# Patient Record
Sex: Female | Born: 1960 | Race: Black or African American | Hispanic: No | Marital: Single | Smoking: Former smoker
Health system: Southern US, Community
[De-identification: ages and names within clinical notes are randomized; demographics above are authoritative.]

## PROBLEM LIST (undated history)

## (undated) DIAGNOSIS — R531 Weakness: Secondary | ICD-10-CM

## (undated) DIAGNOSIS — I639 Cerebral infarction, unspecified: Secondary | ICD-10-CM

## (undated) DIAGNOSIS — R7303 Prediabetes: Secondary | ICD-10-CM

## (undated) DIAGNOSIS — E876 Hypokalemia: Secondary | ICD-10-CM

## (undated) DIAGNOSIS — I219 Acute myocardial infarction, unspecified: Secondary | ICD-10-CM

## (undated) DIAGNOSIS — N390 Urinary tract infection, site not specified: Secondary | ICD-10-CM

## (undated) DIAGNOSIS — I5042 Chronic combined systolic (congestive) and diastolic (congestive) heart failure: Secondary | ICD-10-CM

## (undated) DIAGNOSIS — E785 Hyperlipidemia, unspecified: Secondary | ICD-10-CM

## (undated) DIAGNOSIS — C801 Malignant (primary) neoplasm, unspecified: Secondary | ICD-10-CM

## (undated) DIAGNOSIS — J9601 Acute respiratory failure with hypoxia: Secondary | ICD-10-CM

## (undated) DIAGNOSIS — I10 Essential (primary) hypertension: Secondary | ICD-10-CM

## (undated) DIAGNOSIS — N179 Acute kidney failure, unspecified: Secondary | ICD-10-CM

## (undated) HISTORY — DX: Acute myocardial infarction, unspecified: I21.9

## (undated) HISTORY — PX: OTHER SURGICAL HISTORY: SHX169

## (undated) HISTORY — DX: Weakness: R53.1

## (undated) HISTORY — DX: Hypokalemia: E87.6

## (undated) HISTORY — DX: Acute kidney failure, unspecified: N17.9

## (undated) HISTORY — DX: Acute respiratory failure with hypoxia: J96.01

## (undated) HISTORY — DX: Malignant (primary) neoplasm, unspecified: C80.1

## (undated) HISTORY — DX: Urinary tract infection, site not specified: N39.0

## (undated) HISTORY — DX: Chronic combined systolic (congestive) and diastolic (congestive) heart failure: I50.42

## (undated) HISTORY — DX: Cerebral infarction, unspecified: I63.9

---

## 2015-07-31 DIAGNOSIS — H25011 Cortical age-related cataract, right eye: Secondary | ICD-10-CM | POA: Insufficient documentation

## 2015-07-31 DIAGNOSIS — H43811 Vitreous degeneration, right eye: Secondary | ICD-10-CM

## 2015-07-31 DIAGNOSIS — H2513 Age-related nuclear cataract, bilateral: Secondary | ICD-10-CM | POA: Insufficient documentation

## 2015-07-31 DIAGNOSIS — H40053 Ocular hypertension, bilateral: Secondary | ICD-10-CM

## 2015-07-31 DIAGNOSIS — H4031X4 Glaucoma secondary to eye trauma, right eye, indeterminate stage: Secondary | ICD-10-CM

## 2015-07-31 HISTORY — DX: Cortical age-related cataract, right eye: H25.011

## 2015-07-31 HISTORY — DX: Vitreous degeneration, right eye: H43.811

## 2015-07-31 HISTORY — DX: Age-related nuclear cataract, bilateral: H25.13

## 2015-07-31 HISTORY — DX: Ocular hypertension, bilateral: H40.053

## 2015-07-31 HISTORY — DX: Glaucoma secondary to eye trauma, right eye, indeterminate stage: H40.31X4

## 2020-03-24 DIAGNOSIS — I6529 Occlusion and stenosis of unspecified carotid artery: Secondary | ICD-10-CM

## 2020-03-24 HISTORY — DX: Occlusion and stenosis of unspecified carotid artery: I65.29

## 2020-09-22 ENCOUNTER — Emergency Department (HOSPITAL_COMMUNITY): Admit: 2020-09-22 | Payer: Self-pay | Admitting: Cardiology

## 2020-09-22 ENCOUNTER — Encounter (HOSPITAL_COMMUNITY): Payer: Self-pay | Admitting: Cardiology

## 2020-09-22 ENCOUNTER — Inpatient Hospital Stay (HOSPITAL_COMMUNITY): Payer: Medicaid Other

## 2020-09-22 ENCOUNTER — Inpatient Hospital Stay (HOSPITAL_COMMUNITY)
Admission: EM | Admit: 2020-09-22 | Discharge: 2020-09-24 | DRG: 246 | Disposition: A | Discharge status: Home / Self Care | Payer: Medicaid Other | Source: Other Acute Inpatient Hospital | Attending: Cardiology | Admitting: Cardiology

## 2020-09-22 ENCOUNTER — Encounter (HOSPITAL_COMMUNITY)
Admission: EM | Disposition: A | Discharge status: Home / Self Care | Payer: Self-pay | Source: Other Acute Inpatient Hospital | Attending: Cardiology

## 2020-09-22 DIAGNOSIS — E871 Hypo-osmolality and hyponatremia: Secondary | ICD-10-CM | POA: Diagnosis present

## 2020-09-22 DIAGNOSIS — E119 Type 2 diabetes mellitus without complications: Secondary | ICD-10-CM

## 2020-09-22 DIAGNOSIS — Z95820 Peripheral vascular angioplasty status with implants and grafts: Secondary | ICD-10-CM

## 2020-09-22 DIAGNOSIS — Z955 Presence of coronary angioplasty implant and graft: Secondary | ICD-10-CM

## 2020-09-22 DIAGNOSIS — I5041 Acute combined systolic (congestive) and diastolic (congestive) heart failure: Secondary | ICD-10-CM | POA: Diagnosis present

## 2020-09-22 DIAGNOSIS — Z8249 Family history of ischemic heart disease and other diseases of the circulatory system: Secondary | ICD-10-CM

## 2020-09-22 DIAGNOSIS — I255 Ischemic cardiomyopathy: Secondary | ICD-10-CM | POA: Diagnosis present

## 2020-09-22 DIAGNOSIS — E785 Hyperlipidemia, unspecified: Secondary | ICD-10-CM

## 2020-09-22 DIAGNOSIS — F17210 Nicotine dependence, cigarettes, uncomplicated: Secondary | ICD-10-CM | POA: Diagnosis present

## 2020-09-22 DIAGNOSIS — Z720 Tobacco use: Secondary | ICD-10-CM | POA: Diagnosis present

## 2020-09-22 DIAGNOSIS — I110 Hypertensive heart disease with heart failure: Secondary | ICD-10-CM | POA: Diagnosis present

## 2020-09-22 DIAGNOSIS — I2102 ST elevation (STEMI) myocardial infarction involving left anterior descending coronary artery: Principal | ICD-10-CM

## 2020-09-22 DIAGNOSIS — E1165 Type 2 diabetes mellitus with hyperglycemia: Secondary | ICD-10-CM | POA: Diagnosis present

## 2020-09-22 DIAGNOSIS — Z20822 Contact with and (suspected) exposure to covid-19: Secondary | ICD-10-CM | POA: Diagnosis present

## 2020-09-22 DIAGNOSIS — R9389 Abnormal findings on diagnostic imaging of other specified body structures: Secondary | ICD-10-CM

## 2020-09-22 DIAGNOSIS — I2510 Atherosclerotic heart disease of native coronary artery without angina pectoris: Secondary | ICD-10-CM

## 2020-09-22 DIAGNOSIS — R7401 Elevation of levels of liver transaminase levels: Secondary | ICD-10-CM | POA: Diagnosis present

## 2020-09-22 DIAGNOSIS — I10 Essential (primary) hypertension: Secondary | ICD-10-CM

## 2020-09-22 HISTORY — DX: Hyperlipidemia, unspecified: E78.5

## 2020-09-22 HISTORY — DX: Essential (primary) hypertension: I10

## 2020-09-22 HISTORY — PX: CORONARY/GRAFT ACUTE MI REVASCULARIZATION: CATH118305

## 2020-09-22 HISTORY — DX: Prediabetes: R73.03

## 2020-09-22 HISTORY — DX: ST elevation (STEMI) myocardial infarction involving left anterior descending coronary artery: I21.02

## 2020-09-22 HISTORY — PX: LEFT HEART CATH AND CORONARY ANGIOGRAPHY: CATH118249

## 2020-09-22 LAB — RESP PANEL BY RT-PCR (FLU A&B, COVID) ARPGX2
Influenza A by PCR: NEGATIVE
Influenza B by PCR: NEGATIVE
SARS Coronavirus 2 by RT PCR: NEGATIVE

## 2020-09-22 LAB — POCT I-STAT, CHEM 8
BUN: 18 mg/dL (ref 6–20)
Calcium, Ion: 1.19 mmol/L (ref 1.15–1.40)
Chloride: 103 mmol/L (ref 98–111)
Creatinine, Ser: 0.8 mg/dL (ref 0.44–1.00)
Glucose, Bld: 264 mg/dL — ABNORMAL HIGH (ref 70–99)
HCT: 41 % (ref 36.0–46.0)
Hemoglobin: 13.9 g/dL (ref 12.0–15.0)
Potassium: 3.8 mmol/L (ref 3.5–5.1)
Sodium: 137 mmol/L (ref 135–145)
TCO2: 22 mmol/L (ref 22–32)

## 2020-09-22 LAB — HEMOGLOBIN A1C
Hgb A1c MFr Bld: 6.9 % — ABNORMAL HIGH (ref 4.8–5.6)
Mean Plasma Glucose: 151.33 mg/dL

## 2020-09-22 LAB — POCT ACTIVATED CLOTTING TIME
Activated Clotting Time: 561 seconds
Activated Clotting Time: 822 seconds

## 2020-09-22 LAB — GLUCOSE, CAPILLARY
Glucose-Capillary: 184 mg/dL — ABNORMAL HIGH (ref 70–99)
Glucose-Capillary: 218 mg/dL — ABNORMAL HIGH (ref 70–99)
Glucose-Capillary: 197 mg/dL — ABNORMAL HIGH (ref 70–99)

## 2020-09-22 LAB — CBC
HCT: 41 % (ref 36.0–46.0)
Hemoglobin: 13.9 g/dL (ref 12.0–15.0)
MCH: 31.6 pg (ref 26.0–34.0)
MCHC: 33.9 g/dL (ref 30.0–36.0)
MCV: 93.2 fL (ref 80.0–100.0)
Platelets: 337 10*3/uL (ref 150–400)
RBC: 4.4 MIL/uL (ref 3.87–5.11)
RDW: 13.8 % (ref 11.5–15.5)
WBC: 11.7 10*3/uL — ABNORMAL HIGH (ref 4.0–10.5)
nRBC: 0 % (ref 0.0–0.2)

## 2020-09-22 LAB — ECHOCARDIOGRAM COMPLETE
Area-P 1/2: 6.65 cm2
S' Lateral: 4.4 cm
Single Plane A4C EF: 42.4 %

## 2020-09-22 LAB — CREATININE, SERUM
Creatinine, Ser: 1.1 mg/dL — ABNORMAL HIGH (ref 0.44–1.00)
GFR, Estimated: 58 mL/min — ABNORMAL LOW (ref >60)

## 2020-09-22 LAB — TROPONIN I (HIGH SENSITIVITY): Troponin I (High Sensitivity): >24000 ng/L (ref <18)

## 2020-09-22 LAB — HIV ANTIBODY (ROUTINE TESTING W REFLEX): HIV Screen 4th Generation wRfx: NONREACTIVE

## 2020-09-22 LAB — MRSA NEXT GEN BY PCR, NASAL: MRSA by PCR Next Gen: NOT DETECTED

## 2020-09-22 SURGERY — CORONARY/GRAFT ACUTE MI REVASCULARIZATION
Anesthesia: LOCAL

## 2020-09-22 MED ORDER — HEPARIN (PORCINE) IN NACL 1000-0.9 UT/500ML-% IV SOLN
INTRAVENOUS | Status: DC | PRN
  Administered 2020-09-22 (×2): 500 mL

## 2020-09-22 MED ORDER — LIDOCAINE HCL (PF) 1 % IJ SOLN
INTRAMUSCULAR | Status: AC
  Filled 2020-09-22: qty 30

## 2020-09-22 MED ORDER — ACETAMINOPHEN 325 MG PO TABS
650.0000 mg | ORAL_TABLET | ORAL | Status: DC | PRN
  Administered 2020-09-22 – 2020-09-23 (×3): 650 mg via ORAL
  Filled 2020-09-22 (×3): qty 2

## 2020-09-22 MED ORDER — HEPARIN (PORCINE) IN NACL 1000-0.9 UT/500ML-% IV SOLN
INTRAVENOUS | Status: AC
  Filled 2020-09-22: qty 1000

## 2020-09-22 MED ORDER — NITROGLYCERIN IN D5W 200-5 MCG/ML-% IV SOLN: 0.0000 ug/min | INTRAVENOUS | Status: DC

## 2020-09-22 MED ORDER — ONDANSETRON HCL 4 MG/2ML IJ SOLN: 4.0000 mg | Freq: Four times a day (QID) | INTRAMUSCULAR | Status: DC | PRN

## 2020-09-22 MED ORDER — HYDRALAZINE HCL 20 MG/ML IJ SOLN
10.0000 mg | INTRAMUSCULAR | Status: AC | PRN
  Administered 2020-09-22 (×2): 10 mg via INTRAVENOUS
  Filled 2020-09-22: qty 1

## 2020-09-22 MED ORDER — LIDOCAINE HCL (PF) 1 % IJ SOLN
INTRAMUSCULAR | Status: DC | PRN
  Administered 2020-09-22: 2 mL

## 2020-09-22 MED ORDER — VERAPAMIL HCL 2.5 MG/ML IV SOLN
INTRAVENOUS | Status: AC
  Filled 2020-09-22: qty 2

## 2020-09-22 MED ORDER — ASPIRIN EC 81 MG PO TBEC
81.0000 mg | DELAYED_RELEASE_TABLET | Freq: Every day | ORAL | Status: DC
  Administered 2020-09-23 – 2020-09-24 (×2): 81 mg via ORAL
  Filled 2020-09-22 (×2): qty 1

## 2020-09-22 MED ORDER — ALUM & MAG HYDROXIDE-SIMETH 200-200-20 MG/5ML PO SUSP
30.0000 mL | ORAL | Status: DC | PRN
  Administered 2020-09-22 – 2020-09-24 (×4): 30 mL via ORAL
  Filled 2020-09-22 (×4): qty 30

## 2020-09-22 MED ORDER — NITROGLYCERIN 1 MG/10 ML FOR IR/CATH LAB
INTRA_ARTERIAL | Status: DC | PRN
Start: 2020-09-22 — End: 2020-09-22
  Administered 2020-09-22 (×3): 200 ug via INTRACORONARY

## 2020-09-22 MED ORDER — HEPARIN SODIUM (PORCINE) 1000 UNIT/ML IJ SOLN
INTRAMUSCULAR | Status: AC
  Filled 2020-09-22: qty 1

## 2020-09-22 MED ORDER — IOHEXOL 350 MG/ML SOLN
INTRAVENOUS | Status: DC | PRN
  Administered 2020-09-22: 100 mL

## 2020-09-22 MED ORDER — NITROGLYCERIN IN D5W 200-5 MCG/ML-% IV SOLN
INTRAVENOUS | Status: AC
  Filled 2020-09-22: qty 250

## 2020-09-22 MED ORDER — CHLORHEXIDINE GLUCONATE CLOTH 2 % EX PADS
6.0000 | MEDICATED_PAD | Freq: Every day | CUTANEOUS | Status: DC
  Administered 2020-09-23: 6 via TOPICAL

## 2020-09-22 MED ORDER — HEPARIN SODIUM (PORCINE) 5000 UNIT/ML IJ SOLN: 5000.0000 [IU] | Freq: Three times a day (TID) | INTRAMUSCULAR | Status: DC

## 2020-09-22 MED ORDER — SODIUM CHLORIDE 0.9% FLUSH
3.0000 mL | Freq: Two times a day (BID) | INTRAVENOUS | Status: DC
  Administered 2020-09-22 – 2020-09-24 (×5): 3 mL via INTRAVENOUS

## 2020-09-22 MED ORDER — VERAPAMIL HCL 2.5 MG/ML IV SOLN
INTRAVENOUS | Status: DC | PRN
  Administered 2020-09-22: 10 mL via INTRA_ARTERIAL

## 2020-09-22 MED ORDER — INSULIN ASPART 100 UNIT/ML IJ SOLN
0.0000 [IU] | Freq: Three times a day (TID) | INTRAMUSCULAR | Status: DC
  Administered 2020-09-22: 3 [IU] via SUBCUTANEOUS
  Administered 2020-09-22 – 2020-09-23 (×4): 2 [IU] via SUBCUTANEOUS
  Administered 2020-09-24: 1 [IU] via SUBCUTANEOUS

## 2020-09-22 MED ORDER — CARVEDILOL 12.5 MG PO TABS
12.5000 mg | ORAL_TABLET | Freq: Two times a day (BID) | ORAL | Status: DC
  Administered 2020-09-22 – 2020-09-24 (×4): 12.5 mg via ORAL
  Filled 2020-09-22 (×4): qty 1

## 2020-09-22 MED ORDER — HEPARIN SODIUM (PORCINE) 1000 UNIT/ML IJ SOLN
INTRAMUSCULAR | Status: DC | PRN
Start: 2020-09-22 — End: 2020-09-22
  Administered 2020-09-22: 8000 [IU] via INTRAVENOUS

## 2020-09-22 MED ORDER — SODIUM CHLORIDE 0.9 % IV SOLN: INTRAVENOUS | Status: AC

## 2020-09-22 MED ORDER — HEPARIN SODIUM (PORCINE) 5000 UNIT/ML IJ SOLN
5000.0000 [IU] | Freq: Three times a day (TID) | INTRAMUSCULAR | Status: DC
  Administered 2020-09-22 – 2020-09-24 (×5): 5000 [IU] via SUBCUTANEOUS
  Filled 2020-09-22 (×5): qty 1

## 2020-09-22 MED ORDER — TICAGRELOR 90 MG PO TABS
ORAL_TABLET | ORAL | Status: DC | PRN
  Administered 2020-09-22: 180 mg via ORAL

## 2020-09-22 MED ORDER — TICAGRELOR 90 MG PO TABS
90.0000 mg | ORAL_TABLET | Freq: Two times a day (BID) | ORAL | Status: DC
  Administered 2020-09-22 – 2020-09-24 (×4): 90 mg via ORAL
  Filled 2020-09-22 (×4): qty 1

## 2020-09-22 MED ORDER — NITROGLYCERIN 0.4 MG SL SUBL: 0.4000 mg | SUBLINGUAL_TABLET | SUBLINGUAL | Status: DC | PRN

## 2020-09-22 MED ORDER — LABETALOL HCL 5 MG/ML IV SOLN
10.0000 mg | INTRAVENOUS | Status: AC | PRN
  Administered 2020-09-22 (×2): 10 mg via INTRAVENOUS
  Filled 2020-09-22 (×2): qty 4

## 2020-09-22 MED ORDER — NITROGLYCERIN IN D5W 200-5 MCG/ML-% IV SOLN
INTRAVENOUS | Status: DC | PRN
  Administered 2020-09-22: 10 ug/min via INTRAVENOUS

## 2020-09-22 MED ORDER — SODIUM CHLORIDE 0.9% FLUSH: 3.0000 mL | INTRAVENOUS | Status: DC | PRN

## 2020-09-22 MED ORDER — NITROGLYCERIN 1 MG/10 ML FOR IR/CATH LAB
INTRA_ARTERIAL | Status: AC
  Filled 2020-09-22: qty 10

## 2020-09-22 MED ORDER — ATORVASTATIN CALCIUM 80 MG PO TABS
80.0000 mg | ORAL_TABLET | Freq: Every day | ORAL | Status: DC
  Administered 2020-09-22 – 2020-09-23 (×2): 80 mg via ORAL
  Filled 2020-09-22 (×2): qty 1

## 2020-09-22 MED ORDER — SODIUM CHLORIDE 0.9 % IV SOLN: 250.0000 mL | INTRAVENOUS | Status: DC | PRN

## 2020-09-22 SURGICAL SUPPLY — 15 items
BALLN SAPPHIRE 2.0X12 (BALLOONS) ×2
BALLN SAPPHIRE ~~LOC~~ 3.0X15 (BALLOONS) ×2 IMPLANT
BALLOON SAPPHIRE 2.0X12 (BALLOONS) ×1 IMPLANT
CATH INFINITI JR4 5F (CATHETERS) ×2 IMPLANT
CATH VISTA GUIDE 6FR XBLAD3.5 (CATHETERS) ×2 IMPLANT
DEVICE RAD TR BAND REGULAR (VASCULAR PRODUCTS) ×2 IMPLANT
GLIDESHEATH SLEND SS 6F .021 (SHEATH) ×2 IMPLANT
KIT HEART LEFT (KITS) ×2 IMPLANT
PACK CARDIAC CATHETERIZATION (CUSTOM PROCEDURE TRAY) ×2 IMPLANT
STENT ONYX FRONTIER 2.5X38 (Permanent Stent) ×2 IMPLANT
TRANSDUCER W/STOPCOCK (MISCELLANEOUS) ×2 IMPLANT
TUBING CIL FLEX 10 FLL-RA (TUBING) ×2 IMPLANT
WIRE ASAHI PROWATER 180CM (WIRE) ×2 IMPLANT
WIRE HI TORQ VERSACORE J 260CM (WIRE) ×2 IMPLANT
WIRE STARTER ROSEN .035X260 (WIRE) ×2 IMPLANT

## 2020-09-22 NOTE — Progress Notes (Signed)
1100-1130 Reviewed pt's chart. Put in CRP 2 order for [place]. Left MI book, heart healthy diet, tobacco cessation handout at bedside. Pt is fast asleep and just had PCI.  Will continue to follow. Graylon Good RN BSN 09/22/2020 11:32 AM

## 2020-09-22 NOTE — H&P (Signed)
Cardiology Admission History and Physical:   Patient ID: Tracey Meyer MRN: [id number]; DOB: [birth year]   Admission date: 09/22/2020  PCP:  Pcp, No   [facility] Providers Cardiologist:  None   New     Chief Complaint:  upper abdominal pain  Patient Profile:   Tracey Meyer is a 60 y.o. female with history of HTN, HLD untreated  who is transferred from [facility] ED 09/22/2020 for the evaluation of anterior STEMI.  History of Present Illness:   Tracey Meyer has a history of HTN, HLD, and "borderline DM". Hasn't seen a doctor in years and has not been on any medication. Yesterday 7 pm she developed severe upper abdominal pain associated with nausea, vomiting, diarrhea and diaphoresis. Pain was constant and unremitting. Presented to [facility] hospital ED this am and Ecg was consistent with anterior STEMI. She was severely hypertensive with BP 215/110. Given ASA, IV heparin, IV lopressor and transferred here for emergent cardiac cath. Denies any prior history of heart disease. She does smoke 1/2 pk/day. States her brother has some type of heart disease.   Past Medical History:  Diagnosis Date   HTN (hypertension)    Hyperlipidemia    Prediabetes     Past Surgical History:  Procedure Laterality Date   BREAST SURGERY Left    age 61     Medications Prior to Admission: Prior to Admission medications   Not on File     Allergies:   Not on File  Social History:   Social History   Socioeconomic History   Marital status: Single    Spouse name: Not on file   Number of children: Not on file   Years of education: Not on file   Highest education level: Not on file  Occupational History   Not on file  Tobacco Use   Smoking status: Every Day    Packs/day: 0.50    Pack years: 0.00    Types: Cigarettes   Smokeless tobacco: Never  Substance and Sexual Activity   Alcohol use: Not on file   Drug use: Never   Sexual activity: Not on file  Other Topics Concern    Not on file  Social History Narrative   Not on file   Social Determinants of Health   Financial Resource Strain: Not on file  Food Insecurity: Not on file  Transportation Needs: Not on file  Physical Activity: Not on file  Stress: Not on file  Social Connections: Not on file  Intimate Partner Violence: Not on file    Family History:   The patient's family history includes Cancer in her mother; Heart disease in her brother.    ROS:  Please see the history of present illness.  All other ROS reviewed and negative.     Physical Exam/Data:   Vitals:   09/22/20 1016 09/22/20 1021 09/22/20 1025 09/22/20 1030  BP: (!) 180/99     Pulse: 85 (!) 0 (!) 224 (!) 115  Resp: (!) 26 (!) 0 (!) 0 (!) 0  SpO2: 93% (!) 0% (!) 0% (!) 0%   No intake or output data in the 24 hours ending 09/22/20 1036 No flowsheet data found.   There is no height or weight on file to calculate BMI.  General:  Well nourished, well developed, in no acute distress HEENT: normal Lymph: no adenopathy Neck: no JVD Endocrine:  No thryomegaly Vascular: No carotid bruits; FA pulses 2+ bilaterally without bruits  Cardiac:  normal S1, S2; RRR;  no murmur  Lungs:  clear to auscultation bilaterally, no wheezing, rhonchi or rales  Abd: soft, nontender, no hepatomegaly  Ext: no edema Musculoskeletal:  No deformities, BUE and BLE strength normal and equal Skin: warm and dry  Neuro:  CNs 2-12 intact, no focal abnormalities noted Psych:  Normal affect    EKG:  The ECG that was done today was personally reviewed and demonstrates NSR with LVH, ST elevation 2 mm in leads 1 and 2, 4 mm ST elevation V 2-5 c/w anterior STEMI  Relevant CV Studies: none  Laboratory Data:  High Sensitivity Troponin:  No results for input(s): TROPONINIHS in the last 720 hours.    Chemistry Recent Labs  Lab 09/22/20 0940  NA 137  K 3.8  CL 103  GLUCOSE 264*  BUN 18  CREATININE 0.80    No results for input(s): PROT, ALBUMIN, AST, ALT,  ALKPHOS, BILITOT in the last 168 hours. Hematology Recent Labs  Lab 09/22/20 0940  HGB 13.9  HCT 41.0   BNPNo results for input(s): BNP, PROBNP in the last 168 hours.  DDimer No results for input(s): DDIMER in the last 168 hours.   Radiology/Studies:  CARDIAC CATHETERIZATION  Result Date: 09/22/2020  RPDA lesion is 50% stenosed.  Dist Cx lesion is 30% stenosed with 30% stenosed side branch in LPAV.  1st Diag lesion is 60% stenosed.  Mid LAD lesion is 100% stenosed.  Post intervention, there is a 0% residual stenosis.  A drug-eluting stent was successfully placed using a STENT ONYX FRONTIER 2.5X38.  LV end diastolic pressure is mildly elevated.  1. 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 Mildly elevated LVEDP 21 mmHg 3. Successful PCI of the mid LAD with DES x 1 Plan: DAPT for one year. I suspect she will have significant myocardial injury given late presentation. Will check Echo. Aggressive risk factor modification.     Assessment and Plan:   Acute anterior STEMI. Unfortunately she is presenting late- 14 hours after onset of pain. She has marked ST elevation in the precordial leads with Q waves. She is having continued pain. Will proceed directly to emergent cardiac cath with likely PCI. Will need DAPT, beta blocker, and statin.  Severe uncontrolled HTN. Will initiate beta blocker and nitrate therapy. Consider adding ARB if renal function is Ok. May need to consider Entresto if EF is severely reduced. Hyperglycemia with history of "borderline DM". Will check A1c. SSI Hyperlipidemia. Check lipid panel. Start high dose lipitor Tobacco abuse. Counsel on smoking cessation.    Risk Assessment/Risk Scores:    TIMI Risk Score for ST  Elevation MI:   The patient's TIMI risk score is 3, which indicates a 4.4% risk of all cause mortality at 30 days.        Severity of Illness: The appropriate patient status for this patient is INPATIENT. Inpatient status is judged  to be reasonable and necessary in order to provide the required intensity of service to ensure the patient's safety. The patient's presenting symptoms, physical exam findings, and initial radiographic and laboratory data in the context of their chronic comorbidities is felt to place them at high risk for further clinical deterioration. Furthermore, it is not anticipated that the patient will be medically stable for discharge from the hospital within 2 midnights of admission. The following factors support the patient status of inpatient.   " The patient's presenting symptoms include upper abdominal pain. " The worrisome physical exam findings include severe HTN. " The initial  radiographic and laboratory data are worrisome because of ST elevation on Ecg. " The chronic co-morbidities include HTN, hyperglycemia, HLD, tobacco abuse.   * I certify that at the point of admission it is my clinical judgment that the patient will require inpatient hospital care spanning beyond 2 midnights from the point of admission due to high intensity of service, high risk for further deterioration and high frequency of surveillance required.*   For questions or updates, please contact [place] Please consult [email or web address] for contact info under     Signed, [place] Grayer Martinique, MD  09/22/2020 10:36 AM

## 2020-09-22 NOTE — Plan of Care (Signed)
Post cath lab education ongoing for patient

## 2020-09-22 NOTE — Progress Notes (Signed)
  Echocardiogram 2D Echocardiogram has been performed.  Tracey Meyer 09/22/2020, 12:16 PM

## 2020-09-22 NOTE — Progress Notes (Signed)
   09/22/20 0834  Clinical Encounter Type  Visited With Patient not available  Visit Type Other (Comment) (Code Stemi)  Referral From Nurse  Consult/Referral To Chaplain    Chaplain responded to code stemi. Patient is not available and Chaplain remains available if needed. This note was prepared by Jeanine Luz, M.Div..  For questions please contact by phone [phone].

## 2020-09-23 ENCOUNTER — Inpatient Hospital Stay (HOSPITAL_COMMUNITY): Payer: Medicaid Other

## 2020-09-23 LAB — CBC
HCT: 38.3 % (ref 36.0–46.0)
Hemoglobin: 12.7 g/dL (ref 12.0–15.0)
MCH: 30.9 pg (ref 26.0–34.0)
MCHC: 33.2 g/dL (ref 30.0–36.0)
MCV: 93.2 fL (ref 80.0–100.0)
Platelets: 249 10*3/uL (ref 150–400)
RBC: 4.11 MIL/uL (ref 3.87–5.11)
RDW: 13.8 % (ref 11.5–15.5)
WBC: 12.9 10*3/uL — ABNORMAL HIGH (ref 4.0–10.5)
nRBC: 0 % (ref 0.0–0.2)

## 2020-09-23 LAB — BASIC METABOLIC PANEL
Anion gap: 10 (ref 5–15)
BUN: 28 mg/dL — ABNORMAL HIGH (ref 6–20)
CO2: 22 mmol/L (ref 22–32)
Calcium: 9.7 mg/dL (ref 8.9–10.3)
Chloride: 100 mmol/L (ref 98–111)
Creatinine, Ser: 1.14 mg/dL — ABNORMAL HIGH (ref 0.44–1.00)
GFR, Estimated: 55 mL/min — ABNORMAL LOW (ref >60)
Glucose, Bld: 194 mg/dL — ABNORMAL HIGH (ref 70–99)
Potassium: 3.9 mmol/L (ref 3.5–5.1)
Sodium: 132 mmol/L — ABNORMAL LOW (ref 135–145)

## 2020-09-23 LAB — LIPID PANEL
Cholesterol: 234 mg/dL — ABNORMAL HIGH (ref 0–200)
HDL: 79 mg/dL (ref >40)
LDL Cholesterol: 138 mg/dL — ABNORMAL HIGH (ref 0–99)
Total CHOL/HDL Ratio: 3 RATIO
Triglycerides: 87 mg/dL (ref <150)
VLDL: 17 mg/dL (ref 0–40)

## 2020-09-23 LAB — GLUCOSE, CAPILLARY
Glucose-Capillary: 176 mg/dL — ABNORMAL HIGH (ref 70–99)
Glucose-Capillary: 170 mg/dL — ABNORMAL HIGH (ref 70–99)

## 2020-09-23 IMAGING — CR DG CHEST 2V
2 series · 2 of 2 positions shown · non-contrast
Comparison: Radiograph yesterday at [facility].

CLINICAL DATA: Question right lung pneumonia on outside radiograph.

EXAM:
CHEST - 2 VIEW

[chest lat]
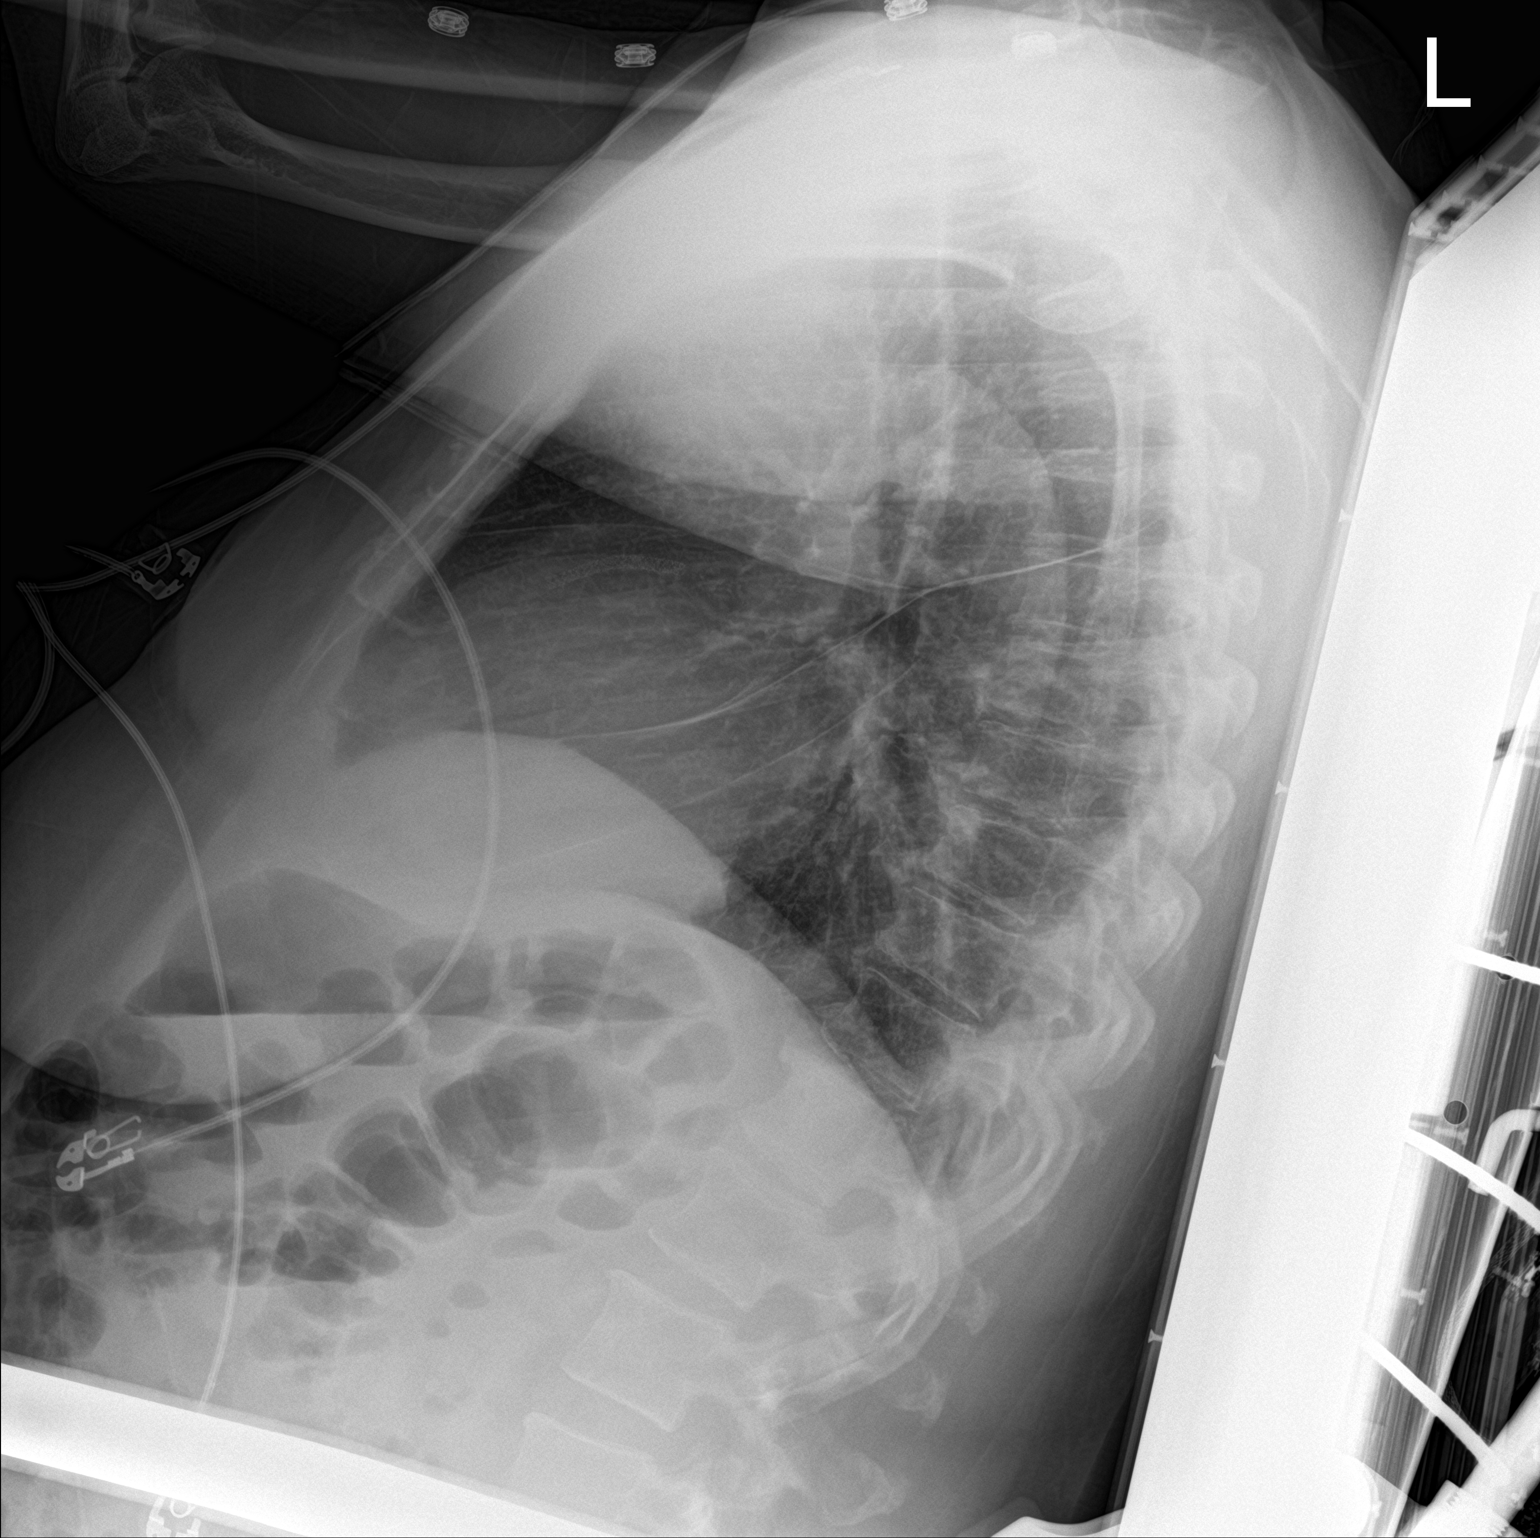

[chest ap]
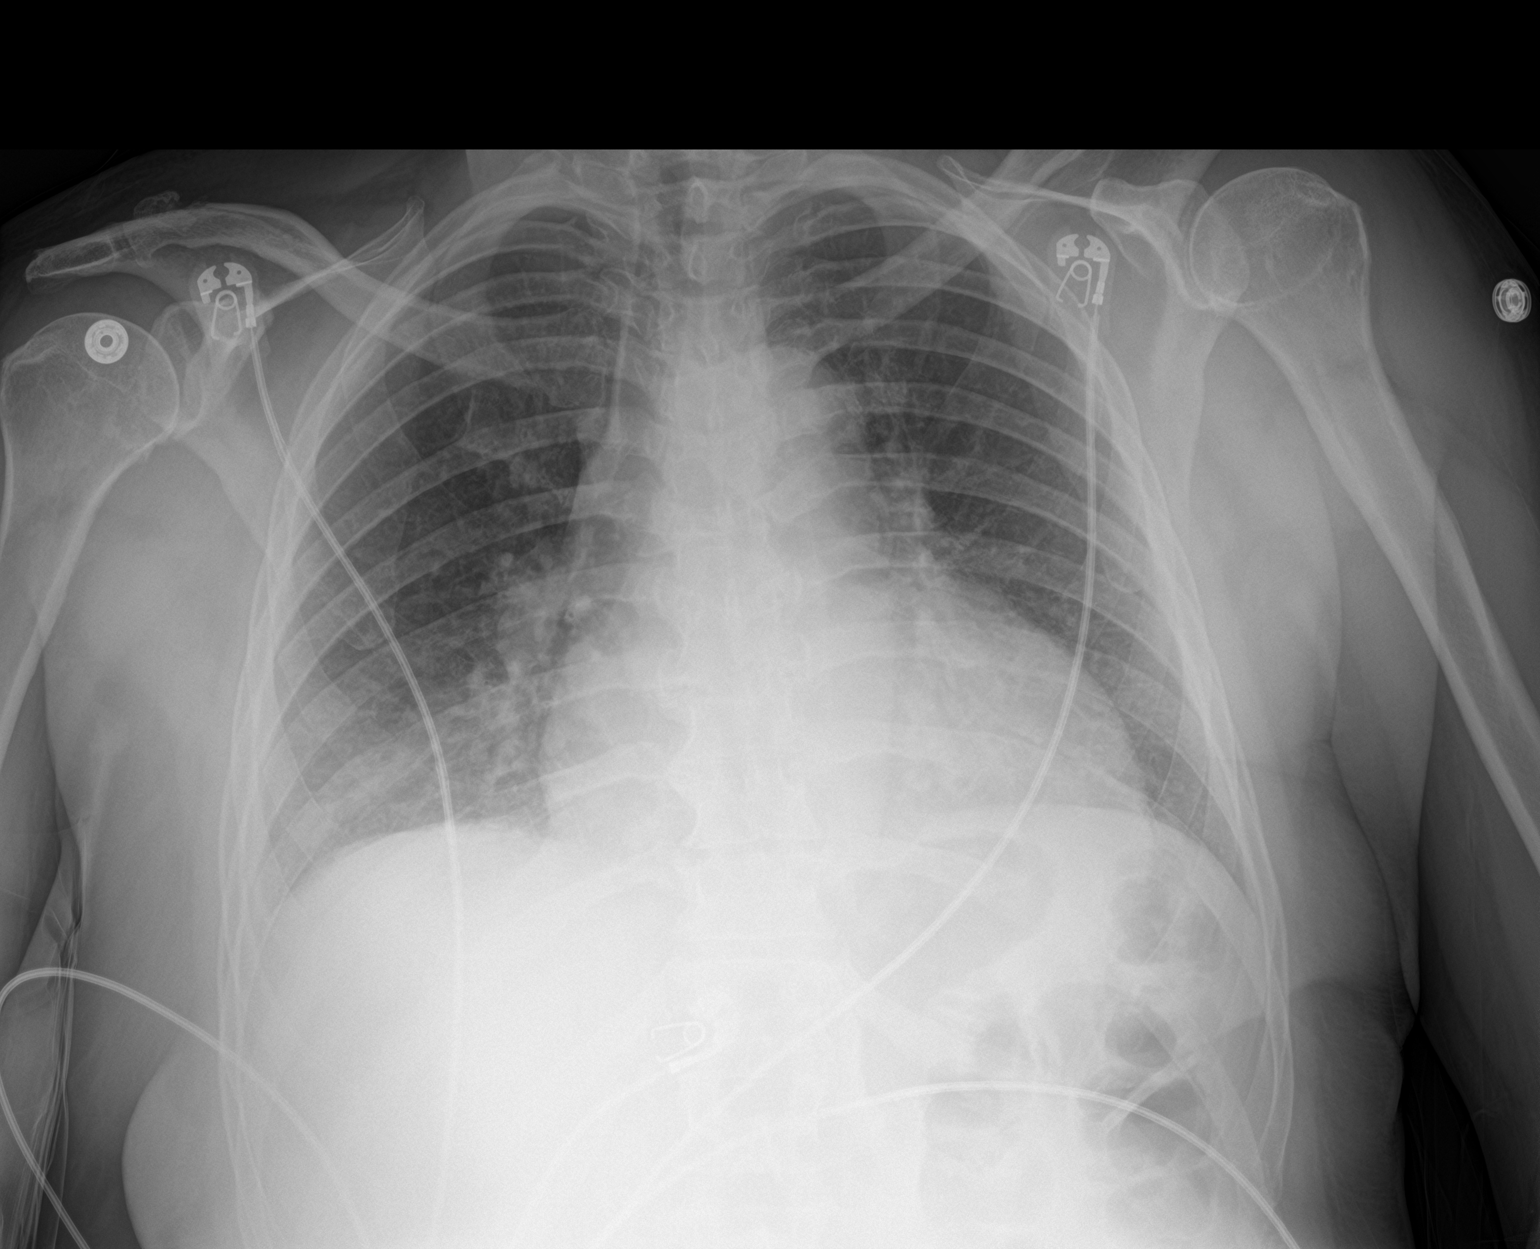

[2 of 2 positions shown; findings below may reference images not displayed]

FINDINGS: Lung volumes are low. Borderline cardiomegaly is similar. There is
improved right basilar aeration with mild residual atelectasis. No
new airspace disease. There is minimal fluid in the fissures without
large subpulmonic effusion. No pneumothorax. Stable osseous
structures.
IMPRESSION: 1. Improved aeration at the right lung base with mild residual
atelectasis. No new airspace disease.
2. Trace fluid in the fissures.  Borderline cardiomegaly.

## 2020-09-23 MED ORDER — SPIRONOLACTONE 25 MG PO TABS
25.0000 mg | ORAL_TABLET | Freq: Every day | ORAL | Status: DC
  Administered 2020-09-23 – 2020-09-24 (×2): 25 mg via ORAL
  Filled 2020-09-23 (×2): qty 1

## 2020-09-23 MED ORDER — SACUBITRIL-VALSARTAN 24-26 MG PO TABS
1.0000 | ORAL_TABLET | Freq: Two times a day (BID) | ORAL | Status: DC
  Administered 2020-09-23 – 2020-09-24 (×3): 1 via ORAL
  Filled 2020-09-23 (×3): qty 1

## 2020-09-23 NOTE — Progress Notes (Addendum)
Dr. Martinique had asked me to f/u on the labs sent over by [facility] ER yesterday. Records reviewed. CBC and coags are available, no acute abnormalities. WBC 9.4, Hgb 15.1, Plt 348, PT/PTT/INR WNL. CMET/lipase were pending at the time of her transfer here per the report available and CXR result was not sent. She had a CT of the abdomen listed as "ordered" at their facility since presenting complaint was abdominal pain, but upon recognition of STEMI, this was deferred (confirmed with [facility] ER this AM it was not performed, so review of this result is not needed). [facility] team will fax to cath lab.  Addendum: Additional results  reviewed. CMET 09/22/20 showed Na 135, K 4.1, Cr 0.80, BUN 18, glucose 253, calcium 10.2, albumin 4.9, TProtein 9.2, Troponin I - 114, NT-PBNP 6810, TBili 0.7, AST 628, ALT 84, Alk phos 101, Lipase 52. Portable CXR 09/22/20: low lung volumes, patchy airspace disease at the right base compatible with PNA. Cardiopericardial silhouette is at the upper limits of normal for size. The visualized bony structures of the thorax show no acute abnormality.  Per d/w Dr. Percival Spanish- - hold statin further with repeat LFTs ordered in AM. Abnormal AST may be 2/2 to acute MI but await improvement - repeat CXR as 2V to clarify finding, but clinically he doubts PNA   Addendum: 2V CXR still pending, will s/o to fellow to review. Nurse was planning on accompanying patient when she was transferred out to floor. Dr. Percival Spanish was OK with transfer if her BP was improved this afternoon, nurse reports order already in.  Minola Guin PA-C

## 2020-09-23 NOTE — Progress Notes (Signed)
Noted pt New to Entresto/Brilinta- pt can use drug manufacture 30 day free card to fill on discharge for these drugs. TOC will follow for any further medication assistance and need for MATCH.

## 2020-09-23 NOTE — Progress Notes (Addendum)
Progress Note  Patient Name: Tracey Meyer Date of Encounter: 09/23/2020  Primary Cardiologist:   None   Subjective   No chest pain, no SOB.  She has some pain she thinks where the Lovenox was injected  Inpatient Medications    Scheduled Meds:  aspirin EC  81 mg Oral Daily   atorvastatin  80 mg Oral Daily   carvedilol  12.5 mg Oral BID WC   Chlorhexidine Gluconate Cloth  6 each Topical Q0600   heparin  5,000 Units Subcutaneous Q8H   insulin aspart  0-9 Units Subcutaneous TID WC   sodium chloride flush  3 mL Intravenous Q12H   ticagrelor  90 mg Oral BID   Continuous Infusions:  sodium chloride     nitroGLYCERIN 30 mcg/min (09/23/20 0600)   PRN Meds: sodium chloride, acetaminophen, alum & mag hydroxide-simeth, nitroGLYCERIN, ondansetron (ZOFRAN) IV, sodium chloride flush   Vital Signs    Vitals:   09/23/20 0530 09/23/20 0600 09/23/20 0630 09/23/20 0700  BP: (!) 181/90 (!) 163/97 (!) 156/75   Pulse: 88 87 89   Resp: (!) 25 11 15    Temp:    98.2 F (36.8 C)  TempSrc:    Oral  SpO2: 96% 97% 97%     Intake/Output Summary (Last 24 hours) at 09/23/2020 0735 Last data filed at 09/23/2020 0600 Gross per 24 hour  Intake 494.58 ml  Output --  Net 494.58 ml   There were no vitals filed for this visit.  Telemetry    NSR - Personally Reviewed  ECG    NSR, anterior infarct acute with resolving ST elevation and progressive T wave inversion with prolonged QT, rate 85 - Personally Reviewed  Physical Exam   GEN: No acute distress.   Neck: No  JVD Cardiac: RRR, no murmurs, rubs, or gallops.  Respiratory: Clear  to auscultation bilaterally. GI: Soft, nontender, non-distended  MS: No edema; No deformity. Neuro:  Nonfocal  Psych: Normal affect   Labs    Chemistry Recent Labs  Lab 09/22/20 0940 09/22/20 1233 09/23/20 0134  NA 137  --  132*  K 3.8  --  3.9  CL 103  --  100  CO2  --   --  22  GLUCOSE 264*  --  194*  BUN 18  --  28*  CREATININE 0.80 1.10*  1.14*  CALCIUM  --   --  9.7  GFRNONAA  --  58* 55*  ANIONGAP  --   --  10     Hematology Recent Labs  Lab 09/22/20 0940 09/22/20 1233 09/23/20 0134  WBC  --  11.7* 12.9*  RBC  --  4.40 4.11  HGB 13.9 13.9 12.7  HCT 41.0 41.0 38.3  MCV  --  93.2 93.2  MCH  --  31.6 30.9  MCHC  --  33.9 33.2  RDW  --  13.8 13.8  PLT  --  337 249    Cardiac EnzymesNo results for input(s): TROPONINI in the last 168 hours. No results for input(s): TROPIPOC in the last 168 hours.   BNPNo results for input(s): BNP, PROBNP in the last 168 hours.   DDimer No results for input(s): DDIMER in the last 168 hours.   Radiology    CARDIAC CATHETERIZATION  Result Date: 09/22/2020  RPDA lesion is 50% stenosed.  Dist Cx lesion is 30% stenosed with 30% stenosed side branch in LPAV.  1st Diag lesion is 60% stenosed.  Mid LAD lesion is 100% stenosed.  Post intervention, there is a 0% residual stenosis.  A drug-eluting stent was successfully placed using a STENT ONYX FRONTIER 2.5X38.  LV end diastolic pressure is mildly elevated.  1. 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 Mildly elevated LVEDP 21 mmHg 3. Successful PCI of the mid LAD with DES x 1 Plan: DAPT for one year. I suspect she will have significant myocardial injury given late presentation. Will check Echo. Aggressive risk factor modification.   ECHOCARDIOGRAM COMPLETE  Result Date: 09/22/2020    ECHOCARDIOGRAM REPORT   Patient Name:   Tracey Meyer Date of Exam: 09/22/2020 Medical Rec #:  [id number]          Height: Accession #:    [id number]         Weight: Date of Birth:  [birth year]          BSA: Patient Age:    60 years           BP:           180/99 mmHg Patient Gender: F                  HR:           83 bpm. Exam Location:  Inpatient Procedure: 2D Echo, Cardiac Doppler and Color Doppler Indications:    acute MI  History:        Patient has no prior history of Echocardiogram examinations.                 Acute MI,  Signs/Symptoms:Chest Pain; Risk Factors:Hypertension,                 Dyslipidemia, Diabetes and Current Smoker.  Sonographer:    Dustin Flock Referring Phys: [id number] PETER M Martinique IMPRESSIONS  1. Apex well-visualized without contrast - false tendon (normal variant) - no thrombus. Left ventricular ejection fraction, by estimation, is 35 to 40%. The left ventricle has moderately decreased function. The left ventricle demonstrates regional wall motion abnormalities (see scoring diagram/findings for description). There is mild left ventricular hypertrophy. Left ventricular diastolic parameters are consistent with Grade I diastolic dysfunction (impaired relaxation). Elevated left ventricular end-diastolic pressure. There is severe hypokinesis of the left ventricular, entire anterior wall, anteroseptal wall, apical segment and inferoapical segment. Findings suggest LAD territory ischemia/infarct.  2. Right ventricular systolic function is normal. The right ventricular size is normal. There is normal pulmonary artery systolic pressure.  3. The mitral valve is abnormal. Mild mitral valve regurgitation.  4. The aortic valve is tricuspid. Aortic valve regurgitation is not visualized.  5. The inferior vena cava is normal in size with <50% respiratory variability, suggesting right atrial pressure of 8 mmHg. Comparison(s): No prior Echocardiogram. FINDINGS  Left Ventricle: Apex well-visualized without contrast - false tendon (normal variant) - no thrombus. Left ventricular ejection fraction, by estimation, is 35 to 40%. The left ventricle has moderately decreased function. The left ventricle demonstrates regional wall motion abnormalities. Severe hypokinesis of the left ventricular, entire anterior wall, anteroseptal wall, apical segment and inferoapical segment. The left ventricular internal cavity size was normal in size. There is mild left ventricular  hypertrophy. Left ventricular diastolic parameters are consistent with  Grade I diastolic dysfunction (impaired relaxation). Elevated left ventricular end-diastolic pressure.  LV Wall Scoring: The mid and distal anterior wall, mid and distal anterior septum, and entire apex are hypokinetic. The antero-lateral wall, inferior wall, posterior wall, basal anteroseptal segment, mid inferoseptal segment, basal anterior segment, and basal inferoseptal  segment are normal. Right Ventricle: The right ventricular size is normal. No increase in right ventricular wall thickness. Right ventricular systolic function is normal. There is normal pulmonary artery systolic pressure. The tricuspid regurgitant velocity is 2.58 m/s, and  with an assumed right atrial pressure of 8 mmHg, the estimated right ventricular systolic pressure is 57.8 mmHg. Left Atrium: Left atrial size was normal in size. Right Atrium: Right atrial size was normal in size. Pericardium: There is no evidence of pericardial effusion. Mitral Valve: The mitral valve is abnormal. There is mild thickening of the mitral valve leaflet(s). Mild mitral valve regurgitation. Tricuspid Valve: The tricuspid valve is grossly normal. Tricuspid valve regurgitation is trivial. Aortic Valve: The aortic valve is tricuspid. Aortic valve regurgitation is not visualized. Pulmonic Valve: The pulmonic valve was grossly normal. Pulmonic valve regurgitation is not visualized. Aorta: The aortic root and ascending aorta are structurally normal, with no evidence of dilitation. Venous: The inferior vena cava is normal in size with less than 50% respiratory variability, suggesting right atrial pressure of 8 mmHg. IAS/Shunts: No atrial level shunt detected by color flow Doppler.  LEFT VENTRICLE PLAX 2D LVIDd:         4.90 cm      Diastology LVIDs:         4.40 cm      LV e' medial:    4.13 cm/s LV PW:         1.20 cm      LV E/e' medial:  16.2 LV IVS:        1.00 cm      LV e' lateral:   3.48 cm/s LVOT diam:     2.10 cm      LV E/e' lateral: 19.3 LV SV:         49  LVOT Area:     3.46 cm  LV Volumes (MOD) LV vol d, MOD A4C: 131.0 ml LV vol s, MOD A4C: 75.5 ml LV SV MOD A4C:     131.0 ml RIGHT VENTRICLE RV Basal diam:  2.20 cm RV S prime:     9.57 cm/s TAPSE (M-mode): 1.8 cm LEFT ATRIUM             RIGHT ATRIUM LA diam:        3.80 cm RA Area:     10.10 cm LA Vol (A2C):   28.4 ml RA Volume:   19.60 ml LA Vol (A4C):   34.6 ml LA Biplane Vol: 31.7 ml  AORTIC VALVE LVOT Vmax:   83.50 cm/s LVOT Vmean:  54.400 cm/s LVOT VTI:    0.141 m  AORTA Ao Root diam: 2.70 cm MITRAL VALVE                TRICUSPID VALVE MV Area (PHT): 6.65 cm     TR Peak grad:   26.6 mmHg MV Decel Time: 114 msec     TR Vmax:        258.00 cm/s MV E velocity: 67.00 cm/s MV A velocity: 100.00 cm/s  SHUNTS MV E/A ratio:  0.67         Systemic VTI:  0.14 m                             Systemic Diam: 2.10 cm Lyman Bishop MD Electronically signed by Lyman Bishop MD Signature Date/Time: 09/22/2020/1:04:05 PM    Final     Cardiac Studies   ECHO:  1. Apex well-visualized without contrast - false tendon (normal variant)  - no thrombus. Left ventricular ejection fraction, by estimation, is 35 to  40%. The left ventricle has moderately decreased function. The left  ventricle demonstrates regional wall  motion abnormalities (see scoring diagram/findings for description). There  is mild left ventricular hypertrophy. Left ventricular diastolic  parameters are consistent with Grade I diastolic dysfunction (impaired  relaxation). Elevated left ventricular  end-diastolic pressure. There is severe hypokinesis of the left  ventricular, entire anterior wall, anteroseptal wall, apical segment and  inferoapical segment. Findings suggest LAD territory ischemia/infarct.   2. Right ventricular systolic function is normal. The right ventricular  size is normal. There is normal pulmonary artery systolic pressure.   3. The mitral valve is abnormal. Mild mitral valve regurgitation.   4. The aortic valve is tricuspid.  Aortic valve regurgitation is not  visualized.   5. The inferior vena cava is normal in size with <50% respiratory  variability, suggesting right atrial pressure of 8 mmHg.   Diagnostic Dominance: Co-dominant    Intervention       Patient Profile     60 y.o. female with history of HTN, HLD untreated  who is transferred from [facility] ED 09/22/2020 for the evaluation of anterior STEMI.  Assessment & Plan    ACUTE ANTERIOR MI:       Status post intervention above.  Continue DAPT. Other meds as below.   ISCHEMIC CARDIOMYOPATHY:  BP will allow initiation of Entresto.  Mild RI.  Follow creat closely.   Start spironolactone.  Will stop IV NTG once her pressure comes down.  She is self pay on meds and will need help  HYPERGLYCEMIA:     A1C 6.9.  Begin metformin before discharge.   She was not on any meds at admission.  Hold off on [place] for now.    HTN:  This is being managed in the context of treating his CHF  DYSLIPIDEMIA: Now on high dose statin.    HYPONATREMIA:  Follow.    For questions or updates, please contact [place] Please consult [email or web address] for contact info under Cardiology/STEMI.   Signed, Minus Breeding, MD  09/23/2020, 7:35 AM

## 2020-09-24 ENCOUNTER — Encounter (HOSPITAL_COMMUNITY): Payer: Self-pay | Admitting: Cardiology

## 2020-09-24 DIAGNOSIS — I255 Ischemic cardiomyopathy: Secondary | ICD-10-CM

## 2020-09-24 DIAGNOSIS — E7800 Pure hypercholesterolemia, unspecified: Secondary | ICD-10-CM

## 2020-09-24 DIAGNOSIS — R7401 Elevation of levels of liver transaminase levels: Secondary | ICD-10-CM

## 2020-09-24 DIAGNOSIS — Z95820 Peripheral vascular angioplasty status with implants and grafts: Secondary | ICD-10-CM

## 2020-09-24 DIAGNOSIS — I5041 Acute combined systolic (congestive) and diastolic (congestive) heart failure: Secondary | ICD-10-CM

## 2020-09-24 DIAGNOSIS — E119 Type 2 diabetes mellitus without complications: Secondary | ICD-10-CM

## 2020-09-24 DIAGNOSIS — Z720 Tobacco use: Secondary | ICD-10-CM | POA: Diagnosis present

## 2020-09-24 HISTORY — DX: Elevation of levels of liver transaminase levels: R74.01

## 2020-09-24 HISTORY — DX: Type 2 diabetes mellitus without complications: E11.9

## 2020-09-24 HISTORY — DX: Ischemic cardiomyopathy: I25.5

## 2020-09-24 HISTORY — DX: Tobacco use: Z72.0

## 2020-09-24 HISTORY — DX: Peripheral vascular angioplasty status with implants and grafts: Z95.820

## 2020-09-24 LAB — COMPREHENSIVE METABOLIC PANEL
ALT: 55 U/L — ABNORMAL HIGH (ref 0–44)
AST: 163 U/L — ABNORMAL HIGH (ref 15–41)
Albumin: 2.9 g/dL — ABNORMAL LOW (ref 3.5–5.0)
Alkaline Phosphatase: 48 U/L (ref 38–126)
Anion gap: 8 (ref 5–15)
BUN: 27 mg/dL — ABNORMAL HIGH (ref 6–20)
CO2: 21 mmol/L — ABNORMAL LOW (ref 22–32)
Calcium: 9.3 mg/dL (ref 8.9–10.3)
Chloride: 104 mmol/L (ref 98–111)
Creatinine, Ser: 1.09 mg/dL — ABNORMAL HIGH (ref 0.44–1.00)
GFR, Estimated: 59 mL/min — ABNORMAL LOW (ref >60)
Glucose, Bld: 142 mg/dL — ABNORMAL HIGH (ref 70–99)
Potassium: 3.4 mmol/L — ABNORMAL LOW (ref 3.5–5.1)
Sodium: 133 mmol/L — ABNORMAL LOW (ref 135–145)
Total Bilirubin: 1.1 mg/dL (ref 0.3–1.2)
Total Protein: 6.8 g/dL (ref 6.5–8.1)

## 2020-09-24 LAB — CBC
HCT: 38.5 % (ref 36.0–46.0)
Hemoglobin: 13.1 g/dL (ref 12.0–15.0)
MCH: 31.2 pg (ref 26.0–34.0)
MCHC: 34 g/dL (ref 30.0–36.0)
MCV: 91.7 fL (ref 80.0–100.0)
Platelets: 287 10*3/uL (ref 150–400)
RBC: 4.2 MIL/uL (ref 3.87–5.11)
RDW: 13.3 % (ref 11.5–15.5)
WBC: 10.3 10*3/uL (ref 4.0–10.5)
nRBC: 0 % (ref 0.0–0.2)

## 2020-09-24 MED ORDER — SACUBITRIL-VALSARTAN 24-26 MG PO TABS: 1.0000 | ORAL_TABLET | Freq: Two times a day (BID) | ORAL | 6 refills | Status: DC

## 2020-09-24 MED ORDER — NITROGLYCERIN 0.4 MG SL SUBL: 0.4000 mg | SUBLINGUAL_TABLET | SUBLINGUAL | 4 refills | Status: DC | PRN

## 2020-09-24 MED ORDER — CARVEDILOL 12.5 MG PO TABS: 12.5000 mg | ORAL_TABLET | Freq: Two times a day (BID) | ORAL | 6 refills | Status: DC

## 2020-09-24 MED ORDER — METFORMIN HCL 500 MG PO TABS: 500.0000 mg | ORAL_TABLET | Freq: Two times a day (BID) | ORAL | Status: DC

## 2020-09-24 MED ORDER — SPIRONOLACTONE 25 MG PO TABS: 25.0000 mg | ORAL_TABLET | Freq: Every day | ORAL | 6 refills | Status: DC

## 2020-09-24 MED ORDER — ROSUVASTATIN CALCIUM 5 MG PO TABS
10.0000 mg | ORAL_TABLET | Freq: Every day | ORAL | Status: DC
  Administered 2020-09-24: 10 mg via ORAL
  Filled 2020-09-24: qty 2

## 2020-09-24 MED ORDER — METFORMIN HCL 500 MG PO TABS: 500.0000 mg | ORAL_TABLET | Freq: Two times a day (BID) | ORAL | 6 refills | Status: DC

## 2020-09-24 MED ORDER — TICAGRELOR 90 MG PO TABS: 90.0000 mg | ORAL_TABLET | Freq: Two times a day (BID) | ORAL | 11 refills | Status: DC

## 2020-09-24 MED ORDER — ACETAMINOPHEN 325 MG PO TABS: 650.0000 mg | ORAL_TABLET | ORAL | Status: AC | PRN

## 2020-09-24 MED ORDER — ROSUVASTATIN CALCIUM 10 MG PO TABS: 10.0000 mg | ORAL_TABLET | Freq: Every day | ORAL | 6 refills | Status: DC

## 2020-09-24 MED ORDER — ASPIRIN 81 MG PO TBEC: 81.0000 mg | DELAYED_RELEASE_TABLET | Freq: Every day | ORAL | 11 refills | Status: DC

## 2020-09-24 NOTE — Discharge Summary (Addendum)
Discharge Summary    Patient ID: Tracey Meyer MRN: [id number]; DOB: [birth year]  Admit date: 09/22/2020 Discharge date: 09/24/2020  PCP:  Pcp, No   [facility] Providers Cardiologist:  None        Discharge Diagnoses    Principal Problem:   Acute ST elevation myocardial infarction (STEMI) due to occlusion of left anterior descending (LAD) coronary artery (HCC) Active Problems:   ST elevation myocardial infarction involving left anterior descending (LAD) coronary artery (HCC)   HTN (hypertension)   Hyperlipidemia   STEMI involving left anterior descending coronary artery (HCC)   Tobacco abuse   Cardiomyopathy, ischemic   S/P angioplasty with stent to mLAD 09/22/20    Transaminitis   DM (diabetes mellitus), type 2 ([place])    Diagnostic Studies/Procedures    Echo 09/22/20:  1. Apex well-visualized without contrast - false tendon (normal variant)  - no thrombus. Left ventricular ejection fraction, by estimation, is 35 to  40%. The left ventricle has moderately decreased function. The left  ventricle demonstrates regional wall  motion abnormalities (see scoring diagram/findings for description). There  is mild left ventricular hypertrophy. Left ventricular diastolic  parameters are consistent with Grade I diastolic dysfunction (impaired  relaxation). Elevated left ventricular  end-diastolic pressure. There is severe hypokinesis of the left  ventricular, entire anterior wall, anteroseptal wall, apical segment and  inferoapical segment. Findings suggest LAD territory ischemia/infarct.   2. Right ventricular systolic function is normal. The right ventricular  size is normal. There is normal pulmonary artery systolic pressure.   3. The mitral valve is abnormal. Mild mitral valve regurgitation.   4. The aortic valve is tricuspid. Aortic valve regurgitation is not  visualized.   5. The inferior vena cava is normal in size with <50% respiratory  variability, suggesting right  atrial pressure of 8 mmHg.   LHC 09/22/20: RPDA lesion is 50% stenosed. Dist Cx lesion is 30% stenosed with 30% stenosed side branch in LPAV. 1st Diag lesion is 60% stenosed. Mid LAD lesion is 100% stenosed. Post intervention, there is a 0% residual stenosis. A drug-eluting stent was successfully placed using a STENT ONYX FRONTIER 2.5X38. LV end diastolic pressure is mildly elevated.   1. 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 Mildly elevated LVEDP 21 mmHg 3. Successful PCI of the mid LAD with DES x 1   Diagnostic Dominance: Co-dominant      Intervention        _____________   History of Present Illness     Tracey Meyer is a 60 y.o. female with hx of HTN, HLD not treated was transferred to [facility] 09/22/20 with STEMI of ant wall.   She also reported borderline DM-2.  She did admit to not having seen MD in years.  Has not been on any medication.  She developed upper abd pain 09/21/20 around 7 pm associated with nausea, vomiting and diarrhea and diaphoresis.  She went to ER in [place] and EKG with ant MI ST elevation ant. Wall.  BP was 215/110 and she was given ASA IV heparin and IV lopressor she was transferred to [facility] for emergent cardiac cath.  She had marked ST elevation in the precordial leads with Q waves.  She was having continued chest pain.   BB was started on NTG.  She does use tobacco.      Hospital Course     Consultants: none   Pt went emergently to cath lab though she was 14 hours from onset  of pain.   She had 100% mLAD stenosis and underwent DES Onyx Frontier stent.  She had mild non obstructive disease in other vessels, see above.  Hs troponin pk >24,000.  She is on ASA and Brilinta , coreg along with entresto.    Pt did well post procedure.  She was given information about phase 2 cardiac rehab from rehab.      EF is low at 35-40%  mild LVH.  Has ben started on entresto and aldactone.   She is instructed to weigh daily and call if wt up.  Also  fluid restriction and salt restriction.  She was euvolemic on discahrge.   HTN is improved.    Her A1C is elevated at 6.9% she was placed on Metformin at discharge consider SGLT2 inhibitor as outpt though pt is self pay.  Her LFTs were elevated and statin was held but improving at discharge and could be related to MI, so low dose statin added.    She plans to stop tobacco on her own, she declined Nicoderm patches.    She was seen and evaluated by Dr. Oval Linsey and found stable for discharge.  Pt is self pay so medication may be an issue.  She was discharged with 30 day fee cards for entresto and Brilinta.  She may need to change to plavix if she cannot afford.    Other issue is transportation, [employer], [employer] is providing ride home today but no stops and she will need local appts due to transportation issues.    2V CXR  IMPRESSION: 1. Improved aeration at the right lung base with mild residual atelectasis. No new airspace disease. 2. Trace fluid in the fissures.  Borderline cardiomegaly.   Did the patient have an acute coronary syndrome (MI, NSTEMI, STEMI, etc) this admission?:  Yes                               AHA/ACC Clinical Performance & Quality Measures: Aspirin prescribed? - Yes ADP Receptor Inhibitor (Plavix/Clopidogrel, Brilinta/Ticagrelor or Effient/Prasugrel) prescribed (includes medically managed patients)? - Yes Beta Blocker prescribed? - YES High Intensity Statin (Lipitor 40-80mg  or Crestor 20-40mg ) prescribed? - No - elevated LFTs EF assessed during THIS hospitalization? - Yes For EF <40%, was ACEI/ARB prescribed? - Yes For EF <40%, Aldosterone Antagonist (Spironolactone or Eplerenone) prescribed? - Yes Cardiac Rehab Phase II ordered (including medically managed patients)? - Yes      _____________  Discharge Vitals Blood pressure 125/80, pulse 73, temperature 98.2 F (36.8 C), temperature source Oral, resp. rate (!) 25, SpO2 96 %.  There were no vitals filed for  this visit.  Labs & Radiologic Studies    CBC Recent Labs    09/23/20 0134 09/24/20 0317  WBC 12.9* 10.3  HGB 12.7 13.1  HCT 38.3 38.5  MCV 93.2 91.7  PLT 249 810   Basic Metabolic Panel Recent Labs    09/23/20 0134 09/24/20 0317  NA 132* 133*  K 3.9 3.4*  CL 100 104  CO2 22 21*  GLUCOSE 194* 142*  BUN 28* 27*  CREATININE 1.14* 1.09*  CALCIUM 9.7 9.3   Liver Function Tests Recent Labs    09/24/20 0317  AST 163*  ALT 55*  ALKPHOS 48  BILITOT 1.1  PROT 6.8  ALBUMIN 2.9*   No results for input(s): LIPASE, AMYLASE in the last 72 hours. High Sensitivity Troponin:   Recent Labs  Lab 09/22/20 1233  TROPONINIHS >  24,000*    BNP Invalid input(s): POCBNP D-Dimer No results for input(s): DDIMER in the last 72 hours. Hemoglobin A1C Recent Labs    09/22/20 1233  HGBA1C 6.9*   Fasting Lipid Panel Recent Labs    09/23/20 0134  CHOL 234*  HDL 79  LDLCALC 138*  TRIG 87  CHOLHDL 3.0   Thyroid Function Tests No results for input(s): TSH, T4TOTAL, T3FREE, THYROIDAB in the last 72 hours.  Invalid input(s): FREET3 _____________  DG Chest 2 View  Result Date: 09/23/2020 CLINICAL DATA:  Question right lung pneumonia on outside radiograph. EXAM: CHEST - 2 VIEW COMPARISON:  Radiograph yesterday at [facility]. FINDINGS: Lung volumes are low. Borderline cardiomegaly is similar. There is improved right basilar aeration with mild residual atelectasis. No new airspace disease. There is minimal fluid in the fissures without large subpulmonic effusion. No pneumothorax. Stable osseous structures. IMPRESSION: 1. Improved aeration at the right lung base with mild residual atelectasis. No new airspace disease. 2. Trace fluid in the fissures.  Borderline cardiomegaly. Electronically Signed   By: Keith Rake M.D.   On: 09/23/2020 17:51   CARDIAC CATHETERIZATION  Result Date: 09/22/2020  RPDA lesion is 50% stenosed.  Dist Cx lesion is 30% stenosed with 30% stenosed  side branch in LPAV.  1st Diag lesion is 60% stenosed.  Mid LAD lesion is 100% stenosed.  Post intervention, there is a 0% residual stenosis.  A drug-eluting stent was successfully placed using a STENT ONYX FRONTIER 2.5X38.  LV end diastolic pressure is mildly elevated.  1. 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 Mildly elevated LVEDP 21 mmHg 3. Successful PCI of the mid LAD with DES x 1 Plan: DAPT for one year. I suspect she will have significant myocardial injury given late presentation. Will check Echo. Aggressive risk factor modification.   ECHOCARDIOGRAM COMPLETE  Result Date: 09/22/2020    ECHOCARDIOGRAM REPORT   Patient Name:   Tracey Meyer Date of Exam: 09/22/2020 Medical Rec #:  [id number]          Height: Accession #:    [id number]         Weight: Date of Birth:  May 16, 1960          BSA: Patient Age:    22 years           BP:           180/99 mmHg Patient Gender: F                  HR:           83 bpm. Exam Location:  Inpatient Procedure: 2D Echo, Cardiac Doppler and Color Doppler Indications:    acute MI  History:        Patient has no prior history of Echocardiogram examinations.                 Acute MI, Signs/Symptoms:Chest Pain; Risk Factors:Hypertension,                 Dyslipidemia, Diabetes and Current Smoker.  Sonographer:    Dustin Flock Referring Phys: [id number] PETER M [place] IMPRESSIONS  1. Apex well-visualized without contrast - false tendon (normal variant) - no thrombus. Left ventricular ejection fraction, by estimation, is 35 to 40%. The left ventricle has moderately decreased function. The left ventricle demonstrates regional wall motion abnormalities (see scoring diagram/findings for description). There is mild left ventricular hypertrophy. Left ventricular diastolic parameters are consistent with Grade I  diastolic dysfunction (impaired relaxation). Elevated left ventricular end-diastolic pressure. There is severe hypokinesis of the left ventricular, entire  anterior wall, anteroseptal wall, apical segment and inferoapical segment. Findings suggest LAD territory ischemia/infarct.  2. Right ventricular systolic function is normal. The right ventricular size is normal. There is normal pulmonary artery systolic pressure.  3. The mitral valve is abnormal. Mild mitral valve regurgitation.  4. The aortic valve is tricuspid. Aortic valve regurgitation is not visualized.  5. The inferior vena cava is normal in size with <50% respiratory variability, suggesting right atrial pressure of 8 mmHg. Comparison(s): No prior Echocardiogram. FINDINGS  Left Ventricle: Apex well-visualized without contrast - false tendon (normal variant) - no thrombus. Left ventricular ejection fraction, by estimation, is 35 to 40%. The left ventricle has moderately decreased function. The left ventricle demonstrates regional wall motion abnormalities. Severe hypokinesis of the left ventricular, entire anterior wall, anteroseptal wall, apical segment and inferoapical segment. The left ventricular internal cavity size was normal in size. There is mild left ventricular  hypertrophy. Left ventricular diastolic parameters are consistent with Grade I diastolic dysfunction (impaired relaxation). Elevated left ventricular end-diastolic pressure.  LV Wall Scoring: The mid and distal anterior wall, mid and distal anterior septum, and entire apex are hypokinetic. The antero-lateral wall, inferior wall, posterior wall, basal anteroseptal segment, mid inferoseptal segment, basal anterior segment, and basal inferoseptal segment are normal. Right Ventricle: The right ventricular size is normal. No increase in right ventricular wall thickness. Right ventricular systolic function is normal. There is normal pulmonary artery systolic pressure. The tricuspid regurgitant velocity is 2.58 m/s, and  with an assumed right atrial pressure of 8 mmHg, the estimated right ventricular systolic pressure is 78.2 mmHg. Left Atrium: Left  atrial size was normal in size. Right Atrium: Right atrial size was normal in size. Pericardium: There is no evidence of pericardial effusion. Mitral Valve: The mitral valve is abnormal. There is mild thickening of the mitral valve leaflet(s). Mild mitral valve regurgitation. Tricuspid Valve: The tricuspid valve is grossly normal. Tricuspid valve regurgitation is trivial. Aortic Valve: The aortic valve is tricuspid. Aortic valve regurgitation is not visualized. Pulmonic Valve: The pulmonic valve was grossly normal. Pulmonic valve regurgitation is not visualized. Aorta: The aortic root and ascending aorta are structurally normal, with no evidence of dilitation. Venous: The inferior vena cava is normal in size with less than 50% respiratory variability, suggesting right atrial pressure of 8 mmHg. IAS/Shunts: No atrial level shunt detected by color flow Doppler.  LEFT VENTRICLE PLAX 2D LVIDd:         4.90 cm      Diastology LVIDs:         4.40 cm      LV e' medial:    4.13 cm/s LV PW:         1.20 cm      LV E/e' medial:  16.2 LV IVS:        1.00 cm      LV e' lateral:   3.48 cm/s LVOT diam:     2.10 cm      LV E/e' lateral: 19.3 LV SV:         49 LVOT Area:     3.46 cm  LV Volumes (MOD) LV vol d, MOD A4C: 131.0 ml LV vol s, MOD A4C: 75.5 ml LV SV MOD A4C:     131.0 ml RIGHT VENTRICLE RV Basal diam:  2.20 cm RV S prime:     9.57 cm/s TAPSE (M-mode):  1.8 cm LEFT ATRIUM             RIGHT ATRIUM LA diam:        3.80 cm RA Area:     10.10 cm LA Vol (A2C):   28.4 ml RA Volume:   19.60 ml LA Vol (A4C):   34.6 ml LA Biplane Vol: 31.7 ml  AORTIC VALVE LVOT Vmax:   83.50 cm/s LVOT Vmean:  54.400 cm/s LVOT VTI:    0.141 m  AORTA Ao Root diam: 2.70 cm MITRAL VALVE                TRICUSPID VALVE MV Area (PHT): 6.65 cm     TR Peak grad:   26.6 mmHg MV Decel Time: 114 msec     TR Vmax:        258.00 cm/s MV E velocity: 67.00 cm/s MV A velocity: 100.00 cm/s  SHUNTS MV E/A ratio:  0.67         Systemic VTI:  0.14 m                              Systemic Diam: 2.10 cm Lyman Bishop MD Electronically signed by Lyman Bishop MD Signature Date/Time: 09/22/2020/1:04:05 PM    Final    Disposition   Pt is being discharged home today in good condition.  Follow-up Plans & Appointments   Call [facility] at [phone] if any bleeding, swelling or drainage at cath site.  May shower, no tub baths for 48 hours for groin sticks. No lifting over 5 pounds for 5 days.  No Driving for 5 days no work if you work until seen in the office  Take 1 NTG, under your tongue, while sitting.  If no relief of pain may repeat NTG, one tab every 5 minutes up to 3 tablets total over 15 minutes.  If no relief CALL 911.  If you have dizziness/lightheadness  while taking NTG, stop taking and call 911.        Low fat low salt diabetic diet.  No more than 2000 mg of salt or sodium in a day and no more than 1500 cc or ml of liquid per day about 2 quarts.    Weigh daily if you have scales  Call the office if weight goes up 3 pounds in a day or 5 pounds in a week for further instructions.   Call cardiology if you have trouble obtaining medications.   Do not stop asprin or brilinta they keep the stent open and prevent heart attack.  Stop smoking.  Follow-up Information     Revankar, Reita Cliche, MD. Call.   Specialty: Cardiology Why: call tomorrow to make appt.  I will send message to them as well.  you should be seen in 7-14 days. Contact information: [place] [address] [phone]                Discharge Instructions     Amb Referral to Cardiac Rehabilitation   Complete by: As directed    Referring to [place] CRP 2   Diagnosis:  STEMI Coronary Stents     After initial evaluation and assessments completed: Virtual Based Care may be provided alone or in conjunction with Phase 2 Cardiac Rehab based on patient barriers.: Yes       Discharge Medications   Allergies as of 09/24/2020   Not on File  Medication List     STOP taking these medications    OVER THE COUNTER MEDICATION       TAKE these medications    acetaminophen 325 MG tablet Commonly known as: TYLENOL Take 2 tablets (650 mg total) by mouth every 4 (four) hours as needed for headache or mild pain.   aspirin 81 MG EC tablet Take 1 tablet (81 mg total) by mouth daily. Swallow whole. Start taking on: September 25, 2020   carvedilol 12.5 MG tablet Commonly known as: COREG Take 1 tablet (12.5 mg total) by mouth 2 (two) times daily with a meal.   metFORMIN 500 MG tablet Commonly known as: GLUCOPHAGE Take 1 tablet (500 mg total) by mouth 2 (two) times daily with a meal.   nitroGLYCERIN 0.4 MG SL tablet Commonly known as: NITROSTAT Place 1 tablet (0.4 mg total) under the tongue every 5 (five) minutes x 3 doses as needed for chest pain.   rosuvastatin 10 MG tablet Commonly known as: CRESTOR Take 1 tablet (10 mg total) by mouth daily. Start taking on: September 25, 2020   sacubitril-valsartan 24-26 MG Commonly known as: ENTRESTO Take 1 tablet by mouth 2 (two) times daily.   spironolactone 25 MG tablet Commonly known as: ALDACTONE Take 1 tablet (25 mg total) by mouth daily. Start taking on: September 25, 2020   ticagrelor 90 MG Tabs tablet Commonly known as: BRILINTA Take 1 tablet (90 mg total) by mouth 2 (two) times daily.           Outstanding Labs/Studies   BMP on visit  Hepatic and lipids in 6-8 weeks.   Duration of Discharge Encounter   Greater than 30 minutes including physician time.  Signed, Cecilie Kicks, NP 09/24/2020, 10:35 AM

## 2020-09-24 NOTE — Progress Notes (Signed)
Progress Note  Patient Name: Tracey Meyer Date of Encounter: 09/24/2020  [facility] Cardiologist: None   Subjective   Feeling well.  Eager to go home.  Inpatient Medications    Scheduled Meds:  aspirin EC  81 mg Oral Daily   carvedilol  12.5 mg Oral BID WC   Chlorhexidine Gluconate Cloth  6 each Topical Q0600   heparin  5,000 Units Subcutaneous Q8H   insulin aspart  0-9 Units Subcutaneous TID WC   sacubitril-valsartan  1 tablet Oral BID   sodium chloride flush  3 mL Intravenous Q12H   spironolactone  25 mg Oral Daily   ticagrelor  90 mg Oral BID   Continuous Infusions:  sodium chloride     nitroGLYCERIN Stopped (09/23/20 0800)   PRN Meds: sodium chloride, acetaminophen, alum & mag hydroxide-simeth, nitroGLYCERIN, ondansetron (ZOFRAN) IV, sodium chloride flush   Vital Signs    Vitals:   09/24/20 0650 09/24/20 0700 09/24/20 0736 09/24/20 0800  BP:  116/86  127/88  Pulse:  73  79  Resp:  (!) 22  (!) 24  Temp: 98.1 F (36.7 C)  98.2 F (36.8 C)   TempSrc: Oral  Oral   SpO2:  100%  99%    Intake/Output Summary (Last 24 hours) at 09/24/2020 0841 Last data filed at 09/24/2020 0800 Gross per 24 hour  Intake 1185.07 ml  Output 150 ml  Net 1035.07 ml   No flowsheet data found.    Telemetry    Sinus rhythm.  No events - Personally Reviewed  ECG    N/a - Personally Reviewed  Physical Exam   VS:  BP 127/88 (BP Location: Right Arm)   Pulse 79   Temp 98.2 F (36.8 C) (Oral)   Resp (!) 24   SpO2 99%  , BMI There is no height or weight on file to calculate BMI. GENERAL:  Well appearing HEENT: Pupils equal round and reactive, fundi not visualized, oral mucosa unremarkable NECK:  No jugular venous distention, waveform within normal limits, carotid upstroke brisk and symmetric, no bruits LUNGS:  Clear to auscultation bilaterally HEART:  RRR.  PMI not displaced or sustained,S1 and S2 within normal limits, no S3, no S4, no clicks, no rubs, no murmurs ABD:   Flat, positive bowel sounds normal in frequency in pitch, no bruits, no rebound, no guarding, no midline pulsatile mass, no hepatomegaly, no splenomegaly EXT:  2 plus pulses throughout, no edema, no cyanosis no clubbing SKIN:  No rashes no nodules NEURO:  Cranial nerves II through XII grossly intact, motor grossly intact throughout [facility]:  Cognitively intact, oriented to person place and time   Labs    High Sensitivity Troponin:   Recent Labs  Lab 09/22/20 1233  TROPONINIHS >24,000*      Chemistry Recent Labs  Lab 09/22/20 0940 09/22/20 1233 09/23/20 0134 09/24/20 0317  NA 137  --  132* 133*  K 3.8  --  3.9 3.4*  CL 103  --  100 104  CO2  --   --  22 21*  GLUCOSE 264*  --  194* 142*  BUN 18  --  28* 27*  CREATININE 0.80 1.10* 1.14* 1.09*  CALCIUM  --   --  9.7 9.3  PROT  --   --   --  6.8  ALBUMIN  --   --   --  2.9*  AST  --   --   --  163*  ALT  --   --   --  63*  ALKPHOS  --   --   --  48  BILITOT  --   --   --  1.1  GFRNONAA  --  58* 55* 59*  ANIONGAP  --   --  10 8     Hematology Recent Labs  Lab 09/22/20 1233 09/23/20 0134 09/24/20 0317  WBC 11.7* 12.9* 10.3  RBC 4.40 4.11 4.20  HGB 13.9 12.7 13.1  HCT 41.0 38.3 38.5  MCV 93.2 93.2 91.7  MCH 31.6 30.9 31.2  MCHC 33.9 33.2 34.0  RDW 13.8 13.8 13.3  PLT 337 249 287    BNPNo results for input(s): BNP, PROBNP in the last 168 hours.   DDimer No results for input(s): DDIMER in the last 168 hours.   Radiology    DG Chest 2 View  Result Date: 09/23/2020 CLINICAL DATA:  Question right lung pneumonia on outside radiograph. EXAM: CHEST - 2 VIEW COMPARISON:  Radiograph yesterday at [facility]. FINDINGS: Lung volumes are low. Borderline cardiomegaly is similar. There is improved right basilar aeration with mild residual atelectasis. No new airspace disease. There is minimal fluid in the fissures without large subpulmonic effusion. No pneumothorax. Stable osseous structures. IMPRESSION: 1. Improved  aeration at the right lung base with mild residual atelectasis. No new airspace disease. 2. Trace fluid in the fissures.  Borderline cardiomegaly. Electronically Signed   By: Keith Rake M.D.   On: 09/23/2020 17:51   CARDIAC CATHETERIZATION  Result Date: 09/22/2020  RPDA lesion is 50% stenosed.  Dist Cx lesion is 30% stenosed with 30% stenosed side branch in LPAV.  1st Diag lesion is 60% stenosed.  Mid LAD lesion is 100% stenosed.  Post intervention, there is a 0% residual stenosis.  A drug-eluting stent was successfully placed using a STENT ONYX FRONTIER 2.5X38.  LV end diastolic pressure is mildly elevated.  1. 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 Mildly elevated LVEDP 21 mmHg 3. Successful PCI of the mid LAD with DES x 1 Plan: DAPT for one year. I suspect she will have significant myocardial injury given late presentation. Will check Echo. Aggressive risk factor modification.   ECHOCARDIOGRAM COMPLETE  Result Date: 09/22/2020    ECHOCARDIOGRAM REPORT   Patient Name:   Tracey Meyer Date of Exam: 09/22/2020 Medical Rec #:  [id number]          Height: Accession #:    [id number]         Weight: Date of Birth:  1961/01/14          BSA: Patient Age:    60 years           BP:           180/99 mmHg Patient Gender: F                  HR:           83 bpm. Exam Location:  Inpatient Procedure: 2D Echo, Cardiac Doppler and Color Doppler Indications:    acute MI  History:        Patient has no prior history of Echocardiogram examinations.                 Acute MI, Signs/Symptoms:Chest Pain; Risk Factors:Hypertension,                 Dyslipidemia, Diabetes and Current Smoker.  Sonographer:    Dustin Flock Referring Phys: [id number] PETER M [place] IMPRESSIONS  1. Apex well-visualized without  contrast - false tendon (normal variant) - no thrombus. Left ventricular ejection fraction, by estimation, is 35 to 40%. The left ventricle has moderately decreased function. The left ventricle  demonstrates regional wall motion abnormalities (see scoring diagram/findings for description). There is mild left ventricular hypertrophy. Left ventricular diastolic parameters are consistent with Grade I diastolic dysfunction (impaired relaxation). Elevated left ventricular end-diastolic pressure. There is severe hypokinesis of the left ventricular, entire anterior wall, anteroseptal wall, apical segment and inferoapical segment. Findings suggest LAD territory ischemia/infarct.  2. Right ventricular systolic function is normal. The right ventricular size is normal. There is normal pulmonary artery systolic pressure.  3. The mitral valve is abnormal. Mild mitral valve regurgitation.  4. The aortic valve is tricuspid. Aortic valve regurgitation is not visualized.  5. The inferior vena cava is normal in size with <50% respiratory variability, suggesting right atrial pressure of 8 mmHg. Comparison(s): No prior Echocardiogram. FINDINGS  Left Ventricle: Apex well-visualized without contrast - false tendon (normal variant) - no thrombus. Left ventricular ejection fraction, by estimation, is 35 to 40%. The left ventricle has moderately decreased function. The left ventricle demonstrates regional wall motion abnormalities. Severe hypokinesis of the left ventricular, entire anterior wall, anteroseptal wall, apical segment and inferoapical segment. The left ventricular internal cavity size was normal in size. There is mild left ventricular  hypertrophy. Left ventricular diastolic parameters are consistent with Grade I diastolic dysfunction (impaired relaxation). Elevated left ventricular end-diastolic pressure.  LV Wall Scoring: The mid and distal anterior wall, mid and distal anterior septum, and entire apex are hypokinetic. The antero-lateral wall, inferior wall, posterior wall, basal anteroseptal segment, mid inferoseptal segment, basal anterior segment, and basal inferoseptal segment are normal. Right Ventricle: The  right ventricular size is normal. No increase in right ventricular wall thickness. Right ventricular systolic function is normal. There is normal pulmonary artery systolic pressure. The tricuspid regurgitant velocity is 2.58 m/s, and  with an assumed right atrial pressure of 8 mmHg, the estimated right ventricular systolic pressure is 13.0 mmHg. Left Atrium: Left atrial size was normal in size. Right Atrium: Right atrial size was normal in size. Pericardium: There is no evidence of pericardial effusion. Mitral Valve: The mitral valve is abnormal. There is mild thickening of the mitral valve leaflet(s). Mild mitral valve regurgitation. Tricuspid Valve: The tricuspid valve is grossly normal. Tricuspid valve regurgitation is trivial. Aortic Valve: The aortic valve is tricuspid. Aortic valve regurgitation is not visualized. Pulmonic Valve: The pulmonic valve was grossly normal. Pulmonic valve regurgitation is not visualized. Aorta: The aortic root and ascending aorta are structurally normal, with no evidence of dilitation. Venous: The inferior vena cava is normal in size with less than 50% respiratory variability, suggesting right atrial pressure of 8 mmHg. IAS/Shunts: No atrial level shunt detected by color flow Doppler.  LEFT VENTRICLE PLAX 2D LVIDd:         4.90 cm      Diastology LVIDs:         4.40 cm      LV e' medial:    4.13 cm/s LV PW:         1.20 cm      LV E/e' medial:  16.2 LV IVS:        1.00 cm      LV e' lateral:   3.48 cm/s LVOT diam:     2.10 cm      LV E/e' lateral: 19.3 LV SV:         49 LVOT  Area:     3.46 cm  LV Volumes (MOD) LV vol d, MOD A4C: 131.0 ml LV vol s, MOD A4C: 75.5 ml LV SV MOD A4C:     131.0 ml RIGHT VENTRICLE RV Basal diam:  2.20 cm RV S prime:     9.57 cm/s TAPSE (M-mode): 1.8 cm LEFT ATRIUM             RIGHT ATRIUM LA diam:        3.80 cm RA Area:     10.10 cm LA Vol (A2C):   28.4 ml RA Volume:   19.60 ml LA Vol (A4C):   34.6 ml LA Biplane Vol: 31.7 ml  AORTIC VALVE LVOT Vmax:    83.50 cm/s LVOT Vmean:  54.400 cm/s LVOT VTI:    0.141 m  AORTA Ao Root diam: 2.70 cm MITRAL VALVE                TRICUSPID VALVE MV Area (PHT): 6.65 cm     TR Peak grad:   26.6 mmHg MV Decel Time: 114 msec     TR Vmax:        258.00 cm/s MV E velocity: 67.00 cm/s MV A velocity: 100.00 cm/s  SHUNTS MV E/A ratio:  0.67         Systemic VTI:  0.14 m                             Systemic Diam: 2.10 cm Lyman Bishop MD Electronically signed by Lyman Bishop MD Signature Date/Time: 09/22/2020/1:04:05 PM    Final     Cardiac Studies   Echo 09/22/20:  1. Apex well-visualized without contrast - false tendon (normal variant)  - no thrombus. Left ventricular ejection fraction, by estimation, is 35 to  40%. The left ventricle has moderately decreased function. The left  ventricle demonstrates regional wall  motion abnormalities (see scoring diagram/findings for description). There  is mild left ventricular hypertrophy. Left ventricular diastolic  parameters are consistent with Grade I diastolic dysfunction (impaired  relaxation). Elevated left ventricular  end-diastolic pressure. There is severe hypokinesis of the left  ventricular, entire anterior wall, anteroseptal wall, apical segment and  inferoapical segment. Findings suggest LAD territory ischemia/infarct.   2. Right ventricular systolic function is normal. The right ventricular  size is normal. There is normal pulmonary artery systolic pressure.   3. The mitral valve is abnormal. Mild mitral valve regurgitation.   4. The aortic valve is tricuspid. Aortic valve regurgitation is not  visualized.   5. The inferior vena cava is normal in size with <50% respiratory  variability, suggesting right atrial pressure of 8 mmHg.   LHC 09/22/20: RPDA lesion is 50% stenosed. Dist Cx lesion is 30% stenosed with 30% stenosed side branch in LPAV. 1st Diag lesion is 60% stenosed. Mid LAD lesion is 100% stenosed. Post intervention, there is a 0% residual  stenosis. A drug-eluting stent was successfully placed using a STENT ONYX FRONTIER 2.5X38. LV end diastolic pressure is mildly elevated.   1. 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 Mildly elevated LVEDP 21 mmHg 3. Successful PCI of the mid LAD with DES x 1  Diagnostic Dominance: Co-dominant      Intervention           Patient Profile     60 y.o. female with hypertension, hyperlipidemia, tobacco abuse, and borderline DM admitted with STEMI.  Assessment & Plan    #  STEMI: # Hyperlipidemia:  Late presentation STEMI  with hs-troponin >24,000.  100% mid LAD was successfully stented with ONYX Frontier 2.5 x38 stent.  She has residual 60% disease in D1, 50% RPDA, and 30% in LCX and LPAV.      LDL 138.  Continue aspirin and carvedilol.  Continue ticagrelor for now.  Patient is self-pay and will need to switch to clopidogrel in the future if she doesn't get financial assistance.  She was given a 30 day free trial.  Add rosuvastatin 10mg  2/2 elevated LFTs.  # Acute systolic and diastolic heart failure:  # Hypertension:  Echo showed LVEF 35-40%.  She is euvolemic on exam.  BP well-controlled on carvedilol, Entresto, and spironolactone.  Consider SGLT2 inhibitor as an outpatient.  Patient is self-pay.  She will need 30 day free card for [facility] and ticagrelor.  Will need f/u within 30 days.  We discussed 1.5L fluid and 1500 mg sodium restrictions.  Daily weight.  Call office if increase of 2lb in one day or 5 lb in a week.   # Pre-diabetes:  A1c 6.9%.  Start Metformin 500mg  bid.  Consider SGLT2 inhibitor as an outpatient.  Patient is self-pay.  # Transaminitis:  Statin was initially held.  AST 163, ALT 55 today down from 638/84.  Will start low intensity statin at discharge and repeat LFTs at follow up.  Repeat lipids/CMP in 2-3 months.   # Tobacco abuse:  She declines patches.  Ready to quit on her own.    For questions or updates, please contact [place] Please consult [email or web address] for contact info under        Signed, Skeet Latch, MD  09/24/2020, 8:41 AM

## 2020-09-24 NOTE — Discharge Instructions (Addendum)
Call [facility] at [phone] if any bleeding, swelling or drainage at cath site.  May shower, no tub baths for 48 hours for groin sticks. No lifting over 5 pounds for 5 days.  No Driving for 5 days no work if you work until seen in the office  Take 1 NTG, under your tongue, while sitting.  If no relief of pain may repeat NTG, one tab every 5 minutes up to 3 tablets total over 15 minutes.  If no relief CALL 911.  If you have dizziness/lightheadness  while taking NTG, stop taking and call 911.        Low fat low salt diabetic diet.  No more than 2000 mg of salt or sodium in a day and no more than 1500 cc or ml of liquid per day about 2 quarts.    Weigh daily if you have scales  Call the office if weight goes up 3 pounds in a day or 5 pounds in a week for further instructions.   Call cardiology if you have trouble obtaining medications.   Do not stop asprin or brilinta they keep the stent open and prevent heart attack.  Stop smoking.

## 2020-09-24 NOTE — Progress Notes (Signed)
CSW received request with assistance for transportation for patient. CSW spoke with patient at bedside. Patient confirmed that her sister-n-law will be pick her up at dc. Patient requested resources for Medicaid and Billing. CSW provided patient with resources for Medicaid and Billing. Patient accepted.

## 2020-09-25 ENCOUNTER — Encounter (HOSPITAL_COMMUNITY): Payer: Self-pay | Admitting: Cardiology

## 2020-09-25 ENCOUNTER — Telehealth (HOSPITAL_COMMUNITY): Payer: Self-pay | Admitting: *Deleted

## 2020-09-26 LAB — GLUCOSE, CAPILLARY
Glucose-Capillary: 177 mg/dL — ABNORMAL HIGH (ref 70–99)
Glucose-Capillary: 138 mg/dL — ABNORMAL HIGH (ref 70–99)

## 2020-09-27 ENCOUNTER — Emergency Department (HOSPITAL_COMMUNITY): Admit: 2020-09-27 | Payer: MEDICAID | Admitting: Cardiology

## 2020-09-27 ENCOUNTER — Telehealth: Payer: Self-pay | Admitting: Cardiology

## 2020-09-27 ENCOUNTER — Emergency Department (HOSPITAL_COMMUNITY): Payer: Medicaid Other

## 2020-09-27 ENCOUNTER — Inpatient Hospital Stay (HOSPITAL_COMMUNITY)
Admission: EM | Admit: 2020-09-27 | Discharge: 2020-10-09 | DRG: 064 | Disposition: A | Discharge status: Rehabilitation Facility | Payer: Medicaid Other | Attending: Family Medicine | Admitting: Family Medicine

## 2020-09-27 ENCOUNTER — Encounter (HOSPITAL_COMMUNITY)
Admission: EM | Disposition: A | Discharge status: Rehabilitation Facility | Payer: Self-pay | Source: Home / Self Care | Attending: Family Medicine

## 2020-09-27 ENCOUNTER — Other Ambulatory Visit: Payer: Self-pay

## 2020-09-27 DIAGNOSIS — I2510 Atherosclerotic heart disease of native coronary artery without angina pectoris: Secondary | ICD-10-CM | POA: Diagnosis present

## 2020-09-27 DIAGNOSIS — T45525A Adverse effect of antithrombotic drugs, initial encounter: Secondary | ICD-10-CM | POA: Diagnosis not present

## 2020-09-27 DIAGNOSIS — N183 Chronic kidney disease, stage 3 unspecified: Secondary | ICD-10-CM

## 2020-09-27 DIAGNOSIS — Z79899 Other long term (current) drug therapy: Secondary | ICD-10-CM

## 2020-09-27 DIAGNOSIS — D6489 Other specified anemias: Secondary | ICD-10-CM | POA: Diagnosis not present

## 2020-09-27 DIAGNOSIS — I130 Hypertensive heart and chronic kidney disease with heart failure and stage 1 through stage 4 chronic kidney disease, or unspecified chronic kidney disease: Secondary | ICD-10-CM | POA: Diagnosis present

## 2020-09-27 DIAGNOSIS — K5900 Constipation, unspecified: Secondary | ICD-10-CM | POA: Diagnosis not present

## 2020-09-27 DIAGNOSIS — I2102 ST elevation (STEMI) myocardial infarction involving left anterior descending coronary artery: Secondary | ICD-10-CM | POA: Diagnosis present

## 2020-09-27 DIAGNOSIS — I5041 Acute combined systolic (congestive) and diastolic (congestive) heart failure: Secondary | ICD-10-CM

## 2020-09-27 DIAGNOSIS — I6522 Occlusion and stenosis of left carotid artery: Secondary | ICD-10-CM | POA: Diagnosis present

## 2020-09-27 DIAGNOSIS — Z95820 Peripheral vascular angioplasty status with implants and grafts: Secondary | ICD-10-CM

## 2020-09-27 DIAGNOSIS — Z7902 Long term (current) use of antithrombotics/antiplatelets: Secondary | ICD-10-CM | POA: Diagnosis not present

## 2020-09-27 DIAGNOSIS — I5020 Unspecified systolic (congestive) heart failure: Secondary | ICD-10-CM | POA: Diagnosis present

## 2020-09-27 DIAGNOSIS — I25118 Atherosclerotic heart disease of native coronary artery with other forms of angina pectoris: Secondary | ICD-10-CM

## 2020-09-27 DIAGNOSIS — R0602 Shortness of breath: Secondary | ICD-10-CM

## 2020-09-27 DIAGNOSIS — I10 Essential (primary) hypertension: Secondary | ICD-10-CM | POA: Diagnosis present

## 2020-09-27 DIAGNOSIS — Z20822 Contact with and (suspected) exposure to covid-19: Secondary | ICD-10-CM | POA: Diagnosis present

## 2020-09-27 DIAGNOSIS — Z7982 Long term (current) use of aspirin: Secondary | ICD-10-CM

## 2020-09-27 DIAGNOSIS — N1832 Chronic kidney disease, stage 3b: Secondary | ICD-10-CM | POA: Diagnosis present

## 2020-09-27 DIAGNOSIS — R531 Weakness: Secondary | ICD-10-CM

## 2020-09-27 DIAGNOSIS — R270 Ataxia, unspecified: Secondary | ICD-10-CM | POA: Diagnosis present

## 2020-09-27 DIAGNOSIS — E785 Hyperlipidemia, unspecified: Secondary | ICD-10-CM | POA: Diagnosis present

## 2020-09-27 DIAGNOSIS — E119 Type 2 diabetes mellitus without complications: Secondary | ICD-10-CM

## 2020-09-27 DIAGNOSIS — I5042 Chronic combined systolic (congestive) and diastolic (congestive) heart failure: Secondary | ICD-10-CM | POA: Diagnosis present

## 2020-09-27 DIAGNOSIS — Z7984 Long term (current) use of oral hypoglycemic drugs: Secondary | ICD-10-CM

## 2020-09-27 DIAGNOSIS — R339 Retention of urine, unspecified: Secondary | ICD-10-CM | POA: Diagnosis not present

## 2020-09-27 DIAGNOSIS — Z955 Presence of coronary angioplasty implant and graft: Secondary | ICD-10-CM

## 2020-09-27 DIAGNOSIS — I6340 Cerebral infarction due to embolism of unspecified cerebral artery: Principal | ICD-10-CM | POA: Diagnosis present

## 2020-09-27 DIAGNOSIS — I2109 ST elevation (STEMI) myocardial infarction involving other coronary artery of anterior wall: Secondary | ICD-10-CM

## 2020-09-27 DIAGNOSIS — Z853 Personal history of malignant neoplasm of breast: Secondary | ICD-10-CM

## 2020-09-27 DIAGNOSIS — I255 Ischemic cardiomyopathy: Secondary | ICD-10-CM | POA: Diagnosis present

## 2020-09-27 DIAGNOSIS — G8194 Hemiplegia, unspecified affecting left nondominant side: Secondary | ICD-10-CM | POA: Diagnosis present

## 2020-09-27 DIAGNOSIS — F17210 Nicotine dependence, cigarettes, uncomplicated: Secondary | ICD-10-CM | POA: Diagnosis present

## 2020-09-27 DIAGNOSIS — R12 Heartburn: Secondary | ICD-10-CM | POA: Diagnosis not present

## 2020-09-27 DIAGNOSIS — I639 Cerebral infarction, unspecified: Secondary | ICD-10-CM | POA: Diagnosis present

## 2020-09-27 DIAGNOSIS — R471 Dysarthria and anarthria: Secondary | ICD-10-CM | POA: Diagnosis present

## 2020-09-27 DIAGNOSIS — E7800 Pure hypercholesterolemia, unspecified: Secondary | ICD-10-CM

## 2020-09-27 DIAGNOSIS — Z720 Tobacco use: Secondary | ICD-10-CM | POA: Diagnosis present

## 2020-09-27 DIAGNOSIS — E1159 Type 2 diabetes mellitus with other circulatory complications: Secondary | ICD-10-CM

## 2020-09-27 DIAGNOSIS — E1122 Type 2 diabetes mellitus with diabetic chronic kidney disease: Secondary | ICD-10-CM | POA: Diagnosis present

## 2020-09-27 DIAGNOSIS — R29706 NIHSS score 6: Secondary | ICD-10-CM | POA: Diagnosis present

## 2020-09-27 DIAGNOSIS — Z8249 Family history of ischemic heart disease and other diseases of the circulatory system: Secondary | ICD-10-CM

## 2020-09-27 DIAGNOSIS — I69354 Hemiplegia and hemiparesis following cerebral infarction affecting left non-dominant side: Secondary | ICD-10-CM | POA: Diagnosis not present

## 2020-09-27 DIAGNOSIS — Z832 Family history of diseases of the blood and blood-forming organs and certain disorders involving the immune mechanism: Secondary | ICD-10-CM

## 2020-09-27 HISTORY — DX: Unspecified systolic (congestive) heart failure: I50.20

## 2020-09-27 HISTORY — DX: Cerebral infarction, unspecified: I63.9

## 2020-09-27 LAB — COMPREHENSIVE METABOLIC PANEL
ALT: 26 U/L (ref 0–44)
AST: 28 U/L (ref 15–41)
Albumin: 3 g/dL — ABNORMAL LOW (ref 3.5–5.0)
Alkaline Phosphatase: 47 U/L (ref 38–126)
Anion gap: 12 (ref 5–15)
BUN: 26 mg/dL — ABNORMAL HIGH (ref 6–20)
CO2: 18 mmol/L — ABNORMAL LOW (ref 22–32)
Calcium: 9.7 mg/dL (ref 8.9–10.3)
Chloride: 104 mmol/L (ref 98–111)
Creatinine, Ser: 1.3 mg/dL — ABNORMAL HIGH (ref 0.44–1.00)
GFR, Estimated: 47 mL/min — ABNORMAL LOW (ref >60)
Glucose, Bld: 205 mg/dL — ABNORMAL HIGH (ref 70–99)
Potassium: 3.8 mmol/L (ref 3.5–5.1)
Sodium: 134 mmol/L — ABNORMAL LOW (ref 135–145)
Total Bilirubin: 0.5 mg/dL (ref 0.3–1.2)
Total Protein: 6.8 g/dL (ref 6.5–8.1)

## 2020-09-27 LAB — DIFFERENTIAL
Abs Immature Granulocytes: 0.03 10*3/uL (ref 0.00–0.07)
Basophils Absolute: 0 10*3/uL (ref 0.0–0.1)
Basophils Relative: 0 %
Eosinophils Absolute: 0.1 10*3/uL (ref 0.0–0.5)
Eosinophils Relative: 1 %
Immature Granulocytes: 0 %
Lymphocytes Relative: 17 %
Lymphs Abs: 1.1 10*3/uL (ref 0.7–4.0)
Monocytes Absolute: 0.7 10*3/uL (ref 0.1–1.0)
Monocytes Relative: 10 %
Neutro Abs: 4.9 10*3/uL (ref 1.7–7.7)
Neutrophils Relative %: 72 %

## 2020-09-27 LAB — CBC
HCT: 36.4 % (ref 36.0–46.0)
Hemoglobin: 12.1 g/dL (ref 12.0–15.0)
MCH: 31.3 pg (ref 26.0–34.0)
MCHC: 33.2 g/dL (ref 30.0–36.0)
MCV: 94.3 fL (ref 80.0–100.0)
Platelets: 352 10*3/uL (ref 150–400)
RBC: 3.86 MIL/uL — ABNORMAL LOW (ref 3.87–5.11)
RDW: 13.1 % (ref 11.5–15.5)
WBC: 6.9 10*3/uL (ref 4.0–10.5)
nRBC: 0 % (ref 0.0–0.2)

## 2020-09-27 LAB — I-STAT CHEM 8, ED
BUN: 26 mg/dL — ABNORMAL HIGH (ref 6–20)
Calcium, Ion: 1.26 mmol/L (ref 1.15–1.40)
Chloride: 108 mmol/L (ref 98–111)
Creatinine, Ser: 1.2 mg/dL — ABNORMAL HIGH (ref 0.44–1.00)
Glucose, Bld: 201 mg/dL — ABNORMAL HIGH (ref 70–99)
HCT: 36 % (ref 36.0–46.0)
Hemoglobin: 12.2 g/dL (ref 12.0–15.0)
Potassium: 3.8 mmol/L (ref 3.5–5.1)
Sodium: 138 mmol/L (ref 135–145)
TCO2: 21 mmol/L — ABNORMAL LOW (ref 22–32)

## 2020-09-27 LAB — TROPONIN I (HIGH SENSITIVITY)
Troponin I (High Sensitivity): 15211 ng/L (ref <18)
Troponin I (High Sensitivity): 13694 ng/L (ref <18)

## 2020-09-27 LAB — CBG MONITORING, ED: Glucose-Capillary: 150 mg/dL — ABNORMAL HIGH (ref 70–99)

## 2020-09-27 LAB — RESP PANEL BY RT-PCR (FLU A&B, COVID) ARPGX2
Influenza A by PCR: NEGATIVE
Influenza B by PCR: NEGATIVE
SARS Coronavirus 2 by RT PCR: NEGATIVE

## 2020-09-27 LAB — I-STAT BETA HCG BLOOD, ED (MC, WL, AP ONLY): I-stat hCG, quantitative: <5 m[IU]/mL (ref <5)

## 2020-09-27 LAB — APTT: aPTT: 26 seconds (ref 24–36)

## 2020-09-27 LAB — BRAIN NATRIURETIC PEPTIDE: B Natriuretic Peptide: 724 pg/mL — ABNORMAL HIGH (ref 0.0–100.0)

## 2020-09-27 LAB — ETHANOL: Alcohol, Ethyl (B): <10 mg/dL (ref <10)

## 2020-09-27 LAB — PROTIME-INR
INR: 1 (ref 0.8–1.2)
Prothrombin Time: 13.1 seconds (ref 11.4–15.2)

## 2020-09-27 IMAGING — CT CT HEAD W/O CM
3 series · 15 of 47 positions shown, 18 images · non-contrast
Comparison: None.

CLINICAL DATA: Left-sided weakness.

EXAM:
CT HEAD WITHOUT CONTRAST
TECHNIQUE: Contiguous axial images were obtained from the base of the skull
through the vertex without intravenous contrast.

[Series 2: head 5.0 h30s · axial · 0.42mm/px · z∈[-172,-27]mm · 9 of 35 slices shown, 12 images]
[im 3/35  brain]
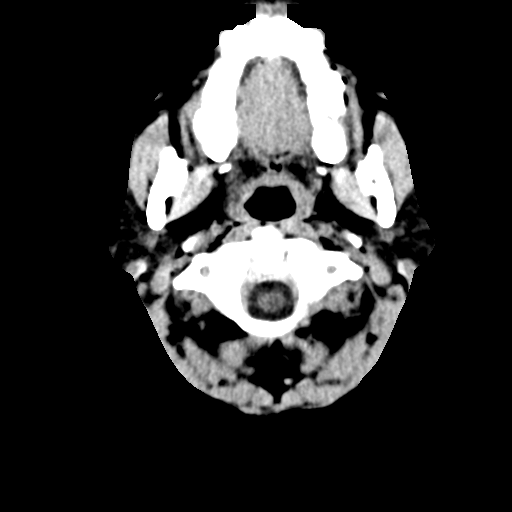
[im 3/35  bone]
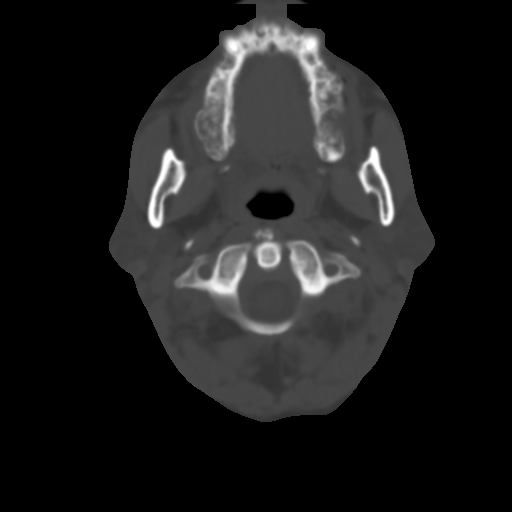
[im 6/35  brain]
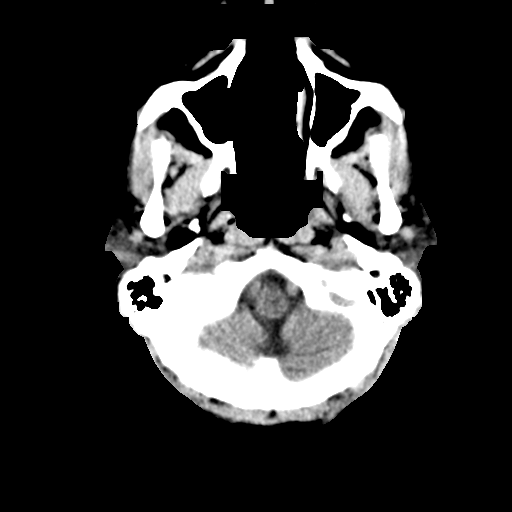
[im 10/35  brain]
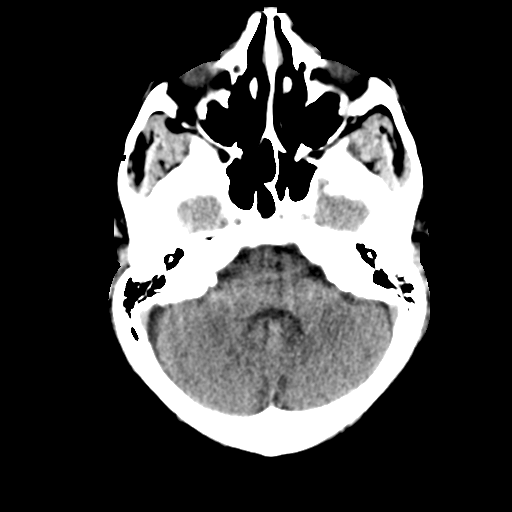
[im 13/35  brain]
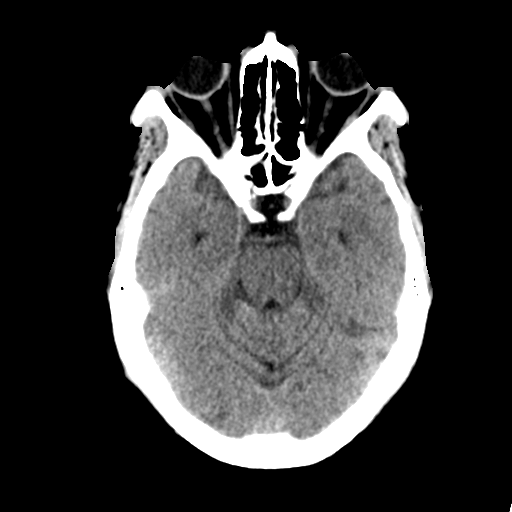
[im 18/35  brain]
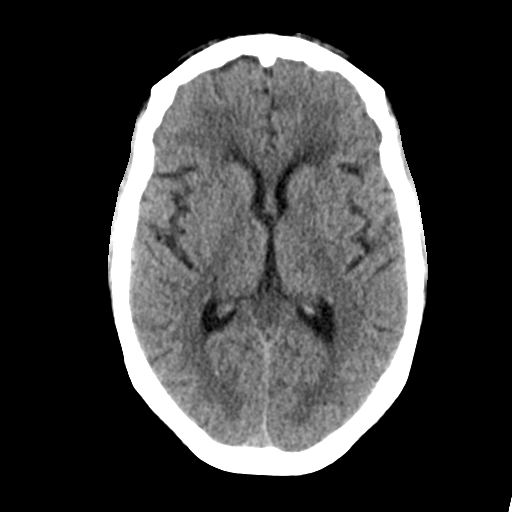
[im 18/35  bone]
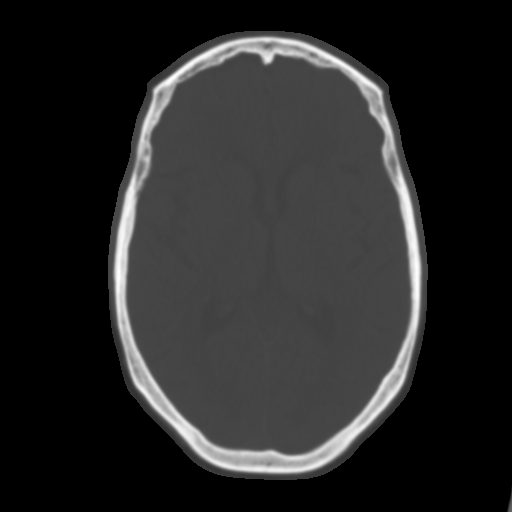
[im 22/35  brain]
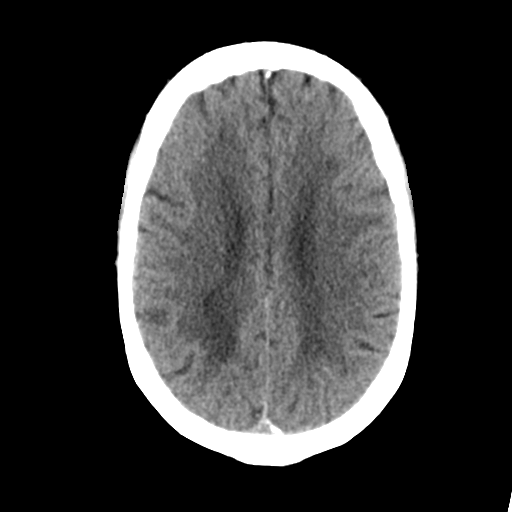
[im 25/35  brain]
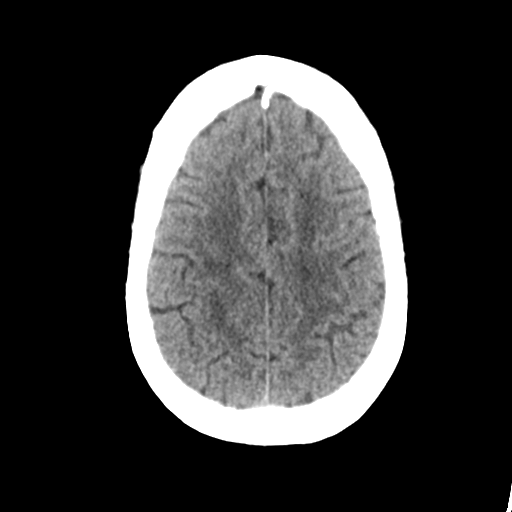
[im 29/35  brain]
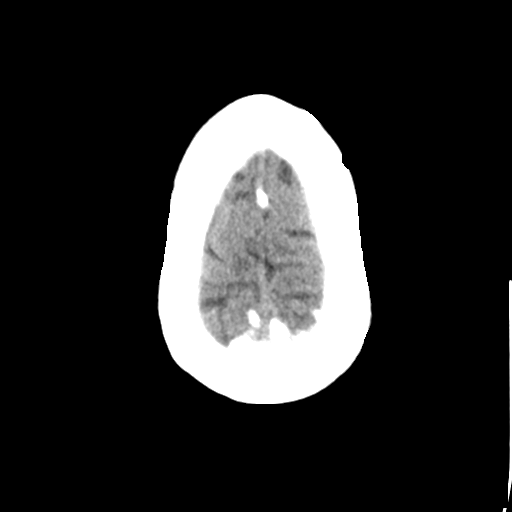
[im 32/35  brain]
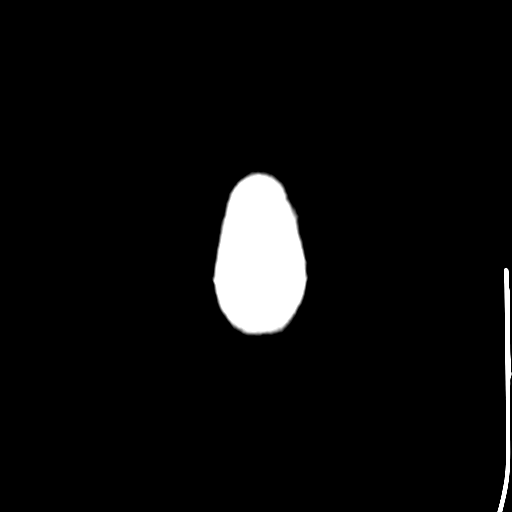
[im 32/35  bone]
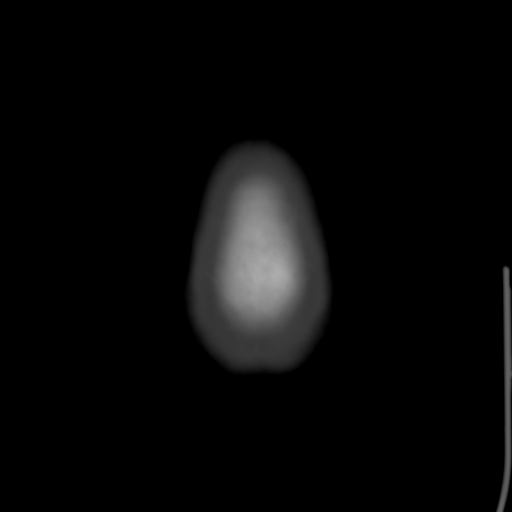

[Series 4: head 3.0 mpr cor · coronal · 0.28mm/px · 3 of 67 slices shown]
[im 23/67  brain]
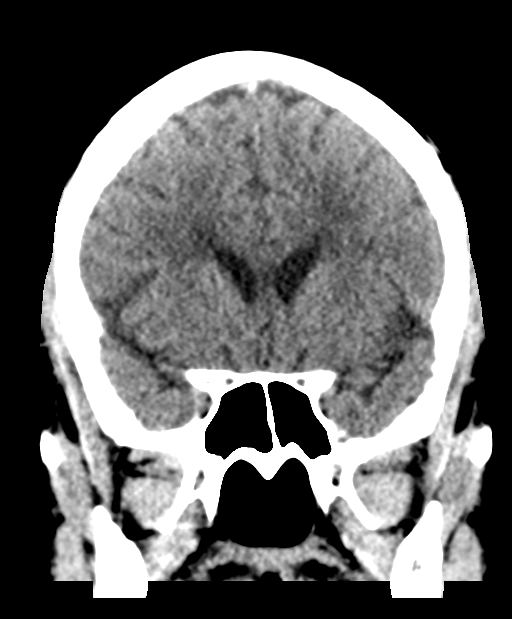
[im 30/67  brain]
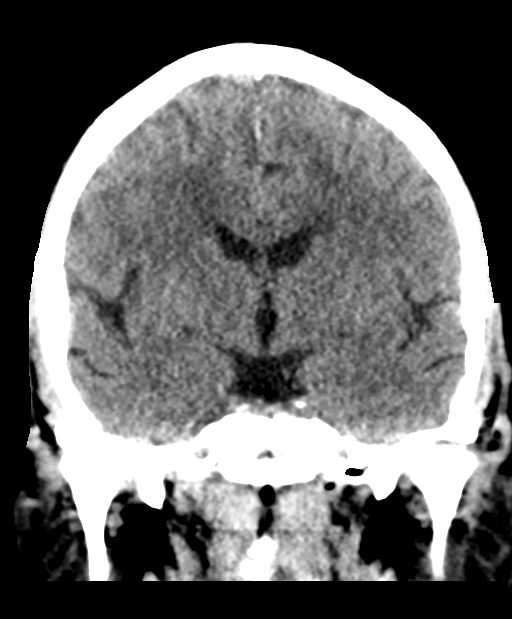
[im 37/67  brain]
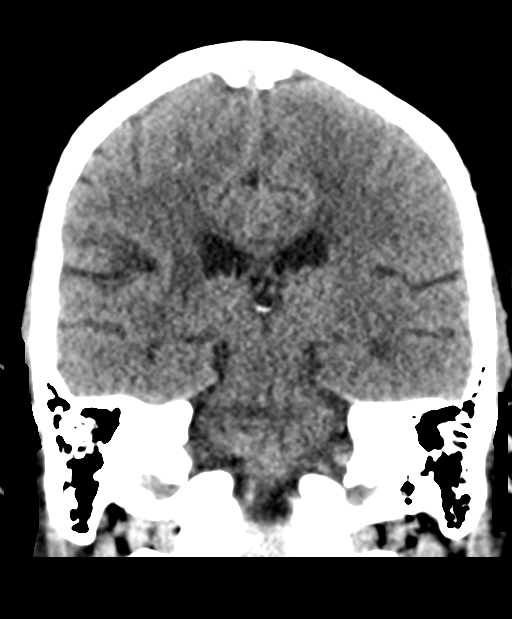

[Series 5: head 3.0 mpr sag · sagittal · 0.34mm/px · 3 of 67 slices shown]
[im 23/67  brain]
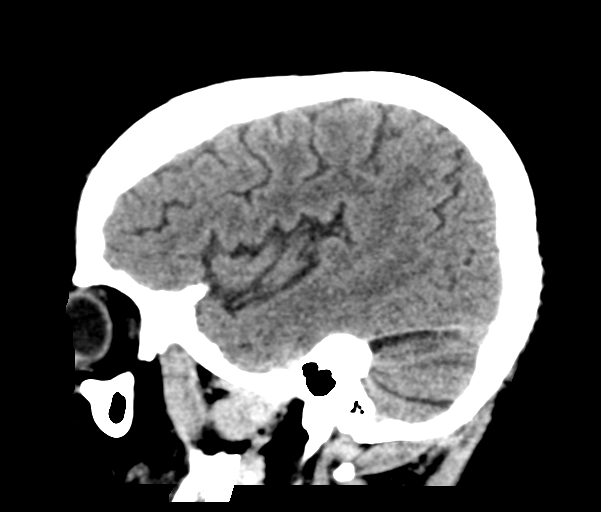
[im 34/67  brain]
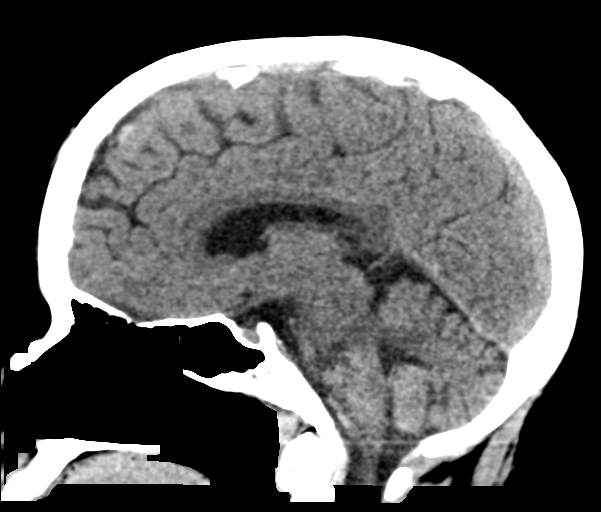
[im 45/67  brain]
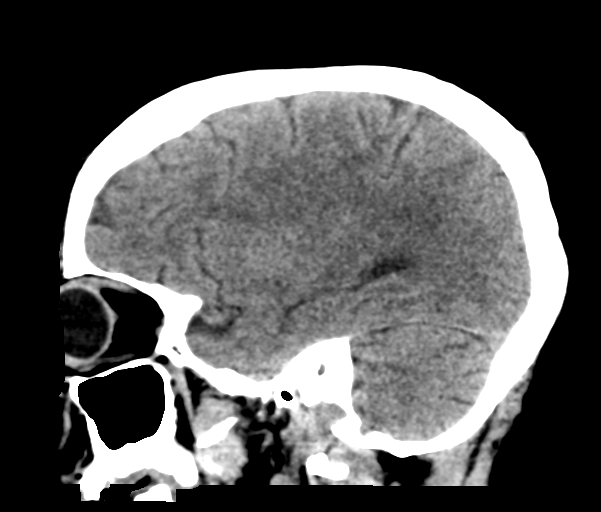

[15 of 47 positions shown; findings below may reference images not displayed]

FINDINGS: Brain: 18 x 16 x 17 mm focus of peripheral hypo attenuation is
identified in posterior right parietooccipital region (image
15/series 2). No evidence for acute hemorrhage, hydrocephalus, or
abnormal extra-axial fluid collection. Patchy low attenuation in the
deep hemispheric and periventricular white matter is nonspecific,
but likely reflects chronic microvascular ischemic demyelination.

Vascular: No hyperdense vessel or unexpected calcification.

Skull: No evidence for fracture. No worrisome lytic or sclerotic
lesion.

Sinuses/Orbits: The visualized paranasal sinuses and mastoid air
cells are clear. Visualized portions of the globes and intraorbital
fat are unremarkable.

Other: None.
IMPRESSION: 1. 18 x 16 x 17 mm focus of peripheral hypo attenuation in the
posterior right parietooccipital region. This is nonspecific and
could be related to subacute infarct, but given the somewhat rounded
configuration, MRI of the brain without and with contrast
recommended to further evaluate.
2. Chronic small vessel white matter ischemic disease.

## 2020-09-27 SURGERY — INVASIVE LAB ABORTED CASE

## 2020-09-27 MED ORDER — SPIRONOLACTONE 25 MG PO TABS
25.0000 mg | ORAL_TABLET | Freq: Every day | ORAL | Status: DC
  Filled 2020-09-27 (×2): qty 1

## 2020-09-27 MED ORDER — TICAGRELOR 90 MG PO TABS
90.0000 mg | ORAL_TABLET | Freq: Two times a day (BID) | ORAL | Status: DC
  Administered 2020-09-28 – 2020-10-01 (×9): 90 mg via ORAL
  Filled 2020-09-27 (×9): qty 1

## 2020-09-27 MED ORDER — STROKE: EARLY STAGES OF RECOVERY BOOK: Freq: Once | Status: DC

## 2020-09-27 MED ORDER — LORAZEPAM 2 MG/ML IJ SOLN
1.0000 mg | Freq: Once | INTRAMUSCULAR | Status: AC
  Administered 2020-09-28: 1 mg via INTRAVENOUS
  Filled 2020-09-27: qty 1

## 2020-09-27 MED ORDER — ACETAMINOPHEN 650 MG RE SUPP: 650.0000 mg | RECTAL | Status: DC | PRN

## 2020-09-27 MED ORDER — SACUBITRIL-VALSARTAN 24-26 MG PO TABS
1.0000 | ORAL_TABLET | Freq: Two times a day (BID) | ORAL | Status: DC
  Administered 2020-09-28: 1 via ORAL
  Filled 2020-09-27 (×3): qty 1

## 2020-09-27 MED ORDER — INSULIN ASPART 100 UNIT/ML IJ SOLN: 0.0000 [IU] | Freq: Every day | INTRAMUSCULAR | Status: DC

## 2020-09-27 MED ORDER — ASPIRIN EC 81 MG PO TBEC
81.0000 mg | DELAYED_RELEASE_TABLET | Freq: Every day | ORAL | Status: DC
  Administered 2020-09-28 – 2020-10-01 (×4): 81 mg via ORAL
  Filled 2020-09-27 (×4): qty 1

## 2020-09-27 MED ORDER — ROSUVASTATIN CALCIUM 20 MG PO TABS
40.0000 mg | ORAL_TABLET | Freq: Every day | ORAL | Status: DC
  Administered 2020-09-28 – 2020-10-01 (×4): 40 mg via ORAL
  Filled 2020-09-27 (×4): qty 2

## 2020-09-27 MED ORDER — FUROSEMIDE 10 MG/ML IJ SOLN
20.0000 mg | INTRAMUSCULAR | Status: AC
  Administered 2020-09-27: 20 mg via INTRAVENOUS
  Filled 2020-09-27: qty 2

## 2020-09-27 MED ORDER — ENOXAPARIN SODIUM 40 MG/0.4ML IJ SOSY
40.0000 mg | PREFILLED_SYRINGE | INTRAMUSCULAR | Status: DC
  Administered 2020-09-28 – 2020-10-01 (×4): 40 mg via SUBCUTANEOUS
  Filled 2020-09-27 (×4): qty 0.4

## 2020-09-27 MED ORDER — INSULIN ASPART 100 UNIT/ML IJ SOLN
0.0000 [IU] | Freq: Three times a day (TID) | INTRAMUSCULAR | Status: DC
  Administered 2020-09-28 – 2020-10-01 (×7): 1 [IU] via SUBCUTANEOUS
  Administered 2020-10-01: 2 [IU] via SUBCUTANEOUS

## 2020-09-27 MED ORDER — CALCIUM CARBONATE ANTACID 500 MG PO CHEW
2.0000 | CHEWABLE_TABLET | Freq: Once | ORAL | Status: AC
  Administered 2020-09-27: 400 mg via ORAL
  Filled 2020-09-27: qty 2

## 2020-09-27 MED ORDER — ROSUVASTATIN CALCIUM 5 MG PO TABS: 10.0000 mg | ORAL_TABLET | Freq: Every day | ORAL | Status: DC

## 2020-09-27 MED ORDER — ACETAMINOPHEN 160 MG/5ML PO SOLN: 650.0000 mg | ORAL | Status: DC | PRN

## 2020-09-27 MED ORDER — CARVEDILOL 12.5 MG PO TABS
12.5000 mg | ORAL_TABLET | Freq: Two times a day (BID) | ORAL | Status: DC
  Administered 2020-09-28: 12.5 mg via ORAL
  Filled 2020-09-27: qty 1

## 2020-09-27 MED ORDER — ACETAMINOPHEN 325 MG PO TABS
650.0000 mg | ORAL_TABLET | ORAL | Status: DC | PRN
  Administered 2020-10-02: 650 mg via ORAL
  Filled 2020-09-27: qty 2

## 2020-09-27 SURGICAL SUPPLY — 5 items
KIT HEART LEFT (KITS) IMPLANT
PACK CARDIAC CATHETERIZATION (CUSTOM PROCEDURE TRAY) IMPLANT
SYR MEDRAD MARK 7 150ML (SYRINGE) IMPLANT
TRANSDUCER W/STOPCOCK (MISCELLANEOUS) IMPLANT
TUBING CIL FLEX 10 FLL-RA (TUBING) IMPLANT

## 2020-09-27 NOTE — ED Provider Notes (Addendum)
[facility] EMERGENCY DEPARTMENT Provider Note   CSN: [id number] Arrival date & time:        History Chief Complaint  Patient presents with   Code STEMI    Tracey Meyer is a 60 y.o. female.  60 year old female with extensive past medical history below including CAD, ischemic cardiomyopathy, hypertension, hyperlipidemia, type 2 diabetes mellitus who presents as code STEMI.  Patient was discharged from the hospital 3 days ago after admission for STEMI with LAD occlusion; she had LAD stenting.  After she was discharged home, she states that she went into her house and shortly afterwards began having left-sided weakness involving arm and leg.  She has never had this symptom before and symptom has been constant since it began.  She reports normal sensation, no vision changes or speech problems.  She reports that since getting home from the hospital, she has had shortness of breath that is worse with movement and worse laying flat.  She has had to sleep on more pillows last night.  She denies any associated chest pain or lower extremity edema.  She called EMS this afternoon and EKG was abnormal therefore they called code STEMI in route.  She received aspirin and nitroglycerin x2 which relieved her shortness of breath symptoms.  She denies any cough or fever.  She denies history of stroke.  The history is provided by the patient.      Past Medical History:  Diagnosis Date   Cardiomyopathy, ischemic 09/24/2020   DM (diabetes mellitus), type 2 ([place]) 09/24/2020   HTN (hypertension)    Hyperlipidemia    Prediabetes    S/P angioplasty with stent to mLAD 09/22/20  09/24/2020   Tobacco abuse 09/24/2020   Transaminitis 09/24/2020    Patient Active Problem List   Diagnosis Date Noted   Acute ischemic stroke ([place]) 09/27/2020   HFrEF (heart failure with reduced ejection fraction) ([place]) 09/27/2020   Tobacco abuse 09/24/2020   Cardiomyopathy, ischemic 09/24/2020   S/P angioplasty  with stent to mLAD 09/22/20  09/24/2020   Transaminitis 09/24/2020   DM (diabetes mellitus), type 2 ([place]) 09/24/2020   Acute ST elevation myocardial infarction (STEMI) due to occlusion of left anterior descending (LAD) coronary artery ([place]) 09/22/2020   HTN (hypertension) 09/22/2020   Hyperlipidemia 09/22/2020   STEMI involving left anterior descending coronary artery ([place]) 09/22/2020   ST elevation myocardial infarction involving left anterior descending (LAD) coronary artery ([place]) 09/22/2020    Past Surgical History:  Procedure Laterality Date   BREAST SURGERY Left    age 46   CORONARY/GRAFT ACUTE MI REVASCULARIZATION N/A 09/22/2020   Procedure: Coronary/Graft Acute MI Revascularization;  Surgeon: Martinique, Peter M, MD;  Location: [facility];  Service: Cardiovascular;  Laterality: N/A;   LEFT HEART CATH AND CORONARY ANGIOGRAPHY N/A 09/22/2020   Procedure: LEFT HEART CATH AND CORONARY ANGIOGRAPHY;  Surgeon: Martinique, Peter M, MD;  Location: [facility];  Service: Cardiovascular;  Laterality: N/A;     OB History   No obstetric history on file.     Family History  Problem Relation Age of Onset   Cancer Mother    Heart disease Brother     Social History   Tobacco Use   Smoking status: Every Day    Packs/day: 0.50    Pack years: 0.00    Types: Cigarettes   Smokeless tobacco: Never  Substance Use Topics   Drug use: Never    Home Medications Prior to Admission medications  Medication Sig Start Date End Date Taking? Authorizing Provider  acetaminophen (TYLENOL) 325 MG tablet Take 2 tablets (650 mg total) by mouth every 4 (four) hours as needed for headache or mild pain. 09/24/20  Yes Isaiah Serge, NP  aspirin EC 81 MG EC tablet Take 1 tablet (81 mg total) by mouth daily. Swallow whole. 09/25/20  Yes Isaiah Serge, NP  calcium carbonate (TUMS - DOSED IN MG ELEMENTAL CALCIUM) 500 MG chewable tablet Chew 2 tablets by mouth daily as needed for indigestion or heartburn.    Yes [provider]  carvedilol (COREG) 12.5 MG tablet Take 1 tablet (12.5 mg total) by mouth 2 (two) times daily with a meal. 09/24/20  Yes Isaiah Serge, NP  Glycerin-Hypromellose-PEG 400 (VISINE DRY EYE OP) Place 1 drop into both eyes daily as needed (dry eyes).   Yes [provider]  metFORMIN (GLUCOPHAGE) 500 MG tablet Take 1 tablet (500 mg total) by mouth 2 (two) times daily with a meal. 09/24/20  Yes Isaiah Serge, NP  nitroGLYCERIN (NITROSTAT) 0.4 MG SL tablet Place 1 tablet (0.4 mg total) under the tongue every 5 (five) minutes x 3 doses as needed for chest pain. 09/24/20  Yes Isaiah Serge, NP  rosuvastatin (CRESTOR) 10 MG tablet Take 1 tablet (10 mg total) by mouth daily. 09/25/20  Yes Isaiah Serge, NP  sacubitril-valsartan (ENTRESTO) 24-26 MG Take 1 tablet by mouth 2 (two) times daily. 09/24/20  Yes Isaiah Serge, NP  spironolactone (ALDACTONE) 25 MG tablet Take 1 tablet (25 mg total) by mouth daily. 09/25/20  Yes Isaiah Serge, NP  ticagrelor (BRILINTA) 90 MG TABS tablet Take 1 tablet (90 mg total) by mouth 2 (two) times daily. 09/24/20  Yes Isaiah Serge, NP    Allergies    Patient has no known allergies.  Review of Systems   Review of Systems All other systems reviewed and are negative except that which was mentioned in HPI  Physical Exam Updated Vital Signs BP (!) 111/47   Pulse 67   Temp 98.4 F (36.9 C) (Oral)   Resp (!) 24   Ht 5\' 4"  (1.626 m)   Wt 79.4 kg   SpO2 96%   BMI 30.04 kg/m   Physical Exam Constitutional:      General: She is not in acute distress.    Appearance: Normal appearance.  HENT:     Head: Normocephalic and atraumatic.  Eyes:     Conjunctiva/sclera: Conjunctivae normal.  Cardiovascular:     Rate and Rhythm: Normal rate and regular rhythm.     Heart sounds: Normal heart sounds. No murmur heard. Pulmonary:     Effort: Pulmonary effort is normal.     Comments: Faint crackles in bases Abdominal:     General: Abdomen is  flat. Bowel sounds are normal. There is no distension.     Palpations: Abdomen is soft.     Tenderness: There is no abdominal tenderness.  Musculoskeletal:     Right lower leg: No edema.     Left lower leg: No edema.  Skin:    General: Skin is warm and dry.  Neurological:     Mental Status: She is alert and oriented to person, place, and time.     Cranial Nerves: No cranial nerve deficit.     Sensory: No sensory deficit.     Motor: Weakness present.     Comments: Fluent speech, CN II-XII intact, 5/5 strength RUE, 2/5 strength LUE;  5/5 strength RLE, 2/5 strength LLE; sensation intact throughout; 2+ patellar DTR R, 3+ patellar DTR L; no clonus  Psychiatric:        Mood and Affect: Mood normal.        Behavior: Behavior normal.    ED Results / Procedures / Treatments   Labs (all labs ordered are listed, but only abnormal results are displayed) Labs Reviewed  CBC - Abnormal; Notable for the following components:      Result Value   RBC 3.86 (*)    All other components within normal limits  COMPREHENSIVE METABOLIC PANEL - Abnormal; Notable for the following components:   Sodium 134 (*)    CO2 18 (*)    Glucose, Bld 205 (*)    BUN 26 (*)    Creatinine, Ser 1.30 (*)    Albumin 3.0 (*)    GFR, Estimated 47 (*)    All other components within normal limits  BRAIN NATRIURETIC PEPTIDE - Abnormal; Notable for the following components:   B Natriuretic Peptide 724.0 (*)    All other components within normal limits  I-STAT CHEM 8, ED - Abnormal; Notable for the following components:   BUN 26 (*)    Creatinine, Ser 1.20 (*)    Glucose, Bld 201 (*)    TCO2 21 (*)    All other components within normal limits  CBG MONITORING, ED - Abnormal; Notable for the following components:   Glucose-Capillary 150 (*)    All other components within normal limits  TROPONIN I (HIGH SENSITIVITY) - Abnormal; Notable for the following components:   Troponin I (High Sensitivity) 15,211 (*)    All other  components within normal limits  TROPONIN I (HIGH SENSITIVITY) - Abnormal; Notable for the following components:   Troponin I (High Sensitivity) 13,694 (*)    All other components within normal limits  RESP PANEL BY RT-PCR (FLU A&B, COVID) ARPGX2  PROTIME-INR  APTT  DIFFERENTIAL  ETHANOL  RAPID URINE DRUG SCREEN, HOSP PERFORMED  URINALYSIS, ROUTINE W REFLEX MICROSCOPIC  HEMOGLOBIN A1C  LIPID PANEL  I-STAT BETA HCG BLOOD, ED ([facility], [facility], [facility] ONLY)    EKG EKG Interpretation  Date/Time:  Thursday September 27 2020 19:03:25 EDT Ventricular Rate:  70 PR Interval:  152 QRS Duration: 91 QT Interval:  410 QTC Calculation: 443 R Axis:   16 Text Interpretation: Sinus rhythm Probable left atrial enlargement Left ventricular hypertrophy Probable anterolateral infarct, acute Abnormal T, consider ischemia, lateral leads ST elevation, consider inferior injury evolution of previous acute MI, similar to previous Confirmed by Theotis Burrow [phone]) on 09/27/2020 7:25:55 PM  Radiology CT HEAD WO CONTRAST  Result Date: 09/27/2020 CLINICAL DATA:  Left-sided weakness. EXAM: CT HEAD WITHOUT CONTRAST TECHNIQUE: Contiguous axial images were obtained from the base of the skull through the vertex without intravenous contrast. COMPARISON:  None. FINDINGS: Brain: 18 x 16 x 17 mm focus of peripheral hypo attenuation is identified in posterior right parietooccipital region (image 15/series 2). No evidence for acute hemorrhage, hydrocephalus, or abnormal extra-axial fluid collection. Patchy low attenuation in the deep hemispheric and periventricular white matter is nonspecific, but likely reflects chronic microvascular ischemic demyelination. Vascular: No hyperdense vessel or unexpected calcification. Skull: No evidence for fracture. No worrisome lytic or sclerotic lesion. Sinuses/Orbits: The visualized paranasal sinuses and mastoid air cells are clear. Visualized portions of the globes and intraorbital fat are unremarkable.  Other: None. IMPRESSION: 1. 18 x 16 x 17 mm focus of peripheral hypo attenuation in the posterior right  parietooccipital region. This is nonspecific and could be related to subacute infarct, but given the somewhat rounded configuration, MRI of the brain without and with contrast recommended to further evaluate. 2. Chronic small vessel white matter ischemic disease. Electronically Signed   By: Misty Stanley M.D.   On: 09/27/2020 19:42   DG Chest Portable 1 View  Result Date: 09/27/2020 CLINICAL DATA:  Code STEMI.  Shortness of breath and recent MI. EXAM: PORTABLE CHEST 1 VIEW COMPARISON:  09/23/2020 FINDINGS: The heart size and mediastinal contours are within normal limits. Both lungs are clear. The visualized skeletal structures are unremarkable. IMPRESSION: No active disease. Electronically Signed   By: Lucienne Capers M.D.   On: 09/27/2020 20:02    Procedures Procedures  CRITICAL CARE Performed by: Wenda Overland Bulmaro Feagans   Total critical care time: 35 minutes  Critical care time was exclusive of separately billable procedures and treating other patients.  Critical care was necessary to treat or prevent imminent or life-threatening deterioration.  Critical care was time spent personally by me on the following activities: development of treatment plan with patient and/or surrogate as well as nursing, discussions with consultants, evaluation of patient's response to treatment, examination of patient, obtaining history from patient or surrogate, ordering and performing treatments and interventions, ordering and review of laboratory studies, ordering and review of radiographic studies, pulse oximetry and re-evaluation of patient's condition.  Medications Ordered in ED Medications  LORazepam (ATIVAN) injection 1 mg (has no administration in time range)   stroke: mapping our early stages of recovery book (has no administration in time range)  acetaminophen (TYLENOL) tablet 650 mg (has no  administration in time range)    Or  acetaminophen (TYLENOL) 160 MG/5ML solution 650 mg (has no administration in time range)    Or  acetaminophen (TYLENOL) suppository 650 mg (has no administration in time range)  enoxaparin (LOVENOX) injection 40 mg (has no administration in time range)  insulin aspart (novoLOG) injection 0-9 Units (has no administration in time range)  insulin aspart (novoLOG) injection 0-5 Units (0 Units Subcutaneous Not Given 09/27/20 2237)  ticagrelor (BRILINTA) tablet 90 mg (has no administration in time range)  carvedilol (COREG) tablet 12.5 mg (has no administration in time range)  aspirin EC tablet 81 mg (has no administration in time range)  rosuvastatin (CRESTOR) tablet 10 mg (has no administration in time range)  sacubitril-valsartan (ENTRESTO) 24-26 mg per tablet (has no administration in time range)  spironolactone (ALDACTONE) tablet 25 mg (has no administration in time range)  calcium carbonate (TUMS - dosed in mg elemental calcium) chewable tablet 400 mg of elemental calcium (400 mg of elemental calcium Oral Given 09/27/20 1950)  furosemide (LASIX) injection 20 mg (20 mg Intravenous Given 09/27/20 2143)    ED Course  I have reviewed the triage vital signs and the nursing notes.  Pertinent labs & imaging results that were available during my care of the patient were reviewed by me and considered in my medical decision making (see chart for details).    MDM Rules/Calculators/A&P                          Patient initially arrived as code STEMI and EKG shows ST elevation in precordial leads with T wave inversions in 1 and aVL.  Dr. Ellyn Hack at bedside with cardiology service.  When compared with recent EKGs from hospitalization, this pattern is similar therefore we canceled code STEMI.  More concerning is the patient's left-sided  weakness which suggests stroke.  Symptoms started 3 days ago therefore outside tPA window.  Head CT shows subacute infarct R  parietooccipital region. Discussed w/ neurology, Dr. Lorrin Goodell, who reviewed imaging and noted that because area is circular on CT, ddx includes mass. He recommended brain MRI w/ and w/o contrast and will see pt in consultation.   Patient's lab work notable for troponin of 13,000 which is downtrending from her recent hospitalization.  Chest x-ray is clear.  BNP mildly elevated at 724.  Dr. Ellyn Hack had stated that patient will be followed along by general cardiology service.  Discussed admission with [facility] hospitalist, Dr. Alcario Drought. Final Clinical Impression(s) / ED Diagnoses Final diagnoses:  None    Rx / DC Orders ED Discharge Orders     None        Trany Chernick, Wenda Overland, MD 09/27/20 2242    Rex Kras Wenda Overland, MD 09/27/20 2243

## 2020-09-27 NOTE — Consult Note (Signed)
Cardiology Consultation:   Patient ID: TAQUISHA PHUNG MRN: [id number]; DOB: 01/15/1961  Admit date: 09/27/2020 Date of Consult: 09/27/2020  PCP:  Merryl Hacker, No   [facility] Providers Cardiologist:  None        Patient Profile:   Tracey Meyer is a  60 year old female with history of hypertension hyperlipidemia not previously treated also with potentially borderline DM-2 , breast cancer (status post left radical mastectomy) who was recently discharged on 09/24/2020 after presenting with anterior STEMI-delayed s/p LAD stent, LV dysfunction (EF 35-40%) presented with c/o left sided weakness, orthopnea and SOB. Cardiology is consulted at the request of ER physician and Dr Alcario Drought for given HFrEF symptoms and EKG showing persistent ST elevation in the lateral leads.  History of Present Illness:   Tracey Meyer is a  60 year old female with history of hypertension hyperlipidemia not previously treated also with potentially borderline DM-2 , breast cancer (status post left radical mastectomy) who was recently discharged on 09/24/2020 after presenting with anterior STEMI-delayed s/p LAD stent, LV dysfunction (EF 35-40%) presented with c/o left sided weakness, orthopnea and SOB. Cardiology is consulted at the request of ER physician and Dr Alcario Drought.  Code STEMI was called due to ST elevation in the lateral leads. Patient was evaluated by Dr Harding(interventional cards),. Her EKG appears better than recent one a few days ago when she presented with acute STEMI on 7/1. Her STE are persistent which raises suspicion for LV aneurysm.   Per report and patient history: after recent hospitalization 7/1- 7/4 for acute MI s/p DES stent she ws doing ok until later that evening started having left sided weakness and slurred speech. In addition to these symptoms she also has been having  SOB, orthopnea, PND.  Not having peripheral edema.  She has been sleeping with multiple pillows due to SOB.  SOB is also  worse with exertion.  Also decreased UOP.  ER work up: EKG with persistent STE in lateral leads (but better than recent EKGs), Cancelled CODE stemi. Vitals- HTNsive CT head shows possible CVA Trop 15k->13l (Downtrending from recent 24k) BNP 724 CXR: mild congestion   Past Medical History:  Diagnosis Date   Cardiomyopathy, ischemic 09/24/2020   DM (diabetes mellitus), type 2 ([place]) 09/24/2020   HTN (hypertension)    Hyperlipidemia    Prediabetes    S/P angioplasty with stent to mLAD 09/22/20  09/24/2020   Tobacco abuse 09/24/2020   Transaminitis 09/24/2020    Past Surgical History:  Procedure Laterality Date   BREAST SURGERY Left    age 89   CORONARY/GRAFT ACUTE MI REVASCULARIZATION N/A 09/22/2020   Procedure: Coronary/Graft Acute MI Revascularization;  Surgeon: Martinique, Peter M, MD;  Location: [facility];  Service: Cardiovascular;  Laterality: N/A;   LEFT HEART CATH AND CORONARY ANGIOGRAPHY N/A 09/22/2020   Procedure: LEFT HEART CATH AND CORONARY ANGIOGRAPHY;  Surgeon: Martinique, Peter M, MD;  Location: [facility];  Service: Cardiovascular;  Laterality: N/A;     Home Medications:  Prior to Admission medications   Medication Sig Start Date End Date Taking? Authorizing Provider  acetaminophen (TYLENOL) 325 MG tablet Take 2 tablets (650 mg total) by mouth every 4 (four) hours as needed for headache or mild pain. 09/24/20  Yes Isaiah Serge, NP  aspirin EC 81 MG EC tablet Take 1 tablet (81 mg total) by mouth daily. Swallow whole. 09/25/20  Yes Isaiah Serge, NP  calcium carbonate (TUMS - DOSED IN MG ELEMENTAL CALCIUM) 500 MG  chewable tablet Chew 2 tablets by mouth daily as needed for indigestion or heartburn.   Yes [provider]  carvedilol (COREG) 12.5 MG tablet Take 1 tablet (12.5 mg total) by mouth 2 (two) times daily with a meal. 09/24/20  Yes Isaiah Serge, NP  Glycerin-Hypromellose-PEG 400 (VISINE DRY EYE OP) Place 1 drop into both eyes daily as needed (dry eyes).   Yes  [provider]  metFORMIN (GLUCOPHAGE) 500 MG tablet Take 1 tablet (500 mg total) by mouth 2 (two) times daily with a meal. 09/24/20  Yes Isaiah Serge, NP  nitroGLYCERIN (NITROSTAT) 0.4 MG SL tablet Place 1 tablet (0.4 mg total) under the tongue every 5 (five) minutes x 3 doses as needed for chest pain. 09/24/20  Yes Isaiah Serge, NP  rosuvastatin (CRESTOR) 10 MG tablet Take 1 tablet (10 mg total) by mouth daily. 09/25/20  Yes Isaiah Serge, NP  sacubitril-valsartan (ENTRESTO) 24-26 MG Take 1 tablet by mouth 2 (two) times daily. 09/24/20  Yes Isaiah Serge, NP  spironolactone (ALDACTONE) 25 MG tablet Take 1 tablet (25 mg total) by mouth daily. 09/25/20  Yes Isaiah Serge, NP  ticagrelor (BRILINTA) 90 MG TABS tablet Take 1 tablet (90 mg total) by mouth 2 (two) times daily. 09/24/20  Yes Isaiah Serge, NP    Inpatient Medications: Scheduled Meds:   stroke: mapping our early stages of recovery book   Does not apply Once   [START ON 09/28/2020] aspirin EC  81 mg Oral Daily   [START ON 09/28/2020] carvedilol  12.5 mg Oral BID WC   [START ON 09/28/2020] enoxaparin (LOVENOX) injection  40 mg Subcutaneous Q24H   insulin aspart  0-5 Units Subcutaneous QHS   [START ON 09/28/2020] insulin aspart  0-9 Units Subcutaneous TID WC   LORazepam  1 mg Intravenous Once   [START ON 09/28/2020] rosuvastatin  40 mg Oral Daily   sacubitril-valsartan  1 tablet Oral BID   [START ON 09/28/2020] spironolactone  25 mg Oral Daily   ticagrelor  90 mg Oral BID   Continuous Infusions:  PRN Meds: acetaminophen **OR** acetaminophen (TYLENOL) oral liquid 160 mg/5 mL **OR** acetaminophen  Allergies:   No Known Allergies  Social History:   Social History   Socioeconomic History   Marital status: Single    Spouse name: Not on file   Number of children: Not on file   Years of education: Not on file   Highest education level: Not on file  Occupational History   Not on file  Tobacco Use   Smoking status: Every Day     Packs/day: 0.50    Pack years: 0.00    Types: Cigarettes   Smokeless tobacco: Never  Substance and Sexual Activity   Alcohol use: Not on file   Drug use: Never   Sexual activity: Not on file  Other Topics Concern   Not on file  Social History Narrative   Not on file   Social Determinants of Health   Financial Resource Strain: Not on file  Food Insecurity: Not on file  Transportation Needs: Not on file  Physical Activity: Not on file  Stress: Not on file  Social Connections: Not on file  Intimate Partner Violence: Not on file    Family History:   As below Family History  Problem Relation Age of Onset   Cancer Mother    Heart disease Brother      ROS:  Please see the history of present illness.  As noted in HPI All other ROS reviewed and negative.     Physical Exam/Data:   Vitals:   09/27/20 1935 09/27/20 1945 09/27/20 2000 09/27/20 2015  BP:  115/68 117/62 (!) 111/47  Pulse:  61 60 67  Resp:  (!) 21 20 (!) 24  Temp:      TempSrc:      SpO2:  97% 97% 96%  Weight: 79.4 kg     Height: 5\' 4"  (1.626 m)      No intake or output data in the 24 hours ending 09/27/20 2348 Last 3 Weights 09/27/2020  Weight (lbs) 175 lb  Weight (kg) 79.379 kg     Body mass index is 30.04 kg/m.  General:  Well nourished, well developed, in no acute distress HEENT: normal Lymph: no adenopathy Neck: no JVD Endocrine:  No thryomegaly Vascular: No carotid bruits; FA pulses 2+ bilaterally without bruits  Cardiac:  normal S1, S2; RRR; no murmur  Lungs:  clear to auscultation bilaterally, no wheezing, rhonchi or rales  Abd: soft, nontender, no hepatomegaly  Ext: no edema Musculoskeletal:  left sided weakness both in upper and lower extremity Skin: warm and dry  Neuro:  eft sided weakness both in upper and lower extremity, no facial droop. Psych:  Normal affect    Laboratory Data:  High Sensitivity Troponin:   Recent Labs  Lab 09/22/20 1233 09/27/20 1900 09/27/20 2116   TROPONINIHS >24,000* 15,211* 13,694*     Chemistry Recent Labs  Lab 09/23/20 0134 09/24/20 0317 09/27/20 1900 09/27/20 1950  NA 132* 133* 134* 138  K 3.9 3.4* 3.8 3.8  CL 100 104 104 108  CO2 22 21* 18*  --   GLUCOSE 194* 142* 205* 201*  BUN 28* 27* 26* 26*  CREATININE 1.14* 1.09* 1.30* 1.20*  CALCIUM 9.7 9.3 9.7  --   GFRNONAA 55* 59* 47*  --   ANIONGAP 10 8 12   --     Recent Labs  Lab 09/24/20 0317 09/27/20 1900  PROT 6.8 6.8  ALBUMIN 2.9* 3.0*  AST 163* 28  ALT 55* 26  ALKPHOS 48 47  BILITOT 1.1 0.5   Hematology Recent Labs  Lab 09/23/20 0134 09/24/20 0317 09/27/20 1900 09/27/20 1950  WBC 12.9* 10.3 6.9  --   RBC 4.11 4.20 3.86*  --   HGB 12.7 13.1 12.1 12.2  HCT 38.3 38.5 36.4 36.0  MCV 93.2 91.7 94.3  --   MCH 30.9 31.2 31.3  --   MCHC 33.2 34.0 33.2  --   RDW 13.8 13.3 13.1  --   PLT 249 287 352  --    BNP Recent Labs  Lab 09/27/20 1916  BNP 724.0*    DDimer No results for input(s): DDIMER in the last 168 hours.   Radiology/Studies:  CT HEAD WO CONTRAST  Result Date: 09/27/2020 CLINICAL DATA:  Left-sided weakness. EXAM: CT HEAD WITHOUT CONTRAST TECHNIQUE: Contiguous axial images were obtained from the base of the skull through the vertex without intravenous contrast. COMPARISON:  None. FINDINGS: Brain: 18 x 16 x 17 mm focus of peripheral hypo attenuation is identified in posterior right parietooccipital region (image 15/series 2). No evidence for acute hemorrhage, hydrocephalus, or abnormal extra-axial fluid collection. Patchy low attenuation in the deep hemispheric and periventricular white matter is nonspecific, but likely reflects chronic microvascular ischemic demyelination. Vascular: No hyperdense vessel or unexpected calcification. Skull: No evidence for fracture. No worrisome lytic or sclerotic lesion. Sinuses/Orbits: The visualized paranasal sinuses and mastoid air cells  are clear. Visualized portions of the globes and intraorbital fat are  unremarkable. Other: None. IMPRESSION: 1. 18 x 16 x 17 mm focus of peripheral hypo attenuation in the posterior right parietooccipital region. This is nonspecific and could be related to subacute infarct, but given the somewhat rounded configuration, MRI of the brain without and with contrast recommended to further evaluate. 2. Chronic small vessel white matter ischemic disease. Electronically Signed   By: Misty Stanley M.D.   On: 09/27/2020 19:42   DG Chest Portable 1 View  Result Date: 09/27/2020 CLINICAL DATA:  Code STEMI.  Shortness of breath and recent MI. EXAM: PORTABLE CHEST 1 VIEW COMPARISON:  09/23/2020 FINDINGS: The heart size and mediastinal contours are within normal limits. Both lungs are clear. The visualized skeletal structures are unremarkable. IMPRESSION: No active disease. Electronically Signed   By: Lucienne Capers M.D.   On: 09/27/2020 20:02     EKG:  The EKG was personally reviewed and demonstrates:   NSR with persistent ST elevation in V3-V5  Telemetry:  Telemetry was personally reviewed and demonstrates:  NSR   Relevant CV Studies: ECHO: 09/22/20 IMPRESSIONS     1. Apex well-visualized without contrast - false tendon (normal variant)  - no thrombus. Left ventricular ejection fraction, by estimation, is 35 to  40%. The left ventricle has moderately decreased function. The left  ventricle demonstrates regional wall  motion abnormalities (see scoring diagram/findings for description). There  is mild left ventricular hypertrophy. Left ventricular diastolic  parameters are consistent with Grade I diastolic dysfunction (impaired  relaxation). Elevated left ventricular  end-diastolic pressure. There is severe hypokinesis of the left  ventricular, entire anterior wall, anteroseptal wall, apical segment and  inferoapical segment. Findings suggest LAD territory ischemia/infarct.   2. Right ventricular systolic function is normal. The right ventricular  size is normal. There  is normal pulmonary artery systolic pressure.   3. The mitral valve is abnormal. Mild mitral valve regurgitation.   4. The aortic valve is tricuspid. Aortic valve regurgitation is not  visualized.   5. The inferior vena cava is normal in size with <50% respiratory  variability, suggesting right atrial pressure of 8 mmHg.   CATH: 09/22/20 RPDA lesion is 50% stenosed. Dist Cx lesion is 30% stenosed with 30% stenosed side branch in LPAV. 1st Diag lesion is 60% stenosed. Mid LAD lesion is 100% stenosed. Post intervention, there is a 0% residual stenosis. A drug-eluting stent was successfully placed using a STENT ONYX FRONTIER 2.5X38. LV end diastolic pressure is mildly elevated.   1. 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 Mildly elevated LVEDP 21 mmHg 3. Successful PCI of the mid LAD with DES x 1   Plan: DAPT for one year. I suspect she will have significant myocardial injury given late presentation. Will check Echo. Aggressive risk factor modification.     Assessment and Plan:   Acute ischemic stroke (likely cardioembolic) delayed presentation Acute HFrEF, LVEF 35-40% Recent anterior STEMI s/p PCI DES to LAD Persistent STE in lateral leads likely due to possible LV aneurysm (?apical) HTN, HLD, DM-2  Plan: - patient is admitted under hospitalist team and stroke work up is underway On exam she is euvolemic and BP is high (permissive HTN I suppose but she is late presentation of CVA).  EKG reviewed, code STEMI cancelled, persistent STE likely from aneurysm  - ok to hold off Lasix for now as clinically she appears euvolemic GDMT: restart all home meds: DAPT (aspirin plus brilinta),  coreg 12.5mg  BID, entresto 24/26mg  BID, spironolactone 25mg  daily and rosuva 40mg  daily. Could consider SGTL-2i as outpatient basis - repeat a limited ECHO with contrast to evaluate LV apex (suspicious that she may have LV thrombus). Consider anticoagulation and discuss with Neurology. -  aggressive risk factor modification needed, pt/ot/rehab  Risk Assessment/Risk Scores:        New York Heart Association (NYHA) Functional Class NYHA Class II        For questions or updates, please contact [facility] Please consult [email or web address] for contact info under    Signed, Renae Fickle, MD  09/27/2020 11:48 PM

## 2020-09-27 NOTE — ED Notes (Signed)
Little MD made aware of critical troponin level.

## 2020-09-27 NOTE — Telephone Encounter (Signed)
Spoke with pt pharmacist, the patient is only able to afford about $100 per month for medication. The brilinta co-pay card will only pay $50 dollars toward the total cost. Will forward to the care coordinator here in the office.

## 2020-09-27 NOTE — Telephone Encounter (Signed)
Left message for pt to call.

## 2020-09-27 NOTE — Consult Note (Signed)
   BRIEF Interventional Cardiology Note  Code STEMI was called on [name].  She is a 60 year old female with history of hypertension hyperlipidemia not previously treated also with potentially borderline DM-2 (status post left radical mastectomy) who was recently discharged on 09/24/2020 after presenting at 7 PM on 09/21/2020 with anterior STEMI-delayed presentation.  Symptoms were nausea vomiting and diarrhea with left upper quadrant pain.  She was found to have diffusely occluded LAD treated with DES stent.  She did have moderate ischemic cardiomyopathy with EF 35 to 40% with anterior hypokinesis but no obvious evidence of apical thrombus.  She was discharged on 09/24/2020 presumably in stable condition with no comment about any neurologic deficits.  She was given a 30-day free voucher for Brilinta, but she has 1 tablet left for tonight and has not yet taken it.  She is indicated that she cannot afford taking medicine-obviously has not had the 3 months filled.  There are also questions from a financial standpoint about having Entresto and Crestor filled as well.  She presents here this evening via EMS with code STEMI being called.  Her discharge EKG had significant residual ST elevations in the anterior leads with T wave inversions consistent with evolutionary changes of anterior MI.  Today her presenting EKG shows actually improved ST elevations with less prominent T wave versions.  She is not actively having any of the presenting symptoms of abdominal pain or nausea.  Her major complaint has been several days of worsening dyspnea orthopnea and PND, but has not noted any swelling.  She also notes that she has not been urinating much in the last couple days.  On my evaluation on her EKG and clinically she does not appear to be having an anterior STEMI.  I suspect that she is actually having symptoms of heart failure from likely volume overload and probably requires diuresis.  I am a bit concerned that  she has not had a urine output and would like the weight labs return to see if her renal function, prior to diuresis.  Additionally, on exam and her history, she indicates that on Monday (which would be 09/24/2020) she noted that she was not able to move her left arm or left leg.  There has been some report about a prior stroke, and there was also report that EMS was told by the family that she had had a stroke during her last hospitalization which is not reported in discharge summary.  On my exam she clearly has no hand strength or leg strength.    I recommend further evaluation of both neurologic function but also initiate likely heart failure evaluation.  Cardiology will be available for consultation based on initial ER evaluation of both of these conditions.  She is on a relatively stable medicine at home, needs her Brilinta given tonight.  I discussed this with the ER physician.  The plan is for him to contact us once the initial evaluation has been done for formal consultation.   Tracey Hew, MD

## 2020-09-27 NOTE — ED Triage Notes (Signed)
Pt BIB [facility] from home, pt called for SOB since Monday. Pt released from our hospital Sunday after cardiac cath.   12 lead w/elevation V3-5  BP 124/69 HR 78 RR 20 98-100% RA  CBG 190  0.4mg  Nitro SL x2 324mg  ASA 250cc NS Bolus  18G LAC

## 2020-09-27 NOTE — Telephone Encounter (Signed)
Pt c/o medication issue:  1. Name of Medication:  rosuvastatin (CRESTOR) 10 MG tablet sacubitril-valsartan (ENTRESTO) 24-26 MG ticagrelor (BRILINTA) 90 MG TABS tablet  2. How are you currently taking this medication (dosage and times per day)? Patient has not started the medications yet   3. Are you having a reaction (difficulty breathing--STAT)? no  4. What is your medication issue? Cost  Patient does not have insurance and is cash only, and on a limited income. The pharmacy wanted to know if there are any alternatives that could be sent in that the patient could afford

## 2020-09-27 NOTE — H&P (Signed)
History and Physical    Tracey Meyer TGY:[id number] DOB: Aug 22, 1960 DOA: 09/27/2020  PCP: Pcp, No  Patient coming from: Home  I have personally briefly reviewed patient's old medical records in [place]  Chief Complaint: L sided weakness, orthopnea, SOB  HPI: Tracey Meyer is a 60 y.o. female with medical history significant of DM2, HTN, HLD, smoking.  Pt is unfortunately not having a good week: On 7/1 pt presented to ED with LUQ abd pain, nausea, vomiting, diarrhea.  Symptoms determined to be due to anterior wall STEMI with trop > 24,000.  This due to occluded LAD which was treated with PCI DES.  Pt did have ischemic cardiomyopathy with EF 35-40% and anterior hypokinesis.  No obvious evidence of apical thrombus.  Pt discharged on 7/4 in stable condition.  Per pt she was doing okay until later that evening (after discharge) when (at home) patient developed onset of L sided weakness and slurred speech.  Symptoms are moderate, persistent, and unchanged since onset.  She did not seek medical care for the new neurologic symptoms until today.  Since discharge, pt has also had increasing symptoms of SOB, orthopnea, PND.  Not having peripheral edema.  She has been sleeping with multiple pillows due to SOB.  SOB is also worse with exertion.  Also decreased UOP.  No N/V/D nor abd pain today.   ED Course: CT head with hypodense area on R: ? Subacute stroke vs mass.  MRI brain pending.  Trop down to 15k.  Interventional cards eval: not having new STEMI, most likely having new onset CHF secondary to STEMI earlier this week (see his note).   Review of Systems: As per HPI, otherwise all review of systems negative.  Past Medical History:  Diagnosis Date   Cardiomyopathy, ischemic 09/24/2020   DM (diabetes mellitus), type 2 ([place]) 09/24/2020   HTN (hypertension)    Hyperlipidemia    Prediabetes    S/P angioplasty with stent to mLAD 09/22/20  09/24/2020   Tobacco abuse 09/24/2020    Transaminitis 09/24/2020    Past Surgical History:  Procedure Laterality Date   BREAST SURGERY Left    age 43   CORONARY/GRAFT ACUTE MI REVASCULARIZATION N/A 09/22/2020   Procedure: Coronary/Graft Acute MI Revascularization;  Surgeon: Martinique, Peter M, MD;  Location: [facility];  Service: Cardiovascular;  Laterality: N/A;   LEFT HEART CATH AND CORONARY ANGIOGRAPHY N/A 09/22/2020   Procedure: LEFT HEART CATH AND CORONARY ANGIOGRAPHY;  Surgeon: Martinique, Peter M, MD;  Location: [facility];  Service: Cardiovascular;  Laterality: N/A;     reports that she has been smoking cigarettes. She has been smoking an average of 0.50 packs per day. She has never used smokeless tobacco. She reports that she does not use drugs. No history on file for alcohol use.  No Known Allergies  Family History  Problem Relation Age of Onset   Cancer Mother    Heart disease Brother      Prior to Admission medications   Medication Sig Start Date End Date Taking? Authorizing Provider  acetaminophen (TYLENOL) 325 MG tablet Take 2 tablets (650 mg total) by mouth every 4 (four) hours as needed for headache or mild pain. 09/24/20   Isaiah Serge, NP  aspirin EC 81 MG EC tablet Take 1 tablet (81 mg total) by mouth daily. Swallow whole. 09/25/20   Isaiah Serge, NP  carvedilol (COREG) 12.5 MG tablet Take 1 tablet (12.5 mg total) by mouth 2 (  two) times daily with a meal. 09/24/20   Isaiah Serge, NP  metFORMIN (GLUCOPHAGE) 500 MG tablet Take 1 tablet (500 mg total) by mouth 2 (two) times daily with a meal. 09/24/20   Isaiah Serge, NP  nitroGLYCERIN (NITROSTAT) 0.4 MG SL tablet Place 1 tablet (0.4 mg total) under the tongue every 5 (five) minutes x 3 doses as needed for chest pain. 09/24/20   Isaiah Serge, NP  rosuvastatin (CRESTOR) 10 MG tablet Take 1 tablet (10 mg total) by mouth daily. 09/25/20   Isaiah Serge, NP  sacubitril-valsartan (ENTRESTO) 24-26 MG Take 1 tablet by mouth 2 (two) times daily. 09/24/20    Isaiah Serge, NP  spironolactone (ALDACTONE) 25 MG tablet Take 1 tablet (25 mg total) by mouth daily. 09/25/20   Isaiah Serge, NP  ticagrelor (BRILINTA) 90 MG TABS tablet Take 1 tablet (90 mg total) by mouth 2 (two) times daily. 09/24/20   Isaiah Serge, NP    Physical Exam: Vitals:   09/27/20 1935 09/27/20 1945 09/27/20 2000 09/27/20 2015  BP:  115/68 117/62 (!) 111/47  Pulse:  61 60 67  Resp:  (!) 21 20 (!) 24  Temp:      TempSrc:      SpO2:  97% 97% 96%  Weight: 79.4 kg     Height: 5\' 4"  (1.626 m)       Constitutional: NAD, calm, comfortable Eyes: PERRL, lids and conjunctivae normal ENMT: Mucous membranes are moist. Posterior pharynx clear of any exudate or lesions.Normal dentition.  Neck: normal, supple, no masses, no thyromegaly Respiratory: Crackles B lower lungs Cardiovascular: Regular rate and rhythm, no murmurs / rubs / gallops. No extremity edema. 2+ pedal pulses. No carotid bruits.  Abdomen: no tenderness, no masses palpated. No hepatosplenomegaly. Bowel sounds positive.  Musculoskeletal: no clubbing / cyanosis. No joint deformity upper and lower extremities. Good ROM, no contractures. Normal muscle tone.  Skin: no rashes, lesions, ulcers. No induration Neurologic: Pt with L sided weakness and slurred speech Psychiatric: Normal judgment and insight. Alert and oriented x 3. Normal mood.    Labs on Admission: I have personally reviewed following labs and imaging studies  CBC: Recent Labs  Lab 09/22/20 1233 09/23/20 0134 09/24/20 0317 09/27/20 1900 09/27/20 1950  WBC 11.7* 12.9* 10.3 6.9  --   NEUTROABS  --   --   --  4.9  --   HGB 13.9 12.7 13.1 12.1 12.2  HCT 41.0 38.3 38.5 36.4 36.0  MCV 93.2 93.2 91.7 94.3  --   PLT 337 249 287 352  --    Basic Metabolic Panel: Recent Labs  Lab 09/22/20 0940 09/22/20 1233 09/23/20 0134 09/24/20 0317 09/27/20 1900 09/27/20 1950  NA 137  --  132* 133* 134* 138  K 3.8  --  3.9 3.4* 3.8 3.8  CL 103  --  100 104  104 108  CO2  --   --  22 21* 18*  --   GLUCOSE 264*  --  194* 142* 205* 201*  BUN 18  --  28* 27* 26* 26*  CREATININE 0.80 1.10* 1.14* 1.09* 1.30* 1.20*  CALCIUM  --   --  9.7 9.3 9.7  --    GFR: Estimated Creatinine Clearance: 51.5 mL/min (A) (by C-G formula based on SCr of 1.2 mg/dL (H)). Liver Function Tests: Recent Labs  Lab 09/24/20 0317 09/27/20 1900  AST 163* 28  ALT 55* 26  ALKPHOS 48 47  BILITOT 1.1  0.5  PROT 6.8 6.8  ALBUMIN 2.9* 3.0*   No results for input(s): LIPASE, AMYLASE in the last 168 hours. No results for input(s): AMMONIA in the last 168 hours. Coagulation Profile: Recent Labs  Lab 09/27/20 1900  INR 1.0   Cardiac Enzymes: No results for input(s): CKTOTAL, CKMB, CKMBINDEX, TROPONINI in the last 168 hours. BNP (last 3 results) No results for input(s): PROBNP in the last 8760 hours. HbA1C: No results for input(s): HGBA1C in the last 72 hours. CBG: Recent Labs  Lab 09/22/20 2234 09/23/20 0703 09/23/20 1128 09/23/20 1555 09/24/20 0628  GLUCAP 197* 177* 176* 170* 138*   Lipid Profile: No results for input(s): CHOL, HDL, LDLCALC, TRIG, CHOLHDL, LDLDIRECT in the last 72 hours. Thyroid Function Tests: No results for input(s): TSH, T4TOTAL, FREET4, T3FREE, THYROIDAB in the last 72 hours. Anemia Panel: No results for input(s): VITAMINB12, FOLATE, FERRITIN, TIBC, IRON, RETICCTPCT in the last 72 hours. Urine analysis: No results found for: COLORURINE, APPEARANCEUR, LABSPEC, [place], GLUCOSEU, [place], BILIRUBINUR, KETONESUR, PROTEINUR, UROBILINOGEN, NITRITE, LEUKOCYTESUR  Radiological Exams on Admission: CT HEAD WO CONTRAST  Result Date: 09/27/2020 CLINICAL DATA:  Left-sided weakness. EXAM: CT HEAD WITHOUT CONTRAST TECHNIQUE: Contiguous axial images were obtained from the base of the skull through the vertex without intravenous contrast. COMPARISON:  None. FINDINGS: Brain: 18 x 16 x 17 mm focus of peripheral hypo attenuation is identified in posterior  right parietooccipital region (image 15/series 2). No evidence for acute hemorrhage, hydrocephalus, or abnormal extra-axial fluid collection. Patchy low attenuation in the deep hemispheric and periventricular white matter is nonspecific, but likely reflects chronic microvascular ischemic demyelination. Vascular: No hyperdense vessel or unexpected calcification. Skull: No evidence for fracture. No worrisome lytic or sclerotic lesion. Sinuses/Orbits: The visualized paranasal sinuses and mastoid air cells are clear. Visualized portions of the globes and intraorbital fat are unremarkable. Other: None. IMPRESSION: 1. 18 x 16 x 17 mm focus of peripheral hypo attenuation in the posterior right parietooccipital region. This is nonspecific and could be related to subacute infarct, but given the somewhat rounded configuration, MRI of the brain without and with contrast recommended to further evaluate. 2. Chronic small vessel white matter ischemic disease. Electronically Signed   By: Misty Stanley M.D.   On: 09/27/2020 19:42   DG Chest Portable 1 View  Result Date: 09/27/2020 CLINICAL DATA:  Code STEMI.  Shortness of breath and recent MI. EXAM: PORTABLE CHEST 1 VIEW COMPARISON:  09/23/2020 FINDINGS: The heart size and mediastinal contours are within normal limits. Both lungs are clear. The visualized skeletal structures are unremarkable. IMPRESSION: No active disease. Electronically Signed   By: Lucienne Capers M.D.   On: 09/27/2020 20:02    EKG: Independently reviewed.  Assessment/Plan Principal Problem:   Acute ischemic stroke [facility]) Active Problems:   HTN (hypertension)   Hyperlipidemia   STEMI involving left anterior descending coronary artery (HCC)   Cardiomyopathy, ischemic   S/P angioplasty with stent to mLAD 09/22/20    DM (diabetes mellitus), type 2 (HCC)   HFrEF (heart failure with reduced ejection fraction) ([place])    Acute neurologic deficits - Hypodensity on CT scan Suspect subacute ischemic  stroke DDx includes tumor based on CT appearance Stroke pathway MRI/MRA MRI with and without contrast should differentiate stroke from tumor Tele monitor 2d echo to r/o LV thrombus (also to update EF given increasing CHF symptoms over past week following STEMI). Already on ASA + Brilinta Neuro consult PT/OT/SLP ICM / HFrEF - Suspect pt developing new-onset CHF symptoms secondary  to STEMI this past week with known anterior wall hypokenisis and EF 35-40% just a couple of days ago (See Dr. Ellyn Hack note). Continue current cardiac meds for now Dr. Ellyn Hack plans on having general cardiology come consult. Holding off on starting lasix for the moment given no pulm edema on CXR HTN - 72h out from onset of stroke symptoms now, I think risks of permissive HTN in pt who is less than 1 week out from STEMI and showing signs of new onset CHF already outweigh benefits of permissive HTN. HLD - Cont statin FLP repeat May need to increase dosing DM2 - Hold metformin Sensitive SSI AC/HS  DVT prophylaxis: Lovenox Code Status: Full Family Communication: No family in room Disposition Plan: Home after neuro deficit work up and CHF work up Consults called: Dr. Lorrin Goodell and Dr. Ellyn Hack Admission status: Admit to inpatient  Severity of Illness: The appropriate patient status for this patient is INPATIENT. Inpatient status is judged to be reasonable and necessary in order to provide the required intensity of service to ensure the patient's safety. The patient's presenting symptoms, physical exam findings, and initial radiographic and laboratory data in the context of their chronic comorbidities is felt to place them at high risk for further clinical deterioration. Furthermore, it is not anticipated that the patient will be medically stable for discharge from the hospital within 2 midnights of admission. The following factors support the patient status of inpatient.   IP status due to new onset of neurologic  deficits, hypodensity on CT scan suspicious for ischemic stroke vs other.   * I certify that at the point of admission it is my clinical judgment that the patient will require inpatient hospital care spanning beyond 2 midnights from the point of admission due to high intensity of service, high risk for further deterioration and high frequency of surveillance required.*   Alynn Ellithorpe M. DO [facility]  How to contact the [facility] Attending or Consulting provider [place] or covering provider during after hours [place], for this patient?  Check the care team in [facility] and look for a) attending/consulting [facility] provider listed and b) the [facility] team listed Log into [email or web address]  Amion Physician Scheduling and messaging for groups and whole hospitals  On call and physician scheduling software for group practices, residents, hospitalists and other medical providers for call, clinic, rotation and shift schedules. On[employer] is a hospital-wide system for scheduling doctors and paging doctors on call. EasyPlot is for scientific plotting and data analysis.  [email or web address]  and use 's universal password to access. If you do not have the password, please contact the hospital operator.  Locate the [facility] provider you are looking for under [facility] and page to a number that you can be directly reached. If you still have difficulty reaching the provider, please page the [facility] (Director on Call) for the Hospitalists listed on amion for assistance.  09/27/2020, 9:31 PM

## 2020-09-28 ENCOUNTER — Encounter (HOSPITAL_COMMUNITY): Payer: Self-pay | Admitting: Internal Medicine

## 2020-09-28 ENCOUNTER — Inpatient Hospital Stay (HOSPITAL_COMMUNITY): Payer: Medicaid Other

## 2020-09-28 ENCOUNTER — Telehealth (HOSPITAL_COMMUNITY): Payer: Self-pay

## 2020-09-28 DIAGNOSIS — R531 Weakness: Secondary | ICD-10-CM | POA: Insufficient documentation

## 2020-09-28 DIAGNOSIS — I255 Ischemic cardiomyopathy: Secondary | ICD-10-CM

## 2020-09-28 DIAGNOSIS — E782 Mixed hyperlipidemia: Secondary | ICD-10-CM

## 2020-09-28 DIAGNOSIS — I5020 Unspecified systolic (congestive) heart failure: Secondary | ICD-10-CM

## 2020-09-28 DIAGNOSIS — Z95820 Peripheral vascular angioplasty status with implants and grafts: Secondary | ICD-10-CM

## 2020-09-28 DIAGNOSIS — I2102 ST elevation (STEMI) myocardial infarction involving left anterior descending coronary artery: Secondary | ICD-10-CM

## 2020-09-28 DIAGNOSIS — I10 Essential (primary) hypertension: Secondary | ICD-10-CM

## 2020-09-28 LAB — ECHOCARDIOGRAM COMPLETE
AR max vel: 1.65 cm2
AV Area VTI: 1.73 cm2
AV Area mean vel: 1.59 cm2
AV Mean grad: 3 mmHg
AV Peak grad: 5.7 mmHg
Ao pk vel: 1.19 m/s
Area-P 1/2: 4.06 cm2
Height: 64 in
S' Lateral: 3.8 cm
Weight: 2800 oz

## 2020-09-28 LAB — LIPID PANEL
Cholesterol: 186 mg/dL (ref 0–200)
HDL: 36 mg/dL — ABNORMAL LOW (ref >40)
LDL Cholesterol: 130 mg/dL — ABNORMAL HIGH (ref 0–99)
Total CHOL/HDL Ratio: 5.2 RATIO
Triglycerides: 100 mg/dL (ref <150)
VLDL: 20 mg/dL (ref 0–40)

## 2020-09-28 LAB — URINALYSIS, ROUTINE W REFLEX MICROSCOPIC
Bilirubin Urine: NEGATIVE
Glucose, UA: NEGATIVE mg/dL
Hgb urine dipstick: NEGATIVE
Ketones, ur: NEGATIVE mg/dL
Leukocytes,Ua: NEGATIVE
Nitrite: POSITIVE — AB
Protein, ur: NEGATIVE mg/dL
Specific Gravity, Urine: 1.02 (ref 1.005–1.030)
pH: 5.5 (ref 5.0–8.0)

## 2020-09-28 LAB — RAPID URINE DRUG SCREEN, HOSP PERFORMED
Amphetamines: NOT DETECTED
Barbiturates: NOT DETECTED
Benzodiazepines: NOT DETECTED
Cocaine: NOT DETECTED
Opiates: NOT DETECTED
Tetrahydrocannabinol: POSITIVE — AB

## 2020-09-28 LAB — GLUCOSE, CAPILLARY
Glucose-Capillary: 123 mg/dL — ABNORMAL HIGH (ref 70–99)
Glucose-Capillary: 126 mg/dL — ABNORMAL HIGH (ref 70–99)

## 2020-09-28 LAB — CBG MONITORING, ED
Glucose-Capillary: 131 mg/dL — ABNORMAL HIGH (ref 70–99)
Glucose-Capillary: 136 mg/dL — ABNORMAL HIGH (ref 70–99)
Glucose-Capillary: 126 mg/dL — ABNORMAL HIGH (ref 70–99)

## 2020-09-28 LAB — URINALYSIS, MICROSCOPIC (REFLEX): RBC / HPF: NONE SEEN RBC/hpf (ref 0–5)

## 2020-09-28 LAB — HEMOGLOBIN A1C
Hgb A1c MFr Bld: 6.8 % — ABNORMAL HIGH (ref 4.8–5.6)
Mean Plasma Glucose: 148.46 mg/dL

## 2020-09-28 IMAGING — MR MR MRA NECK WO/W CM
6 of 7 series · 35 of 48 positions shown · IV contrast (Gadavist)
Comparison: Prior CT from [DATE].

CLINICAL DATA: Initial evaluation for neuro deficit, stroke
suspected, left-sided weakness.

EXAM:
MRI HEAD WITHOUT AND WITH CONTRAST
MRA HEAD WITHOUT CONTRAST
MRA NECK WITHOUT AND WITH CONTRAST
TECHNIQUE: Multiplanar, multi-echo pulse sequences of the brain and surrounding
structures were acquired without intravenous contrast. Angiographic
images of the Circle of Willis were acquired using MRA technique
without intravenous contrast. Angiographic images of the neck were
acquired using MRA technique without and with intravenous contrast.
Carotid stenosis measurements (when applicable) are obtained
utilizing NASCET criteria, using the distal internal carotid
diameter as the denominator.
CONTRAST:  8mL GADAVIST GADOBUTROL 1 MMOL/ML IV SOLN

[Series 1: angio_fl3d_cor_pre_ttc=3.0s · coronal · 0.9mm · 0.85mm/px · 6 of 104 slices shown]
[im 1/104]
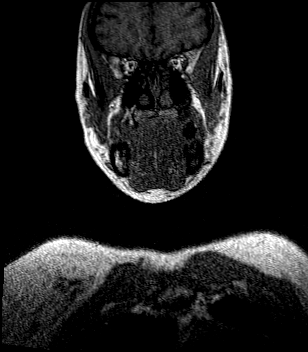
[im 21/104]
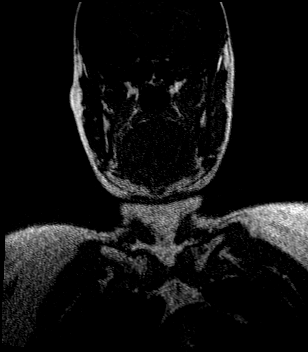
[im 42/104]
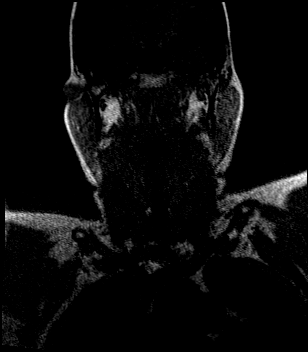
[im 62/104]
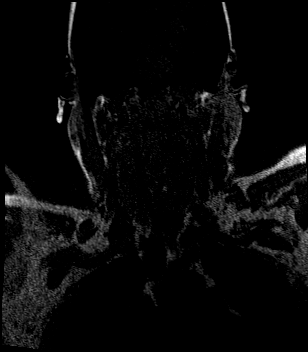
[im 83/104]
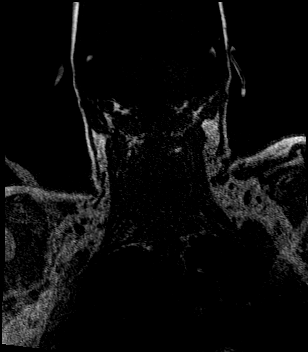
[im 104/104]
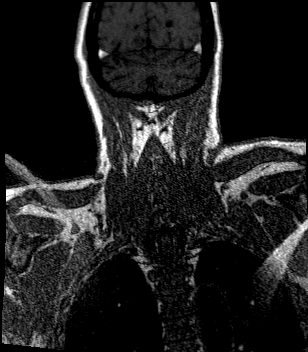

[Series 2: angio_fl3d_cor_post_ttc=3.0s · coronal · 0.9mm · 0.85mm/px · 7 of 104 slices shown (1 of 2)]
[im 1/104]
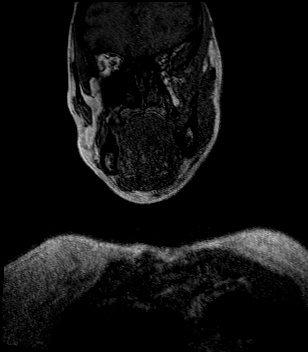
[im 18/104]
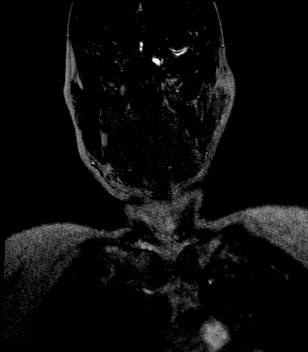
[im 35/104]
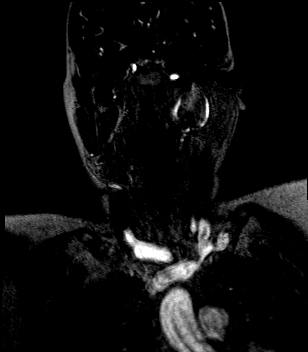
[im 52/104]
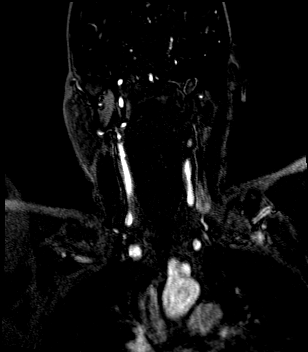
[im 69/104]
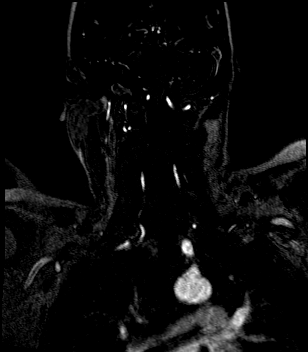
[im 86/104]
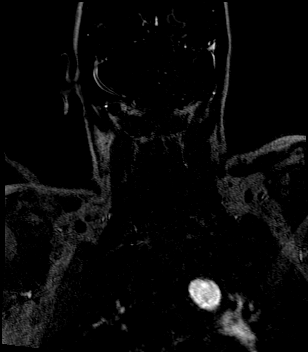
[im 104/104]
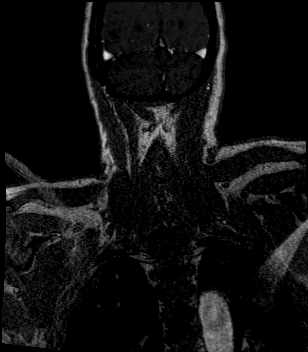

[Series 3: angio_fl3d_cor_post_ttc=3.0s_moco-adv · coronal · 0.9mm · 0.85mm/px · 7 of 104 slices shown (1 of 2)]
[im 1/104]
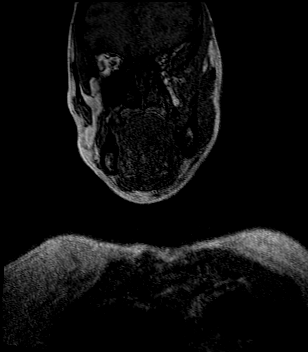
[im 18/104]
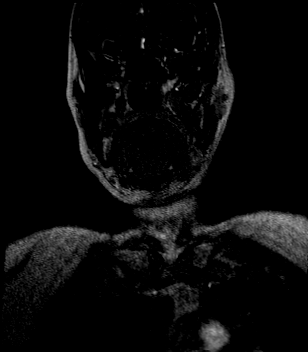
[im 35/104]
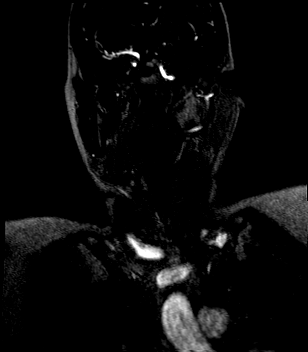
[im 52/104]
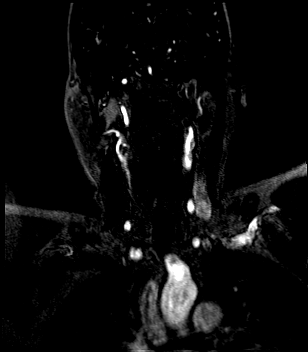
[im 69/104]
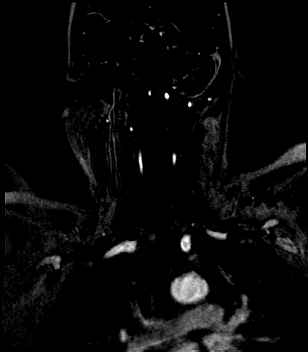
[im 86/104]
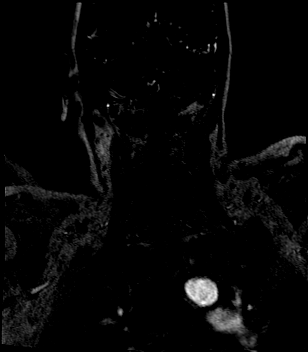
[im 104/104]
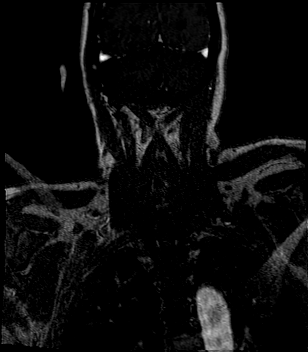

[Series 4: angio_fl3d_cor_post_ttc=3.0s_moco-adv_sub · coronal · 0.9mm · 0.85mm/px · 7 of 104 slices shown]
[im 1/104]
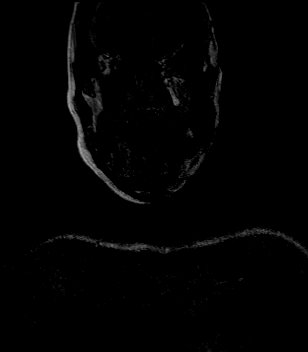
[im 18/104]
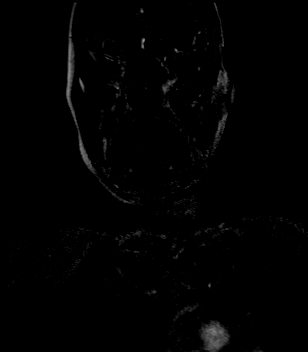
[im 35/104]
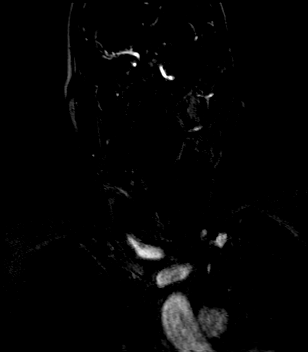
[im 52/104]
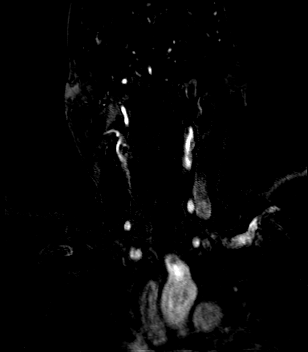
[im 69/104]
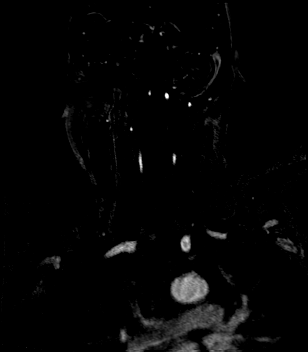
[im 86/104]
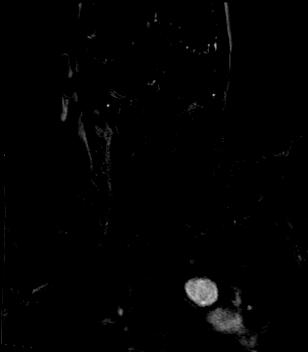
[im 104/104]
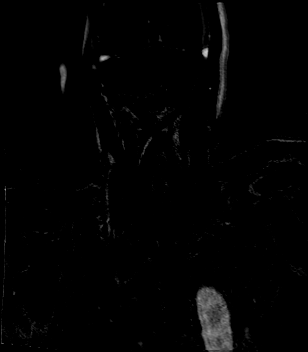

[Series 6: angio_fl3d_cor_post_ttc=3.0s · coronal · 0.9mm · 0.85mm/px · 7 of 104 slices shown (2 of 2)]
[im 1/104]
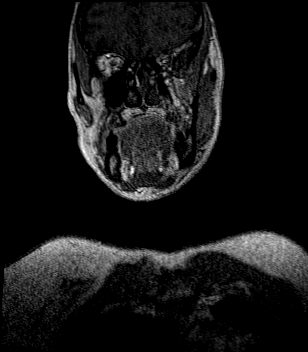
[im 18/104]
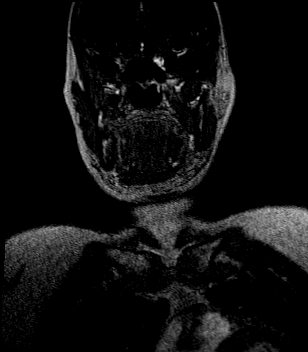
[im 35/104]
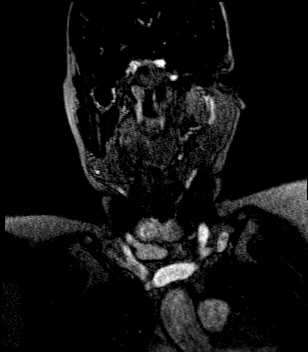
[im 52/104]
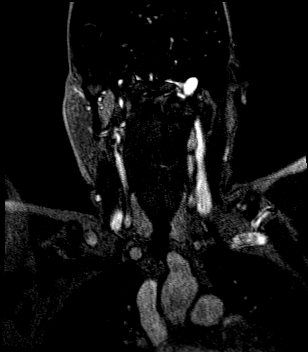
[im 69/104]
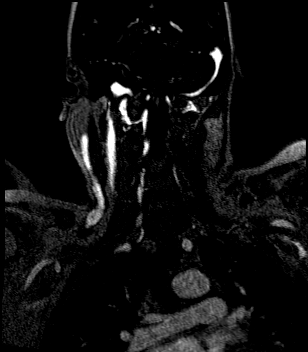
[im 86/104]
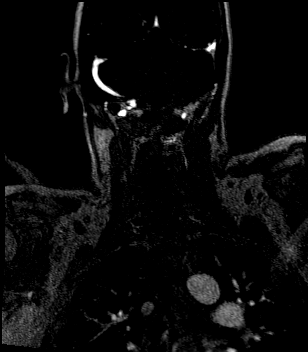
[im 104/104]
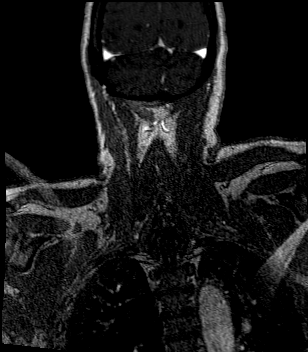

[Series 7: angio_fl3d_cor_post_ttc=3.0s_moco-adv · coronal · 0.9mm · 0.85mm/px · 1 of 104 slices shown (2 of 2)]
[im 1/104]
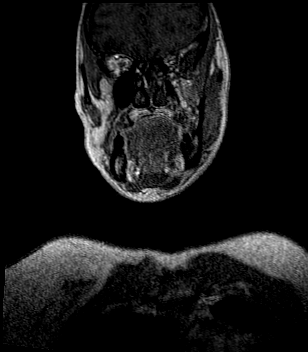

[35 of 48 positions shown; findings below may reference images not displayed]

FINDINGS: MRI HEAD FINDINGS

Brain: Examination moderately degraded by motion artifact.

Cerebral volume within normal limits. Patchy T2/FLAIR hyperintensity
seen involving the periventricular deep white matter of both
cerebral hemispheres, with patchy involvement of the thalami and
pons, most consistent with chronic microvascular ischemic disease,
moderate in nature. Few scatter remote lacunar infarcts present at
the left thalamus and pons.

Multiple scattered foci of restricted diffusion seen involving the
bilateral cerebral and cerebellar hemispheres, consistent with acute
to early subacute ischemic infarcts. The largest infarct involving
the left cerebral hemisphere seen involving the cortical and
subcortical posterior left frontal lobe in measures 1.4 cm (series
7, image 51). Largest infarct involving the right cerebral
hemisphere involves the right occipital cortex and measures 2 cm
(series 5, image 74). This accounts for the hypodensity seen on
prior CT. Few small cerebellar infarcts measure up to 9 mm on the
right. Patchy infarct measuring 1.2 cm seen involving the
central/right paracentral medulla (series 5, image 62). No
associated hemorrhage or mass effect. Minimal associated enhancement
seen about the dominant right occipital infarct.

No mass lesion, midline shift, or significant mass effect. No
hydrocephalus or extra-axial fluid collection. Pituitary gland
suprasellar region within normal limits. Midline structures intact.
No other abnormal enhancement.

Vascular: Major intracranial vascular flow voids are maintained.

Skull and upper cervical spine: Craniocervical junction within
normal limits. Bone marrow signal intensity normal. No scalp soft
tissue abnormality.

Sinuses/Orbits: Globes and orbital soft tissues demonstrate no acute
finding. Mild scattered mucosal thickening noted within the
ethmoidal air cells. Paranasal sinuses are otherwise clear. Trace
bilateral mastoid effusions noted, of doubtful significance. Inner
ear structures grossly normal.

Other: None.

MRA HEAD FINDINGS

Anterior circulation: Examination degraded by motion artifact.

Visualized distal cervical segments of the internal carotid arteries
are patent with antegrade flow. Petrous segments patent bilaterally.
Atheromatous irregularity throughout the carotid siphons
bilaterally. No significant stenosis on the left. On the right,
there is a probable moderate stenosis involving the proximal
cavernous right ICA, better seen on corresponding MRA neck portion
of this study (series 4, image 30).

A1 segments patent bilaterally. Normal anterior communicating artery
complex. Anterior cerebral arteries grossly patent to their distal
aspects. No visible M1 stenosis or occlusion. Normal MCA
bifurcations. Distal MCA branches well perfused and fairly
symmetric.

Posterior circulation: Left vertebral artery dominant. Irregular
moderate stenosis seen involving the distal left V4 segment just
prior to the vertebrobasilar junction, better seen on MRA neck
portion of this exam (series 2, image 54). Left PICA not visualized.
Right vertebral artery diminutive occludes beyond the takeoff of the
right PICA. Partially visualized proximal right PICA patent. Basilar
patent proximally. Apparent short-segment severe stenosis involving
the mid basilar artery on MIP reconstructions is not seen on
corresponding time-of-flight sequence, favored to be artifactual.
Mild to moderate stenosis noted at the basilar tip (series 2, image
44). Superior cerebellar arteries patent bilaterally. Right PCA
primarily supplied via the basilar. Left PCA supplied via the
basilar as well as a robust left posterior communicating artery.
PCAs perfused to their distal aspects without definite high-grade
stenosis.

No visible aneurysm on this motion degraded exam.

Anatomic variants: Diminutive right vertebral artery largely
terminates in PICA. Prominent left posterior communicating artery.

MRA NECK FINDINGS

Aortic arch: Examination moderately degraded by motion.

Visualized aortic arch normal caliber with normal branch pattern. No
hemodynamically significant stenosis seen about the origin of the
great vessels.

Right carotid system: Right CCA patent from its origin to the
bifurcation without stenosis. Atheromatous irregularity seen about
the right carotid bulb/proximal right ICA with no more than mild 20%
stenosis by NASCET criteria. Right ICA patent distally without
stenosis, evidence for dissection, or occlusion.

Left carotid system: Left CCA patent from its origin to the
bifurcation without stenosis. Atheromatous irregularity with up to
70% short-segment stenosis at the origin of the left ICA (series
[T7], image 3). Left ICA patent distally without additional
stenosis, evidence for dissection or occlusion.

Vertebral arteries: Both vertebral arteries arise from subclavian
arteries. Vertebral arteries are patent without hemodynamically
significant stenosis. No evidence for dissection or occlusion.

Other: None
IMPRESSION: MRI HEAD:

1. Multiple scattered acute to early subacute ischemic
nonhemorrhagic infarcts involving the bilateral cerebral and
cerebellar hemispheres as above, with largest area of infarction
measuring up to 2 cm at the right occipital cortex. Given the
various vascular distributions involved, a central thromboembolic
etiology is suspected.
2. Underlying moderate chronic microvascular ischemic disease with a
few scattered remote lacunar infarcts involving the left thalamus
and pons.

MRA HEAD:

1. Technically limited exam due to motion artifact.
2. Negative intracranial MRA for large vessel occlusion.
Intracranial atherosclerotic disease with associated moderate
stenoses involving the distal left V4 segment, distal basilar
artery, and cavernous right ICA as above.
3. Occlusion of the right V4 segment beyond the takeoff of the right
PICA.

MRA NECK:

1. Technically limited exam due to motion artifact.
2. Approximate 70% atheromatous stenosis at the origin of the left
ICA.
3. Otherwise negative MRA of the neck. No other hemodynamically
significant or correctable stenosis identified.

## 2020-09-28 IMAGING — MR MR MRA HEAD W/O CM
1 series · 15 of 48 positions shown · IV contrast (gadavist)
Comparison: Prior CT from [DATE].

CLINICAL DATA: Initial evaluation for neuro deficit, stroke
suspected, left-sided weakness.

EXAM:
MRI HEAD WITHOUT AND WITH CONTRAST
MRA HEAD WITHOUT CONTRAST
MRA NECK WITHOUT AND WITH CONTRAST
TECHNIQUE: Multiplanar, multi-echo pulse sequences of the brain and surrounding
structures were acquired without intravenous contrast. Angiographic
images of the Circle of Willis were acquired using MRA technique
without intravenous contrast. Angiographic images of the neck were
acquired using MRA technique without and with intravenous contrast.
Carotid stenosis measurements (when applicable) are obtained
utilizing NASCET criteria, using the distal internal carotid
diameter as the denominator.
CONTRAST:  8mL GADAVIST GADOBUTROL 1 MMOL/ML IV SOLN

[Series 1: 3d cow · axial · 0.5mm · 0.41mm/px · z∈[-103,-25]mm · 15 of 172 slices shown]
[im 1/172]
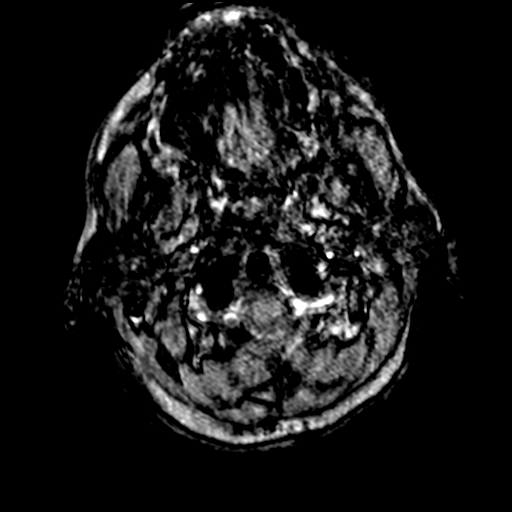
[im 4/172]
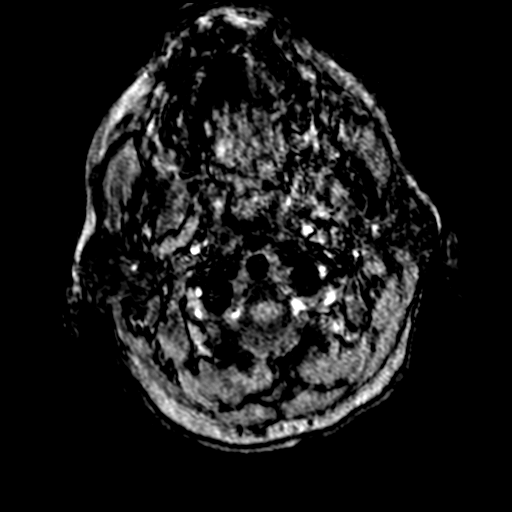
[im 8/172]
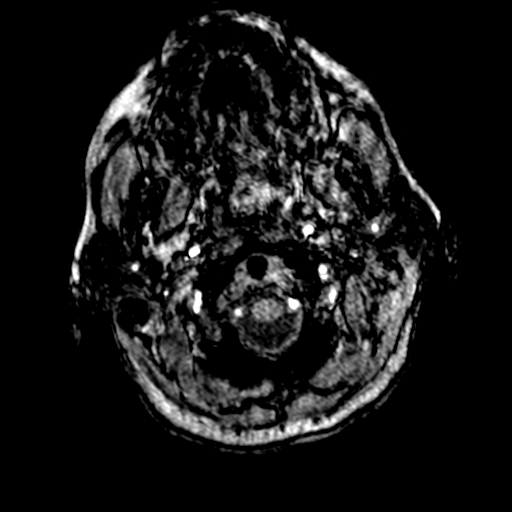
[im 11/172]
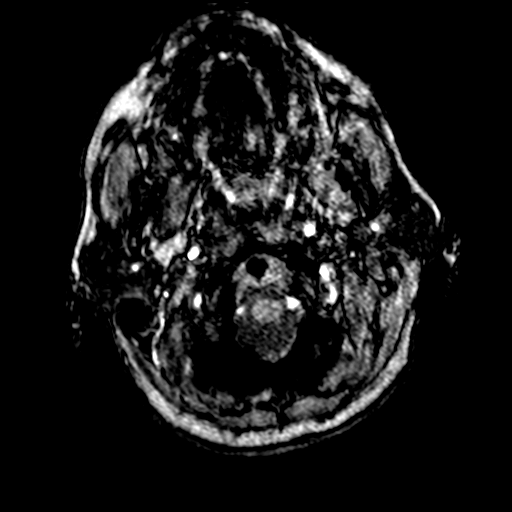
[im 15/172]
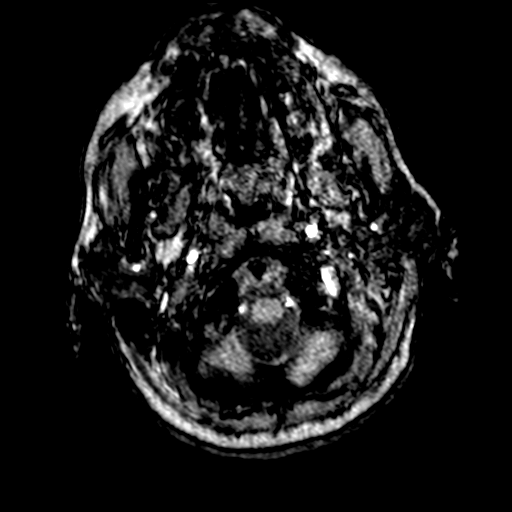
[im 30/172]
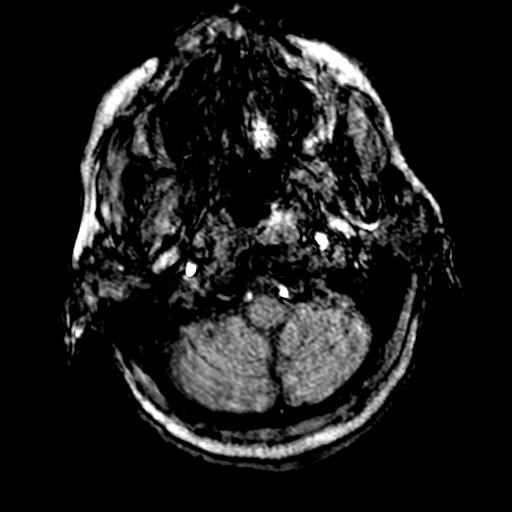
[im 33/172]
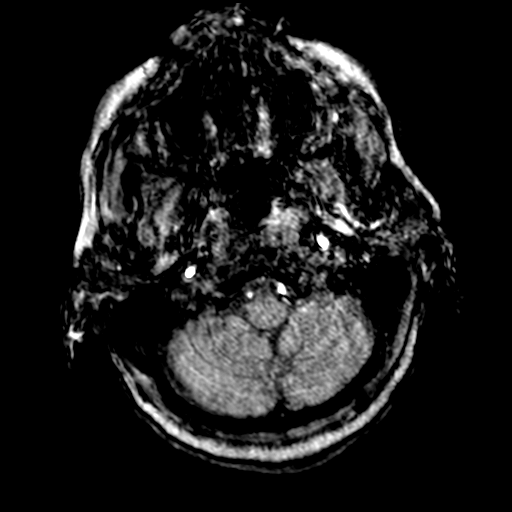
[im 55/172]
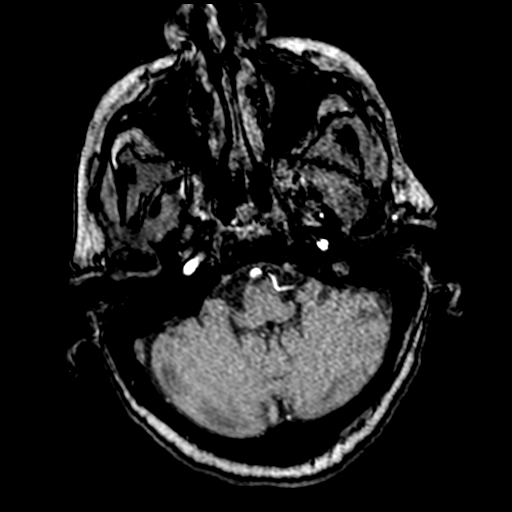
[im 77/172]
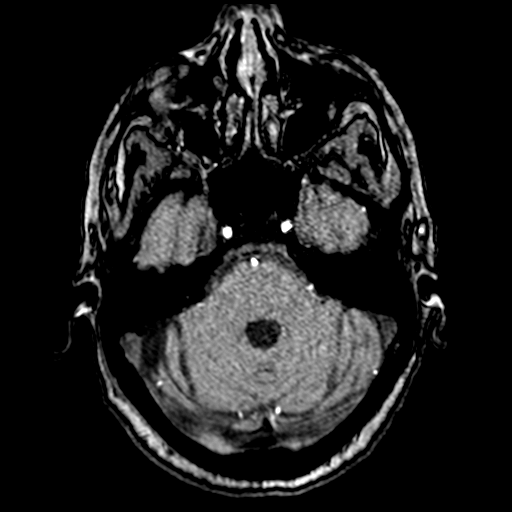
[im 88/172]
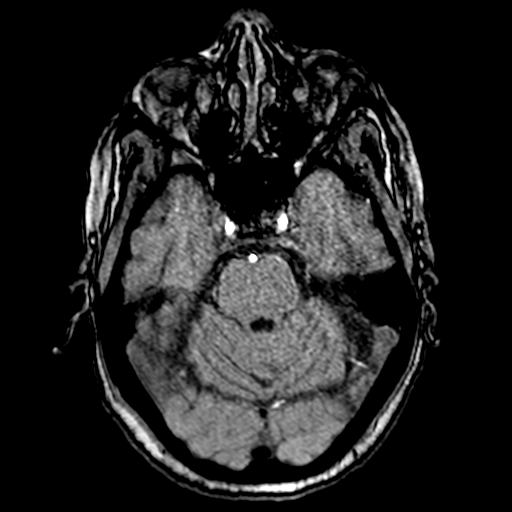
[im 99/172]
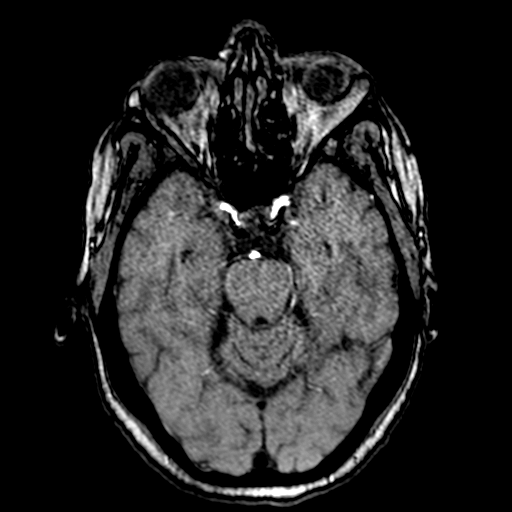
[im 121/172]
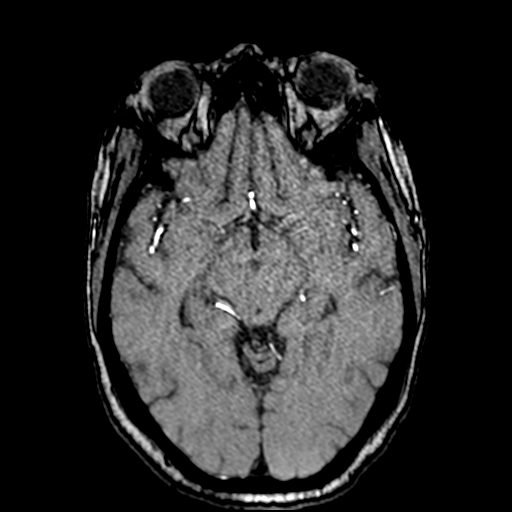
[im 142/172]
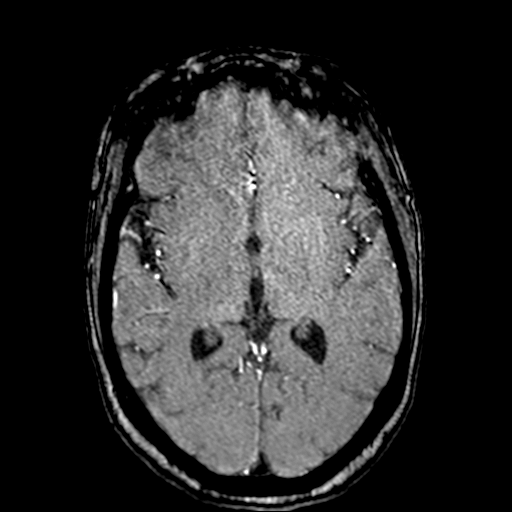
[im 146/172]
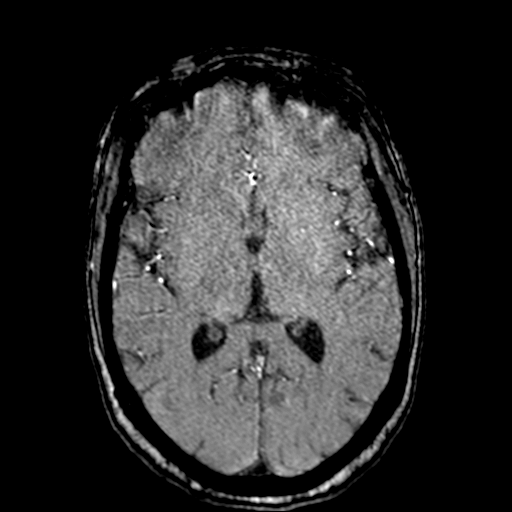
[im 164/172]
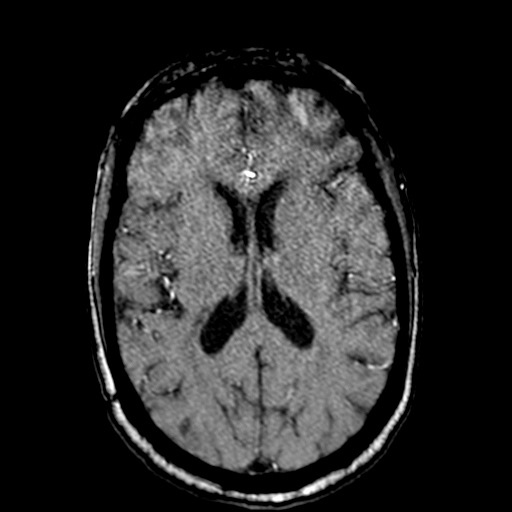

[15 of 48 positions shown; findings below may reference images not displayed]

FINDINGS: MRI HEAD FINDINGS

Brain: Examination moderately degraded by motion artifact.

Cerebral volume within normal limits. Patchy T2/FLAIR hyperintensity
seen involving the periventricular deep white matter of both
cerebral hemispheres, with patchy involvement of the thalami and
pons, most consistent with chronic microvascular ischemic disease,
moderate in nature. Few scatter remote lacunar infarcts present at
the left thalamus and pons.

Multiple scattered foci of restricted diffusion seen involving the
bilateral cerebral and cerebellar hemispheres, consistent with acute
to early subacute ischemic infarcts. The largest infarct involving
the left cerebral hemisphere seen involving the cortical and
subcortical posterior left frontal lobe in measures 1.4 cm (series
7, image 51). Largest infarct involving the right cerebral
hemisphere involves the right occipital cortex and measures 2 cm
(series 5, image 74). This accounts for the hypodensity seen on
prior CT. Few small cerebellar infarcts measure up to 9 mm on the
right. Patchy infarct measuring 1.2 cm seen involving the
central/right paracentral medulla (series 5, image 62). No
associated hemorrhage or mass effect. Minimal associated enhancement
seen about the dominant right occipital infarct.

No mass lesion, midline shift, or significant mass effect. No
hydrocephalus or extra-axial fluid collection. Pituitary gland
suprasellar region within normal limits. Midline structures intact.
No other abnormal enhancement.

Vascular: Major intracranial vascular flow voids are maintained.

Skull and upper cervical spine: Craniocervical junction within
normal limits. Bone marrow signal intensity normal. No scalp soft
tissue abnormality.

Sinuses/Orbits: Globes and orbital soft tissues demonstrate no acute
finding. Mild scattered mucosal thickening noted within the
ethmoidal air cells. Paranasal sinuses are otherwise clear. Trace
bilateral mastoid effusions noted, of doubtful significance. Inner
ear structures grossly normal.

Other: None.

MRA HEAD FINDINGS

Anterior circulation: Examination degraded by motion artifact.

Visualized distal cervical segments of the internal carotid arteries
are patent with antegrade flow. Petrous segments patent bilaterally.
Atheromatous irregularity throughout the carotid siphons
bilaterally. No significant stenosis on the left. On the right,
there is a probable moderate stenosis involving the proximal
cavernous right ICA, better seen on corresponding MRA neck portion
of this study (series 4, image 30).

A1 segments patent bilaterally. Normal anterior communicating artery
complex. Anterior cerebral arteries grossly patent to their distal
aspects. No visible M1 stenosis or occlusion. Normal MCA
bifurcations. Distal MCA branches well perfused and fairly
symmetric.

Posterior circulation: Left vertebral artery dominant. Irregular
moderate stenosis seen involving the distal left V4 segment just
prior to the vertebrobasilar junction, better seen on MRA neck
portion of this exam (series 2, image 54). Left PICA not visualized.
Right vertebral artery diminutive occludes beyond the takeoff of the
right PICA. Partially visualized proximal right PICA patent. Basilar
patent proximally. Apparent short-segment severe stenosis involving
the mid basilar artery on MIP reconstructions is not seen on
corresponding time-of-flight sequence, favored to be artifactual.
Mild to moderate stenosis noted at the basilar tip (series 2, image
44). Superior cerebellar arteries patent bilaterally. Right PCA
primarily supplied via the basilar. Left PCA supplied via the
basilar as well as a robust left posterior communicating artery.
PCAs perfused to their distal aspects without definite high-grade
stenosis.

No visible aneurysm on this motion degraded exam.

Anatomic variants: Diminutive right vertebral artery largely
terminates in PICA. Prominent left posterior communicating artery.

MRA NECK FINDINGS

Aortic arch: Examination moderately degraded by motion.

Visualized aortic arch normal caliber with normal branch pattern. No
hemodynamically significant stenosis seen about the origin of the
great vessels.

Right carotid system: Right CCA patent from its origin to the
bifurcation without stenosis. Atheromatous irregularity seen about
the right carotid bulb/proximal right ICA with no more than mild 20%
stenosis by NASCET criteria. Right ICA patent distally without
stenosis, evidence for dissection, or occlusion.

Left carotid system: Left CCA patent from its origin to the
bifurcation without stenosis. Atheromatous irregularity with up to
70% short-segment stenosis at the origin of the left ICA (series
[T7], image 3). Left ICA patent distally without additional
stenosis, evidence for dissection or occlusion.

Vertebral arteries: Both vertebral arteries arise from subclavian
arteries. Vertebral arteries are patent without hemodynamically
significant stenosis. No evidence for dissection or occlusion.

Other: None
IMPRESSION: MRI HEAD:

1. Multiple scattered acute to early subacute ischemic
nonhemorrhagic infarcts involving the bilateral cerebral and
cerebellar hemispheres as above, with largest area of infarction
measuring up to 2 cm at the right occipital cortex. Given the
various vascular distributions involved, a central thromboembolic
etiology is suspected.
2. Underlying moderate chronic microvascular ischemic disease with a
few scattered remote lacunar infarcts involving the left thalamus
and pons.

MRA HEAD:

1. Technically limited exam due to motion artifact.
2. Negative intracranial MRA for large vessel occlusion.
Intracranial atherosclerotic disease with associated moderate
stenoses involving the distal left V4 segment, distal basilar
artery, and cavernous right ICA as above.
3. Occlusion of the right V4 segment beyond the takeoff of the right
PICA.

MRA NECK:

1. Technically limited exam due to motion artifact.
2. Approximate 70% atheromatous stenosis at the origin of the left
ICA.
3. Otherwise negative MRA of the neck. No other hemodynamically
significant or correctable stenosis identified.

## 2020-09-28 IMAGING — MR MR HEAD WO/W CM
13 of 17 series · 34 of 48 positions shown · IV contrast (gadavist)
Comparison: Prior CT from [DATE].

CLINICAL DATA: Initial evaluation for neuro deficit, stroke
suspected, left-sided weakness.

EXAM:
MRI HEAD WITHOUT AND WITH CONTRAST
MRA HEAD WITHOUT CONTRAST
MRA NECK WITHOUT AND WITH CONTRAST
TECHNIQUE: Multiplanar, multi-echo pulse sequences of the brain and surrounding
structures were acquired without intravenous contrast. Angiographic
images of the Circle of Willis were acquired using MRA technique
without intravenous contrast. Angiographic images of the neck were
acquired using MRA technique without and with intravenous contrast.
Carotid stenosis measurements (when applicable) are obtained
utilizing NASCET criteria, using the distal internal carotid
diameter as the denominator.
CONTRAST:  8mL GADAVIST GADOBUTROL 1 MMOL/ML IV SOLN

[Series 5: DWI · axial · 3.0mm · 0.88mm/px · z∈[-103,+43]mm · 8 of 102 slices shown (1 of 4)]
[im 1/102]
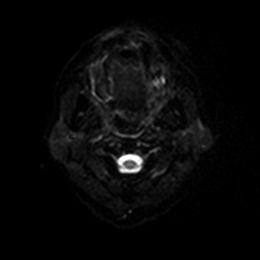
[im 15/102]
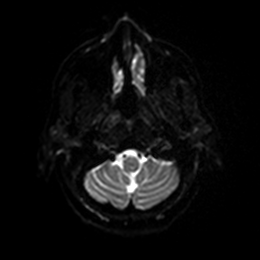
[im 29/102]
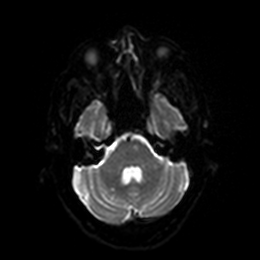
[im 44/102]
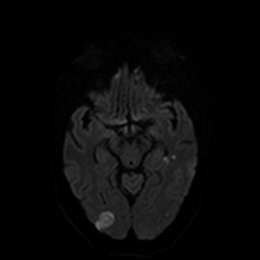
[im 58/102]
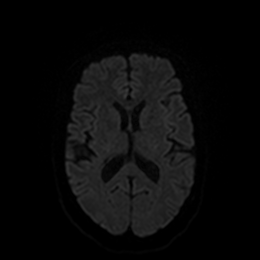
[im 73/102]
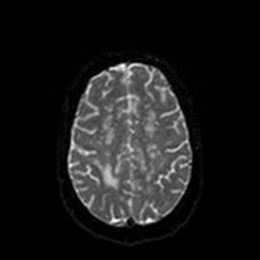
[im 87/102]
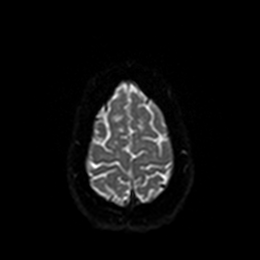
[im 102/102]
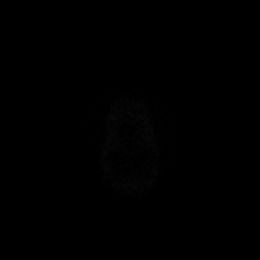

[Series 6: DWI · axial · 3.0mm · 0.88mm/px · z∈[-103,+43]mm · 3 of 51 slices shown (2 of 4)]
[im 1/51]
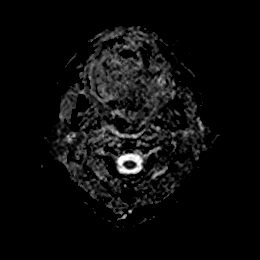
[im 26/51]
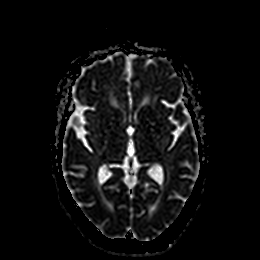
[im 51/51]
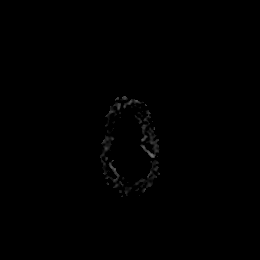

[Series 7: DWI · coronal · 4.0mm · 0.88mm/px · 5 of 72 slices shown (3 of 4)]
[im 1/72]
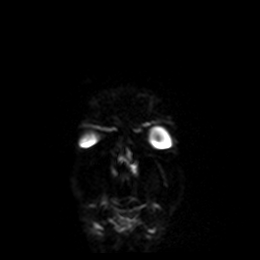
[im 18/72]
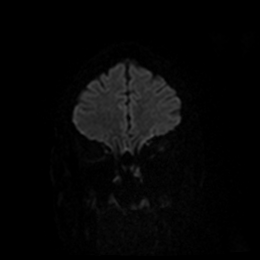
[im 36/72]
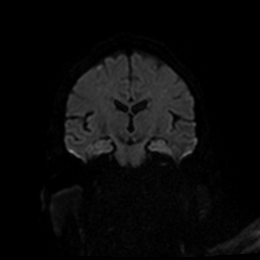
[im 54/72]
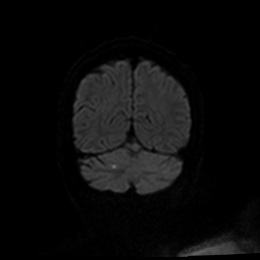
[im 72/72]
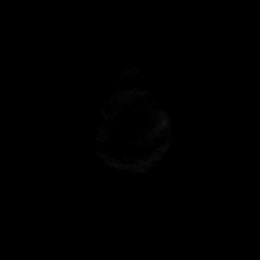

[Series 8: DWI · coronal · 4.0mm · 0.88mm/px · 2 of 36 slices shown (4 of 4)]
[im 1/36]
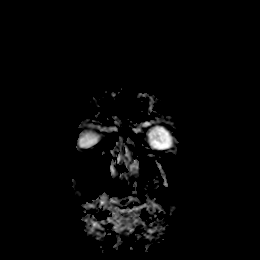
[im 36/36]
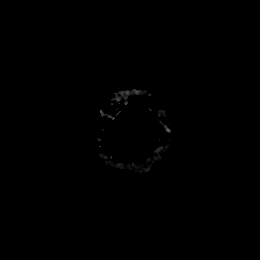

[Series 9: T1 · sagittal · 5.0mm · 0.75mm/px · 1 of 23 slices shown]
[im 1/23]
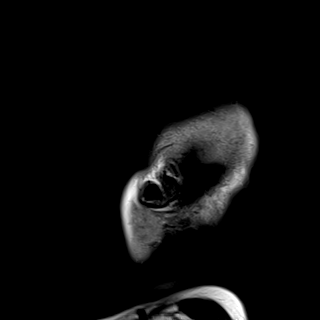

[Series 10: T2 · axial · 5.0mm · 0.72mm/px · z∈[-105,+45]mm · 2 of 27 slices shown (1 of 2)]
[im 1/27]
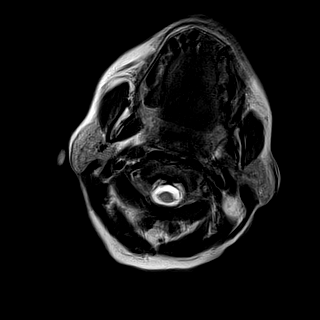
[im 27/27]
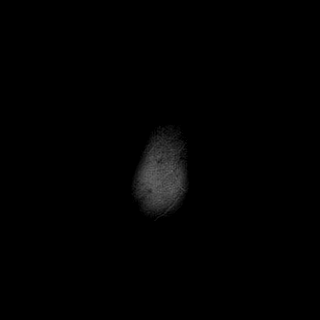

[Series 11: FLAIR · axial · 5.0mm · 0.45mm/px · z∈[-110,+41]mm · 2 of 27 slices shown (1 of 2)]
[im 1/27]
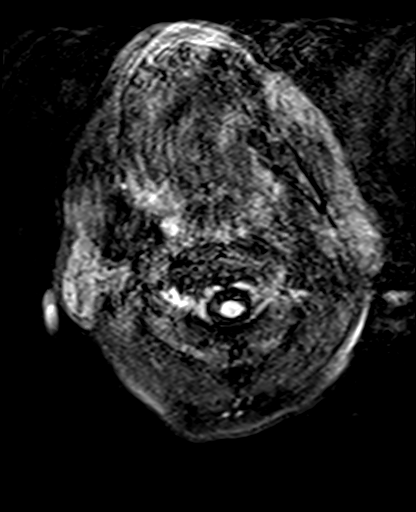
[im 27/27]
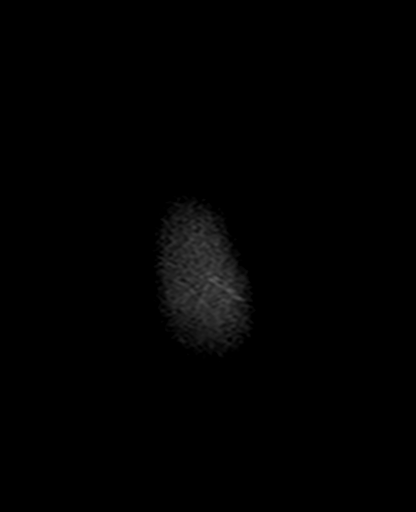

[Series 12: FLAIR · axial · 5.0mm · 0.45mm/px · z∈[-110,+41]mm · 2 of 27 slices shown (2 of 2)]
[im 1/27]
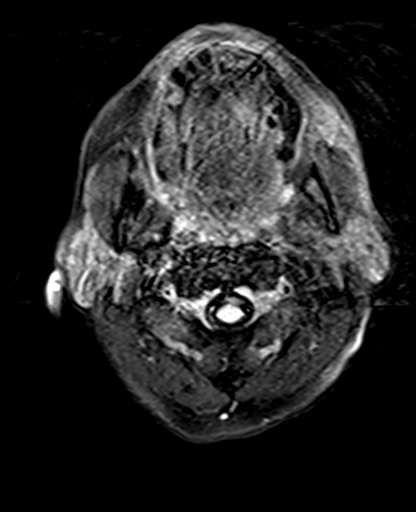
[im 27/27]
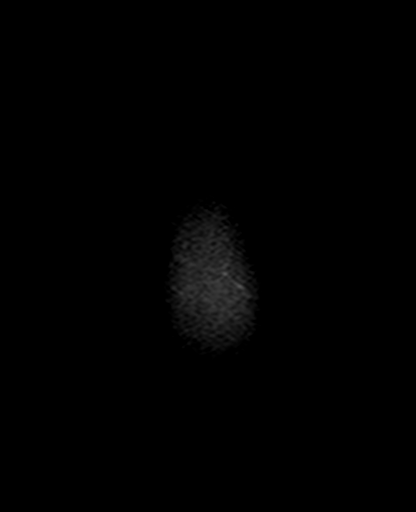

[Series 13: mag_images · axial · 3.0mm · 0.90mm/px · z∈[-108,+40]mm · 3 of 52 slices shown]
[im 1/52]
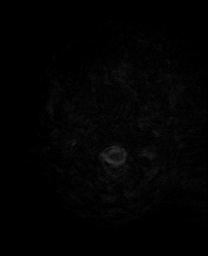
[im 26/52]
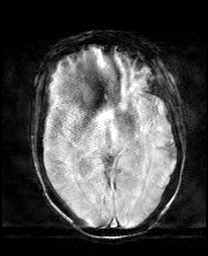
[im 52/52]
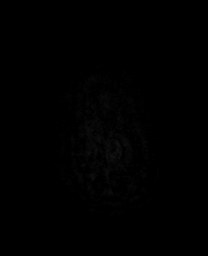

[Series 14: pha_images · axial · 3.0mm · 0.90mm/px · 1 of 52 slices shown]
[im 1/52]
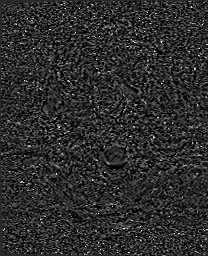

[Series 17: T2 · axial · 5.0mm · 0.72mm/px · z∈[-105,+45]mm · 2 of 27 slices shown (2 of 2)]
[im 1/27]
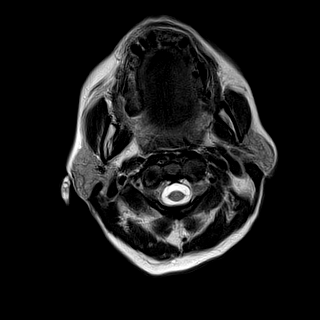
[im 27/27]
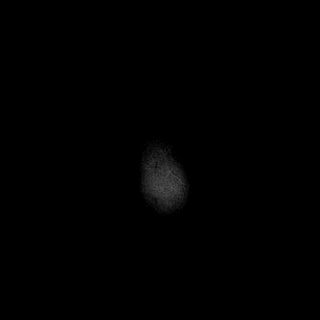

[Series 31: T1 post-contrast · coronal · 5.0mm · 0.34mm/px · 2 of 30 slices shown (1 of 2)]
[im 1/30]
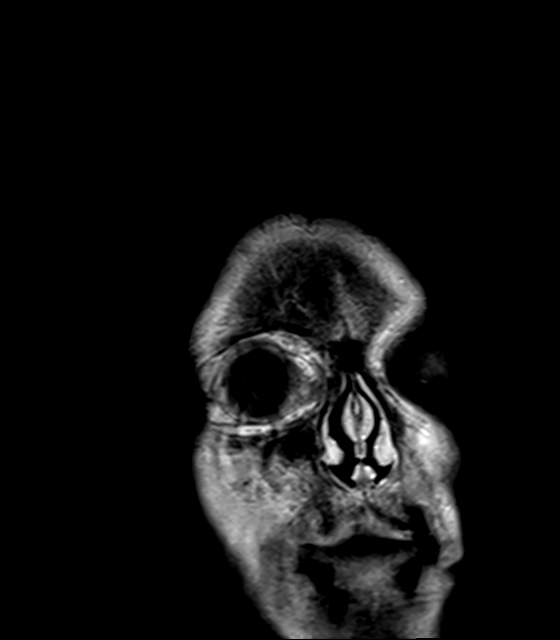
[im 30/30]
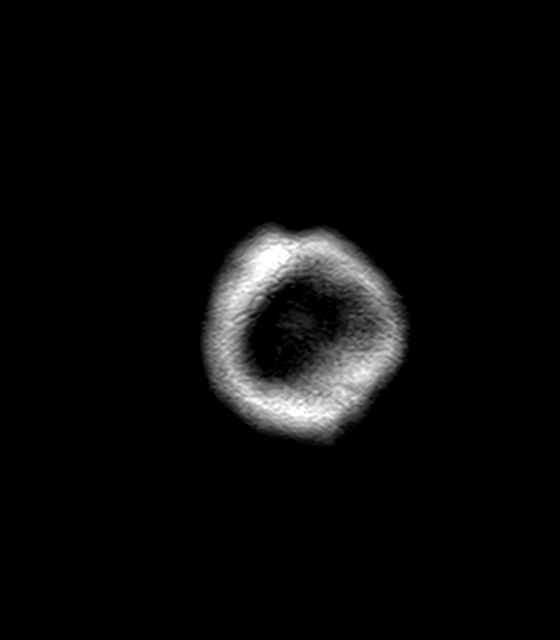

[Series 32: T1 post-contrast · sagittal · 5.0mm · 0.72mm/px · 1 of 23 slices shown (2 of 2)]
[im 1/23]
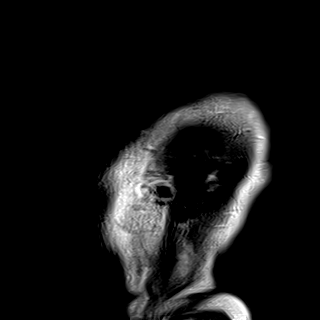

[34 of 48 positions shown; findings below may reference images not displayed]

FINDINGS: MRI HEAD FINDINGS

Brain: Examination moderately degraded by motion artifact.

Cerebral volume within normal limits. Patchy T2/FLAIR hyperintensity
seen involving the periventricular deep white matter of both
cerebral hemispheres, with patchy involvement of the thalami and
pons, most consistent with chronic microvascular ischemic disease,
moderate in nature. Few scatter remote lacunar infarcts present at
the left thalamus and pons.

Multiple scattered foci of restricted diffusion seen involving the
bilateral cerebral and cerebellar hemispheres, consistent with acute
to early subacute ischemic infarcts. The largest infarct involving
the left cerebral hemisphere seen involving the cortical and
subcortical posterior left frontal lobe in measures 1.4 cm (series
7, image 51). Largest infarct involving the right cerebral
hemisphere involves the right occipital cortex and measures 2 cm
(series 5, image 74). This accounts for the hypodensity seen on
prior CT. Few small cerebellar infarcts measure up to 9 mm on the
right. Patchy infarct measuring 1.2 cm seen involving the
central/right paracentral medulla (series 5, image 62). No
associated hemorrhage or mass effect. Minimal associated enhancement
seen about the dominant right occipital infarct.

No mass lesion, midline shift, or significant mass effect. No
hydrocephalus or extra-axial fluid collection. Pituitary gland
suprasellar region within normal limits. Midline structures intact.
No other abnormal enhancement.

Vascular: Major intracranial vascular flow voids are maintained.

Skull and upper cervical spine: Craniocervical junction within
normal limits. Bone marrow signal intensity normal. No scalp soft
tissue abnormality.

Sinuses/Orbits: Globes and orbital soft tissues demonstrate no acute
finding. Mild scattered mucosal thickening noted within the
ethmoidal air cells. Paranasal sinuses are otherwise clear. Trace
bilateral mastoid effusions noted, of doubtful significance. Inner
ear structures grossly normal.

Other: None.

MRA HEAD FINDINGS

Anterior circulation: Examination degraded by motion artifact.

Visualized distal cervical segments of the internal carotid arteries
are patent with antegrade flow. Petrous segments patent bilaterally.
Atheromatous irregularity throughout the carotid siphons
bilaterally. No significant stenosis on the left. On the right,
there is a probable moderate stenosis involving the proximal
cavernous right ICA, better seen on corresponding MRA neck portion
of this study (series 4, image 30).

A1 segments patent bilaterally. Normal anterior communicating artery
complex. Anterior cerebral arteries grossly patent to their distal
aspects. No visible M1 stenosis or occlusion. Normal MCA
bifurcations. Distal MCA branches well perfused and fairly
symmetric.

Posterior circulation: Left vertebral artery dominant. Irregular
moderate stenosis seen involving the distal left V4 segment just
prior to the vertebrobasilar junction, better seen on MRA neck
portion of this exam (series 2, image 54). Left PICA not visualized.
Right vertebral artery diminutive occludes beyond the takeoff of the
right PICA. Partially visualized proximal right PICA patent. Basilar
patent proximally. Apparent short-segment severe stenosis involving
the mid basilar artery on MIP reconstructions is not seen on
corresponding time-of-flight sequence, favored to be artifactual.
Mild to moderate stenosis noted at the basilar tip (series 2, image
44). Superior cerebellar arteries patent bilaterally. Right PCA
primarily supplied via the basilar. Left PCA supplied via the
basilar as well as a robust left posterior communicating artery.
PCAs perfused to their distal aspects without definite high-grade
stenosis.

No visible aneurysm on this motion degraded exam.

Anatomic variants: Diminutive right vertebral artery largely
terminates in PICA. Prominent left posterior communicating artery.

MRA NECK FINDINGS

Aortic arch: Examination moderately degraded by motion.

Visualized aortic arch normal caliber with normal branch pattern. No
hemodynamically significant stenosis seen about the origin of the
great vessels.

Right carotid system: Right CCA patent from its origin to the
bifurcation without stenosis. Atheromatous irregularity seen about
the right carotid bulb/proximal right ICA with no more than mild 20%
stenosis by NASCET criteria. Right ICA patent distally without
stenosis, evidence for dissection, or occlusion.

Left carotid system: Left CCA patent from its origin to the
bifurcation without stenosis. Atheromatous irregularity with up to
70% short-segment stenosis at the origin of the left ICA (series
[T7], image 3). Left ICA patent distally without additional
stenosis, evidence for dissection or occlusion.

Vertebral arteries: Both vertebral arteries arise from subclavian
arteries. Vertebral arteries are patent without hemodynamically
significant stenosis. No evidence for dissection or occlusion.

Other: None
IMPRESSION: MRI HEAD:

1. Multiple scattered acute to early subacute ischemic
nonhemorrhagic infarcts involving the bilateral cerebral and
cerebellar hemispheres as above, with largest area of infarction
measuring up to 2 cm at the right occipital cortex. Given the
various vascular distributions involved, a central thromboembolic
etiology is suspected.
2. Underlying moderate chronic microvascular ischemic disease with a
few scattered remote lacunar infarcts involving the left thalamus
and pons.

MRA HEAD:

1. Technically limited exam due to motion artifact.
2. Negative intracranial MRA for large vessel occlusion.
Intracranial atherosclerotic disease with associated moderate
stenoses involving the distal left V4 segment, distal basilar
artery, and cavernous right ICA as above.
3. Occlusion of the right V4 segment beyond the takeoff of the right
PICA.

MRA NECK:

1. Technically limited exam due to motion artifact.
2. Approximate 70% atheromatous stenosis at the origin of the left
ICA.
3. Otherwise negative MRA of the neck. No other hemodynamically
significant or correctable stenosis identified.

## 2020-09-28 IMAGING — MR MR MRA NECK WO/W CM
1 series · 31 of 48 positions shown · IV contrast (gadavist)
Comparison: Prior CT from [DATE].

CLINICAL DATA: Initial evaluation for neuro deficit, stroke
suspected, left-sided weakness.

EXAM:
MRI HEAD WITHOUT AND WITH CONTRAST
MRA HEAD WITHOUT CONTRAST
MRA NECK WITHOUT AND WITH CONTRAST
TECHNIQUE: Multiplanar, multi-echo pulse sequences of the brain and surrounding
structures were acquired without intravenous contrast. Angiographic
images of the Circle of Willis were acquired using MRA technique
without intravenous contrast. Angiographic images of the neck were
acquired using MRA technique without and with intravenous contrast.
Carotid stenosis measurements (when applicable) are obtained
utilizing NASCET criteria, using the distal internal carotid
diameter as the denominator.
CONTRAST:  8mL GADAVIST GADOBUTROL 1 MMOL/ML IV SOLN

[Series 25: [id number]_iso · axial · 0.6mm · 0.52mm/px · z∈[-195,-124]mm · 31 of 133 slices shown]
[im 1/133]
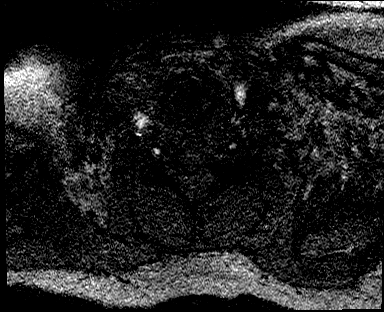
[im 3/133]
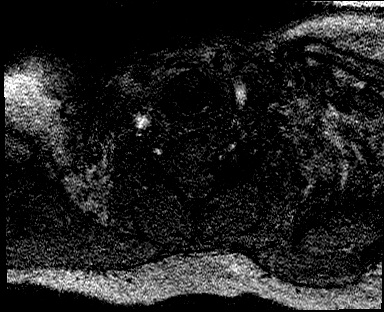
[im 6/133]
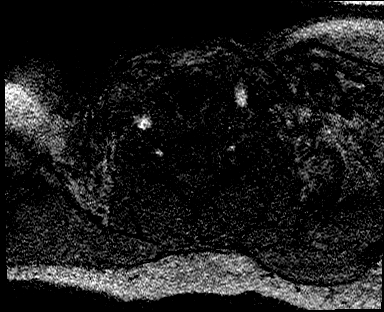
[im 9/133]
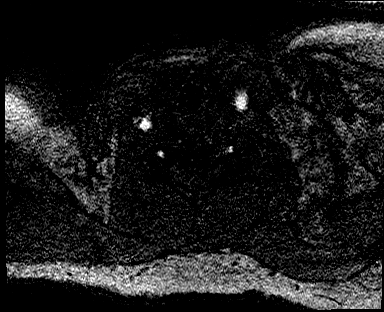
[im 12/133]
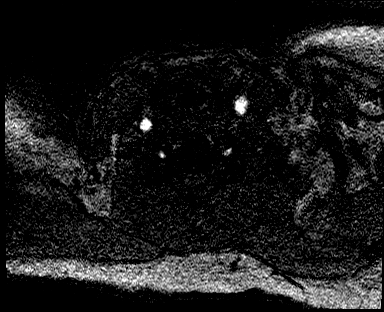
[im 15/133]
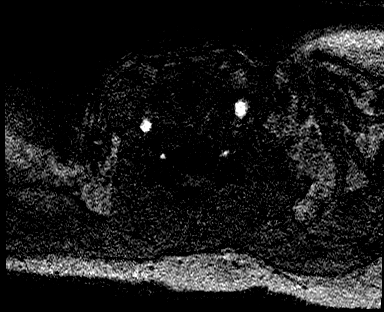
[im 17/133]
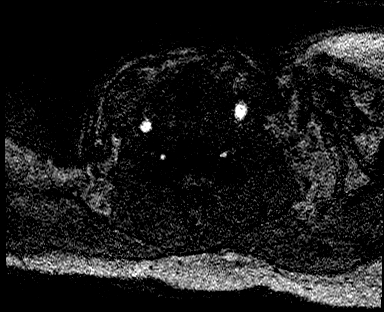
[im 20/133]
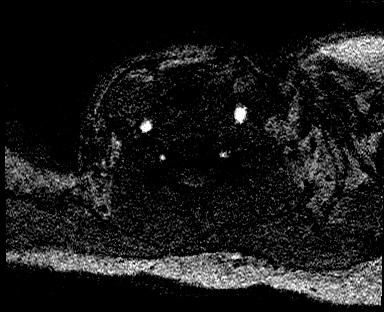
[im 23/133]
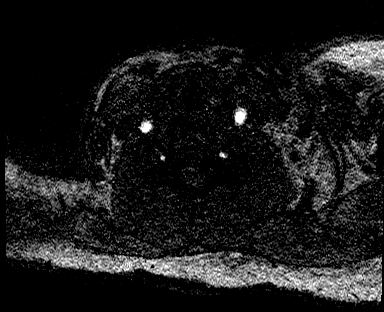
[im 26/133]
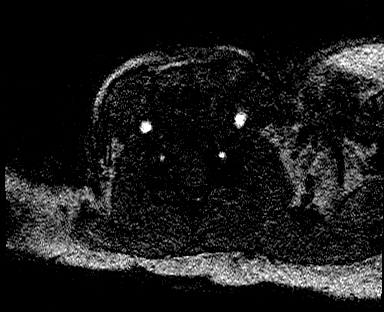
[im 29/133]
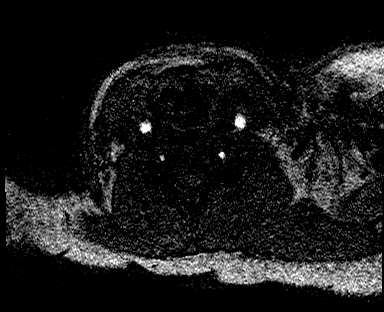
[im 31/133]
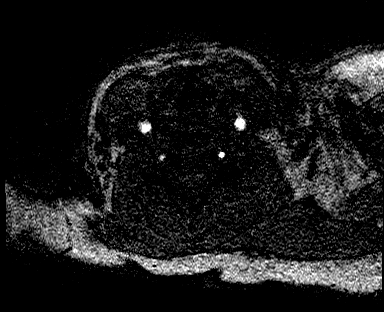
[im 34/133]
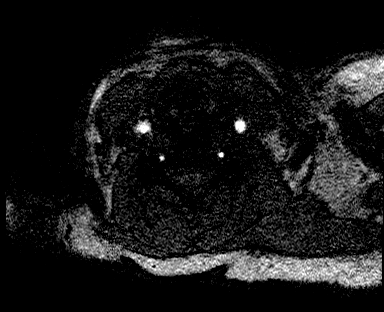
[im 37/133]
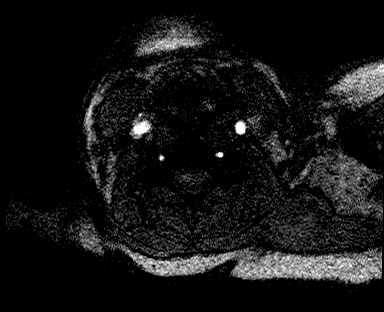
[im 40/133]
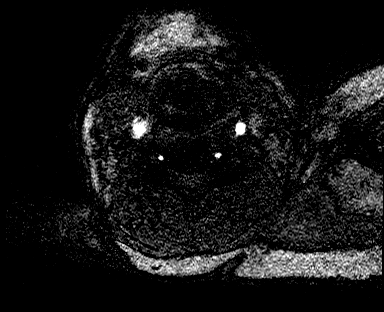
[im 43/133]
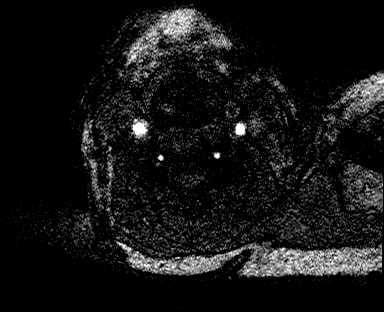
[im 45/133]
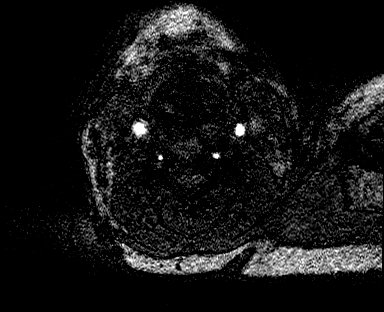
[im 48/133]
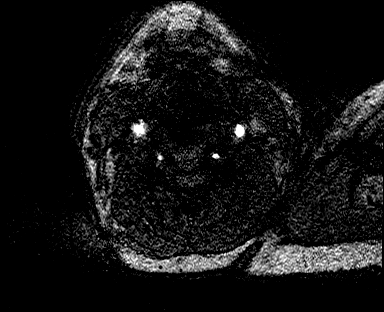
[im 51/133]
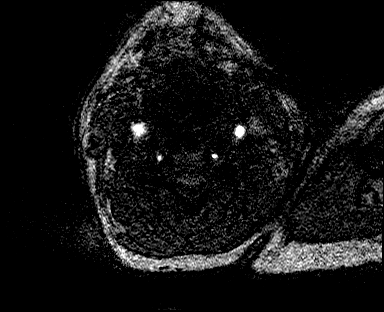
[im 54/133]
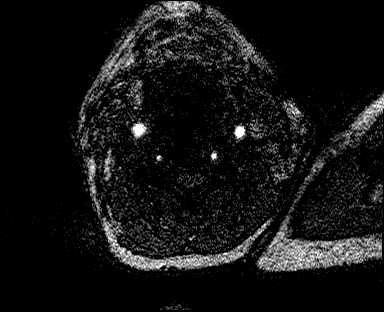
[im 57/133]
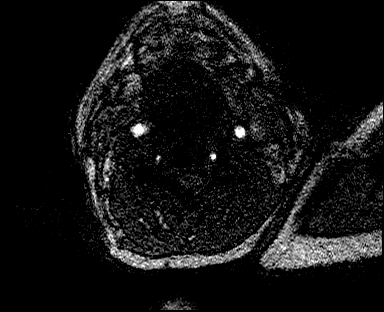
[im 59/133]
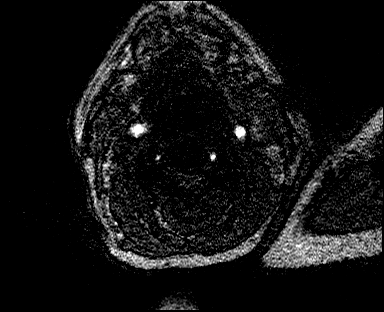
[im 62/133]
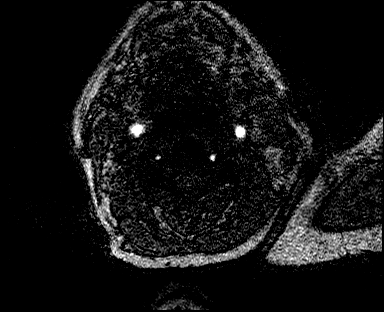
[im 65/133]
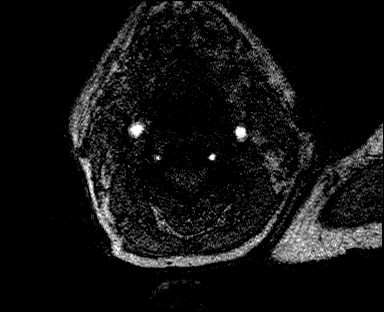
[im 68/133]
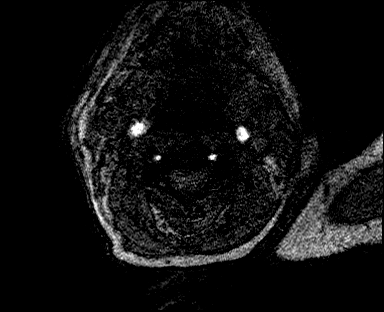
[im 71/133]
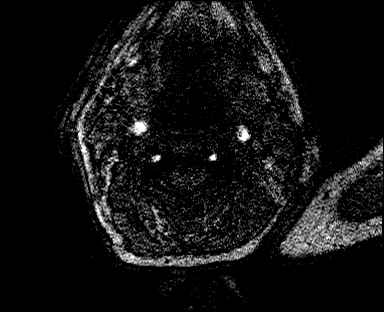
[im 76/133]
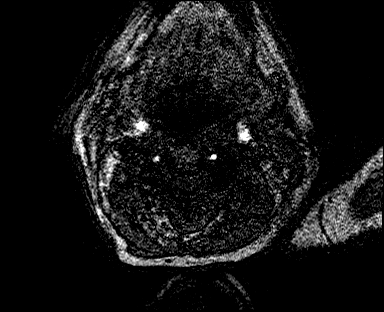
[im 93/133]
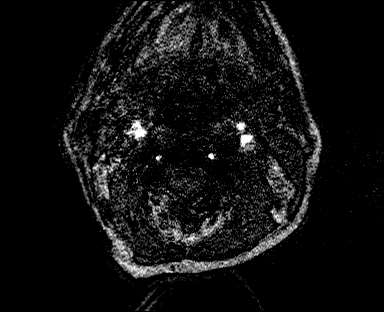
[im 110/133]
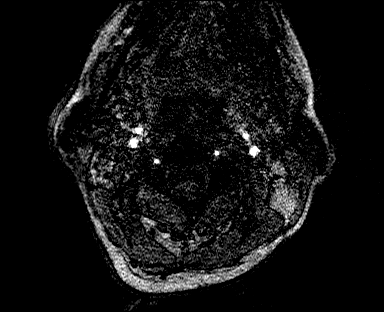
[im 113/133]
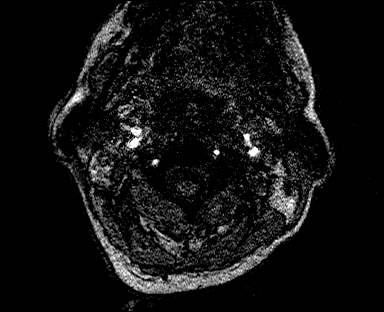
[im 127/133]
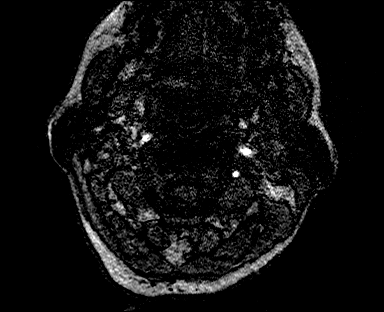

[31 of 48 positions shown; findings below may reference images not displayed]

FINDINGS: MRI HEAD FINDINGS

Brain: Examination moderately degraded by motion artifact.

Cerebral volume within normal limits. Patchy T2/FLAIR hyperintensity
seen involving the periventricular deep white matter of both
cerebral hemispheres, with patchy involvement of the thalami and
pons, most consistent with chronic microvascular ischemic disease,
moderate in nature. Few scatter remote lacunar infarcts present at
the left thalamus and pons.

Multiple scattered foci of restricted diffusion seen involving the
bilateral cerebral and cerebellar hemispheres, consistent with acute
to early subacute ischemic infarcts. The largest infarct involving
the left cerebral hemisphere seen involving the cortical and
subcortical posterior left frontal lobe in measures 1.4 cm (series
7, image 51). Largest infarct involving the right cerebral
hemisphere involves the right occipital cortex and measures 2 cm
(series 5, image 74). This accounts for the hypodensity seen on
prior CT. Few small cerebellar infarcts measure up to 9 mm on the
right. Patchy infarct measuring 1.2 cm seen involving the
central/right paracentral medulla (series 5, image 62). No
associated hemorrhage or mass effect. Minimal associated enhancement
seen about the dominant right occipital infarct.

No mass lesion, midline shift, or significant mass effect. No
hydrocephalus or extra-axial fluid collection. Pituitary gland
suprasellar region within normal limits. Midline structures intact.
No other abnormal enhancement.

Vascular: Major intracranial vascular flow voids are maintained.

Skull and upper cervical spine: Craniocervical junction within
normal limits. Bone marrow signal intensity normal. No scalp soft
tissue abnormality.

Sinuses/Orbits: Globes and orbital soft tissues demonstrate no acute
finding. Mild scattered mucosal thickening noted within the
ethmoidal air cells. Paranasal sinuses are otherwise clear. Trace
bilateral mastoid effusions noted, of doubtful significance. Inner
ear structures grossly normal.

Other: None.

MRA HEAD FINDINGS

Anterior circulation: Examination degraded by motion artifact.

Visualized distal cervical segments of the internal carotid arteries
are patent with antegrade flow. Petrous segments patent bilaterally.
Atheromatous irregularity throughout the carotid siphons
bilaterally. No significant stenosis on the left. On the right,
there is a probable moderate stenosis involving the proximal
cavernous right ICA, better seen on corresponding MRA neck portion
of this study (series 4, image 30).

A1 segments patent bilaterally. Normal anterior communicating artery
complex. Anterior cerebral arteries grossly patent to their distal
aspects. No visible M1 stenosis or occlusion. Normal MCA
bifurcations. Distal MCA branches well perfused and fairly
symmetric.

Posterior circulation: Left vertebral artery dominant. Irregular
moderate stenosis seen involving the distal left V4 segment just
prior to the vertebrobasilar junction, better seen on MRA neck
portion of this exam (series 2, image 54). Left PICA not visualized.
Right vertebral artery diminutive occludes beyond the takeoff of the
right PICA. Partially visualized proximal right PICA patent. Basilar
patent proximally. Apparent short-segment severe stenosis involving
the mid basilar artery on MIP reconstructions is not seen on
corresponding time-of-flight sequence, favored to be artifactual.
Mild to moderate stenosis noted at the basilar tip (series 2, image
44). Superior cerebellar arteries patent bilaterally. Right PCA
primarily supplied via the basilar. Left PCA supplied via the
basilar as well as a robust left posterior communicating artery.
PCAs perfused to their distal aspects without definite high-grade
stenosis.

No visible aneurysm on this motion degraded exam.

Anatomic variants: Diminutive right vertebral artery largely
terminates in PICA. Prominent left posterior communicating artery.

MRA NECK FINDINGS

Aortic arch: Examination moderately degraded by motion.

Visualized aortic arch normal caliber with normal branch pattern. No
hemodynamically significant stenosis seen about the origin of the
great vessels.

Right carotid system: Right CCA patent from its origin to the
bifurcation without stenosis. Atheromatous irregularity seen about
the right carotid bulb/proximal right ICA with no more than mild 20%
stenosis by NASCET criteria. Right ICA patent distally without
stenosis, evidence for dissection, or occlusion.

Left carotid system: Left CCA patent from its origin to the
bifurcation without stenosis. Atheromatous irregularity with up to
70% short-segment stenosis at the origin of the left ICA (series
[T7], image 3). Left ICA patent distally without additional
stenosis, evidence for dissection or occlusion.

Vertebral arteries: Both vertebral arteries arise from subclavian
arteries. Vertebral arteries are patent without hemodynamically
significant stenosis. No evidence for dissection or occlusion.

Other: None
IMPRESSION: MRI HEAD:

1. Multiple scattered acute to early subacute ischemic
nonhemorrhagic infarcts involving the bilateral cerebral and
cerebellar hemispheres as above, with largest area of infarction
measuring up to 2 cm at the right occipital cortex. Given the
various vascular distributions involved, a central thromboembolic
etiology is suspected.
2. Underlying moderate chronic microvascular ischemic disease with a
few scattered remote lacunar infarcts involving the left thalamus
and pons.

MRA HEAD:

1. Technically limited exam due to motion artifact.
2. Negative intracranial MRA for large vessel occlusion.
Intracranial atherosclerotic disease with associated moderate
stenoses involving the distal left V4 segment, distal basilar
artery, and cavernous right ICA as above.
3. Occlusion of the right V4 segment beyond the takeoff of the right
PICA.

MRA NECK:

1. Technically limited exam due to motion artifact.
2. Approximate 70% atheromatous stenosis at the origin of the left
ICA.
3. Otherwise negative MRA of the neck. No other hemodynamically
significant or correctable stenosis identified.

## 2020-09-28 MED ORDER — PERFLUTREN LIPID MICROSPHERE
1.0000 mL | INTRAVENOUS | Status: AC | PRN
  Administered 2020-09-28: 5 mL via INTRAVENOUS
  Filled 2020-09-28: qty 10

## 2020-09-28 MED ORDER — GADOBUTROL 1 MMOL/ML IV SOLN
8.0000 mL | Freq: Once | INTRAVENOUS | Status: AC | PRN
  Administered 2020-09-28: 8 mL via INTRAVENOUS

## 2020-09-28 NOTE — Progress Notes (Signed)
Patient stated that she felt like her bladder was full but that she was unable to void. Bladder scan showed 700 mL. In and out cath performed; drained 600 mL clear yellow urine.

## 2020-09-28 NOTE — Progress Notes (Addendum)
Progress Note  Patient Name: Tracey Meyer Date of Encounter: 09/28/2020  [facility] Cardiologist: None   Subjective   Having trouble getting comfortable in bed but otherwise no complaints. She has ongoing left sided weakness. RN assisted with repositioning patient in the bed. No complaints of chest pain. Breathing is stable. She denies palpitations.   Inpatient Medications    Scheduled Meds:   stroke: mapping our early stages of recovery book   Does not apply Once   aspirin EC  81 mg Oral Daily   enoxaparin (LOVENOX) injection  40 mg Subcutaneous Q24H   insulin aspart  0-5 Units Subcutaneous QHS   insulin aspart  0-9 Units Subcutaneous TID WC   rosuvastatin  40 mg Oral Daily   ticagrelor  90 mg Oral BID   Continuous Infusions:  PRN Meds: acetaminophen **OR** acetaminophen (TYLENOL) oral liquid 160 mg/5 mL **OR** acetaminophen   Vital Signs    Vitals:   09/28/20 0415 09/28/20 0600 09/28/20 0730 09/28/20 0830  BP: 111/63 139/78 (!) 144/82 132/75  Pulse: 69 62 70 68  Resp: 20  18 15   Temp:   98.2 F (36.8 C)   TempSrc:   Oral   SpO2: 99% 96% 93% 93%  Weight:      Height:       No intake or output data in the 24 hours ending 09/28/20 0931 Last 3 Weights 09/27/2020  Weight (lbs) 175 lb  Weight (kg) 79.379 kg      Telemetry    Sinus rhythm/sinus bradycardia - Personally Reviewed  ECG    No new tracings - Personally Reviewed  Physical Exam   GEN: Sitting upright in bed in no acute distress.   Neck: No JVD Cardiac: RRR, no murmurs, rubs, or gallops.  Respiratory: Clear to auscultation bilaterally. GI: Soft, nontender, non-distended  MS: No edema; No deformity. Neuro:  Left sided weakness in arm/leg Psych: Normal affect   Labs    High Sensitivity Troponin:   Recent Labs  Lab 09/22/20 1233 09/27/20 1900 09/27/20 2116  TROPONINIHS >24,000* 15,211* 13,694*      Chemistry Recent Labs  Lab 09/23/20 0134 09/24/20 0317 09/27/20 1900  09/27/20 1950  NA 132* 133* 134* 138  K 3.9 3.4* 3.8 3.8  CL 100 104 104 108  CO2 22 21* 18*  --   GLUCOSE 194* 142* 205* 201*  BUN 28* 27* 26* 26*  CREATININE 1.14* 1.09* 1.30* 1.20*  CALCIUM 9.7 9.3 9.7  --   PROT  --  6.8 6.8  --   ALBUMIN  --  2.9* 3.0*  --   AST  --  163* 28  --   ALT  --  55* 26  --   ALKPHOS  --  48 47  --   BILITOT  --  1.1 0.5  --   GFRNONAA 55* 59* 47*  --   ANIONGAP 10 8 12   --      Hematology Recent Labs  Lab 09/23/20 0134 09/24/20 0317 09/27/20 1900 09/27/20 1950  WBC 12.9* 10.3 6.9  --   RBC 4.11 4.20 3.86*  --   HGB 12.7 13.1 12.1 12.2  HCT 38.3 38.5 36.4 36.0  MCV 93.2 91.7 94.3  --   MCH 30.9 31.2 31.3  --   MCHC 33.2 34.0 33.2  --   RDW 13.8 13.3 13.1  --   PLT 249 287 352  --     BNP Recent Labs  Lab 09/27/20 1916  BNP 724.0*     DDimer No results for input(s): DDIMER in the last 168 hours.   Radiology    CT HEAD WO CONTRAST  Result Date: 09/27/2020 CLINICAL DATA:  Left-sided weakness. EXAM: CT HEAD WITHOUT CONTRAST TECHNIQUE: Contiguous axial images were obtained from the base of the skull through the vertex without intravenous contrast. COMPARISON:  None. FINDINGS: Brain: 18 x 16 x 17 mm focus of peripheral hypo attenuation is identified in posterior right parietooccipital region (image 15/series 2). No evidence for acute hemorrhage, hydrocephalus, or abnormal extra-axial fluid collection. Patchy low attenuation in the deep hemispheric and periventricular white matter is nonspecific, but likely reflects chronic microvascular ischemic demyelination. Vascular: No hyperdense vessel or unexpected calcification. Skull: No evidence for fracture. No worrisome lytic or sclerotic lesion. Sinuses/Orbits: The visualized paranasal sinuses and mastoid air cells are clear. Visualized portions of the globes and intraorbital fat are unremarkable. Other: None. IMPRESSION: 1. 18 x 16 x 17 mm focus of peripheral hypo attenuation in the posterior  right parietooccipital region. This is nonspecific and could be related to subacute infarct, but given the somewhat rounded configuration, MRI of the brain without and with contrast recommended to further evaluate. 2. Chronic small vessel white matter ischemic disease. Electronically Signed   By: Misty Stanley M.D.   On: 09/27/2020 19:42   MR ANGIO HEAD WO CONTRAST  Result Date: 09/28/2020 CLINICAL DATA:  Initial evaluation for neuro deficit, stroke suspected, left-sided weakness. EXAM: MRI HEAD WITHOUT AND WITH CONTRAST MRA HEAD WITHOUT CONTRAST MRA NECK WITHOUT AND WITH CONTRAST TECHNIQUE: Multiplanar, multi-echo pulse sequences of the brain and surrounding structures were acquired without intravenous contrast. Angiographic images of the Circle of Willis were acquired using MRA technique without intravenous contrast. Angiographic images of the neck were acquired using MRA technique without and with intravenous contrast. Carotid stenosis measurements (when applicable) are obtained utilizing NASCET criteria, using the distal internal carotid diameter as the denominator. CONTRAST:  37mL GADAVIST GADOBUTROL 1 MMOL/ML IV SOLN COMPARISON:  Prior CT from 09/27/2020. FINDINGS: MRI HEAD FINDINGS Brain: Examination moderately degraded by motion artifact. Cerebral volume within normal limits. Patchy T2/FLAIR hyperintensity seen involving the periventricular deep white matter of both cerebral hemispheres, with patchy involvement of the thalami and pons, most consistent with chronic microvascular ischemic disease, moderate in nature. Few scatter remote lacunar infarcts present at the left thalamus and pons. Multiple scattered foci of restricted diffusion seen involving the bilateral cerebral and cerebellar hemispheres, consistent with acute to early subacute ischemic infarcts. The largest infarct involving the left cerebral hemisphere seen involving the cortical and subcortical posterior left frontal lobe in measures 1.4 cm  (series 7, image 51). Largest infarct involving the right cerebral hemisphere involves the right occipital cortex and measures 2 cm (series 5, image 74). This accounts for the hypodensity seen on prior CT. Few small cerebellar infarcts measure up to 9 mm on the right. Patchy infarct measuring 1.2 cm seen involving the central/right paracentral medulla (series 5, image 62). No associated hemorrhage or mass effect. Minimal associated enhancement seen about the dominant right occipital infarct. No mass lesion, midline shift, or significant mass effect. No hydrocephalus or extra-axial fluid collection. Pituitary gland suprasellar region within normal limits. Midline structures intact. No other abnormal enhancement. Vascular: Major intracranial vascular flow voids are maintained. Skull and upper cervical spine: Craniocervical junction within normal limits. Bone marrow signal intensity normal. No scalp soft tissue abnormality. Sinuses/Orbits: Globes and orbital soft tissues demonstrate no acute finding. Mild scattered mucosal thickening noted  within the ethmoidal air cells. Paranasal sinuses are otherwise clear. Trace bilateral mastoid effusions noted, of doubtful significance. Inner ear structures grossly normal. Other: None. MRA HEAD FINDINGS Anterior circulation: Examination degraded by motion artifact. Visualized distal cervical segments of the internal carotid arteries are patent with antegrade flow. Petrous segments patent bilaterally. Atheromatous irregularity throughout the carotid siphons bilaterally. No significant stenosis on the left. On the right, there is a probable moderate stenosis involving the proximal cavernous right ICA, better seen on corresponding MRA neck portion of this study (series 4, image 30). A1 segments patent bilaterally. Normal anterior communicating artery complex. Anterior cerebral arteries grossly patent to their distal aspects. No visible M1 stenosis or occlusion. Normal MCA  bifurcations. Distal MCA branches well perfused and fairly symmetric. Posterior circulation: Left vertebral artery dominant. Irregular moderate stenosis seen involving the distal left V4 segment just prior to the vertebrobasilar junction, better seen on MRA neck portion of this exam (series 2, image 54). Left PICA not visualized. Right vertebral artery diminutive occludes beyond the takeoff of the right PICA. Partially visualized proximal right PICA patent. Basilar patent proximally. Apparent short-segment severe stenosis involving the mid basilar artery on MIP reconstructions is not seen on corresponding time-of-flight sequence, favored to be artifactual. Mild to moderate stenosis noted at the basilar tip (series 2, image 44). Superior cerebellar arteries patent bilaterally. Right PCA primarily supplied via the basilar. Left PCA supplied via the basilar as well as a robust left posterior communicating artery. PCAs perfused to their distal aspects without definite high-grade stenosis. No visible aneurysm on this motion degraded exam. Anatomic variants: Diminutive right vertebral artery largely terminates in PICA. Prominent left posterior communicating artery. MRA NECK FINDINGS Aortic arch: Examination moderately degraded by motion. Visualized aortic arch normal caliber with normal branch pattern. No hemodynamically significant stenosis seen about the origin of the great vessels. Right carotid system: Right CCA patent from its origin to the bifurcation without stenosis. Atheromatous irregularity seen about the right carotid bulb/proximal right ICA with no more than mild 20% stenosis by NASCET criteria. Right ICA patent distally without stenosis, evidence for dissection, or occlusion. Left carotid system: Left CCA patent from its origin to the bifurcation without stenosis. Atheromatous irregularity with up to 70% short-segment stenosis at the origin of the left ICA (series 1060, image 3). Left ICA patent distally  without additional stenosis, evidence for dissection or occlusion. Vertebral arteries: Both vertebral arteries arise from subclavian arteries. Vertebral arteries are patent without hemodynamically significant stenosis. No evidence for dissection or occlusion. Other: None IMPRESSION: MRI HEAD: 1. 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 Given the various vascular distributions involved, a central thromboembolic etiology is suspected. 2. Underlying moderate chronic microvascular ischemic disease with a few scattered remote lacunar infarcts involving the left thalamus and pons. MRA HEAD: 1. Technically limited exam due to motion artifact. 2. Negative intracranial MRA for large vessel occlusion. Intracranial atherosclerotic disease with associated moderate stenoses involving the distal left V4 segment, distal basilar artery, and cavernous right ICA as above. 3. 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 MRA NECK: 1. Technically limited exam due to motion artifact. 2. Approximate 70% atheromatous stenosis at the origin of the left ICA. 3. Otherwise negative MRA of the neck. No other hemodynamically significant or correctable stenosis identified. Electronically Signed   By: Marland Kitchen  Jeannine Boga M.D.   On: 09/28/2020 03:18   MR ANGIO NECK W WO CONTRAST  Result Date: 09/28/2020 CLINICAL DATA:  Initial evaluation for neuro deficit, stroke suspected, left-sided weakness. EXAM: MRI HEAD WITHOUT AND WITH CONTRAST MRA HEAD WITHOUT CONTRAST MRA NECK WITHOUT AND WITH CONTRAST TECHNIQUE: Multiplanar, multi-echo pulse sequences of the brain and surrounding structures were acquired without intravenous contrast. Angiographic images of the Circle of Willis were acquired using MRA technique without intravenous contrast.  Angiographic images of the neck were acquired using MRA technique without and with intravenous contrast. Carotid stenosis measurements (when applicable) are obtained utilizing NASCET criteria, using the distal internal carotid diameter as the denominator. CONTRAST:  41mL GADAVIST GADOBUTROL 1 MMOL/ML IV SOLN COMPARISON:  Prior CT from 09/27/2020. FINDINGS: MRI HEAD FINDINGS Brain: Examination moderately degraded by motion artifact. Cerebral volume within normal limits. Patchy T2/FLAIR hyperintensity seen involving the periventricular deep white matter of both cerebral hemispheres, with patchy involvement of the thalami and pons, most consistent with chronic microvascular ischemic disease, moderate in nature. Few scatter remote lacunar infarcts present at the left thalamus and pons. Multiple scattered foci of restricted diffusion seen involving the bilateral cerebral and cerebellar hemispheres, consistent with acute to early subacute ischemic infarcts. The largest infarct involving the left cerebral hemisphere seen involving the cortical and subcortical posterior left frontal lobe in measures 1.4 cm (series 7, image 51). Largest infarct involving the right cerebral hemisphere involves the right occipital cortex and measures 2 cm (series 5, image 74). This accounts for the hypodensity seen on prior CT. Few small cerebellar infarcts measure up to 9 mm on the right. Patchy infarct measuring 1.2 cm seen involving the central/right paracentral medulla (series 5, image 62). No associated hemorrhage or mass effect. Minimal associated enhancement seen about the dominant right occipital infarct. No mass lesion, midline shift, or significant mass effect. No hydrocephalus or extra-axial fluid collection. Pituitary gland suprasellar region within normal limits. Midline structures intact. No other abnormal enhancement. Vascular: Major intracranial vascular flow voids are maintained. Skull and upper cervical spine: Craniocervical  junction within normal limits. Bone marrow signal intensity normal. No scalp soft tissue abnormality. Sinuses/Orbits: Globes and orbital soft tissues demonstrate no acute finding. Mild scattered mucosal thickening noted within the ethmoidal air cells. Paranasal sinuses are otherwise clear. Trace bilateral mastoid effusions noted, of doubtful significance. Inner ear structures grossly normal. Other: None. MRA HEAD FINDINGS Anterior circulation: Examination degraded by motion artifact. Visualized distal cervical segments of the internal carotid arteries are patent with antegrade flow. Petrous segments patent bilaterally. Atheromatous irregularity throughout the carotid siphons bilaterally. No significant stenosis on the left. On the right, there is a probable moderate stenosis involving the proximal cavernous right ICA, better seen on corresponding MRA neck portion of this study (series 4, image 30). A1 segments patent bilaterally. Normal anterior communicating artery complex. Anterior cerebral arteries grossly patent to their distal aspects. No visible M1 stenosis or occlusion. Normal MCA bifurcations. Distal MCA branches well perfused and fairly symmetric. Posterior circulation: Left vertebral artery dominant. Irregular moderate stenosis seen involving the distal left V4 segment just prior to the vertebrobasilar junction, better seen on MRA neck portion of this exam (series 2, image 54). Left PICA not visualized. Right vertebral artery diminutive occludes beyond the takeoff of the right PICA. Partially visualized proximal right PICA patent. Basilar patent proximally. Apparent short-segment severe stenosis involving the mid basilar artery on MIP reconstructions is not seen on corresponding time-of-flight sequence, favored to be artifactual. Mild to moderate stenosis noted  at the basilar tip (series 2, image 44). Superior cerebellar arteries patent bilaterally. Right PCA primarily supplied via the basilar. Left PCA  supplied via the basilar as well as a robust left posterior communicating artery. PCAs perfused to their distal aspects without definite high-grade stenosis. No visible aneurysm on this motion degraded exam. Anatomic variants: Diminutive right vertebral artery largely terminates in PICA. Prominent left posterior communicating artery. MRA NECK FINDINGS Aortic arch: Examination moderately degraded by motion. Visualized aortic arch normal caliber with normal branch pattern. No hemodynamically significant stenosis seen about the origin of the great vessels. Right carotid system: Right CCA patent from its origin to the bifurcation without stenosis. Atheromatous irregularity seen about the right carotid bulb/proximal right ICA with no more than mild 20% stenosis by NASCET criteria. Right ICA patent distally without stenosis, evidence for dissection, or occlusion. Left carotid system: Left CCA patent from its origin to the bifurcation without stenosis. Atheromatous irregularity with up to 70% short-segment stenosis at the origin of the left ICA (series 1060, image 3). Left ICA patent distally without additional stenosis, evidence for dissection or occlusion. Vertebral arteries: Both vertebral arteries arise from subclavian arteries. Vertebral arteries are patent without hemodynamically significant stenosis. No evidence for dissection or occlusion. Other: None IMPRESSION: MRI HEAD: 1. 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 Given the various vascular distributions involved, a central thromboembolic etiology is suspected. 2. Underlying moderate chronic microvascular ischemic disease with a few scattered remote lacunar infarcts involving the left thalamus and pons. MRA HEAD: 1. Technically limited exam due to motion artifact. 2. Negative  intracranial MRA for large vessel occlusion. Intracranial atherosclerotic disease with associated moderate stenoses involving the distal left V4 segment, distal basilar artery, and cavernous right ICA as above. 3. 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 MRA NECK: 1. Technically limited exam due to motion artifact. 2. Approximate 70% atheromatous stenosis at the origin of the left ICA. 3. Otherwise negative MRA of the neck. No other hemodynamically significant or correctable stenosis identified. Electronically Signed   By: Jeannine Boga M.D.   On: 09/28/2020 03:18   MR Brain W and Wo Contrast  Result Date: 09/28/2020 CLINICAL DATA:  Initial evaluation for neuro deficit, stroke suspected, left-sided weakness. EXAM: MRI HEAD WITHOUT AND WITH CONTRAST MRA HEAD WITHOUT CONTRAST MRA NECK WITHOUT AND WITH CONTRAST TECHNIQUE: Multiplanar, multi-echo pulse sequences of the brain and surrounding structures were acquired without intravenous contrast. Angiographic images of the Circle of Willis were acquired using MRA technique without intravenous contrast. Angiographic images of the neck were acquired using MRA technique without and with intravenous contrast. Carotid stenosis measurements (when applicable) are obtained utilizing NASCET criteria, using the distal internal carotid diameter as the denominator. CONTRAST:  53mL GADAVIST GADOBUTROL 1 MMOL/ML IV SOLN COMPARISON:  Prior CT from 09/27/2020. FINDINGS: MRI HEAD FINDINGS Brain: Examination moderately degraded by motion artifact. Cerebral volume within normal limits. Patchy T2/FLAIR hyperintensity seen involving the periventricular deep white matter of both cerebral hemispheres, with patchy involvement of the thalami and pons, most consistent with chronic microvascular ischemic disease, moderate in nature. Few scatter remote lacunar infarcts present at the left thalamus and pons. Multiple scattered foci of restricted diffusion seen  involving the bilateral cerebral and cerebellar hemispheres, consistent with acute to early subacute ischemic infarcts. The largest infarct involving the left  cerebral hemisphere seen involving the cortical and subcortical posterior left frontal lobe in measures 1.4 cm (series 7, image 51). Largest infarct involving the right cerebral hemisphere involves the right occipital cortex and measures 2 cm (series 5, image 74). This accounts for the hypodensity seen on prior CT. Few small cerebellar infarcts measure up to 9 mm on the right. Patchy infarct measuring 1.2 cm seen involving the central/right paracentral medulla (series 5, image 62). No associated hemorrhage or mass effect. Minimal associated enhancement seen about the dominant right occipital infarct. No mass lesion, midline shift, or significant mass effect. No hydrocephalus or extra-axial fluid collection. Pituitary gland suprasellar region within normal limits. Midline structures intact. No other abnormal enhancement. Vascular: Major intracranial vascular flow voids are maintained. Skull and upper cervical spine: Craniocervical junction within normal limits. Bone marrow signal intensity normal. No scalp soft tissue abnormality. Sinuses/Orbits: Globes and orbital soft tissues demonstrate no acute finding. Mild scattered mucosal thickening noted within the ethmoidal air cells. Paranasal sinuses are otherwise clear. Trace bilateral mastoid effusions noted, of doubtful significance. Inner ear structures grossly normal. Other: None. MRA HEAD FINDINGS Anterior circulation: Examination degraded by motion artifact. Visualized distal cervical segments of the internal carotid arteries are patent with antegrade flow. Petrous segments patent bilaterally. Atheromatous irregularity throughout the carotid siphons bilaterally. No significant stenosis on the left. On the right, there is a probable moderate stenosis involving the proximal cavernous right ICA, better seen on  corresponding MRA neck portion of this study (series 4, image 30). A1 segments patent bilaterally. Normal anterior communicating artery complex. Anterior cerebral arteries grossly patent to their distal aspects. No visible M1 stenosis or occlusion. Normal MCA bifurcations. Distal MCA branches well perfused and fairly symmetric. Posterior circulation: Left vertebral artery dominant. Irregular moderate stenosis seen involving the distal left V4 segment just prior to the vertebrobasilar junction, better seen on MRA neck portion of this exam (series 2, image 54). Left PICA not visualized. Right vertebral artery diminutive occludes beyond the takeoff of the right PICA. Partially visualized proximal right PICA patent. Basilar patent proximally. Apparent short-segment severe stenosis involving the mid basilar artery on MIP reconstructions is not seen on corresponding time-of-flight sequence, favored to be artifactual. Mild to moderate stenosis noted at the basilar tip (series 2, image 44). Superior cerebellar arteries patent bilaterally. Right PCA primarily supplied via the basilar. Left PCA supplied via the basilar as well as a robust left posterior communicating artery. PCAs perfused to their distal aspects without definite high-grade stenosis. No visible aneurysm on this motion degraded exam. Anatomic variants: Diminutive right vertebral artery largely terminates in PICA. Prominent left posterior communicating artery. MRA NECK FINDINGS Aortic arch: Examination moderately degraded by motion. Visualized aortic arch normal caliber with normal branch pattern. No hemodynamically significant stenosis seen about the origin of the great vessels. Right carotid system: Right CCA patent from its origin to the bifurcation without stenosis. Atheromatous irregularity seen about the right carotid bulb/proximal right ICA with no more than mild 20% stenosis by NASCET criteria. Right ICA patent distally without stenosis, evidence for  dissection, or occlusion. Left carotid system: Left CCA patent from its origin to the bifurcation without stenosis. Atheromatous irregularity with up to 70% short-segment stenosis at the origin of the left ICA (series 1060, image 3). Left ICA patent distally without additional stenosis, evidence for dissection or occlusion. Vertebral arteries: Both vertebral arteries arise from subclavian arteries. Vertebral arteries are patent without hemodynamically significant stenosis. No evidence for dissection or occlusion. Other: None IMPRESSION: MRI  HEAD: 1. 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 Given the various vascular distributions involved, a central thromboembolic etiology is suspected. 2. Underlying moderate chronic microvascular ischemic disease with a few scattered remote lacunar infarcts involving the left thalamus and pons. MRA HEAD: 1. Technically limited exam due to motion artifact. 2. Negative intracranial MRA for large vessel occlusion. Intracranial atherosclerotic disease with associated moderate stenoses involving the distal left V4 segment, distal basilar artery, and cavernous right ICA as above. 3. 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 MRA NECK: 1. Technically limited exam due to motion artifact. 2. Approximate 70% atheromatous stenosis at the origin of the left ICA. 3. Otherwise negative MRA of the neck. No other hemodynamically significant or correctable stenosis identified. Electronically Signed   By: Jeannine Boga M.D.   On: 09/28/2020 03:18   DG Chest Portable 1 View  Result Date: 09/27/2020 CLINICAL DATA:  Code STEMI.  Shortness of breath and recent MI. EXAM: PORTABLE CHEST 1 VIEW COMPARISON:  09/23/2020 FINDINGS: The heart size and mediastinal contours are within  normal limits. Both lungs are clear. The visualized skeletal structures are unremarkable. IMPRESSION: No active disease. Electronically Signed   By: Lucienne Capers M.D.   On: 09/27/2020 20:02   ABORTED INVASIVE LAB PROCEDURE  Result Date: 09/28/2020 This case was aborted.   Cardiac Studies   Echo 09/22/20:  1. Apex well-visualized without contrast - false tendon (normal variant)  - no thrombus. Left ventricular ejection fraction, by estimation, is 35 to  40%. The left ventricle has moderately decreased function. The left  ventricle demonstrates regional wall  motion abnormalities (see scoring diagram/findings for description). There  is mild left ventricular hypertrophy. Left ventricular diastolic  parameters are consistent with Grade I diastolic dysfunction (impaired  relaxation). Elevated left ventricular  end-diastolic pressure. There is severe hypokinesis of the left  ventricular, entire anterior wall, anteroseptal wall, apical segment and  inferoapical segment. Findings suggest LAD territory ischemia/infarct.   2. Right ventricular systolic function is normal. The right ventricular  size is normal. There is normal pulmonary artery systolic pressure.   3. The mitral valve is abnormal. Mild mitral valve regurgitation.   4. The aortic valve is tricuspid. Aortic valve regurgitation is not  visualized.   5. The inferior vena cava is normal in size with <50% respiratory  variability, suggesting right atrial pressure of 8 mmHg.  LHC 09/22/20: RPDA lesion is 50% stenosed. Dist Cx lesion is 30% stenosed with 30% stenosed side branch in LPAV. 1st Diag lesion is 60% stenosed. Mid LAD lesion is 100% stenosed. Post intervention, there is a 0% residual stenosis. A drug-eluting stent was successfully placed using a STENT ONYX FRONTIER [id number]. LV end diastolic pressure is mildly elevated.   1. 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 Mildly elevated LVEDP 21 mmHg 3.  Successful PCI of the mid LAD with DES x 1   Diagnostic Dominance: Co-dominant      Intervention        _____________  Patient Profile     60 y.o. female with a PMH of recent STEMI s/p PCI/DES to LAD 09/21/20, ICM/Chronic combined CHF, HTN, HLD, DM type 2, who presented with left sided weakness, found to have an acute CVA.   Assessment & Plan    1. Acute CVA:  patient presented with left sided weakness which she reports starting 09/24/20 after returning home from a hospital stay for STEMI earlier that day. Found to have acute/subacute non-hemorrhagic strokes on MRI. Neurology following and recommending permissive HTN. Suspicions high for cardioembolic source. Echo pending to evaluate for LV thrombus - Will hold antihypertensives at this time to allow for permissive HTN - plan to restart when cleared by neurology - Continue aspirin and brilinta - Continue statin - May benefit from TEE if echo unrevealing  2. Recent STEMI: patient presented with anterior STEMI-delayed presentation 09/21/20 and underwent LHC with PCI/DES to LAD. She had an ICM with EF 35-40% with anterior hypokinesis but no apical thrombus at that time.  - Continue aspirin and brilinta - Continue statin  3. ICM/Chronic combined CHF: Echo 09/22/20 showed EF 35-40% with severe hypokinesis of the anterior wall, G1DD, mild LVH, and mild MR. She was recently started on carvedilol, entresto, and spironolactone. Patient reported orthopnea and SOB on this admission. BNP 724, up from 600s 1 week ago. CXR clear lungs with no acute findings. Home BP meds on hold for permissive hypertension in the setting of #1. She appears euvolemic on exam today.  - Anticipate restarting carvedilol, entresto, and spironolactone when cleared by neurology - Monitor volume status closely with daily weights and strict I&Os   4. HLD: LDL 130 this admission; recently started on low dose crestor 1 week ago. LFTs normal this admission - Agree with increasing  crestor to 40mg  daily - Will need repeat FLP/LFTs in 6-8 weeks     Telephone note from 09/27/20 indicates issues with affording medications, including crestor, entresto, and brilinta with limited prescription fund of $100/mo. Patient is uninsured. Will place order for case management to assist     For questions or updates, please contact [place] Please consult [email or web address] for contact info under        Signed, Abigail Butts, PA-C  09/28/2020, 9:31 AM    Patient seen and examined and agree with Roby Lofts, PA-C as detailed above.  In brief, the patient is a 60 y.o. female with a PMH of recent STEMI s/p PCI/DES to LAD 09/21/20, ICM/Chronic combined CHF, HTN, HLD, DM type 2, who presented with left sided weakness and shortness of breath found to have an acute CVA and acute on chronic combined HF exacerbation. Cardiology is consulted for HFrEF management and for persistent STE on ECG.  Patient initially presented with left sided weakness and SOB. ECG showed persistent STE in the lateral leads and a CODE STEMI was called. Patient was evaluated by Dr. Ellyn Hack and ECG elevations actually improved from prior during initial hospitalization for STEMI where she underwent DES to mid-LAD which was 100% occluded. Code STEMI cancelled and patient was recommended for continued management of acute CVA as well as TTE to assess for LV thrombus/aneurysm.  Work-up here notable for MRI head demonstrating multiple acute/subacute ischemic strokes. TTE 09/22/20 with no evidence of LV thrombus, but notable for EF 35-40% with LAD territory WMA. Repeat TTE pending. Currently, antihypertensives/GDMT being held for permissive hypertension. DAPT continued.   GEN: No acute distress.   Neck: No JVD Cardiac: RRR, no murmurs, rubs, or gallops.  Respiratory: Clear to auscultation bilaterally. GI: Soft, nontender, non-distended  MS: No edema; No deformity. Neuro:  Nonfocal  Psych: Normal affect     Plan: -Follow-up repeat TTE with definity to assess for LV thrombus/cardiac source of embolus -If TTE negative, agree that patient will need TEE +/- loop for  further evaluation -GDMT held due to need for permissive hypertension in setting of acute stroke; will add as able -Clinically euvolemic, not on diuretics currently -Continue ASA 81mg  daily and ticagrelor 90mg  BID -Continue crestor 40mg  daily  Gwyndolyn Kaufman, MD

## 2020-09-28 NOTE — Evaluation (Signed)
Physical Therapy Evaluation Patient Details Name: Tracey Meyer MRN: [id number] DOB: April 11, 1960 Today's Date: 09/28/2020   History of Present Illness  59 yo female presents to [facility] on 7/7 with L weakness, shortness of breath that started on 7/4. 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 CTH specifically shows focus of peripheral hypoattenuation in posterior R parietooccipital lobe. PMH significant for DM2, HTN, ischemic cardiomyopathy EF 35-40%, tobacco use and trying to quit, HLD, breast cancer with L radical mastectomy, CAD s/p angio and stenting of mLAD d/c 7/3.  Clinical Impression   Pt presents with L hemiplegia, impaired balance, difficulty performing mobility tasks, and decreased activity tolerance vs baseline. Pt to benefit from acute PT to address deficits. Pt requiring mod assist to move to/from EOB, stand not attempted given PT being alone (pt will need +2 for standing trials) and pt fatigued from poor sleep overnight. At baseline, pt is independent and would like to maximize functional recovery. PT recommending CIR consult. PT to progress mobility as tolerated, and will continue to follow acutely.      Follow Up Recommendations CIR    Equipment Recommendations  Other (comment) (tbd pending progress)    Recommendations for Other Services Rehab consult     Precautions / Restrictions Precautions Precautions: Fall Precaution Comments: L hemiplegia Restrictions Weight Bearing Restrictions: No      Mobility  Bed Mobility Overal bed mobility: Needs Assistance Bed Mobility: Supine to Sit;Sit to Supine     Supine to sit: Mod assist Sit to supine: Mod assist   General bed mobility comments: mod assist for trunk and LLE lifting/translation into and out of bed. Increased time, sequencing cues. Boost assist upon return to supine.    Transfers Overall transfer level: Needs assistance Equipment  used: None Transfers: Lateral/Scoot Transfers          Lateral/Scoot Transfers: Mod assist General transfer comment: mod assist for x3 scoots towards L for trunk lift, pivoting hips.  Ambulation/Gait             General Gait Details: NT on eval  Stairs            Wheelchair Mobility    Modified Rankin (Stroke Patients Only) Modified Rankin (Stroke Patients Only) Pre-Morbid Rankin Score: No symptoms Modified Rankin: Severe disability     Balance Overall balance assessment: Needs assistance Sitting-balance support: No upper extremity supported;Feet supported Sitting balance-Leahy Scale: Poor Sitting balance - Comments: fair to poor; initially requiring min assist to maintain sitting balance but progressed to supervision                                     Pertinent Vitals/Pain Pain Assessment: Faces Faces Pain Scale: Hurts a little bit Pain Location: L hand (IV site) Pain Descriptors / Indicators: Discomfort;Grimacing Pain Intervention(s): Limited activity within patient's tolerance;Repositioned    Home Living Family/patient expects to be discharged to:: Private residence Living Arrangements: Spouse/significant other Available Help at Discharge: Family;Available 24 hours/day (boyfriend is on disability but can assist, sister lives next door but works M-F) Type of Home: Mobile home Home Access: Stairs to enter   Technical brewer of Steps: [place]: One level [place]: [place] - single point      Prior Function Level of Independence: Independent         Comments: Pt reports "I am a tough cookie, I  like doing things for myself"     Hand Dominance   Dominant Hand: Right    Extremity/Trunk Assessment   Upper Extremity Assessment Upper Extremity Assessment: Defer to OT evaluation (No active movement appreciated in elbow flexors/extensors, shoulder abd/add ER/IR flex/ext, wrist or finger flex/ext)    Lower Extremity  Assessment Lower Extremity Assessment: LLE deficits/detail LLE Deficits / Details: 1/5 knee flexors; all other motions 0/5 LLE Sensation: WNL    Cervical / Trunk Assessment Cervical / Trunk Assessment: Normal  Communication   Communication: No difficulties  Cognition Arousal/Alertness: Awake/alert Behavior During Therapy: WFL for tasks assessed/performed Overall Cognitive Status: Within Functional Limits for tasks assessed                                        General Comments General comments (skin integrity, edema, etc.): vss; alarm for ST elevation, documented history of recent STEMI with cardiology following and code STEMI cancelled    Exercises     Assessment/Plan    PT Assessment Patient needs continued PT services  PT Problem List Decreased strength;Decreased mobility;Decreased safety awareness;Decreased activity tolerance;Decreased balance;Decreased knowledge of use of DME;Decreased coordination;Decreased knowledge of precautions;Impaired tone       PT Treatment Interventions DME instruction;Therapeutic activities;Gait training;Therapeutic exercise;Patient/family education;Balance training;Functional mobility training;Neuromuscular re-education    PT Goals (Current goals can be found in the Care Plan section)  Acute Rehab PT Goals Patient Stated Goal: be more independent PT Goal Formulation: With patient Time For Goal Achievement: 10/12/20 Potential to Achieve Goals: Good    Frequency Min 4X/week   Barriers to discharge        Co-evaluation               AM-PAC PT "6 Clicks" Mobility  Outcome Measure Help needed turning from your back to your side while in a flat bed without using bedrails?: A Lot Help needed moving from lying on your back to sitting on the side of a flat bed without using bedrails?: A Lot Help needed moving to and from a bed to a chair (including a wheelchair)?: A Lot Help needed standing up from a chair using your  arms (e.g., wheelchair or bedside chair)?: Total Help needed to walk in hospital room?: Total Help needed climbing 3-5 steps with a railing? : Total 6 Click Score: 9    End of Session   Activity Tolerance: Patient tolerated treatment well;Patient limited by fatigue Patient left: in bed;with call bell/phone within reach;with bed alarm set;Other (comment) (LUE propped on pillows for edema management, proper positioning of L shoulder) Nurse Communication: Mobility status PT Visit Diagnosis: Other abnormalities of gait and mobility (R26.89);Hemiplegia and hemiparesis Hemiplegia - Right/Left: Left Hemiplegia - dominant/non-dominant: Non-dominant Hemiplegia - caused by: Cerebral infarction    Time: 1443-1540 PT Time Calculation (min) (ACUTE ONLY): 22 min   Charges:   PT Evaluation $PT Eval Low Complexity: 1 Low        Charlynn Salih S, PT DPT Acute Rehabilitation Services Pager [phone]  Office [phone]   Fate Galanti E Ruffin Pyo 09/28/2020, 5:06 PM

## 2020-09-28 NOTE — Consult Note (Signed)
NEUROLOGY CONSULTATION NOTE   Date of service: September 28, 2020 Patient Name: Tracey Meyer MRN:  [id number] DOB:  23-Oct-1960 Reason for consult: "L sided weakness" Requesting Provider: Etta Quill, DO _ _ _   _ __   _ __ _ _  __ __   _ __   __ _  History of Present Illness  Tracey Meyer is a 60 y.o. female with PMH significant for DM2, tobacco use and trying to quit, HLD, CAD s/p angio and stenting of mLAD a few days ago who presents with L sided weakness that started on 09/24/20.  She just had stenting of mLAD and left the hospital around noon on 09/24/20. In the evening, she had sudden onset left sided weakness. Could not move her L arm or left leg. Significant other has been helping out with ADLs. She eventually came to the hospital when she did not improve.  CTH with a 18 x 16 x 17 mm focus of peripheral hypo attenuation in the posterior right parietooccipital region. This is nonspecific and could be related to subacute infarct, but given the somewhat rounded configuration, MRI of the brain without and with contrast recommended to further evaluate.   She has been trying to quit smoking and last cigarrette use was Friday and has not smoked in about a week which she is really proud of.  She has also been having shortness of breath and it gets worse with lying flat.  mRS: 1 tPA/Thrombectyom: outside the window NIHSS components Score: Comment  1a Level of Conscious 0[x]  1[]  2[]  3[]      1b LOC Questions 0[x]  1[]  2[]       1c LOC Commands 0[x]  1[]  2[]       2 Best Gaze 0[x]  1[]  2[]       3 Visual 0[x]  1[]  2[]  3[]      4 Facial Palsy 0[x]  1[]  2[]  3[]      5a Motor Arm - left 0[]  1[]  2[]  3[x]  4[]  UN[]    5b Motor Arm - Right 0[x]  1[]  2[]  3[]  4[]  UN[]    6a Motor Leg - Left 0[]  1[]  2[]  3[x]  4[]  UN[]    6b Motor Leg - Right 0[x]  1[]  2[]  3[]  4[]  UN[]    7 Limb Ataxia 0[x]  1[]  2[]  3[]  UN[]     8 Sensory 0[x]  1[]  2[]  UN[]      9 Best Language 0[x]  1[]  2[]  3[]      10 Dysarthria 0[x]  1[]  2[]   UN[]      11 Extinct. and Inattention 0[x]  1[]  2[]       TOTAL: 6       ROS   Constitutional Denies weight loss, fever and chills.   HEENT Denies changes in vision and hearing.   Respiratory Endorses mild SOB but no cough. Sob is worse with lying flat.  CV Denies palpitations and CP   GI Denies abdominal pain, nausea, vomiting and diarrhea.   GU Denies dysuria and urinary frequency.   MSK Denies myalgia and joint pain.   Skin Denies rash and pruritus.   Neurological Denies headache and syncope.   Psychiatric Denies recent changes in mood. Denies anxiety and depression.    Past History   Past Medical History:  Diagnosis Date  . Cardiomyopathy, ischemic 09/24/2020  . DM (diabetes mellitus), type 2 ([place]) 09/24/2020  . HTN (hypertension)   . Hyperlipidemia   . Prediabetes   . S/P angioplasty with stent to mLAD 09/22/20  09/24/2020  . Tobacco abuse 09/24/2020  . Transaminitis 09/24/2020   Past  Surgical History:  Procedure Laterality Date  . BREAST SURGERY Left    age 19  . CORONARY/GRAFT ACUTE MI REVASCULARIZATION N/A 09/22/2020   Procedure: Coronary/Graft Acute MI Revascularization;  Surgeon: Martinique, Peter M, MD;  Location: [place] [facility];  Service: Cardiovascular;  Laterality: N/A;  . LEFT HEART CATH AND CORONARY ANGIOGRAPHY N/A 09/22/2020   Procedure: LEFT HEART CATH AND CORONARY ANGIOGRAPHY;  Surgeon: Martinique, Peter M, MD;  Location: [place] [facility];  Service: Cardiovascular;  Laterality: N/A;   Family History  Problem Relation Age of Onset  . Cancer Mother   . Heart disease Brother    Social History   Socioeconomic History  . Marital status: Single    Spouse name: Not on file  . Number of children: Not on file  . Years of education: Not on file  . Highest education level: Not on file  Occupational History  . Not on file  Tobacco Use  . Smoking status: Every Day    Packs/day: 0.50    Pack years: 0.00    Types: Cigarettes  . Smokeless tobacco: Never  Substance and  Sexual Activity  . Alcohol use: Not on file  . Drug use: Never  . Sexual activity: Not on file  Other Topics Concern  . Not on file  Social History Narrative  . Not on file   Social Determinants of Health   Financial Resource Strain: Not on file  Food Insecurity: Not on file  Transportation Needs: Not on file  Physical Activity: Not on file  Stress: Not on file  Social Connections: Not on file   No Known Allergies  Medications  (Not in a hospital admission)    Vitals   Vitals:   09/27/20 1935 09/27/20 1945 09/27/20 2000 09/27/20 2015  BP:  115/68 117/62 (!) 111/47  Pulse:  61 60 67  Resp:  (!) 21 20 (!) 24  Temp:      TempSrc:      SpO2:  97% 97% 96%  Weight: 79.4 kg     Height: 5\' 4"  (1.626 m)        Body mass index is 30.04 kg/m.  Physical Exam   General: Laying comfortably in bed; in no acute distress.  HENT: Normal oropharynx and mucosa. Normal external appearance of ears and nose.  Neck: Supple, no pain or tenderness  CV: No JVD. No peripheral edema.  Pulmonary: Symmetric Chest rise. Normal respiratory effort.  Abdomen: Soft to touch, non-tender.  Ext: No cyanosis, edema, or deformity  Skin: No rash. Normal palpation of skin.   Musculoskeletal: Normal digits and nails by inspection. No clubbing.   Neurologic Examination  Mental status/Cognition: Alert, oriented to self, place, month and year, good attention.  Speech/language: Fluent, comprehension intact, object naming intact, repetition intact.  Cranial nerves:   CN II L pupil 45mm, R pupil 31mm with clouded cornea BL and poorly reactive pupils. no VF deficits. Feels left eye is worse than right.   CN III,IV,VI EOM intact, no gaze preference or deviation, no nystagmus    CN V normal sensation in V1, V2, and V3 segments bilaterally    CN VII no asymmetry, no nasolabial fold flattening    CN VIII normal hearing to speech    CN IX & X normal palatal elevation, no uvular deviation    CN XI 5/5 head turn  and 5/5 shoulder shrug bilaterally   CN XII midline tongue protrusion    Motor:  Muscle bulk: normal, tone  flaccid in LUE and LLE Mvmt Root Nerve  Muscle Right Left Comments  SA C5/6 Ax Deltoid 5 1   EF C5/6 Mc Biceps 5 0   EE C6/7/8 Rad Triceps 5 0   WF C6/7 Med FCR 5 0   WE C7/8 PIN ECU 5 0   F Ab C8/T1 U ADM/FDI 5 0   HF L1/2/3 Fem Illopsoas 5 1   KE L2/3/4 Fem Quad 5 4   DF L4/5 D Peron Tib Ant 5 5   PF S1/2 Tibial Grc/Sol 5 5    Reflexes:  Right Left Comments  Pectoralis      Biceps (C5/6) 2 2   Brachioradialis (C5/6) 2 2    Triceps (C6/7) 2 2    Patellar (L3/4) 3 3    Achilles (S1) 2 2    Hoffman      Plantar     Jaw jerk    Sensation:  Light touch intact   Pin prick    Temperature    Vibration   Proprioception    Coordination/Complex Motor:  - Finger to Nose intact on the right - Heel to shin intact on the right. - Rapid alternating movement are intact on the right. - Gait: unsafe to assess given profound weakness on the left.  Labs   CBC:  Recent Labs  Lab 09/24/20 0317 09/27/20 1900 09/27/20 1950  WBC 10.3 6.9  --   NEUTROABS  --  4.9  --   HGB 13.1 12.1 12.2  HCT 38.5 36.4 36.0  MCV 91.7 94.3  --   PLT 287 352  --     Basic Metabolic Panel:  Lab Results  Component Value Date   NA 138 09/27/2020   K 3.8 09/27/2020   CO2 18 (L) 09/27/2020   GLUCOSE 201 (H) 09/27/2020   BUN 26 (H) 09/27/2020   CREATININE 1.20 (H) 09/27/2020   CALCIUM 9.7 09/27/2020   GFRNONAA 47 (L) 09/27/2020   Lipid Panel:  Lab Results  Component Value Date   LDLCALC 138 (H) 09/23/2020   HgbA1c:  Lab Results  Component Value Date   HGBA1C 6.9 (H) 09/22/2020   Urine Drug Screen: No results found for: LABOPIA, COCAINSCRNUR, LABBENZ, AMPHETMU, THCU, LABBARB  Alcohol Level     Component Value Date/Time   ETH <10 09/27/2020 1938    CT Head without contrast(personally reviewed): 18 x 16 x 17 mm focus of peripheral hypo attenuation in the posterior right  parietooccipital region. This is nonspecific and could be related to subacute infarct, but given the somewhat rounded configuration, MRI of the brain without and with contrast recommended to further evaluate.  MRI Brain: Pending  Impression   Tracey Meyer is a 60 y.o. female with stenting of mid LAD last week, smoker and trying to quit who presents with 4 days of LUE and LLE weakness.  Clinically symptoms do seem to be suggestive of a stroke. CTH does demonstrate a parieto-occipital oblong peripheral lesion but I am not certain if this lesion matches with her symptoms.  I do agree with radiology that she should get MRI Brain w/wo contrast to beter characterize this lesion and to evaluate for a stroke.  She has a hx of remote breast cancer when she was 89 but has been in remission since.  Primary Diagnosis:  Other cerebral infarction due to occlusion of stenosis of small artery. Cortical lesion in R parieto-occipital lobe, ?malignancy Recommendations  Plan:  - Frequent Neuro checks per stroke unit  protocol - Recommend brain imaging with MRI Brain with and without contrast - Recommend Vascular imaging with MRA Angio Head without contrast and US Carotid doppler - Recommend obtaining TTE - Recommend obtaining Lipid panel with LDL - Please start statin if LDL > 70 - Recommend HbA1c - Antithrombotic - aspirin 81mg  daily - Recommend DVT ppx - SBP goal - permissive hypertension first 24 h < 220/110. Held home meds.  - Recommend Telemetry monitoring for arrythmia - Recommend bedside swallow screen prior to PO intake. - Stroke education booklet - Recommend PT/OT/SLP consult - Recommend Urine Tox screen.  ______________________________________________________________________   Thank you for the opportunity to take part in the care of this patient. If you have any further questions, please contact the neurology consultation attending.  Signed,  [place] Pager Number [phone] _ _ _   _ __   _ __ _ _  __ __   _ __   __ _

## 2020-09-28 NOTE — Progress Notes (Addendum)
PROGRESS NOTE    Tracey Meyer   OXB:[id number]  DOB: 1961/03/09  DOA: 09/27/2020 PCP: Pcp, No   Brief Narrative:  Tracey Meyer is a 60 y/o F with CAD (recent stent to the LAD on 7/4), ischemic cardiomyopathy with EF of 35-40%, HTN, DM2, HLD and smoking who presented to the ED with shortness of breath and orthopnea.  She also stated that on the evening of 7/4, she developed left sided weakness and slurred speech which did not improve.   In ED: CT head> 18 x 16 x 17 mm focus of peripheral hypo attenuation in the posterior right parietooccipital region. This is nonspecific and could be related to subacute infarct, but given the somewhat rounded configuration, MRI of the brain without and with contrast recommended to further evaluate.  Cardiology and neurology consulted. IV Lasix started and MRI/ MRA ordered. Admitted to treat acute CHF and work up neurological symptoms.  Subjective: She feels that her breathing is better.  She has persistent inability to move her left arm and left leg.    Assessment & Plan:   Principal Problem:   Acute ischemic strokes- multifocal and bilateral -symptoms occurred on the night of 7/4 after she got home from the hospital - MRI > Multiple scattered acute to early subacute ischemic nonhemorrhagic infarcts involving the bilateral cerebral and cerebellar hemispheres as above, with largest area of infarction measuring up to 2 cm at the right occipital cortex. Given the various vascular distributions involved, a central thromboembolic etiology is suspected. -LDL is 130 -Lobe and A1c is 6.8 -Neurology recommending to continue 81 mg aspirin and Brilinta - Awaiting PT/OT eval's -2D echo does not reveal a thrombus any thrombus   Active Problems: Acute on chronic systolic heart failure CAD with NSTEMI and stent in mid LAD on 09/22/2020 -Echo 09/22/2020> EF 35 to 40%, mild LVH, grade 1 diastolic dysfunction, severe hypokinesis of the LV entire  anterior wall anteroseptal wall apical segment and inferior apical segment. -Echo 09/28/2020> EF 30 to 68%, grade 2 diastolic dysfunction -Given 20 mg of IV Lasix on 7/7 - Cardiology assisting with management  -Antihypertensives being held to allow for permissive hypertension    Hyperlipidemia -Crestor started after MI-being increased this admission to 40 mg daily  Urinary retention - Patient had the urge to urinate but was unable to-bladder scan revealed 700 cc - In-N-Out cath done and 600 cc of urine    DM (diabetes mellitus), type 2 (HCC) -On metformin at home -Hemoglobin A1C    Component Value Date/Time   HGBA1C 6.8 (H) 09/28/2020 0403     Time spent in minutes: 35 DVT prophylaxis: enoxaparin (LOVENOX) injection 40 mg Start: 09/28/20 0800  Code Status: full code Family Communication:  Level of Care: Level of care: Telemetry Cardiac Disposition Plan:  Status is: Inpatient  Remains inpatient appropriate because:Inpatient level of care appropriate due to severity of illness  Dispo: The patient is from: Home              Anticipated d/c is to:  TBD              Patient currently is not medically stable to d/c.   Difficult to place patient Yes      Consultants:  Cardiology Neurology  Procedures:  none Antimicrobials:  Anti-infectives (From admission, onward)    None        Objective: Vitals:   09/28/20 0215 09/28/20 0415 09/28/20 0600 09/28/20 0730  BP: 112/75 111/63 139/78 (!) 144/82  Pulse: 60 69 62 70  Resp: 11 20  18   Temp:    98.2 F (36.8 C)  TempSrc:    Oral  SpO2: 95% 99% 96% 93%  Weight:      Height:       No intake or output data in the 24 hours ending 09/28/20 0801 Filed Weights   09/27/20 1935  Weight: 79.4 kg    Examination: General exam: Appears comfortable  HEENT: PERRLA, oral mucosa moist, no sclera icterus or thrush Respiratory system: Clear to auscultation. Respiratory effort normal. Cardiovascular system: S1 & S2 heard,  RRR.   Gastrointestinal system: Abdomen soft, non-tender, nondistended. Normal bowel sounds. Central nervous system: Alert and oriented.  Right side strength is 0 out of 5 Extremities: No cyanosis, clubbing or edema Skin: No rashes or ulcers Psychiatry: Flat affect    Data Reviewed: I have personally reviewed following labs and imaging studies  CBC: Recent Labs  Lab 09/22/20 1233 09/23/20 0134 09/24/20 0317 09/27/20 1900 09/27/20 1950  WBC 11.7* 12.9* 10.3 6.9  --   NEUTROABS  --   --   --  4.9  --   HGB 13.9 12.7 13.1 12.1 12.2  HCT 41.0 38.3 38.5 36.4 36.0  MCV 93.2 93.2 91.7 94.3  --   PLT 337 249 287 352  --    Basic Metabolic Panel: Recent Labs  Lab 09/22/20 0940 09/22/20 1233 09/23/20 0134 09/24/20 0317 09/27/20 1900 09/27/20 1950  NA 137  --  132* 133* 134* 138  K 3.8  --  3.9 3.4* 3.8 3.8  CL 103  --  100 104 104 108  CO2  --   --  22 21* 18*  --   GLUCOSE 264*  --  194* 142* 205* 201*  BUN 18  --  28* 27* 26* 26*  CREATININE 0.80 1.10* 1.14* 1.09* 1.30* 1.20*  CALCIUM  --   --  9.7 9.3 9.7  --    GFR: Estimated Creatinine Clearance: 51.5 mL/min (A) (by C-G formula based on SCr of 1.2 mg/dL (H)). Liver Function Tests: Recent Labs  Lab 09/24/20 0317 09/27/20 1900  AST 163* 28  ALT 55* 26  ALKPHOS 48 47  BILITOT 1.1 0.5  PROT 6.8 6.8  ALBUMIN 2.9* 3.0*   No results for input(s): LIPASE, AMYLASE in the last 168 hours. No results for input(s): AMMONIA in the last 168 hours. Coagulation Profile: Recent Labs  Lab 09/27/20 1900  INR 1.0   Cardiac Enzymes: No results for input(s): CKTOTAL, CKMB, CKMBINDEX, TROPONINI in the last 168 hours. BNP (last 3 results) No results for input(s): PROBNP in the last 8760 hours. HbA1C: Recent Labs    09/28/20 0403  HGBA1C 6.8*   CBG: Recent Labs  Lab 09/23/20 0703 09/23/20 1128 09/23/20 1555 09/24/20 0628 09/27/20 2142  GLUCAP 177* 176* 170* 138* 150*   Lipid Profile: Recent Labs     09/28/20 0404  CHOL 186  HDL 36*  LDLCALC 130*  TRIG 100  CHOLHDL 5.2   Thyroid Function Tests: No results for input(s): TSH, T4TOTAL, FREET4, T3FREE, THYROIDAB in the last 72 hours. Anemia Panel: No results for input(s): VITAMINB12, FOLATE, FERRITIN, TIBC, IRON, RETICCTPCT in the last 72 hours. Urine analysis: No results found for: COLORURINE, APPEARANCEUR, LABSPEC, PHURINE, GLUCOSEU, HGBUR, BILIRUBINUR, KETONESUR, PROTEINUR, UROBILINOGEN, NITRITE, LEUKOCYTESUR Sepsis Labs: @LABRCNTIP (procalcitonin:4,lacticidven:4) ) Recent Results (from the past 240 hour(s))  Resp Panel by RT-PCR (Flu A&B, Covid) Nasopharyngeal Swab     Status: None  Collection Time: 09/22/20  9:18 AM   Specimen: Nasopharyngeal Swab; Nasopharyngeal(NP) swabs in vial transport medium  Result Value Ref Range Status   SARS Coronavirus 2 by RT PCR NEGATIVE NEGATIVE Final    Comment: (NOTE) SARS-CoV-2 target nucleic acids are NOT DETECTED.  The SARS-CoV-2 RNA is generally detectable in upper respiratory specimens during the acute phase of infection. The lowest concentration of SARS-CoV-2 viral copies this assay can detect is 138 copies/mL. A negative result does not preclude SARS-Cov-2 infection and should not be used as the sole basis for treatment or other patient management decisions. A negative result may occur with  improper specimen collection/handling, submission of specimen other than nasopharyngeal swab, presence of viral mutation(s) within the areas targeted by this assay, and inadequate number of viral copies(<138 copies/mL). A negative result must be combined with clinical observations, patient history, and epidemiological information. The expected result is Negative.  Fact Sheet for Patients:  EntrepreneurPulse.com.au  Fact Sheet for Healthcare Providers:  IncredibleEmployment.be  This test is no t yet approved or cleared by the [place] FDA and  has  been authorized for detection and/or diagnosis of SARS-CoV-2 by FDA under an Emergency Use Authorization (EUA). This EUA will remain  in effect (meaning this test can be used) for the duration of the COVID-19 declaration under Section 564(b)(1) of the Act, 21 U.S.C.section 360bbb-3(b)(1), unless the authorization is terminated  or revoked sooner.       Influenza A by PCR NEGATIVE NEGATIVE Final   Influenza B by PCR NEGATIVE NEGATIVE Final    Comment: (NOTE) The Xpert Xpress SARS-CoV-2/FLU/RSV plus assay is intended as an aid in the diagnosis of influenza from Nasopharyngeal swab specimens and should not be used as a sole basis for treatment. Nasal washings and aspirates are unacceptable for Xpert Xpress SARS-CoV-2/FLU/RSV testing.  Fact Sheet for Patients: EntrepreneurPulse.com.au  Fact Sheet for Healthcare Providers: IncredibleEmployment.be  This test is not yet approved or cleared by the [place] FDA and has been authorized for detection and/or diagnosis of SARS-CoV-2 by FDA under an Emergency Use Authorization (EUA). This EUA will remain in effect (meaning this test can be used) for the duration of the COVID-19 declaration under Section 564(b)(1) of the Act, 21 U.S.C. section 360bbb-3(b)(1), unless the authorization is terminated or revoked.  Performed at [facility], [place] [address] [place], [place] [address]   MRSA Next Gen by PCR, Nasal     Status: None   Collection Time: 09/22/20 10:59 AM   Specimen: Nasal Mucosa; Nasal Swab  Result Value Ref Range Status   MRSA by PCR Next Gen NOT DETECTED NOT DETECTED Final    Comment: (NOTE) The GeneXpert MRSA Assay (FDA approved for NASAL specimens only), is one component of a comprehensive MRSA colonization surveillance program. It is not intended to diagnose MRSA infection nor to guide or monitor treatment for MRSA infections. Test performance is not FDA approved in patients less  than 12 years old. Performed at [facility], [place] [address], [place], [place] [address]   Resp Panel by RT-PCR (Flu A&B, Covid) Nasopharyngeal Swab     Status: None   Collection Time: 09/27/20  6:54 PM   Specimen: Nasopharyngeal Swab; Nasopharyngeal(NP) swabs in vial transport medium  Result Value Ref Range Status   SARS Coronavirus 2 by RT PCR NEGATIVE NEGATIVE Final    Comment: (NOTE) SARS-CoV-2 target nucleic acids are NOT DETECTED.  The SARS-CoV-2 RNA is generally detectable in upper respiratory specimens during the  acute phase of infection. The lowest concentration of SARS-CoV-2 viral copies this assay can detect is 138 copies/mL. A negative result does not preclude SARS-Cov-2 infection and should not be used as the sole basis for treatment or other patient management decisions. A negative result may occur with  improper specimen collection/handling, submission of specimen other than nasopharyngeal swab, presence of viral mutation(s) within the areas targeted by this assay, and inadequate number of viral copies(<138 copies/mL). A negative result must be combined with clinical observations, patient history, and epidemiological information. The expected result is Negative.  Fact Sheet for Patients:  EntrepreneurPulse.com.au  Fact Sheet for Healthcare Providers:  IncredibleEmployment.be  This test is no t yet approved or cleared by the [place] FDA and  has been authorized for detection and/or diagnosis of SARS-CoV-2 by FDA under an Emergency Use Authorization (EUA). This EUA will remain  in effect (meaning this test can be used) for the duration of the COVID-19 declaration under Section 564(b)(1) of the Act, 21 U.S.C.section 360bbb-3(b)(1), unless the authorization is terminated  or revoked sooner.       Influenza A by PCR NEGATIVE NEGATIVE Final   Influenza B by PCR NEGATIVE NEGATIVE Final    Comment: (NOTE) The Xpert  Xpress SARS-CoV-2/FLU/RSV plus assay is intended as an aid in the diagnosis of influenza from Nasopharyngeal swab specimens and should not be used as a sole basis for treatment. Nasal washings and aspirates are unacceptable for Xpert Xpress SARS-CoV-2/FLU/RSV testing.  Fact Sheet for Patients: EntrepreneurPulse.com.au  Fact Sheet for Healthcare Providers: IncredibleEmployment.be  This test is not yet approved or cleared by the [place] FDA and has been authorized for detection and/or diagnosis of SARS-CoV-2 by FDA under an Emergency Use Authorization (EUA). This EUA will remain in effect (meaning this test can be used) for the duration of the COVID-19 declaration under Section 564(b)(1) of the Act, 21 U.S.C. section 360bbb-3(b)(1), unless the authorization is terminated or revoked.  Performed at [facility], [place] [address], [place], [place] [address]          Radiology Studies: CT HEAD WO CONTRAST  Result Date: 09/27/2020 CLINICAL DATA:  Left-sided weakness. EXAM: CT HEAD WITHOUT CONTRAST TECHNIQUE: Contiguous axial images were obtained from the base of the skull through the vertex without intravenous contrast. COMPARISON:  None. FINDINGS: Brain: 18 x 16 x 17 mm focus of peripheral hypo attenuation is identified in posterior right parietooccipital region (image 15/series 2). No evidence for acute hemorrhage, hydrocephalus, or abnormal extra-axial fluid collection. Patchy low attenuation in the deep hemispheric and periventricular white matter is nonspecific, but likely reflects chronic microvascular ischemic demyelination. Vascular: No hyperdense vessel or unexpected calcification. Skull: No evidence for fracture. No worrisome lytic or sclerotic lesion. Sinuses/Orbits: The visualized paranasal sinuses and mastoid air cells are clear. Visualized portions of the globes and intraorbital fat are unremarkable. Other: None. IMPRESSION: 1. 18 x  16 x 17 mm focus of peripheral hypo attenuation in the posterior right parietooccipital region. This is nonspecific and could be related to subacute infarct, but given the somewhat rounded configuration, MRI of the brain without and with contrast recommended to further evaluate. 2. Chronic small vessel white matter ischemic disease. Electronically Signed   By: Misty Stanley M.D.   On: 09/27/2020 19:42   MR ANGIO HEAD WO CONTRAST  Result Date: 09/28/2020 CLINICAL DATA:  Initial evaluation for neuro deficit, stroke suspected, left-sided weakness. EXAM: MRI HEAD WITHOUT AND WITH CONTRAST MRA HEAD WITHOUT CONTRAST MRA NECK WITHOUT  AND WITH CONTRAST TECHNIQUE: Multiplanar, multi-echo pulse sequences of the brain and surrounding structures were acquired without intravenous contrast. Angiographic images of the Circle of Willis were acquired using MRA technique without intravenous contrast. Angiographic images of the neck were acquired using MRA technique without and with intravenous contrast. Carotid stenosis measurements (when applicable) are obtained utilizing NASCET criteria, using the distal internal carotid diameter as the denominator. CONTRAST:  46mL GADAVIST GADOBUTROL 1 MMOL/ML IV SOLN COMPARISON:  Prior CT from 09/27/2020. FINDINGS: MRI HEAD FINDINGS Brain: Examination moderately degraded by motion artifact. Cerebral volume within normal limits. Patchy T2/FLAIR hyperintensity seen involving the periventricular deep white matter of both cerebral hemispheres, with patchy involvement of the thalami and pons, most consistent with chronic microvascular ischemic disease, moderate in nature. Few scatter remote lacunar infarcts present at the left thalamus and pons. Multiple scattered foci of restricted diffusion seen involving the bilateral cerebral and cerebellar hemispheres, consistent with acute to early subacute ischemic infarcts. The largest infarct involving the left cerebral hemisphere seen involving the  cortical and subcortical posterior left frontal lobe in measures 1.4 cm (series 7, image 51). Largest infarct involving the right cerebral hemisphere involves the right occipital cortex and measures 2 cm (series 5, image 74). This accounts for the hypodensity seen on prior CT. Few small cerebellar infarcts measure up to 9 mm on the right. Patchy infarct measuring 1.2 cm seen involving the central/right paracentral medulla (series 5, image 62). No associated hemorrhage or mass effect. Minimal associated enhancement seen about the dominant right occipital infarct. No mass lesion, midline shift, or significant mass effect. No hydrocephalus or extra-axial fluid collection. Pituitary gland suprasellar region within normal limits. Midline structures intact. No other abnormal enhancement. Vascular: Major intracranial vascular flow voids are maintained. Skull and upper cervical spine: Craniocervical junction within normal limits. Bone marrow signal intensity normal. No scalp soft tissue abnormality. Sinuses/Orbits: Globes and orbital soft tissues demonstrate no acute finding. Mild scattered mucosal thickening noted within the ethmoidal air cells. Paranasal sinuses are otherwise clear. Trace bilateral mastoid effusions noted, of doubtful significance. Inner ear structures grossly normal. Other: None. MRA HEAD FINDINGS Anterior circulation: Examination degraded by motion artifact. Visualized distal cervical segments of the internal carotid arteries are patent with antegrade flow. Petrous segments patent bilaterally. Atheromatous irregularity throughout the carotid siphons bilaterally. No significant stenosis on the left. On the right, there is a probable moderate stenosis involving the proximal cavernous right ICA, better seen on corresponding MRA neck portion of this study (series 4, image 30). A1 segments patent bilaterally. Normal anterior communicating artery complex. Anterior cerebral arteries grossly patent to their  distal aspects. No visible M1 stenosis or occlusion. Normal MCA bifurcations. Distal MCA branches well perfused and fairly symmetric. Posterior circulation: Left vertebral artery dominant. Irregular moderate stenosis seen involving the distal left V4 segment just prior to the vertebrobasilar junction, better seen on MRA neck portion of this exam (series 2, image 54). Left PICA not visualized. Right vertebral artery diminutive occludes beyond the takeoff of the right PICA. Partially visualized proximal right PICA patent. Basilar patent proximally. Apparent short-segment severe stenosis involving the mid basilar artery on MIP reconstructions is not seen on corresponding time-of-flight sequence, favored to be artifactual. Mild to moderate stenosis noted at the basilar tip (series 2, image 44). Superior cerebellar arteries patent bilaterally. Right PCA primarily supplied via the basilar. Left PCA supplied via the basilar as well as a robust left posterior communicating artery. PCAs perfused to their distal aspects without definite high-grade stenosis.  No visible aneurysm on this motion degraded exam. Anatomic variants: Diminutive right vertebral artery largely terminates in PICA. Prominent left posterior communicating artery. MRA NECK FINDINGS Aortic arch: Examination moderately degraded by motion. Visualized aortic arch normal caliber with normal branch pattern. No hemodynamically significant stenosis seen about the origin of the great vessels. Right carotid system: Right CCA patent from its origin to the bifurcation without stenosis. Atheromatous irregularity seen about the right carotid bulb/proximal right ICA with no more than mild 20% stenosis by NASCET criteria. Right ICA patent distally without stenosis, evidence for dissection, or occlusion. Left carotid system: Left CCA patent from its origin to the bifurcation without stenosis. Atheromatous irregularity with up to 70% short-segment stenosis at the origin of  the left ICA (series 1060, image 3). Left ICA patent distally without additional stenosis, evidence for dissection or occlusion. Vertebral arteries: Both vertebral arteries arise from subclavian arteries. Vertebral arteries are patent without hemodynamically significant stenosis. No evidence for dissection or occlusion. Other: None IMPRESSION: MRI HEAD: 1. 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 Given the various vascular distributions involved, a central thromboembolic etiology is suspected. 2. Underlying moderate chronic microvascular ischemic disease with a few scattered remote lacunar infarcts involving the left thalamus and pons. MRA HEAD: 1. Technically limited exam due to motion artifact. 2. Negative intracranial MRA for large vessel occlusion. Intracranial atherosclerotic disease with associated moderate stenoses involving the distal left V4 segment, distal basilar artery, and cavernous right ICA as above. 3. 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 MRA NECK: 1. Technically limited exam due to motion artifact. 2. Approximate 70% atheromatous stenosis at the origin of the left ICA. 3. Otherwise negative MRA of the neck. No other hemodynamically significant or correctable stenosis identified. Electronically Signed   By: Jeannine Boga M.D.   On: 09/28/2020 03:18   MR ANGIO NECK W WO CONTRAST  Result Date: 09/28/2020 CLINICAL DATA:  Initial evaluation for neuro deficit, stroke suspected, left-sided weakness. EXAM: MRI HEAD WITHOUT AND WITH CONTRAST MRA HEAD WITHOUT CONTRAST MRA NECK WITHOUT AND WITH CONTRAST TECHNIQUE: Multiplanar, multi-echo pulse sequences of the brain and surrounding structures were acquired without intravenous contrast. Angiographic images of the Circle of Willis  were acquired using MRA technique without intravenous contrast. Angiographic images of the neck were acquired using MRA technique without and with intravenous contrast. Carotid stenosis measurements (when applicable) are obtained utilizing NASCET criteria, using the distal internal carotid diameter as the denominator. CONTRAST:  66mL GADAVIST GADOBUTROL 1 MMOL/ML IV SOLN COMPARISON:  Prior CT from 09/27/2020. FINDINGS: MRI HEAD FINDINGS Brain: Examination moderately degraded by motion artifact. Cerebral volume within normal limits. Patchy T2/FLAIR hyperintensity seen involving the periventricular deep white matter of both cerebral hemispheres, with patchy involvement of the thalami and pons, most consistent with chronic microvascular ischemic disease, moderate in nature. Few scatter remote lacunar infarcts present at the left thalamus and pons. Multiple scattered foci of restricted diffusion seen involving the bilateral cerebral and cerebellar hemispheres, consistent with acute to early subacute ischemic infarcts. The largest infarct involving the left cerebral hemisphere seen involving the cortical and subcortical posterior left frontal lobe in measures 1.4 cm (series 7, image 51). Largest infarct involving the right cerebral hemisphere involves the right occipital cortex and measures 2 cm (series 5, image 74). This accounts for the hypodensity seen  on prior CT. Few small cerebellar infarcts measure up to 9 mm on the right. Patchy infarct measuring 1.2 cm seen involving the central/right paracentral medulla (series 5, image 62). No associated hemorrhage or mass effect. Minimal associated enhancement seen about the dominant right occipital infarct. No mass lesion, midline shift, or significant mass effect. No hydrocephalus or extra-axial fluid collection. Pituitary gland suprasellar region within normal limits. Midline structures intact. No other abnormal enhancement. Vascular: Major intracranial vascular flow voids  are maintained. Skull and upper cervical spine: Craniocervical junction within normal limits. Bone marrow signal intensity normal. No scalp soft tissue abnormality. Sinuses/Orbits: Globes and orbital soft tissues demonstrate no acute finding. Mild scattered mucosal thickening noted within the ethmoidal air cells. Paranasal sinuses are otherwise clear. Trace bilateral mastoid effusions noted, of doubtful significance. Inner ear structures grossly normal. Other: None. MRA HEAD FINDINGS Anterior circulation: Examination degraded by motion artifact. Visualized distal cervical segments of the internal carotid arteries are patent with antegrade flow. Petrous segments patent bilaterally. Atheromatous irregularity throughout the carotid siphons bilaterally. No significant stenosis on the left. On the right, there is a probable moderate stenosis involving the proximal cavernous right ICA, better seen on corresponding MRA neck portion of this study (series 4, image 30). A1 segments patent bilaterally. Normal anterior communicating artery complex. Anterior cerebral arteries grossly patent to their distal aspects. No visible M1 stenosis or occlusion. Normal MCA bifurcations. Distal MCA branches well perfused and fairly symmetric. Posterior circulation: Left vertebral artery dominant. Irregular moderate stenosis seen involving the distal left V4 segment just prior to the vertebrobasilar junction, better seen on MRA neck portion of this exam (series 2, image 54). Left PICA not visualized. Right vertebral artery diminutive occludes beyond the takeoff of the right PICA. Partially visualized proximal right PICA patent. Basilar patent proximally. Apparent short-segment severe stenosis involving the mid basilar artery on MIP reconstructions is not seen on corresponding time-of-flight sequence, favored to be artifactual. Mild to moderate stenosis noted at the basilar tip (series 2, image 44). Superior cerebellar arteries patent  bilaterally. Right PCA primarily supplied via the basilar. Left PCA supplied via the basilar as well as a robust left posterior communicating artery. PCAs perfused to their distal aspects without definite high-grade stenosis. No visible aneurysm on this motion degraded exam. Anatomic variants: Diminutive right vertebral artery largely terminates in PICA. Prominent left posterior communicating artery. MRA NECK FINDINGS Aortic arch: Examination moderately degraded by motion. Visualized aortic arch normal caliber with normal branch pattern. No hemodynamically significant stenosis seen about the origin of the great vessels. Right carotid system: Right CCA patent from its origin to the bifurcation without stenosis. Atheromatous irregularity seen about the right carotid bulb/proximal right ICA with no more than mild 20% stenosis by NASCET criteria. Right ICA patent distally without stenosis, evidence for dissection, or occlusion. Left carotid system: Left CCA patent from its origin to the bifurcation without stenosis. Atheromatous irregularity with up to 70% short-segment stenosis at the origin of the left ICA (series 1060, image 3). Left ICA patent distally without additional stenosis, evidence for dissection or occlusion. Vertebral arteries: Both vertebral arteries arise from subclavian arteries. Vertebral arteries are patent without hemodynamically significant stenosis. No evidence for dissection or occlusion. Other: None IMPRESSION: MRI HEAD: 1. 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 Given the various vascular distributions involved, a central thromboembolic etiology is  suspected. 2. Underlying moderate chronic microvascular ischemic disease with a few scattered remote lacunar infarcts involving the left thalamus and pons. MRA  HEAD: 1. Technically limited exam due to motion artifact. 2. Negative intracranial MRA for large vessel occlusion. Intracranial atherosclerotic disease with associated moderate stenoses involving the distal left V4 segment, distal basilar artery, and cavernous right ICA as above. 3. 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 MRA NECK: 1. Technically limited exam due to motion artifact. 2. Approximate 70% atheromatous stenosis at the origin of the left ICA. 3. Otherwise negative MRA of the neck. No other hemodynamically significant or correctable stenosis identified. Electronically Signed   By: Jeannine Boga M.D.   On: 09/28/2020 03:18   MR Brain W and Wo Contrast  Result Date: 09/28/2020 CLINICAL DATA:  Initial evaluation for neuro deficit, stroke suspected, left-sided weakness. EXAM: MRI HEAD WITHOUT AND WITH CONTRAST MRA HEAD WITHOUT CONTRAST MRA NECK WITHOUT AND WITH CONTRAST TECHNIQUE: Multiplanar, multi-echo pulse sequences of the brain and surrounding structures were acquired without intravenous contrast. Angiographic images of the Circle of Willis were acquired using MRA technique without intravenous contrast. Angiographic images of the neck were acquired using MRA technique without and with intravenous contrast. Carotid stenosis measurements (when applicable) are obtained utilizing NASCET criteria, using the distal internal carotid diameter as the denominator. CONTRAST:  6mL GADAVIST GADOBUTROL 1 MMOL/ML IV SOLN COMPARISON:  Prior CT from 09/27/2020. FINDINGS: MRI HEAD FINDINGS Brain: Examination moderately degraded by motion artifact. Cerebral volume within normal limits. Patchy T2/FLAIR hyperintensity seen involving the periventricular deep white matter of both cerebral hemispheres, with patchy involvement of the thalami and pons, most consistent with chronic microvascular ischemic disease, moderate in nature. Few scatter remote lacunar infarcts present at the left  thalamus and pons. Multiple scattered foci of restricted diffusion seen involving the bilateral cerebral and cerebellar hemispheres, consistent with acute to early subacute ischemic infarcts. The largest infarct involving the left cerebral hemisphere seen involving the cortical and subcortical posterior left frontal lobe in measures 1.4 cm (series 7, image 51). Largest infarct involving the right cerebral hemisphere involves the right occipital cortex and measures 2 cm (series 5, image 74). This accounts for the hypodensity seen on prior CT. Few small cerebellar infarcts measure up to 9 mm on the right. Patchy infarct measuring 1.2 cm seen involving the central/right paracentral medulla (series 5, image 62). No associated hemorrhage or mass effect. Minimal associated enhancement seen about the dominant right occipital infarct. No mass lesion, midline shift, or significant mass effect. No hydrocephalus or extra-axial fluid collection. Pituitary gland suprasellar region within normal limits. Midline structures intact. No other abnormal enhancement. Vascular: Major intracranial vascular flow voids are maintained. Skull and upper cervical spine: Craniocervical junction within normal limits. Bone marrow signal intensity normal. No scalp soft tissue abnormality. Sinuses/Orbits: Globes and orbital soft tissues demonstrate no acute finding. Mild scattered mucosal thickening noted within the ethmoidal air cells. Paranasal sinuses are otherwise clear. Trace bilateral mastoid effusions noted, of doubtful significance. Inner ear structures grossly normal. Other: None. MRA HEAD FINDINGS Anterior circulation: Examination degraded by motion artifact. Visualized distal cervical segments of the internal carotid arteries are patent with antegrade flow. Petrous segments patent bilaterally. Atheromatous irregularity throughout the carotid siphons bilaterally. No significant stenosis on the left. On the right, there is a probable  moderate stenosis involving the proximal cavernous right ICA, better seen on corresponding MRA neck portion of this study (series 4, image 30). A1 segments patent  bilaterally. Normal anterior communicating artery complex. Anterior cerebral arteries grossly patent to their distal aspects. No visible M1 stenosis or occlusion. Normal MCA bifurcations. Distal MCA branches well perfused and fairly symmetric. Posterior circulation: Left vertebral artery dominant. Irregular moderate stenosis seen involving the distal left V4 segment just prior to the vertebrobasilar junction, better seen on MRA neck portion of this exam (series 2, image 54). Left PICA not visualized. Right vertebral artery diminutive occludes beyond the takeoff of the right PICA. Partially visualized proximal right PICA patent. Basilar patent proximally. Apparent short-segment severe stenosis involving the mid basilar artery on MIP reconstructions is not seen on corresponding time-of-flight sequence, favored to be artifactual. Mild to moderate stenosis noted at the basilar tip (series 2, image 44). Superior cerebellar arteries patent bilaterally. Right PCA primarily supplied via the basilar. Left PCA supplied via the basilar as well as a robust left posterior communicating artery. PCAs perfused to their distal aspects without definite high-grade stenosis. No visible aneurysm on this motion degraded exam. Anatomic variants: Diminutive right vertebral artery largely terminates in PICA. Prominent left posterior communicating artery. MRA NECK FINDINGS Aortic arch: Examination moderately degraded by motion. Visualized aortic arch normal caliber with normal branch pattern. No hemodynamically significant stenosis seen about the origin of the great vessels. Right carotid system: Right CCA patent from its origin to the bifurcation without stenosis. Atheromatous irregularity seen about the right carotid bulb/proximal right ICA with no more than mild 20% stenosis by  NASCET criteria. Right ICA patent distally without stenosis, evidence for dissection, or occlusion. Left carotid system: Left CCA patent from its origin to the bifurcation without stenosis. Atheromatous irregularity with up to 70% short-segment stenosis at the origin of the left ICA (series 1060, image 3). Left ICA patent distally without additional stenosis, evidence for dissection or occlusion. Vertebral arteries: Both vertebral arteries arise from subclavian arteries. Vertebral arteries are patent without hemodynamically significant stenosis. No evidence for dissection or occlusion. Other: None IMPRESSION: MRI HEAD: 1. 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 Given the various vascular distributions involved, a central thromboembolic etiology is suspected. 2. Underlying moderate chronic microvascular ischemic disease with a few scattered remote lacunar infarcts involving the left thalamus and pons. MRA HEAD: 1. Technically limited exam due to motion artifact. 2. Negative intracranial MRA for large vessel occlusion. Intracranial atherosclerotic disease with associated moderate stenoses involving the distal left V4 segment, distal basilar artery, and cavernous right ICA as above. 3. 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 MRA NECK: 1. Technically limited exam due to motion artifact. 2. Approximate 70% atheromatous stenosis at the origin of the left ICA. 3. Otherwise negative MRA of the neck. No other hemodynamically significant or correctable stenosis identified. Electronically Signed   By: Jeannine Boga M.D.   On: 09/28/2020 03:18   DG Chest Portable 1 View  Result Date: 09/27/2020 CLINICAL DATA:  Code STEMI.  Shortness of breath and recent MI. EXAM: PORTABLE CHEST 1 VIEW COMPARISON:   09/23/2020 FINDINGS: The heart size and mediastinal contours are within normal limits. Both lungs are clear. The visualized skeletal structures are unremarkable. IMPRESSION: No active disease. Electronically Signed   By: Lucienne Capers M.D.   On: 09/27/2020 20:02      Scheduled Meds:   stroke: mapping our early stages of recovery book   Does  not apply Once   aspirin EC  81 mg Oral Daily   carvedilol  12.5 mg Oral BID WC   enoxaparin (LOVENOX) injection  40 mg Subcutaneous Q24H   insulin aspart  0-5 Units Subcutaneous QHS   insulin aspart  0-9 Units Subcutaneous TID WC   rosuvastatin  40 mg Oral Daily   sacubitril-valsartan  1 tablet Oral BID   spironolactone  25 mg Oral Daily   ticagrelor  90 mg Oral BID   Continuous Infusions:   LOS: 1 day      Debbe Odea, MD [facility] Pager: [email or web address] 09/28/2020, 8:01 AM

## 2020-09-28 NOTE — Telephone Encounter (Signed)
Per phase I cardiac rehab, fax cardiac rehab referral to [facility]. °

## 2020-09-28 NOTE — Progress Notes (Signed)
Rehab Admissions Coordinator Note:  Patient was screened by Cleatrice Burke for appropriateness for an Inpatient Acute Rehab Consult per therapy recs. At this time, we are recommending Inpatient Rehab consult. I will place order per protocol.  Cleatrice Burke RN MSN 09/28/2020, 5:42 PM  I can be reached at [phone].

## 2020-09-28 NOTE — Progress Notes (Addendum)
STROKE TEAM PROGRESS NOTE   INTERVAL HISTORY Patient seen in ED. Patient sitting in dark room, able to follow commands, NAD  Vitals:   09/28/20 0215 09/28/20 0415 09/28/20 0600 09/28/20 0730  BP: 112/75 111/63 139/78 (!) 144/82  Pulse: 60 69 62 70  Resp: 11 20  18   Temp:    98.2 F (36.8 C)  TempSrc:    Oral  SpO2: 95% 99% 96% 93%  Weight:      Height:       CBC:  Recent Labs  Lab 09/24/20 0317 09/27/20 1900 09/27/20 1950  WBC 10.3 6.9  --   NEUTROABS  --  4.9  --   HGB 13.1 12.1 12.2  HCT 38.5 36.4 36.0  MCV 91.7 94.3  --   PLT 287 352  --    Basic Metabolic Panel:  Recent Labs  Lab 09/24/20 0317 09/27/20 1900 09/27/20 1950  NA 133* 134* 138  K 3.4* 3.8 3.8  CL 104 104 108  CO2 21* 18*  --   GLUCOSE 142* 205* 201*  BUN 27* 26* 26*  CREATININE 1.09* 1.30* 1.20*  CALCIUM 9.3 9.7  --    Lipid Panel:  Recent Labs  Lab 09/28/20 0404  CHOL 186  TRIG 100  HDL 36*  CHOLHDL 5.2  VLDL 20  LDLCALC 130*   HgbA1c:  Recent Labs  Lab 09/28/20 0403  HGBA1C 6.8*    Alcohol Level  Recent Labs  Lab 09/27/20 1938  ETH <10    IMAGING past 24 hours CT HEAD WO CONTRAST  Result Date: 09/27/2020 CLINICAL DATA:  Left-sided weakness. EXAM: CT HEAD WITHOUT CONTRAST TECHNIQUE: Contiguous axial images were obtained from the base of the skull through the vertex without intravenous contrast. COMPARISON:  None. FINDINGS: Brain: 18 x 16 x 17 mm focus of peripheral hypo attenuation is identified in posterior right parietooccipital region (image 15/series 2). No evidence for acute hemorrhage, hydrocephalus, or abnormal extra-axial fluid collection. Patchy low attenuation in the deep hemispheric and periventricular white matter is nonspecific, but likely reflects chronic microvascular ischemic demyelination. Vascular: No hyperdense vessel or unexpected calcification. Skull: No evidence for fracture. No worrisome lytic or sclerotic lesion. Sinuses/Orbits: The visualized paranasal  sinuses and mastoid air cells are clear. Visualized portions of the globes and intraorbital fat are unremarkable. Other: None. IMPRESSION: 1. 18 x 16 x 17 mm focus of peripheral hypo attenuation in the posterior right parietooccipital region. This is nonspecific and could be related to subacute infarct, but given the somewhat rounded configuration, MRI of the brain without and with contrast recommended to further evaluate. 2. Chronic small vessel white matter ischemic disease. Electronically Signed   By: Misty Stanley M.D.   On: 09/27/2020 19:42   MR ANGIO HEAD WO CONTRAST  Result Date: 09/28/2020 CLINICAL DATA:  Initial evaluation for neuro deficit, stroke suspected, left-sided weakness. EXAM: MRI HEAD WITHOUT AND WITH CONTRAST MRA HEAD WITHOUT CONTRAST MRA NECK WITHOUT AND WITH CONTRAST TECHNIQUE: Multiplanar, multi-echo pulse sequences of the brain and surrounding structures were acquired without intravenous contrast. Angiographic images of the Circle of Willis were acquired using MRA technique without intravenous contrast. Angiographic images of the neck were acquired using MRA technique without and with intravenous contrast. Carotid stenosis measurements (when applicable) are obtained utilizing NASCET criteria, using the distal internal carotid diameter as the denominator. CONTRAST:  41mL GADAVIST GADOBUTROL 1 MMOL/ML IV SOLN COMPARISON:  Prior CT from 09/27/2020. FINDINGS: MRI HEAD FINDINGS Brain: Examination moderately degraded by motion artifact. Cerebral  volume within normal limits. Patchy T2/FLAIR hyperintensity seen involving the periventricular deep white matter of both cerebral hemispheres, with patchy involvement of the thalami and pons, most consistent with chronic microvascular ischemic disease, moderate in nature. Few scatter remote lacunar infarcts present at the left thalamus and pons. Multiple scattered foci of restricted diffusion seen involving the bilateral cerebral and cerebellar  hemispheres, consistent with acute to early subacute ischemic infarcts. The largest infarct involving the left cerebral hemisphere seen involving the cortical and subcortical posterior left frontal lobe in measures 1.4 cm (series 7, image 51). Largest infarct involving the right cerebral hemisphere involves the right occipital cortex and measures 2 cm (series 5, image 74). This accounts for the hypodensity seen on prior CT. Few small cerebellar infarcts measure up to 9 mm on the right. Patchy infarct measuring 1.2 cm seen involving the central/right paracentral medulla (series 5, image 62). No associated hemorrhage or mass effect. Minimal associated enhancement seen about the dominant right occipital infarct. No mass lesion, midline shift, or significant mass effect. No hydrocephalus or extra-axial fluid collection. Pituitary gland suprasellar region within normal limits. Midline structures intact. No other abnormal enhancement. Vascular: Major intracranial vascular flow voids are maintained. Skull and upper cervical spine: Craniocervical junction within normal limits. Bone marrow signal intensity normal. No scalp soft tissue abnormality. Sinuses/Orbits: Globes and orbital soft tissues demonstrate no acute finding. Mild scattered mucosal thickening noted within the ethmoidal air cells. Paranasal sinuses are otherwise clear. Trace bilateral mastoid effusions noted, of doubtful significance. Inner ear structures grossly normal. Other: None. MRA HEAD FINDINGS Anterior circulation: Examination degraded by motion artifact. Visualized distal cervical segments of the internal carotid arteries are patent with antegrade flow. Petrous segments patent bilaterally. Atheromatous irregularity throughout the carotid siphons bilaterally. No significant stenosis on the left. On the right, there is a probable moderate stenosis involving the proximal cavernous right ICA, better seen on corresponding MRA neck portion of this study  (series 4, image 30). A1 segments patent bilaterally. Normal anterior communicating artery complex. Anterior cerebral arteries grossly patent to their distal aspects. No visible M1 stenosis or occlusion. Normal MCA bifurcations. Distal MCA branches well perfused and fairly symmetric. Posterior circulation: Left vertebral artery dominant. Irregular moderate stenosis seen involving the distal left V4 segment just prior to the vertebrobasilar junction, better seen on MRA neck portion of this exam (series 2, image 54). Left PICA not visualized. Right vertebral artery diminutive occludes beyond the takeoff of the right PICA. Partially visualized proximal right PICA patent. Basilar patent proximally. Apparent short-segment severe stenosis involving the mid basilar artery on MIP reconstructions is not seen on corresponding time-of-flight sequence, favored to be artifactual. Mild to moderate stenosis noted at the basilar tip (series 2, image 44). Superior cerebellar arteries patent bilaterally. Right PCA primarily supplied via the basilar. Left PCA supplied via the basilar as well as a robust left posterior communicating artery. PCAs perfused to their distal aspects without definite high-grade stenosis. No visible aneurysm on this motion degraded exam. Anatomic variants: Diminutive right vertebral artery largely terminates in PICA. Prominent left posterior communicating artery. MRA NECK FINDINGS Aortic arch: Examination moderately degraded by motion. Visualized aortic arch normal caliber with normal branch pattern. No hemodynamically significant stenosis seen about the origin of the great vessels. Right carotid system: Right CCA patent from its origin to the bifurcation without stenosis. Atheromatous irregularity seen about the right carotid bulb/proximal right ICA with no more than mild 20% stenosis by NASCET criteria. Right ICA patent distally without stenosis, evidence  for dissection, or occlusion. Left carotid system:  Left CCA patent from its origin to the bifurcation without stenosis. Atheromatous irregularity with up to 70% short-segment stenosis at the origin of the left ICA (series 1060, image 3). Left ICA patent distally without additional stenosis, evidence for dissection or occlusion. Vertebral arteries: Both vertebral arteries arise from subclavian arteries. Vertebral arteries are patent without hemodynamically significant stenosis. No evidence for dissection or occlusion. Other: None IMPRESSION: MRI HEAD: 1. 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 Given the various vascular distributions involved, a central thromboembolic etiology is suspected. 2. Underlying moderate chronic microvascular ischemic disease with a few scattered remote lacunar infarcts involving the left thalamus and pons. MRA HEAD: 1. Technically limited exam due to motion artifact. 2. Negative intracranial MRA for large vessel occlusion. Intracranial atherosclerotic disease with associated moderate stenoses involving the distal left V4 segment, distal basilar artery, and cavernous right ICA as above. 3. 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 MRA NECK: 1. Technically limited exam due to motion artifact. 2. Approximate 70% atheromatous stenosis at the origin of the left ICA. 3. Otherwise negative MRA of the neck. No other hemodynamically significant or correctable stenosis identified. Electronically Signed   By: Jeannine Boga M.D.   On: 09/28/2020 03:18   MR ANGIO NECK W WO CONTRAST  Result Date: 09/28/2020 CLINICAL DATA:  Initial evaluation for neuro deficit, stroke suspected, left-sided weakness. EXAM: MRI HEAD WITHOUT AND WITH CONTRAST MRA HEAD WITHOUT CONTRAST MRA NECK WITHOUT AND WITH CONTRAST TECHNIQUE: Multiplanar,  multi-echo pulse sequences of the brain and surrounding structures were acquired without intravenous contrast. Angiographic images of the Circle of Willis were acquired using MRA technique without intravenous contrast. Angiographic images of the neck were acquired using MRA technique without and with intravenous contrast. Carotid stenosis measurements (when applicable) are obtained utilizing NASCET criteria, using the distal internal carotid diameter as the denominator. CONTRAST:  103mL GADAVIST GADOBUTROL 1 MMOL/ML IV SOLN COMPARISON:  Prior CT from 09/27/2020. FINDINGS: MRI HEAD FINDINGS Brain: Examination moderately degraded by motion artifact. Cerebral volume within normal limits. Patchy T2/FLAIR hyperintensity seen involving the periventricular deep white matter of both cerebral hemispheres, with patchy involvement of the thalami and pons, most consistent with chronic microvascular ischemic disease, moderate in nature. Few scatter remote lacunar infarcts present at the left thalamus and pons. Multiple scattered foci of restricted diffusion seen involving the bilateral cerebral and cerebellar hemispheres, consistent with acute to early subacute ischemic infarcts. The largest infarct involving the left cerebral hemisphere seen involving the cortical and subcortical posterior left frontal lobe in measures 1.4 cm (series 7, image 51). Largest infarct involving the right cerebral hemisphere involves the right occipital cortex and measures 2 cm (series 5, image 74). This accounts for the hypodensity seen on prior CT. Few small cerebellar infarcts measure up to 9 mm on the right. Patchy infarct measuring 1.2 cm seen involving the central/right paracentral medulla (series 5, image 62). No associated hemorrhage or mass effect. Minimal associated enhancement seen about the dominant right occipital infarct. No mass lesion, midline shift, or significant mass effect. No hydrocephalus or extra-axial fluid collection. Pituitary  gland suprasellar region within normal limits. Midline structures intact. No other abnormal enhancement. Vascular: Major intracranial vascular flow voids are maintained. Skull and upper cervical spine: Craniocervical junction within normal limits. Bone  marrow signal intensity normal. No scalp soft tissue abnormality. Sinuses/Orbits: Globes and orbital soft tissues demonstrate no acute finding. Mild scattered mucosal thickening noted within the ethmoidal air cells. Paranasal sinuses are otherwise clear. Trace bilateral mastoid effusions noted, of doubtful significance. Inner ear structures grossly normal. Other: None. MRA HEAD FINDINGS Anterior circulation: Examination degraded by motion artifact. Visualized distal cervical segments of the internal carotid arteries are patent with antegrade flow. Petrous segments patent bilaterally. Atheromatous irregularity throughout the carotid siphons bilaterally. No significant stenosis on the left. On the right, there is a probable moderate stenosis involving the proximal cavernous right ICA, better seen on corresponding MRA neck portion of this study (series 4, image 30). A1 segments patent bilaterally. Normal anterior communicating artery complex. Anterior cerebral arteries grossly patent to their distal aspects. No visible M1 stenosis or occlusion. Normal MCA bifurcations. Distal MCA branches well perfused and fairly symmetric. Posterior circulation: Left vertebral artery dominant. Irregular moderate stenosis seen involving the distal left V4 segment just prior to the vertebrobasilar junction, better seen on MRA neck portion of this exam (series 2, image 54). Left PICA not visualized. Right vertebral artery diminutive occludes beyond the takeoff of the right PICA. Partially visualized proximal right PICA patent. Basilar patent proximally. Apparent short-segment severe stenosis involving the mid basilar artery on MIP reconstructions is not seen on corresponding time-of-flight  sequence, favored to be artifactual. Mild to moderate stenosis noted at the basilar tip (series 2, image 44). Superior cerebellar arteries patent bilaterally. Right PCA primarily supplied via the basilar. Left PCA supplied via the basilar as well as a robust left posterior communicating artery. PCAs perfused to their distal aspects without definite high-grade stenosis. No visible aneurysm on this motion degraded exam. Anatomic variants: Diminutive right vertebral artery largely terminates in PICA. Prominent left posterior communicating artery. MRA NECK FINDINGS Aortic arch: Examination moderately degraded by motion. Visualized aortic arch normal caliber with normal branch pattern. No hemodynamically significant stenosis seen about the origin of the great vessels. Right carotid system: Right CCA patent from its origin to the bifurcation without stenosis. Atheromatous irregularity seen about the right carotid bulb/proximal right ICA with no more than mild 20% stenosis by NASCET criteria. Right ICA patent distally without stenosis, evidence for dissection, or occlusion. Left carotid system: Left CCA patent from its origin to the bifurcation without stenosis. Atheromatous irregularity with up to 70% short-segment stenosis at the origin of the left ICA (series 1060, image 3). Left ICA patent distally without additional stenosis, evidence for dissection or occlusion. Vertebral arteries: Both vertebral arteries arise from subclavian arteries. Vertebral arteries are patent without hemodynamically significant stenosis. No evidence for dissection or occlusion. Other: None IMPRESSION: MRI HEAD: 1. 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 Given the various vascular distributions involved, a central thromboembolic etiology is suspected. 2.  Underlying moderate chronic microvascular ischemic disease with a few scattered remote lacunar infarcts involving the left thalamus and pons. MRA HEAD: 1. Technically limited exam due to motion artifact. 2. Negative intracranial MRA for large vessel occlusion. Intracranial atherosclerotic disease with associated moderate stenoses involving the distal left V4 segment, distal basilar artery, and cavernous right ICA as above. 3. 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 MRA NECK: 1. Technically limited exam due to motion artifact. 2. Approximate 70% atheromatous stenosis at the origin of  the left ICA. 3. Otherwise negative MRA of the neck. No other hemodynamically significant or correctable stenosis identified. Electronically Signed   By: Jeannine Boga M.D.   On: 09/28/2020 03:18   MR Brain W and Wo Contrast  Result Date: 09/28/2020 CLINICAL DATA:  Initial evaluation for neuro deficit, stroke suspected, left-sided weakness. EXAM: MRI HEAD WITHOUT AND WITH CONTRAST MRA HEAD WITHOUT CONTRAST MRA NECK WITHOUT AND WITH CONTRAST TECHNIQUE: Multiplanar, multi-echo pulse sequences of the brain and surrounding structures were acquired without intravenous contrast. Angiographic images of the Circle of Willis were acquired using MRA technique without intravenous contrast. Angiographic images of the neck were acquired using MRA technique without and with intravenous contrast. Carotid stenosis measurements (when applicable) are obtained utilizing NASCET criteria, using the distal internal carotid diameter as the denominator. CONTRAST:  61mL GADAVIST GADOBUTROL 1 MMOL/ML IV SOLN COMPARISON:  Prior CT from 09/27/2020. FINDINGS: MRI HEAD FINDINGS Brain: Examination moderately degraded by motion artifact. Cerebral volume within normal limits. Patchy T2/FLAIR hyperintensity seen involving the periventricular deep white matter of both cerebral hemispheres, with patchy involvement of the thalami and  pons, most consistent with chronic microvascular ischemic disease, moderate in nature. Few scatter remote lacunar infarcts present at the left thalamus and pons. Multiple scattered foci of restricted diffusion seen involving the bilateral cerebral and cerebellar hemispheres, consistent with acute to early subacute ischemic infarcts. The largest infarct involving the left cerebral hemisphere seen involving the cortical and subcortical posterior left frontal lobe in measures 1.4 cm (series 7, image 51). Largest infarct involving the right cerebral hemisphere involves the right occipital cortex and measures 2 cm (series 5, image 74). This accounts for the hypodensity seen on prior CT. Few small cerebellar infarcts measure up to 9 mm on the right. Patchy infarct measuring 1.2 cm seen involving the central/right paracentral medulla (series 5, image 62). No associated hemorrhage or mass effect. Minimal associated enhancement seen about the dominant right occipital infarct. No mass lesion, midline shift, or significant mass effect. No hydrocephalus or extra-axial fluid collection. Pituitary gland suprasellar region within normal limits. Midline structures intact. No other abnormal enhancement. Vascular: Major intracranial vascular flow voids are maintained. Skull and upper cervical spine: Craniocervical junction within normal limits. Bone marrow signal intensity normal. No scalp soft tissue abnormality. Sinuses/Orbits: Globes and orbital soft tissues demonstrate no acute finding. Mild scattered mucosal thickening noted within the ethmoidal air cells. Paranasal sinuses are otherwise clear. Trace bilateral mastoid effusions noted, of doubtful significance. Inner ear structures grossly normal. Other: None. MRA HEAD FINDINGS Anterior circulation: Examination degraded by motion artifact. Visualized distal cervical segments of the internal carotid arteries are patent with antegrade flow. Petrous segments patent bilaterally.  Atheromatous irregularity throughout the carotid siphons bilaterally. No significant stenosis on the left. On the right, there is a probable moderate stenosis involving the proximal cavernous right ICA, better seen on corresponding MRA neck portion of this study (series 4, image 30). A1 segments patent bilaterally. Normal anterior communicating artery complex. Anterior cerebral arteries grossly patent to their distal aspects. No visible M1 stenosis or occlusion. Normal MCA bifurcations. Distal MCA branches well perfused and fairly symmetric. Posterior circulation: Left vertebral artery dominant. Irregular moderate stenosis seen involving the distal left V4 segment just prior to the vertebrobasilar junction, better seen on MRA neck portion of this exam (series 2, image 54). Left PICA not visualized. Right vertebral artery diminutive occludes beyond the takeoff of the right PICA. Partially visualized proximal right PICA patent. Basilar patent proximally. Apparent short-segment severe  stenosis involving the mid basilar artery on MIP reconstructions is not seen on corresponding time-of-flight sequence, favored to be artifactual. Mild to moderate stenosis noted at the basilar tip (series 2, image 44). Superior cerebellar arteries patent bilaterally. Right PCA primarily supplied via the basilar. Left PCA supplied via the basilar as well as a robust left posterior communicating artery. PCAs perfused to their distal aspects without definite high-grade stenosis. No visible aneurysm on this motion degraded exam. Anatomic variants: Diminutive right vertebral artery largely terminates in PICA. Prominent left posterior communicating artery. MRA NECK FINDINGS Aortic arch: Examination moderately degraded by motion. Visualized aortic arch normal caliber with normal branch pattern. No hemodynamically significant stenosis seen about the origin of the great vessels. Right carotid system: Right CCA patent from its origin to the  bifurcation without stenosis. Atheromatous irregularity seen about the right carotid bulb/proximal right ICA with no more than mild 20% stenosis by NASCET criteria. Right ICA patent distally without stenosis, evidence for dissection, or occlusion. Left carotid system: Left CCA patent from its origin to the bifurcation without stenosis. Atheromatous irregularity with up to 70% short-segment stenosis at the origin of the left ICA (series 1060, image 3). Left ICA patent distally without additional stenosis, evidence for dissection or occlusion. Vertebral arteries: Both vertebral arteries arise from subclavian arteries. Vertebral arteries are patent without hemodynamically significant stenosis. No evidence for dissection or occlusion. Other: None IMPRESSION: MRI HEAD: 1. 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 Given the various vascular distributions involved, a central thromboembolic etiology is suspected. 2. Underlying moderate chronic microvascular ischemic disease with a few scattered remote lacunar infarcts involving the left thalamus and pons. MRA HEAD: 1. Technically limited exam due to motion artifact. 2. Negative intracranial MRA for large vessel occlusion. Intracranial atherosclerotic disease with associated moderate stenoses involving the distal left V4 segment, distal basilar artery, and cavernous right ICA as above. 3. 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 MRA NECK: 1. Technically limited exam due to motion artifact. 2. Approximate 70% atheromatous stenosis at the origin of the left ICA. 3. Otherwise negative MRA of the neck. No other hemodynamically significant or correctable stenosis identified. Electronically Signed   By: Jeannine Boga M.D.   On: 09/28/2020 03:18   DG Chest  Portable 1 View  Result Date: 09/27/2020 CLINICAL DATA:  Code STEMI.  Shortness of breath and recent MI. EXAM: PORTABLE CHEST 1 VIEW COMPARISON:  09/23/2020 FINDINGS: The heart size and mediastinal contours are within normal limits. Both lungs are clear. The visualized skeletal structures are unremarkable. IMPRESSION: No active disease. Electronically Signed   By: Lucienne Capers M.D.   On: 09/27/2020 20:02   ABORTED INVASIVE LAB PROCEDURE  Result Date: 09/28/2020 This case was aborted.   PHYSICAL EXAM Physical Exam  Constitutional: Appears well-developed and well-nourished.  Psych: Affect appropriate to situation Eyes: Normal external eye and conjunctiva. HENT: Normocephalic, no lesions, without obvious abnormality.missing dentition   Musculoskeletal-no joint tenderness, deformity or swelling Cardiovascular: Normal rate and regular rhythm.  Respiratory: Effort normal, non-labored breathing saturations WNL GI: Soft.  No distension. There is no tenderness.  Skin: WDI  Neurological Examination Mental Status: Alert, oriented, thought content appropriate.  Speech fluent without evidence of aphasia.  Able to follow 3 step commands without difficulty. Cranial Nerves: II:  Visual fields grossly normal,  III,IV, VI: ptosis not present, extra-ocular motions intact bilaterally, pupils equal, round, reactive to light and accommodation V,VII: smile symmetric, facial light touch sensation normal bilaterally VIII: hearing normal bilaterally IX,X: uvula rises midline XI: bilateral shoulder shrug XII: midline tongue extension Motor: Right : Upper extremity   5/5  Left:     Upper extremity   1/5  Lower extremity   5/5   Lower extremity   1/5 Tone and bulk:normal tone throughout; no atrophy noted Sensory: light touch intact throughout, bilaterally Deep Tendon Reflexes: 2+ and symmetric biceps and patella Plantars: Right: downgoing   Left: downgoing Cerebellar: normal finger-to-nose, normal rapid  alternating movements and normal heel-to-shin test Gait: deferred  ASSESSMENT/PLAN Tracey Meyer is a 60 y.o. female with PMH significant for DM2, tobacco use and trying to quit, HLD, CAD s/p angio and stenting of mLAD a few days ago who presents with L sided weakness that started on 09/24/20 after discharge. She has also been having shortness of breath and it gets worse with lying flat.  Stroke:  scattered infarct likely cardioembolic secondary to recent stent placement mLAD.  CT head :No acute abnormality. 8 x 16 x 17 mm focus of peripheral hypo attenuation in the posterior right parietooccipital region.  MRI  1. 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 Given the various vascular distributions involved, a central thromboembolic etiology is suspected. 2. Underlying moderate chronic microvascular ischemic disease with a few scattered remote lacunar infarcts involving the left thalamus and pons. MRA Intracranial atherosclerotic disease with associated moderate stenoses involving the distal left V4 segment, distal basilar artery, and cavernous right ICA as above.Occlusion of the right V4 segment beyond the takeoff of the right PICA.  MRA NECK: Approximate 70% atheromatous stenosis at the origin of the left ICA. 2D Echo: pending, 35-40% on 09/22/20 Agree with cardiology for TEE if TTE unrevealing LDL 130 HgbA1c 6.8 VTE prophylaxis - Lovenox aspirin 81 mg daily and Brilinta (ticagrelor) 90 mg bid prior to admission, now on aspirin 81 mg daily and Brilinta (ticagrelor) 90 mg bid.  Continue ASA and Brillinta per cardiology recommendations. Therapy recommendations:  pending Disposition:  pending  Recent STEMI s/p stent anterior STEMI 09/21/20 s/p LHC with PCI/DES to LAD.  TTE EF 35-40% with anterior hypokinesis but  no apical thrombus at that time. On Aspirin and brilinta and statin Cardiology on board  Cardiomyopathy  Cardiology on board EF 35-40% on 09/22/20 Current TTE repeat pending On coreg, entresto, spironolactone at home  Hypertension Home meds:  coreg, entresto, spironolactone Stable Gradually normalize in 3-5 days Long-term BP goal normotensive  Hyperlipidemia Home meds:  crestor 10mg , not resumed in hospital LDL 130, goal < 70 Increase crestor to 40 mg Continue statin at discharge  Diabetes type II Controlled Home meds:  metformin HgbA1c 6.8, goal < 7.0 CBGs SSI Close PCP follow up  Tobacco abuse Current heavy smoker Last cigarette was last Friday.  Smoking cessation counseling provided Pt is willing to quit  Other Stroke Risk Factors ETOH use, alcohol level <10, advised to drink no more than 1 drink a day  Hospital day # 1  Laurey Morale, MSN, NP-C [facility] [phone]  ATTENDING NOTE: I reviewed above note and agree with the assessment and plan. Pt was seen and examined.   60 year old female with history of diabetes, hypertension, hyperlipidemia, heavy  smoker, recent CAD status post stent admitted for left-sided weakness after discharge from recent STEMI status post stenting.  However, she did not have a PT OT at last admission, not sure exactly her muscle strength at the time of discharge.  CT showed right temporal occipital subacute infarct.  MRI showed embolic shower with largest at the right occipital.  Chronic left thalamic and pontine infarct also shown on MRI.  MRA head and neck left V4, distal BA, right siphon stenosis, occlusion of right V4, as well as left ICA 70% stenosis.  LDL 130, A1c 6.8, 2D echo pending at this time, however 35 to 40% EF on 09/22/2020.  Creatinine 1.20.  On exam, patient awake alert, orientated x3, no aphasia, follows simple commands, able to name and repeat.  No gaze palsy, visual field full.  Mild left nasolabial fold  flattening.  Left upper and lower extremity flaccid 0/5.  Right upper and lower extremity 5/5.  Sensation symmetrical bilaterally.  Right finger-to-nose intact.  Etiology for patient stroke not quite clear, given embolic shower, recent STEMI with stenting, still concerning for cardioembolic source.  Repeat 2D echo pending, if negative, agree with cardiology to proceed with TEE.  We also consider 30-day CardioNet monitoring if work-up unrevealing.  Left ICA 70% stenosis likely asymptomatic at this time, will need follow-up with vascular surgery.  Continue aspirin and Brilinta and high-dose statin post cardiac stent.  Permissive hypertension window has passed, recommend gradually target on goal BP.  Given significance in posterior circulation vascular stenosis, recommend long-term BP goal 130-150.  Stroke risk factor modification, quit smoking.  PT/OT.  Will follow.  For detailed assessment and plan, please refer to above as I have made changes wherever appropriate.   Rosalin Hawking, MD PhD Stroke Neurology 09/28/2020 4:03 PM   To contact Stroke Continuity provider, please refer to [email or web address] After hours, contact General Neurology

## 2020-09-28 NOTE — ED Notes (Signed)
Pt returned from MRI °

## 2020-09-28 NOTE — ED Notes (Signed)
Pt transported to MRI 

## 2020-09-29 DIAGNOSIS — I639 Cerebral infarction, unspecified: Secondary | ICD-10-CM

## 2020-09-29 DIAGNOSIS — N183 Chronic kidney disease, stage 3 unspecified: Secondary | ICD-10-CM

## 2020-09-29 HISTORY — DX: Chronic kidney disease, stage 3 unspecified: N18.30

## 2020-09-29 LAB — BASIC METABOLIC PANEL
Anion gap: 9 (ref 5–15)
BUN: 24 mg/dL — ABNORMAL HIGH (ref 6–20)
CO2: 22 mmol/L (ref 22–32)
Calcium: 9.6 mg/dL (ref 8.9–10.3)
Chloride: 104 mmol/L (ref 98–111)
Creatinine, Ser: 1.1 mg/dL — ABNORMAL HIGH (ref 0.44–1.00)
GFR, Estimated: 58 mL/min — ABNORMAL LOW (ref >60)
Glucose, Bld: 145 mg/dL — ABNORMAL HIGH (ref 70–99)
Potassium: 4.1 mmol/L (ref 3.5–5.1)
Sodium: 135 mmol/L (ref 135–145)

## 2020-09-29 LAB — GLUCOSE, CAPILLARY
Glucose-Capillary: 129 mg/dL — ABNORMAL HIGH (ref 70–99)
Glucose-Capillary: 123 mg/dL — ABNORMAL HIGH (ref 70–99)
Glucose-Capillary: 145 mg/dL — ABNORMAL HIGH (ref 70–99)
Glucose-Capillary: 144 mg/dL — ABNORMAL HIGH (ref 70–99)

## 2020-09-29 MED ORDER — ALUM & MAG HYDROXIDE-SIMETH 200-200-20 MG/5ML PO SUSP
30.0000 mL | Freq: Four times a day (QID) | ORAL | Status: DC | PRN
  Administered 2020-09-29: 30 mL via ORAL
  Filled 2020-09-29: qty 30

## 2020-09-29 NOTE — Progress Notes (Signed)
Physical Therapy Treatment Patient Details Name: Tracey Meyer MRN: [id number] DOB: 28-Nov-1960 Today's Date: 09/29/2020    History of Present Illness 60 yo female presents to [facility] on 7/7 with L weakness, shortness of breath that started on 7/4. 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 CTH specifically shows focus of peripheral hypoattenuation in posterior R parietooccipital lobe. PMH significant for DM2, HTN, ischemic cardiomyopathy EF 35-40%, tobacco use and trying to quit, HLD, breast cancer with L radical mastectomy, CAD s/p angio and stenting of mLAD d/c 7/3.    PT Comments    Pt seen in conjunction with OT to progress mobility, maximize pt safety. Pt requiring min-mod +2 for stand pivot to/from [facility] today, pt with poor midline postural awareness with heavy R lateral leaning. Pt also with + LLE buckling in standing, requiring max guarding and facilitation during transfers. CIR remains appropriate dispo.    Follow Up Recommendations  CIR     Equipment Recommendations  Other (comment) (tbd pending progress)    Recommendations for Other Services Rehab consult     Precautions / Restrictions Precautions Precautions: Fall Precaution Comments: L hemiplegia Restrictions Weight Bearing Restrictions: No    Mobility  Bed Mobility Overal bed mobility: Needs Assistance Bed Mobility: Supine to Sit;Sit to Supine     Supine to sit: Min assist;+2 for physical assistance Sit to supine: Min assist;+2 for physical assistance   General bed mobility comments: min +2 for LLE/LUE management, trunk elevation, cues for RUE on L bedrail to aide in roll and trunk elevation.    Transfers Overall transfer level: Needs assistance Equipment used: 2 person hand held assist Transfers: Sit to/from [facility] Sit to Stand: Mod assist;+2 physical assistance Stand pivot transfers: Mod assist;+2 physical  assistance      Lateral/Scoot Transfers: Mod assist;+2 physical assistance General transfer comment: Mod +2 for power up, rise, steady, correct posture to upright as pt with heavy R lateral leaning, pivot towards R to BSC and recliner, respectively. Pt with difficulty progressing LLE and + buckling, requiring max LE assist. STS x2, stand pivot x2.  Ambulation/Gait             General Gait Details: NT on eval   Stairs             Wheelchair Mobility    Modified Rankin (Stroke Patients Only) Modified Rankin (Stroke Patients Only) Pre-Morbid Rankin Score: No symptoms Modified Rankin: Severe disability     Balance Overall balance assessment: Needs assistance Sitting-balance support: No upper extremity supported;Feet supported Sitting balance-Leahy Scale: Fair Sitting balance - Comments: EOB sitting statically at supervision level   Standing balance support: Bilateral upper extremity supported;During functional activity Standing balance-Leahy Scale: Poor Standing balance comment: reliant on PT/OT assist                            Cognition Arousal/Alertness: Awake/alert Behavior During Therapy: WFL for tasks assessed/performed Overall Cognitive Status: Impaired/Different from baseline Area of Impairment: Following commands;Safety/judgement;Problem solving                       Following Commands: Follows one step commands with increased time Safety/Judgement: Decreased awareness of safety;Decreased awareness of deficits   Problem Solving: Decreased initiation;Difficulty sequencing;Requires verbal cues;Requires tactile cues General Comments: Pt with difficulty following commands during visual assessment, requires repeated VC for directions to perform correctly. Step-by-step sequencing cues  for mobility tasks      Exercises General Exercises - Lower Extremity Ankle Circles/Pumps: PROM;Left;10 reps;Seated Heel Slides: PROM;Left;10 reps;Seated  (up in recliner) Other Exercises Other Exercises: neck TTP and tightness localized to L side: gentle STM to L upper trap, 5 reps R lateral flexion with sustained hold at end range for stretching    General Comments General comments (skin integrity, edema, etc.): vss      Pertinent Vitals/Pain Pain Assessment: Faces Faces Pain Scale: Hurts a little bit Pain Location: L hand, L side neck Pain Descriptors / Indicators: Discomfort;Tightness Pain Intervention(s): Limited activity within patient's tolerance;Monitored during session;Repositioned    Home Living                      Prior Function            PT Goals (current goals can now be found in the care plan section) Acute Rehab PT Goals Patient Stated Goal: be more independent PT Goal Formulation: With patient Time For Goal Achievement: 10/12/20 Potential to Achieve Goals: Good Progress towards PT goals: Progressing toward goals    Frequency    Min 4X/week      PT Plan Current plan remains appropriate    Co-evaluation PT/OT/SLP Co-Evaluation/Treatment: Yes Reason for Co-Treatment: For patient/therapist safety;To address functional/ADL transfers PT goals addressed during session: Mobility/safety with mobility;Balance        AM-PAC PT "6 Clicks" Mobility   Outcome Measure  Help needed turning from your back to your side while in a flat bed without using bedrails?: A Little Help needed moving from lying on your back to sitting on the side of a flat bed without using bedrails?: A Little Help needed moving to and from a bed to a chair (including a wheelchair)?: A Lot Help needed standing up from a chair using your arms (e.g., wheelchair or bedside chair)?: A Lot Help needed to walk in hospital room?: Total Help needed climbing 3-5 steps with a railing? : Total 6 Click Score: 12    End of Session Equipment Utilized During Treatment: Gait belt Activity Tolerance: Patient tolerated treatment well Patient  left: in chair;with chair alarm set;with call bell/phone within reach;with nursing/sitter in room Nurse Communication: Mobility status;Need for lift equipment (use of stedy +2 for back to bed) PT Visit Diagnosis: Other abnormalities of gait and mobility (R26.89);Hemiplegia and hemiparesis Hemiplegia - Right/Left: Left Hemiplegia - dominant/non-dominant: Non-dominant Hemiplegia - caused by: Cerebral infarction     Time: 1700-1749 PT Time Calculation (min) (ACUTE ONLY): 23 min  Charges:  $Therapeutic Activity: 8-22 mins                    Stacie Glaze, PT DPT Acute Rehabilitation Services Pager [phone]  Office [phone]    [place] E Ruffin Pyo 09/29/2020, 11:54 AM

## 2020-09-29 NOTE — Evaluation (Signed)
Occupational Therapy Evaluation Patient Details Name: Tracey Meyer MRN: [id number] DOB: 1960/04/29 Today's Date: 09/29/2020    History of Present Illness 60 yo female presents to [facility] on 7/7 with L weakness, shortness of breath that started on 7/4. 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 CTH specifically shows focus of peripheral hypoattenuation in posterior R parietooccipital lobe. PMH significant for DM2, HTN, ischemic cardiomyopathy EF 35-40%, tobacco use and trying to quit, HLD, breast cancer with L radical mastectomy, CAD s/p angio and stenting of mLAD d/c 7/3.   Clinical Impression   Patient admitted for the diagnosis above.  PTA and CVA, she was living with her SO, who can provide 24 hour assist, and was independent with all mobility and self care.  Barriers are listed below.  Currently she is needing up to Mod A of 2 for transfers and up to Max A for ADL completion and toileting.  CIR is recommended given her propr level and capacity to tolerate a higher intensity rehab to maximize her functional status.  OT to follow acutely.      Follow Up Recommendations  CIR    Equipment Recommendations  3 in 1 bedside commode;Tub/shower seat;Wheelchair (measurements OT);Wheelchair cushion (measurements OT)    Recommendations for Other Services Rehab consult     Precautions / Restrictions Precautions Precautions: Fall Precaution Comments: L hemiplegia Restrictions Weight Bearing Restrictions: No      Mobility Bed Mobility Overal bed mobility: Needs Assistance Bed Mobility: Supine to Sit;Sit to Supine     Supine to sit: Min assist;+2 for physical assistance Sit to supine: Min assist;+2 for physical assistance   General bed mobility comments: min +2 for LLE/LUE management, trunk elevation, cues for RUE on L bedrail to aide in roll and trunk elevation. Patient Response:  Restless;Cooperative  Transfers Overall transfer level: Needs assistance Equipment used: 2 person hand held assist Transfers: Sit to/from [facility] Sit to Stand: Mod assist;+2 physical assistance Stand pivot transfers: Mod assist;+2 physical assistance      Lateral/Scoot Transfers: Mod assist;+2 physical assistance General transfer comment: Mod +2 for power up, rise, steady, correct posture to upright as pt with heavy R lateral leaning, pivot towards R to BSC and recliner, respectively. Pt with difficulty progressing LLE and + buckling, requiring max LE assist. STS x2, stand pivot x2.    Balance Overall balance assessment: Needs assistance Sitting-balance support: Feet supported Sitting balance-Leahy Scale: Fair Sitting balance - Comments: EOB sitting statically at supervision level   Standing balance support: Bilateral upper extremity supported;During functional activity Standing balance-Leahy Scale: Poor Standing balance comment: reliant on PT/OT assist                           ADL either performed or assessed with clinical judgement   ADL Overall ADL's : Needs assistance/impaired     Grooming: Wash/dry hands;Moderate assistance;Sitting   Upper Body Bathing: Bed level;Moderate assistance   Lower Body Bathing: Maximal assistance;Bed level   Upper Body Dressing : Moderate assistance;Sitting   Lower Body Dressing: Moderate assistance;Bed level   Toilet Transfer: +2 for physical assistance;Moderate assistance   Toileting- Clothing Manipulation and Hygiene: Sitting/lateral lean;Minimal assistance Toileting - Clothing Manipulation Details (indicate cue type and reason): thoroughness     Functional mobility during ADLs: Moderate assistance;+2 for physical assistance       Vision Patient Visual Report: Blurring of vision Vision Assessment?: Vision impaired- to  be further tested in functional context Ocular Range of Motion: Within Functional  Limits Alignment/Gaze Preference: Head turned Tracking/Visual Pursuits: Impaired - to be further tested in functional context;Unable to hold eye position out of midline Saccades: Within functional limits Visual Fields: No apparent deficits Additional Comments: patient with complaints of vision "fading" and coming back to the L visual field.     Perception     Praxis      Pertinent Vitals/Pain Pain Assessment: Faces Faces Pain Scale: Hurts little more Pain Location: L hand, L side neck Pain Descriptors / Indicators: Discomfort;Tightness Pain Intervention(s): Monitored during session     Hand Dominance Right   Extremity/Trunk Assessment Upper Extremity Assessment Upper Extremity Assessment: LUE deficits/detail LUE Deficits / Details: flaccid LUE Sensation: WNL LUE Coordination: decreased fine motor;decreased gross motor   Lower Extremity Assessment Lower Extremity Assessment: Defer to PT evaluation   Cervical / Trunk Assessment Cervical / Trunk Assessment: Normal   Communication Communication Communication: No difficulties   Cognition Arousal/Alertness: Awake/alert Behavior During Therapy: WFL for tasks assessed/performed Overall Cognitive Status: Impaired/Different from baseline Area of Impairment: Following commands;Safety/judgement;Problem solving                       Following Commands: Follows one step commands with increased time Safety/Judgement: Decreased awareness of safety;Decreased awareness of deficits   Problem Solving: Decreased initiation;Difficulty sequencing;Requires verbal cues;Requires tactile cues General Comments: Pt with difficulty following commands during visual assessment, requires repeated VC for directions to perform correctly. Step-by-step sequencing cues for mobility tasks   General Comments  vss    Exercises General Exercises - Lower Extremity Ankle Circles/Pumps: PROM;Left;10 reps;Seated Heel Slides: PROM;Left;10 reps;Seated  (up in recliner) Other Exercises Other Exercises: neck TTP and tightness localized to L side: gentle STM to L upper trap, 5 reps R lateral flexion with sustained hold at end range for stretching   Shoulder Instructions      Home Living Family/patient expects to be discharged to:: Private residence Living Arrangements: Spouse/significant other Available Help at Discharge: Family;Available 24 hours/day Type of Home: Mobile home Home Access: Stairs to enter Entrance Stairs-Number of Steps: 3   Home Layout: One level     Bathroom Shower/Tub: Teacher, early years/pre: Standard     Home Equipment: Cane - single point          Prior Functioning/Environment Level of Independence: Independent        Comments: Patient had CVA, but remained at home.  SO was assisting with transfers and ADL.        OT Problem List: Decreased strength;Decreased range of motion;Decreased activity tolerance;Impaired balance (sitting and/or standing);Impaired vision/perception;Impaired UE functional use;Pain      OT Treatment/Interventions: Self-care/ADL training;Therapeutic exercise;Neuromuscular education;DME and/or AE instruction;Balance training;Patient/family education;Therapeutic activities    OT Goals(Current goals can be found in the care plan section) Acute Rehab OT Goals Patient Stated Goal: I want my arm to move OT Goal Formulation: With patient Time For Goal Achievement: 10/13/20 Potential to Achieve Goals: Fair ADL Goals Pt Will Perform Grooming: with min assist;sitting Pt Will Perform Upper Body Bathing: sitting;with min assist Pt Will Perform Upper Body Dressing: with min assist;sitting Pt Will Transfer to Toilet: with mod assist;bedside commode;stand pivot transfer Pt Will Perform Toileting - Clothing Manipulation and hygiene: with supervision;sitting/lateral leans  OT Frequency: Min 2X/week   Barriers to D/C:    none noted       Co-evaluation   Reason for  Co-Treatment: For patient/therapist  safety;To address functional/ADL transfers PT goals addressed during session: Mobility/safety with mobility;Balance        AM-PAC OT "6 Clicks" Daily Activity     Outcome Measure Help from another person eating meals?: A Little Help from another person taking care of personal grooming?: A Little Help from another person toileting, which includes using toliet, bedpan, or urinal?: A Lot Help from another person bathing (including washing, rinsing, drying)?: A Lot Help from another person to put on and taking off regular upper body clothing?: A Lot Help from another person to put on and taking off regular lower body clothing?: A Lot 6 Click Score: 14   End of Session Equipment Utilized During Treatment: Gait belt Nurse Communication: Mobility status  Activity Tolerance: Patient tolerated treatment well Patient left: in chair;with call bell/phone within reach;with chair alarm set  OT Visit Diagnosis: Unsteadiness on feet (R26.81);Other abnormalities of gait and mobility (R26.89);Muscle weakness (generalized) (M62.81);Pain;Hemiplegia and hemiparesis Hemiplegia - Right/Left: Left Hemiplegia - dominant/non-dominant: Non-Dominant Hemiplegia - caused by: Cerebral infarction Pain - Right/Left: Left Pain - part of body: Arm                Time: 1110-1138 OT Time Calculation (min): 28 min Charges:  OT General Charges $OT Visit: 1 Visit OT Evaluation $OT Eval Moderate Complexity: 1 Mod  09/29/2020  Rich, OTR/L  Acute Rehabilitation Services  Office:  [phone]   Metta Clines 09/29/2020, 12:15 PM

## 2020-09-29 NOTE — Progress Notes (Signed)
Progress Note  Patient Name: Tracey Meyer Date of Encounter: 09/29/2020  [facility] Cardiologist: None   Patient Profile     60 y.o. 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  Neuro recommends "permissive Hypertension" concerning for cardioembolic  Subjective   Trouble getting comfortable in bed,  no dyspnea no chest pan and devastated and tearful with the inability to move her left side   Inpatient Medications    Scheduled Meds:   stroke: mapping our early stages of recovery book   Does not apply Once   aspirin EC  81 mg Oral Daily   enoxaparin (LOVENOX) injection  40 mg Subcutaneous Q24H   insulin aspart  0-5 Units Subcutaneous QHS   insulin aspart  0-9 Units Subcutaneous TID WC   rosuvastatin  40 mg Oral Daily   ticagrelor  90 mg Oral BID   Continuous Infusions:  PRN Meds: acetaminophen **OR** acetaminophen (TYLENOL) oral liquid 160 mg/5 mL **OR** acetaminophen   Vital Signs    Vitals:   09/28/20 1940 09/28/20 2130 09/28/20 2341 09/29/20 0421  BP: 136/66 (!) 143/66 127/67 (!) 153/94  Pulse: 69 62 66 70  Resp: 19 17 17 12   Temp: 97.8 F (36.6 C)  97.7 F (36.5 C) 98 F (36.7 C)  TempSrc: Oral  Oral Oral  SpO2: 100% 99% 98% 100%  Weight:    70.4 kg  Height:        Intake/Output Summary (Last 24 hours) at 09/29/2020 1035 Last data filed at 09/28/2020 2220 Gross per 24 hour  Intake --  Output 1250 ml  Net -1250 ml   Last 3 Weights 09/29/2020 09/28/2020 09/27/2020  Weight (lbs) 155 lb 3.3 oz 154 lb 1.6 oz 175 lb  Weight (kg) 70.4 kg 69.9 kg 79.379 kg      Telemetry    sinus Personally Reviewed  ECG    No new tracings - Personally Reviewed  Well developed and nourished in no acute distress HENT normal Neck supple with JVP-  flat  Clear Regular rate and rhythm, no murmurs or  gallops Abd-soft with active BS No Clubbing cyanosis edema Skin-warm and dry A & Oriented  Grossly normal sensory and motor function    Labs    High Sensitivity Troponin:   Recent Labs  Lab 09/22/20 1233 09/27/20 1900 09/27/20 2116  TROPONINIHS >24,000* 15,211* 13,694*       Chemistry Recent Labs  Lab 09/24/20 0317 09/27/20 1900 09/27/20 1950 09/29/20 0912  NA 133* 134* 138 135  K 3.4* 3.8 3.8 4.1  CL 104 104 108 104  CO2 21* 18*  --  22  GLUCOSE 142* 205* 201* 145*  BUN 27* 26* 26* 24*  CREATININE 1.09* 1.30* 1.20* 1.10*  CALCIUM 9.3 9.7  --  9.6  PROT 6.8 6.8  --   --   ALBUMIN 2.9* 3.0*  --   --   AST 163* 28  --   --   ALT 55* 26  --   --   ALKPHOS 48 47  --   --   BILITOT 1.1 0.5  --   --   GFRNONAA 59* 47*  --  58*  ANIONGAP 8 12  --  9      Hematology Recent Labs  Lab 09/23/20 0134 09/24/20 0317 09/27/20 1900  09/27/20 1950  WBC 12.9* 10.3 6.9  --   RBC 4.11 4.20 3.86*  --   HGB 12.7 13.1 12.1 12.2  HCT 38.3 38.5 36.4 36.0  MCV 93.2 91.7 94.3  --   MCH 30.9 31.2 31.3  --   MCHC 33.2 34.0 33.2  --   RDW 13.8 13.3 13.1  --   PLT 249 287 352  --      BNP Recent Labs  Lab 09/27/20 1916  BNP 724.0*      DDimer No results for input(s): DDIMER in the last 168 hours.   Radiology      Cardiac Studies   Echo 09/22/20:  1. Apex well-visualized without contrast - false tendon (normal variant)  - no thrombus. Left ventricular ejection fraction, by estimation, is 35 to  40%. The left ventricle has moderately decreased function. The left  ventricle demonstrates regional wall  motion abnormalities (see scoring diagram/findings for description). There  is mild left ventricular hypertrophy. Left ventricular diastolic  parameters are consistent with Grade I diastolic dysfunction (impaired  relaxation). Elevated left ventricular  end-diastolic pressure. There is severe hypokinesis of the left  ventricular, entire anterior wall, anteroseptal  wall, apical segment and  inferoapical segment. Findings suggest LAD territory ischemia/infarct.   2. Right ventricular systolic function is normal. The right ventricular  size is normal. There is normal pulmonary artery systolic pressure.   3. The mitral valve is abnormal. Mild mitral valve regurgitation.   4. The aortic valve is tricuspid. Aortic valve regurgitation is not  visualized.   5. The inferior vena cava is normal in size with <50% respiratory  variability, suggesting right atrial pressure of 8 mmHg.  LHC 09/22/20: RPDA lesion is 50% stenosed. Dist Cx lesion is 30% stenosed with 30% stenosed side branch in LPAV. 1st Diag lesion is 60% stenosed. Mid LAD lesion is 100% stenosed. Post intervention, there is a 0% residual stenosis. A drug-eluting stent was successfully placed using a STENT ONYX FRONTIER [id number]. LV end diastolic pressure is mildly elevated.   1. 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 Mildly elevated LVEDP 21 mmHg 3. Successful PCI of the mid LAD with DES x 1   Diagnostic Dominance: Co-dominant      Intervention        _____________    Assessment & Plan    1. Acute CVA:( post cath)   StEMI s/p PCI   ICM  with EF 35-40%   CHF chronic   Hyperlipidemia   Continue ASA Ticagrelor, rosuvastatin Repeat Echo EF 30-35% w/o evidence of LV clot HP noted may be added utility of TEE >> will schedule  suspect aortic atheroclerosis is the origin, but LV clot would dictate a change in therapy   Virl Axe 09/29/20

## 2020-09-29 NOTE — Progress Notes (Signed)
Inpatient Rehab Admissions Coordinator:    I met with Pt. To discuss potential CIR admit. She states interest. I also spoke with a friend, Mercer Pod, over the phone who states that she will talk to Pt.'s family to see what support they are able to provide.   Clemens Catholic, [place], [place] Admissions Coordinator  [phone] ([place]) [phone] (office)

## 2020-09-29 NOTE — Progress Notes (Signed)
STROKE TEAM PROGRESS NOTE   INTERVAL HISTORY Patient is sitting in bed for dinner. She still has severe left sided weakness. TTE showed EF 30-35%. Plan for TEE next week.   Vitals:   09/28/20 2341 09/29/20 0421 09/29/20 0800 09/29/20 1338  BP: 127/67 (!) 153/94  (!) 147/108  Pulse: 66 70 65 76  Resp: 17 12 17 15   Temp: 97.7 F (36.5 C) 98 F (36.7 C)  97.8 F (36.6 C)  TempSrc: Oral Oral  Oral  SpO2: 98% 100% 94% 98%  Weight:  70.4 kg    Height:       CBC:  Recent Labs  Lab 09/24/20 0317 09/27/20 1900 09/27/20 1950  WBC 10.3 6.9  --   NEUTROABS  --  4.9  --   HGB 13.1 12.1 12.2  HCT 38.5 36.4 36.0  MCV 91.7 94.3  --   PLT 287 352  --    Basic Metabolic Panel:  Recent Labs  Lab 09/27/20 1900 09/27/20 1950 09/29/20 0912  NA 134* 138 135  K 3.8 3.8 4.1  CL 104 108 104  CO2 18*  --  22  GLUCOSE 205* 201* 145*  BUN 26* 26* 24*  CREATININE 1.30* 1.20* 1.10*  CALCIUM 9.7  --  9.6   Lipid Panel:  Recent Labs  Lab 09/28/20 0404  CHOL 186  TRIG 100  HDL 36*  CHOLHDL 5.2  VLDL 20  LDLCALC 130*   HgbA1c:  Recent Labs  Lab 09/28/20 0403  HGBA1C 6.8*    Alcohol Level  Recent Labs  Lab 09/27/20 1938  ETH <10    IMAGING past 24 hours DG Chest 2 View  Result Date: 09/23/2020 CLINICAL DATA:  Question right lung pneumonia on outside radiograph. EXAM: CHEST - 2 VIEW COMPARISON:  Radiograph yesterday at [facility]. FINDINGS: Lung volumes are low. Borderline cardiomegaly is similar. There is improved right basilar aeration with mild residual atelectasis. No new airspace disease. There is minimal fluid in the fissures without large subpulmonic effusion. No pneumothorax. Stable osseous structures. IMPRESSION: 1. Improved aeration at the right lung base with mild residual atelectasis. No new airspace disease. 2. Trace fluid in the fissures.  Borderline cardiomegaly. Electronically Signed   By: Keith Rake M.D.   On: 09/23/2020 17:51   CT HEAD WO  CONTRAST  Result Date: 09/27/2020 CLINICAL DATA:  Left-sided weakness. EXAM: CT HEAD WITHOUT CONTRAST TECHNIQUE: Contiguous axial images were obtained from the base of the skull through the vertex without intravenous contrast. COMPARISON:  None. FINDINGS: Brain: 18 x 16 x 17 mm focus of peripheral hypo attenuation is identified in posterior right parietooccipital region (image 15/series 2). No evidence for acute hemorrhage, hydrocephalus, or abnormal extra-axial fluid collection. Patchy low attenuation in the deep hemispheric and periventricular white matter is nonspecific, but likely reflects chronic microvascular ischemic demyelination. Vascular: No hyperdense vessel or unexpected calcification. Skull: No evidence for fracture. No worrisome lytic or sclerotic lesion. Sinuses/Orbits: The visualized paranasal sinuses and mastoid air cells are clear. Visualized portions of the globes and intraorbital fat are unremarkable. Other: None. IMPRESSION: 1. 18 x 16 x 17 mm focus of peripheral hypo attenuation in the posterior right parietooccipital region. This is nonspecific and could be related to subacute infarct, but given the somewhat rounded configuration, MRI of the brain without and with contrast recommended to further evaluate. 2. Chronic small vessel white matter ischemic disease. Electronically Signed   By: Misty Stanley M.D.   On: 09/27/2020 19:42   MR  ANGIO HEAD WO CONTRAST  Result Date: 09/28/2020 CLINICAL DATA:  Initial evaluation for neuro deficit, stroke suspected, left-sided weakness. EXAM: MRI HEAD WITHOUT AND WITH CONTRAST MRA HEAD WITHOUT CONTRAST MRA NECK WITHOUT AND WITH CONTRAST TECHNIQUE: Multiplanar, multi-echo pulse sequences of the brain and surrounding structures were acquired without intravenous contrast. Angiographic images of the Circle of Willis were acquired using MRA technique without intravenous contrast. Angiographic images of the neck were acquired using MRA technique without and  with intravenous contrast. Carotid stenosis measurements (when applicable) are obtained utilizing NASCET criteria, using the distal internal carotid diameter as the denominator. CONTRAST:  17mL GADAVIST GADOBUTROL 1 MMOL/ML IV SOLN COMPARISON:  Prior CT from 09/27/2020. FINDINGS: MRI HEAD FINDINGS Brain: Examination moderately degraded by motion artifact. Cerebral volume within normal limits. Patchy T2/FLAIR hyperintensity seen involving the periventricular deep white matter of both cerebral hemispheres, with patchy involvement of the thalami and pons, most consistent with chronic microvascular ischemic disease, moderate in nature. Few scatter remote lacunar infarcts present at the left thalamus and pons. Multiple scattered foci of restricted diffusion seen involving the bilateral cerebral and cerebellar hemispheres, consistent with acute to early subacute ischemic infarcts. The largest infarct involving the left cerebral hemisphere seen involving the cortical and subcortical posterior left frontal lobe in measures 1.4 cm (series 7, image 51). Largest infarct involving the right cerebral hemisphere involves the right occipital cortex and measures 2 cm (series 5, image 74). This accounts for the hypodensity seen on prior CT. Few small cerebellar infarcts measure up to 9 mm on the right. Patchy infarct measuring 1.2 cm seen involving the central/right paracentral medulla (series 5, image 62). No associated hemorrhage or mass effect. Minimal associated enhancement seen about the dominant right occipital infarct. No mass lesion, midline shift, or significant mass effect. No hydrocephalus or extra-axial fluid collection. Pituitary gland suprasellar region within normal limits. Midline structures intact. No other abnormal enhancement. Vascular: Major intracranial vascular flow voids are maintained. Skull and upper cervical spine: Craniocervical junction within normal limits. Bone marrow signal intensity normal. No scalp  soft tissue abnormality. Sinuses/Orbits: Globes and orbital soft tissues demonstrate no acute finding. Mild scattered mucosal thickening noted within the ethmoidal air cells. Paranasal sinuses are otherwise clear. Trace bilateral mastoid effusions noted, of doubtful significance. Inner ear structures grossly normal. Other: None. MRA HEAD FINDINGS Anterior circulation: Examination degraded by motion artifact. Visualized distal cervical segments of the internal carotid arteries are patent with antegrade flow. Petrous segments patent bilaterally. Atheromatous irregularity throughout the carotid siphons bilaterally. No significant stenosis on the left. On the right, there is a probable moderate stenosis involving the proximal cavernous right ICA, better seen on corresponding MRA neck portion of this study (series 4, image 30). A1 segments patent bilaterally. Normal anterior communicating artery complex. Anterior cerebral arteries grossly patent to their distal aspects. No visible M1 stenosis or occlusion. Normal MCA bifurcations. Distal MCA branches well perfused and fairly symmetric. Posterior circulation: Left vertebral artery dominant. Irregular moderate stenosis seen involving the distal left V4 segment just prior to the vertebrobasilar junction, better seen on MRA neck portion of this exam (series 2, image 54). Left PICA not visualized. Right vertebral artery diminutive occludes beyond the takeoff of the right PICA. Partially visualized proximal right PICA patent. Basilar patent proximally. Apparent short-segment severe stenosis involving the mid basilar artery on MIP reconstructions is not seen on corresponding time-of-flight sequence, favored to be artifactual. Mild to moderate stenosis noted at the basilar tip (series 2, image 44). Superior cerebellar arteries  patent bilaterally. Right PCA primarily supplied via the basilar. Left PCA supplied via the basilar as well as a robust left posterior communicating  artery. PCAs perfused to their distal aspects without definite high-grade stenosis. No visible aneurysm on this motion degraded exam. Anatomic variants: Diminutive right vertebral artery largely terminates in PICA. Prominent left posterior communicating artery. MRA NECK FINDINGS Aortic arch: Examination moderately degraded by motion. Visualized aortic arch normal caliber with normal branch pattern. No hemodynamically significant stenosis seen about the origin of the great vessels. Right carotid system: Right CCA patent from its origin to the bifurcation without stenosis. Atheromatous irregularity seen about the right carotid bulb/proximal right ICA with no more than mild 20% stenosis by NASCET criteria. Right ICA patent distally without stenosis, evidence for dissection, or occlusion. Left carotid system: Left CCA patent from its origin to the bifurcation without stenosis. Atheromatous irregularity with up to 70% short-segment stenosis at the origin of the left ICA (series 1060, image 3). Left ICA patent distally without additional stenosis, evidence for dissection or occlusion. Vertebral arteries: Both vertebral arteries arise from subclavian arteries. Vertebral arteries are patent without hemodynamically significant stenosis. No evidence for dissection or occlusion. Other: None IMPRESSION: MRI HEAD: 1. 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 Given the various vascular distributions involved, a central thromboembolic etiology is suspected. 2. Underlying moderate chronic microvascular ischemic disease with a few scattered remote lacunar infarcts involving the left thalamus and pons. MRA HEAD: 1. Technically limited exam due to motion artifact. 2. Negative intracranial MRA for large vessel occlusion. Intracranial atherosclerotic  disease with associated moderate stenoses involving the distal left V4 segment, distal basilar artery, and cavernous right ICA as above. 3. 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 MRA NECK: 1. Technically limited exam due to motion artifact. 2. Approximate 70% atheromatous stenosis at the origin of the left ICA. 3. Otherwise negative MRA of the neck. No other hemodynamically significant or correctable stenosis identified. Electronically Signed   By: Jeannine Boga M.D.   On: 09/28/2020 03:18   MR ANGIO NECK W WO CONTRAST  Result Date: 09/28/2020 CLINICAL DATA:  Initial evaluation for neuro deficit, stroke suspected, left-sided weakness. EXAM: MRI HEAD WITHOUT AND WITH CONTRAST MRA HEAD WITHOUT CONTRAST MRA NECK WITHOUT AND WITH CONTRAST TECHNIQUE: Multiplanar, multi-echo pulse sequences of the brain and surrounding structures were acquired without intravenous contrast. Angiographic images of the Circle of Willis were acquired using MRA technique without intravenous contrast. Angiographic images of the neck were acquired using MRA technique without and with intravenous contrast. Carotid stenosis measurements (when applicable) are obtained utilizing NASCET criteria, using the distal internal carotid diameter as the denominator. CONTRAST:  8mL GADAVIST GADOBUTROL 1 MMOL/ML IV SOLN COMPARISON:  Prior CT from 09/27/2020. FINDINGS: MRI HEAD FINDINGS Brain: Examination moderately degraded by motion artifact. Cerebral volume within normal limits. Patchy T2/FLAIR hyperintensity seen involving the periventricular deep white matter of both cerebral hemispheres, with patchy involvement of the thalami and pons, most consistent with chronic microvascular ischemic disease, moderate in nature. Few scatter remote lacunar infarcts present at the left thalamus and pons. Multiple scattered foci of restricted diffusion seen involving the bilateral cerebral and cerebellar hemispheres, consistent  with acute to early subacute ischemic infarcts. The largest infarct involving the left cerebral hemisphere seen involving the cortical and subcortical posterior left frontal  lobe in measures 1.4 cm (series 7, image 51). Largest infarct involving the right cerebral hemisphere involves the right occipital cortex and measures 2 cm (series 5, image 74). This accounts for the hypodensity seen on prior CT. Few small cerebellar infarcts measure up to 9 mm on the right. Patchy infarct measuring 1.2 cm seen involving the central/right paracentral medulla (series 5, image 62). No associated hemorrhage or mass effect. Minimal associated enhancement seen about the dominant right occipital infarct. No mass lesion, midline shift, or significant mass effect. No hydrocephalus or extra-axial fluid collection. Pituitary gland suprasellar region within normal limits. Midline structures intact. No other abnormal enhancement. Vascular: Major intracranial vascular flow voids are maintained. Skull and upper cervical spine: Craniocervical junction within normal limits. Bone marrow signal intensity normal. No scalp soft tissue abnormality. Sinuses/Orbits: Globes and orbital soft tissues demonstrate no acute finding. Mild scattered mucosal thickening noted within the ethmoidal air cells. Paranasal sinuses are otherwise clear. Trace bilateral mastoid effusions noted, of doubtful significance. Inner ear structures grossly normal. Other: None. MRA HEAD FINDINGS Anterior circulation: Examination degraded by motion artifact. Visualized distal cervical segments of the internal carotid arteries are patent with antegrade flow. Petrous segments patent bilaterally. Atheromatous irregularity throughout the carotid siphons bilaterally. No significant stenosis on the left. On the right, there is a probable moderate stenosis involving the proximal cavernous right ICA, better seen on corresponding MRA neck portion of this study (series 4, image 30). A1  segments patent bilaterally. Normal anterior communicating artery complex. Anterior cerebral arteries grossly patent to their distal aspects. No visible M1 stenosis or occlusion. Normal MCA bifurcations. Distal MCA branches well perfused and fairly symmetric. Posterior circulation: Left vertebral artery dominant. Irregular moderate stenosis seen involving the distal left V4 segment just prior to the vertebrobasilar junction, better seen on MRA neck portion of this exam (series 2, image 54). Left PICA not visualized. Right vertebral artery diminutive occludes beyond the takeoff of the right PICA. Partially visualized proximal right PICA patent. Basilar patent proximally. Apparent short-segment severe stenosis involving the mid basilar artery on MIP reconstructions is not seen on corresponding time-of-flight sequence, favored to be artifactual. Mild to moderate stenosis noted at the basilar tip (series 2, image 44). Superior cerebellar arteries patent bilaterally. Right PCA primarily supplied via the basilar. Left PCA supplied via the basilar as well as a robust left posterior communicating artery. PCAs perfused to their distal aspects without definite high-grade stenosis. No visible aneurysm on this motion degraded exam. Anatomic variants: Diminutive right vertebral artery largely terminates in PICA. Prominent left posterior communicating artery. MRA NECK FINDINGS Aortic arch: Examination moderately degraded by motion. Visualized aortic arch normal caliber with normal branch pattern. No hemodynamically significant stenosis seen about the origin of the great vessels. Right carotid system: Right CCA patent from its origin to the bifurcation without stenosis. Atheromatous irregularity seen about the right carotid bulb/proximal right ICA with no more than mild 20% stenosis by NASCET criteria. Right ICA patent distally without stenosis, evidence for dissection, or occlusion. Left carotid system: Left CCA patent from its  origin to the bifurcation without stenosis. Atheromatous irregularity with up to 70% short-segment stenosis at the origin of the left ICA (series 1060, image 3). Left ICA patent distally without additional stenosis, evidence for dissection or occlusion. Vertebral arteries: Both vertebral arteries arise from subclavian arteries. Vertebral arteries are patent without hemodynamically significant stenosis. No evidence for dissection or occlusion. Other: None IMPRESSION: MRI HEAD: 1. Multiple scattered acute to early subacute ischemic nonhemorrhagic infarcts  involving the bilateral cerebral and cerebellar hemispheres as above, with largest area of infarction measuring up to 2 cm at the right occipital cortex. Given the various vascular distributions involved, a central thromboembolic etiology is suspected. 2. Underlying moderate chronic microvascular ischemic disease with a few scattered remote lacunar infarcts involving the left thalamus and pons. MRA HEAD: 1. Technically limited exam due to motion artifact. 2. Negative intracranial MRA for large vessel occlusion. Intracranial atherosclerotic disease with associated moderate stenoses involving the distal left V4 segment, distal basilar artery, and cavernous right ICA as above. 3. 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 MRA NECK: 1. Technically limited exam due to motion artifact. 2. Approximate 70% atheromatous stenosis at the origin of the left ICA. 3. Otherwise negative MRA of the neck. No other hemodynamically significant or correctable stenosis identified. Electronically Signed   By: Jeannine Boga M.D.   On: 09/28/2020 03:18   MR Brain W and Wo Contrast  Result Date: 09/28/2020 CLINICAL DATA:  Initial evaluation for neuro deficit, stroke suspected, left-sided weakness. EXAM: MRI HEAD WITHOUT AND WITH CONTRAST MRA HEAD WITHOUT CONTRAST MRA NECK WITHOUT AND WITH CONTRAST TECHNIQUE: Multiplanar, multi-echo pulse sequences of  the brain and surrounding structures were acquired without intravenous contrast. Angiographic images of the Circle of Willis were acquired using MRA technique without intravenous contrast. Angiographic images of the neck were acquired using MRA technique without and with intravenous contrast. Carotid stenosis measurements (when applicable) are obtained utilizing NASCET criteria, using the distal internal carotid diameter as the denominator. CONTRAST:  5mL GADAVIST GADOBUTROL 1 MMOL/ML IV SOLN COMPARISON:  Prior CT from 09/27/2020. FINDINGS: MRI HEAD FINDINGS Brain: Examination moderately degraded by motion artifact. Cerebral volume within normal limits. Patchy T2/FLAIR hyperintensity seen involving the periventricular deep white matter of both cerebral hemispheres, with patchy involvement of the thalami and pons, most consistent with chronic microvascular ischemic disease, moderate in nature. Few scatter remote lacunar infarcts present at the left thalamus and pons. Multiple scattered foci of restricted diffusion seen involving the bilateral cerebral and cerebellar hemispheres, consistent with acute to early subacute ischemic infarcts. The largest infarct involving the left cerebral hemisphere seen involving the cortical and subcortical posterior left frontal lobe in measures 1.4 cm (series 7, image 51). Largest infarct involving the right cerebral hemisphere involves the right occipital cortex and measures 2 cm (series 5, image 74). This accounts for the hypodensity seen on prior CT. Few small cerebellar infarcts measure up to 9 mm on the right. Patchy infarct measuring 1.2 cm seen involving the central/right paracentral medulla (series 5, image 62). No associated hemorrhage or mass effect. Minimal associated enhancement seen about the dominant right occipital infarct. No mass lesion, midline shift, or significant mass effect. No hydrocephalus or extra-axial fluid collection. Pituitary gland suprasellar region  within normal limits. Midline structures intact. No other abnormal enhancement. Vascular: Major intracranial vascular flow voids are maintained. Skull and upper cervical spine: Craniocervical junction within normal limits. Bone marrow signal intensity normal. No scalp soft tissue abnormality. Sinuses/Orbits: Globes and orbital soft tissues demonstrate no acute finding. Mild scattered mucosal thickening noted within the ethmoidal air cells. Paranasal sinuses are otherwise clear. Trace bilateral mastoid effusions noted, of doubtful significance. Inner ear structures grossly normal. Other: None. MRA HEAD FINDINGS Anterior circulation: Examination degraded by motion artifact. Visualized distal cervical segments of the internal carotid arteries are patent with antegrade flow. Petrous segments patent bilaterally. Atheromatous irregularity throughout the carotid siphons bilaterally. No significant stenosis on  the left. On the right, there is a probable moderate stenosis involving the proximal cavernous right ICA, better seen on corresponding MRA neck portion of this study (series 4, image 30). A1 segments patent bilaterally. Normal anterior communicating artery complex. Anterior cerebral arteries grossly patent to their distal aspects. No visible M1 stenosis or occlusion. Normal MCA bifurcations. Distal MCA branches well perfused and fairly symmetric. Posterior circulation: Left vertebral artery dominant. Irregular moderate stenosis seen involving the distal left V4 segment just prior to the vertebrobasilar junction, better seen on MRA neck portion of this exam (series 2, image 54). Left PICA not visualized. Right vertebral artery diminutive occludes beyond the takeoff of the right PICA. Partially visualized proximal right PICA patent. Basilar patent proximally. Apparent short-segment severe stenosis involving the mid basilar artery on MIP reconstructions is not seen on corresponding time-of-flight sequence, favored to be  artifactual. Mild to moderate stenosis noted at the basilar tip (series 2, image 44). Superior cerebellar arteries patent bilaterally. Right PCA primarily supplied via the basilar. Left PCA supplied via the basilar as well as a robust left posterior communicating artery. PCAs perfused to their distal aspects without definite high-grade stenosis. No visible aneurysm on this motion degraded exam. Anatomic variants: Diminutive right vertebral artery largely terminates in PICA. Prominent left posterior communicating artery. MRA NECK FINDINGS Aortic arch: Examination moderately degraded by motion. Visualized aortic arch normal caliber with normal branch pattern. No hemodynamically significant stenosis seen about the origin of the great vessels. Right carotid system: Right CCA patent from its origin to the bifurcation without stenosis. Atheromatous irregularity seen about the right carotid bulb/proximal right ICA with no more than mild 20% stenosis by NASCET criteria. Right ICA patent distally without stenosis, evidence for dissection, or occlusion. Left carotid system: Left CCA patent from its origin to the bifurcation without stenosis. Atheromatous irregularity with up to 70% short-segment stenosis at the origin of the left ICA (series 1060, image 3). Left ICA patent distally without additional stenosis, evidence for dissection or occlusion. Vertebral arteries: Both vertebral arteries arise from subclavian arteries. Vertebral arteries are patent without hemodynamically significant stenosis. No evidence for dissection or occlusion. Other: None IMPRESSION: MRI HEAD: 1. 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 Given the various vascular distributions involved, a central thromboembolic etiology is suspected. 2. Underlying moderate chronic  microvascular ischemic disease with a few scattered remote lacunar infarcts involving the left thalamus and pons. MRA HEAD: 1. Technically limited exam due to motion artifact. 2. Negative intracranial MRA for large vessel occlusion. Intracranial atherosclerotic disease with associated moderate stenoses involving the distal left V4 segment, distal basilar artery, and cavernous right ICA as above. 3. 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 MRA NECK: 1. Technically limited exam due to motion artifact. 2. Approximate 70% atheromatous stenosis at the origin of the left ICA. 3. Otherwise negative MRA of the neck. No other hemodynamically significant or correctable stenosis identified. Electronically Signed   By: Jeannine Boga M.D.   On: 09/28/2020 03:18   CARDIAC CATHETERIZATION  Result Date: 09/22/2020  RPDA lesion is 50% stenosed.  Dist Cx lesion is 30% stenosed with 30% stenosed side branch in LPAV.  1st Diag lesion is 60% stenosed.  Mid LAD lesion is 100% stenosed.  Post intervention, there is a 0% residual stenosis.  A drug-eluting stent was  successfully placed using a STENT ONYX FRONTIER 2.5X38.  LV end diastolic pressure is mildly elevated.  1. 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 Mildly elevated LVEDP 21 mmHg 3. Successful PCI of the mid LAD with DES x 1 Plan: DAPT for one year. I suspect she will have significant myocardial injury given late presentation. Will check Echo. Aggressive risk factor modification.   DG Chest Portable 1 View  Result Date: 09/27/2020 CLINICAL DATA:  Code STEMI.  Shortness of breath and recent MI. EXAM: PORTABLE CHEST 1 VIEW COMPARISON:  09/23/2020 FINDINGS: The heart size and mediastinal contours are within normal limits. Both lungs are clear. The visualized skeletal structures are unremarkable. IMPRESSION: No active disease. Electronically Signed   By: Lucienne Capers M.D.   On: 09/27/2020 20:02    ECHOCARDIOGRAM COMPLETE  Result Date: 09/28/2020    ECHOCARDIOGRAM REPORT   Patient Name:   THERSIA PETRAGLIA Date of Exam: 09/28/2020 Medical Rec #:  [id number]          Height:       64.0 in Accession #:    [id number]         Weight:       175.0 lb Date of Birth:  08/11/1960          BSA:          1.848 m Patient Age:    42 years           BP:           139/78 mmHg Patient Gender: F                  HR:           62 bpm. Exam Location:  Inpatient Procedure: 2D Echo, Cardiac Doppler and Color Doppler Indications:    CHF  History:        Patient has prior history of Echocardiogram examinations, most                 recent 09/22/2020. Risk Factors:Diabetes, Dyslipidemia and Current                 Smoker.  Sonographer:    Cammy Brochure Referring Phys: [place]  1. Left ventricular ejection fraction, by estimation, is 30 to 35%. The left ventricle has moderately decreased function. The left ventricle demonstrates regional wall motion abnormalities (see scoring diagram/findings for description). There is mild left ventricular hypertrophy. Left ventricular diastolic parameters are consistent with Grade II diastolic dysfunction (pseudonormalization). Elevated left atrial pressure.  2. No LV thrombus seen  3. Right ventricular systolic function is normal. The right ventricular size is normal. Tricuspid regurgitation signal is inadequate for assessing PA pressure.  4. The mitral valve is normal in structure. Trivial mitral valve regurgitation.  5. The aortic valve was not well visualized. Aortic valve regurgitation is not visualized. No aortic stenosis is present.  6. The inferior vena cava is normal in size with greater than 50% respiratory variability, suggesting right atrial pressure of 3 mmHg. FINDINGS  Left Ventricle: Left ventricular ejection fraction, by estimation, is 30 to 35%. The left ventricle has moderately decreased function. The left ventricle demonstrates regional wall motion  abnormalities. The left ventricular internal cavity size was normal in size. There is mild left ventricular hypertrophy. Left ventricular diastolic parameters are consistent with Grade II diastolic dysfunction (pseudonormalization). Elevated left atrial pressure.  LV Wall Scoring: The mid and distal anterior wall, mid  and distal anterior septum, mid inferoseptal segment, and apex are akinetic. The apical inferior segment is hypokinetic. The entire lateral wall, inferior wall, basal anteroseptal segment, basal anterior segment, and basal inferoseptal segment are normal. Right Ventricle: The right ventricular size is normal. No increase in right ventricular wall thickness. Right ventricular systolic function is normal. Tricuspid regurgitation signal is inadequate for assessing PA pressure. Left Atrium: Left atrial size was normal in size. Right Atrium: Right atrial size was normal in size. Pericardium: There is no evidence of pericardial effusion. Mitral Valve: The mitral valve is normal in structure. Trivial mitral valve regurgitation. Tricuspid Valve: The tricuspid valve is normal in structure. Tricuspid valve regurgitation is trivial. Aortic Valve: The aortic valve was not well visualized. Aortic valve regurgitation is not visualized. No aortic stenosis is present. Aortic valve mean gradient measures 3.0 mmHg. Aortic valve peak gradient measures 5.7 mmHg. Aortic valve area, by VTI measures 1.73 cm. Pulmonic Valve: The pulmonic valve was not well visualized. Pulmonic valve regurgitation is not visualized. Aorta: The aortic root and ascending aorta are structurally normal, with no evidence of dilitation. Venous: The inferior vena cava is normal in size with greater than 50% respiratory variability, suggesting right atrial pressure of 3 mmHg. IAS/Shunts: The interatrial septum was not well visualized.  LEFT VENTRICLE PLAX 2D LVIDd:         5.10 cm  Diastology LVIDs:         3.80 cm  LV e' medial:    4.46 cm/s LV PW:          1.20 cm  LV E/e' medial:  19.5 LV IVS:        1.10 cm  LV e' lateral:   3.59 cm/s LVOT diam:     1.80 cm  LV E/e' lateral: 24.2 LV SV:         41 LV SV Index:   22 LVOT Area:     2.54 cm  RIGHT VENTRICLE RV Basal diam:  3.40 cm RV S prime:     7.94 cm/s LEFT ATRIUM             Index       RIGHT ATRIUM           Index LA diam:        3.70 cm 2.00 cm/m  RA Area:     10.50 cm LA Vol (A2C):   37.1 ml 20.07 ml/m RA Volume:   24.00 ml  12.98 ml/m LA Vol (A4C):   48.9 ml 26.45 ml/m LA Biplane Vol: 43.4 ml 23.48 ml/m  AORTIC VALVE AV Area (Vmax):    1.65 cm AV Area (Vmean):   1.59 cm AV Area (VTI):     1.73 cm AV Vmax:           119.00 cm/s AV Vmean:          75.400 cm/s AV VTI:            0.235 m AV Peak Grad:      5.7 mmHg AV Mean Grad:      3.0 mmHg LVOT Vmax:         77.00 cm/s LVOT Vmean:        47.200 cm/s LVOT VTI:          0.160 m LVOT/AV VTI ratio: 0.68  AORTA Ao Root diam: 2.60 cm Ao Asc diam:  3.30 cm MITRAL VALVE  TRICUSPID VALVE MV Area (PHT): 4.06 cm    TR Peak grad:   19.7 mmHg MV Decel Time: 187 msec    TR Vmax:        222.00 cm/s MV E velocity: 87.00 cm/s MV A velocity: 60.80 cm/s  SHUNTS MV E/A ratio:  1.43        Systemic VTI:  0.16 m                            Systemic Diam: 1.80 cm Oswaldo Milian MD Electronically signed by Oswaldo Milian MD Signature Date/Time: 09/28/2020/4:34:28 PM    Final    ECHOCARDIOGRAM COMPLETE  Result Date: 09/22/2020    ECHOCARDIOGRAM REPORT   Patient Name:   Tracey Meyer Date of Exam: 09/22/2020 Medical Rec #:  [id number]          Height: Accession #:    [id number]         Weight: Date of Birth:  May 20, 1960          BSA: Patient Age:    51 years           BP:           180/99 mmHg Patient Gender: F                  HR:           83 bpm. Exam Location:  Inpatient Procedure: 2D Echo, Cardiac Doppler and Color Doppler Indications:    acute MI  History:        Patient has no prior history of Echocardiogram examinations.                  Acute MI, Signs/Symptoms:Chest Pain; Risk Factors:Hypertension,                 Dyslipidemia, Diabetes and Current Smoker.  Sonographer:    Dustin Flock Referring Phys: [id number] PETER M [place] IMPRESSIONS  1. Apex well-visualized without contrast - false tendon (normal variant) - no thrombus. Left ventricular ejection fraction, by estimation, is 35 to 40%. The left ventricle has moderately decreased function. The left ventricle demonstrates regional wall motion abnormalities (see scoring diagram/findings for description). There is mild left ventricular hypertrophy. Left ventricular diastolic parameters are consistent with Grade I diastolic dysfunction (impaired relaxation). Elevated left ventricular end-diastolic pressure. There is severe hypokinesis of the left ventricular, entire anterior wall, anteroseptal wall, apical segment and inferoapical segment. Findings suggest LAD territory ischemia/infarct.  2. Right ventricular systolic function is normal. The right ventricular size is normal. There is normal pulmonary artery systolic pressure.  3. The mitral valve is abnormal. Mild mitral valve regurgitation.  4. The aortic valve is tricuspid. Aortic valve regurgitation is not visualized.  5. The inferior vena cava is normal in size with <50% respiratory variability, suggesting right atrial pressure of 8 mmHg. Comparison(s): No prior Echocardiogram. FINDINGS  Left Ventricle: Apex well-visualized without contrast - false tendon (normal variant) - no thrombus. Left ventricular ejection fraction, by estimation, is 35 to 40%. The left ventricle has moderately decreased function. The left ventricle demonstrates regional wall motion abnormalities. Severe hypokinesis of the left ventricular, entire anterior wall, anteroseptal wall, apical segment and inferoapical segment. The left ventricular internal cavity size was normal in size. There is mild left ventricular  hypertrophy. Left ventricular diastolic parameters are  consistent with Grade I diastolic dysfunction (impaired relaxation). Elevated left ventricular end-diastolic pressure.  LV Wall Scoring: The  mid and distal anterior wall, mid and distal anterior septum, and entire apex are hypokinetic. The antero-lateral wall, inferior wall, posterior wall, basal anteroseptal segment, mid inferoseptal segment, basal anterior segment, and basal inferoseptal segment are normal. Right Ventricle: The right ventricular size is normal. No increase in right ventricular wall thickness. Right ventricular systolic function is normal. There is normal pulmonary artery systolic pressure. The tricuspid regurgitant velocity is 2.58 m/s, and  with an assumed right atrial pressure of 8 mmHg, the estimated right ventricular systolic pressure is 62.2 mmHg. Left Atrium: Left atrial size was normal in size. Right Atrium: Right atrial size was normal in size. Pericardium: There is no evidence of pericardial effusion. Mitral Valve: The mitral valve is abnormal. There is mild thickening of the mitral valve leaflet(s). Mild mitral valve regurgitation. Tricuspid Valve: The tricuspid valve is grossly normal. Tricuspid valve regurgitation is trivial. Aortic Valve: The aortic valve is tricuspid. Aortic valve regurgitation is not visualized. Pulmonic Valve: The pulmonic valve was grossly normal. Pulmonic valve regurgitation is not visualized. Aorta: The aortic root and ascending aorta are structurally normal, with no evidence of dilitation. Venous: The inferior vena cava is normal in size with less than 50% respiratory variability, suggesting right atrial pressure of 8 mmHg. IAS/Shunts: No atrial level shunt detected by color flow Doppler.  LEFT VENTRICLE PLAX 2D LVIDd:         4.90 cm      Diastology LVIDs:         4.40 cm      LV e' medial:    4.13 cm/s LV PW:         1.20 cm      LV E/e' medial:  16.2 LV IVS:        1.00 cm      LV e' lateral:   3.48 cm/s LVOT diam:     2.10 cm      LV E/e' lateral: 19.3 LV  SV:         49 LVOT Area:     3.46 cm  LV Volumes (MOD) LV vol d, MOD A4C: 131.0 ml LV vol s, MOD A4C: 75.5 ml LV SV MOD A4C:     131.0 ml RIGHT VENTRICLE RV Basal diam:  2.20 cm RV S prime:     9.57 cm/s TAPSE (M-mode): 1.8 cm LEFT ATRIUM             RIGHT ATRIUM LA diam:        3.80 cm RA Area:     10.10 cm LA Vol (A2C):   28.4 ml RA Volume:   19.60 ml LA Vol (A4C):   34.6 ml LA Biplane Vol: 31.7 ml  AORTIC VALVE LVOT Vmax:   83.50 cm/s LVOT Vmean:  54.400 cm/s LVOT VTI:    0.141 m  AORTA Ao Root diam: 2.70 cm MITRAL VALVE                TRICUSPID VALVE MV Area (PHT): 6.65 cm     TR Peak grad:   26.6 mmHg MV Decel Time: 114 msec     TR Vmax:        258.00 cm/s MV E velocity: 67.00 cm/s MV A velocity: 100.00 cm/s  SHUNTS MV E/A ratio:  0.67         Systemic VTI:  0.14 m  Systemic Diam: 2.10 cm Lyman Bishop MD Electronically signed by Lyman Bishop MD Signature Date/Time: 09/22/2020/1:04:05 PM    Final    ABORTED INVASIVE LAB PROCEDURE  Result Date: 09/28/2020 This case was aborted.    PHYSICAL EXAM Physical Exam  Constitutional: Appears well-developed and well-nourished.  Psych: Affect appropriate to situation Eyes: Normal external eye and conjunctiva. Cardiovascular: Normal rate and regular rhythm.   Neurological Examination On exam, patient awake alert, orientated x3, no aphasia, follows simple commands, able to name and repeat.  No gaze palsy, visual field full.  Mild left nasolabial fold flattening.  Left upper and lower extremity flaccid 0/5.  Right upper and lower extremity 5/5.  Sensation symmetrical bilaterally.  Right finger-to-nose intact.  ASSESSMENT/PLAN Tracey Meyer is a 60 y.o. female with PMH significant for DM2, tobacco use and trying to quit, HLD, CAD s/p angio and stenting of mLAD a few days ago who presents with L sided weakness that started on 09/24/20 after discharge. She has also been having shortness of breath and it gets worse with lying  flat.  Stroke:  scattered infarct likely cardioembolic secondary to cardiomyopathy with low EF CT head :No acute abnormality. 8 x 16 x 17 mm focus of peripheral hypo attenuation in the posterior right parietooccipital region.  MRI  1. 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 Given the various vascular distributions involved, a central thromboembolic etiology is suspected. 2. Underlying moderate chronic microvascular ischemic disease with a few scattered remote lacunar infarcts involving the left thalamus and pons. MRA Intracranial atherosclerotic disease with associated moderate stenoses involving the distal left V4 segment, distal basilar artery, and cavernous right ICA as above.Occlusion of the right V4 segment beyond the takeoff of the right PICA.  MRA NECK: Approximate 70% atheromatous stenosis at the origin of the left ICA. 2D Echo: 30-35% down from 35-40% on 09/22/20 Agree with cardiology for TEE, if unrevealing recommend 30 day cardiac event monitoring to rule out afib LDL 130 HgbA1c 6.8 VTE prophylaxis - Lovenox aspirin 81 mg daily and Brilinta (ticagrelor) 90 mg bid prior to admission, now on aspirin 81 mg daily and Brilinta (ticagrelor) 90 mg bid.  Continue ASA and Brillinta per cardiology recommendations. Therapy recommendations:  CIR Disposition:  pending  Recent STEMI s/p stent anterior STEMI 09/21/20 s/p LHC with PCI/DES to LAD.  TTE EF 35-40% with anterior hypokinesis but no apical thrombus at that time. On Aspirin and brilinta and statin Cardiology on board  Cardiomyopathy  Cardiology on board EF 35-40% on 09/22/20 Current TTE repeat EF 30-35% Agree with plan for TEE On coreg, entresto, spironolactone at home  Left ICA stenosis CTA neck showed Left ICA 70% stenosis  likely asymptomatic at  this time will need outpt follow-up with vascular surgery.   Hypertension Home meds:  coreg, entresto, spironolactone Stable Gradually towards goal in 3-5 days Given significance in posterior circulation vascular stenosis, recommend long-term BP goal 130-150.    Hyperlipidemia Home meds:  crestor 10mg , not resumed in hospital LDL 130, goal < 70 Increase crestor to 40 mg Continue statin at discharge  Diabetes type II Controlled Home meds:  metformin HgbA1c 6.8, goal < 7.0 CBGs SSI Close PCP follow up  Tobacco abuse Current heavy smoker Last cigarette was last Friday.  Smoking cessation counseling provided Pt is willing to quit  Other Stroke Risk Factors  ETOH use, alcohol level <10, advised to drink no more than 1 drink a day  Hospital day # 2   Rosalin Hawking, MD PhD Stroke Neurology 09/29/2020 6:18 PM   To contact Stroke Continuity provider, please refer to [email or web address] After hours, contact General Neurology

## 2020-09-29 NOTE — Progress Notes (Signed)
PROGRESS NOTE    Tracey Meyer   NLZ:[id number]  DOB: May 13, 1960  DOA: 09/27/2020 PCP: Pcp, No   Brief Narrative:  Tracey Meyer is a 60 y/o F with CAD (recent stent to the LAD on 7/4), ischemic cardiomyopathy with EF of 35-40%, HTN, DM2, HLD and smoking who presented to the ED with shortness of breath and orthopnea.  She also stated that on the evening of 7/4, she developed left sided weakness and slurred speech which did not improve.   In ED: CT head> 18 x 16 x 17 mm focus of peripheral hypo attenuation in the posterior right parietooccipital region. This is nonspecific and could be related to subacute infarct, but given the somewhat rounded configuration, MRI of the brain without and with contrast recommended to further evaluate.  Cardiology and neurology consulted. IV Lasix started and MRI/ MRA ordered. Admitted to treat acute CHF and work up neurological symptoms.  Subjective: No complaints    Assessment & Plan:   Principal Problem:   Acute ischemic strokes- multifocal and bilateral   Hyperlipidemia -symptoms occurred on the night of 7/4 after she got home from the hospital - MRI > Multiple scattered acute to early subacute ischemic nonhemorrhagic infarcts involving the bilateral cerebral and cerebellar hemispheres as above, with largest area of infarction measuring up to 2 cm at the right occipital cortex. Given the various vascular distributions involved, a central thromboembolic etiology is suspected. -LDL is 130-Crestor started after MI-being increased from 10 mg this admission to 40 mg daily -A1c is 6.8 -2D echo does not reveal a thrombus any thrombus -Neurology recommending to continue 81 mg aspirin and Brilinta -TEE recommended by neurology -Therapy recommends [facility]-I have consulted them  Active Problems: Chronic systolic heart failure (mild) CAD with NSTEMI and stent in mid LAD on 09/22/2020 -Echo 09/22/2020> EF 35 to 40%, mild LVH, grade 1 diastolic  dysfunction, severe hypokinesis of the LV entire anterior wall anteroseptal wall apical segment and inferior apical segment. -Echo 09/28/2020> EF 30 to 90%, grade 2 diastolic dysfunction -Given 20 mg of IV Lasix on 7/7 and no diuretics since - Continue aspirin, Brilinta, Crestor -Antihypertensives being held to allow for permissive hypertension -Cardiology is following  Urinary retention - Patient had the urge to urinate but was unable to-bladder scan revealed 700 cc - In-N-Out cath done and 600 cc of urine - has been voiding since  CKD 3B - GFR ranges from 40s to 60s and is at baseline    DM (diabetes mellitus), type 2 (HCC) -On metformin at home -Hemoglobin A1C    Component Value Date/Time   HGBA1C 6.8 (H) 09/28/2020 0403   Tobacco abuse - Smoking cessation counseling given  Time spent in minutes: 35 DVT prophylaxis: enoxaparin (LOVENOX) injection 40 mg Start: 09/28/20 0800  Code Status: full code Family Communication:  Level of Care: Level of care: Telemetry Cardiac Disposition Plan:  Status is: Inpatient  Remains inpatient appropriate because:Inpatient level of care appropriate due to severity of illness  Dispo: The patient is from: Home              Anticipated d/c is to:  [facility]              Patient currently is not medically stable to d/c.   Difficult to place patient Yes      Consultants:  Cardiology Neurology  Procedures:  none Antimicrobials:  Anti-infectives (From admission, onward)    None        Objective: Vitals:  09/28/20 2341 09/29/20 0421 09/29/20 0800 09/29/20 1338  BP: 127/67 (!) 153/94  (!) 147/108  Pulse: 66 70 65 76  Resp: 17 12 17 15   Temp: 97.7 F (36.5 C) 98 F (36.7 C)  97.8 F (36.6 C)  TempSrc: Oral Oral  Oral  SpO2: 98% 100% 94% 98%  Weight:  70.4 kg    Height:        Intake/Output Summary (Last 24 hours) at 09/29/2020 1548 Last data filed at 09/29/2020 1130 Gross per 24 hour  Intake --  Output 600 ml  Net -600 ml    Filed Weights   09/27/20 1935 09/28/20 1415 09/29/20 0421  Weight: 79.4 kg 69.9 kg 70.4 kg    Examination: General exam: Appears comfortable  HEENT: PERRLA, oral mucosa moist, no sclera icterus or thrush Respiratory system: Clear to auscultation. Respiratory effort normal. Cardiovascular system: S1 & S2 heard, regular rate and rhythm Gastrointestinal system: Abdomen soft, non-tender, nondistended. Normal bowel sounds   Central nervous system: Alert and oriented.  Right facial droop which disappears when she smiles, 0 out of 5 strength in left side Extremities: No cyanosis, clubbing or edema Skin: No rashes or ulcers Psychiatry: Flat affect    Data Reviewed: I have personally reviewed following labs and imaging studies  CBC: Recent Labs  Lab 09/23/20 0134 09/24/20 0317 09/27/20 1900 09/27/20 1950  WBC 12.9* 10.3 6.9  --   NEUTROABS  --   --  4.9  --   HGB 12.7 13.1 12.1 12.2  HCT 38.3 38.5 36.4 36.0  MCV 93.2 91.7 94.3  --   PLT 249 287 352  --     Basic Metabolic Panel: Recent Labs  Lab 09/23/20 0134 09/24/20 0317 09/27/20 1900 09/27/20 1950 09/29/20 0912  NA 132* 133* 134* 138 135  K 3.9 3.4* 3.8 3.8 4.1  CL 100 104 104 108 104  CO2 22 21* 18*  --  22  GLUCOSE 194* 142* 205* 201* 145*  BUN 28* 27* 26* 26* 24*  CREATININE 1.14* 1.09* 1.30* 1.20* 1.10*  CALCIUM 9.7 9.3 9.7  --  9.6    GFR: Estimated Creatinine Clearance: 53 mL/min (A) (by C-G formula based on SCr of 1.1 mg/dL (H)). Liver Function Tests: Recent Labs  Lab 09/24/20 0317 09/27/20 1900  AST 163* 28  ALT 55* 26  ALKPHOS 48 47  BILITOT 1.1 0.5  PROT 6.8 6.8  ALBUMIN 2.9* 3.0*    No results for input(s): LIPASE, AMYLASE in the last 168 hours. No results for input(s): AMMONIA in the last 168 hours. Coagulation Profile: Recent Labs  Lab 09/27/20 1900  INR 1.0    Cardiac Enzymes: No results for input(s): CKTOTAL, CKMB, CKMBINDEX, TROPONINI in the last 168 hours. BNP (last 3  results) No results for input(s): PROBNP in the last 8760 hours. HbA1C: Recent Labs    09/28/20 0403  HGBA1C 6.8*    CBG: Recent Labs  Lab 09/28/20 1254 09/28/20 1710 09/28/20 2128 09/29/20 0807 09/29/20 1206  GLUCAP 126* 123* 126* 129* 123*    Lipid Profile: Recent Labs    09/28/20 0404  CHOL 186  HDL 36*  LDLCALC 130*  TRIG 100  CHOLHDL 5.2    Thyroid Function Tests: No results for input(s): TSH, T4TOTAL, FREET4, T3FREE, THYROIDAB in the last 72 hours. Anemia Panel: No results for input(s): VITAMINB12, FOLATE, FERRITIN, TIBC, IRON, RETICCTPCT in the last 72 hours. Urine analysis:    Component Value Date/Time   COLORURINE YELLOW 09/28/2020 1528  APPEARANCEUR CLEAR 09/28/2020 1528   LABSPEC 1.020 09/28/2020 1528   PHURINE 5.5 09/28/2020 1528   GLUCOSEU NEGATIVE 09/28/2020 1528   HGBUR NEGATIVE 09/28/2020 [place] 09/28/2020 1528   KETONESUR NEGATIVE 09/28/2020 1528   PROTEINUR NEGATIVE 09/28/2020 1528   NITRITE POSITIVE (A) 09/28/2020 1528   LEUKOCYTESUR NEGATIVE 09/28/2020 1528   Sepsis Labs: @LABRCNTIP (procalcitonin:4,lacticidven:4) ) Recent Results (from the past 240 hour(s))  Resp Panel by RT-PCR (Flu A&B, Covid) Nasopharyngeal Swab     Status: None   Collection Time: 09/22/20  9:18 AM   Specimen: Nasopharyngeal Swab; Nasopharyngeal(NP) swabs in vial transport medium  Result Value Ref Range Status   SARS Coronavirus 2 by RT PCR NEGATIVE NEGATIVE Final    Comment: (NOTE) SARS-CoV-2 target nucleic acids are NOT DETECTED.  The SARS-CoV-2 RNA is generally detectable in upper respiratory specimens during the acute phase of infection. The lowest concentration of SARS-CoV-2 viral copies this assay can detect is 138 copies/mL. A negative result does not preclude SARS-Cov-2 infection and should not be used as the sole basis for treatment or other patient management decisions. A negative result may occur with  improper specimen  collection/handling, submission of specimen other than nasopharyngeal swab, presence of viral mutation(s) within the areas targeted by this assay, and inadequate number of viral copies(<138 copies/mL). A negative result must be combined with clinical observations, patient history, and epidemiological information. The expected result is Negative.  Fact Sheet for Patients:  EntrepreneurPulse.com.au  Fact Sheet for Healthcare Providers:  IncredibleEmployment.be  This test is no t yet approved or cleared by the [place] FDA and  has been authorized for detection and/or diagnosis of SARS-CoV-2 by FDA under an Emergency Use Authorization (EUA). This EUA will remain  in effect (meaning this test can be used) for the duration of the COVID-19 declaration under Section 564(b)(1) of the Act, 21 U.S.C.section 360bbb-3(b)(1), unless the authorization is terminated  or revoked sooner.       Influenza A by PCR NEGATIVE NEGATIVE Final   Influenza B by PCR NEGATIVE NEGATIVE Final    Comment: (NOTE) The Xpert Xpress SARS-CoV-2/FLU/RSV plus assay is intended as an aid in the diagnosis of influenza from Nasopharyngeal swab specimens and should not be used as a sole basis for treatment. Nasal washings and aspirates are unacceptable for Xpert Xpress SARS-CoV-2/FLU/RSV testing.  Fact Sheet for Patients: EntrepreneurPulse.com.au  Fact Sheet for Healthcare Providers: IncredibleEmployment.be  This test is not yet approved or cleared by the [place] FDA and has been authorized for detection and/or diagnosis of SARS-CoV-2 by FDA under an Emergency Use Authorization (EUA). This EUA will remain in effect (meaning this test can be used) for the duration of the COVID-19 declaration under Section 564(b)(1) of the Act, 21 U.S.C. section 360bbb-3(b)(1), unless the authorization is terminated or revoked.  Performed at [facility], [place] [address], [place], [place] [address]   MRSA Next Gen by PCR, Nasal     Status: None   Collection Time: 09/22/20 10:59 AM   Specimen: Nasal Mucosa; Nasal Swab  Result Value Ref Range Status   MRSA by PCR Next Gen NOT DETECTED NOT DETECTED Final    Comment: (NOTE) The GeneXpert MRSA Assay (FDA approved for NASAL specimens only), is one component of a comprehensive MRSA colonization surveillance program. It is not intended to diagnose MRSA infection nor to guide or monitor treatment for MRSA infections. Test performance is not FDA approved in patients less than 21 years old. Performed  at [facility], [place] [address] [place], [place] [address]   Resp Panel by RT-PCR (Flu A&B, Covid) Nasopharyngeal Swab     Status: None   Collection Time: 09/27/20  6:54 PM   Specimen: Nasopharyngeal Swab; Nasopharyngeal(NP) swabs in vial transport medium  Result Value Ref Range Status   SARS Coronavirus 2 by RT PCR NEGATIVE NEGATIVE Final    Comment: (NOTE) SARS-CoV-2 target nucleic acids are NOT DETECTED.  The SARS-CoV-2 RNA is generally detectable in upper respiratory specimens during the acute phase of infection. The lowest concentration of SARS-CoV-2 viral copies this assay can detect is 138 copies/mL. A negative result does not preclude SARS-Cov-2 infection and should not be used as the sole basis for treatment or other patient management decisions. A negative result may occur with  improper specimen collection/handling, submission of specimen other than nasopharyngeal swab, presence of viral mutation(s) within the areas targeted by this assay, and inadequate number of viral copies(<138 copies/mL). A negative result must be combined with clinical observations, patient history, and epidemiological information. The expected result is Negative.  Fact Sheet for Patients:  EntrepreneurPulse.com.au  Fact Sheet for Healthcare Providers:   IncredibleEmployment.be  This test is no t yet approved or cleared by the [place] FDA and  has been authorized for detection and/or diagnosis of SARS-CoV-2 by FDA under an Emergency Use Authorization (EUA). This EUA will remain  in effect (meaning this test can be used) for the duration of the COVID-19 declaration under Section 564(b)(1) of the Act, 21 U.S.C.section 360bbb-3(b)(1), unless the authorization is terminated  or revoked sooner.       Influenza A by PCR NEGATIVE NEGATIVE Final   Influenza B by PCR NEGATIVE NEGATIVE Final    Comment: (NOTE) The Xpert Xpress SARS-CoV-2/FLU/RSV plus assay is intended as an aid in the diagnosis of influenza from Nasopharyngeal swab specimens and should not be used as a sole basis for treatment. Nasal washings and aspirates are unacceptable for Xpert Xpress SARS-CoV-2/FLU/RSV testing.  Fact Sheet for Patients: EntrepreneurPulse.com.au  Fact Sheet for Healthcare Providers: IncredibleEmployment.be  This test is not yet approved or cleared by the [place] FDA and has been authorized for detection and/or diagnosis of SARS-CoV-2 by FDA under an Emergency Use Authorization (EUA). This EUA will remain in effect (meaning this test can be used) for the duration of the COVID-19 declaration under Section 564(b)(1) of the Act, 21 U.S.C. section 360bbb-3(b)(1), unless the authorization is terminated or revoked.  Performed at [facility], [place] [address] [place], [place] [address]           Radiology Studies: CT HEAD WO CONTRAST  Result Date: 09/27/2020 CLINICAL DATA:  Left-sided weakness. EXAM: CT HEAD WITHOUT CONTRAST TECHNIQUE: Contiguous axial images were obtained from the base of the skull through the vertex without intravenous contrast. COMPARISON:  None. FINDINGS: Brain: 18 x 16 x 17 mm focus of peripheral hypo attenuation is identified in posterior right  parietooccipital region (image 15/series 2). No evidence for acute hemorrhage, hydrocephalus, or abnormal extra-axial fluid collection. Patchy low attenuation in the deep hemispheric and periventricular white matter is nonspecific, but likely reflects chronic microvascular ischemic demyelination. Vascular: No hyperdense vessel or unexpected calcification. Skull: No evidence for fracture. No worrisome lytic or sclerotic lesion. Sinuses/Orbits: The visualized paranasal sinuses and mastoid air cells are clear. Visualized portions of the globes and intraorbital fat are unremarkable. Other: None. IMPRESSION: 1. 18 x 16 x 17 mm focus of peripheral hypo attenuation in the  posterior right parietooccipital region. This is nonspecific and could be related to subacute infarct, but given the somewhat rounded configuration, MRI of the brain without and with contrast recommended to further evaluate. 2. Chronic small vessel white matter ischemic disease. Electronically Signed   By: Misty Stanley M.D.   On: 09/27/2020 19:42   MR ANGIO HEAD WO CONTRAST  Result Date: 09/28/2020 CLINICAL DATA:  Initial evaluation for neuro deficit, stroke suspected, left-sided weakness. EXAM: MRI HEAD WITHOUT AND WITH CONTRAST MRA HEAD WITHOUT CONTRAST MRA NECK WITHOUT AND WITH CONTRAST TECHNIQUE: Multiplanar, multi-echo pulse sequences of the brain and surrounding structures were acquired without intravenous contrast. Angiographic images of the Circle of Willis were acquired using MRA technique without intravenous contrast. Angiographic images of the neck were acquired using MRA technique without and with intravenous contrast. Carotid stenosis measurements (when applicable) are obtained utilizing NASCET criteria, using the distal internal carotid diameter as the denominator. CONTRAST:  77mL GADAVIST GADOBUTROL 1 MMOL/ML IV SOLN COMPARISON:  Prior CT from 09/27/2020. FINDINGS: MRI HEAD FINDINGS Brain: Examination moderately degraded by motion  artifact. Cerebral volume within normal limits. Patchy T2/FLAIR hyperintensity seen involving the periventricular deep white matter of both cerebral hemispheres, with patchy involvement of the thalami and pons, most consistent with chronic microvascular ischemic disease, moderate in nature. Few scatter remote lacunar infarcts present at the left thalamus and pons. Multiple scattered foci of restricted diffusion seen involving the bilateral cerebral and cerebellar hemispheres, consistent with acute to early subacute ischemic infarcts. The largest infarct involving the left cerebral hemisphere seen involving the cortical and subcortical posterior left frontal lobe in measures 1.4 cm (series 7, image 51). Largest infarct involving the right cerebral hemisphere involves the right occipital cortex and measures 2 cm (series 5, image 74). This accounts for the hypodensity seen on prior CT. Few small cerebellar infarcts measure up to 9 mm on the right. Patchy infarct measuring 1.2 cm seen involving the central/right paracentral medulla (series 5, image 62). No associated hemorrhage or mass effect. Minimal associated enhancement seen about the dominant right occipital infarct. No mass lesion, midline shift, or significant mass effect. No hydrocephalus or extra-axial fluid collection. Pituitary gland suprasellar region within normal limits. Midline structures intact. No other abnormal enhancement. Vascular: Major intracranial vascular flow voids are maintained. Skull and upper cervical spine: Craniocervical junction within normal limits. Bone marrow signal intensity normal. No scalp soft tissue abnormality. Sinuses/Orbits: Globes and orbital soft tissues demonstrate no acute finding. Mild scattered mucosal thickening noted within the ethmoidal air cells. Paranasal sinuses are otherwise clear. Trace bilateral mastoid effusions noted, of doubtful significance. Inner ear structures grossly normal. Other: None. MRA HEAD FINDINGS  Anterior circulation: Examination degraded by motion artifact. Visualized distal cervical segments of the internal carotid arteries are patent with antegrade flow. Petrous segments patent bilaterally. Atheromatous irregularity throughout the carotid siphons bilaterally. No significant stenosis on the left. On the right, there is a probable moderate stenosis involving the proximal cavernous right ICA, better seen on corresponding MRA neck portion of this study (series 4, image 30). A1 segments patent bilaterally. Normal anterior communicating artery complex. Anterior cerebral arteries grossly patent to their distal aspects. No visible M1 stenosis or occlusion. Normal MCA bifurcations. Distal MCA branches well perfused and fairly symmetric. Posterior circulation: Left vertebral artery dominant. Irregular moderate stenosis seen involving the distal left V4 segment just prior to the vertebrobasilar junction, better seen on MRA neck portion of this exam (series 2, image 54). Left PICA not visualized. Right vertebral artery  diminutive occludes beyond the takeoff of the right PICA. Partially visualized proximal right PICA patent. Basilar patent proximally. Apparent short-segment severe stenosis involving the mid basilar artery on MIP reconstructions is not seen on corresponding time-of-flight sequence, favored to be artifactual. Mild to moderate stenosis noted at the basilar tip (series 2, image 44). Superior cerebellar arteries patent bilaterally. Right PCA primarily supplied via the basilar. Left PCA supplied via the basilar as well as a robust left posterior communicating artery. PCAs perfused to their distal aspects without definite high-grade stenosis. No visible aneurysm on this motion degraded exam. Anatomic variants: Diminutive right vertebral artery largely terminates in PICA. Prominent left posterior communicating artery. MRA NECK FINDINGS Aortic arch: Examination moderately degraded by motion. Visualized aortic  arch normal caliber with normal branch pattern. No hemodynamically significant stenosis seen about the origin of the great vessels. Right carotid system: Right CCA patent from its origin to the bifurcation without stenosis. Atheromatous irregularity seen about the right carotid bulb/proximal right ICA with no more than mild 20% stenosis by NASCET criteria. Right ICA patent distally without stenosis, evidence for dissection, or occlusion. Left carotid system: Left CCA patent from its origin to the bifurcation without stenosis. Atheromatous irregularity with up to 70% short-segment stenosis at the origin of the left ICA (series 1060, image 3). Left ICA patent distally without additional stenosis, evidence for dissection or occlusion. Vertebral arteries: Both vertebral arteries arise from subclavian arteries. Vertebral arteries are patent without hemodynamically significant stenosis. No evidence for dissection or occlusion. Other: None IMPRESSION: MRI HEAD: 1. 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 Given the various vascular distributions involved, a central thromboembolic etiology is suspected. 2. Underlying moderate chronic microvascular ischemic disease with a few scattered remote lacunar infarcts involving the left thalamus and pons. MRA HEAD: 1. Technically limited exam due to motion artifact. 2. Negative intracranial MRA for large vessel occlusion. Intracranial atherosclerotic disease with associated moderate stenoses involving the distal left V4 segment, distal basilar artery, and cavernous right ICA as above. 3. 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 MRA NECK: 1. Technically limited exam due to motion artifact. 2. Approximate 70% atheromatous stenosis at the origin of the left ICA. 3.  Otherwise negative MRA of the neck. No other hemodynamically significant or correctable stenosis identified. Electronically Signed   By: Jeannine Boga M.D.   On: 09/28/2020 03:18   MR ANGIO NECK W WO CONTRAST  Result Date: 09/28/2020 CLINICAL DATA:  Initial evaluation for neuro deficit, stroke suspected, left-sided weakness. EXAM: MRI HEAD WITHOUT AND WITH CONTRAST MRA HEAD WITHOUT CONTRAST MRA NECK WITHOUT AND WITH CONTRAST TECHNIQUE: Multiplanar, multi-echo pulse sequences of the brain and surrounding structures were acquired without intravenous contrast. Angiographic images of the Circle of Willis were acquired using MRA technique without intravenous contrast. Angiographic images of the neck were acquired using MRA technique without and with intravenous contrast. Carotid stenosis measurements (when applicable) are obtained utilizing NASCET criteria, using the distal internal carotid diameter as the denominator. CONTRAST:  75mL GADAVIST GADOBUTROL 1 MMOL/ML IV SOLN COMPARISON:  Prior CT from 09/27/2020. FINDINGS: MRI HEAD FINDINGS Brain: Examination moderately degraded by motion artifact. Cerebral volume within normal limits. Patchy T2/FLAIR hyperintensity seen involving the periventricular deep white matter of both cerebral hemispheres, with patchy involvement of the thalami and pons, most consistent with chronic microvascular  ischemic disease, moderate in nature. Few scatter remote lacunar infarcts present at the left thalamus and pons. Multiple scattered foci of restricted diffusion seen involving the bilateral cerebral and cerebellar hemispheres, consistent with acute to early subacute ischemic infarcts. The largest infarct involving the left cerebral hemisphere seen involving the cortical and subcortical posterior left frontal lobe in measures 1.4 cm (series 7, image 51). Largest infarct involving the right cerebral hemisphere involves the right occipital cortex and measures 2 cm (series 5, image  74). This accounts for the hypodensity seen on prior CT. Few small cerebellar infarcts measure up to 9 mm on the right. Patchy infarct measuring 1.2 cm seen involving the central/right paracentral medulla (series 5, image 62). No associated hemorrhage or mass effect. Minimal associated enhancement seen about the dominant right occipital infarct. No mass lesion, midline shift, or significant mass effect. No hydrocephalus or extra-axial fluid collection. Pituitary gland suprasellar region within normal limits. Midline structures intact. No other abnormal enhancement. Vascular: Major intracranial vascular flow voids are maintained. Skull and upper cervical spine: Craniocervical junction within normal limits. Bone marrow signal intensity normal. No scalp soft tissue abnormality. Sinuses/Orbits: Globes and orbital soft tissues demonstrate no acute finding. Mild scattered mucosal thickening noted within the ethmoidal air cells. Paranasal sinuses are otherwise clear. Trace bilateral mastoid effusions noted, of doubtful significance. Inner ear structures grossly normal. Other: None. MRA HEAD FINDINGS Anterior circulation: Examination degraded by motion artifact. Visualized distal cervical segments of the internal carotid arteries are patent with antegrade flow. Petrous segments patent bilaterally. Atheromatous irregularity throughout the carotid siphons bilaterally. No significant stenosis on the left. On the right, there is a probable moderate stenosis involving the proximal cavernous right ICA, better seen on corresponding MRA neck portion of this study (series 4, image 30). A1 segments patent bilaterally. Normal anterior communicating artery complex. Anterior cerebral arteries grossly patent to their distal aspects. No visible M1 stenosis or occlusion. Normal MCA bifurcations. Distal MCA branches well perfused and fairly symmetric. Posterior circulation: Left vertebral artery dominant. Irregular moderate stenosis seen  involving the distal left V4 segment just prior to the vertebrobasilar junction, better seen on MRA neck portion of this exam (series 2, image 54). Left PICA not visualized. Right vertebral artery diminutive occludes beyond the takeoff of the right PICA. Partially visualized proximal right PICA patent. Basilar patent proximally. Apparent short-segment severe stenosis involving the mid basilar artery on MIP reconstructions is not seen on corresponding time-of-flight sequence, favored to be artifactual. Mild to moderate stenosis noted at the basilar tip (series 2, image 44). Superior cerebellar arteries patent bilaterally. Right PCA primarily supplied via the basilar. Left PCA supplied via the basilar as well as a robust left posterior communicating artery. PCAs perfused to their distal aspects without definite high-grade stenosis. No visible aneurysm on this motion degraded exam. Anatomic variants: Diminutive right vertebral artery largely terminates in PICA. Prominent left posterior communicating artery. MRA NECK FINDINGS Aortic arch: Examination moderately degraded by motion. Visualized aortic arch normal caliber with normal branch pattern. No hemodynamically significant stenosis seen about the origin of the great vessels. Right carotid system: Right CCA patent from its origin to the bifurcation without stenosis. Atheromatous irregularity seen about the right carotid bulb/proximal right ICA with no more than mild 20% stenosis by NASCET criteria. Right ICA patent distally without stenosis, evidence for dissection, or occlusion. Left carotid system: Left CCA patent from its origin to the bifurcation without stenosis. Atheromatous irregularity with up to 70% short-segment stenosis at the origin of the  left ICA (series 1060, image 3). Left ICA patent distally without additional stenosis, evidence for dissection or occlusion. Vertebral arteries: Both vertebral arteries arise from subclavian arteries. Vertebral arteries  are patent without hemodynamically significant stenosis. No evidence for dissection or occlusion. Other: None IMPRESSION: MRI HEAD: 1. 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 Given the various vascular distributions involved, a central thromboembolic etiology is suspected. 2. Underlying moderate chronic microvascular ischemic disease with a few scattered remote lacunar infarcts involving the left thalamus and pons. MRA HEAD: 1. Technically limited exam due to motion artifact. 2. Negative intracranial MRA for large vessel occlusion. Intracranial atherosclerotic disease with associated moderate stenoses involving the distal left V4 segment, distal basilar artery, and cavernous right ICA as above. 3. 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 MRA NECK: 1. Technically limited exam due to motion artifact. 2. Approximate 70% atheromatous stenosis at the origin of the left ICA. 3. Otherwise negative MRA of the neck. No other hemodynamically significant or correctable stenosis identified. Electronically Signed   By: Jeannine Boga M.D.   On: 09/28/2020 03:18   MR Brain W and Wo Contrast  Result Date: 09/28/2020 CLINICAL DATA:  Initial evaluation for neuro deficit, stroke suspected, left-sided weakness. EXAM: MRI HEAD WITHOUT AND WITH CONTRAST MRA HEAD WITHOUT CONTRAST MRA NECK WITHOUT AND WITH CONTRAST TECHNIQUE: Multiplanar, multi-echo pulse sequences of the brain and surrounding structures were acquired without intravenous contrast. Angiographic images of the Circle of Willis were acquired using MRA technique without intravenous contrast. Angiographic images of the neck were acquired using MRA technique without and with intravenous contrast. Carotid stenosis measurements (when applicable) are  obtained utilizing NASCET criteria, using the distal internal carotid diameter as the denominator. CONTRAST:  61mL GADAVIST GADOBUTROL 1 MMOL/ML IV SOLN COMPARISON:  Prior CT from 09/27/2020. FINDINGS: MRI HEAD FINDINGS Brain: Examination moderately degraded by motion artifact. Cerebral volume within normal limits. Patchy T2/FLAIR hyperintensity seen involving the periventricular deep white matter of both cerebral hemispheres, with patchy involvement of the thalami and pons, most consistent with chronic microvascular ischemic disease, moderate in nature. Few scatter remote lacunar infarcts present at the left thalamus and pons. Multiple scattered foci of restricted diffusion seen involving the bilateral cerebral and cerebellar hemispheres, consistent with acute to early subacute ischemic infarcts. The largest infarct involving the left cerebral hemisphere seen involving the cortical and subcortical posterior left frontal lobe in measures 1.4 cm (series 7, image 51). Largest infarct involving the right cerebral hemisphere involves the right occipital cortex and measures 2 cm (series 5, image 74). This accounts for the hypodensity seen on prior CT. Few small cerebellar infarcts measure up to 9 mm on the right. Patchy infarct measuring 1.2 cm seen involving the central/right paracentral medulla (series 5, image 62). No associated hemorrhage or mass effect. Minimal associated enhancement seen about the dominant right occipital infarct. No mass lesion, midline shift, or significant mass effect. No hydrocephalus or extra-axial fluid collection. Pituitary gland suprasellar region within normal limits. Midline structures intact. No other abnormal enhancement. Vascular: Major intracranial vascular flow voids are maintained. Skull and upper cervical spine: Craniocervical junction within normal limits. Bone marrow signal intensity normal. No scalp soft tissue abnormality. Sinuses/Orbits: Globes and orbital soft tissues  demonstrate no acute finding. Mild scattered mucosal thickening noted within the ethmoidal air cells. Paranasal sinuses  are otherwise clear. Trace bilateral mastoid effusions noted, of doubtful significance. Inner ear structures grossly normal. Other: None. MRA HEAD FINDINGS Anterior circulation: Examination degraded by motion artifact. Visualized distal cervical segments of the internal carotid arteries are patent with antegrade flow. Petrous segments patent bilaterally. Atheromatous irregularity throughout the carotid siphons bilaterally. No significant stenosis on the left. On the right, there is a probable moderate stenosis involving the proximal cavernous right ICA, better seen on corresponding MRA neck portion of this study (series 4, image 30). A1 segments patent bilaterally. Normal anterior communicating artery complex. Anterior cerebral arteries grossly patent to their distal aspects. No visible M1 stenosis or occlusion. Normal MCA bifurcations. Distal MCA branches well perfused and fairly symmetric. Posterior circulation: Left vertebral artery dominant. Irregular moderate stenosis seen involving the distal left V4 segment just prior to the vertebrobasilar junction, better seen on MRA neck portion of this exam (series 2, image 54). Left PICA not visualized. Right vertebral artery diminutive occludes beyond the takeoff of the right PICA. Partially visualized proximal right PICA patent. Basilar patent proximally. Apparent short-segment severe stenosis involving the mid basilar artery on MIP reconstructions is not seen on corresponding time-of-flight sequence, favored to be artifactual. Mild to moderate stenosis noted at the basilar tip (series 2, image 44). Superior cerebellar arteries patent bilaterally. Right PCA primarily supplied via the basilar. Left PCA supplied via the basilar as well as a robust left posterior communicating artery. PCAs perfused to their distal aspects without definite high-grade  stenosis. No visible aneurysm on this motion degraded exam. Anatomic variants: Diminutive right vertebral artery largely terminates in PICA. Prominent left posterior communicating artery. MRA NECK FINDINGS Aortic arch: Examination moderately degraded by motion. Visualized aortic arch normal caliber with normal branch pattern. No hemodynamically significant stenosis seen about the origin of the great vessels. Right carotid system: Right CCA patent from its origin to the bifurcation without stenosis. Atheromatous irregularity seen about the right carotid bulb/proximal right ICA with no more than mild 20% stenosis by NASCET criteria. Right ICA patent distally without stenosis, evidence for dissection, or occlusion. Left carotid system: Left CCA patent from its origin to the bifurcation without stenosis. Atheromatous irregularity with up to 70% short-segment stenosis at the origin of the left ICA (series 1060, image 3). Left ICA patent distally without additional stenosis, evidence for dissection or occlusion. Vertebral arteries: Both vertebral arteries arise from subclavian arteries. Vertebral arteries are patent without hemodynamically significant stenosis. No evidence for dissection or occlusion. Other: None IMPRESSION: MRI HEAD: 1. 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 Given the various vascular distributions involved, a central thromboembolic etiology is suspected. 2. Underlying moderate chronic microvascular ischemic disease with a few scattered remote lacunar infarcts involving the left thalamus and pons. MRA HEAD: 1. Technically limited exam due to motion artifact. 2. Negative intracranial MRA for large vessel occlusion. Intracranial atherosclerotic disease with associated moderate stenoses involving the distal left V4  segment, distal basilar artery, and cavernous right ICA as above. 3. 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 MRA NECK: 1. Technically limited exam due to motion artifact. 2. Approximate 70% atheromatous stenosis at the origin of the left ICA. 3. Otherwise negative MRA of the neck. No other hemodynamically significant or correctable stenosis identified. Electronically Signed   By: Jeannine Boga M.D.   On: 09/28/2020  03:18   DG Chest Portable 1 View  Result Date: 09/27/2020 CLINICAL DATA:  Code STEMI.  Shortness of breath and recent MI. EXAM: PORTABLE CHEST 1 VIEW COMPARISON:  09/23/2020 FINDINGS: The heart size and mediastinal contours are within normal limits. Both lungs are clear. The visualized skeletal structures are unremarkable. IMPRESSION: No active disease. Electronically Signed   By: Lucienne Capers M.D.   On: 09/27/2020 20:02   ECHOCARDIOGRAM COMPLETE  Result Date: 09/28/2020    ECHOCARDIOGRAM REPORT   Patient Name:   Tracey Meyer Date of Exam: 09/28/2020 Medical Rec #:  [id number]          Height:       64.0 in Accession #:    [id number]         Weight:       175.0 lb Date of Birth:  1960-08-13          BSA:          1.848 m Patient Age:    13 years           BP:           139/78 mmHg Patient Gender: F                  HR:           62 bpm. Exam Location:  Inpatient Procedure: 2D Echo, Cardiac Doppler and Color Doppler Indications:    CHF  History:        Patient has prior history of Echocardiogram examinations, most                 recent 09/22/2020. Risk Factors:Diabetes, Dyslipidemia and Current                 Smoker.  Sonographer:    Cammy Brochure Referring Phys: [place]  1. Left ventricular ejection fraction, by estimation, is 30 to 35%. The left ventricle has moderately decreased function. The left ventricle demonstrates regional wall motion abnormalities (see scoring diagram/findings for description). There is mild left  ventricular hypertrophy. Left ventricular diastolic parameters are consistent with Grade II diastolic dysfunction (pseudonormalization). Elevated left atrial pressure.  2. No LV thrombus seen  3. Right ventricular systolic function is normal. The right ventricular size is normal. Tricuspid regurgitation signal is inadequate for assessing PA pressure.  4. The mitral valve is normal in structure. Trivial mitral valve regurgitation.  5. The aortic valve was not well visualized. Aortic valve regurgitation is not visualized. No aortic stenosis is present.  6. The inferior vena cava is normal in size with greater than 50% respiratory variability, suggesting right atrial pressure of 3 mmHg. FINDINGS  Left Ventricle: Left ventricular ejection fraction, by estimation, is 30 to 35%. The left ventricle has moderately decreased function. The left ventricle demonstrates regional wall motion abnormalities. The left ventricular internal cavity size was normal in size. There is mild left ventricular hypertrophy. Left ventricular diastolic parameters are consistent with Grade II diastolic dysfunction (pseudonormalization). Elevated left atrial pressure.  LV Wall Scoring: The mid and distal anterior wall, mid and distal anterior septum, mid inferoseptal segment, and apex are akinetic. The apical inferior segment is hypokinetic. The entire lateral wall, inferior wall, basal anteroseptal segment, basal anterior segment, and basal inferoseptal segment are normal. Right Ventricle: The right ventricular size is normal. No increase in right ventricular wall thickness. Right ventricular systolic function is normal. Tricuspid regurgitation signal is inadequate for assessing PA pressure. Left Atrium:  Left atrial size was normal in size. Right Atrium: Right atrial size was normal in size. Pericardium: There is no evidence of pericardial effusion. Mitral Valve: The mitral valve is normal in structure. Trivial mitral valve regurgitation.  Tricuspid Valve: The tricuspid valve is normal in structure. Tricuspid valve regurgitation is trivial. Aortic Valve: The aortic valve was not well visualized. Aortic valve regurgitation is not visualized. No aortic stenosis is present. Aortic valve mean gradient measures 3.0 mmHg. Aortic valve peak gradient measures 5.7 mmHg. Aortic valve area, by VTI measures 1.73 cm. Pulmonic Valve: The pulmonic valve was not well visualized. Pulmonic valve regurgitation is not visualized. Aorta: The aortic root and ascending aorta are structurally normal, with no evidence of dilitation. Venous: The inferior vena cava is normal in size with greater than 50% respiratory variability, suggesting right atrial pressure of 3 mmHg. IAS/Shunts: The interatrial septum was not well visualized.  LEFT VENTRICLE PLAX 2D LVIDd:         5.10 cm  Diastology LVIDs:         3.80 cm  LV e' medial:    4.46 cm/s LV PW:         1.20 cm  LV E/e' medial:  19.5 LV IVS:        1.10 cm  LV e' lateral:   3.59 cm/s LVOT diam:     1.80 cm  LV E/e' lateral: 24.2 LV SV:         41 LV SV Index:   22 LVOT Area:     2.54 cm  RIGHT VENTRICLE RV Basal diam:  3.40 cm RV S prime:     7.94 cm/s LEFT ATRIUM             Index       RIGHT ATRIUM           Index LA diam:        3.70 cm 2.00 cm/m  RA Area:     10.50 cm LA Vol (A2C):   37.1 ml 20.07 ml/m RA Volume:   24.00 ml  12.98 ml/m LA Vol (A4C):   48.9 ml 26.45 ml/m LA Biplane Vol: 43.4 ml 23.48 ml/m  AORTIC VALVE AV Area (Vmax):    1.65 cm AV Area (Vmean):   1.59 cm AV Area (VTI):     1.73 cm AV Vmax:           119.00 cm/s AV Vmean:          75.400 cm/s AV VTI:            0.235 m AV Peak Grad:      5.7 mmHg AV Mean Grad:      3.0 mmHg LVOT Vmax:         77.00 cm/s LVOT Vmean:        47.200 cm/s LVOT VTI:          0.160 m LVOT/AV VTI ratio: 0.68  AORTA Ao Root diam: 2.60 cm Ao Asc diam:  3.30 cm MITRAL VALVE               TRICUSPID VALVE MV Area (PHT): 4.06 cm    TR Peak grad:   19.7 mmHg MV Decel Time: 187  msec    TR Vmax:        222.00 cm/s MV E velocity: 87.00 cm/s MV A velocity: 60.80 cm/s  SHUNTS MV E/A ratio:  1.43        Systemic VTI:  0.16 m  Systemic Diam: 1.80 cm Oswaldo Milian MD Electronically signed by Oswaldo Milian MD Signature Date/Time: 09/28/2020/4:34:28 PM    Final    ABORTED INVASIVE LAB PROCEDURE  Result Date: 09/28/2020 This case was aborted.     Scheduled Meds:   stroke: mapping our early stages of recovery book   Does not apply Once   aspirin EC  81 mg Oral Daily   enoxaparin (LOVENOX) injection  40 mg Subcutaneous Q24H   insulin aspart  0-5 Units Subcutaneous QHS   insulin aspart  0-9 Units Subcutaneous TID WC   rosuvastatin  40 mg Oral Daily   ticagrelor  90 mg Oral BID   Continuous Infusions:   LOS: 2 days      Debbe Odea, MD [facility] Pager: [email or web address] 09/29/2020, 3:48 PM

## 2020-09-30 DIAGNOSIS — Z720 Tobacco use: Secondary | ICD-10-CM

## 2020-09-30 LAB — GLUCOSE, CAPILLARY
Glucose-Capillary: 124 mg/dL — ABNORMAL HIGH (ref 70–99)
Glucose-Capillary: 134 mg/dL — ABNORMAL HIGH (ref 70–99)
Glucose-Capillary: 129 mg/dL — ABNORMAL HIGH (ref 70–99)
Glucose-Capillary: 146 mg/dL — ABNORMAL HIGH (ref 70–99)

## 2020-09-30 MED ORDER — SODIUM CHLORIDE 0.9 % IV SOLN: INTRAVENOUS | Status: DC

## 2020-09-30 NOTE — PMR Pre-admission (Signed)
PMR Admission Coordinator Pre-Admission Assessment  Patient: Tracey Meyer is an 60 y.o., female MRN: [id number] DOB: 1960-05-06 Height: '5\' 4"'  (162.6 cm) Weight: 69.5 kg  Insurance Information     PRIMARY: Uninsured      SECONDARY:       Policy#:      Phone#:   Development worker, community:       Phone#:   The Engineer, [employer]" for patients in Inpatient Rehabilitation Facilities with attached "Privacy Act [place] Records" was provided and verbally reviewed with: Patient  Emergency Contact Information Contact Information     Name Relation Home Work Mobile   REID,LEONARD Significant other   [phone]   Rosaura Carpenter   [phone]   Radene Journey Sister [phone]       Current Medical History  Patient Admitting Diagnosis CVA History of Present Illness:Tracey Meyer is a 60 y/o F with CAD (recent stent to the LAD on 7/4), ischemic cardiomyopathy with EF of 35-40%, HTN, DM2, HLD and smoking who presented to the ED with shortness of breath and orthopnea. She also stated that on the evening of 7/4, she developed left sided weakness and slurred speech which did not improve. She presented to the ED 7/7 and  CT showed 18 x 16 x 17 mm focus of peripheral hypo attenuation in the posterior right parietooccipital region. MRI showed multiple  scattered acute to early subacute ischemic nonhemorrhagic infarcts involving the bilateral cerebral and cerebellar hemispheres as above, with largest area of infarction measuring up to 2 cm at the right occipital cortex. MRA head technically limited due to motion artifact, negative for LVO.  Intracranial atherosclerotic disease with associated moderate stneoses involving the distal L V4 segment, distal basilar artery, and carvernous R ICA.  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 MRA neck limited due to motion artifact, 70% stenosis at origin of L ICA.  Otherwise  negative MRA of neck.CIR was consulted to assist in return to PLOF.   Complete NIHSS TOTAL: 8  Patient's medical [facility] from [facility] has been reviewed by the rehabilitation admission coordinator and physician.  Past Medical History  Past Medical History:  Diagnosis Date   Cardiomyopathy, ischemic 09/24/2020   DM (diabetes mellitus), type 2 ([place]) 09/24/2020   HTN (hypertension)    Hyperlipidemia    Prediabetes    S/P angioplasty with stent to mLAD 09/22/20  09/24/2020   Tobacco abuse 09/24/2020   Transaminitis 09/24/2020    Family History   family history includes Cancer in her mother; Heart disease in her brother.  Prior Rehab/Hospitalizations Has the patient had prior rehab or hospitalizations prior to admission? Yes  Has the patient had major surgery during 100 days prior to admission? Yes   Current Medications  Current Facility-Administered Medications:     stroke: mapping our early stages of recovery book, , Does not apply, Once, Alcario Drought, Jared M, DO   acetaminophen (TYLENOL) tablet 650 mg, 650 mg, Oral, Q4H PRN **OR** acetaminophen (TYLENOL) 160 MG/5ML solution 650 mg, 650 mg, Per Tube, Q4H PRN **OR** acetaminophen (TYLENOL) suppository 650 mg, 650 mg, Rectal, Q4H PRN, Alcario Drought, Jared M, DO   alum & mag hydroxide-simeth (MAALOX/MYLANTA) 200-200-20 MG/5ML suspension 30 mL, 30 mL, Oral, Q6H PRN, Rizwan, Saima, MD, 30 mL at 09/29/20 1848   aspirin EC tablet 81 mg, 81 mg, Oral, Daily, Alcario Drought, Jared M, DO, 81 mg at 09/29/20 0903   enoxaparin (LOVENOX) injection 40 mg, 40 mg,  Subcutaneous, Q24H, Alcario Drought, Jared M, DO, 40 mg at 09/29/20 0370   insulin aspart (novoLOG) injection 0-5 Units, 0-5 Units, Subcutaneous, QHS, Alcario Drought, Jared M, DO   insulin aspart (novoLOG) injection 0-9 Units, 0-9 Units, Subcutaneous, TID WC, Alcario Drought, Jared M, DO, 1 Units at 09/29/20 1827   rosuvastatin (CRESTOR) tablet 40 mg, 40 mg, Oral, Daily, Renae Fickle, MD, 40 mg at 09/29/20 4888    ticagrelor (BRILINTA) tablet 90 mg, 90 mg, Oral, BID, Etta Quill, DO, 90 mg at 09/29/20 2128  Patients Current Diet:  Diet Order             Diet heart healthy/carb modified Room service appropriate? No; Fluid consistency: Thin  Diet effective now                   Precautions / Restrictions Precautions Precautions: Fall Precaution Comments: L hemiplegia Restrictions Weight Bearing Restrictions: No   Has the patient had 2 or more falls or a fall with injury in the past year? Yes  Prior Activity Level    Prior Functional Level Self Care: Did the patient need help bathing, dressing, using the toilet or eating? Independent  Indoor Mobility: Did the patient need assistance with walking from room to room (with or without device)? Independent  Stairs: Did the patient need assistance with internal or external stairs (with or without device)? Independent  Functional Cognition: Did the patient need help planning regular tasks such as shopping or remembering to take medications? [place] Devices/Equipment: Cane (specify quad or straight) (straight cane) Home Equipment: Cane - single point  Prior Device Use: Indicate devices/aids used by the patient prior to current illness, exacerbation or injury? None of the above  Current Functional Level Cognition  Overall Cognitive Status: Impaired/Different from baseline Orientation Level: Oriented X4 Following Commands: Follows one step commands with increased time Safety/Judgement: Decreased awareness of safety, Decreased awareness of deficits General Comments: Pt with difficulty following commands during visual assessment, requires repeated VC for directions to perform correctly. Step-by-step sequencing cues for mobility tasks    Extremity Assessment (includes Sensation/Coordination)  Upper Extremity Assessment: LUE deficits/detail LUE Deficits / Details: flaccid LUE  Sensation: WNL LUE Coordination: decreased fine motor, decreased gross motor  Lower Extremity Assessment: Defer to PT evaluation LLE Deficits / Details: 1/5 knee flexors; all other motions 0/5 LLE Sensation: WNL    ADLs  Overall ADL's : Needs assistance/impaired Grooming: Wash/dry hands, Moderate assistance, Sitting Upper Body Bathing: Bed level, Moderate assistance Lower Body Bathing: Maximal assistance, Bed level Upper Body Dressing : Moderate assistance, Sitting Lower Body Dressing: Moderate assistance, Bed level Toilet Transfer: +2 for physical assistance, Moderate assistance Toileting- Clothing Manipulation and Hygiene: Sitting/lateral lean, Minimal assistance Toileting - Clothing Manipulation Details (indicate cue type and reason): thoroughness Functional mobility during ADLs: Moderate assistance, +2 for physical assistance    Mobility  Overal bed mobility: Needs Assistance Bed Mobility: Supine to Sit, Sit to Supine Supine to sit: Min assist, +2 for physical assistance Sit to supine: Min assist, +2 for physical assistance General bed mobility comments: min +2 for LLE/LUE management, trunk elevation, cues for RUE on L bedrail to aide in roll and trunk elevation.    Transfers  Overall transfer level: Needs assistance Equipment used: 2 person hand held assist Transfers: Sit to/from Stand, Stand Pivot Transfers Sit to Stand: Mod assist, +2 physical assistance Stand pivot transfers: Mod assist, +2 physical assistance  Lateral/Scoot Transfers: Mod assist, +2 physical assistance  General transfer comment: Mod +2 for power up, rise, steady, correct posture to upright as pt with heavy R lateral leaning, pivot towards R to BSC and recliner, respectively. Pt with difficulty progressing LLE and + buckling, requiring max LE assist. STS x2, stand pivot x2.    Ambulation / Gait / Stairs / Wheelchair Mobility  Ambulation/Gait General Gait Details: NT on eval    Posture / Balance Dynamic  Sitting Balance Sitting balance - Comments: EOB sitting statically at supervision level Balance Overall balance assessment: Needs assistance Sitting-balance support: Feet supported Sitting balance-Leahy Scale: Fair Sitting balance - Comments: EOB sitting statically at supervision level Standing balance support: Bilateral upper extremity supported, During functional activity Standing balance-Leahy Scale: Poor Standing balance comment: reliant on PT/OT assist    Special needs/care consideration none   Previous Home Environment (from acute therapy documentation) Living Arrangements: Spouse/significant other  Lives With: Spouse Available Help at Discharge: Family, Available 24 hours/day Type of Home: Mobile home Home Layout: One level Home Access: Stairs to enter Technical brewer of Steps: 3 Bathroom Shower/Tub: Chiropodist: Standard Bathroom Accessibility: Yes How Accessible: Accessible via walker [place]: No  Discharge Living Setting Plans for Discharge Living Setting: Patient's home Type of Home at Discharge: Mobile home Discharge Home Layout: One level Discharge Home Access: Stairs to enter Entrance Stairs-Rails: None Discharge Bathroom Shower/Tub: Tub/shower unit Discharge Bathroom Toilet: Standard Discharge Bathroom Accessibility: Yes How Accessible: Accessible via walker Does the patient have any problems obtaining your medications?: No  Social/Family/Support Systems Patient Roles: Partner Contact Information: [phone] Anticipated Caregiver: [name] Anticipated Caregiver's Contact Information: [phone] Caregiver Availability: 24/7 Discharge Plan Discussed with Primary Caregiver: Yes Is Caregiver In Agreement with Plan?: Yes  Goals Patient/Family Goal for Rehab: PT/OT/SLP min A Expected length of stay: 19-21 days Pt/Family Agrees to Admission and willing to participate: Yes Program Orientation Provided & Reviewed  with Pt/Caregiver Including Roles  & Responsibilities: Yes  Decrease burden of Care through IP rehab admission: Specialzed equipment needs, Decrease number of caregivers, Bowel and bladder program, and Patient/family education  Possible need for SNF placement upon discharge: not anticipated   Patient Condition: I have reviewed medical records from [facility], spoken with CM, and patient. I met with patient at the bedside for inpatient rehabilitation assessment.  Patient will benefit from ongoing PT and OT, can actively participate in 3 hours of therapy a day 5 days of the week, and can make measurable gains during the admission.  Patient will also benefit from the coordinated team approach during an Inpatient Acute Rehabilitation admission.  The patient will receive intensive therapy as well as Rehabilitation physician, nursing, social worker, and care management interventions.  Due to safety, skin/wound care, medication administration, pain management, and patient education the patient requires 24 hour a day rehabilitation nursing.  The patient is currently min-mod A with mobility and basic ADLs.  Discharge setting and therapy post discharge at home with home health is anticipated.  Patient has agreed to participate in the Acute Inpatient Rehabilitation Program and will admit today.  Preadmission Screen Completed By:  Genella Mech, 09/30/2020 10:39 AM ______________________________________________________________________   Discussed status with Dr. Dagoberto Ligas  on 10/09/20 at 72 and received approval for admission today.  Admission Coordinator:  Genella Mech, CCC-SLP, time 009 /Date 10/09/20   Assessment/Plan: Diagnosis:multiple bi-cerebral infarcts, largest in right parietal occipital cortex Does the need for close, 24 hr/day Medical supervision in concert with the patient's rehab needs make it  unreasonable for this patient to be served in a less intensive setting?  Yes Co-Morbidities requiring supervision/potential complications: CAD, CM, HTN, DM Due to bladder management, bowel management, safety, skin/wound care, disease management, medication administration, pain management, and patient education, does the patient require 24 hr/day rehab nursing? Yes Does the patient require coordinated care of a physician, rehab nurse, PT, OT, and SLP to address physical and functional deficits in the context of the above medical diagnosis(es)? Yes Addressing deficits in the following areas: balance, endurance, locomotion, strength, transferring, bowel/bladder control, bathing, dressing, feeding, grooming, toileting, cognition, speech, swallowing, and psychosocial support Can the patient actively participate in an intensive therapy program of at least 3 hrs of therapy 5 days a week? Yes The potential for patient to make measurable gains while on inpatient rehab is excellent Anticipated functional outcomes upon discharge from inpatient rehab: min assist PT, min assist OT, min assist SLP Estimated rehab length of stay to reach the above functional goals is: 19-21 days Anticipated discharge destination: Home 10. Overall Rehab/Functional Prognosis: excellent   MD Signature: Meredith Staggers, MD, [place] [facility] 10/09/2020

## 2020-09-30 NOTE — Plan of Care (Signed)
  Problem: Clinical Measurements: Goal: Ability to maintain clinical measurements within normal limits will improve Outcome: Progressing Goal: Respiratory complications will improve Outcome: Progressing   Problem: Activity: Goal: Risk for activity intolerance will decrease Outcome: Progressing   Problem: Nutrition: Goal: Adequate nutrition will be maintained Outcome: Progressing   Problem: Coping: Goal: Level of anxiety will decrease Outcome: Progressing

## 2020-09-30 NOTE — Progress Notes (Signed)
PROGRESS NOTE    ALANNAH AVERHART   OZY:[id number]  DOB: 11/11/1960  DOA: 09/27/2020 PCP: Pcp, No   Brief Narrative:  LAKIYA COTTAM is a 60 y/o F with CAD (recent stent to the LAD on 7/4), ischemic cardiomyopathy with EF of 35-40%, HTN, DM2, HLD and smoking who presented to the ED with shortness of breath and orthopnea.  She also stated that on the evening of 7/4, she developed left sided weakness and slurred speech which did not improve.   In ED: CT head> 18 x 16 x 17 mm focus of peripheral hypo attenuation in the posterior right parietooccipital region. This is nonspecific and could be related to subacute infarct, but given the somewhat rounded configuration, MRI of the brain without and with contrast recommended to further evaluate.  Cardiology and neurology consulted. IV Lasix started and MRI/ MRA ordered. Admitted to treat acute CHF and work up neurological symptoms.  Subjective:  No new complaints. Eating, drinking, voiding well.     Assessment & Plan:   Principal Problem:   Acute ischemic strokes- multifocal and bilateral   Hyperlipidemia -symptoms occurred on the night of 7/4 after she got home from the hospital - MRI > Multiple scattered acute to early subacute ischemic nonhemorrhagic infarcts involving the bilateral cerebral and cerebellar hemispheres as above, with largest area of infarction measuring up to 2 cm at the right occipital cortex. Given the various vascular distributions involved, a central thromboembolic etiology is suspected. - CTA neck showed Left ICA 70% stenosis -LDL is 130-Crestor started after MI-being increased from 10 mg this admission to 40 mg daily -A1c is 6.8 -2D echo does not reveal a thrombus any thrombus -Neurology recommending to continue 81 mg aspirin and Brilinta -TEE recommended by neurology and has been ordered by cardiology - if TEE unrevealing, 30 day event monitor recommended -Therapy recommends CIR-I have consulted  them - SLP eval done- mild aspiration risk- regular diet with thin liquid recommended and MBS ordered - SLUMs is 17/30  Active Problems: Left ICA stenosis - MRA>> Approximate 70% atheromatous stenosis at the origin of the left ICA. - will ask for vascular to f/u as outpt  Chronic systolic heart failure (mild) CAD with NSTEMI and stent in mid LAD on 09/22/2020 -Echo 09/22/2020> EF 35 to 40%, mild LVH, grade 1 diastolic dysfunction, severe hypokinesis of the LV entire anterior wall anteroseptal wall apical segment and inferior apical segment. -Echo 09/28/2020> EF 30 to 04%, grade 2 diastolic dysfunction -Given 20 mg of IV Lasix on 7/7 and no diuretics since - Continue aspirin, Brilinta, Crestor -Antihypertensives being held to allow for permissive hypertension -Cardiology is following  Urinary retention - Patient had the urge to urinate but was unable to-bladder scan revealed 700 cc - In-N-Out cath done and 600 cc of urine - has been voiding since  CKD 3B - GFR ranges from 40s to 60s and is at baseline    DM (diabetes mellitus), type 2 ([place]) -On metformin at home- sugars < 150 here in the hospital - she is eating most of her meals -Hemoglobin A1C    Component Value Date/Time   HGBA1C 6.8 (H) 09/28/2020 0403   Tobacco abuse - Smoking cessation counseling given  Time spent in minutes: 35 DVT prophylaxis: enoxaparin (LOVENOX) injection 40 mg Start: 09/28/20 0800  Code Status: full code Family Communication:  Level of Care: Level of care: Telemetry Cardiac Disposition Plan:  Status is: Inpatient  Remains inpatient appropriate because:Inpatient level of care appropriate due  to severity of illness  Dispo: The patient is from: Home              Anticipated d/c is to:  [facility]              Patient currently is not medically stable to d/c.   Difficult to place patient Yes      Consultants:  Cardiology Neurology  Procedures:  none Antimicrobials:  Anti-infectives (From  admission, onward)    None        Objective: Vitals:   09/30/20 0438 09/30/20 0737 09/30/20 1125 09/30/20 1607  BP: (!) 165/87 135/65 (!) 155/81 135/87  Pulse: 82 82  79  Resp: 14 20 18 18   Temp: 98.4 F (36.9 C) 98.2 F (36.8 C) 98.1 F (36.7 C) 98.7 F (37.1 C)  TempSrc: Oral Oral Oral Oral  SpO2:      Weight: 69.5 kg     Height:        Intake/Output Summary (Last 24 hours) at 09/30/2020 1729 Last data filed at 09/30/2020 0438 Gross per 24 hour  Intake --  Output 350 ml  Net -350 ml    Filed Weights   09/28/20 1415 09/29/20 0421 09/30/20 0438  Weight: 69.9 kg 70.4 kg 69.5 kg    Examination: General exam: Appears comfortable  HEENT: PERRLA, oral mucosa moist, no sclera icterus or thrush Respiratory system: Clear to auscultation. Respiratory effort normal. Cardiovascular system: S1 & S2 heard, regular rate and rhythm Gastrointestinal system: Abdomen soft, non-tender, nondistended. Normal bowel sounds   Central nervous system: Alert and oriented.0/5 strength in left side Extremities: No cyanosis, clubbing or edema Skin: No rashes or ulcers Psychiatry:  Mood & affect appropriate.      Data Reviewed: I have personally reviewed following labs and imaging studies  CBC: Recent Labs  Lab 09/24/20 0317 09/27/20 1900 09/27/20 1950  WBC 10.3 6.9  --   NEUTROABS  --  4.9  --   HGB 13.1 12.1 12.2  HCT 38.5 36.4 36.0  MCV 91.7 94.3  --   PLT 287 352  --     Basic Metabolic Panel: Recent Labs  Lab 09/24/20 0317 09/27/20 1900 09/27/20 1950 09/29/20 0912  NA 133* 134* 138 135  K 3.4* 3.8 3.8 4.1  CL 104 104 108 104  CO2 21* 18*  --  22  GLUCOSE 142* 205* 201* 145*  BUN 27* 26* 26* 24*  CREATININE 1.09* 1.30* 1.20* 1.10*  CALCIUM 9.3 9.7  --  9.6    GFR: Estimated Creatinine Clearance: 52.7 mL/min (A) (by C-G formula based on SCr of 1.1 mg/dL (H)). Liver Function Tests: Recent Labs  Lab 09/24/20 0317 09/27/20 1900  AST 163* 28  ALT 55* 26   ALKPHOS 48 47  BILITOT 1.1 0.5  PROT 6.8 6.8  ALBUMIN 2.9* 3.0*    No results for input(s): LIPASE, AMYLASE in the last 168 hours. No results for input(s): AMMONIA in the last 168 hours. Coagulation Profile: Recent Labs  Lab 09/27/20 1900  INR 1.0    Cardiac Enzymes: No results for input(s): CKTOTAL, CKMB, CKMBINDEX, TROPONINI in the last 168 hours. BNP (last 3 results) No results for input(s): PROBNP in the last 8760 hours. HbA1C: Recent Labs    09/28/20 0403  HGBA1C 6.8*    CBG: Recent Labs  Lab 09/29/20 1731 09/29/20 2120 09/30/20 0733 09/30/20 1155 09/30/20 1606  GLUCAP 145* 144* 124* 134* 129*    Lipid Profile: Recent Labs    09/28/20  0404  CHOL 186  HDL 36*  LDLCALC 130*  TRIG 100  CHOLHDL 5.2    Thyroid Function Tests: No results for input(s): TSH, T4TOTAL, FREET4, T3FREE, THYROIDAB in the last 72 hours. Anemia Panel: No results for input(s): VITAMINB12, FOLATE, FERRITIN, TIBC, IRON, RETICCTPCT in the last 72 hours. Urine analysis:    Component Value Date/Time   COLORURINE YELLOW 09/28/2020 1528   APPEARANCEUR CLEAR 09/28/2020 1528   LABSPEC 1.020 09/28/2020 1528   PHURINE 5.5 09/28/2020 1528   GLUCOSEU NEGATIVE 09/28/2020 1528   HGBUR NEGATIVE 09/28/2020 [place] 09/28/2020 [place] 09/28/2020 1528   PROTEINUR NEGATIVE 09/28/2020 1528   NITRITE POSITIVE (A) 09/28/2020 1528   LEUKOCYTESUR NEGATIVE 09/28/2020 1528   Sepsis Labs: @LABRCNTIP (procalcitonin:4,lacticidven:4) ) Recent Results (from the past 240 hour(s))  Resp Panel by RT-PCR (Flu A&B, Covid) Nasopharyngeal Swab     Status: None   Collection Time: 09/22/20  9:18 AM   Specimen: Nasopharyngeal Swab; Nasopharyngeal(NP) swabs in vial transport medium  Result Value Ref Range Status   SARS Coronavirus 2 by RT PCR NEGATIVE NEGATIVE Final    Comment: (NOTE) SARS-CoV-2 target nucleic acids are NOT DETECTED.  The SARS-CoV-2 RNA is generally  detectable in upper respiratory specimens during the acute phase of infection. The lowest concentration of SARS-CoV-2 viral copies this assay can detect is 138 copies/mL. A negative result does not preclude SARS-Cov-2 infection and should not be used as the sole basis for treatment or other patient management decisions. A negative result may occur with  improper specimen collection/handling, submission of specimen other than nasopharyngeal swab, presence of viral mutation(s) within the areas targeted by this assay, and inadequate number of viral copies(<138 copies/mL). A negative result must be combined with clinical observations, patient history, and epidemiological information. The expected result is Negative.  Fact Sheet for Patients:  EntrepreneurPulse.com.au  Fact Sheet for Healthcare Providers:  IncredibleEmployment.be  This test is no t yet approved or cleared by the [place] FDA and  has been authorized for detection and/or diagnosis of SARS-CoV-2 by FDA under an Emergency Use Authorization (EUA). This EUA will remain  in effect (meaning this test can be used) for the duration of the COVID-19 declaration under Section 564(b)(1) of the Act, 21 U.S.C.section 360bbb-3(b)(1), unless the authorization is terminated  or revoked sooner.       Influenza A by PCR NEGATIVE NEGATIVE Final   Influenza B by PCR NEGATIVE NEGATIVE Final    Comment: (NOTE) The Xpert Xpress SARS-CoV-2/FLU/RSV plus assay is intended as an aid in the diagnosis of influenza from Nasopharyngeal swab specimens and should not be used as a sole basis for treatment. Nasal washings and aspirates are unacceptable for Xpert Xpress SARS-CoV-2/FLU/RSV testing.  Fact Sheet for Patients: EntrepreneurPulse.com.au  Fact Sheet for Healthcare Providers: IncredibleEmployment.be  This test is not yet approved or cleared by the [place] FDA  and has been authorized for detection and/or diagnosis of SARS-CoV-2 by FDA under an Emergency Use Authorization (EUA). This EUA will remain in effect (meaning this test can be used) for the duration of the COVID-19 declaration under Section 564(b)(1) of the Act, 21 U.S.C. section 360bbb-3(b)(1), unless the authorization is terminated or revoked.  Performed at [facility], [place] [address] [place], [place] [address]   MRSA Next Gen by PCR, Nasal     Status: None   Collection Time: 09/22/20 10:59 AM   Specimen: Nasal Mucosa; Nasal Swab  Result Value Ref Range  Status   MRSA by PCR Next Gen NOT DETECTED NOT DETECTED Final    Comment: (NOTE) The GeneXpert MRSA Assay (FDA approved for NASAL specimens only), is one component of a comprehensive MRSA colonization surveillance program. It is not intended to diagnose MRSA infection nor to guide or monitor treatment for MRSA infections. Test performance is not FDA approved in patients less than 58 years old. Performed at [facility], [place] [address] [place], [place] [address]   Resp Panel by RT-PCR (Flu A&B, Covid) Nasopharyngeal Swab     Status: None   Collection Time: 09/27/20  6:54 PM   Specimen: Nasopharyngeal Swab; Nasopharyngeal(NP) swabs in vial transport medium  Result Value Ref Range Status   SARS Coronavirus 2 by RT PCR NEGATIVE NEGATIVE Final    Comment: (NOTE) SARS-CoV-2 target nucleic acids are NOT DETECTED.  The SARS-CoV-2 RNA is generally detectable in upper respiratory specimens during the acute phase of infection. The lowest concentration of SARS-CoV-2 viral copies this assay can detect is 138 copies/mL. A negative result does not preclude SARS-Cov-2 infection and should not be used as the sole basis for treatment or other patient management decisions. A negative result may occur with  improper specimen collection/handling, submission of specimen other than nasopharyngeal swab, presence of viral mutation(s)  within the areas targeted by this assay, and inadequate number of viral copies(<138 copies/mL). A negative result must be combined with clinical observations, patient history, and epidemiological information. The expected result is Negative.  Fact Sheet for Patients:  EntrepreneurPulse.com.au  Fact Sheet for Healthcare Providers:  IncredibleEmployment.be  This test is no t yet approved or cleared by the [place] FDA and  has been authorized for detection and/or diagnosis of SARS-CoV-2 by FDA under an Emergency Use Authorization (EUA). This EUA will remain  in effect (meaning this test can be used) for the duration of the COVID-19 declaration under Section 564(b)(1) of the Act, 21 U.S.C.section 360bbb-3(b)(1), unless the authorization is terminated  or revoked sooner.       Influenza A by PCR NEGATIVE NEGATIVE Final   Influenza B by PCR NEGATIVE NEGATIVE Final    Comment: (NOTE) The Xpert Xpress SARS-CoV-2/FLU/RSV plus assay is intended as an aid in the diagnosis of influenza from Nasopharyngeal swab specimens and should not be used as a sole basis for treatment. Nasal washings and aspirates are unacceptable for Xpert Xpress SARS-CoV-2/FLU/RSV testing.  Fact Sheet for Patients: EntrepreneurPulse.com.au  Fact Sheet for Healthcare Providers: IncredibleEmployment.be  This test is not yet approved or cleared by the [place] FDA and has been authorized for detection and/or diagnosis of SARS-CoV-2 by FDA under an Emergency Use Authorization (EUA). This EUA will remain in effect (meaning this test can be used) for the duration of the COVID-19 declaration under Section 564(b)(1) of the Act, 21 U.S.C. section 360bbb-3(b)(1), unless the authorization is terminated or revoked.  Performed at [facility], [place] [address], [place], [place] [address]           Radiology Studies: No results  found.    Scheduled Meds:   stroke: mapping our early stages of recovery book   Does not apply Once   aspirin EC  81 mg Oral Daily   enoxaparin (LOVENOX) injection  40 mg Subcutaneous Q24H   insulin aspart  0-5 Units Subcutaneous QHS   insulin aspart  0-9 Units Subcutaneous TID WC   rosuvastatin  40 mg Oral Daily   ticagrelor  90 mg Oral BID  Continuous Infusions:  sodium chloride       LOS: 3 days      Debbe Odea, MD [facility] Pager: [email or web address] 09/30/2020, 5:29 PM

## 2020-09-30 NOTE — Progress Notes (Signed)
Progress Note  Patient Name: Tracey Meyer Date of Encounter: 09/30/2020  [facility] Cardiologist: None   Patient Profile     60 y.o. 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  Neuro recommends "permissive Hypertension" concerning for cardioembolic  Subjective   Some difficulty swallowing and getting sob when eating    Inpatient Medications    Scheduled Meds:   stroke: mapping our early stages of recovery book   Does not apply Once   aspirin EC  81 mg Oral Daily   enoxaparin (LOVENOX) injection  40 mg Subcutaneous Q24H   insulin aspart  0-5 Units Subcutaneous QHS   insulin aspart  0-9 Units Subcutaneous TID WC   rosuvastatin  40 mg Oral Daily   ticagrelor  90 mg Oral BID   Continuous Infusions:  PRN Meds: acetaminophen **OR** acetaminophen (TYLENOL) oral liquid 160 mg/5 mL **OR** acetaminophen, alum & mag hydroxide-simeth   Vital Signs    Vitals:   09/29/20 1959 09/30/20 0040 09/30/20 0438 09/30/20 0737  BP: 129/84 (!) 152/71 (!) 165/87 135/65  Pulse: 72 72 82 82  Resp: 20 14 14 20   Temp: 98.7 F (37.1 C) 98.2 F (36.8 C) 98.4 F (36.9 C) 98.2 F (36.8 C)  TempSrc: Oral Oral Oral Oral  SpO2: 97% 94%    Weight:   69.5 kg   Height:        Intake/Output Summary (Last 24 hours) at 09/30/2020 0921 Last data filed at 09/30/2020 0438 Gross per 24 hour  Intake --  Output 900 ml  Net -900 ml    Last 3 Weights 09/30/2020 09/29/2020 09/28/2020  Weight (lbs) 153 lb 3.5 oz 155 lb 3.3 oz 154 lb 1.6 oz  Weight (kg) 69.5 kg 70.4 kg 69.9 kg      Telemetry    sinus Personally Reviewed  Exam   Well developed and nourished in no acute distress HENT normal Neck supple with JVP-  Clear Regular rate and rhythm, no murmurs or gallops Abd-soft with active BS No Clubbing cyanosis edema Skin-warm  and dry A & Oriented no movement of L hand foot      Labs    High Sensitivity Troponin:   Recent Labs  Lab 09/22/20 1233 09/27/20 1900 09/27/20 2116  TROPONINIHS >24,000* 15,211* 13,694*       Chemistry Recent Labs  Lab 09/24/20 0317 09/27/20 1900 09/27/20 1950 09/29/20 0912  NA 133* 134* 138 135  K 3.4* 3.8 3.8 4.1  CL 104 104 108 104  CO2 21* 18*  --  22  GLUCOSE 142* 205* 201* 145*  BUN 27* 26* 26* 24*  CREATININE 1.09* 1.30* 1.20* 1.10*  CALCIUM 9.3 9.7  --  9.6  PROT 6.8 6.8  --   --   ALBUMIN 2.9* 3.0*  --   --   AST 163* 28  --   --   ALT 55* 26  --   --   ALKPHOS 48 47  --   --   BILITOT 1.1 0.5  --   --   GFRNONAA 59* 47*  --  58*  ANIONGAP 8 12  --  9      Hematology Recent Labs  Lab 09/24/20 0317 09/27/20 1900 09/27/20 1950  WBC 10.3 6.9  --   RBC 4.20 3.86*  --  HGB 13.1 12.1 12.2  HCT 38.5 36.4 36.0  MCV 91.7 94.3  --   MCH 31.2 31.3  --   MCHC 34.0 33.2  --   RDW 13.3 13.1  --   PLT 287 352  --      BNP Recent Labs  Lab 09/27/20 1916  BNP 724.0*      DDimer No results for input(s): DDIMER in the last 168 hours.   Radiology      Cardiac Studies   Echo 09/22/20:  1. Apex well-visualized without contrast - false tendon (normal variant)  - no thrombus. Left ventricular ejection fraction, by estimation, is 35 to  40%. The left ventricle has moderately decreased function. The left  ventricle demonstrates regional wall  motion abnormalities (see scoring diagram/findings for description). There  is mild left ventricular hypertrophy. Left ventricular diastolic  parameters are consistent with Grade I diastolic dysfunction (impaired  relaxation). Elevated left ventricular  end-diastolic pressure. There is severe hypokinesis of the left  ventricular, entire anterior wall, anteroseptal wall, apical segment and  inferoapical segment. Findings suggest LAD territory ischemia/infarct.   2. Right ventricular systolic function is  normal. The right ventricular  size is normal. There is normal pulmonary artery systolic pressure.   3. The mitral valve is abnormal. Mild mitral valve regurgitation.   4. The aortic valve is tricuspid. Aortic valve regurgitation is not  visualized.   5. The inferior vena cava is normal in size with <50% respiratory  variability, suggesting right atrial pressure of 8 mmHg.  LHC 09/22/20: RPDA lesion is 50% stenosed. Dist Cx lesion is 30% stenosed with 30% stenosed side branch in LPAV. 1st Diag lesion is 60% stenosed. Mid LAD lesion is 100% stenosed. Post intervention, there is a 0% residual stenosis. A drug-eluting stent was successfully placed using a STENT ONYX FRONTIER [id number]. LV end diastolic pressure is mildly elevated.   1. 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 Mildly elevated LVEDP 21 mmHg 3. Successful PCI of the mid LAD with DES x 1   Diagnostic Dominance: Co-dominant      Intervention        _____________    Assessment & Plan    1. Acute CVA:( post cath)   StEMI s/p PCI   ICM  with EF 35-40%   CHF chronic   Hyperlipidemia   Continue DAPT and statins Repeat Echo EF 30-35% w/o evidence of LV clot  Scheduled (orders written) for TEE in am toexclude clot Will take privilege to ask for speech therapy for swallowing    Virl Axe 09/30/20

## 2020-09-30 NOTE — Evaluation (Signed)
Speech Language Pathology Evaluation Patient Details Name: Tracey Meyer MRN: [id number] DOB: [birth year] Today's Date: 09/30/2020 Time: 1440-1456 SLP Time Calculation (min) (ACUTE ONLY): 16 min  Problem List:  Patient Active Problem List   Diagnosis Date Noted   CKD (chronic kidney disease), stage III ([place]) 09/29/2020   Left-sided weakness    Acute ischemic stroke ([place]) 09/27/2020   HFrEF (heart failure with reduced ejection fraction) ([place]) 09/27/2020   Tobacco abuse 09/24/2020   Cardiomyopathy, ischemic 09/24/2020   S/P angioplasty with stent to mLAD 09/22/20  09/24/2020   Transaminitis 09/24/2020   DM (diabetes mellitus), type 2 ([place]) 09/24/2020   Acute ST elevation myocardial infarction (STEMI) due to occlusion of left anterior descending (LAD) coronary artery ([place]) 09/22/2020   HTN (hypertension) 09/22/2020   Hyperlipidemia 09/22/2020   STEMI involving left anterior descending coronary artery ([place]) 09/22/2020   ST elevation myocardial infarction involving left anterior descending (LAD) coronary artery ([place]) 09/22/2020   Past Medical History:  Past Medical History:  Diagnosis Date   Cardiomyopathy, ischemic 09/24/2020   DM (diabetes mellitus), type 2 ([place]) 09/24/2020   HTN (hypertension)    Hyperlipidemia    Prediabetes    S/P angioplasty with stent to mLAD 09/22/20  09/24/2020   Tobacco abuse 09/24/2020   Transaminitis 09/24/2020   Past Surgical History:  Past Surgical History:  Procedure Laterality Date   BREAST SURGERY Left    age 40   CORONARY/GRAFT ACUTE MI REVASCULARIZATION N/A 09/22/2020   Procedure: Coronary/Graft Acute MI Revascularization;  Surgeon: Martinique, Peter M, MD;  Location: [facility];  Service: Cardiovascular;  Laterality: N/A;   LEFT HEART CATH AND CORONARY ANGIOGRAPHY N/A 09/22/2020   Procedure: LEFT HEART CATH AND CORONARY ANGIOGRAPHY;  Surgeon: Martinique, Peter M, MD;  Location: [facility];  Service: Cardiovascular;  Laterality: N/A;   HPI:  Pt  is a 60 y.o. female who presented with L sided weakness, slurred speech, orthopnea,  and SOB. MRI brain 7/8: Multiple scattered acute to early subacute ischemic nonhemorrhagic infarcts involving the bilateral cerebral and cerebellar hemispheres as above, with largest area of infarction measuring up to 2 cm at the right occipital cortex. TPA was not given. 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 Pt passed the Yale swallow screen on 7/7 and 7/8 SLP consulted for swallowing on 7/10 due to "some difficulty swallowing and getting SOB when eating" as noted by referring MD. PMH: DM2, tobacco use and trying to quit, HLD, CAD s/p angio and stenting of mLAD 7/4.   Assessment / Plan / Recommendation Clinical Impression  Pt participated in speech/language/cognition evaluation. Pt denied any baseline deficits in speech, language, or cognition. 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 Pt denied any acute changes in language or cognition, but stated that her voice is now soft and that her significant other has described her speech as "slurred". The [facility] Mental Status Examination was completed to evaluate the pt's cognitive-linguistic skills. She achieved a score of 17/30 which is below the normal limits of 27 or more out of 30. She exhibited difficulty in the areas of awareness, attention, memory, complex problem solving, and executive function. She also presented with mild to moderate dysarthria characterized by reduced articulatory precision, reduced vocal intensity, and a reduced ability to adequately coordinate respiration with speech which together negatively impacted speech intelligibility at the conversational level. Skilled SLP services are  clinically indicated at this time to improve motor speech and cognitive-linguistic function.    SLP Assessment  SLP Recommendation/Assessment: Patient needs  continued Speech Lanaguage Pathology Services SLP Visit Diagnosis: Cognitive communication deficit (R41.841);Dysarthria and anarthria (R47.1)    Follow Up Recommendations  Inpatient Rehab    Frequency and Duration min 2x/week  2 weeks      SLP Evaluation Cognition  Overall Cognitive Status: Impaired/Different from baseline Arousal/Alertness: Awake/alert Orientation Level: Oriented X4 Attention: Focused;Sustained Focused Attention: Appears intact Sustained Attention: Impaired Sustained Attention Impairment: Verbal complex Memory: Impaired Memory Impairment: Retrieval deficit;Decreased recall of new information (Immediate: 5/5; delayed: 4/5; with cues: 1/1) Awareness: Impaired Awareness Impairment: Emergent impairment Problem Solving: Impaired Problem Solving Impairment: Verbal complex Executive Function: Reasoning;Sequencing;Organizing Sequencing: Impaired Sequencing Impairment: Verbal complex (Clock drawing: 0/4) Organizing: Impaired Organizing Impairment: Verbal complex (Backward digit span:)       Comprehension  Auditory Comprehension Overall Auditory Comprehension: Appears within functional limits for tasks assessed Yes/No Questions: Within Functional Limits Commands: Within Functional Limits Conversation: Complex    Expression Expression Primary Mode of Expression: Verbal Verbal Expression Overall Verbal Expression: Appears within functional limits for tasks assessed Initiation: No impairment Level of Generative/Spontaneous Verbalization: Conversation Repetition: No impairment   Oral / Motor  Oral Motor/Sensory Function Overall Oral Motor/Sensory Function: Within functional limits Lingual ROM: Within Functional Limits Lingual Strength: Reduced;Suspected CN XII (hypoglossal) dysfunction Motor Speech Overall Motor Speech: Impaired Respiration: Impaired Level of Impairment: Conversation Phonation: Low vocal intensity;Hoarse Resonance: Within functional  limits Articulation: Impaired Level of Impairment: Conversation Intelligibility: Intelligibility reduced Word: 75-100% accurate Phrase: 75-100% accurate Sentence: 75-100% accurate Conversation: 75-100% accurate Motor Planning: Witnin functional limits   Zehava Turski I. Hardin Negus, [place], [place] Office number [phone] Pager [place] 09/30/2020, 3:27 PM

## 2020-09-30 NOTE — Evaluation (Signed)
Clinical/Bedside Swallow Evaluation Patient Details  Name: Tracey Meyer MRN: [id number] Date of Birth: 1961-01-03  Today's Date: 09/30/2020 Time: SLP Start Time (ACUTE ONLY): 52 SLP Stop Time (ACUTE ONLY): 1439 SLP Time Calculation (min) (ACUTE ONLY): 15 min  Past Medical History:  Past Medical History:  Diagnosis Date   Cardiomyopathy, ischemic 09/24/2020   DM (diabetes mellitus), type 2 ([place]) 09/24/2020   HTN (hypertension)    Hyperlipidemia    Prediabetes    S/P angioplasty with stent to mLAD 09/22/20  09/24/2020   Tobacco abuse 09/24/2020   Transaminitis 09/24/2020   Past Surgical History:  Past Surgical History:  Procedure Laterality Date   BREAST SURGERY Left    age 67   CORONARY/GRAFT ACUTE MI REVASCULARIZATION N/A 09/22/2020   Procedure: Coronary/Graft Acute MI Revascularization;  Surgeon: Martinique, Peter M, MD;  Location: [facility];  Service: Cardiovascular;  Laterality: N/A;   LEFT HEART CATH AND CORONARY ANGIOGRAPHY N/A 09/22/2020   Procedure: LEFT HEART CATH AND CORONARY ANGIOGRAPHY;  Surgeon: Martinique, Peter M, MD;  Location: [facility];  Service: Cardiovascular;  Laterality: N/A;   HPI:  Pt is a 60 y.o. female who presented with L sided weakness, slurred speech, orthopnea,  and SOB. MRI brain 7/8: Multiple scattered acute to early subacute ischemic nonhemorrhagic infarcts involving the bilateral cerebral and cerebellar hemispheres as above, with largest area of infarction measuring up to 2 cm at the right occipital cortex. TPA was not given. 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 Pt passed the Yale swallow screen on 7/7 and 7/8 SLP consulted for swallowing on 7/10 due to "some difficulty swallowing and getting SOB when eating" as noted by referring MD. PMH: DM2, tobacco use and trying to quit, HLD, CAD s/p angio and stenting of mLAD 7/4.   Assessment / Plan / Recommendation Clinical Impression  Pt was seen for bedside swallow  evaluation and she denied a history of dysphagia PTA. She reported acute difficulty with food "getting stuck", coughing with p.o. intake, and dyspnea during meals. Oral mechanism exam was significant for lingual weakness, reduced lingual ROM, and reduced dentition. She demonstrated symptoms of pharyngeal dysphagia characterized by multiple swallows with solids, inconsistent coughing with puree and regular texture solids, and intermittent throat clearing with thin liquids via straw. A modified barium swallow study is recommended to further assess swallow function. Pt's current diet will be continued until it is completed. SLP Visit Diagnosis: Dysphagia, unspecified (R13.10)    Aspiration Risk  Mild aspiration risk    Diet Recommendation Regular;Thin liquid (continue current diet until MBS is completed)  Liquid Administration via: Cup;No straw Medication Administration: Whole meds with liquid Supervision: Patient able to self feed Compensations: Slow rate;Small sips/bites Postural Changes: Seated upright at 90 degrees    Other  Recommendations Oral Care Recommendations: Oral care BID   Follow up Recommendations Inpatient Rehab      Frequency and Duration min 2x/week  2 weeks       Prognosis Barriers to Reach Goals: Cognitive deficits      Swallow Study   General Date of Onset: 09/29/20 HPI: Pt is a 60 y.o. female who presented with L sided weakness, slurred speech, orthopnea,  and SOB. MRI brain 7/8: Multiple scattered acute to early subacute ischemic nonhemorrhagic infarcts involving the bilateral cerebral and cerebellar hemispheres as above, with largest area of infarction measuring up to 2 cm at the right occipital cortex. TPA was not given. MRA neck: ~70%  atheromatous stenosis at the origin of the left ICA. Pt passed the Yale swallow screen on 7/7 and 7/8 SLP consulted for swallowing on 7/10 due to "some difficulty swallowing and getting SOB when eating" as noted by referring MD. PMH:  DM2, tobacco use and trying to quit, HLD, CAD s/p angio and stenting of mLAD 7/4. Type of Study: Bedside Swallow Evaluation Previous Swallow Assessment: none Diet Prior to this Study: Regular;Thin liquids Temperature Spikes Noted: No Respiratory Status: Room air History of Recent Intubation: No Behavior/Cognition: Alert;Cooperative;Pleasant mood Oral Cavity Assessment: Within Functional Limits Oral Care Completed by SLP: No Oral Cavity - Dentition: Missing dentition;Adequate natural dentition Vision: Functional for self-feeding Self-Feeding Abilities: Able to feed self Patient Positioning: Upright in bed Baseline Vocal Quality: Low vocal intensity Volitional Cough: Weak Volitional Swallow: Able to elicit    Oral/Motor/Sensory Function Overall Oral Motor/Sensory Function: Within functional limits Lingual ROM: Within Functional Limits Lingual Strength: Reduced;Suspected CN XII (hypoglossal) dysfunction   Ice Chips Ice chips: Within functional limits Presentation: Spoon   Thin Liquid Thin Liquid: Impaired Presentation: Straw;Cup Pharyngeal  Phase Impairments: Throat Clearing - Immediate;Cough - Delayed    Nectar Thick Nectar Thick Liquid: Not tested   Honey Thick Honey Thick Liquid: Not tested   Puree Puree: Impaired Presentation: Spoon Pharyngeal Phase Impairments: Multiple swallows;Cough - Immediate   Solid     Solid: Impaired Presentation: Self Fed Pharyngeal Phase Impairments: Multiple swallows;Throat Clearing - Delayed     Tyriq Moragne I. Hardin Negus, [place], [place] Office number [phone] Pager [phone]  Horton Marshall 09/30/2020,3:16 PM

## 2020-10-01 ENCOUNTER — Inpatient Hospital Stay (HOSPITAL_COMMUNITY): Payer: Medicaid Other

## 2020-10-01 ENCOUNTER — Other Ambulatory Visit (HOSPITAL_COMMUNITY): Payer: Self-pay

## 2020-10-01 DIAGNOSIS — Z955 Presence of coronary angioplasty implant and graft: Secondary | ICD-10-CM

## 2020-10-01 DIAGNOSIS — I6522 Occlusion and stenosis of left carotid artery: Secondary | ICD-10-CM

## 2020-10-01 DIAGNOSIS — I213 ST elevation (STEMI) myocardial infarction of unspecified site: Secondary | ICD-10-CM

## 2020-10-01 LAB — GLUCOSE, CAPILLARY
Glucose-Capillary: 138 mg/dL — ABNORMAL HIGH (ref 70–99)
Glucose-Capillary: 175 mg/dL — ABNORMAL HIGH (ref 70–99)
Glucose-Capillary: 128 mg/dL — ABNORMAL HIGH (ref 70–99)
Glucose-Capillary: 133 mg/dL — ABNORMAL HIGH (ref 70–99)

## 2020-10-01 IMAGING — CT CT ANGIO HEAD-NECK (W OR W/O PERF)
3 of 11 series · 9 of 34 positions shown · IV contrast (omnipaque)
Comparison: MRI studies [DATE]

CLINICAL DATA: Follow-up stroke. Left-sided weakness. Widely
distributed acute infarctions by MRI, consistent with embolic
disease from the heart or ascending aorta.

EXAM:
CT ANGIOGRAPHY HEAD AND NECK
TECHNIQUE: Multidetector CT imaging of the head and neck was performed using
the standard protocol during bolus administration of intravenous
contrast. Multiplanar CT image reconstructions and MIPs were
obtained to evaluate the vascular anatomy. Carotid stenosis
measurements (when applicable) are obtained utilizing NASCET
criteria, using the distal internal carotid diameter as the
denominator.
CONTRAST:  75mL OMNIPAQUE IOHEXOL 350 MG/ML SOLN

[Series 6: cta neck · axial · 0.49mm/px · z∈[+1108,+1452]mm · 3 of 173 slices shown]
[im 1/173  soft-tissue]
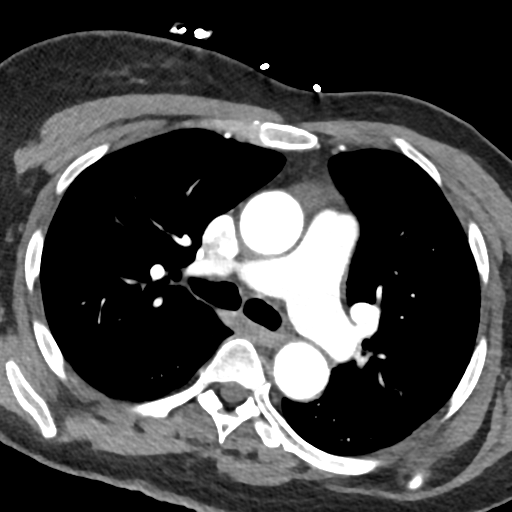
[im 87/173  bone]
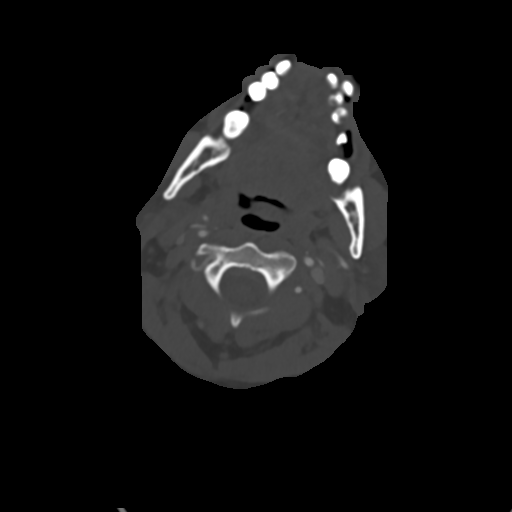
[im 173/173  soft-tissue]
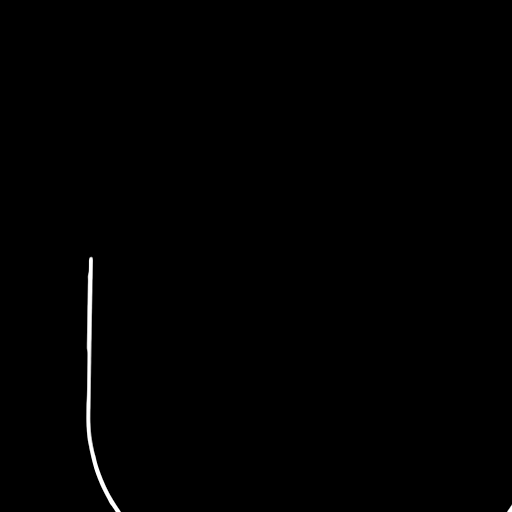

[Series 10: sagittal · sagittal · 0.30mm/px · 1 of 54 slices shown]
[im 8/54  soft-tissue]
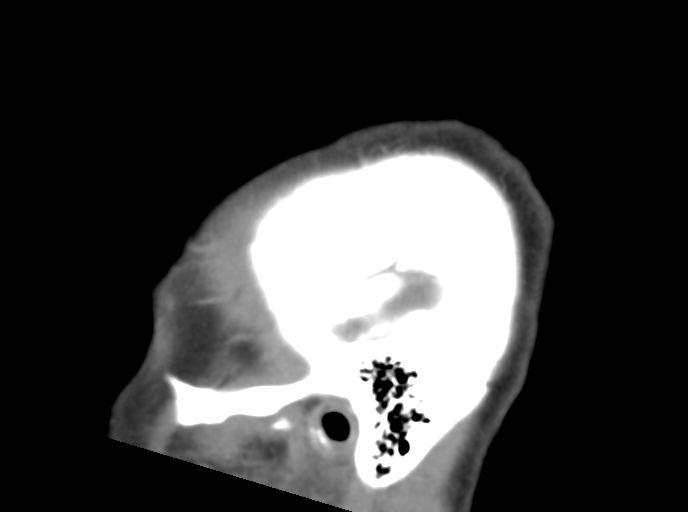

[Series 11: cta neck axial · axial · 0.39mm/px · z∈[+1164,+1391]mm · 5 of 341 slices shown]
[im 57/341  soft-tissue]
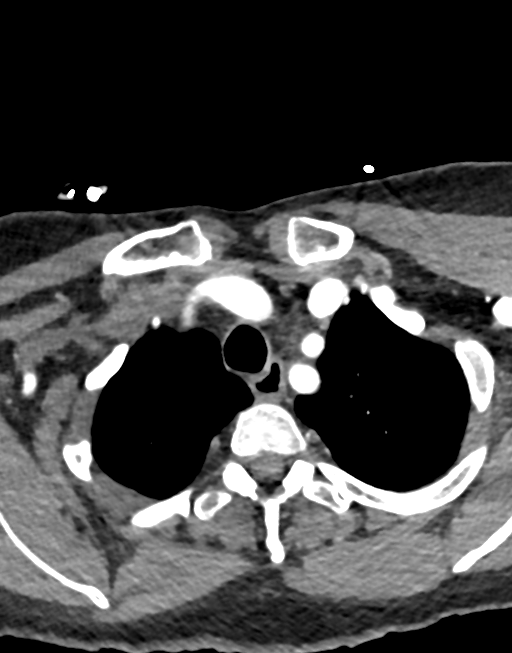
[im 114/341  soft-tissue]
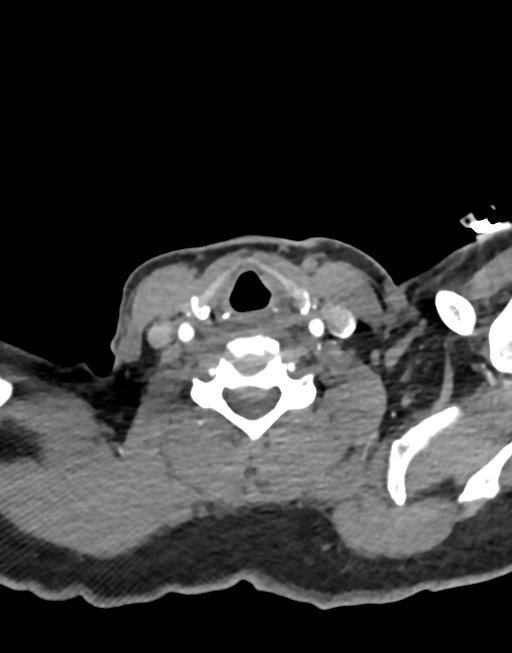
[im 171/341  soft-tissue]
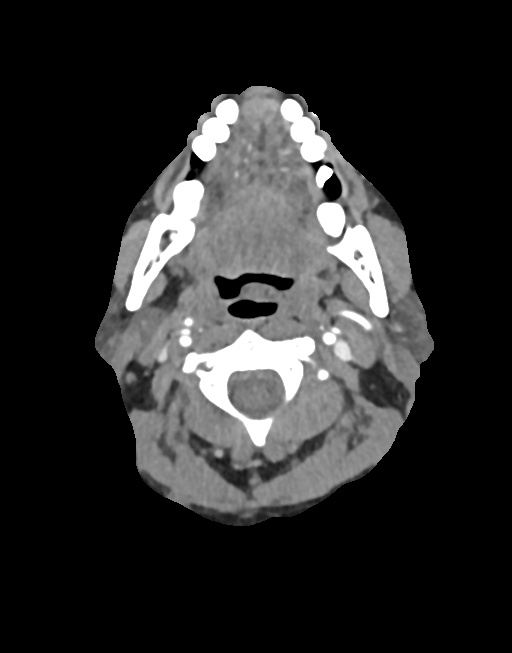
[im 227/341  soft-tissue]
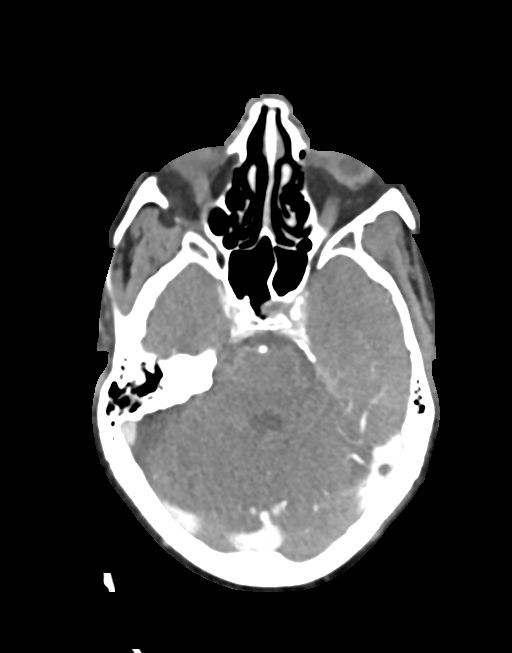
[im 284/341  soft-tissue]
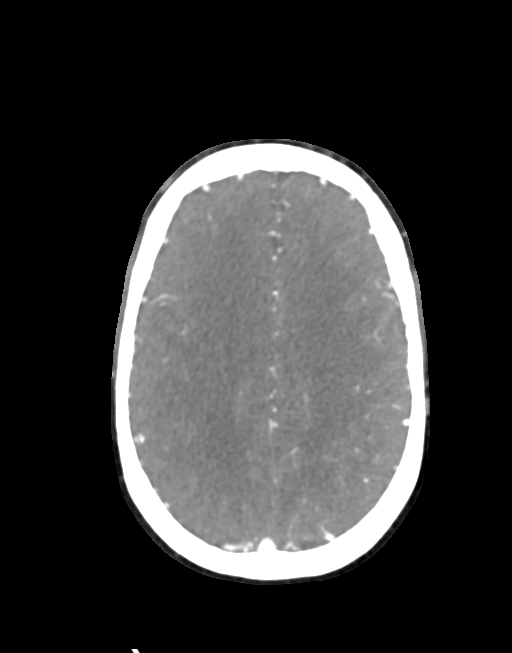

[9 of 34 positions shown; findings below may reference images not displayed]

FINDINGS: CT HEAD FINDINGS

Brain: Acute infarction at the pontomedullary junction shown by MRI
is difficult to appreciate by CT. No focal cerebellar finding.
Low-density in the right occipital lobe consistent with the 2 cm
acute infarction in that region. Other punctate acute infarctions
scattered within both hemispheres cannot be specifically appreciated
by CT. Chronic small-vessel ischemic changes are present throughout
the cerebral hemispheric white matter. No hemorrhage, hydrocephalus
or extra-axial collection.

Vascular: There is atherosclerotic calcification of the major
vessels at the base of the brain.

Skull: Negative

Sinuses: Clear/normal

Orbits: Normal

Review of the MIP images confirms the above findings

CTA NECK FINDINGS

Aortic arch: No aortic atherosclerotic calcification is seen. No
dissection. Branching pattern is normal without origin stenosis.

Right carotid system: Common carotid artery widely patent to the
bifurcation. There is calcified plaque at the carotid bifurcation
but without stenosis.

Left carotid system: Common carotid artery widely patent to the
bifurcation. There is calcified plaque at the carotid bifurcation
and ICA bulb but no stenosis.

Vertebral arteries: Both vertebral artery origins are widely patent.
Both vertebral arteries appear normal through the cervical region to
the foramen magnum.

Skeleton: Minimal cervical spondylosis.

Other neck: No mass or lymphadenopathy.

Upper chest: Normal

Review of the MIP images confirms the above findings

CTA HEAD FINDINGS

Anterior circulation: Both internal carotid arteries are patent
through the skull base and siphon regions. The anterior and middle
cerebral vessels are patent. No large vessel occlusion or
correctable proximal stenosis. No aneurysm or vascular malformation.

Posterior circulation: Left vertebral artery is dominant and is
widely patent through the foramen magnum. Mild narrowing in the
distal V4 segment, but the vessel is patent to the basilar. Right
vertebral artery either terminates in PICA or is occluded beyond
PICA. Proximal basilar artery shows mild atherosclerotic
irregularity. Superior cerebellar and posterior cerebral arteries
are patent.

Venous sinuses: Patent and normal.

Anatomic variants: None significant otherwise.

Review of the MIP images confirms the above findings
IMPRESSION: The aorta has a normal appearance.

Mild atherosclerotic plaque at both carotid bifurcations but without
stenosis on either side. No evidence of ulceration or pronounced
irregularity.

Mild atherosclerotic change at the carotid siphons but without
stenosis.

No intracranial large or medium vessel occlusion in the anterior
circulation.

Right vertebral artery either terminates in PICA or is occluded
distal to PICA. Left vertebral artery shows a moderate stenosis of
the distal V4 segment but does remain patent to the basilar. Mild
atherosclerotic irregularity of the proximal basilar artery.

## 2020-10-01 MED ORDER — IOHEXOL 350 MG/ML SOLN
75.0000 mL | Freq: Once | INTRAVENOUS | Status: AC | PRN
  Administered 2020-10-01: 75 mL via INTRAVENOUS

## 2020-10-01 NOTE — Care Management (Signed)
1402 10-01-20 Case Manager received a consult: Uninsured. Recently discharged following a STEMI on brilinta, entresto, carvedilol, spironolactone, and crestor. Patient lives in [place] and she currently is uninsured. Patient states she is without a PCP in the area. Patient will have to call the [facility] to get a date to come in and be seen information will be placed on the AVS. Patient is without insurance and may benefit from [facility] once stable if she has not used within the year. Patient will benefit from all prescription medications being generic at discharge. Patient is getting assistance from someone in [place] named Mechele Claude for [facility] assistance @ [phone]. The [facility] Team will continue to follow for additional needs.

## 2020-10-01 NOTE — Progress Notes (Signed)
Pt noted to have vaginal bleeding half dollar in size, pink in color. Per nursing assistant it occurred on 7/10 as well. Pt is unaware of bleeding, denies abdominal pain or pain with urination. Will continue to monitor.

## 2020-10-01 NOTE — Progress Notes (Addendum)
PT Cancellation Note  Patient Details Name: Tracey Meyer MRN: [id number] DOB: 1960-06-22   Cancelled Treatment:    Reason Eval/Treat Not Completed: Patient declined, no reason specified. Pt stating "I just don't feel up to it today." Pt was NPO this AM for TEE. TEE rescheduled for tomorrow. Pt reports she has been given something to eat but she still just doesn't feel good. PT to re-attempt as time allows. Per chart, pt also scheduled to undergo MBS today.   Lorriane Shire 10/01/2020, 11:15 AM  Lorrin Goodell, PT  Office # [phone] Pager [phone]

## 2020-10-01 NOTE — Consult Note (Addendum)
Hospital Consult    Reason for Consult:  carotid stenosis Requesting Physician:  Dr. Wynelle Cleveland MRN #:  [id number]  History of Present Illness: This is a 60 y.o. female who presented 3 days ago with left-sided weakness, slurred speech and found to have an acute nonhemorrhagic brain stroke.  Neurology was consulted and feels her scattered infarcts were likely cardioembolic in nature secondary to cardiomyopathy.  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  Vascular surgery consultation is requested.  TTE revealed no LV thrombus. She is to undergo TEE tomorrow. Because of her left-sided weakness, plans are to discharge to inpatient rehab.  The patient is interviewed and examined on the cardiac floor where she is sitting up in bed eating lunch without dysphagia.  She has persistent loss of motor function of the left upper and lower extremities.  Her speech is clear with mild dysarthria.  She denies claudication or rest pain.  Her past medical history is significant for coronary artery disease status post drug-eluting stent placement September 22, 2020 by Dr. Martinique.  The pt is on a statin for cholesterol management.  The pt is on a daily aspirin.   Other AC:  Brilinta The pt is on beta blocker for hypertension.  On Entresto and Aldactone for chronic combined CHF The pt is diabetic.   Tobacco hx: + cigarettes  Past Medical History:  Diagnosis Date   Cardiomyopathy, ischemic 09/24/2020   DM (diabetes mellitus), type 2 ([place]) 09/24/2020   HTN (hypertension)    Hyperlipidemia    Prediabetes    S/P angioplasty with stent to mLAD 09/22/20  09/24/2020   Tobacco abuse 09/24/2020   Transaminitis 09/24/2020    Past Surgical History:  Procedure Laterality Date   BREAST SURGERY Left    age 77   CORONARY/GRAFT ACUTE MI REVASCULARIZATION N/A 09/22/2020   Procedure: Coronary/Graft Acute MI  Revascularization;  Surgeon: Martinique, Peter M, MD;  Location: [facility];  Service: Cardiovascular;  Laterality: N/A;   LEFT HEART CATH AND CORONARY ANGIOGRAPHY N/A 09/22/2020   Procedure: LEFT HEART CATH AND CORONARY ANGIOGRAPHY;  Surgeon: Martinique, Peter M, MD;  Location: [facility];  Service: Cardiovascular;  Laterality: N/A;    No Known Allergies  Prior to Admission medications   Medication Sig Start Date End Date Taking? Authorizing Provider  acetaminophen (TYLENOL) 325 MG tablet Take 2 tablets (650 mg total) by mouth every 4 (four) hours as needed for headache or mild pain. 09/24/20  Yes Isaiah Serge, NP  aspirin EC 81 MG EC tablet Take 1 tablet (81 mg total) by mouth daily. Swallow whole. 09/25/20  Yes Isaiah Serge, NP  calcium carbonate (TUMS - DOSED IN MG ELEMENTAL CALCIUM) 500 MG chewable tablet Chew 2 tablets by mouth daily as needed for indigestion or heartburn.   Yes [provider]  carvedilol (COREG) 12.5 MG tablet Take 1 tablet (12.5 mg total) by mouth 2 (two) times daily with a meal. 09/24/20  Yes Isaiah Serge, NP  Glycerin-Hypromellose-PEG 400 (VISINE DRY EYE OP) Place 1 drop into both eyes daily as needed (dry eyes).   Yes [provider]  metFORMIN (GLUCOPHAGE) 500 MG tablet Take 1 tablet (500 mg total) by mouth 2 (two) times daily with a meal. 09/24/20  Yes Isaiah Serge, NP  nitroGLYCERIN (NITROSTAT) 0.4 MG SL tablet Place 1 tablet (  0.4 mg total) under the tongue every 5 (five) minutes x 3 doses as needed for chest pain. 09/24/20  Yes Isaiah Serge, NP  rosuvastatin (CRESTOR) 10 MG tablet Take 1 tablet (10 mg total) by mouth daily. 09/25/20  Yes Isaiah Serge, NP  sacubitril-valsartan (ENTRESTO) 24-26 MG Take 1 tablet by mouth 2 (two) times daily. 09/24/20  Yes Isaiah Serge, NP  spironolactone (ALDACTONE) 25 MG tablet Take 1 tablet (25 mg total) by mouth daily. 09/25/20  Yes Isaiah Serge, NP  ticagrelor (BRILINTA) 90 MG TABS tablet Take 1  tablet (90 mg total) by mouth 2 (two) times daily. 09/24/20  Yes Isaiah Serge, NP    Social History   Socioeconomic History   Marital status: Single    Spouse name: Not on file   Number of children: Not on file   Years of education: Not on file   Highest education level: Not on file  Occupational History   Not on file  Tobacco Use   Smoking status: Every Day    Packs/day: 0.50    Pack years: 0.00    Types: Cigarettes   Smokeless tobacco: Never  Substance and Sexual Activity   Alcohol use: Not on file   Drug use: Never   Sexual activity: Not on file  Other Topics Concern   Not on file  Social History Narrative   Not on file   Social Determinants of Health   Financial Resource Strain: Not on file  Food Insecurity: Not on file  Transportation Needs: Not on file  Physical Activity: Not on file  Stress: Not on file  Social Connections: Not on file  Intimate Partner Violence: Not on file     Family History  Problem Relation Age of Onset   Cancer Mother    Heart disease Brother     ROS: [x]  Positive   [ ]  Negative   [ ]  All sytems reviewed and are negative  Cardiac: []  chest pain/pressure []  palpitations []  SOB lying flat []  DOE  Vascular: []  pain in legs while walking []  pain in legs at rest []  pain in legs at night []  non-healing ulcers []  hx of DVT []  swelling in legs  Pulmonary: []  productive cough []  asthma/wheezing []  home O2  Neurologic: []  weakness in [L] arms [L] legs []  numbness in []  arms []  legs [x]  hx of CVA []  mini stroke [] difficulty speaking or slurred speech []  temporary loss of vision in one eye []  dizziness  Hematologic: []  hx of cancer []  bleeding problems []  problems with blood clotting easily  Endocrine:   [x]  diabetes []  thyroid disease  GI []  vomiting blood []  blood in stool  GU: []  CKD/renal failure []  HD--[]  M/W/F or []  T/T/S []  burning with urination []  blood in urine  Psychiatric: []  anxiety []   depression  Musculoskeletal: []  arthritis []  joint pain  Integumentary: []  rashes []  ulcers  Constitutional: []  fever []  chills   Physical Examination  Vitals:   10/01/20 0402 10/01/20 1134  BP: (!) 155/70 128/79  Pulse: 63 70  Resp: 12 20  Temp: 98.1 F (36.7 C) 98.1 F (36.7 C)  SpO2: 100%    Body mass index is 26.11 kg/m.  General:  WDWN in NAD Gait: Not observed HENT: WNL, normocephalic Pulmonary: normal non-labored breathing Cardiac: regular rate and rhthym Abdomen:  soft, NT/ND, no masses Skin: without rashes Vascular Exam/Pulses: 2+ bilateral radial and femoral artery pulses.  2+ left DP pulse. Extremities: without  ischemic changes, without Gangrene , without cellulitis; without open wounds; both feet are warm and well-perfused.  Intact sensation bilaterally.  Unable to lift left leg or left arm from bed. Musculoskeletal: no muscle wasting or atrophy  Neurologic: A&O X 3; positive left focal weakness ; speech is fluent/normal Psychiatric:  The pt has Normal affect  CBC    Component Value Date/Time   WBC 6.9 09/27/2020 1900   RBC 3.86 (L) 09/27/2020 1900   HGB 12.2 09/27/2020 1950   HCT 36.0 09/27/2020 1950   PLT 352 09/27/2020 1900   MCV 94.3 09/27/2020 1900   MCH 31.3 09/27/2020 1900   MCHC 33.2 09/27/2020 1900   RDW 13.1 09/27/2020 1900   LYMPHSABS 1.1 09/27/2020 1900   MONOABS 0.7 09/27/2020 1900   EOSABS 0.1 09/27/2020 1900   BASOSABS 0.0 09/27/2020 1900    BMET    Component Value Date/Time   NA 135 09/29/2020 0912   K 4.1 09/29/2020 0912   CL 104 09/29/2020 0912   CO2 22 09/29/2020 0912   GLUCOSE 145 (H) 09/29/2020 0912   BUN 24 (H) 09/29/2020 0912   CREATININE 1.10 (H) 09/29/2020 0912   CALCIUM 9.6 09/29/2020 0912   GFRNONAA 58 (L) 09/29/2020 0912    COAGS: Lab Results  Component Value Date   INR 1.0 09/27/2020     Non-Invasive Vascular Imaging:   09/27/2020 MRI head and neck: 1. Multiple scattered acute to early subacute  ischemic nonhemorrhagic infarcts involving the bilateral cerebral and cerebellar hemispheres as above, with largest area of infarction measuring up to 2 cm at the right occipital cortex. Given the various vascular distributions involved, a central thromboembolic etiology is suspected. 2. Underlying moderate chronic microvascular ischemic disease with a few scattered remote lacunar infarcts involving the left thalamus and pons.  1. Technically limited exam due to motion artifact. 2. Approximate 70% atheromatous stenosis at the origin of the left ICA. 3. Otherwise negative MRA of the neck. No other hemodynamically significant or correctable stenosis identified.   ASSESSMENT/PLAN: This is a 60 y.o. female with acute multifocal ischemic nonhemorrhagic infarcts involving the bilateral cerebral and cerebellar hemispheres.  Distribution favor central thromboembolic etiology.  MRA of the neck reveals approximately 70% left ICA stenosis.  Patient is currently receiving statin, aspirin and Brilinta recent anterior Wall ST EMI 09/21/2020 with drug-eluting LAD coronary artery stent.  TEE is scheduled for tomorrow.  Left-upper and lower extremity flaccidity.  Placement for rehabilitation underway.  Dr. Carlis Abbott to review case.  We will make arrangements for outpatient follow-up.  Risa Grill, PA-C [facility] [phone]   I have seen and evaluated the patient. I agree with the PA note as documented above.  60 year old female that vascular surgery has been consulted for 70% left ICA stenosis.  Patient was admitted recently with STEMI on 09/21/2020 and underwent heart cath with drug-eluting stent to the LAD with severely reduced ejection fraction of 35 to 40%.  She states after discharge on July 4 she developed profound left-sided weakness.  She was readmitted for stroke work-up.  MRI has shown bilateral cerebral and cerebellar infarcts.  On exam she has profound weakness of the left upper  and lower extremity.  She denies any previous history of strokes or TIAs.  I discussed that carotid disease can certainly be an etiology for strokes but given her bilateral infarcts in both cerebellar and cerebral regions, I do not think this is the etiology for her stroke.  Right ICA only mild 20% stenosis.  She is getting a TEE tomorrow.  I will arrange outpatient follow-up in 3 months with carotid duplex and we will be happy to follow her carotid disease on an outpatient basis.  Please call if any further changes/questions or if we can be of further assistance.  Marty Heck, MD [facility] Office: [phone]

## 2020-10-01 NOTE — Progress Notes (Addendum)
PROGRESS NOTE    Tracey Meyer   OVZ:[id number]  DOB: June 03, 1960  DOA: 09/27/2020 PCP: Pcp, No   Brief Narrative:  Tracey Meyer is a 60 y/o F with CAD (recent stent to the LAD on 7/4), ischemic cardiomyopathy with EF of 35-40%, HTN, DM2, HLD and smoking who presented to the ED with shortness of breath and orthopnea.  She also stated that on the evening of 7/4, she developed left sided weakness and slurred speech which did not improve.   In ED: CT head> 18 x 16 x 17 mm focus of peripheral hypo attenuation in the posterior right parietooccipital region. This is nonspecific and could be related to subacute infarct, but given the somewhat rounded configuration, MRI of the brain without and with contrast recommended to further evaluate.  Cardiology and neurology consulted. IV Lasix started and MRI/ MRA ordered. Admitted to treat acute CHF and work up neurological symptoms.  Subjective: No new complaints.   I was sent a message on secure chat by RN that there was vaginal bleeding I> pink substance in bed pan about the size of a half dollar- per nursing assistant, it occurred on 7/10 as well- patient was unaware of bleeding- follow    Assessment & Plan:   Principal Problem:   Acute ischemic strokes- multifocal and bilateral   Hyperlipidemia -symptoms occurred on the night of 7/4 after she got home from the hospital - MRI > Multiple scattered acute to early subacute ischemic nonhemorrhagic infarcts involving the bilateral cerebral and cerebellar hemispheres as above, with largest area of infarction measuring up to 2 cm at the right occipital cortex. Given the various vascular distributions involved, a central thromboembolic etiology is suspected. - CTA neck showed Left ICA 70% stenosis -LDL is 130-Crestor started after MI-being increased from 10 mg this admission to 40 mg daily -A1c is 6.8 -2D echo does not reveal a thrombus   -Neurology recommending to continue 81 mg  aspirin and Brilinta -TEE recommended by neurology - if TEE unrevealing, 30 day event monitor recommended -Therapy recommends CIR-I have consulted them - SLP eval done- mild aspiration risk- regular diet with thin liquid recommended and MBS ordered - SLUMs is 17/30 - 7/11> TEE scheduled for tomorrow- MBS completed and SLP recommending regular diet and thin liquids, asked RN to place Prevalon boot on left foot to prevent pressure ulcer and to elevate left arm to prevent dependent edema  Active Problems: Left ICA stenosis - MRA>> Approximate 70% atheromatous stenosis at the origin of the left ICA- clearly not the cause of current CVAs - Spoke with vascular surgery. Dr Carlis Abbott to get an appt so she does not fall through the cracks  Chronic systolic heart failure (mild) CAD with NSTEMI and stent in mid LAD on 09/22/2020 -Echo 09/22/2020> EF 35 to 40%, mild LVH, grade 1 diastolic dysfunction, severe hypokinesis of the LV entire anterior wall anteroseptal wall apical segment and inferior apical segment. -Echo 09/28/2020> EF 30 to 41%, grade 2 diastolic dysfunction -Given 20 mg of IV Lasix on 7/7 and no diuretics since - Continue aspirin, Brilinta, Crestor -Antihypertensives being held to allow for permissive hypertension- BP 155/70 and then 128/79 today -Cardiology is following  Urinary retention - 7/8 Patient had the urge to urinate but was unable to-bladder scan revealed 700 cc- In-N-Out cath done and 600 cc of urine obtained  - no further I and O caths needed  CKD 3B - Cr has mostly been ~ 1.0-1.2  in hospital - Cr  was 0.80 on 7/2- I have no labs prior to this      DM (diabetes mellitus), type 2 ([place]) -On metformin at home- sugars < 150 here in the hospital - she is eating most of her meals-   -Hemoglobin A1C    Component Value Date/Time   HGBA1C 6.8 (H) 09/28/2020 0403   Tobacco abuse - Smoking cessation counseling given  Time spent in minutes: 35 DVT prophylaxis: enoxaparin (LOVENOX)  injection 40 mg Start: 09/28/20 0800  Code Status: full code Family Communication:  Level of Care: Level of care: Telemetry Cardiac Disposition Plan:  Status is: Inpatient  Remains inpatient appropriate because:Inpatient level of care appropriate due to severity of illness  Dispo: The patient is from: Home              Anticipated d/c is to:  CIR              Patient currently is not medically stable to d/c.   Difficult to place patient Yes      Consultants:  Cardiology Neurology  Procedures:  none Antimicrobials:  Anti-infectives (From admission, onward)    None        Objective: Vitals:   10/01/20 0051 10/01/20 0402 10/01/20 0500 10/01/20 1134  BP: 135/87 (!) 155/70  128/79  Pulse: 70 63  70  Resp: 16 12  20   Temp: 98 F (36.7 C) 98.1 F (36.7 C)  98.1 F (36.7 C)  TempSrc: Oral Oral  Oral  SpO2: 98% 100%    Weight:   69 kg   Height:        Intake/Output Summary (Last 24 hours) at 10/01/2020 1327 Last data filed at 10/01/2020 0520 Gross per 24 hour  Intake 100 ml  Output 300 ml  Net -200 ml    Filed Weights   09/29/20 0421 09/30/20 0438 10/01/20 0500  Weight: 70.4 kg 69.5 kg 69 kg    Examination: General exam: Appears comfortable  HEENT: PERRLA, oral mucosa moist, no sclera icterus or thrush Respiratory system: Clear to auscultation. Respiratory effort normal. Cardiovascular system: S1 & S2 heard, regular rate and rhythm Gastrointestinal system: Abdomen soft, non-tender, nondistended. Normal bowel sounds   Central nervous system: Alert and oriented. Strength 0/5 in Left side Extremities: No cyanosis, clubbing or edema Skin: No rashes or ulcers Psychiatry:  Mood & affect appropriate.      Data Reviewed: I have personally reviewed following labs and imaging studies  CBC: Recent Labs  Lab 09/27/20 1900 09/27/20 1950  WBC 6.9  --   NEUTROABS 4.9  --   HGB 12.1 12.2  HCT 36.4 36.0  MCV 94.3  --   PLT 352  --     Basic Metabolic  Panel: Recent Labs  Lab 09/27/20 1900 09/27/20 1950 09/29/20 0912  NA 134* 138 135  K 3.8 3.8 4.1  CL 104 108 104  CO2 18*  --  22  GLUCOSE 205* 201* 145*  BUN 26* 26* 24*  CREATININE 1.30* 1.20* 1.10*  CALCIUM 9.7  --  9.6    GFR: Estimated Creatinine Clearance: 52.5 mL/min (A) (by C-G formula based on SCr of 1.1 mg/dL (H)). Liver Function Tests: Recent Labs  Lab 09/27/20 1900  AST 28  ALT 26  ALKPHOS 47  BILITOT 0.5  PROT 6.8  ALBUMIN 3.0*    No results for input(s): LIPASE, AMYLASE in the last 168 hours. No results for input(s): AMMONIA in the last 168 hours. Coagulation Profile: Recent Labs  Lab 09/27/20 1900  INR 1.0    Cardiac Enzymes: No results for input(s): CKTOTAL, CKMB, CKMBINDEX, TROPONINI in the last 168 hours. BNP (last 3 results) No results for input(s): PROBNP in the last 8760 hours. HbA1C: No results for input(s): HGBA1C in the last 72 hours.  CBG: Recent Labs  Lab 09/30/20 1155 09/30/20 1606 09/30/20 2114 10/01/20 0751 10/01/20 1133  GLUCAP 134* 129* 146* 138* 175*    Lipid Profile: No results for input(s): CHOL, HDL, LDLCALC, TRIG, CHOLHDL, LDLDIRECT in the last 72 hours.  Thyroid Function Tests: No results for input(s): TSH, T4TOTAL, FREET4, T3FREE, THYROIDAB in the last 72 hours. Anemia Panel: No results for input(s): VITAMINB12, FOLATE, FERRITIN, TIBC, IRON, RETICCTPCT in the last 72 hours. Urine analysis:    Component Value Date/Time   COLORURINE YELLOW 09/28/2020 1528   APPEARANCEUR CLEAR 09/28/2020 1528   LABSPEC 1.020 09/28/2020 1528   PHURINE 5.5 09/28/2020 1528   GLUCOSEU NEGATIVE 09/28/2020 1528   HGBUR NEGATIVE 09/28/2020 [place] 09/28/2020 1528   [place] 09/28/2020 1528   PROTEINUR NEGATIVE 09/28/2020 1528   NITRITE POSITIVE (A) 09/28/2020 1528   LEUKOCYTESUR NEGATIVE 09/28/2020 1528   Sepsis Labs: @LABRCNTIP (procalcitonin:4,lacticidven:4) ) Recent Results (from the past 240  hour(s))  Resp Panel by RT-PCR (Flu A&B, Covid) Nasopharyngeal Swab     Status: None   Collection Time: 09/22/20  9:18 AM   Specimen: Nasopharyngeal Swab; Nasopharyngeal(NP) swabs in vial transport medium  Result Value Ref Range Status   SARS Coronavirus 2 by RT PCR NEGATIVE NEGATIVE Final    Comment: (NOTE) SARS-CoV-2 target nucleic acids are NOT DETECTED.  The SARS-CoV-2 RNA is generally detectable in upper respiratory specimens during the acute phase of infection. The lowest concentration of SARS-CoV-2 viral copies this assay can detect is 138 copies/mL. A negative result does not preclude SARS-Cov-2 infection and should not be used as the sole basis for treatment or other patient management decisions. A negative result may occur with  improper specimen collection/handling, submission of specimen other than nasopharyngeal swab, presence of viral mutation(s) within the areas targeted by this assay, and inadequate number of viral copies(<138 copies/mL). A negative result must be combined with clinical observations, patient history, and epidemiological information. The expected result is Negative.  Fact Sheet for Patients:  EntrepreneurPulse.com.au  Fact Sheet for Healthcare Providers:  IncredibleEmployment.be  This test is no t yet approved or cleared by the [place] FDA and  has been authorized for detection and/or diagnosis of SARS-CoV-2 by FDA under an Emergency Use Authorization (EUA). This EUA will remain  in effect (meaning this test can be used) for the duration of the COVID-19 declaration under Section 564(b)(1) of the Act, 21 U.S.C.section 360bbb-3(b)(1), unless the authorization is terminated  or revoked sooner.       Influenza A by PCR NEGATIVE NEGATIVE Final   Influenza B by PCR NEGATIVE NEGATIVE Final    Comment: (NOTE) The Xpert Xpress SARS-CoV-2/FLU/RSV plus assay is intended as an aid in the diagnosis of influenza from  Nasopharyngeal swab specimens and should not be used as a sole basis for treatment. Nasal washings and aspirates are unacceptable for Xpert Xpress SARS-CoV-2/FLU/RSV testing.  Fact Sheet for Patients: EntrepreneurPulse.com.au  Fact Sheet for Healthcare Providers: IncredibleEmployment.be  This test is not yet approved or cleared by the [place] FDA and has been authorized for detection and/or diagnosis of SARS-CoV-2 by FDA under an Emergency Use Authorization (EUA). This EUA will remain in effect (meaning this test  can be used) for the duration of the COVID-19 declaration under Section 564(b)(1) of the Act, 21 U.S.C. section 360bbb-3(b)(1), unless the authorization is terminated or revoked.  Performed at [facility], [place] [address], [place], [place] [address]   MRSA Next Gen by PCR, Nasal     Status: None   Collection Time: 09/22/20 10:59 AM   Specimen: Nasal Mucosa; Nasal Swab  Result Value Ref Range Status   MRSA by PCR Next Gen NOT DETECTED NOT DETECTED Final    Comment: (NOTE) The GeneXpert MRSA Assay (FDA approved for NASAL specimens only), is one component of a comprehensive MRSA colonization surveillance program. It is not intended to diagnose MRSA infection nor to guide or monitor treatment for MRSA infections. Test performance is not FDA approved in patients less than 50 years old. Performed at [facility], [place] [address], [place], [place] [address]   Resp Panel by RT-PCR (Flu A&B, Covid) Nasopharyngeal Swab     Status: None   Collection Time: 09/27/20  6:54 PM   Specimen: Nasopharyngeal Swab; Nasopharyngeal(NP) swabs in vial transport medium  Result Value Ref Range Status   SARS Coronavirus 2 by RT PCR NEGATIVE NEGATIVE Final    Comment: (NOTE) SARS-CoV-2 target nucleic acids are NOT DETECTED.  The SARS-CoV-2 RNA is generally detectable in upper respiratory specimens during the acute phase of infection. The  lowest concentration of SARS-CoV-2 viral copies this assay can detect is 138 copies/mL. A negative result does not preclude SARS-Cov-2 infection and should not be used as the sole basis for treatment or other patient management decisions. A negative result may occur with  improper specimen collection/handling, submission of specimen other than nasopharyngeal swab, presence of viral mutation(s) within the areas targeted by this assay, and inadequate number of viral copies(<138 copies/mL). A negative result must be combined with clinical observations, patient history, and epidemiological information. The expected result is Negative.  Fact Sheet for Patients:  EntrepreneurPulse.com.au  Fact Sheet for Healthcare Providers:  IncredibleEmployment.be  This test is no t yet approved or cleared by the [place] FDA and  has been authorized for detection and/or diagnosis of SARS-CoV-2 by FDA under an Emergency Use Authorization (EUA). This EUA will remain  in effect (meaning this test can be used) for the duration of the COVID-19 declaration under Section 564(b)(1) of the Act, 21 U.S.C.section 360bbb-3(b)(1), unless the authorization is terminated  or revoked sooner.       Influenza A by PCR NEGATIVE NEGATIVE Final   Influenza B by PCR NEGATIVE NEGATIVE Final    Comment: (NOTE) The Xpert Xpress SARS-CoV-2/FLU/RSV plus assay is intended as an aid in the diagnosis of influenza from Nasopharyngeal swab specimens and should not be used as a sole basis for treatment. Nasal washings and aspirates are unacceptable for Xpert Xpress SARS-CoV-2/FLU/RSV testing.  Fact Sheet for Patients: EntrepreneurPulse.com.au  Fact Sheet for Healthcare Providers: IncredibleEmployment.be  This test is not yet approved or cleared by the [place] FDA and has been authorized for detection and/or diagnosis of SARS-CoV-2 by FDA under  an Emergency Use Authorization (EUA). This EUA will remain in effect (meaning this test can be used) for the duration of the COVID-19 declaration under Section 564(b)(1) of the Act, 21 U.S.C. section 360bbb-3(b)(1), unless the authorization is terminated or revoked.  Performed at [facility], [place] [address] [place], [place] [address]           Radiology Studies: No results found.  Scheduled Meds:   stroke: mapping our early stages of recovery book   Does not apply Once   aspirin EC  81 mg Oral Daily   enoxaparin (LOVENOX) injection  40 mg Subcutaneous Q24H   insulin aspart  0-5 Units Subcutaneous QHS   insulin aspart  0-9 Units Subcutaneous TID WC   rosuvastatin  40 mg Oral Daily   ticagrelor  90 mg Oral BID   Continuous Infusions:  sodium chloride       LOS: 4 days      Debbe Odea, MD [facility] Pager: [email or web address] 10/01/2020, 1:27 PM

## 2020-10-01 NOTE — Progress Notes (Signed)
Inpatient Rehab Admissions Coordinator   I spoke with Pt.'s partner, [name], who reports that while he could provide supervision, he does not think he will be able to provide physical care for the Pt. Following CIR as he is also disabled. I will reach out to other family to see if they are able to provide support. If Pt. Does not have support at discharge, she may need SNF.  Clemens Catholic, [place], [place] Admissions Coordinator  [phone] ([place]) [phone] (office)

## 2020-10-01 NOTE — Progress Notes (Addendum)
Progress Note  Patient Name: Tracey Meyer Date of Encounter: 10/01/2020  [facility] Cardiologist: Peter Martinique, MD   Subjective   Feeling okay this morning. Ready to move on to rehab and eventually home. No new complaints overnight. Denies chest pain, SOB, or palpitations.   Inpatient Medications    Scheduled Meds:   stroke: mapping our early stages of recovery book   Does not apply Once   aspirin EC  81 mg Oral Daily   enoxaparin (LOVENOX) injection  40 mg Subcutaneous Q24H   insulin aspart  0-5 Units Subcutaneous QHS   insulin aspart  0-9 Units Subcutaneous TID WC   rosuvastatin  40 mg Oral Daily   ticagrelor  90 mg Oral BID   Continuous Infusions:  sodium chloride     PRN Meds: acetaminophen **OR** acetaminophen (TYLENOL) oral liquid 160 mg/5 mL **OR** acetaminophen, alum & mag hydroxide-simeth   Vital Signs    Vitals:   09/30/20 2014 10/01/20 0051 10/01/20 0402 10/01/20 0500  BP: 137/89 135/87 (!) 155/70   Pulse: 71 70 63   Resp: 17 16 12    Temp: 97.8 F (36.6 C) 98 F (36.7 C) 98.1 F (36.7 C)   TempSrc: Oral Oral Oral   SpO2: 97% 98% 100%   Weight:    69 kg  Height:        Intake/Output Summary (Last 24 hours) at 10/01/2020 0810 Last data filed at 10/01/2020 0520 Gross per 24 hour  Intake 100 ml  Output 300 ml  Net -200 ml   Last 3 Weights 10/01/2020 09/30/2020 09/29/2020  Weight (lbs) 152 lb 1.9 oz 153 lb 3.5 oz 155 lb 3.3 oz  Weight (kg) 69 kg 69.5 kg 70.4 kg      Telemetry    Sinus rhythm with occasional PVCs - Personally Reviewed  ECG    No new tracings - Personally Reviewed  Physical Exam   GEN: No acute distress.   Neck: No JVD Cardiac: RRR, no murmurs, rubs, or gallops.  Respiratory: Clear to auscultation bilaterally. GI: Soft, nontender, non-distended  MS: No edema; No deformity. Neuro:  residual left sided paralysis  Psych: Normal affect   Labs    High Sensitivity Troponin:   Recent Labs  Lab 09/22/20 1233  09/27/20 1900 09/27/20 2116  TROPONINIHS >24,000* 15,211* 13,694*      Chemistry Recent Labs  Lab 09/27/20 1900 09/27/20 1950 09/29/20 0912  NA 134* 138 135  K 3.8 3.8 4.1  CL 104 108 104  CO2 18*  --  22  GLUCOSE 205* 201* 145*  BUN 26* 26* 24*  CREATININE 1.30* 1.20* 1.10*  CALCIUM 9.7  --  9.6  PROT 6.8  --   --   ALBUMIN 3.0*  --   --   AST 28  --   --   ALT 26  --   --   ALKPHOS 47  --   --   BILITOT 0.5  --   --   GFRNONAA 47*  --  58*  ANIONGAP 12  --  9     Hematology Recent Labs  Lab 09/27/20 1900 09/27/20 1950  WBC 6.9  --   RBC 3.86*  --   HGB 12.1 12.2  HCT 36.4 36.0  MCV 94.3  --   MCH 31.3  --   MCHC 33.2  --   RDW 13.1  --   PLT 352  --     BNP Recent Labs  Lab 09/27/20 1916  BNP 724.0*     DDimer No results for input(s): DDIMER in the last 168 hours.   Radiology    No results found.  Cardiac Studies   Echo 09/22/20:  1. Apex well-visualized without contrast - false tendon (normal variant)  - no thrombus. Left ventricular ejection fraction, by estimation, is 35 to  40%. The left ventricle has moderately decreased function. The left  ventricle demonstrates regional wall  motion abnormalities (see scoring diagram/findings for description). There  is mild left ventricular hypertrophy. Left ventricular diastolic  parameters are consistent with Grade I diastolic dysfunction (impaired  relaxation). Elevated left ventricular  end-diastolic pressure. There is severe hypokinesis of the left  ventricular, entire anterior wall, anteroseptal wall, apical segment and  inferoapical segment. Findings suggest LAD territory ischemia/infarct.   2. Right ventricular systolic function is normal. The right ventricular  size is normal. There is normal pulmonary artery systolic pressure.   3. The mitral valve is abnormal. Mild mitral valve regurgitation.   4. The aortic valve is tricuspid. Aortic valve regurgitation is not  visualized.   5. The  inferior vena cava is normal in size with <50% respiratory  variability, suggesting right atrial pressure of 8 mmHg.   LHC 09/22/20: RPDA lesion is 50% stenosed. Dist Cx lesion is 30% stenosed with 30% stenosed side branch in LPAV. 1st Diag lesion is 60% stenosed. Mid LAD lesion is 100% stenosed. Post intervention, there is a 0% residual stenosis. A drug-eluting stent was successfully placed using a STENT ONYX FRONTIER [id number]. LV end diastolic pressure is mildly elevated.   1. 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 Mildly elevated LVEDP 21 mmHg 3. Successful PCI of the mid LAD with DES x 1   Diagnostic Dominance: Co-dominant      Intervention         Echocardiogram 09/28/20: 1. Left ventricular ejection fraction, by estimation, is 30 to 35%. The  left ventricle has moderately decreased function. The left ventricle  demonstrates regional wall motion abnormalities (see scoring  diagram/findings for description). There is mild  left ventricular hypertrophy. Left ventricular diastolic parameters are  consistent with Grade II diastolic dysfunction (pseudonormalization).  Elevated left atrial pressure.   2. No LV thrombus seen   3. Right ventricular systolic function is normal. The right ventricular  size is normal. Tricuspid regurgitation signal is inadequate for assessing  PA pressure.   4. The mitral valve is normal in structure. Trivial mitral valve  regurgitation.   5. The aortic valve was not well visualized. Aortic valve regurgitation  is not visualized. No aortic stenosis is present.   6. The inferior vena cava is normal in size with greater than 50%  respiratory variability, suggesting right atrial pressure of 3 mmHg. _____________  Patient Profile     60 y.o. female  with a PMH of recent STEMI s/p PCI/DES to LAD 09/21/20, ICM/Chronic combined CHF, HTN, HLD, DM type 2, who presented with left sided weakness, found to have an acute CVA.   Assessment &  Plan    1. Acute CVA: patient presented with left sided weakness which she reports starting 09/24/20 after returning home from a hospital stay for STEMI earlier that day. Found to have acute/subacute non-hemorrhagic strokes on MRI. MRA neck showed 70% L ICA stenosis for which outpatient vascular surgery follow-up is recommended. Neurology following and recommending permissive HTN. Suspicions high for cardioembolic source. Echo this admission without LV thrombus. Schedule is full for TEE today, tentatively planned for  tomorrow.  - Okay to eat today. Will make NPO after MN for TEE 10/02/20 at 1:30pm with Dr. Stanford Breed - Shared Decision Making/Informed Consent{ The risks [esophageal damage, perforation (1:10,000 risk), bleeding, pharyngeal hematoma as well as other potential complications associated with conscious sedation including aspiration, arrhythmia, respiratory failure and death], benefits (treatment guidance and diagnostic support) and alternatives of a transesophageal echocardiogram were discussed in detail with Ms. Diego and she is willing to proceed.  - Continue to hold antihypertensives at this time to allow for permissive HTN - plan to restart when cleared by neurology, hopefully prior to discharge to CIR - Continue aspirin and brilinta - Continue statin   2. Recent STEMI: patient presented with anterior STEMI-delayed presentation 09/21/20 and underwent LHC with PCI/DES to LAD. She had an ICM with EF 35-40% with anterior hypokinesis but no apical thrombus at that time. - Continue aspirin and brilinta - Continue statin   3. ICM/Chronic combined CHF: Echo 09/22/20 at the time of her STEMI showed EF 35-40% with severe hypokinesis of the anterior wall, G1DD, mild LVH, and mild MR. She was recently started on carvedilol, entresto, and spironolactone. Patient reported orthopnea and SOB on this admission. BNP 724, up from 600s 1 week ago. CXR clear lungs with no acute findings. Echo this admission with  EF 30-35%, G2DD, ongoing WMA, and no evidence of LV thrombus. Home BP meds on hold for permissive hypertension in the setting of #1. She appears euvolemic on exam today. - Anticipate restarting carvedilol, entresto, and spironolactone when cleared by neurology - hopeful to restart in the next 24-48 hours - Monitor volume status closely with daily weights and strict I&Os   4. HLD: LDL 130 this admission; recently started on low dose crestor 1 week ago. LFTs normal this admission - Agree with increasing crestor to 40mg  daily - Will need repeat FLP/LFTs in 6-8 weeks        For questions or updates, please contact [place] Please consult [email or web address] for contact info under     Signed, Abigail Butts, PA-C  10/01/2020, 8:10 AM    History and all data above reviewed.  Patient examined.  I agree with the findings as above.  She denies pain.  Breathing OK.  The patient exam reveals COR:RRR  ,  Lungs: Clear  ,  Abd: Positive bowel sounds, no rebound no guarding, Ext No edema , Neuro:  Left hemiparesis  .  All available labs, radiology testing, previous records reviewed. Agree with documented assessment and plan. Anterior MI with resultant ischemic cardiomyopathy:  When we are able to allow her BP to start coming down we will slowly titrate her meds.  Possible TEE tomorrow depending on schedule.  For now on ASA/ticagrelor.  Jeneen Rinks Arneta Mahmood  11:16 AM  10/01/2020

## 2020-10-01 NOTE — H&P (View-Only) (Signed)
PROGRESS NOTE    Tracey Meyer   VHQ:[id number]  DOB: 07/29/1960  DOA: 09/27/2020 PCP: Pcp, No   Brief Narrative:  Tracey Meyer is a 60 y/o F with CAD (recent stent to the LAD on 7/4), ischemic cardiomyopathy with EF of 35-40%, HTN, DM2, HLD and smoking who presented to the ED with shortness of breath and orthopnea.  She also stated that on the evening of 7/4, she developed left sided weakness and slurred speech which did not improve.   In ED: CT head> 18 x 16 x 17 mm focus of peripheral hypo attenuation in the posterior right parietooccipital region. This is nonspecific and could be related to subacute infarct, but given the somewhat rounded configuration, MRI of the brain without and with contrast recommended to further evaluate.  Cardiology and neurology consulted. IV Lasix started and MRI/ MRA ordered. Admitted to treat acute CHF and work up neurological symptoms.  Subjective: No new complaints.   I was sent a message on secure chat by RN that there was vaginal bleeding I> pink substance in bed pan about the size of a half dollar- per nursing assistant, it occurred on 7/10 as well- patient was unaware of bleeding- follow    Assessment & Plan:   Principal Problem:   Acute ischemic strokes- multifocal and bilateral   Hyperlipidemia -symptoms occurred on the night of 7/4 after she got home from the hospital - MRI > Multiple scattered acute to early subacute ischemic nonhemorrhagic infarcts involving the bilateral cerebral and cerebellar hemispheres as above, with largest area of infarction measuring up to 2 cm at the right occipital cortex. Given the various vascular distributions involved, a central thromboembolic etiology is suspected. - CTA neck showed Left ICA 70% stenosis -LDL is 130-Crestor started after MI-being increased from 10 mg this admission to 40 mg daily -A1c is 6.8 -2D echo does not reveal a thrombus   -Neurology recommending to continue 81 mg  aspirin and Brilinta -TEE recommended by neurology - if TEE unrevealing, 30 day event monitor recommended -Therapy recommends CIR-I have consulted them - SLP eval done- mild aspiration risk- regular diet with thin liquid recommended and MBS ordered - SLUMs is 17/30 - 7/11> TEE scheduled for tomorrow- MBS completed and SLP recommending regular diet and thin liquids, asked RN to place Prevalon boot on left foot to prevent pressure ulcer and to elevate left arm to prevent dependent edema  Active Problems: Left ICA stenosis - MRA>> Approximate 70% atheromatous stenosis at the origin of the left ICA- clearly not the cause of current CVAs - Spoke with vascular surgery. Dr Carlis Abbott to get an appt so she does not fall through the cracks  Chronic systolic heart failure (mild) CAD with NSTEMI and stent in mid LAD on 09/22/2020 -Echo 09/22/2020> EF 35 to 40%, mild LVH, grade 1 diastolic dysfunction, severe hypokinesis of the LV entire anterior wall anteroseptal wall apical segment and inferior apical segment. -Echo 09/28/2020> EF 30 to 41%, grade 2 diastolic dysfunction -Given 20 mg of IV Lasix on 7/7 and no diuretics since - Continue aspirin, Brilinta, Crestor -Antihypertensives being held to allow for permissive hypertension- BP 155/70 and then 128/79 today -Cardiology is following  Urinary retention - 7/8 Patient had the urge to urinate but was unable to-bladder scan revealed 700 cc- In-N-Out cath done and 600 cc of urine obtained  - no further I and O caths needed  CKD 3B - Cr has mostly been ~ 1.0-1.2  in hospital - Cr  was 0.80 on 7/2- I have no labs prior to this      DM (diabetes mellitus), type 2 ([place]) -On metformin at home- sugars < 150 here in the hospital - she is eating most of her meals-   -Hemoglobin A1C    Component Value Date/Time   HGBA1C 6.8 (H) 09/28/2020 0403   Tobacco abuse - Smoking cessation counseling given  Time spent in minutes: 35 DVT prophylaxis: enoxaparin (LOVENOX)  injection 40 mg Start: 09/28/20 0800  Code Status: full code Family Communication:  Level of Care: Level of care: Telemetry Cardiac Disposition Plan:  Status is: Inpatient  Remains inpatient appropriate because:Inpatient level of care appropriate due to severity of illness  Dispo: The patient is from: Home              Anticipated d/c is to:  CIR              Patient currently is not medically stable to d/c.   Difficult to place patient Yes      Consultants:  Cardiology Neurology  Procedures:  none Antimicrobials:  Anti-infectives (From admission, onward)    None        Objective: Vitals:   10/01/20 0051 10/01/20 0402 10/01/20 0500 10/01/20 1134  BP: 135/87 (!) 155/70  128/79  Pulse: 70 63  70  Resp: 16 12  20   Temp: 98 F (36.7 C) 98.1 F (36.7 C)  98.1 F (36.7 C)  TempSrc: Oral Oral  Oral  SpO2: 98% 100%    Weight:   69 kg   Height:        Intake/Output Summary (Last 24 hours) at 10/01/2020 1327 Last data filed at 10/01/2020 0520 Gross per 24 hour  Intake 100 ml  Output 300 ml  Net -200 ml    Filed Weights   09/29/20 0421 09/30/20 0438 10/01/20 0500  Weight: 70.4 kg 69.5 kg 69 kg    Examination: General exam: Appears comfortable  HEENT: PERRLA, oral mucosa moist, no sclera icterus or thrush Respiratory system: Clear to auscultation. Respiratory effort normal. Cardiovascular system: S1 & S2 heard, regular rate and rhythm Gastrointestinal system: Abdomen soft, non-tender, nondistended. Normal bowel sounds   Central nervous system: Alert and oriented. Strength 0/5 in Left side Extremities: No cyanosis, clubbing or edema Skin: No rashes or ulcers Psychiatry:  Mood & affect appropriate.      Data Reviewed: I have personally reviewed following labs and imaging studies  CBC: Recent Labs  Lab 09/27/20 1900 09/27/20 1950  WBC 6.9  --   NEUTROABS 4.9  --   HGB 12.1 12.2  HCT 36.4 36.0  MCV 94.3  --   PLT 352  --     Basic Metabolic  Panel: Recent Labs  Lab 09/27/20 1900 09/27/20 1950 09/29/20 0912  NA 134* 138 135  K 3.8 3.8 4.1  CL 104 108 104  CO2 18*  --  22  GLUCOSE 205* 201* 145*  BUN 26* 26* 24*  CREATININE 1.30* 1.20* 1.10*  CALCIUM 9.7  --  9.6    GFR: Estimated Creatinine Clearance: 52.5 mL/min (A) (by C-G formula based on SCr of 1.1 mg/dL (H)). Liver Function Tests: Recent Labs  Lab 09/27/20 1900  AST 28  ALT 26  ALKPHOS 47  BILITOT 0.5  PROT 6.8  ALBUMIN 3.0*    No results for input(s): LIPASE, AMYLASE in the last 168 hours. No results for input(s): AMMONIA in the last 168 hours. Coagulation Profile: Recent Labs  Lab 09/27/20 1900  INR 1.0    Cardiac Enzymes: No results for input(s): CKTOTAL, CKMB, CKMBINDEX, TROPONINI in the last 168 hours. BNP (last 3 results) No results for input(s): PROBNP in the last 8760 hours. HbA1C: No results for input(s): HGBA1C in the last 72 hours.  CBG: Recent Labs  Lab 09/30/20 1155 09/30/20 1606 09/30/20 2114 10/01/20 0751 10/01/20 1133  GLUCAP 134* 129* 146* 138* 175*    Lipid Profile: No results for input(s): CHOL, HDL, LDLCALC, TRIG, CHOLHDL, LDLDIRECT in the last 72 hours.  Thyroid Function Tests: No results for input(s): TSH, T4TOTAL, FREET4, T3FREE, THYROIDAB in the last 72 hours. Anemia Panel: No results for input(s): VITAMINB12, FOLATE, FERRITIN, TIBC, IRON, RETICCTPCT in the last 72 hours. Urine analysis:    Component Value Date/Time   COLORURINE YELLOW 09/28/2020 1528   APPEARANCEUR CLEAR 09/28/2020 1528   LABSPEC 1.020 09/28/2020 1528   PHURINE 5.5 09/28/2020 1528   GLUCOSEU NEGATIVE 09/28/2020 1528   HGBUR NEGATIVE 09/28/2020 [place] 09/28/2020 [place] 09/28/2020 1528   PROTEINUR NEGATIVE 09/28/2020 1528   NITRITE POSITIVE (A) 09/28/2020 1528   LEUKOCYTESUR NEGATIVE 09/28/2020 1528   Sepsis Labs: @LABRCNTIP (procalcitonin:4,lacticidven:4) ) Recent Results (from the past 240  hour(s))  Resp Panel by RT-PCR (Flu A&B, Covid) Nasopharyngeal Swab     Status: None   Collection Time: 09/22/20  9:18 AM   Specimen: Nasopharyngeal Swab; Nasopharyngeal(NP) swabs in vial transport medium  Result Value Ref Range Status   SARS Coronavirus 2 by RT PCR NEGATIVE NEGATIVE Final    Comment: (NOTE) SARS-CoV-2 target nucleic acids are NOT DETECTED.  The SARS-CoV-2 RNA is generally detectable in upper respiratory specimens during the acute phase of infection. The lowest concentration of SARS-CoV-2 viral copies this assay can detect is 138 copies/mL. A negative result does not preclude SARS-Cov-2 infection and should not be used as the sole basis for treatment or other patient management decisions. A negative result may occur with  improper specimen collection/handling, submission of specimen other than nasopharyngeal swab, presence of viral mutation(s) within the areas targeted by this assay, and inadequate number of viral copies(<138 copies/mL). A negative result must be combined with clinical observations, patient history, and epidemiological information. The expected result is Negative.  Fact Sheet for Patients:  EntrepreneurPulse.com.au  Fact Sheet for Healthcare Providers:  IncredibleEmployment.be  This test is no t yet approved or cleared by the [place] FDA and  has been authorized for detection and/or diagnosis of SARS-CoV-2 by FDA under an Emergency Use Authorization (EUA). This EUA will remain  in effect (meaning this test can be used) for the duration of the COVID-19 declaration under Section 564(b)(1) of the Act, 21 U.S.C.section 360bbb-3(b)(1), unless the authorization is terminated  or revoked sooner.       Influenza A by PCR NEGATIVE NEGATIVE Final   Influenza B by PCR NEGATIVE NEGATIVE Final    Comment: (NOTE) The Xpert Xpress SARS-CoV-2/FLU/RSV plus assay is intended as an aid in the diagnosis of influenza from  Nasopharyngeal swab specimens and should not be used as a sole basis for treatment. Nasal washings and aspirates are unacceptable for Xpert Xpress SARS-CoV-2/FLU/RSV testing.  Fact Sheet for Patients: EntrepreneurPulse.com.au  Fact Sheet for Healthcare Providers: IncredibleEmployment.be  This test is not yet approved or cleared by the [place] FDA and has been authorized for detection and/or diagnosis of SARS-CoV-2 by FDA under an Emergency Use Authorization (EUA). This EUA will remain in effect (meaning this test  can be used) for the duration of the COVID-19 declaration under Section 564(b)(1) of the Act, 21 U.S.C. section 360bbb-3(b)(1), unless the authorization is terminated or revoked.  Performed at [facility], [place] [address] [place], [place] [address]   MRSA Next Gen by PCR, Nasal     Status: None   Collection Time: 09/22/20 10:59 AM   Specimen: Nasal Mucosa; Nasal Swab  Result Value Ref Range Status   MRSA by PCR Next Gen NOT DETECTED NOT DETECTED Final    Comment: (NOTE) The GeneXpert MRSA Assay (FDA approved for NASAL specimens only), is one component of a comprehensive MRSA colonization surveillance program. It is not intended to diagnose MRSA infection nor to guide or monitor treatment for MRSA infections. Test performance is not FDA approved in patients less than 15 years old. Performed at [facility], [place] [address], [place], [place] [address]   Resp Panel by RT-PCR (Flu A&B, Covid) Nasopharyngeal Swab     Status: None   Collection Time: 09/27/20  6:54 PM   Specimen: Nasopharyngeal Swab; Nasopharyngeal(NP) swabs in vial transport medium  Result Value Ref Range Status   SARS Coronavirus 2 by RT PCR NEGATIVE NEGATIVE Final    Comment: (NOTE) SARS-CoV-2 target nucleic acids are NOT DETECTED.  The SARS-CoV-2 RNA is generally detectable in upper respiratory specimens during the acute phase of infection. The  lowest concentration of SARS-CoV-2 viral copies this assay can detect is 138 copies/mL. A negative result does not preclude SARS-Cov-2 infection and should not be used as the sole basis for treatment or other patient management decisions. A negative result may occur with  improper specimen collection/handling, submission of specimen other than nasopharyngeal swab, presence of viral mutation(s) within the areas targeted by this assay, and inadequate number of viral copies(<138 copies/mL). A negative result must be combined with clinical observations, patient history, and epidemiological information. The expected result is Negative.  Fact Sheet for Patients:  EntrepreneurPulse.com.au  Fact Sheet for Healthcare Providers:  IncredibleEmployment.be  This test is no t yet approved or cleared by the [place] FDA and  has been authorized for detection and/or diagnosis of SARS-CoV-2 by FDA under an Emergency Use Authorization (EUA). This EUA will remain  in effect (meaning this test can be used) for the duration of the COVID-19 declaration under Section 564(b)(1) of the Act, 21 U.S.C.section 360bbb-3(b)(1), unless the authorization is terminated  or revoked sooner.       Influenza A by PCR NEGATIVE NEGATIVE Final   Influenza B by PCR NEGATIVE NEGATIVE Final    Comment: (NOTE) The Xpert Xpress SARS-CoV-2/FLU/RSV plus assay is intended as an aid in the diagnosis of influenza from Nasopharyngeal swab specimens and should not be used as a sole basis for treatment. Nasal washings and aspirates are unacceptable for Xpert Xpress SARS-CoV-2/FLU/RSV testing.  Fact Sheet for Patients: EntrepreneurPulse.com.au  Fact Sheet for Healthcare Providers: IncredibleEmployment.be  This test is not yet approved or cleared by the [place] FDA and has been authorized for detection and/or diagnosis of SARS-CoV-2 by FDA under  an Emergency Use Authorization (EUA). This EUA will remain in effect (meaning this test can be used) for the duration of the COVID-19 declaration under Section 564(b)(1) of the Act, 21 U.S.C. section 360bbb-3(b)(1), unless the authorization is terminated or revoked.  Performed at [facility], [place] [address], [place], [place] [address]           Radiology Studies: No results found.  Scheduled Meds:   stroke: mapping our early stages of recovery book   Does not apply Once   aspirin EC  81 mg Oral Daily   enoxaparin (LOVENOX) injection  40 mg Subcutaneous Q24H   insulin aspart  0-5 Units Subcutaneous QHS   insulin aspart  0-9 Units Subcutaneous TID WC   rosuvastatin  40 mg Oral Daily   ticagrelor  90 mg Oral BID   Continuous Infusions:  sodium chloride       LOS: 4 days      Debbe Odea, MD [facility] Pager: [email or web address] 10/01/2020, 1:27 PM

## 2020-10-01 NOTE — Progress Notes (Signed)
Modified Barium Swallow Progress Note  Patient Details  Name: Tracey Meyer MRN: [id number] Date of Birth: 06-22-1960  Today's Date: 10/01/2020  Modified Barium Swallow completed.  Full report located under Chart Review in the Imaging Section.  Brief recommendations include the following:  Clinical Impression  Pt presents with functional oropharyngeal swallowing with good timing and efficiency so that she does not have any residuals or penetration/aspiration. She did have one moment of strong coughing that occurred shortly after swallowing, and she did acknowledge that this is what has been happening during meals, but there was no clear source for this coughing based on oropharyngeal function. Recommend continuing with regular solids and thin liquids as tolerated. SLP will f/u briefly given subjective symptoms.   Swallow Evaluation Recommendations       SLP Diet Recommendations: Regular solids;Thin liquid   Liquid Administration via: Cup;Straw   Medication Administration: Whole meds with liquid   Supervision: Patient able to self feed;Intermittent supervision to cue for compensatory strategies   Compensations: Slow rate;Small sips/bites   Postural Changes: Seated upright at 90 degrees   Oral Care Recommendations: Oral care BID        Osie Bond., M.A. [place] Pager [phone] Office [phone]  10/01/2020,12:16 PM

## 2020-10-01 NOTE — Plan of Care (Signed)
  Problem: Education: Goal: Knowledge of General Education information will improve Description: Including pain rating scale, medication(s)/side effects and non-pharmacologic comfort measures Outcome: Progressing   Problem: Health Behavior/Discharge Planning: Goal: Ability to manage health-related needs will improve Outcome: Progressing   Problem: Clinical Measurements: Goal: Ability to maintain clinical measurements within normal limits will improve Outcome: Progressing Goal: Will remain free from infection Outcome: Progressing Goal: Diagnostic test results will improve Outcome: Progressing Goal: Respiratory complications will improve Outcome: Progressing Goal: Cardiovascular complication will be avoided Outcome: Progressing   Problem: Activity: Goal: Risk for activity intolerance will decrease Outcome: Progressing   Problem: Nutrition: Goal: Adequate nutrition will be maintained Outcome: Progressing   Problem: Coping: Goal: Level of anxiety will decrease Outcome: Progressing   Problem: Elimination: Goal: Will not experience complications related to bowel motility Outcome: Progressing Goal: Will not experience complications related to urinary retention Outcome: Progressing   Problem: Pain Managment: Goal: General experience of comfort will improve Outcome: Progressing   Problem: Safety: Goal: Ability to remain free from injury will improve Outcome: Progressing   Problem: Skin Integrity: Goal: Risk for impaired skin integrity will decrease Outcome: Progressing   Problem: Education: Goal: Knowledge of disease or condition will improve Outcome: Progressing Goal: Knowledge of secondary prevention will improve Outcome: Progressing Goal: Knowledge of patient specific risk factors addressed and post discharge goals established will improve Outcome: Progressing Goal: Individualized Educational Video(s) Outcome: Progressing   Problem: Coping: Goal: Will verbalize  positive feelings about self Outcome: Progressing Goal: Will identify appropriate support needs Outcome: Progressing   Problem: Health Behavior/Discharge Planning: Goal: Ability to manage health-related needs will improve Outcome: Progressing   Problem: Self-Care: Goal: Ability to participate in self-care as condition permits will improve Outcome: Progressing Goal: Ability to communicate needs accurately will improve Outcome: Progressing

## 2020-10-01 NOTE — Anesthesia Preprocedure Evaluation (Addendum)
Anesthesia Evaluation  Patient identified by MRN, date of birth, ID band Patient awake    Reviewed: Allergy & Precautions, NPO status , Patient's Chart, lab work & pertinent test results  History of Anesthesia Complications Negative for: history of anesthetic complications  Airway Mallampati: II  TM Distance: >3 FB Neck ROM: Full    Dental  (+) Dental Advisory Given, Poor Dentition   Pulmonary Current Smoker,    Pulmonary exam normal        Cardiovascular hypertension, Normal cardiovascular exam  1. Left ventricular ejection fraction, by estimation, is 30 to 35%. The  left ventricle has moderately decreased function. The left ventricle  demonstrates regional wall motion abnormalities (see scoring  diagram/findings for description). There is mild  left ventricular hypertrophy. Left ventricular diastolic parameters are  consistent with Grade II diastolic dysfunction (pseudonormalization).  Elevated left atrial pressure.  2. No LV thrombus seen  3. Right ventricular systolic function is normal. The right ventricular  size is normal. Tricuspid regurgitation signal is inadequate for assessing  PA pressure.  4. The mitral valve is normal in structure. Trivial mitral valve  regurgitation.  5. The aortic valve was not well visualized. Aortic valve regurgitation  is not visualized. No aortic stenosis is present.  6. The inferior vena cava is normal in size with greater than 50%  respiratory variability, suggesting right atrial pressure of 3 mmHg.    Neuro/Psych CVA    GI/Hepatic negative GI ROS, Neg liver ROS,   Endo/Other  diabetes  Renal/GU Renal InsufficiencyRenal disease     Musculoskeletal negative musculoskeletal ROS (+)   Abdominal   Peds  Hematology negative hematology ROS (+)   Anesthesia Other Findings   Reproductive/Obstetrics                            Anesthesia  Physical Anesthesia Plan  ASA: 3  Anesthesia Plan: General   Post-op Pain Management:    Induction:   PONV Risk Score and Plan: 2 and Ondansetron and TIVA  Airway Management Planned: Natural Airway  Additional Equipment:   Intra-op Plan:   Post-operative Plan:   Informed Consent: I have reviewed the patients History and Physical, chart, labs and discussed the procedure including the risks, benefits and alternatives for the proposed anesthesia with the patient or authorized representative who has indicated his/her understanding and acceptance.     Dental advisory given  Plan Discussed with: Anesthesiologist and CRNA  Anesthesia Plan Comments:        Anesthesia Quick Evaluation

## 2020-10-01 NOTE — Progress Notes (Signed)
STROKE TEAM PROGRESS NOTE   INTERVAL HISTORY Patient is lying in bed, still has left hemiplegia.  Cardiology plan to have TEE tomorrow.  Vascular surgery consulted for carotid stenosis, will do CTA head and neck to better characterize vascular stenosis.  Vitals:   10/01/20 0051 10/01/20 0402 10/01/20 0500 10/01/20 1134  BP: 135/87 (!) 155/70  128/79  Pulse: 70 63  70  Resp: 16 12  20   Temp: 98 F (36.7 C) 98.1 F (36.7 C)  98.1 F (36.7 C)  TempSrc: Oral Oral  Oral  SpO2: 98% 100%    Weight:   69 kg   Height:       CBC:  Recent Labs  Lab 09/27/20 1900 09/27/20 1950  WBC 6.9  --   NEUTROABS 4.9  --   HGB 12.1 12.2  HCT 36.4 36.0  MCV 94.3  --   PLT 352  --    Basic Metabolic Panel:  Recent Labs  Lab 09/27/20 1900 09/27/20 1950 09/29/20 0912  NA 134* 138 135  K 3.8 3.8 4.1  CL 104 108 104  CO2 18*  --  22  GLUCOSE 205* 201* 145*  BUN 26* 26* 24*  CREATININE 1.30* 1.20* 1.10*  CALCIUM 9.7  --  9.6   Lipid Panel:  Recent Labs  Lab 09/28/20 0404  CHOL 186  TRIG 100  HDL 36*  CHOLHDL 5.2  VLDL 20  LDLCALC 130*   HgbA1c:  Recent Labs  Lab 09/28/20 0403  HGBA1C 6.8*    Alcohol Level  Recent Labs  Lab 09/27/20 1938  ETH <10    IMAGING past 24 hours DG Chest 2 View  Result Date: 09/23/2020 CLINICAL DATA:  Question right lung pneumonia on outside radiograph. EXAM: CHEST - 2 VIEW COMPARISON:  Radiograph yesterday at [facility]. FINDINGS: Lung volumes are low. Borderline cardiomegaly is similar. There is improved right basilar aeration with mild residual atelectasis. No new airspace disease. There is minimal fluid in the fissures without large subpulmonic effusion. No pneumothorax. Stable osseous structures. IMPRESSION: 1. Improved aeration at the right lung base with mild residual atelectasis. No new airspace disease. 2. Trace fluid in the fissures.  Borderline cardiomegaly. Electronically Signed   By: Keith Rake M.D.   On: 09/23/2020 17:51    CT HEAD WO CONTRAST  Result Date: 09/27/2020 CLINICAL DATA:  Left-sided weakness. EXAM: CT HEAD WITHOUT CONTRAST TECHNIQUE: Contiguous axial images were obtained from the base of the skull through the vertex without intravenous contrast. COMPARISON:  None. FINDINGS: Brain: 18 x 16 x 17 mm focus of peripheral hypo attenuation is identified in posterior right parietooccipital region (image 15/series 2). No evidence for acute hemorrhage, hydrocephalus, or abnormal extra-axial fluid collection. Patchy low attenuation in the deep hemispheric and periventricular white matter is nonspecific, but likely reflects chronic microvascular ischemic demyelination. Vascular: No hyperdense vessel or unexpected calcification. Skull: No evidence for fracture. No worrisome lytic or sclerotic lesion. Sinuses/Orbits: The visualized paranasal sinuses and mastoid air cells are clear. Visualized portions of the globes and intraorbital fat are unremarkable. Other: None. IMPRESSION: 1. 18 x 16 x 17 mm focus of peripheral hypo attenuation in the posterior right parietooccipital region. This is nonspecific and could be related to subacute infarct, but given the somewhat rounded configuration, MRI of the brain without and with contrast recommended to further evaluate. 2. Chronic small vessel white matter ischemic disease. Electronically Signed   By: Misty Stanley M.D.   On: 09/27/2020 19:42   MR ANGIO  HEAD WO CONTRAST  Result Date: 09/28/2020 CLINICAL DATA:  Initial evaluation for neuro deficit, stroke suspected, left-sided weakness. EXAM: MRI HEAD WITHOUT AND WITH CONTRAST MRA HEAD WITHOUT CONTRAST MRA NECK WITHOUT AND WITH CONTRAST TECHNIQUE: Multiplanar, multi-echo pulse sequences of the brain and surrounding structures were acquired without intravenous contrast. Angiographic images of the Circle of Willis were acquired using MRA technique without intravenous contrast. Angiographic images of the neck were acquired using MRA technique  without and with intravenous contrast. Carotid stenosis measurements (when applicable) are obtained utilizing NASCET criteria, using the distal internal carotid diameter as the denominator. CONTRAST:  14mL GADAVIST GADOBUTROL 1 MMOL/ML IV SOLN COMPARISON:  Prior CT from 09/27/2020. FINDINGS: MRI HEAD FINDINGS Brain: Examination moderately degraded by motion artifact. Cerebral volume within normal limits. Patchy T2/FLAIR hyperintensity seen involving the periventricular deep white matter of both cerebral hemispheres, with patchy involvement of the thalami and pons, most consistent with chronic microvascular ischemic disease, moderate in nature. Few scatter remote lacunar infarcts present at the left thalamus and pons. Multiple scattered foci of restricted diffusion seen involving the bilateral cerebral and cerebellar hemispheres, consistent with acute to early subacute ischemic infarcts. The largest infarct involving the left cerebral hemisphere seen involving the cortical and subcortical posterior left frontal lobe in measures 1.4 cm (series 7, image 51). Largest infarct involving the right cerebral hemisphere involves the right occipital cortex and measures 2 cm (series 5, image 74). This accounts for the hypodensity seen on prior CT. Few small cerebellar infarcts measure up to 9 mm on the right. Patchy infarct measuring 1.2 cm seen involving the central/right paracentral medulla (series 5, image 62). No associated hemorrhage or mass effect. Minimal associated enhancement seen about the dominant right occipital infarct. No mass lesion, midline shift, or significant mass effect. No hydrocephalus or extra-axial fluid collection. Pituitary gland suprasellar region within normal limits. Midline structures intact. No other abnormal enhancement. Vascular: Major intracranial vascular flow voids are maintained. Skull and upper cervical spine: Craniocervical junction within normal limits. Bone marrow signal intensity normal.  No scalp soft tissue abnormality. Sinuses/Orbits: Globes and orbital soft tissues demonstrate no acute finding. Mild scattered mucosal thickening noted within the ethmoidal air cells. Paranasal sinuses are otherwise clear. Trace bilateral mastoid effusions noted, of doubtful significance. Inner ear structures grossly normal. Other: None. MRA HEAD FINDINGS Anterior circulation: Examination degraded by motion artifact. Visualized distal cervical segments of the internal carotid arteries are patent with antegrade flow. Petrous segments patent bilaterally. Atheromatous irregularity throughout the carotid siphons bilaterally. No significant stenosis on the left. On the right, there is a probable moderate stenosis involving the proximal cavernous right ICA, better seen on corresponding MRA neck portion of this study (series 4, image 30). A1 segments patent bilaterally. Normal anterior communicating artery complex. Anterior cerebral arteries grossly patent to their distal aspects. No visible M1 stenosis or occlusion. Normal MCA bifurcations. Distal MCA branches well perfused and fairly symmetric. Posterior circulation: Left vertebral artery dominant. Irregular moderate stenosis seen involving the distal left V4 segment just prior to the vertebrobasilar junction, better seen on MRA neck portion of this exam (series 2, image 54). Left PICA not visualized. Right vertebral artery diminutive occludes beyond the takeoff of the right PICA. Partially visualized proximal right PICA patent. Basilar patent proximally. Apparent short-segment severe stenosis involving the mid basilar artery on MIP reconstructions is not seen on corresponding time-of-flight sequence, favored to be artifactual. Mild to moderate stenosis noted at the basilar tip (series 2, image 44). Superior cerebellar arteries patent  bilaterally. Right PCA primarily supplied via the basilar. Left PCA supplied via the basilar as well as a robust left posterior  communicating artery. PCAs perfused to their distal aspects without definite high-grade stenosis. No visible aneurysm on this motion degraded exam. Anatomic variants: Diminutive right vertebral artery largely terminates in PICA. Prominent left posterior communicating artery. MRA NECK FINDINGS Aortic arch: Examination moderately degraded by motion. Visualized aortic arch normal caliber with normal branch pattern. No hemodynamically significant stenosis seen about the origin of the great vessels. Right carotid system: Right CCA patent from its origin to the bifurcation without stenosis. Atheromatous irregularity seen about the right carotid bulb/proximal right ICA with no more than mild 20% stenosis by NASCET criteria. Right ICA patent distally without stenosis, evidence for dissection, or occlusion. Left carotid system: Left CCA patent from its origin to the bifurcation without stenosis. Atheromatous irregularity with up to 70% short-segment stenosis at the origin of the left ICA (series 1060, image 3). Left ICA patent distally without additional stenosis, evidence for dissection or occlusion. Vertebral arteries: Both vertebral arteries arise from subclavian arteries. Vertebral arteries are patent without hemodynamically significant stenosis. No evidence for dissection or occlusion. Other: None IMPRESSION: MRI HEAD: 1. 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 Given the various vascular distributions involved, a central thromboembolic etiology is suspected. 2. Underlying moderate chronic microvascular ischemic disease with a few scattered remote lacunar infarcts involving the left thalamus and pons. MRA HEAD: 1. Technically limited exam due to motion artifact. 2. Negative intracranial MRA for large vessel occlusion. Intracranial  atherosclerotic disease with associated moderate stenoses involving the distal left V4 segment, distal basilar artery, and cavernous right ICA as above. 3. 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 MRA NECK: 1. Technically limited exam due to motion artifact. 2. Approximate 70% atheromatous stenosis at the origin of the left ICA. 3. Otherwise negative MRA of the neck. No other hemodynamically significant or correctable stenosis identified. Electronically Signed   By: Jeannine Boga M.D.   On: 09/28/2020 03:18   MR ANGIO NECK W WO CONTRAST  Result Date: 09/28/2020 CLINICAL DATA:  Initial evaluation for neuro deficit, stroke suspected, left-sided weakness. EXAM: MRI HEAD WITHOUT AND WITH CONTRAST MRA HEAD WITHOUT CONTRAST MRA NECK WITHOUT AND WITH CONTRAST TECHNIQUE: Multiplanar, multi-echo pulse sequences of the brain and surrounding structures were acquired without intravenous contrast. Angiographic images of the Circle of Willis were acquired using MRA technique without intravenous contrast. Angiographic images of the neck were acquired using MRA technique without and with intravenous contrast. Carotid stenosis measurements (when applicable) are obtained utilizing NASCET criteria, using the distal internal carotid diameter as the denominator. CONTRAST:  17mL GADAVIST GADOBUTROL 1 MMOL/ML IV SOLN COMPARISON:  Prior CT from 09/27/2020. FINDINGS: MRI HEAD FINDINGS Brain: Examination moderately degraded by motion artifact. Cerebral volume within normal limits. Patchy T2/FLAIR hyperintensity seen involving the periventricular deep white matter of both cerebral hemispheres, with patchy involvement of the thalami and pons, most consistent with chronic microvascular ischemic disease, moderate in nature. Few scatter remote lacunar infarcts present at the left thalamus and pons. Multiple scattered foci of restricted diffusion seen involving the bilateral cerebral and cerebellar  hemispheres, consistent with acute to early subacute ischemic infarcts. The largest infarct involving the left cerebral hemisphere seen involving the cortical and subcortical posterior left frontal lobe  in measures 1.4 cm (series 7, image 51). Largest infarct involving the right cerebral hemisphere involves the right occipital cortex and measures 2 cm (series 5, image 74). This accounts for the hypodensity seen on prior CT. Few small cerebellar infarcts measure up to 9 mm on the right. Patchy infarct measuring 1.2 cm seen involving the central/right paracentral medulla (series 5, image 62). No associated hemorrhage or mass effect. Minimal associated enhancement seen about the dominant right occipital infarct. No mass lesion, midline shift, or significant mass effect. No hydrocephalus or extra-axial fluid collection. Pituitary gland suprasellar region within normal limits. Midline structures intact. No other abnormal enhancement. Vascular: Major intracranial vascular flow voids are maintained. Skull and upper cervical spine: Craniocervical junction within normal limits. Bone marrow signal intensity normal. No scalp soft tissue abnormality. Sinuses/Orbits: Globes and orbital soft tissues demonstrate no acute finding. Mild scattered mucosal thickening noted within the ethmoidal air cells. Paranasal sinuses are otherwise clear. Trace bilateral mastoid effusions noted, of doubtful significance. Inner ear structures grossly normal. Other: None. MRA HEAD FINDINGS Anterior circulation: Examination degraded by motion artifact. Visualized distal cervical segments of the internal carotid arteries are patent with antegrade flow. Petrous segments patent bilaterally. Atheromatous irregularity throughout the carotid siphons bilaterally. No significant stenosis on the left. On the right, there is a probable moderate stenosis involving the proximal cavernous right ICA, better seen on corresponding MRA neck portion of this study  (series 4, image 30). A1 segments patent bilaterally. Normal anterior communicating artery complex. Anterior cerebral arteries grossly patent to their distal aspects. No visible M1 stenosis or occlusion. Normal MCA bifurcations. Distal MCA branches well perfused and fairly symmetric. Posterior circulation: Left vertebral artery dominant. Irregular moderate stenosis seen involving the distal left V4 segment just prior to the vertebrobasilar junction, better seen on MRA neck portion of this exam (series 2, image 54). Left PICA not visualized. Right vertebral artery diminutive occludes beyond the takeoff of the right PICA. Partially visualized proximal right PICA patent. Basilar patent proximally. Apparent short-segment severe stenosis involving the mid basilar artery on MIP reconstructions is not seen on corresponding time-of-flight sequence, favored to be artifactual. Mild to moderate stenosis noted at the basilar tip (series 2, image 44). Superior cerebellar arteries patent bilaterally. Right PCA primarily supplied via the basilar. Left PCA supplied via the basilar as well as a robust left posterior communicating artery. PCAs perfused to their distal aspects without definite high-grade stenosis. No visible aneurysm on this motion degraded exam. Anatomic variants: Diminutive right vertebral artery largely terminates in PICA. Prominent left posterior communicating artery. MRA NECK FINDINGS Aortic arch: Examination moderately degraded by motion. Visualized aortic arch normal caliber with normal branch pattern. No hemodynamically significant stenosis seen about the origin of the great vessels. Right carotid system: Right CCA patent from its origin to the bifurcation without stenosis. Atheromatous irregularity seen about the right carotid bulb/proximal right ICA with no more than mild 20% stenosis by NASCET criteria. Right ICA patent distally without stenosis, evidence for dissection, or occlusion. Left carotid system:  Left CCA patent from its origin to the bifurcation without stenosis. Atheromatous irregularity with up to 70% short-segment stenosis at the origin of the left ICA (series 1060, image 3). Left ICA patent distally without additional stenosis, evidence for dissection or occlusion. Vertebral arteries: Both vertebral arteries arise from subclavian arteries. Vertebral arteries are patent without hemodynamically significant stenosis. No evidence for dissection or occlusion. Other: None IMPRESSION: MRI HEAD: 1. Multiple scattered acute to early subacute ischemic nonhemorrhagic infarcts involving  the bilateral cerebral and cerebellar hemispheres as above, with largest area of infarction measuring up to 2 cm at the right occipital cortex. Given the various vascular distributions involved, a central thromboembolic etiology is suspected. 2. Underlying moderate chronic microvascular ischemic disease with a few scattered remote lacunar infarcts involving the left thalamus and pons. MRA HEAD: 1. Technically limited exam due to motion artifact. 2. Negative intracranial MRA for large vessel occlusion. Intracranial atherosclerotic disease with associated moderate stenoses involving the distal left V4 segment, distal basilar artery, and cavernous right ICA as above. 3. 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 MRA NECK: 1. Technically limited exam due to motion artifact. 2. Approximate 70% atheromatous stenosis at the origin of the left ICA. 3. Otherwise negative MRA of the neck. No other hemodynamically significant or correctable stenosis identified. Electronically Signed   By: Jeannine Boga M.D.   On: 09/28/2020 03:18   MR Brain W and Wo Contrast  Result Date: 09/28/2020 CLINICAL DATA:  Initial evaluation for neuro deficit, stroke suspected, left-sided weakness. EXAM: MRI HEAD WITHOUT AND WITH CONTRAST MRA HEAD WITHOUT CONTRAST MRA NECK WITHOUT AND WITH CONTRAST TECHNIQUE: Multiplanar,  multi-echo pulse sequences of the brain and surrounding structures were acquired without intravenous contrast. Angiographic images of the Circle of Willis were acquired using MRA technique without intravenous contrast. Angiographic images of the neck were acquired using MRA technique without and with intravenous contrast. Carotid stenosis measurements (when applicable) are obtained utilizing NASCET criteria, using the distal internal carotid diameter as the denominator. CONTRAST:  65mL GADAVIST GADOBUTROL 1 MMOL/ML IV SOLN COMPARISON:  Prior CT from 09/27/2020. FINDINGS: MRI HEAD FINDINGS Brain: Examination moderately degraded by motion artifact. Cerebral volume within normal limits. Patchy T2/FLAIR hyperintensity seen involving the periventricular deep white matter of both cerebral hemispheres, with patchy involvement of the thalami and pons, most consistent with chronic microvascular ischemic disease, moderate in nature. Few scatter remote lacunar infarcts present at the left thalamus and pons. Multiple scattered foci of restricted diffusion seen involving the bilateral cerebral and cerebellar hemispheres, consistent with acute to early subacute ischemic infarcts. The largest infarct involving the left cerebral hemisphere seen involving the cortical and subcortical posterior left frontal lobe in measures 1.4 cm (series 7, image 51). Largest infarct involving the right cerebral hemisphere involves the right occipital cortex and measures 2 cm (series 5, image 74). This accounts for the hypodensity seen on prior CT. Few small cerebellar infarcts measure up to 9 mm on the right. Patchy infarct measuring 1.2 cm seen involving the central/right paracentral medulla (series 5, image 62). No associated hemorrhage or mass effect. Minimal associated enhancement seen about the dominant right occipital infarct. No mass lesion, midline shift, or significant mass effect. No hydrocephalus or extra-axial fluid collection. Pituitary  gland suprasellar region within normal limits. Midline structures intact. No other abnormal enhancement. Vascular: Major intracranial vascular flow voids are maintained. Skull and upper cervical spine: Craniocervical junction within normal limits. Bone marrow signal intensity normal. No scalp soft tissue abnormality. Sinuses/Orbits: Globes and orbital soft tissues demonstrate no acute finding. Mild scattered mucosal thickening noted within the ethmoidal air cells. Paranasal sinuses are otherwise clear. Trace bilateral mastoid effusions noted, of doubtful significance. Inner ear structures grossly normal. Other: None. MRA HEAD FINDINGS Anterior circulation: Examination degraded by motion artifact. Visualized distal cervical segments of the internal carotid arteries are patent with antegrade flow. Petrous segments patent bilaterally. Atheromatous irregularity throughout the carotid siphons bilaterally. No significant stenosis on the  left. On the right, there is a probable moderate stenosis involving the proximal cavernous right ICA, better seen on corresponding MRA neck portion of this study (series 4, image 30). A1 segments patent bilaterally. Normal anterior communicating artery complex. Anterior cerebral arteries grossly patent to their distal aspects. No visible M1 stenosis or occlusion. Normal MCA bifurcations. Distal MCA branches well perfused and fairly symmetric. Posterior circulation: Left vertebral artery dominant. Irregular moderate stenosis seen involving the distal left V4 segment just prior to the vertebrobasilar junction, better seen on MRA neck portion of this exam (series 2, image 54). Left PICA not visualized. Right vertebral artery diminutive occludes beyond the takeoff of the right PICA. Partially visualized proximal right PICA patent. Basilar patent proximally. Apparent short-segment severe stenosis involving the mid basilar artery on MIP reconstructions is not seen on corresponding time-of-flight  sequence, favored to be artifactual. Mild to moderate stenosis noted at the basilar tip (series 2, image 44). Superior cerebellar arteries patent bilaterally. Right PCA primarily supplied via the basilar. Left PCA supplied via the basilar as well as a robust left posterior communicating artery. PCAs perfused to their distal aspects without definite high-grade stenosis. No visible aneurysm on this motion degraded exam. Anatomic variants: Diminutive right vertebral artery largely terminates in PICA. Prominent left posterior communicating artery. MRA NECK FINDINGS Aortic arch: Examination moderately degraded by motion. Visualized aortic arch normal caliber with normal branch pattern. No hemodynamically significant stenosis seen about the origin of the great vessels. Right carotid system: Right CCA patent from its origin to the bifurcation without stenosis. Atheromatous irregularity seen about the right carotid bulb/proximal right ICA with no more than mild 20% stenosis by NASCET criteria. Right ICA patent distally without stenosis, evidence for dissection, or occlusion. Left carotid system: Left CCA patent from its origin to the bifurcation without stenosis. Atheromatous irregularity with up to 70% short-segment stenosis at the origin of the left ICA (series 1060, image 3). Left ICA patent distally without additional stenosis, evidence for dissection or occlusion. Vertebral arteries: Both vertebral arteries arise from subclavian arteries. Vertebral arteries are patent without hemodynamically significant stenosis. No evidence for dissection or occlusion. Other: None IMPRESSION: MRI HEAD: 1. 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 Given the various vascular distributions involved, a central thromboembolic etiology is suspected. 2.  Underlying moderate chronic microvascular ischemic disease with a few scattered remote lacunar infarcts involving the left thalamus and pons. MRA HEAD: 1. Technically limited exam due to motion artifact. 2. Negative intracranial MRA for large vessel occlusion. Intracranial atherosclerotic disease with associated moderate stenoses involving the distal left V4 segment, distal basilar artery, and cavernous right ICA as above. 3. 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 MRA NECK: 1. Technically limited exam due to motion artifact. 2. Approximate 70% atheromatous stenosis at the origin of the left ICA. 3. Otherwise negative MRA of the neck. No other hemodynamically significant or correctable stenosis identified. Electronically Signed   By: Jeannine Boga M.D.   On: 09/28/2020 03:18   CARDIAC CATHETERIZATION  Result Date: 09/22/2020  RPDA lesion is 50% stenosed.  Dist Cx lesion is 30% stenosed with 30% stenosed side branch in LPAV.  1st Diag lesion is 60% stenosed.  Mid LAD lesion is 100% stenosed.  Post intervention, there is a 0% residual stenosis.  A drug-eluting stent was successfully  placed using a STENT ONYX FRONTIER 2.5X38.  LV end diastolic pressure is mildly elevated.  1. 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 Mildly elevated LVEDP 21 mmHg 3. Successful PCI of the mid LAD with DES x 1 Plan: DAPT for one year. I suspect she will have significant myocardial injury given late presentation. Will check Echo. Aggressive risk factor modification.   DG Chest Portable 1 View  Result Date: 09/27/2020 CLINICAL DATA:  Code STEMI.  Shortness of breath and recent MI. EXAM: PORTABLE CHEST 1 VIEW COMPARISON:  09/23/2020 FINDINGS: The heart size and mediastinal contours are within normal limits. Both lungs are clear. The visualized skeletal structures are unremarkable. IMPRESSION: No active disease. Electronically Signed   By: Lucienne Capers M.D.   On:  09/27/2020 20:02   DG Swallowing Func-Speech Pathology  Result Date: 10/01/2020 Formatting of this result is different from the original. Objective Swallowing Evaluation: Type of Study: MBS-Modified Barium Swallow Study  Patient Details Name: Tracey Meyer MRN: [id number] Date of Birth: [birth year] Today's Date: 10/01/2020 Time: SLP Start Time (ACUTE ONLY): 1159 -SLP Stop Time (ACUTE ONLY): 1212 SLP Time Calculation (min) (ACUTE ONLY): 13 min Past Medical History: Past Medical History: Diagnosis Date  Cardiomyopathy, ischemic 09/24/2020  DM (diabetes mellitus), type 2 ([place]) 09/24/2020  HTN (hypertension)   Hyperlipidemia   Prediabetes   S/P angioplasty with stent to mLAD 09/22/20  09/24/2020  Tobacco abuse 09/24/2020  Transaminitis 09/24/2020 Past Surgical History: Past Surgical History: Procedure Laterality Date  BREAST SURGERY Left   age 71  CORONARY/GRAFT ACUTE MI REVASCULARIZATION N/A 09/22/2020  Procedure: Coronary/Graft Acute MI Revascularization;  Surgeon: Martinique, Peter M, MD;  Location: [facility];  Service: Cardiovascular;  Laterality: N/A;  LEFT HEART CATH AND CORONARY ANGIOGRAPHY N/A 09/22/2020  Procedure: LEFT HEART CATH AND CORONARY ANGIOGRAPHY;  Surgeon: Martinique, Peter M, MD;  Location: [facility];  Service: Cardiovascular;  Laterality: N/A; HPI: Pt is a 60 y.o. female who presented with L sided weakness, slurred speech, orthopnea,  and SOB. MRI brain 7/8: Multiple scattered acute to early subacute ischemic nonhemorrhagic infarcts involving the bilateral cerebral and cerebellar hemispheres as above, with largest area of infarction measuring up to 2 cm at the right occipital cortex. TPA was not given. 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 Pt passed the [facility] swallow screen on 7/7 and 7/8 SLP consulted for swallowing on 7/10 due to "some difficulty swallowing and getting SOB when eating" as noted by referring MD. PMH: DM2, tobacco use and trying to quit, HLD, CAD s/p  angio and stenting of mLAD 7/4.  Subjective: pt feels like she's coughing with PO intake, getting SOB Assessment / Plan / Recommendation CHL IP CLINICAL IMPRESSIONS 10/01/2020 Clinical Impression Pt presents with functional oropharyngeal swallowing with good timing and efficiency so that she does not have any residuals or penetration/aspiration. She did have one moment of strong coughing that occurred shortly after swallowing, and she did acknowledge that this is what has been happening during meals, but there was no clear source for this coughing based on oropharyngeal function. Recommend continuing with regular solids and thin liquids as tolerated. SLP will f/u briefly given subjective symptoms. SLP Visit Diagnosis Dysphagia, unspecified (R13.10) Attention and concentration deficit following -- Frontal lobe and executive function deficit following -- Impact on safety and function No limitations   CHL IP TREATMENT RECOMMENDATION 10/01/2020 Treatment Recommendations Therapy as outlined in treatment plan below   Prognosis  10/01/2020 Prognosis for Safe Diet Advancement Good Barriers to Reach Goals -- Barriers/Prognosis Comment -- CHL IP DIET RECOMMENDATION 10/01/2020 SLP Diet Recommendations Regular solids;Thin liquid Liquid Administration via Cup;Straw Medication Administration Whole meds with liquid Compensations Slow rate;Small sips/bites Postural Changes Seated upright at 90 degrees   CHL IP OTHER RECOMMENDATIONS 10/01/2020 Recommended Consults -- Oral Care Recommendations Oral care BID Other Recommendations --   CHL IP FOLLOW UP RECOMMENDATIONS 10/01/2020 Follow up Recommendations Inpatient Rehab   CHL IP FREQUENCY AND DURATION 10/01/2020 Speech Therapy Frequency (ACUTE ONLY) min 1 x/week Treatment Duration 1 week      CHL IP ORAL PHASE 10/01/2020 Oral Phase WFL Oral - Pudding Teaspoon -- Oral - Pudding Cup -- Oral - Honey Teaspoon -- Oral - Honey Cup -- Oral - Nectar Teaspoon -- Oral - Nectar Cup -- Oral - Nectar Straw  -- Oral - Thin Teaspoon -- Oral - Thin Cup -- Oral - Thin Straw -- Oral - Puree -- Oral - Mech Soft -- Oral - Regular -- Oral - Multi-Consistency -- Oral - Pill -- Oral Phase - Comment --  CHL IP PHARYNGEAL PHASE 10/01/2020 Pharyngeal Phase WFL Pharyngeal- Pudding Teaspoon -- Pharyngeal -- Pharyngeal- Pudding Cup -- Pharyngeal -- Pharyngeal- Honey Teaspoon -- Pharyngeal -- Pharyngeal- Honey Cup -- Pharyngeal -- Pharyngeal- Nectar Teaspoon -- Pharyngeal -- Pharyngeal- Nectar Cup -- Pharyngeal -- Pharyngeal- Nectar Straw -- Pharyngeal -- Pharyngeal- Thin Teaspoon -- Pharyngeal -- Pharyngeal- Thin Cup -- Pharyngeal -- Pharyngeal- Thin Straw -- Pharyngeal -- Pharyngeal- Puree -- Pharyngeal -- Pharyngeal- Mechanical Soft -- Pharyngeal -- Pharyngeal- Regular -- Pharyngeal -- Pharyngeal- Multi-consistency -- Pharyngeal -- Pharyngeal- Pill -- Pharyngeal -- Pharyngeal Comment --  CHL IP CERVICAL ESOPHAGEAL PHASE 10/01/2020 Cervical Esophageal Phase WFL Pudding Teaspoon -- Pudding Cup -- Honey Teaspoon -- Honey Cup -- Nectar Teaspoon -- Nectar Cup -- Nectar Straw -- Thin Teaspoon -- Thin Cup -- Thin Straw -- Puree -- Mechanical Soft -- Regular -- Multi-consistency -- Pill -- Cervical Esophageal Comment -- Osie Bond., M.A. CCC-SLP Acute Rehabilitation Services Pager [phone] Office [phone] 10/01/2020, 1:53 PM              ECHOCARDIOGRAM COMPLETE  Result Date: 09/28/2020    ECHOCARDIOGRAM REPORT   Patient Name:   Tracey Meyer Date of Exam: 09/28/2020 Medical Rec #:  [id number]          Height:       64.0 in Accession #:    [id number]         Weight:       175.0 lb Date of Birth:  Nov 22, 1960          BSA:          1.848 m Patient Age:    69 years           BP:           139/78 mmHg Patient Gender: F                  HR:           62 bpm. Exam Location:  Inpatient Procedure: 2D Echo, Cardiac Doppler and Color Doppler Indications:    CHF  History:        Patient has prior history of Echocardiogram examinations, most                  recent 09/22/2020. Risk Factors:Diabetes, Dyslipidemia and Current  Smoker.  Sonographer:    Cammy Brochure Referring Phys: [place]  1. Left ventricular ejection fraction, by estimation, is 30 to 35%. The left ventricle has moderately decreased function. The left ventricle demonstrates regional wall motion abnormalities (see scoring diagram/findings for description). There is mild left ventricular hypertrophy. Left ventricular diastolic parameters are consistent with Grade II diastolic dysfunction (pseudonormalization). Elevated left atrial pressure.  2. No LV thrombus seen  3. Right ventricular systolic function is normal. The right ventricular size is normal. Tricuspid regurgitation signal is inadequate for assessing PA pressure.  4. The mitral valve is normal in structure. Trivial mitral valve regurgitation.  5. The aortic valve was not well visualized. Aortic valve regurgitation is not visualized. No aortic stenosis is present.  6. The inferior vena cava is normal in size with greater than 50% respiratory variability, suggesting right atrial pressure of 3 mmHg. FINDINGS  Left Ventricle: Left ventricular ejection fraction, by estimation, is 30 to 35%. The left ventricle has moderately decreased function. The left ventricle demonstrates regional wall motion abnormalities. The left ventricular internal cavity size was normal in size. There is mild left ventricular hypertrophy. Left ventricular diastolic parameters are consistent with Grade II diastolic dysfunction (pseudonormalization). Elevated left atrial pressure.  LV Wall Scoring: The mid and distal anterior wall, mid and distal anterior septum, mid inferoseptal segment, and apex are akinetic. The apical inferior segment is hypokinetic. The entire lateral wall, inferior wall, basal anteroseptal segment, basal anterior segment, and basal inferoseptal segment are normal. Right Ventricle: The right ventricular  size is normal. No increase in right ventricular wall thickness. Right ventricular systolic function is normal. Tricuspid regurgitation signal is inadequate for assessing PA pressure. Left Atrium: Left atrial size was normal in size. Right Atrium: Right atrial size was normal in size. Pericardium: There is no evidence of pericardial effusion. Mitral Valve: The mitral valve is normal in structure. Trivial mitral valve regurgitation. Tricuspid Valve: The tricuspid valve is normal in structure. Tricuspid valve regurgitation is trivial. Aortic Valve: The aortic valve was not well visualized. Aortic valve regurgitation is not visualized. No aortic stenosis is present. Aortic valve mean gradient measures 3.0 mmHg. Aortic valve peak gradient measures 5.7 mmHg. Aortic valve area, by VTI measures 1.73 cm. Pulmonic Valve: The pulmonic valve was not well visualized. Pulmonic valve regurgitation is not visualized. Aorta: The aortic root and ascending aorta are structurally normal, with no evidence of dilitation. Venous: The inferior vena cava is normal in size with greater than 50% respiratory variability, suggesting right atrial pressure of 3 mmHg. IAS/Shunts: The interatrial septum was not well visualized.  LEFT VENTRICLE PLAX 2D LVIDd:         5.10 cm  Diastology LVIDs:         3.80 cm  LV e' medial:    4.46 cm/s LV PW:         1.20 cm  LV E/e' medial:  19.5 LV IVS:        1.10 cm  LV e' lateral:   3.59 cm/s LVOT diam:     1.80 cm  LV E/e' lateral: 24.2 LV SV:         41 LV SV Index:   22 LVOT Area:     2.54 cm  RIGHT VENTRICLE RV Basal diam:  3.40 cm RV S prime:     7.94 cm/s LEFT ATRIUM             Index  RIGHT ATRIUM           Index LA diam:        3.70 cm 2.00 cm/m  RA Area:     10.50 cm LA Vol (A2C):   37.1 ml 20.07 ml/m RA Volume:   24.00 ml  12.98 ml/m LA Vol (A4C):   48.9 ml 26.45 ml/m LA Biplane Vol: 43.4 ml 23.48 ml/m  AORTIC VALVE AV Area (Vmax):    1.65 cm AV Area (Vmean):   1.59 cm AV Area (VTI):      1.73 cm AV Vmax:           119.00 cm/s AV Vmean:          75.400 cm/s AV VTI:            0.235 m AV Peak Grad:      5.7 mmHg AV Mean Grad:      3.0 mmHg LVOT Vmax:         77.00 cm/s LVOT Vmean:        47.200 cm/s LVOT VTI:          0.160 m LVOT/AV VTI ratio: 0.68  AORTA Ao Root diam: 2.60 cm Ao Asc diam:  3.30 cm MITRAL VALVE               TRICUSPID VALVE MV Area (PHT): 4.06 cm    TR Peak grad:   19.7 mmHg MV Decel Time: 187 msec    TR Vmax:        222.00 cm/s MV E velocity: 87.00 cm/s MV A velocity: 60.80 cm/s  SHUNTS MV E/A ratio:  1.43        Systemic VTI:  0.16 m                            Systemic Diam: 1.80 cm Oswaldo Milian MD Electronically signed by Oswaldo Milian MD Signature Date/Time: 09/28/2020/4:34:28 PM    Final    ECHOCARDIOGRAM COMPLETE  Result Date: 09/22/2020    ECHOCARDIOGRAM REPORT   Patient Name:   TANYIA GRABBE Date of Exam: 09/22/2020 Medical Rec #:  [id number]          Height: Accession #:    [id number]         Weight: Date of Birth:  03/30/1960          BSA: Patient Age:    36 years           BP:           180/99 mmHg Patient Gender: F                  HR:           83 bpm. Exam Location:  Inpatient Procedure: 2D Echo, Cardiac Doppler and Color Doppler Indications:    acute MI  History:        Patient has no prior history of Echocardiogram examinations.                 Acute MI, Signs/Symptoms:Chest Pain; Risk Factors:Hypertension,                 Dyslipidemia, Diabetes and Current Smoker.  Sonographer:    Dustin Flock Referring Phys: [id number] PETER M Martinique IMPRESSIONS  1. Apex well-visualized without contrast - false tendon (normal variant) - no thrombus. Left ventricular ejection fraction, by estimation, is 35 to 40%. The left ventricle has moderately decreased function. The  left ventricle demonstrates regional wall motion abnormalities (see scoring diagram/findings for description). There is mild left ventricular hypertrophy. Left ventricular diastolic parameters  are consistent with Grade I diastolic dysfunction (impaired relaxation). Elevated left ventricular end-diastolic pressure. There is severe hypokinesis of the left ventricular, entire anterior wall, anteroseptal wall, apical segment and inferoapical segment. Findings suggest LAD territory ischemia/infarct.  2. Right ventricular systolic function is normal. The right ventricular size is normal. There is normal pulmonary artery systolic pressure.  3. The mitral valve is abnormal. Mild mitral valve regurgitation.  4. The aortic valve is tricuspid. Aortic valve regurgitation is not visualized.  5. The inferior vena cava is normal in size with <50% respiratory variability, suggesting right atrial pressure of 8 mmHg. Comparison(s): No prior Echocardiogram. FINDINGS  Left Ventricle: Apex well-visualized without contrast - false tendon (normal variant) - no thrombus. Left ventricular ejection fraction, by estimation, is 35 to 40%. The left ventricle has moderately decreased function. The left ventricle demonstrates regional wall motion abnormalities. Severe hypokinesis of the left ventricular, entire anterior wall, anteroseptal wall, apical segment and inferoapical segment. The left ventricular internal cavity size was normal in size. There is mild left ventricular  hypertrophy. Left ventricular diastolic parameters are consistent with Grade I diastolic dysfunction (impaired relaxation). Elevated left ventricular end-diastolic pressure.  LV Wall Scoring: The mid and distal anterior wall, mid and distal anterior septum, and entire apex are hypokinetic. The antero-lateral wall, inferior wall, posterior wall, basal anteroseptal segment, mid inferoseptal segment, basal anterior segment, and basal inferoseptal segment are normal. Right Ventricle: The right ventricular size is normal. No increase in right ventricular wall thickness. Right ventricular systolic function is normal. There is normal pulmonary artery systolic pressure.  The tricuspid regurgitant velocity is 2.58 m/s, and  with an assumed right atrial pressure of 8 mmHg, the estimated right ventricular systolic pressure is 35.3 mmHg. Left Atrium: Left atrial size was normal in size. Right Atrium: Right atrial size was normal in size. Pericardium: There is no evidence of pericardial effusion. Mitral Valve: The mitral valve is abnormal. There is mild thickening of the mitral valve leaflet(s). Mild mitral valve regurgitation. Tricuspid Valve: The tricuspid valve is grossly normal. Tricuspid valve regurgitation is trivial. Aortic Valve: The aortic valve is tricuspid. Aortic valve regurgitation is not visualized. Pulmonic Valve: The pulmonic valve was grossly normal. Pulmonic valve regurgitation is not visualized. Aorta: The aortic root and ascending aorta are structurally normal, with no evidence of dilitation. Venous: The inferior vena cava is normal in size with less than 50% respiratory variability, suggesting right atrial pressure of 8 mmHg. IAS/Shunts: No atrial level shunt detected by color flow Doppler.  LEFT VENTRICLE PLAX 2D LVIDd:         4.90 cm      Diastology LVIDs:         4.40 cm      LV e' medial:    4.13 cm/s LV PW:         1.20 cm      LV E/e' medial:  16.2 LV IVS:        1.00 cm      LV e' lateral:   3.48 cm/s LVOT diam:     2.10 cm      LV E/e' lateral: 19.3 LV SV:         49 LVOT Area:     3.46 cm  LV Volumes (MOD) LV vol d, MOD A4C: 131.0 ml LV vol s, MOD A4C: 75.5 ml LV SV  MOD A4C:     131.0 ml RIGHT VENTRICLE RV Basal diam:  2.20 cm RV S prime:     9.57 cm/s TAPSE (M-mode): 1.8 cm LEFT ATRIUM             RIGHT ATRIUM LA diam:        3.80 cm RA Area:     10.10 cm LA Vol (A2C):   28.4 ml RA Volume:   19.60 ml LA Vol (A4C):   34.6 ml LA Biplane Vol: 31.7 ml  AORTIC VALVE LVOT Vmax:   83.50 cm/s LVOT Vmean:  54.400 cm/s LVOT VTI:    0.141 m  AORTA Ao Root diam: 2.70 cm MITRAL VALVE                TRICUSPID VALVE MV Area (PHT): 6.65 cm     TR Peak grad:   26.6 mmHg  MV Decel Time: 114 msec     TR Vmax:        258.00 cm/s MV E velocity: 67.00 cm/s MV A velocity: 100.00 cm/s  SHUNTS MV E/A ratio:  0.67         Systemic VTI:  0.14 m                             Systemic Diam: 2.10 cm Lyman Bishop MD Electronically signed by Lyman Bishop MD Signature Date/Time: 09/22/2020/1:04:05 PM    Final    ABORTED INVASIVE LAB PROCEDURE  Result Date: 09/28/2020 This case was aborted.    PHYSICAL EXAM Physical Exam  Constitutional: Appears well-developed and well-nourished.  Psych: Affect appropriate to situation Eyes: Normal external eye and conjunctiva. Cardiovascular: Normal rate and regular rhythm.   Neurological Examination On exam, patient awake alert, orientated x3, no aphasia, follows simple commands, able to name and repeat.  No gaze palsy, visual field full.  Mild left nasolabial fold flattening.  Left upper and lower extremity flaccid 1/5.  Right upper and lower extremity 5/5.  Sensation symmetrical bilaterally.  Right finger-to-nose intact.  ASSESSMENT/PLAN Tracey Meyer is a 60 y.o. female with PMH significant for DM2, tobacco use and trying to quit, HLD, CAD s/p angio and stenting of mLAD a few days ago who presents with L sided weakness that started on 09/24/20 after discharge. She has also been having shortness of breath and it gets worse with lying flat.  Stroke:  scattered infarct likely cardioembolic secondary to cardiomyopathy with low EF CT head :No acute abnormality. 8 x 16 x 17 mm focus of peripheral hypo attenuation in the posterior right parietooccipital region.  MRI  1. 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 Given the various vascular distributions involved, a central thromboembolic etiology is suspected. 2. Underlying moderate chronic microvascular ischemic  disease with a few scattered remote lacunar infarcts involving the left thalamus and pons. MRA Intracranial atherosclerotic disease with associated moderate stenoses involving the distal left V4 segment, distal basilar artery, and cavernous right ICA as above.Occlusion of the right V4 segment beyond the takeoff of the right PICA.  MRA NECK: Approximate 70% atheromatous stenosis at the origin of the left ICA. 2D Echo: 30-35% down from 35-40% on 09/22/20 TEE tomorrow, if unrevealing recommend 30 day cardiac event monitoring to rule out afib LDL 130 HgbA1c 6.8 VTE prophylaxis - Lovenox aspirin  81 mg daily and Brilinta (ticagrelor) 90 mg bid prior to admission, now on aspirin 81 mg daily and Brilinta (ticagrelor) 90 mg bid.  Continue ASA and Brillinta per cardiology recommendations. Therapy recommendations: SNF Disposition:  pending  Recent STEMI s/p stent anterior STEMI 09/21/20 s/p LHC with PCI/DES to LAD.  TTE EF 35-40% with anterior hypokinesis but no apical thrombus at that time. This admission EF 30 to 35% On Aspirin and brilinta and statin Cardiology on board  Cardiomyopathy  Cardiology on board EF 35-40% on 09/22/20 Current TTE repeat EF 30-35% plan for TEE tomorrow On coreg, entresto, spironolactone at home  Left ICA stenosis MRA neck showed Left ICA 70% stenosis  likely asymptomatic at this time CT head and neck pending Vascular surgery Dr. Carlis Abbott consulted, will need outpt follow-up  Hypertension Home meds:  coreg, entresto, spironolactone Stable Gradually towards goal in 3-5 days Given significance in posterior circulation vascular stenosis, recommend long-term BP goal 130-150.    Hyperlipidemia Home meds:  crestor 10mg , not resumed in hospital LDL 130, goal < 70 Increase crestor to 40 mg Continue statin at discharge  Diabetes type II Controlled Home meds:  metformin HgbA1c 6.8, goal < 7.0 CBGs SSI Close PCP follow up  Tobacco abuse Current heavy smoker Last  cigarette was last Friday.  Smoking cessation counseling provided Pt is willing to quit  Other Stroke Risk Factors ETOH use, alcohol level <10, advised to drink no more than 1 drink a day  Hospital day # 4   Rosalin Hawking, MD PhD Stroke Neurology 10/01/2020 5:54 PM   To contact Stroke Continuity provider, please refer to [email or web address] After hours, contact General Neurology

## 2020-10-02 ENCOUNTER — Inpatient Hospital Stay (HOSPITAL_COMMUNITY): Payer: Medicaid Other | Admitting: Anesthesiology

## 2020-10-02 ENCOUNTER — Encounter (HOSPITAL_COMMUNITY)
Admission: EM | Disposition: A | Discharge status: Rehabilitation Facility | Payer: Self-pay | Source: Home / Self Care | Attending: Family Medicine

## 2020-10-02 ENCOUNTER — Inpatient Hospital Stay (HOSPITAL_COMMUNITY): Payer: Medicaid Other

## 2020-10-02 ENCOUNTER — Encounter (HOSPITAL_COMMUNITY): Payer: Self-pay | Admitting: Internal Medicine

## 2020-10-02 DIAGNOSIS — I340 Nonrheumatic mitral (valve) insufficiency: Secondary | ICD-10-CM

## 2020-10-02 DIAGNOSIS — I639 Cerebral infarction, unspecified: Secondary | ICD-10-CM

## 2020-10-02 DIAGNOSIS — I313 Pericardial effusion (noninflammatory): Secondary | ICD-10-CM

## 2020-10-02 HISTORY — PX: BUBBLE STUDY: SHX6837

## 2020-10-02 HISTORY — PX: TEE WITHOUT CARDIOVERSION: SHX5443

## 2020-10-02 LAB — GLUCOSE, CAPILLARY
Glucose-Capillary: 104 mg/dL — ABNORMAL HIGH (ref 70–99)
Glucose-Capillary: 101 mg/dL — ABNORMAL HIGH (ref 70–99)
Glucose-Capillary: 108 mg/dL — ABNORMAL HIGH (ref 70–99)

## 2020-10-02 SURGERY — ECHOCARDIOGRAM, TRANSESOPHAGEAL
Anesthesia: General

## 2020-10-02 MED ORDER — FAMOTIDINE 20 MG PO TABS
20.0000 mg | ORAL_TABLET | Freq: Every day | ORAL | Status: DC
  Filled 2020-10-02: qty 1

## 2020-10-02 MED ORDER — STROKE: EARLY STAGES OF RECOVERY BOOK
Freq: Once | Status: AC
  Administered 2020-10-02: 1
  Filled 2020-10-02 (×2): qty 1

## 2020-10-02 MED ORDER — TICAGRELOR 90 MG PO TABS
90.0000 mg | ORAL_TABLET | Freq: Two times a day (BID) | ORAL | Status: DC
  Administered 2020-10-02 – 2020-10-09 (×15): 90 mg via ORAL
  Filled 2020-10-02 (×15): qty 1

## 2020-10-02 MED ORDER — PROPOFOL 500 MG/50ML IV EMUL
INTRAVENOUS | Status: DC | PRN
  Administered 2020-10-02: 125 ug/kg/min via INTRAVENOUS

## 2020-10-02 MED ORDER — ACETAMINOPHEN 160 MG/5ML PO SOLN: 650.0000 mg | ORAL | Status: DC | PRN

## 2020-10-02 MED ORDER — ROSUVASTATIN CALCIUM 20 MG PO TABS
40.0000 mg | ORAL_TABLET | Freq: Every day | ORAL | Status: DC
  Administered 2020-10-02 – 2020-10-09 (×8): 40 mg via ORAL
  Filled 2020-10-02 (×8): qty 2

## 2020-10-02 MED ORDER — PROPOFOL 10 MG/ML IV BOLUS
INTRAVENOUS | Status: DC | PRN
  Administered 2020-10-02 (×3): 20 mg via INTRAVENOUS

## 2020-10-02 MED ORDER — LIDOCAINE 2% (20 MG/ML) 5 ML SYRINGE
INTRAMUSCULAR | Status: DC | PRN
  Administered 2020-10-02: 70 mg via INTRAVENOUS

## 2020-10-02 MED ORDER — INSULIN ASPART 100 UNIT/ML IJ SOLN
0.0000 [IU] | Freq: Three times a day (TID) | INTRAMUSCULAR | Status: DC
Start: 2020-10-02 — End: 2020-10-09
  Administered 2020-10-03: 2 [IU] via SUBCUTANEOUS
  Administered 2020-10-03: 1 [IU] via SUBCUTANEOUS
  Administered 2020-10-04 – 2020-10-06 (×7): 2 [IU] via SUBCUTANEOUS
  Administered 2020-10-07: 1 [IU] via SUBCUTANEOUS
  Administered 2020-10-07: 2 [IU] via SUBCUTANEOUS
  Administered 2020-10-08 – 2020-10-09 (×4): 1 [IU] via SUBCUTANEOUS

## 2020-10-02 MED ORDER — FAMOTIDINE 20 MG PO TABS
20.0000 mg | ORAL_TABLET | Freq: Every day | ORAL | Status: DC
  Administered 2020-10-02 – 2020-10-09 (×8): 20 mg via ORAL
  Filled 2020-10-02 (×7): qty 1

## 2020-10-02 MED ORDER — PERFLUTREN LIPID MICROSPHERE
1.0000 mL | INTRAVENOUS | Status: AC | PRN
  Administered 2020-10-02: 2 mL via INTRAVENOUS
  Filled 2020-10-02: qty 10

## 2020-10-02 MED ORDER — ALUM & MAG HYDROXIDE-SIMETH 200-200-20 MG/5ML PO SUSP: 30.0000 mL | Freq: Four times a day (QID) | ORAL | Status: DC | PRN

## 2020-10-02 MED ORDER — METOPROLOL SUCCINATE ER 25 MG PO TB24
25.0000 mg | ORAL_TABLET | Freq: Every day | ORAL | Status: DC
  Administered 2020-10-02 – 2020-10-09 (×8): 25 mg via ORAL
  Filled 2020-10-02 (×8): qty 1

## 2020-10-02 MED ORDER — INSULIN ASPART 100 UNIT/ML IJ SOLN
0.0000 [IU] | Freq: Every day | INTRAMUSCULAR | Status: DC
Start: 2020-10-02 — End: 2020-10-09

## 2020-10-02 MED ORDER — ACETAMINOPHEN 650 MG RE SUPP: 650.0000 mg | RECTAL | Status: DC | PRN

## 2020-10-02 MED ORDER — ASPIRIN EC 81 MG PO TBEC
81.0000 mg | DELAYED_RELEASE_TABLET | Freq: Every day | ORAL | Status: DC
  Administered 2020-10-03 – 2020-10-09 (×7): 81 mg via ORAL
  Filled 2020-10-02 (×7): qty 1

## 2020-10-02 MED ORDER — ENOXAPARIN SODIUM 40 MG/0.4ML IJ SOSY
40.0000 mg | PREFILLED_SYRINGE | INTRAMUSCULAR | Status: DC
  Administered 2020-10-02 – 2020-10-08 (×7): 40 mg via SUBCUTANEOUS
  Filled 2020-10-02 (×7): qty 0.4

## 2020-10-02 MED ORDER — ACETAMINOPHEN 325 MG PO TABS
650.0000 mg | ORAL_TABLET | ORAL | Status: DC | PRN
  Administered 2020-10-06 – 2020-10-07 (×2): 650 mg via ORAL
  Filled 2020-10-02 (×2): qty 2

## 2020-10-02 NOTE — Progress Notes (Signed)
PT Cancellation Note  Patient Details Name: Tracey Meyer MRN: [id number] DOB: [birth year]   Cancelled Treatment:    Reason Eval/Treat Not Completed: Patient at procedure or test/unavailable. Pt off floor for TEE. PT to re-attempt as time allows.   Lorriane Shire 10/02/2020, 7:37 AM  Lorrin Goodell, PT  Office # [phone] Pager [phone]

## 2020-10-02 NOTE — Interval H&P Note (Signed)
History and Physical Interval Note:  10/02/2020 7:27 AM  Tracey Meyer  has presented today for surgery, with the diagnosis of stroke.  The various methods of treatment have been discussed with the patient and family. After consideration of risks, benefits and other options for treatment, the patient has consented to  Procedure(s): TRANSESOPHAGEAL ECHOCARDIOGRAM (TEE) (N/A) as a surgical intervention.  The patient's history has been reviewed, patient examined, no change in status, stable for surgery.  I have reviewed the patient's chart and labs.  Questions were answered to the patient's satisfaction.     Kirk Ruths

## 2020-10-02 NOTE — Progress Notes (Addendum)
PROGRESS NOTE    Tracey Meyer   IDP:[id number]  DOB: [birth year]  DOA: 09/27/2020 PCP: Pcp, No   Brief Narrative:  Tracey Meyer is a 59 y/o F with CAD (recent stent to the LAD on 7/4), ischemic cardiomyopathy with EF of 35-40%, HTN, DM2, HLD and smoking who presented to the ED with shortness of breath and orthopnea.  She also stated that on the evening of 7/4, she developed left sided weakness and slurred speech which did not improve.   In ED: CT head> 18 x 16 x 17 mm focus of peripheral hypo attenuation in the posterior right parietooccipital region. This is nonspecific and could be related to subacute infarct, but given the somewhat rounded configuration, MRI of the brain without and with contrast recommended to further evaluate.  Cardiology and neurology consulted. IV Lasix started and MRI/ MRA ordered. Admitted to treat acute CHF and work up neurological symptoms.  Subjective: No new complaints.     Assessment & Plan:   Principal Problem:   Acute ischemic strokes- multifocal and bilateral   Hyperlipidemia -symptoms occurred on the night of 7/4 after she got home from the hospital - MRI > Multiple scattered acute to early subacute ischemic nonhemorrhagic infarcts- bilateral cerebral and cerebellar hemispheres  - ? Embolic - MRA neck showed Left ICA 70% stenosis -LDL is 130-Crestor started after MI-  increased from 10 mg this admission to 40 mg daily -A1c is 6.8 -2D echo does not reveal a thrombus   -Neurology recommending to continue 81 mg aspirin and Brilinta -Therapy recommends CIR-I have consulted them - SLP eval done- mild aspiration risk- regular diet with thin liquid recommended and MBS ordered - SLUMs is 17/30 - 7/11> - MBS completed and SLP recommending regular diet and thin liquids,   - 7/12- TEE negative for thrombus- neuro recommending 30 day event monitor - CIR has signed off today as patient has no one to assist her at home- her husband states he  is disabled and cannot help. I have consulted TOC for SNF- she will need Medicaid - around 3 pm today, per RN and per Neuro note, patient had improved strength in left side (2-3/5 from 0/5 this AM)- I have no explanation for this - will request PT/OT re-evals tomorrow  Active Problems: Left ICA stenosis - MRA>> Approximate 70% atheromatous stenosis at the origin of the left ICA- clearly not the cause of current CVAs - Spoke with vascular surgery. Dr Carlis Abbott to get an appt so she does not fall through the cracks - CTA today- no vascular stenosis- Neuro communicated with vascular  Chronic systolic heart failure (mild) CAD with NSTEMI and stent in mid LAD on 09/22/2020 -Echo 09/22/2020> EF 35 to 40%, mild LVH, grade 1 diastolic dysfunction, severe hypokinesis of the LV entire anterior wall anteroseptal wall apical segment and inferior apical segment. -Echo 09/28/2020> EF 30 to 44%, grade 2 diastolic dysfunction -Given 20 mg of IV Lasix on 7/7 and no diuretics since - Continue aspirin, Brilinta, Crestor -Antihypertensives being held to allow for permissive hypertension- BP 155/70 and then 128/79 today -Cardiology is following  Urinary retention - 7/8 Patient had the urge to urinate but was unable to-bladder scan revealed 700 cc- In-N-Out cath done and 600 cc of urine obtained  - no further I and O caths needed  Small amounts of pink discharge in bed pan - follow- much less today per RN  CKD 3B - Cr has mostly been ~ 1.0-1.2  in hospital -  Cr was 0.80 on 7/2- I have no labs prior to this      DM (diabetes mellitus), type 2 ([place]) -On metformin at home- sugars < 150 here in the hospital - she is eating most of her meals-   -Hemoglobin A1C    Component Value Date/Time   HGBA1C 6.8 (H) 09/28/2020 0403   Tobacco abuse - Smoking cessation counseling given  Time spent in minutes: 35 DVT prophylaxis: enoxaparin (LOVENOX) injection 40 mg Start: 10/02/20 2200  Code Status: full code Family  Communication:  Level of Care: Level of care: Telemetry Cardiac Disposition Plan:  Status is: Inpatient  Remains inpatient appropriate because:Inpatient level of care appropriate due to severity of illness  Dispo: The patient is from: Home              Anticipated d/c is to: SNF              Patient currently is medically stable to d/c.   Difficult to place patient Yes      Consultants:  Cardiology Neurology  Procedures:  none Antimicrobials:  Anti-infectives (From admission, onward)    None        Objective: Vitals:   10/02/20 0834 10/02/20 0846 10/02/20 0905 10/02/20 1402  BP: (!) 124/55 (!) 147/70  (!) 114/95  Pulse: 73 75  79  Resp: (!) 24 17  17   Temp: 98.8 F (37.1 C)  (!) 97.5 F (36.4 C) 97.6 F (36.4 C)  TempSrc: Axillary  Oral Oral  SpO2: 100% 99%  96%  Weight:      Height:        Intake/Output Summary (Last 24 hours) at 10/02/2020 1720 Last data filed at 10/02/2020 1100 Gross per 24 hour  Intake 118 ml  Output 275 ml  Net -157 ml    Filed Weights   09/30/20 0438 10/01/20 0500 10/02/20 0326  Weight: 69.5 kg 69 kg 70.1 kg    Examination: General exam: Appears comfortable  HEENT: PERRLA, oral mucosa moist, no sclera icterus or thrush Respiratory system: Clear to auscultation. Respiratory effort normal. Cardiovascular system: S1 & S2 heard, regular rate and rhythm Gastrointestinal system: Abdomen soft, non-tender, nondistended. Normal bowel sounds   Central nervous system: Alert and oriented. 0/5 strength on left side on my exam today Extremities: No cyanosis, clubbing or edema Skin: No rashes or ulcers Psychiatry:  flat affect    Data Reviewed: I have personally reviewed following labs and imaging studies  CBC: Recent Labs  Lab 09/27/20 1900 09/27/20 1950  WBC 6.9  --   NEUTROABS 4.9  --   HGB 12.1 12.2  HCT 36.4 36.0  MCV 94.3  --   PLT 352  --     Basic Metabolic Panel: Recent Labs  Lab 09/27/20 1900 09/27/20 1950  09/29/20 0912  NA 134* 138 135  K 3.8 3.8 4.1  CL 104 108 104  CO2 18*  --  22  GLUCOSE 205* 201* 145*  BUN 26* 26* 24*  CREATININE 1.30* 1.20* 1.10*  CALCIUM 9.7  --  9.6    GFR: Estimated Creatinine Clearance: 52.9 mL/min (A) (by C-G formula based on SCr of 1.1 mg/dL (H)). Liver Function Tests: Recent Labs  Lab 09/27/20 1900  AST 28  ALT 26  ALKPHOS 47  BILITOT 0.5  PROT 6.8  ALBUMIN 3.0*    No results for input(s): LIPASE, AMYLASE in the last 168 hours. No results for input(s): AMMONIA in the last 168 hours. Coagulation Profile: Recent  Labs  Lab 09/27/20 1900  INR 1.0    Cardiac Enzymes: No results for input(s): CKTOTAL, CKMB, CKMBINDEX, TROPONINI in the last 168 hours. BNP (last 3 results) No results for input(s): PROBNP in the last 8760 hours. HbA1C: No results for input(s): HGBA1C in the last 72 hours.  CBG: Recent Labs  Lab 10/01/20 1648 10/01/20 2151 10/02/20 0728 10/02/20 1143 10/02/20 1601  GLUCAP 128* 133* 104* 101* 108*    Lipid Profile: No results for input(s): CHOL, HDL, LDLCALC, TRIG, CHOLHDL, LDLDIRECT in the last 72 hours.  Thyroid Function Tests: No results for input(s): TSH, T4TOTAL, FREET4, T3FREE, THYROIDAB in the last 72 hours. Anemia Panel: No results for input(s): VITAMINB12, FOLATE, FERRITIN, TIBC, IRON, RETICCTPCT in the last 72 hours. Urine analysis:    Component Value Date/Time   COLORURINE YELLOW 09/28/2020 1528   APPEARANCEUR CLEAR 09/28/2020 1528   LABSPEC 1.020 09/28/2020 1528   PHURINE 5.5 09/28/2020 1528   GLUCOSEU NEGATIVE 09/28/2020 1528   HGBUR NEGATIVE 09/28/2020 [place] 09/28/2020 1528   [place] 09/28/2020 1528   PROTEINUR NEGATIVE 09/28/2020 1528   NITRITE POSITIVE (A) 09/28/2020 1528   LEUKOCYTESUR NEGATIVE 09/28/2020 1528   Sepsis Labs: @LABRCNTIP (procalcitonin:4,lacticidven:4) ) Recent Results (from the past 240 hour(s))  Resp Panel by RT-PCR (Flu A&B, Covid)  Nasopharyngeal Swab     Status: None   Collection Time: 09/27/20  6:54 PM   Specimen: Nasopharyngeal Swab; Nasopharyngeal(NP) swabs in vial transport medium  Result Value Ref Range Status   SARS Coronavirus 2 by RT PCR NEGATIVE NEGATIVE Final    Comment: (NOTE) SARS-CoV-2 target nucleic acids are NOT DETECTED.  The SARS-CoV-2 RNA is generally detectable in upper respiratory specimens during the acute phase of infection. The lowest concentration of SARS-CoV-2 viral copies this assay can detect is 138 copies/mL. A negative result does not preclude SARS-Cov-2 infection and should not be used as the sole basis for treatment or other patient management decisions. A negative result may occur with  improper specimen collection/handling, submission of specimen other than nasopharyngeal swab, presence of viral mutation(s) within the areas targeted by this assay, and inadequate number of viral copies(<138 copies/mL). A negative result must be combined with clinical observations, patient history, and epidemiological information. The expected result is Negative.  Fact Sheet for Patients:  EntrepreneurPulse.com.au  Fact Sheet for Healthcare Providers:  IncredibleEmployment.be  This test is no t yet approved or cleared by the [place] FDA and  has been authorized for detection and/or diagnosis of SARS-CoV-2 by FDA under an Emergency Use Authorization (EUA). This EUA will remain  in effect (meaning this test can be used) for the duration of the COVID-19 declaration under Section 564(b)(1) of the Act, 21 U.S.C.section 360bbb-3(b)(1), unless the authorization is terminated  or revoked sooner.       Influenza A by PCR NEGATIVE NEGATIVE Final   Influenza B by PCR NEGATIVE NEGATIVE Final    Comment: (NOTE) The Xpert Xpress SARS-CoV-2/FLU/RSV plus assay is intended as an aid in the diagnosis of influenza from Nasopharyngeal swab specimens and should not be  used as a sole basis for treatment. Nasal washings and aspirates are unacceptable for Xpert Xpress SARS-CoV-2/FLU/RSV testing.  Fact Sheet for Patients: EntrepreneurPulse.com.au  Fact Sheet for Healthcare Providers: IncredibleEmployment.be  This test is not yet approved or cleared by the [place] FDA and has been authorized for detection and/or diagnosis of SARS-CoV-2 by FDA under an Emergency Use Authorization (EUA). This EUA will remain in effect (meaning  this test can be used) for the duration of the COVID-19 declaration under Section 564(b)(1) of the Act, 21 U.S.C. section 360bbb-3(b)(1), unless the authorization is terminated or revoked.  Performed at [facility], [place] [address] [place],  [address]           Radiology Studies: CT ANGIO HEAD NECK W WO CM  Result Date: 10/01/2020 CLINICAL DATA:  Follow-up stroke. Left-sided weakness. 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 EXAM: CT ANGIOGRAPHY HEAD AND NECK TECHNIQUE: Multidetector CT imaging of the head and neck was performed using the standard protocol during bolus administration of intravenous contrast. Multiplanar CT image reconstructions and MIPs were obtained to evaluate the vascular anatomy. Carotid stenosis measurements (when applicable) are obtained utilizing NASCET criteria, using the distal internal carotid diameter as the denominator. CONTRAST:  25mL OMNIPAQUE IOHEXOL 350 MG/ML SOLN COMPARISON:  MRI studies 09/28/2020 FINDINGS: CT HEAD FINDINGS Brain: Acute infarction at the pontomedullary junction shown by MRI is difficult to appreciate by CT. No focal cerebellar finding. Low-density in the right occipital lobe consistent with the 2 cm acute infarction in that region. Other punctate acute infarctions scattered within both hemispheres cannot be specifically appreciated by CT. Chronic small-vessel  ischemic changes are present throughout the cerebral hemispheric white matter. No hemorrhage, hydrocephalus or extra-axial collection. Vascular: There is atherosclerotic calcification of the major vessels at the base of the brain. Skull: Negative Sinuses: Clear/normal Orbits: Normal Review of the MIP images confirms the above findings CTA NECK FINDINGS Aortic arch: No aortic atherosclerotic calcification is seen. No dissection. Branching pattern is normal without origin stenosis. Right carotid system: Common carotid artery widely patent to the bifurcation. There is calcified plaque at the carotid bifurcation but without stenosis. Left carotid system: Common carotid artery widely patent to the bifurcation. There is calcified plaque at the carotid bifurcation and ICA bulb but no stenosis. Vertebral arteries: Both vertebral artery origins are widely patent. Both vertebral arteries appear normal through the cervical region to the foramen magnum. Skeleton: Minimal cervical spondylosis. Other neck: No mass or lymphadenopathy. Upper chest: Normal Review of the MIP images confirms the above findings CTA HEAD FINDINGS Anterior circulation: Both internal carotid arteries are patent through the skull base and siphon regions. The anterior and middle cerebral vessels are patent. No large vessel occlusion or correctable proximal stenosis. No aneurysm or vascular malformation. Posterior circulation: Left vertebral artery is dominant and is widely patent through the foramen magnum. Mild narrowing in the distal V4 segment, but the vessel is patent to the basilar. Right vertebral artery either terminates in PICA or is occluded beyond PICA. Proximal basilar artery shows mild atherosclerotic irregularity. Superior cerebellar and posterior cerebral arteries are patent. Venous sinuses: Patent and normal. Anatomic variants: None significant otherwise. Review of the MIP images confirms the above findings IMPRESSION: The aorta has a normal  appearance. Mild atherosclerotic plaque at both carotid bifurcations but without stenosis on either side. No evidence of ulceration or pronounced irregularity. Mild atherosclerotic change at the carotid siphons but without stenosis. No intracranial large or medium vessel occlusion in the anterior circulation. Right vertebral artery either terminates in PICA or is occluded distal to PICA. Left vertebral artery shows a moderate stenosis of the distal V4 segment but does remain patent to the basilar. Mild atherosclerotic irregularity of the proximal basilar artery. Electronically Signed   By: Nelson Chimes M.D.   On: 10/01/2020 19:34   DG Swallowing Func-Speech Pathology  Result Date: 10/01/2020 Formatting of this result is different from the original. Objective Swallowing Evaluation: Type of Study: MBS-Modified Barium Swallow Study  Patient Details Name: Tracey Meyer MRN: [id number] Date of Birth: 04-Mar-1961 Today's Date: 10/01/2020 Time: SLP Start Time (ACUTE ONLY): 1159 -SLP Stop Time (ACUTE ONLY): 1212 SLP Time Calculation (min) (ACUTE ONLY): 13 min Past Medical History: Past Medical History: Diagnosis Date  Cardiomyopathy, ischemic 09/24/2020  DM (diabetes mellitus), type 2 ([place]) 09/24/2020  HTN (hypertension)   Hyperlipidemia   Prediabetes   S/P angioplasty with stent to mLAD 09/22/20  09/24/2020  Tobacco abuse 09/24/2020  Transaminitis 09/24/2020 Past Surgical History: Past Surgical History: Procedure Laterality Date  BREAST SURGERY Left   age 14  CORONARY/GRAFT ACUTE MI REVASCULARIZATION N/A 09/22/2020  Procedure: Coronary/Graft Acute MI Revascularization;  Surgeon: Martinique, Peter M, MD;  Location: [facility];  Service: Cardiovascular;  Laterality: N/A;  LEFT HEART CATH AND CORONARY ANGIOGRAPHY N/A 09/22/2020  Procedure: LEFT HEART CATH AND CORONARY ANGIOGRAPHY;  Surgeon: Martinique, Peter M, MD;  Location: [facility];  Service: Cardiovascular;  Laterality: N/A; HPI: Pt is a 60 y.o. female who presented with L  sided weakness, slurred speech, orthopnea,  and SOB. MRI brain 7/8: Multiple scattered acute to early subacute ischemic nonhemorrhagic infarcts involving the bilateral cerebral and cerebellar hemispheres as above, with largest area of infarction measuring up to 2 cm at the right occipital cortex. TPA was not given. 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 Pt passed the [place] swallow screen on 7/7 and 7/8 SLP consulted for swallowing on 7/10 due to "some difficulty swallowing and getting SOB when eating" as noted by referring MD. PMH: DM2, tobacco use and trying to quit, HLD, CAD s/p angio and stenting of mLAD 7/4.  Subjective: pt feels like she's coughing with PO intake, getting SOB Assessment / Plan / Recommendation CHL IP CLINICAL IMPRESSIONS 10/01/2020 Clinical Impression Pt presents with functional oropharyngeal swallowing with good timing and efficiency so that she does not have any residuals or penetration/aspiration. She did have one moment of strong coughing that occurred shortly after swallowing, and she did acknowledge that this is what has been happening during meals, but there was no clear source for this coughing based on oropharyngeal function. Recommend continuing with regular solids and thin liquids as tolerated. SLP will f/u briefly given subjective symptoms. SLP Visit Diagnosis Dysphagia, unspecified (R13.10) Attention and concentration deficit following -- Frontal lobe and executive function deficit following -- Impact on safety and function No limitations   CHL IP TREATMENT RECOMMENDATION 10/01/2020 Treatment Recommendations Therapy as outlined in treatment plan below   Prognosis 10/01/2020 Prognosis for Safe Diet Advancement Good Barriers to Reach Goals -- Barriers/Prognosis Comment -- CHL IP DIET RECOMMENDATION 10/01/2020 SLP Diet Recommendations Regular solids;Thin liquid Liquid Administration via Cup;Straw Medication Administration Whole meds with liquid Compensations  Slow rate;Small sips/bites Postural Changes Seated upright at 90 degrees   CHL IP OTHER RECOMMENDATIONS 10/01/2020 Recommended Consults -- Oral Care Recommendations Oral care BID Other Recommendations --   CHL IP FOLLOW UP RECOMMENDATIONS 10/01/2020 Follow up Recommendations Inpatient Rehab   CHL IP FREQUENCY AND DURATION 10/01/2020 Speech Therapy Frequency (ACUTE ONLY) min 1 x/week Treatment Duration 1 week      CHL IP ORAL PHASE 10/01/2020 Oral Phase WFL Oral - Pudding Teaspoon -- Oral - Pudding Cup -- Oral - Honey Teaspoon -- Oral - Honey Cup -- Oral - Nectar Teaspoon -- Oral - Nectar Cup -- Oral -  Nectar Straw -- Oral - Thin Teaspoon -- Oral - Thin Cup -- Oral - Thin Straw -- Oral - Puree -- Oral - Mech Soft -- Oral - Regular -- Oral - Multi-Consistency -- Oral - Pill -- Oral Phase - Comment --  CHL IP PHARYNGEAL PHASE 10/01/2020 Pharyngeal Phase WFL Pharyngeal- Pudding Teaspoon -- Pharyngeal -- Pharyngeal- Pudding Cup -- Pharyngeal -- Pharyngeal- Honey Teaspoon -- Pharyngeal -- Pharyngeal- Honey Cup -- Pharyngeal -- Pharyngeal- Nectar Teaspoon -- Pharyngeal -- Pharyngeal- Nectar Cup -- Pharyngeal -- Pharyngeal- Nectar Straw -- Pharyngeal -- Pharyngeal- Thin Teaspoon -- Pharyngeal -- Pharyngeal- Thin Cup -- Pharyngeal -- Pharyngeal- Thin Straw -- Pharyngeal -- Pharyngeal- Puree -- Pharyngeal -- Pharyngeal- Mechanical Soft -- Pharyngeal -- Pharyngeal- Regular -- Pharyngeal -- Pharyngeal- Multi-consistency -- Pharyngeal -- Pharyngeal- Pill -- Pharyngeal -- Pharyngeal Comment --  CHL IP CERVICAL ESOPHAGEAL PHASE 10/01/2020 Cervical Esophageal Phase WFL Pudding Teaspoon -- Pudding Cup -- Honey Teaspoon -- Honey Cup -- Nectar Teaspoon -- Nectar Cup -- Nectar Straw -- Thin Teaspoon -- Thin Cup -- Thin Straw -- Puree -- Mechanical Soft -- Regular -- Multi-consistency -- Pill -- Cervical Esophageal Comment -- Osie Bond., M.A. CCC-SLP Acute Rehabilitation Services Pager [phone] Office [phone] 10/01/2020, 1:53 PM               ECHO TEE  Result Date: 10/02/2020    TRANSESOPHOGEAL ECHO REPORT   Patient Name:   Tracey Meyer Date of Exam: 10/02/2020 Medical Rec #:  [id number]          Height:       64.0 in Accession #:    [id number]         Weight:       154.5 lb Date of Birth:  [birth year]          BSA:          1.753 m Patient Age:    40 years           BP:           147/70 mmHg Patient Gender: F                  HR:           75 bpm. Exam Location:  Inpatient Procedure: Transesophageal Echo, 3D Echo, Cardiac Doppler, Color Doppler and            Strain Analysis Indications:    Cerebral Infarction, unspecified I63.9  History:        Patient has prior history of Echocardiogram examinations, most                 recent 09/28/2020. CHF; Risk Factors:Diabetes, Dyslipidemia and                 Current Smoker.  Sonographer:    Darlina Sicilian RDCS Referring Phys: [place]: After discussion of the risks and benefits of a TEE, an informed consent was obtained from the patient. TEE procedure time was 11 minutes. The transesophogeal probe was passed without difficulty through the esophogus of the patient. Imaged were obtained with the patient in a left lateral decubitus position. Sedation performed by different physician. The patient was monitored while under deep sedation. Image quality was good. The patient's vital signs; including heart rate, blood pressure, and oxygen saturation; remained stable throughout the procedure. The patient developed no complications during the procedure. IMPRESSIONS  1. Akinesis of the anteroseptal wall and apex with overall moderate to severe  LV dysfunction; no obvious apical thrombus noted using definity.  2. Left ventricular ejection fraction, by estimation, is 30 to 35%. The left ventricle has moderate to severely decreased function. The left ventricle demonstrates regional wall motion abnormalities (see scoring diagram/findings for description). The left ventricular internal cavity  size was mildly dilated.  3. Right ventricular systolic function is normal. The right ventricular size is normal.  4. Left atrial size was mildly dilated. No left atrial/left atrial appendage thrombus was detected.  5. A small pericardial effusion is present.  6. The mitral valve is normal in structure. Mild mitral valve regurgitation.  7. The aortic valve is tricuspid. Aortic valve regurgitation is not visualized.  8. There is mild (Grade II) plaque involving the descending aorta.  9. Agitated saline contrast bubble study was positive with shunting observed after >6 cardiac cycles suggestive of intrapulmonary shunting. FINDINGS  Left Ventricle: Left ventricular ejection fraction, by estimation, is 30 to 35%. The left ventricle has moderate to severely decreased function. The left ventricle demonstrates regional wall motion abnormalities. The left ventricular internal cavity size was mildly dilated. Right Ventricle: The right ventricular size is normal. Right ventricular systolic function is normal. Left Atrium: Left atrial size was mildly dilated. No left atrial/left atrial appendage thrombus was detected. Right Atrium: Right atrial size was normal in size. Pericardium: A small pericardial effusion is present. Mitral Valve: The mitral valve is normal in structure. Mild mitral valve regurgitation. Tricuspid Valve: The tricuspid valve is normal in structure. Tricuspid valve regurgitation is trivial. Aortic Valve: The aortic valve is tricuspid. Aortic valve regurgitation is not visualized. Pulmonic Valve: The pulmonic valve was normal in structure. Pulmonic valve regurgitation is not visualized. Aorta: The aortic root is normal in size and structure. There is mild (Grade II) plaque involving the descending aorta. IAS/Shunts: No atrial level shunt detected by color flow Doppler. Agitated saline contrast bubble study was positive with shunting observed after >6 cardiac cycles suggestive of intrapulmonary shunting.  Additional Comments: Akinesis of the anteroseptal wall and apex with overall moderate to severe LV dysfunction; no obvious apical thrombus noted using definity.   3D Volume EF LV 3D EDV:   115.45 ml LV 3D ESV:   76.35 ml  AORTA Ao Root diam: 2.50 cm Ao Asc diam:  2.70 cm MITRAL VALVE MV Area (plan): 5.03 cm Kirk Ruths MD Electronically signed by Kirk Ruths MD Signature Date/Time: 10/02/2020/10:16:20 AM    Final       Scheduled Meds:  Derrill Memo ON 10/03/2020] aspirin EC  81 mg Oral Daily   enoxaparin (LOVENOX) injection  40 mg Subcutaneous Q24H   famotidine  20 mg Oral Daily   insulin aspart  0-5 Units Subcutaneous QHS   insulin aspart  0-9 Units Subcutaneous TID WC   metoprolol succinate  25 mg Oral Daily   rosuvastatin  40 mg Oral Daily   ticagrelor  90 mg Oral BID   Continuous Infusions:    LOS: 5 days      Debbe Odea, MD [facility] Pager: [email or web address] 10/02/2020, 5:20 PM

## 2020-10-02 NOTE — TOC CAGE-AID Note (Signed)
Transition of Care [facility]) - CAGE-AID Screening   Patient Details  Name: Tracey Meyer MRN: [id number] Date of Birth: Feb 16, 1961  Transition of Care [facility]) CM/SW Contact:    Rinnah Peppel C Tarpley-Carter, [place] Phone Number: 10/02/2020, 2:29 PM   Clinical Narrative: Pt.participated in [place].  Pt stated she stopped drinking beer and smoking cigarettes a week ago.  Pt was offered resources.  Pt stated they did not feel that they were in need of resources at this time.   Mouna Yager Tarpley-Carter, MSW, LCSW-A Pronouns:  She/Her/Hers                          [place] Clinical Social WorkerTransitions of Care Cell:  [phone] [email or web address]   CAGE-AID Screening:    Have You Ever Felt You Ought to Cut Down on Your Drinking or Drug Use?: Yes Have People Annoyed You By [employer] Your Drinking Or Drug Use?: No Have You Felt Bad Or Guilty About Your Drinking Or Drug Use?: No Have You Ever Had a Drink or Used Drugs First Thing In The Morning to Steady Your Nerves or to Get Rid of a Hangover?: No CAGE-AID Score: 1  Substance Abuse Education Offered: Yes  Substance abuse interventions: Educational Materials (Pt stopped drinking and smoking cigarette's a week ago.)

## 2020-10-02 NOTE — Plan of Care (Signed)
  Problem: Education: Goal: Knowledge of General Education information will improve Description: Including pain rating scale, medication(s)/side effects and non-pharmacologic comfort measures Outcome: Progressing   Problem: Health Behavior/Discharge Planning: Goal: Ability to manage health-related needs will improve Outcome: Progressing   Problem: Clinical Measurements: Goal: Ability to maintain clinical measurements within normal limits will improve Outcome: Progressing Goal: Will remain free from infection Outcome: Progressing Goal: Diagnostic test results will improve Outcome: Progressing Goal: Respiratory complications will improve Outcome: Progressing Goal: Cardiovascular complication will be avoided Outcome: Progressing   Problem: Activity: Goal: Risk for activity intolerance will decrease Outcome: Progressing   Problem: Nutrition: Goal: Adequate nutrition will be maintained Outcome: Progressing   Problem: Coping: Goal: Level of anxiety will decrease Outcome: Progressing   Problem: Elimination: Goal: Will not experience complications related to bowel motility Outcome: Progressing Goal: Will not experience complications related to urinary retention Outcome: Progressing   Problem: Pain Managment: Goal: General experience of comfort will improve Outcome: Progressing   Problem: Safety: Goal: Ability to remain free from injury will improve Outcome: Progressing   Problem: Skin Integrity: Goal: Risk for impaired skin integrity will decrease Outcome: Progressing   Problem: Education: Goal: Knowledge of disease or condition will improve Outcome: Progressing Goal: Knowledge of secondary prevention will improve Outcome: Progressing Goal: Knowledge of patient specific risk factors addressed and post discharge goals established will improve Outcome: Progressing Goal: Individualized Educational Video(s) Outcome: Progressing   Problem: Coping: Goal: Will verbalize  positive feelings about self Outcome: Progressing Goal: Will identify appropriate support needs Outcome: Progressing   Problem: Health Behavior/Discharge Planning: Goal: Ability to manage health-related needs will improve Outcome: Progressing   Problem: Self-Care: Goal: Ability to participate in self-care as condition permits will improve Outcome: Progressing Goal: Ability to communicate needs accurately will improve Outcome: Progressing

## 2020-10-02 NOTE — Progress Notes (Signed)
STROKE TEAM PROGRESS NOTE   INTERVAL HISTORY Patient is lying in bed, left hemiplegia had significant improving today. LUE 2/5 and LLE 2/5 proximal, and knee extension 3-/5. Had TEE which showed no LV thrombus. Pending CIR  Vitals:   10/02/20 0834 10/02/20 0846 10/02/20 0905 10/02/20 1402  BP: (!) 124/55 (!) 147/70  (!) 114/95  Pulse: 73 75  79  Resp: (!) 24 17  17   Temp: 98.8 F (37.1 C)  (!) 97.5 F (36.4 C) 97.6 F (36.4 C)  TempSrc: Axillary  Oral Oral  SpO2: 100% 99%  96%  Weight:      Height:       CBC:  Recent Labs  Lab 09/27/20 1900 09/27/20 1950  WBC 6.9  --   NEUTROABS 4.9  --   HGB 12.1 12.2  HCT 36.4 36.0  MCV 94.3  --   PLT 352  --    Basic Metabolic Panel:  Recent Labs  Lab 09/27/20 1900 09/27/20 1950 09/29/20 0912  NA 134* 138 135  K 3.8 3.8 4.1  CL 104 108 104  CO2 18*  --  22  GLUCOSE 205* 201* 145*  BUN 26* 26* 24*  CREATININE 1.30* 1.20* 1.10*  CALCIUM 9.7  --  9.6   Lipid Panel:  Recent Labs  Lab 09/28/20 0404  CHOL 186  TRIG 100  HDL 36*  CHOLHDL 5.2  VLDL 20  LDLCALC 130*   HgbA1c:  Recent Labs  Lab 09/28/20 0403  HGBA1C 6.8*    Alcohol Level  Recent Labs  Lab 09/27/20 1938  ETH <10    IMAGING past 24 hours CT ANGIO HEAD NECK W WO CM  Result Date: 10/01/2020 CLINICAL DATA:  Follow-up stroke. Left-sided weakness. 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 EXAM: CT ANGIOGRAPHY HEAD AND NECK TECHNIQUE: Multidetector CT imaging of the head and neck was performed using the standard protocol during bolus administration of intravenous contrast. Multiplanar CT image reconstructions and MIPs were obtained to evaluate the vascular anatomy. Carotid stenosis measurements (when applicable) are obtained utilizing NASCET criteria, using the distal internal carotid diameter as the denominator. CONTRAST:  2mL OMNIPAQUE IOHEXOL 350 MG/ML SOLN COMPARISON:  MRI studies  09/28/2020 FINDINGS: CT HEAD FINDINGS Brain: Acute infarction at the pontomedullary junction shown by MRI is difficult to appreciate by CT. No focal cerebellar finding. Low-density in the right occipital lobe consistent with the 2 cm acute infarction in that region. Other punctate acute infarctions scattered within both hemispheres cannot be specifically appreciated by CT. Chronic small-vessel ischemic changes are present throughout the cerebral hemispheric white matter. No hemorrhage, hydrocephalus or extra-axial collection. Vascular: There is atherosclerotic calcification of the major vessels at the base of the brain. Skull: Negative Sinuses: Clear/normal Orbits: Normal Review of the MIP images confirms the above findings CTA NECK FINDINGS Aortic arch: No aortic atherosclerotic calcification is seen. No dissection. Branching pattern is normal without origin stenosis. Right carotid system: Common carotid artery widely patent to the bifurcation. There is calcified plaque at the carotid bifurcation but without stenosis. Left carotid system: Common carotid artery widely patent to the bifurcation. There is calcified plaque at the carotid bifurcation and ICA bulb but no stenosis. Vertebral arteries: Both vertebral artery origins are widely patent. Both vertebral arteries appear normal through the cervical region to the foramen magnum. Skeleton: Minimal cervical spondylosis. Other neck: No mass or lymphadenopathy. Upper chest: Normal Review of the MIP images confirms the above findings  CTA HEAD FINDINGS Anterior circulation: Both internal carotid arteries are patent through the skull base and siphon regions. The anterior and middle cerebral vessels are patent. No large vessel occlusion or correctable proximal stenosis. No aneurysm or vascular malformation. Posterior circulation: Left vertebral artery is dominant and is widely patent through the foramen magnum. Mild narrowing in the distal V4 segment, but the vessel is  patent to the basilar. Right vertebral artery either terminates in PICA or is occluded beyond PICA. Proximal basilar artery shows mild atherosclerotic irregularity. Superior cerebellar and posterior cerebral arteries are patent. Venous sinuses: Patent and normal. Anatomic variants: None significant otherwise. Review of the MIP images confirms the above findings IMPRESSION: The aorta has a normal appearance. Mild atherosclerotic plaque at both carotid bifurcations but without stenosis on either side. No evidence of ulceration or pronounced irregularity. Mild atherosclerotic change at the carotid siphons but without stenosis. No intracranial large or medium vessel occlusion in the anterior circulation. Right vertebral artery either terminates in PICA or is occluded distal to PICA. Left vertebral artery shows a moderate stenosis of the distal V4 segment but does remain patent to the basilar. Mild atherosclerotic irregularity of the proximal basilar artery. Electronically Signed   By: Nelson Chimes M.D.   On: 10/01/2020 19:34   DG Chest 2 View  Result Date: 09/23/2020 CLINICAL DATA:  Question right lung pneumonia on outside radiograph. EXAM: CHEST - 2 VIEW COMPARISON:  Radiograph yesterday at [facility]. FINDINGS: Lung volumes are low. Borderline cardiomegaly is similar. There is improved right basilar aeration with mild residual atelectasis. No new airspace disease. There is minimal fluid in the fissures without large subpulmonic effusion. No pneumothorax. Stable osseous structures. IMPRESSION: 1. Improved aeration at the right lung base with mild residual atelectasis. No new airspace disease. 2. Trace fluid in the fissures.  Borderline cardiomegaly. Electronically Signed   By: Keith Rake M.D.   On: 09/23/2020 17:51   CT HEAD WO CONTRAST  Result Date: 09/27/2020 CLINICAL DATA:  Left-sided weakness. EXAM: CT HEAD WITHOUT CONTRAST TECHNIQUE: Contiguous axial images were obtained from the base of the  skull through the vertex without intravenous contrast. COMPARISON:  None. FINDINGS: Brain: 18 x 16 x 17 mm focus of peripheral hypo attenuation is identified in posterior right parietooccipital region (image 15/series 2). No evidence for acute hemorrhage, hydrocephalus, or abnormal extra-axial fluid collection. Patchy low attenuation in the deep hemispheric and periventricular white matter is nonspecific, but likely reflects chronic microvascular ischemic demyelination. Vascular: No hyperdense vessel or unexpected calcification. Skull: No evidence for fracture. No worrisome lytic or sclerotic lesion. Sinuses/Orbits: The visualized paranasal sinuses and mastoid air cells are clear. Visualized portions of the globes and intraorbital fat are unremarkable. Other: None. IMPRESSION: 1. 18 x 16 x 17 mm focus of peripheral hypo attenuation in the posterior right parietooccipital region. This is nonspecific and could be related to subacute infarct, but given the somewhat rounded configuration, MRI of the brain without and with contrast recommended to further evaluate. 2. Chronic small vessel white matter ischemic disease. Electronically Signed   By: Misty Stanley M.D.   On: 09/27/2020 19:42   MR ANGIO HEAD WO CONTRAST  Result Date: 09/28/2020 CLINICAL DATA:  Initial evaluation for neuro deficit, stroke suspected, left-sided weakness. EXAM: MRI HEAD WITHOUT AND WITH CONTRAST MRA HEAD WITHOUT CONTRAST MRA NECK WITHOUT AND WITH CONTRAST TECHNIQUE: Multiplanar, multi-echo pulse sequences of the brain and surrounding structures were acquired without intravenous contrast. Angiographic images of the Circle of Willis were acquired  using MRA technique without intravenous contrast. Angiographic images of the neck were acquired using MRA technique without and with intravenous contrast. Carotid stenosis measurements (when applicable) are obtained utilizing NASCET criteria, using the distal internal carotid diameter as the  denominator. CONTRAST:  60mL GADAVIST GADOBUTROL 1 MMOL/ML IV SOLN COMPARISON:  Prior CT from 09/27/2020. FINDINGS: MRI HEAD FINDINGS Brain: Examination moderately degraded by motion artifact. Cerebral volume within normal limits. Patchy T2/FLAIR hyperintensity seen involving the periventricular deep white matter of both cerebral hemispheres, with patchy involvement of the thalami and pons, most consistent with chronic microvascular ischemic disease, moderate in nature. Few scatter remote lacunar infarcts present at the left thalamus and pons. Multiple scattered foci of restricted diffusion seen involving the bilateral cerebral and cerebellar hemispheres, consistent with acute to early subacute ischemic infarcts. The largest infarct involving the left cerebral hemisphere seen involving the cortical and subcortical posterior left frontal lobe in measures 1.4 cm (series 7, image 51). Largest infarct involving the right cerebral hemisphere involves the right occipital cortex and measures 2 cm (series 5, image 74). This accounts for the hypodensity seen on prior CT. Few small cerebellar infarcts measure up to 9 mm on the right. Patchy infarct measuring 1.2 cm seen involving the central/right paracentral medulla (series 5, image 62). No associated hemorrhage or mass effect. Minimal associated enhancement seen about the dominant right occipital infarct. No mass lesion, midline shift, or significant mass effect. No hydrocephalus or extra-axial fluid collection. Pituitary gland suprasellar region within normal limits. Midline structures intact. No other abnormal enhancement. Vascular: Major intracranial vascular flow voids are maintained. Skull and upper cervical spine: Craniocervical junction within normal limits. Bone marrow signal intensity normal. No scalp soft tissue abnormality. Sinuses/Orbits: Globes and orbital soft tissues demonstrate no acute finding. Mild scattered mucosal thickening noted within the ethmoidal air  cells. Paranasal sinuses are otherwise clear. Trace bilateral mastoid effusions noted, of doubtful significance. Inner ear structures grossly normal. Other: None. MRA HEAD FINDINGS Anterior circulation: Examination degraded by motion artifact. Visualized distal cervical segments of the internal carotid arteries are patent with antegrade flow. Petrous segments patent bilaterally. Atheromatous irregularity throughout the carotid siphons bilaterally. No significant stenosis on the left. On the right, there is a probable moderate stenosis involving the proximal cavernous right ICA, better seen on corresponding MRA neck portion of this study (series 4, image 30). A1 segments patent bilaterally. Normal anterior communicating artery complex. Anterior cerebral arteries grossly patent to their distal aspects. No visible M1 stenosis or occlusion. Normal MCA bifurcations. Distal MCA branches well perfused and fairly symmetric. Posterior circulation: Left vertebral artery dominant. Irregular moderate stenosis seen involving the distal left V4 segment just prior to the vertebrobasilar junction, better seen on MRA neck portion of this exam (series 2, image 54). Left PICA not visualized. Right vertebral artery diminutive occludes beyond the takeoff of the right PICA. Partially visualized proximal right PICA patent. Basilar patent proximally. Apparent short-segment severe stenosis involving the mid basilar artery on MIP reconstructions is not seen on corresponding time-of-flight sequence, favored to be artifactual. Mild to moderate stenosis noted at the basilar tip (series 2, image 44). Superior cerebellar arteries patent bilaterally. Right PCA primarily supplied via the basilar. Left PCA supplied via the basilar as well as a robust left posterior communicating artery. PCAs perfused to their distal aspects without definite high-grade stenosis. No visible aneurysm on this motion degraded exam. Anatomic variants: Diminutive right  vertebral artery largely terminates in PICA. Prominent left posterior communicating artery. MRA NECK FINDINGS Aortic  arch: Examination moderately degraded by motion. Visualized aortic arch normal caliber with normal branch pattern. No hemodynamically significant stenosis seen about the origin of the great vessels. Right carotid system: Right CCA patent from its origin to the bifurcation without stenosis. Atheromatous irregularity seen about the right carotid bulb/proximal right ICA with no more than mild 20% stenosis by NASCET criteria. Right ICA patent distally without stenosis, evidence for dissection, or occlusion. Left carotid system: Left CCA patent from its origin to the bifurcation without stenosis. Atheromatous irregularity with up to 70% short-segment stenosis at the origin of the left ICA (series 1060, image 3). Left ICA patent distally without additional stenosis, evidence for dissection or occlusion. Vertebral arteries: Both vertebral arteries arise from subclavian arteries. Vertebral arteries are patent without hemodynamically significant stenosis. No evidence for dissection or occlusion. Other: None IMPRESSION: MRI HEAD: 1. 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 Given the various vascular distributions involved, a central thromboembolic etiology is suspected. 2. Underlying moderate chronic microvascular ischemic disease with a few scattered remote lacunar infarcts involving the left thalamus and pons. MRA HEAD: 1. Technically limited exam due to motion artifact. 2. Negative intracranial MRA for large vessel occlusion. Intracranial atherosclerotic disease with associated moderate stenoses involving the distal left V4 segment, distal basilar artery, and cavernous right ICA as above. 3. Occlusion of the right V4  segment beyond the takeoff of the right PICA. MRA NECK: 1. Technically limited exam due to motion artifact. 2. Approximate 70% atheromatous stenosis at the origin of the left ICA. 3. Otherwise negative MRA of the neck. No other hemodynamically significant or correctable stenosis identified. Electronically Signed   By: Jeannine Boga M.D.   On: 09/28/2020 03:18   MR ANGIO NECK W WO CONTRAST  Result Date: 09/28/2020 CLINICAL DATA:  Initial evaluation for neuro deficit, stroke suspected, left-sided weakness. EXAM: MRI HEAD WITHOUT AND WITH CONTRAST MRA HEAD WITHOUT CONTRAST MRA NECK WITHOUT AND WITH CONTRAST TECHNIQUE: Multiplanar, multi-echo pulse sequences of the brain and surrounding structures were acquired without intravenous contrast. Angiographic images of the Circle of Willis were acquired using MRA technique without intravenous contrast. Angiographic images of the neck were acquired using MRA technique without and with intravenous contrast. Carotid stenosis measurements (when applicable) are obtained utilizing NASCET criteria, using the distal internal carotid diameter as the denominator. CONTRAST:  64mL GADAVIST GADOBUTROL 1 MMOL/ML IV SOLN COMPARISON:  Prior CT from 09/27/2020. FINDINGS: MRI HEAD FINDINGS Brain: Examination moderately degraded by motion artifact. Cerebral volume within normal limits. Patchy T2/FLAIR hyperintensity seen involving the periventricular deep white matter of both cerebral hemispheres, with patchy involvement of the thalami and pons, most consistent with chronic microvascular ischemic disease, moderate in nature. Few scatter remote lacunar infarcts present at the left thalamus and pons. Multiple scattered foci of restricted diffusion seen involving the bilateral cerebral and cerebellar hemispheres, consistent with acute to early subacute ischemic infarcts. The largest infarct involving the left cerebral hemisphere seen involving the cortical and subcortical posterior left  frontal lobe in measures 1.4 cm (series 7, image 51). Largest infarct involving the right cerebral hemisphere involves the right occipital cortex and measures 2 cm (series 5, image 74). This accounts for the hypodensity seen on prior CT. Few small cerebellar infarcts measure up to 9 mm on the right. Patchy infarct measuring 1.2 cm seen involving the central/right paracentral medulla (series  5, image 62). No associated hemorrhage or mass effect. Minimal associated enhancement seen about the dominant right occipital infarct. No mass lesion, midline shift, or significant mass effect. No hydrocephalus or extra-axial fluid collection. Pituitary gland suprasellar region within normal limits. Midline structures intact. No other abnormal enhancement. Vascular: Major intracranial vascular flow voids are maintained. Skull and upper cervical spine: Craniocervical junction within normal limits. Bone marrow signal intensity normal. No scalp soft tissue abnormality. Sinuses/Orbits: Globes and orbital soft tissues demonstrate no acute finding. Mild scattered mucosal thickening noted within the ethmoidal air cells. Paranasal sinuses are otherwise clear. Trace bilateral mastoid effusions noted, of doubtful significance. Inner ear structures grossly normal. Other: None. MRA HEAD FINDINGS Anterior circulation: Examination degraded by motion artifact. Visualized distal cervical segments of the internal carotid arteries are patent with antegrade flow. Petrous segments patent bilaterally. Atheromatous irregularity throughout the carotid siphons bilaterally. No significant stenosis on the left. On the right, there is a probable moderate stenosis involving the proximal cavernous right ICA, better seen on corresponding MRA neck portion of this study (series 4, image 30). A1 segments patent bilaterally. Normal anterior communicating artery complex. Anterior cerebral arteries grossly patent to their distal aspects. No visible M1 stenosis or  occlusion. Normal MCA bifurcations. Distal MCA branches well perfused and fairly symmetric. Posterior circulation: Left vertebral artery dominant. Irregular moderate stenosis seen involving the distal left V4 segment just prior to the vertebrobasilar junction, better seen on MRA neck portion of this exam (series 2, image 54). Left PICA not visualized. Right vertebral artery diminutive occludes beyond the takeoff of the right PICA. Partially visualized proximal right PICA patent. Basilar patent proximally. Apparent short-segment severe stenosis involving the mid basilar artery on MIP reconstructions is not seen on corresponding time-of-flight sequence, favored to be artifactual. Mild to moderate stenosis noted at the basilar tip (series 2, image 44). Superior cerebellar arteries patent bilaterally. Right PCA primarily supplied via the basilar. Left PCA supplied via the basilar as well as a robust left posterior communicating artery. PCAs perfused to their distal aspects without definite high-grade stenosis. No visible aneurysm on this motion degraded exam. Anatomic variants: Diminutive right vertebral artery largely terminates in PICA. Prominent left posterior communicating artery. MRA NECK FINDINGS Aortic arch: Examination moderately degraded by motion. Visualized aortic arch normal caliber with normal branch pattern. No hemodynamically significant stenosis seen about the origin of the great vessels. Right carotid system: Right CCA patent from its origin to the bifurcation without stenosis. Atheromatous irregularity seen about the right carotid bulb/proximal right ICA with no more than mild 20% stenosis by NASCET criteria. Right ICA patent distally without stenosis, evidence for dissection, or occlusion. Left carotid system: Left CCA patent from its origin to the bifurcation without stenosis. Atheromatous irregularity with up to 70% short-segment stenosis at the origin of the left ICA (series 1060, image 3). Left  ICA patent distally without additional stenosis, evidence for dissection or occlusion. Vertebral arteries: Both vertebral arteries arise from subclavian arteries. Vertebral arteries are patent without hemodynamically significant stenosis. No evidence for dissection or occlusion. Other: None IMPRESSION: MRI HEAD: 1. 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 Given the various vascular distributions involved, a central thromboembolic etiology is suspected. 2. Underlying moderate chronic microvascular ischemic disease with a few scattered remote lacunar infarcts involving the left thalamus and pons. MRA HEAD: 1. Technically limited exam  due to motion artifact. 2. Negative intracranial MRA for large vessel occlusion. Intracranial atherosclerotic disease with associated moderate stenoses involving the distal left V4 segment, distal basilar artery, and cavernous right ICA as above. 3. 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 MRA NECK: 1. Technically limited exam due to motion artifact. 2. Approximate 70% atheromatous stenosis at the origin of the left ICA. 3. Otherwise negative MRA of the neck. No other hemodynamically significant or correctable stenosis identified. Electronically Signed   By: Jeannine Boga M.D.   On: 09/28/2020 03:18   MR Brain W and Wo Contrast  Result Date: 09/28/2020 CLINICAL DATA:  Initial evaluation for neuro deficit, stroke suspected, left-sided weakness. EXAM: MRI HEAD WITHOUT AND WITH CONTRAST MRA HEAD WITHOUT CONTRAST MRA NECK WITHOUT AND WITH CONTRAST TECHNIQUE: Multiplanar, multi-echo pulse sequences of the brain and surrounding structures were acquired without intravenous contrast. Angiographic images of the Circle of Willis were acquired using MRA technique without  intravenous contrast. Angiographic images of the neck were acquired using MRA technique without and with intravenous contrast. Carotid stenosis measurements (when applicable) are obtained utilizing NASCET criteria, using the distal internal carotid diameter as the denominator. CONTRAST:  31mL GADAVIST GADOBUTROL 1 MMOL/ML IV SOLN COMPARISON:  Prior CT from 09/27/2020. FINDINGS: MRI HEAD FINDINGS Brain: Examination moderately degraded by motion artifact. Cerebral volume within normal limits. Patchy T2/FLAIR hyperintensity seen involving the periventricular deep white matter of both cerebral hemispheres, with patchy involvement of the thalami and pons, most consistent with chronic microvascular ischemic disease, moderate in nature. Few scatter remote lacunar infarcts present at the left thalamus and pons. Multiple scattered foci of restricted diffusion seen involving the bilateral cerebral and cerebellar hemispheres, consistent with acute to early subacute ischemic infarcts. The largest infarct involving the left cerebral hemisphere seen involving the cortical and subcortical posterior left frontal lobe in measures 1.4 cm (series 7, image 51). Largest infarct involving the right cerebral hemisphere involves the right occipital cortex and measures 2 cm (series 5, image 74). This accounts for the hypodensity seen on prior CT. Few small cerebellar infarcts measure up to 9 mm on the right. Patchy infarct measuring 1.2 cm seen involving the central/right paracentral medulla (series 5, image 62). No associated hemorrhage or mass effect. Minimal associated enhancement seen about the dominant right occipital infarct. No mass lesion, midline shift, or significant mass effect. No hydrocephalus or extra-axial fluid collection. Pituitary gland suprasellar region within normal limits. Midline structures intact. No other abnormal enhancement. Vascular: Major intracranial vascular flow voids are maintained. Skull and upper cervical  spine: Craniocervical junction within normal limits. Bone marrow signal intensity normal. No scalp soft tissue abnormality. Sinuses/Orbits: Globes and orbital soft tissues demonstrate no acute finding. Mild scattered mucosal thickening noted within the ethmoidal air cells. Paranasal sinuses are otherwise clear. Trace bilateral mastoid effusions noted, of doubtful significance. Inner ear structures grossly normal. Other: None. MRA HEAD FINDINGS Anterior circulation: Examination degraded by motion artifact. Visualized distal cervical segments of the internal carotid arteries are patent with antegrade flow. Petrous segments patent bilaterally. Atheromatous irregularity throughout the carotid siphons bilaterally. No significant stenosis on the left. On the right, there is a probable moderate stenosis involving the proximal cavernous right ICA, better seen on corresponding MRA neck portion of this study (series 4, image 30). A1 segments patent bilaterally. Normal anterior communicating artery complex. Anterior cerebral arteries grossly patent to their distal aspects. No visible M1 stenosis or occlusion. Normal MCA bifurcations. Distal MCA branches well  perfused and fairly symmetric. Posterior circulation: Left vertebral artery dominant. Irregular moderate stenosis seen involving the distal left V4 segment just prior to the vertebrobasilar junction, better seen on MRA neck portion of this exam (series 2, image 54). Left PICA not visualized. Right vertebral artery diminutive occludes beyond the takeoff of the right PICA. Partially visualized proximal right PICA patent. Basilar patent proximally. Apparent short-segment severe stenosis involving the mid basilar artery on MIP reconstructions is not seen on corresponding time-of-flight sequence, favored to be artifactual. Mild to moderate stenosis noted at the basilar tip (series 2, image 44). Superior cerebellar arteries patent bilaterally. Right PCA primarily supplied via  the basilar. Left PCA supplied via the basilar as well as a robust left posterior communicating artery. PCAs perfused to their distal aspects without definite high-grade stenosis. No visible aneurysm on this motion degraded exam. Anatomic variants: Diminutive right vertebral artery largely terminates in PICA. Prominent left posterior communicating artery. MRA NECK FINDINGS Aortic arch: Examination moderately degraded by motion. Visualized aortic arch normal caliber with normal branch pattern. No hemodynamically significant stenosis seen about the origin of the great vessels. Right carotid system: Right CCA patent from its origin to the bifurcation without stenosis. Atheromatous irregularity seen about the right carotid bulb/proximal right ICA with no more than mild 20% stenosis by NASCET criteria. Right ICA patent distally without stenosis, evidence for dissection, or occlusion. Left carotid system: Left CCA patent from its origin to the bifurcation without stenosis. Atheromatous irregularity with up to 70% short-segment stenosis at the origin of the left ICA (series 1060, image 3). Left ICA patent distally without additional stenosis, evidence for dissection or occlusion. Vertebral arteries: Both vertebral arteries arise from subclavian arteries. Vertebral arteries are patent without hemodynamically significant stenosis. No evidence for dissection or occlusion. Other: None IMPRESSION: MRI HEAD: 1. 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 Given the various vascular distributions involved, a central thromboembolic etiology is suspected. 2. Underlying moderate chronic microvascular ischemic disease with a few scattered remote lacunar infarcts involving the left thalamus and pons. MRA HEAD: 1. Technically limited exam due to motion  artifact. 2. Negative intracranial MRA for large vessel occlusion. Intracranial atherosclerotic disease with associated moderate stenoses involving the distal left V4 segment, distal basilar artery, and cavernous right ICA as above. 3. 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 MRA NECK: 1. Technically limited exam due to motion artifact. 2. Approximate 70% atheromatous stenosis at the origin of the left ICA. 3. Otherwise negative MRA of the neck. No other hemodynamically significant or correctable stenosis identified. Electronically Signed   By: Jeannine Boga M.D.   On: 09/28/2020 03:18   CARDIAC CATHETERIZATION  Result Date: 09/22/2020  RPDA lesion is 50% stenosed.  Dist Cx lesion is 30% stenosed with 30% stenosed side branch in LPAV.  1st Diag lesion is 60% stenosed.  Mid LAD lesion is 100% stenosed.  Post intervention, there is a 0% residual stenosis.  A drug-eluting stent was successfully placed using a STENT ONYX FRONTIER [id number].  LV end diastolic pressure is mildly elevated.  1. 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 Mildly elevated LVEDP 21 mmHg 3. Successful PCI of the mid LAD with DES x 1 Plan: DAPT for one year. I suspect she will have significant myocardial injury given late presentation. Will check  Echo. Aggressive risk factor modification.   DG Chest Portable 1 View  Result Date: 09/27/2020 CLINICAL DATA:  Code STEMI.  Shortness of breath and recent MI. EXAM: PORTABLE CHEST 1 VIEW COMPARISON:  09/23/2020 FINDINGS: The heart size and mediastinal contours are within normal limits. Both lungs are clear. The visualized skeletal structures are unremarkable. IMPRESSION: No active disease. Electronically Signed   By: Lucienne Capers M.D.   On: 09/27/2020 20:02   DG Swallowing Func-Speech Pathology  Result Date: 10/01/2020 Formatting of this result is different from the original. Objective Swallowing Evaluation: Type of Study:  MBS-Modified Barium Swallow Study  Patient Details Name: AHAANA ROCHETTE MRN: [id number] Date of Birth: December 31, 1960 Today's Date: 10/01/2020 Time: SLP Start Time (ACUTE ONLY): 1159 -SLP Stop Time (ACUTE ONLY): 1212 SLP Time Calculation (min) (ACUTE ONLY): 13 min Past Medical History: Past Medical History: Diagnosis Date  Cardiomyopathy, ischemic 09/24/2020  DM (diabetes mellitus), type 2 ([place]) 09/24/2020  HTN (hypertension)   Hyperlipidemia   Prediabetes   S/P angioplasty with stent to mLAD 09/22/20  09/24/2020  Tobacco abuse 09/24/2020  Transaminitis 09/24/2020 Past Surgical History: Past Surgical History: Procedure Laterality Date  BREAST SURGERY Left   age 9  CORONARY/GRAFT ACUTE MI REVASCULARIZATION N/A 09/22/2020  Procedure: Coronary/Graft Acute MI Revascularization;  Surgeon: Martinique, Peter M, MD;  Location: [facility];  Service: Cardiovascular;  Laterality: N/A;  LEFT HEART CATH AND CORONARY ANGIOGRAPHY N/A 09/22/2020  Procedure: LEFT HEART CATH AND CORONARY ANGIOGRAPHY;  Surgeon: Martinique, Peter M, MD;  Location: [facility];  Service: Cardiovascular;  Laterality: N/A; HPI: Pt is a 60 y.o. female who presented with L sided weakness, slurred speech, orthopnea,  and SOB. MRI brain 7/8: Multiple scattered acute to early subacute ischemic nonhemorrhagic infarcts involving the bilateral cerebral and cerebellar hemispheres as above, with largest area of infarction measuring up to 2 cm at the right occipital cortex. TPA was not given. 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 Pt passed the Yale swallow screen on 7/7 and 7/8 SLP consulted for swallowing on 7/10 due to "some difficulty swallowing and getting SOB when eating" as noted by referring MD. PMH: DM2, tobacco use and trying to quit, HLD, CAD s/p angio and stenting of mLAD 7/4.  Subjective: pt feels like she's coughing with PO intake, getting SOB Assessment / Plan / Recommendation CHL IP CLINICAL IMPRESSIONS 10/01/2020 Clinical Impression  Pt presents with functional oropharyngeal swallowing with good timing and efficiency so that she does not have any residuals or penetration/aspiration. She did have one moment of strong coughing that occurred shortly after swallowing, and she did acknowledge that this is what has been happening during meals, but there was no clear source for this coughing based on oropharyngeal function. Recommend continuing with regular solids and thin liquids as tolerated. SLP will f/u briefly given subjective symptoms. SLP Visit Diagnosis Dysphagia, unspecified (R13.10) Attention and concentration deficit following -- Frontal lobe and executive function deficit following -- Impact on safety and function No limitations   CHL IP TREATMENT RECOMMENDATION 10/01/2020 Treatment Recommendations Therapy as outlined in treatment plan below   Prognosis 10/01/2020 Prognosis for Safe Diet Advancement Good Barriers to Reach Goals -- Barriers/Prognosis Comment -- CHL IP DIET RECOMMENDATION 10/01/2020 SLP Diet Recommendations Regular solids;Thin liquid Liquid Administration via Cup;Straw Medication Administration Whole meds with liquid Compensations Slow rate;Small sips/bites Postural Changes Seated upright at 90 degrees   CHL IP OTHER RECOMMENDATIONS 10/01/2020 Recommended Consults -- Oral Care Recommendations Oral  care BID Other Recommendations --   CHL IP FOLLOW UP RECOMMENDATIONS 10/01/2020 Follow up Recommendations Inpatient Rehab   CHL IP FREQUENCY AND DURATION 10/01/2020 Speech Therapy Frequency (ACUTE ONLY) min 1 x/week Treatment Duration 1 week      CHL IP ORAL PHASE 10/01/2020 Oral Phase WFL Oral - Pudding Teaspoon -- Oral - Pudding Cup -- Oral - Honey Teaspoon -- Oral - Honey Cup -- Oral - Nectar Teaspoon -- Oral - Nectar Cup -- Oral - Nectar Straw -- Oral - Thin Teaspoon -- Oral - Thin Cup -- Oral - Thin Straw -- Oral - Puree -- Oral - Mech Soft -- Oral - Regular -- Oral - Multi-Consistency -- Oral - Pill -- Oral Phase - Comment --  CHL IP  PHARYNGEAL PHASE 10/01/2020 Pharyngeal Phase WFL Pharyngeal- Pudding Teaspoon -- Pharyngeal -- Pharyngeal- Pudding Cup -- Pharyngeal -- Pharyngeal- Honey Teaspoon -- Pharyngeal -- Pharyngeal- Honey Cup -- Pharyngeal -- Pharyngeal- Nectar Teaspoon -- Pharyngeal -- Pharyngeal- Nectar Cup -- Pharyngeal -- Pharyngeal- Nectar Straw -- Pharyngeal -- Pharyngeal- Thin Teaspoon -- Pharyngeal -- Pharyngeal- Thin Cup -- Pharyngeal -- Pharyngeal- Thin Straw -- Pharyngeal -- Pharyngeal- Puree -- Pharyngeal -- Pharyngeal- Mechanical Soft -- Pharyngeal -- Pharyngeal- Regular -- Pharyngeal -- Pharyngeal- Multi-consistency -- Pharyngeal -- Pharyngeal- Pill -- Pharyngeal -- Pharyngeal Comment --  CHL IP CERVICAL ESOPHAGEAL PHASE 10/01/2020 Cervical Esophageal Phase WFL Pudding Teaspoon -- Pudding Cup -- Honey Teaspoon -- Honey Cup -- Nectar Teaspoon -- Nectar Cup -- Nectar Straw -- Thin Teaspoon -- Thin Cup -- Thin Straw -- Puree -- Mechanical Soft -- Regular -- Multi-consistency -- Pill -- Cervical Esophageal Comment -- Osie Bond., M.A. CCC-SLP Acute Rehabilitation Services Pager [phone] Office [phone] 10/01/2020, 1:53 PM              ECHOCARDIOGRAM COMPLETE  Result Date: 09/28/2020    ECHOCARDIOGRAM REPORT   Patient Name:   LARAYAH CLUTE Date of Exam: 09/28/2020 Medical Rec #:  [id number]          Height:       64.0 in Accession #:    [id number]         Weight:       175.0 lb Date of Birth:  [birth year]          BSA:          1.848 m Patient Age:    84 years           BP:           139/78 mmHg Patient Gender: F                  HR:           62 bpm. Exam Location:  Inpatient Procedure: 2D Echo, Cardiac Doppler and Color Doppler Indications:    CHF  History:        Patient has prior history of Echocardiogram examinations, most                 recent 09/22/2020. Risk Factors:Diabetes, Dyslipidemia and Current                 Smoker.  Sonographer:    Cammy Brochure Referring Phys: [place]  1. Left  ventricular ejection fraction, by estimation, is 30 to 35%. The left ventricle has moderately decreased function. The left ventricle demonstrates regional wall motion abnormalities (see scoring diagram/findings for description). There is mild left ventricular hypertrophy. Left ventricular diastolic parameters  are consistent with Grade II diastolic dysfunction (pseudonormalization). Elevated left atrial pressure.  2. No LV thrombus seen  3. Right ventricular systolic function is normal. The right ventricular size is normal. Tricuspid regurgitation signal is inadequate for assessing PA pressure.  4. The mitral valve is normal in structure. Trivial mitral valve regurgitation.  5. The aortic valve was not well visualized. Aortic valve regurgitation is not visualized. No aortic stenosis is present.  6. The inferior vena cava is normal in size with greater than 50% respiratory variability, suggesting right atrial pressure of 3 mmHg. FINDINGS  Left Ventricle: Left ventricular ejection fraction, by estimation, is 30 to 35%. The left ventricle has moderately decreased function. The left ventricle demonstrates regional wall motion abnormalities. The left ventricular internal cavity size was normal in size. There is mild left ventricular hypertrophy. Left ventricular diastolic parameters are consistent with Grade II diastolic dysfunction (pseudonormalization). Elevated left atrial pressure.  LV Wall Scoring: The mid and distal anterior wall, mid and distal anterior septum, mid inferoseptal segment, and apex are akinetic. The apical inferior segment is hypokinetic. The entire lateral wall, inferior wall, basal anteroseptal segment, basal anterior segment, and basal inferoseptal segment are normal. Right Ventricle: The right ventricular size is normal. No increase in right ventricular wall thickness. Right ventricular systolic function is normal. Tricuspid regurgitation signal is inadequate for assessing PA pressure. Left  Atrium: Left atrial size was normal in size. Right Atrium: Right atrial size was normal in size. Pericardium: There is no evidence of pericardial effusion. Mitral Valve: The mitral valve is normal in structure. Trivial mitral valve regurgitation. Tricuspid Valve: The tricuspid valve is normal in structure. Tricuspid valve regurgitation is trivial. Aortic Valve: The aortic valve was not well visualized. Aortic valve regurgitation is not visualized. No aortic stenosis is present. Aortic valve mean gradient measures 3.0 mmHg. Aortic valve peak gradient measures 5.7 mmHg. Aortic valve area, by VTI measures 1.73 cm. Pulmonic Valve: The pulmonic valve was not well visualized. Pulmonic valve regurgitation is not visualized. Aorta: The aortic root and ascending aorta are structurally normal, with no evidence of dilitation. Venous: The inferior vena cava is normal in size with greater than 50% respiratory variability, suggesting right atrial pressure of 3 mmHg. IAS/Shunts: The interatrial septum was not well visualized.  LEFT VENTRICLE PLAX 2D LVIDd:         5.10 cm  Diastology LVIDs:         3.80 cm  LV e' medial:    4.46 cm/s LV PW:         1.20 cm  LV E/e' medial:  19.5 LV IVS:        1.10 cm  LV e' lateral:   3.59 cm/s LVOT diam:     1.80 cm  LV E/e' lateral: 24.2 LV SV:         41 LV SV Index:   22 LVOT Area:     2.54 cm  RIGHT VENTRICLE RV Basal diam:  3.40 cm RV S prime:     7.94 cm/s LEFT ATRIUM             Index       RIGHT ATRIUM           Index LA diam:        3.70 cm 2.00 cm/m  RA Area:     10.50 cm LA Vol (A2C):   37.1 ml 20.07 ml/m RA Volume:   24.00 ml  12.98 ml/m LA Vol (A4C):  48.9 ml 26.45 ml/m LA Biplane Vol: 43.4 ml 23.48 ml/m  AORTIC VALVE AV Area (Vmax):    1.65 cm AV Area (Vmean):   1.59 cm AV Area (VTI):     1.73 cm AV Vmax:           119.00 cm/s AV Vmean:          75.400 cm/s AV VTI:            0.235 m AV Peak Grad:      5.7 mmHg AV Mean Grad:      3.0 mmHg LVOT Vmax:         77.00 cm/s  LVOT Vmean:        47.200 cm/s LVOT VTI:          0.160 m LVOT/AV VTI ratio: 0.68  AORTA Ao Root diam: 2.60 cm Ao Asc diam:  3.30 cm MITRAL VALVE               TRICUSPID VALVE MV Area (PHT): 4.06 cm    TR Peak grad:   19.7 mmHg MV Decel Time: 187 msec    TR Vmax:        222.00 cm/s MV E velocity: 87.00 cm/s MV A velocity: 60.80 cm/s  SHUNTS MV E/A ratio:  1.43        Systemic VTI:  0.16 m                            Systemic Diam: 1.80 cm Oswaldo Milian MD Electronically signed by Oswaldo Milian MD Signature Date/Time: 09/28/2020/4:34:28 PM    Final    ECHOCARDIOGRAM COMPLETE  Result Date: 09/22/2020    ECHOCARDIOGRAM REPORT   Patient Name:   TERIA KHACHATRYAN Date of Exam: 09/22/2020 Medical Rec #:  [id number]          Height: Accession #:    [id number]         Weight: Date of Birth:  1961-02-04          BSA: Patient Age:    29 years           BP:           180/99 mmHg Patient Gender: F                  HR:           83 bpm. Exam Location:  Inpatient Procedure: 2D Echo, Cardiac Doppler and Color Doppler Indications:    acute MI  History:        Patient has no prior history of Echocardiogram examinations.                 Acute MI, Signs/Symptoms:Chest Pain; Risk Factors:Hypertension,                 Dyslipidemia, Diabetes and Current Smoker.  Sonographer:    Dustin Flock Referring Phys: [id number] PETER M Martinique IMPRESSIONS  1. Apex well-visualized without contrast - false tendon (normal variant) - no thrombus. Left ventricular ejection fraction, by estimation, is 35 to 40%. The left ventricle has moderately decreased function. The left ventricle demonstrates regional wall motion abnormalities (see scoring diagram/findings for description). There is mild left ventricular hypertrophy. Left ventricular diastolic parameters are consistent with Grade I diastolic dysfunction (impaired relaxation). Elevated left ventricular end-diastolic pressure. There is severe hypokinesis of the left ventricular, entire  anterior wall, anteroseptal wall, apical segment and inferoapical segment. Findings suggest LAD  territory ischemia/infarct.  2. Right ventricular systolic function is normal. The right ventricular size is normal. There is normal pulmonary artery systolic pressure.  3. The mitral valve is abnormal. Mild mitral valve regurgitation.  4. The aortic valve is tricuspid. Aortic valve regurgitation is not visualized.  5. The inferior vena cava is normal in size with <50% respiratory variability, suggesting right atrial pressure of 8 mmHg. Comparison(s): No prior Echocardiogram. FINDINGS  Left Ventricle: Apex well-visualized without contrast - false tendon (normal variant) - no thrombus. Left ventricular ejection fraction, by estimation, is 35 to 40%. The left ventricle has moderately decreased function. The left ventricle demonstrates regional wall motion abnormalities. Severe hypokinesis of the left ventricular, entire anterior wall, anteroseptal wall, apical segment and inferoapical segment. The left ventricular internal cavity size was normal in size. There is mild left ventricular  hypertrophy. Left ventricular diastolic parameters are consistent with Grade I diastolic dysfunction (impaired relaxation). Elevated left ventricular end-diastolic pressure.  LV Wall Scoring: The mid and distal anterior wall, mid and distal anterior septum, and entire apex are hypokinetic. The antero-lateral wall, inferior wall, posterior wall, basal anteroseptal segment, mid inferoseptal segment, basal anterior segment, and basal inferoseptal segment are normal. Right Ventricle: The right ventricular size is normal. No increase in right ventricular wall thickness. Right ventricular systolic function is normal. There is normal pulmonary artery systolic pressure. The tricuspid regurgitant velocity is 2.58 m/s, and  with an assumed right atrial pressure of 8 mmHg, the estimated right ventricular systolic pressure is 62.2 mmHg. Left Atrium: Left  atrial size was normal in size. Right Atrium: Right atrial size was normal in size. Pericardium: There is no evidence of pericardial effusion. Mitral Valve: The mitral valve is abnormal. There is mild thickening of the mitral valve leaflet(s). Mild mitral valve regurgitation. Tricuspid Valve: The tricuspid valve is grossly normal. Tricuspid valve regurgitation is trivial. Aortic Valve: The aortic valve is tricuspid. Aortic valve regurgitation is not visualized. Pulmonic Valve: The pulmonic valve was grossly normal. Pulmonic valve regurgitation is not visualized. Aorta: The aortic root and ascending aorta are structurally normal, with no evidence of dilitation. Venous: The inferior vena cava is normal in size with less than 50% respiratory variability, suggesting right atrial pressure of 8 mmHg. IAS/Shunts: No atrial level shunt detected by color flow Doppler.  LEFT VENTRICLE PLAX 2D LVIDd:         4.90 cm      Diastology LVIDs:         4.40 cm      LV e' medial:    4.13 cm/s LV PW:         1.20 cm      LV E/e' medial:  16.2 LV IVS:        1.00 cm      LV e' lateral:   3.48 cm/s LVOT diam:     2.10 cm      LV E/e' lateral: 19.3 LV SV:         49 LVOT Area:     3.46 cm  LV Volumes (MOD) LV vol d, MOD A4C: 131.0 ml LV vol s, MOD A4C: 75.5 ml LV SV MOD A4C:     131.0 ml RIGHT VENTRICLE RV Basal diam:  2.20 cm RV S prime:     9.57 cm/s TAPSE (M-mode): 1.8 cm LEFT ATRIUM             RIGHT ATRIUM LA diam:        3.80 cm RA Area:  10.10 cm LA Vol (A2C):   28.4 ml RA Volume:   19.60 ml LA Vol (A4C):   34.6 ml LA Biplane Vol: 31.7 ml  AORTIC VALVE LVOT Vmax:   83.50 cm/s LVOT Vmean:  54.400 cm/s LVOT VTI:    0.141 m  AORTA Ao Root diam: 2.70 cm MITRAL VALVE                TRICUSPID VALVE MV Area (PHT): 6.65 cm     TR Peak grad:   26.6 mmHg MV Decel Time: 114 msec     TR Vmax:        258.00 cm/s MV E velocity: 67.00 cm/s MV A velocity: 100.00 cm/s  SHUNTS MV E/A ratio:  0.67         Systemic VTI:  0.14 m                              Systemic Diam: 2.10 cm Lyman Bishop MD Electronically signed by Lyman Bishop MD Signature Date/Time: 09/22/2020/1:04:05 PM    Final    ECHO TEE  Result Date: 10/02/2020    TRANSESOPHOGEAL ECHO REPORT   Patient Name:   MELEANA COMMERFORD Date of Exam: 10/02/2020 Medical Rec #:  [id number]          Height:       64.0 in Accession #:    [id number]         Weight:       154.5 lb Date of Birth:  08/28/1960          BSA:          1.753 m Patient Age:    60 years           BP:           147/70 mmHg Patient Gender: F                  HR:           75 bpm. Exam Location:  Inpatient Procedure: Transesophageal Echo, 3D Echo, Cardiac Doppler, Color Doppler and            Strain Analysis Indications:    Cerebral Infarction, unspecified I63.9  History:        Patient has prior history of Echocardiogram examinations, most                 recent 09/28/2020. CHF; Risk Factors:Diabetes, Dyslipidemia and                 Current Smoker.  Sonographer:    Darlina Sicilian RDCS Referring Phys: [place]: After discussion of the risks and benefits of a TEE, an informed consent was obtained from the patient. TEE procedure time was 11 minutes. The transesophogeal probe was passed without difficulty through the esophogus of the patient. Imaged were obtained with the patient in a left lateral decubitus position. Sedation performed by different physician. The patient was monitored while under deep sedation. Image quality was good. The patient's vital signs; including heart rate, blood pressure, and oxygen saturation; remained stable throughout the procedure. The patient developed no complications during the procedure. IMPRESSIONS  1. Akinesis of the anteroseptal wall and apex with overall moderate to severe LV dysfunction; no obvious apical thrombus noted using definity.  2. Left ventricular ejection fraction, by estimation, is 30 to 35%. The left ventricle has moderate to severely decreased function. The left  ventricle demonstrates regional wall motion abnormalities (see scoring diagram/findings for description). The left ventricular internal cavity size was mildly dilated.  3. Right ventricular systolic function is normal. The right ventricular size is normal.  4. Left atrial size was mildly dilated. No left atrial/left atrial appendage thrombus was detected.  5. A small pericardial effusion is present.  6. The mitral valve is normal in structure. Mild mitral valve regurgitation.  7. The aortic valve is tricuspid. Aortic valve regurgitation is not visualized.  8. There is mild (Grade II) plaque involving the descending aorta.  9. Agitated saline contrast bubble study was positive with shunting observed after >6 cardiac cycles suggestive of intrapulmonary shunting. FINDINGS  Left Ventricle: Left ventricular ejection fraction, by estimation, is 30 to 35%. The left ventricle has moderate to severely decreased function. The left ventricle demonstrates regional wall motion abnormalities. The left ventricular internal cavity size was mildly dilated. Right Ventricle: The right ventricular size is normal. Right ventricular systolic function is normal. Left Atrium: Left atrial size was mildly dilated. No left atrial/left atrial appendage thrombus was detected. Right Atrium: Right atrial size was normal in size. Pericardium: A small pericardial effusion is present. Mitral Valve: The mitral valve is normal in structure. Mild mitral valve regurgitation. Tricuspid Valve: The tricuspid valve is normal in structure. Tricuspid valve regurgitation is trivial. Aortic Valve: The aortic valve is tricuspid. Aortic valve regurgitation is not visualized. Pulmonic Valve: The pulmonic valve was normal in structure. Pulmonic valve regurgitation is not visualized. Aorta: The aortic root is normal in size and structure. There is mild (Grade II) plaque involving the descending aorta. IAS/Shunts: No atrial level shunt detected by color flow Doppler.  Agitated saline contrast bubble study was positive with shunting observed after >6 cardiac cycles suggestive of intrapulmonary shunting. Additional Comments: Akinesis of the anteroseptal wall and apex with overall moderate to severe LV dysfunction; no obvious apical thrombus noted using definity.   3D Volume EF LV 3D EDV:   115.45 ml LV 3D ESV:   76.35 ml  AORTA Ao Root diam: 2.50 cm Ao Asc diam:  2.70 cm MITRAL VALVE MV Area (plan): 5.03 cm Kirk Ruths MD Electronically signed by Kirk Ruths MD Signature Date/Time: 10/02/2020/10:16:20 AM    Final    ABORTED INVASIVE LAB PROCEDURE  Result Date: 09/28/2020 This case was aborted.    PHYSICAL EXAM Physical Exam  Constitutional: Appears well-developed and well-nourished.  Psych: Affect appropriate to situation Eyes: Normal external eye and conjunctiva. Cardiovascular: Normal rate and regular rhythm.   Neurological Examination On exam, patient awake alert, orientated x3, no aphasia, follows simple commands, able to name and repeat.  No gaze palsy, visual field full.  Mild left nasolabial fold flattening.  Left UE 2/5 and LLE 2/5 proximal and knee extension 3-/5. Right upper and lower extremity 5/5.  Sensation symmetrical bilaterally.  Right finger-to-nose intact.  ASSESSMENT/PLAN Tracey Meyer is a 60 y.o. female with PMH significant for DM2, tobacco use and trying to quit, HLD, CAD s/p angio and stenting of mLAD a few days ago who presents with L sided weakness that started on 09/24/20 after discharge. She has also been having shortness of breath and it gets worse with lying flat.  Stroke:  scattered infarct likely cardioembolic secondary to cardiomyopathy with low EF CT head :No acute abnormality. 8 x 16 x 17 mm focus of peripheral hypo attenuation in the posterior right parietooccipital region.  MRI  1. Multiple scattered acute to early subacute ischemic nonhemorrhagic infarcts involving  the bilateral cerebral and cerebellar hemispheres  as above, with largest area of infarction measuring up to 2 cm at the right occipital cortex. Given the various vascular distributions involved, a central thromboembolic etiology is suspected. 2. Underlying moderate chronic microvascular ischemic disease with a few scattered remote lacunar infarcts involving the left thalamus and pons. CTA head and neck only left V4 moderate stenosis, no left ICA stenosis or significant posterior circulation severe stenosis 2D Echo: 30-35% down from 35-40% on 09/22/20 TEE no LV or LA thrombus Recommend 30 day cardiac event monitoring to rule out afib LDL 130 HgbA1c 6.8 VTE prophylaxis - Lovenox aspirin 81 mg daily and Brilinta (ticagrelor) 90 mg bid prior to admission, now on aspirin 81 mg daily and Brilinta (ticagrelor) 90 mg bid.  Continue ASA and Brillinta per cardiology recommendations. Therapy recommendations: CIR Disposition:  pending  Recent STEMI s/p stent anterior STEMI 09/21/20 s/p LHC with PCI/DES to LAD.  TTE EF 35-40% with anterior hypokinesis but no apical thrombus at that time. This admission EF 30 to 35% On Aspirin and brilinta and statin Cardiology on board  Cardiomyopathy  Cardiology on board EF 35-40% on 09/22/20 Current TTE repeat EF 30-35% TEE no thrombus On coreg, entresto, spironolactone at home  NO left ICA stenosis MRA neck showed Left ICA 70% stenosis  likely asymptomatic at this time CT head and neck left V4 moderate stenosis. No Left ICA significant stenosis  Hypertension Home meds:  coreg, entresto, spironolactone Stable Gradually towards goal in 3-5 days BP goal normotensive  Hyperlipidemia Home meds:  crestor 10mg , not resumed in hospital LDL 130, goal < 70 Increase crestor to 40 mg Continue statin at discharge  Diabetes type II Controlled Home meds:  metformin HgbA1c 6.8, goal < 7.0 CBGs SSI Close PCP follow up  Tobacco abuse Current heavy smoker Last cigarette was last Friday.  Smoking cessation  counseling provided Pt is willing to quit  Other Stroke Risk Factors ETOH use, alcohol level <10, advised to drink no more than 1 drink a day  Hospital day # 5  Neurology will sign off. Please call with questions. Pt will follow up with stroke clinic NP at [facility] in about 4 weeks. Thanks for the consult.   Rosalin Hawking, MD PhD Stroke Neurology 10/02/2020 3:05 PM   To contact Stroke Continuity provider, please refer to [email or web address] After hours, contact General Neurology

## 2020-10-02 NOTE — Progress Notes (Addendum)
Progress Note  Patient Name: Tracey Meyer Date of Encounter: 10/02/2020  [facility] Cardiologist: Peter Martinique, MD   Subjective   Feeling okay this morning. A little heartburn but no chest pain. Breathing is stable, no palpitations. Looking forward to transitioning to CIR in the coming days.  Inpatient Medications    Scheduled Meds:  Continuous Infusions:  PRN Meds:    Vital Signs    Vitals:   10/02/20 0726 10/02/20 0834 10/02/20 0846 10/02/20 0905  BP: (!) 154/85 (!) 124/55 (!) 147/70   Pulse: 82 73 75   Resp: 20 (!) 24 17   Temp: (!) 97.3 F (36.3 C) 98.8 F (37.1 C)  (!) 97.5 F (36.4 C)  TempSrc: Temporal Axillary  Oral  SpO2: 100% 100% 99%   Weight:      Height:        Intake/Output Summary (Last 24 hours) at 10/02/2020 0921 Last data filed at 10/02/2020 0300 Gross per 24 hour  Intake --  Output 275 ml  Net -275 ml   Last 3 Weights 10/02/2020 10/01/2020 09/30/2020  Weight (lbs) 154 lb 8.7 oz 152 lb 1.9 oz 153 lb 3.5 oz  Weight (kg) 70.1 kg 69 kg 69.5 kg      Telemetry    Sinus rhythm - Personally Reviewed  ECG    No new tracings - Personally Reviewed  Physical Exam   GEN: No acute distress.   Neck: No JVD Cardiac: RRR, no murmurs, rubs, or gallops.  Respiratory: Clear to auscultation bilaterally. GI: Soft, nontender, non-distended  MS: No edema; No deformity. Neuro:  left sided paralysis  Psych: Normal affect   Labs    High Sensitivity Troponin:   Recent Labs  Lab 09/22/20 1233 09/27/20 1900 09/27/20 2116  TROPONINIHS >24,000* 15,211* 13,694*      Chemistry Recent Labs  Lab 09/27/20 1900 09/27/20 1950 09/29/20 0912  NA 134* 138 135  K 3.8 3.8 4.1  CL 104 108 104  CO2 18*  --  22  GLUCOSE 205* 201* 145*  BUN 26* 26* 24*  CREATININE 1.30* 1.20* 1.10*  CALCIUM 9.7  --  9.6  PROT 6.8  --   --   ALBUMIN 3.0*  --   --   AST 28  --   --   ALT 26  --   --   ALKPHOS 47  --   --   BILITOT 0.5  --   --   GFRNONAA 47*   --  58*  ANIONGAP 12  --  9     Hematology Recent Labs  Lab 09/27/20 1900 09/27/20 1950  WBC 6.9  --   RBC 3.86*  --   HGB 12.1 12.2  HCT 36.4 36.0  MCV 94.3  --   MCH 31.3  --   MCHC 33.2  --   RDW 13.1  --   PLT 352  --     BNP Recent Labs  Lab 09/27/20 1916  BNP 724.0*     DDimer No results for input(s): DDIMER in the last 168 hours.   Radiology    CT ANGIO HEAD NECK W WO CM  Result Date: 10/01/2020 CLINICAL DATA:  Follow-up stroke. Left-sided weakness. 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 EXAM: CT ANGIOGRAPHY HEAD AND NECK TECHNIQUE: Multidetector CT imaging of the head and neck was performed using the standard protocol during bolus administration of intravenous contrast. Multiplanar CT image reconstructions and  MIPs were obtained to evaluate the vascular anatomy. Carotid stenosis measurements (when applicable) are obtained utilizing NASCET criteria, using the distal internal carotid diameter as the denominator. CONTRAST:  44mL OMNIPAQUE IOHEXOL 350 MG/ML SOLN COMPARISON:  MRI studies 09/28/2020 FINDINGS: CT HEAD FINDINGS Brain: Acute infarction at the pontomedullary junction shown by MRI is difficult to appreciate by CT. No focal cerebellar finding. Low-density in the right occipital lobe consistent with the 2 cm acute infarction in that region. Other punctate acute infarctions scattered within both hemispheres cannot be specifically appreciated by CT. Chronic small-vessel ischemic changes are present throughout the cerebral hemispheric white matter. No hemorrhage, hydrocephalus or extra-axial collection. Vascular: There is atherosclerotic calcification of the major vessels at the base of the brain. Skull: Negative Sinuses: Clear/normal Orbits: Normal Review of the MIP images confirms the above findings CTA NECK FINDINGS Aortic arch: No aortic atherosclerotic calcification is seen. No dissection.  Branching pattern is normal without origin stenosis. Right carotid system: Common carotid artery widely patent to the bifurcation. There is calcified plaque at the carotid bifurcation but without stenosis. Left carotid system: Common carotid artery widely patent to the bifurcation. There is calcified plaque at the carotid bifurcation and ICA bulb but no stenosis. Vertebral arteries: Both vertebral artery origins are widely patent. Both vertebral arteries appear normal through the cervical region to the foramen magnum. Skeleton: Minimal cervical spondylosis. Other neck: No mass or lymphadenopathy. Upper chest: Normal Review of the MIP images confirms the above findings CTA HEAD FINDINGS Anterior circulation: Both internal carotid arteries are patent through the skull base and siphon regions. The anterior and middle cerebral vessels are patent. No large vessel occlusion or correctable proximal stenosis. No aneurysm or vascular malformation. Posterior circulation: Left vertebral artery is dominant and is widely patent through the foramen magnum. Mild narrowing in the distal V4 segment, but the vessel is patent to the basilar. Right vertebral artery either terminates in PICA or is occluded beyond PICA. Proximal basilar artery shows mild atherosclerotic irregularity. Superior cerebellar and posterior cerebral arteries are patent. Venous sinuses: Patent and normal. Anatomic variants: None significant otherwise. Review of the MIP images confirms the above findings IMPRESSION: The aorta has a normal appearance. Mild atherosclerotic plaque at both carotid bifurcations but without stenosis on either side. No evidence of ulceration or pronounced irregularity. Mild atherosclerotic change at the carotid siphons but without stenosis. No intracranial large or medium vessel occlusion in the anterior circulation. Right vertebral artery either terminates in PICA or is occluded distal to PICA. Left vertebral artery shows a moderate  stenosis of the distal V4 segment but does remain patent to the basilar. Mild atherosclerotic irregularity of the proximal basilar artery. Electronically Signed   By: Nelson Chimes M.D.   On: 10/01/2020 19:34   DG Swallowing Func-Speech Pathology  Result Date: 10/01/2020 Formatting of this result is different from the original. Objective Swallowing Evaluation: Type of Study: MBS-Modified Barium Swallow Study  Patient Details Name: BRENN GATTON MRN: [id number] Date of Birth: June 09, 1960 Today's Date: 10/01/2020 Time: SLP Start Time (ACUTE ONLY): 1159 -SLP Stop Time (ACUTE ONLY): 1212 SLP Time Calculation (min) (ACUTE ONLY): 13 min Past Medical History: Past Medical History: Diagnosis Date  Cardiomyopathy, ischemic 09/24/2020  DM (diabetes mellitus), type 2 ([place]) 09/24/2020  HTN (hypertension)   Hyperlipidemia   Prediabetes   S/P angioplasty with stent to mLAD 09/22/20  09/24/2020  Tobacco abuse 09/24/2020  Transaminitis 09/24/2020 Past Surgical History: Past Surgical History: Procedure Laterality Date  BREAST SURGERY Left  age 30  CORONARY/GRAFT ACUTE MI REVASCULARIZATION N/A 09/22/2020  Procedure: Coronary/Graft Acute MI Revascularization;  Surgeon: Martinique, Peter M, MD;  Location: [place] [facility];  Service: Cardiovascular;  Laterality: N/A;  LEFT HEART CATH AND CORONARY ANGIOGRAPHY N/A 09/22/2020  Procedure: LEFT HEART CATH AND CORONARY ANGIOGRAPHY;  Surgeon: Martinique, Peter M, MD;  Location: [place];  Service: Cardiovascular;  Laterality: N/A; HPI: Pt is a 60 y.o. female who presented with L sided weakness, slurred speech, orthopnea,  and SOB. MRI brain 7/8: Multiple scattered acute to early subacute ischemic nonhemorrhagic infarcts involving the bilateral cerebral and cerebellar hemispheres as above, with largest area of infarction measuring up to 2 cm at the right occipital cortex. TPA was not given. 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 Pt passed the [place] swallow screen on 7/7 and  7/8 SLP consulted for swallowing on 7/10 due to "some difficulty swallowing and getting SOB when eating" as noted by referring MD. PMH: DM2, tobacco use and trying to quit, HLD, CAD s/p angio and stenting of mLAD 7/4.  Subjective: pt feels like she's coughing with PO intake, getting SOB Assessment / Plan / Recommendation CHL IP CLINICAL IMPRESSIONS 10/01/2020 Clinical Impression Pt presents with functional oropharyngeal swallowing with good timing and efficiency so that she does not have any residuals or penetration/aspiration. She did have one moment of strong coughing that occurred shortly after swallowing, and she did acknowledge that this is what has been happening during meals, but there was no clear source for this coughing based on oropharyngeal function. Recommend continuing with regular solids and thin liquids as tolerated. SLP will f/u briefly given subjective symptoms. SLP Visit Diagnosis Dysphagia, unspecified (R13.10) Attention and concentration deficit following -- Frontal lobe and executive function deficit following -- Impact on safety and function No limitations   CHL IP TREATMENT RECOMMENDATION 10/01/2020 Treatment Recommendations Therapy as outlined in treatment plan below   Prognosis 10/01/2020 Prognosis for Safe Diet Advancement Good Barriers to Reach Goals -- Barriers/Prognosis Comment -- CHL IP DIET RECOMMENDATION 10/01/2020 SLP Diet Recommendations Regular solids;Thin liquid Liquid Administration via Cup;Straw Medication Administration Whole meds with liquid Compensations Slow rate;Small sips/bites Postural Changes Seated upright at 90 degrees   CHL IP OTHER RECOMMENDATIONS 10/01/2020 Recommended Consults -- Oral Care Recommendations Oral care BID Other Recommendations --   CHL IP FOLLOW UP RECOMMENDATIONS 10/01/2020 Follow up Recommendations Inpatient Rehab   CHL IP FREQUENCY AND DURATION 10/01/2020 Speech Therapy Frequency (ACUTE ONLY) min 1 x/week Treatment Duration 1 week      CHL IP ORAL PHASE  10/01/2020 Oral Phase WFL Oral - Pudding Teaspoon -- Oral - Pudding Cup -- Oral - Honey Teaspoon -- Oral - Honey Cup -- Oral - Nectar Teaspoon -- Oral - Nectar Cup -- Oral - Nectar Straw -- Oral - Thin Teaspoon -- Oral - Thin Cup -- Oral - Thin Straw -- Oral - Puree -- Oral - Mech Soft -- Oral - Regular -- Oral - Multi-Consistency -- Oral - Pill -- Oral Phase - Comment --  CHL IP PHARYNGEAL PHASE 10/01/2020 Pharyngeal Phase WFL Pharyngeal- Pudding Teaspoon -- Pharyngeal -- Pharyngeal- Pudding Cup -- Pharyngeal -- Pharyngeal- Honey Teaspoon -- Pharyngeal -- Pharyngeal- Honey Cup -- Pharyngeal -- Pharyngeal- Nectar Teaspoon -- Pharyngeal -- Pharyngeal- Nectar Cup -- Pharyngeal -- Pharyngeal- Nectar Straw -- Pharyngeal -- Pharyngeal- Thin Teaspoon -- Pharyngeal -- Pharyngeal- Thin Cup -- Pharyngeal -- Pharyngeal- Thin Straw -- Pharyngeal -- Pharyngeal- Puree -- Pharyngeal -- Pharyngeal- Mechanical  Soft -- Pharyngeal -- Pharyngeal- Regular -- Pharyngeal -- Pharyngeal- Multi-consistency -- Pharyngeal -- Pharyngeal- Pill -- Pharyngeal -- Pharyngeal Comment --  CHL IP CERVICAL ESOPHAGEAL PHASE 10/01/2020 Cervical Esophageal Phase WFL Pudding Teaspoon -- Pudding Cup -- Honey Teaspoon -- Honey Cup -- Nectar Teaspoon -- Nectar Cup -- Nectar Straw -- Thin Teaspoon -- Thin Cup -- Thin Straw -- Puree -- Mechanical Soft -- Regular -- Multi-consistency -- Pill -- Cervical Esophageal Comment -- Osie Bond., M.A. [facility] Pager [phone] Office [phone] 10/01/2020, 1:53 PM               Cardiac Studies   Echo 09/22/20:  1. Apex well-visualized without contrast - false tendon (normal variant)  - no thrombus. Left ventricular ejection fraction, by estimation, is 35 to  40%. The left ventricle has moderately decreased function. The left  ventricle demonstrates regional wall  motion abnormalities (see scoring diagram/findings for description). There  is mild left ventricular hypertrophy. Left  ventricular diastolic  parameters are consistent with Grade I diastolic dysfunction (impaired  relaxation). Elevated left ventricular  end-diastolic pressure. There is severe hypokinesis of the left  ventricular, entire anterior wall, anteroseptal wall, apical segment and  inferoapical segment. Findings suggest LAD territory ischemia/infarct.   2. Right ventricular systolic function is normal. The right ventricular  size is normal. There is normal pulmonary artery systolic pressure.   3. The mitral valve is abnormal. Mild mitral valve regurgitation.   4. The aortic valve is tricuspid. Aortic valve regurgitation is not  visualized.   5. The inferior vena cava is normal in size with <50% respiratory  variability, suggesting right atrial pressure of 8 mmHg.   LHC 09/22/20: RPDA lesion is 50% stenosed. Dist Cx lesion is 30% stenosed with 30% stenosed side branch in LPAV. 1st Diag lesion is 60% stenosed. Mid LAD lesion is 100% stenosed. Post intervention, there is a 0% residual stenosis. A drug-eluting stent was successfully placed using a STENT ONYX FRONTIER [id number]. LV end diastolic pressure is mildly elevated.   1. 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 Mildly elevated LVEDP 21 mmHg 3. Successful PCI of the mid LAD with DES x 1   Diagnostic Dominance: Co-dominant      Intervention          Echocardiogram 09/28/20: 1. Left ventricular ejection fraction, by estimation, is 30 to 35%. The  left ventricle has moderately decreased function. The left ventricle  demonstrates regional wall motion abnormalities (see scoring  diagram/findings for description). There is mild  left ventricular hypertrophy. Left ventricular diastolic parameters are  consistent with Grade II diastolic dysfunction (pseudonormalization).  Elevated left atrial pressure.   2. No LV thrombus seen   3. Right ventricular systolic function is normal. The right ventricular  size is normal.  Tricuspid regurgitation signal is inadequate for assessing  PA pressure.   4. The mitral valve is normal in structure. Trivial mitral valve  regurgitation.   5. The aortic valve was not well visualized. Aortic valve regurgitation  is not visualized. No aortic stenosis is present.   6. The inferior vena cava is normal in size with greater than 50%  respiratory variability, suggesting right atrial pressure of 3 mmHg.  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  Patient  Profile     60 y.o. female  with a PMH of recent STEMI s/p PCI/DES to LAD 09/21/20, ICM/Chronic combined CHF, HTN, HLD, DM type 2, who presented with left sided weakness, found to have an acute CVA.   Assessment & Plan    1. Acute CVA: patient presented with left sided weakness which she reports starting 09/24/20 after returning home from a hospital stay for STEMI earlier that day. Found to have acute/subacute non-hemorrhagic strokes on MRI. MRA neck showed 70% L ICA stenosis for which outpatient vascular surgery follow-up is recommended. Neurology following and recommending permissive HTN. Suspicions high for cardioembolic source. Echo this admission without LV thrombus. TEE also without LV thrombus. - Planning for discharge to CIR - Continue aspirin and brilinta - Continue statin   2. Recent STEMI: patient presented with anterior STEMI-delayed presentation 09/21/20 and underwent LHC with PCI/DES to LAD. She had an ICM with EF 35-40% with anterior hypokinesis but no apical thrombus at that time. - Continue aspirin and brilinta - Continue statin   3. ICM/Chronic combined CHF: Echo 09/22/20 at the time of her STEMI showed EF 35-40% with severe hypokinesis of the anterior  wall, G1DD, mild LVH, and mild MR. She was recently started on carvedilol, entresto, and spironolactone. Patient reported orthopnea and SOB on this admission. BNP 724, up from 600s 1 week ago. CXR clear lungs with no acute findings. Echo this admission with EF 30-35%, G2DD, ongoing WMA, and no evidence of LV thrombus. Home BP meds on hold for permissive hypertension in the setting of #1. She appears euvolemic on exam today. Per neurology, "Given significance in posterior circulation vascular stenosis, recommend long-term BP goal 130-150." - At this point will restart Bblocker - will transition to metoprolol succinate to minimize hypotension (previously on carvedilol) - Hopeful to restart low dose entresto if room in BP - Monitor volume status closely with daily weights and strict I&Os   4. HLD: LDL 130 this admission; recently started on low dose crestor 1 week ago. LFTs normal this admission - Agree with increasing crestor to 40mg  daily - Will need repeat FLP/LFTs in 6-8 weeks  5. Carotid artery stenosis: found to have 70% left ICA stenosis on imaging this admission. Seen by Vascular surgery yesterday who states this is unlikely to be the etiology of her stroke given bilateral infarcts in both cerebellar and cerebral regions.  - Planning for outpatient follow-up with Vascular Surgery with repeat carotid dopplers in 3 months.  - Continue aspirin and statin       For questions or updates, please contact [facility] Please consult [email or web address] for contact info under        Signed, Abigail Butts, PA-C  10/02/2020, 9:21 AM    History and all data above reviewed.  Patient examined.  I agree with the findings as above.  She is having left arm pain with movement of it but not anginal pain.  No SOB. The patient exam reveals COR:RRR  ,  Lungs: Clear  ,  Abd: Positive bowel sounds, no rebound no guarding, Ext No edema  .  All available labs, radiology testing, previous records reviewed. Agree  with documented assessment and plan.   Ischemic cardiomyopathy:  Walking a line between treating the reduced EF and not allowing the BP too low.  We will follow for med titration in CIR.    Jeneen Rinks Halimah Bewick  12:04 PM  10/02/2020

## 2020-10-02 NOTE — Anesthesia Postprocedure Evaluation (Signed)
Anesthesia Post Note  Patient: Tracey Meyer  Procedure(s) Performed: TRANSESOPHAGEAL ECHOCARDIOGRAM (TEE) BUBBLE STUDY     Patient location during evaluation: PACU Anesthesia Type: General Level of consciousness: sedated Pain management: pain level controlled Vital Signs Assessment: post-procedure vital signs reviewed and stable Respiratory status: spontaneous breathing and respiratory function stable Cardiovascular status: stable Postop Assessment: no apparent nausea or vomiting Anesthetic complications: no   No notable events documented.  Last Vitals:  Vitals:   10/02/20 0846 10/02/20 0905  BP: (!) 147/70   Pulse: 75   Resp: 17   Temp:  (!) 36.4 C  SpO2: 99%     Last Pain:  Vitals:   10/02/20 0905  TempSrc: Oral  PainSc: 0-No pain                 Calandra Madura DANIEL

## 2020-10-02 NOTE — Progress Notes (Signed)
Inpatient Rehab Admissions Coordinator:   I spoke with Pt. This AM about potential help at d/c. She states her sisters have caregiving responsibilities already and live in [place], her friend Arville Go is married and cannot come stay with her. I spoke with her fiance, [name], yesterday, and he states that he is not able to provide the physical assist Pt. Will likely need, as he is disabled himself. CIR will sign off at this time. Pt. Will likely need SNF. I have notified TOC.  Clemens Catholic, [place], [place] Admissions Coordinator  [phone] ([place]) [phone] (office)

## 2020-10-02 NOTE — Progress Notes (Signed)
    Transesophageal Echocardiogram Note  Tracey Meyer [id number] 19-Jan-1961  Procedure: Transesophageal Echocardiogram Indications: CVA  Procedure Details Consent: Obtained Time Out: Verified patient identification, verified procedure, site/side was marked, verified correct patient position, special equipment/implants available, Radiology Safety Procedures followed,  medications/allergies/relevent history reviewed, required imaging and test results available.  Performed  Medications:  Pt sedated by anesthesia with lidocaine 70 mg and diprovan 216 mg IV.  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     Complications: No apparent complications Patient did tolerate procedure well.  Kirk Ruths, MD

## 2020-10-02 NOTE — Progress Notes (Signed)
Physical Therapy Treatment Patient Details Name: Tracey Meyer MRN: [id number] DOB: Jan 13, 1961 Today's Date: 10/02/2020    History of Present Illness 60 yo female presents to [facility] on 7/7 with L weakness, shortness of breath that started on 7/4. 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 CTH specifically shows focus of peripheral hypoattenuation in posterior R parietooccipital lobe. PMH significant for DM2, HTN, ischemic cardiomyopathy EF 35-40%, tobacco use and trying to quit, HLD, breast cancer with L radical mastectomy, CAD s/p angio and stenting of mLAD d/c 7/3.    PT Comments    Pt received in bed, agreeable to participation in therapy. Pt underwent TEE this AM. She required min assist supine to sit, mod assist sit to stand, and total assist using stedy for bed to recliner transfer. Pt in recliner with feet elevated at end of session. Pillows placed for support of LUE.    Follow Up Recommendations  CIR     Equipment Recommendations  Other (comment) (defer to post acute)    Recommendations for Other Services Rehab consult     Precautions / Restrictions Precautions Precautions: Fall Precaution Comments: L hemiplegia    Mobility  Bed Mobility Overal bed mobility: Needs Assistance Bed Mobility: Supine to Sit     Supine to sit: Min assist;HOB elevated     General bed mobility comments: +rail, increased time, assist with LLE and to elevate trunk, use of bed pad to scoot to EOB    Transfers Overall transfer level: Needs assistance   Transfers: Sit to/from [employer] Transfers Sit to Stand: Mod assist Stand pivot transfers: Total assist       General transfer comment: mod assist to power up to stand. Use of stedy to block L knee. Total assist bed to recliner transfer using stedy. Assist to maintain grip L hand on stedy.  Ambulation/Gait                 Stairs              Wheelchair Mobility    Modified Rankin (Stroke Patients Only) Modified Rankin (Stroke Patients Only) Pre-Morbid Rankin Score: No symptoms Modified Rankin: Severe disability     Balance Overall balance assessment: Needs assistance Sitting-balance support: Feet supported;Single extremity supported Sitting balance-Leahy Scale: Fair     Standing balance support: Single extremity supported;Bilateral upper extremity supported;During functional activity Standing balance-Leahy Scale: Poor Standing balance comment: reliant on external support                            Cognition Arousal/Alertness: Awake/alert Behavior During Therapy: WFL for tasks assessed/performed Overall Cognitive Status: Impaired/Different from baseline Area of Impairment: Following commands;Safety/judgement;Problem solving                       Following Commands: Follows one step commands consistently Safety/Judgement: Decreased awareness of safety   Problem Solving: Difficulty sequencing;Requires verbal cues;Requires tactile cues        Exercises      General Comments General comments (skin integrity, edema, etc.): VSS      Pertinent Vitals/Pain Pain Assessment: No/denies pain    Home Living                      Prior Function            PT Goals (current goals can now be found in  the care plan section) Acute Rehab PT Goals Patient Stated Goal: not stated Progress towards PT goals: Progressing toward goals    Frequency    Min 4X/week      PT Plan Current plan remains appropriate    Co-evaluation              AM-PAC PT "6 Clicks" Mobility   Outcome Measure  Help needed turning from your back to your side while in a flat bed without using bedrails?: A Little Help needed moving from lying on your back to sitting on the side of a flat bed without using bedrails?: A Little Help needed moving to and from a bed to a chair (including a  wheelchair)?: A Lot Help needed standing up from a chair using your arms (e.g., wheelchair or bedside chair)?: A Lot Help needed to walk in hospital room?: Total Help needed climbing 3-5 steps with a railing? : Total 6 Click Score: 12    End of Session Equipment Utilized During Treatment: Gait belt Activity Tolerance: Patient tolerated treatment well Patient left: in chair;with call bell/phone within reach;with chair alarm set Nurse Communication: Mobility status;Need for lift equipment PT Visit Diagnosis: Other abnormalities of gait and mobility (R26.89);Hemiplegia and hemiparesis Hemiplegia - Right/Left: Left Hemiplegia - dominant/non-dominant: Non-dominant Hemiplegia - caused by: Cerebral infarction     Time: 1215-1227 PT Time Calculation (min) (ACUTE ONLY): 12 min  Charges:  $Therapeutic Activity: 8-22 mins                     Lorrin Goodell, PT  Office # [phone] Pager [phone]    Lorriane Shire 10/02/2020, 1:02 PM

## 2020-10-02 NOTE — Anesthesia Procedure Notes (Signed)
Procedure Name: MAC Date/Time: 10/02/2020 8:09 AM Performed by: Dorthea [place], CRNA Pre-anesthesia Checklist: Emergency Drugs available, Patient identified, Suction available, Patient being monitored and Timeout performed Patient Re-evaluated:Patient Re-evaluated prior to induction Oxygen Delivery Method: Nasal cannula Preoxygenation: Pre-oxygenation with 100% oxygen Induction Type: IV induction Placement Confirmation: positive ETCO2 and CO2 detector Dental Injury: Teeth and Oropharynx as per pre-operative assessment

## 2020-10-02 NOTE — Transfer of Care (Signed)
Immediate Anesthesia Transfer of Care Note  Patient: Tracey Meyer  Procedure(s) Performed: TRANSESOPHAGEAL ECHOCARDIOGRAM (TEE) BUBBLE STUDY  Patient Location: Endoscopy Unit  Anesthesia Type:MAC  Level of Consciousness: awake, alert  and oriented  Airway & Oxygen Therapy: Patient Spontanous Breathing and Patient connected to nasal cannula oxygen  Post-op Assessment: Report given to RN and Post -op Vital signs reviewed and stable  Post vital signs: Reviewed and stable  Last Vitals:  Vitals Value Taken Time  BP 124/55 10/02/20 0834  Temp 37.1 C 10/02/20 0834  Pulse 80 10/02/20 0838  Resp 29 10/02/20 0838  SpO2 100 % 10/02/20 0838  Vitals shown include unvalidated device data.  Last Pain:  Vitals:   10/02/20 0834  TempSrc: Axillary  PainSc: 0-No pain      Patients Stated Pain Goal: 0 (20/35/59 7416)  Complications: No notable events documented.

## 2020-10-02 NOTE — Progress Notes (Signed)
  Echocardiogram Echocardiogram Transesophageal with 3D, color and doppler has been performed.  Darlina Sicilian M 10/02/2020, 9:03 AM

## 2020-10-03 LAB — COMPREHENSIVE METABOLIC PANEL
ALT: 15 U/L (ref 0–44)
AST: 16 U/L (ref 15–41)
Albumin: 2.7 g/dL — ABNORMAL LOW (ref 3.5–5.0)
Alkaline Phosphatase: 55 U/L (ref 38–126)
Anion gap: 8 (ref 5–15)
BUN: 18 mg/dL (ref 6–20)
CO2: 21 mmol/L — ABNORMAL LOW (ref 22–32)
Calcium: 9.4 mg/dL (ref 8.9–10.3)
Chloride: 104 mmol/L (ref 98–111)
Creatinine, Ser: 1.1 mg/dL — ABNORMAL HIGH (ref 0.44–1.00)
GFR, Estimated: 58 mL/min — ABNORMAL LOW (ref >60)
Glucose, Bld: 140 mg/dL — ABNORMAL HIGH (ref 70–99)
Potassium: 4 mmol/L (ref 3.5–5.1)
Sodium: 133 mmol/L — ABNORMAL LOW (ref 135–145)
Total Bilirubin: 0.6 mg/dL (ref 0.3–1.2)
Total Protein: 7 g/dL (ref 6.5–8.1)

## 2020-10-03 LAB — GLUCOSE, CAPILLARY
Glucose-Capillary: 87 mg/dL (ref 70–99)
Glucose-Capillary: 175 mg/dL — ABNORMAL HIGH (ref 70–99)
Glucose-Capillary: 142 mg/dL — ABNORMAL HIGH (ref 70–99)
Glucose-Capillary: 160 mg/dL — ABNORMAL HIGH (ref 70–99)

## 2020-10-03 LAB — CBC WITH DIFFERENTIAL/PLATELET
Abs Immature Granulocytes: 0.03 10*3/uL (ref 0.00–0.07)
Basophils Absolute: 0 10*3/uL (ref 0.0–0.1)
Basophils Relative: 0 %
Eosinophils Absolute: 0.2 10*3/uL (ref 0.0–0.5)
Eosinophils Relative: 2 %
HCT: 36 % (ref 36.0–46.0)
Hemoglobin: 11.8 g/dL — ABNORMAL LOW (ref 12.0–15.0)
Immature Granulocytes: 0 %
Lymphocytes Relative: 9 %
Lymphs Abs: 0.9 10*3/uL (ref 0.7–4.0)
MCH: 31.1 pg (ref 26.0–34.0)
MCHC: 32.8 g/dL (ref 30.0–36.0)
MCV: 94.7 fL (ref 80.0–100.0)
Monocytes Absolute: 0.4 10*3/uL (ref 0.1–1.0)
Monocytes Relative: 5 %
Neutro Abs: 8 10*3/uL — ABNORMAL HIGH (ref 1.7–7.7)
Neutrophils Relative %: 84 %
Platelets: 420 10*3/uL — ABNORMAL HIGH (ref 150–400)
RBC: 3.8 MIL/uL — ABNORMAL LOW (ref 3.87–5.11)
RDW: 12.8 % (ref 11.5–15.5)
WBC: 9.6 10*3/uL (ref 4.0–10.5)
nRBC: 0 % (ref 0.0–0.2)

## 2020-10-03 NOTE — TOC Initial Note (Addendum)
Transition of Care [facility]) - Initial/Assessment Note    Patient Details  Name: Tracey Meyer MRN: [id number] Date of Birth: [birth year]  Transition of Care [facility]) CM/SW Contact:    Trula Ore, [place] Phone Number: 10/03/2020, 5:24 PM  Clinical Narrative:                  CSW received consult for possible SNF placement at time of discharge. CSW spoke with patient at bedside regarding PT recommendation of SNF placement at time of discharge. Patient reported that patient's significant other is currently unable to care for patient at their home given patient's current physical needs and fall risk. Patient expressed understanding of PT recommendation and is agreeable to SNF placement at time of discharge. Patient confirmed that she has no insurance. Patient confirmed that she is unable to pay out of pocket for SNF. Patient reports that friend/paralegal Mechele Claude from [place] started [facility] application for her. Patient gave CSW permission to reach out to financial counseling to screen patient for medicaid. CSW discussed with patient barriers to placement. Patient understood. Patient gave CSW permission to fax out initial referral near the [place] area.. Patient has received the COVID vaccines as well as two boosters. No further questions reported at this time. CSW informed TOC leadership to inform them of patients barriers to placement. CSW reached out to [place] with financial counseling to screen patient for medicaid. CSW to continue to follow and assist with discharge planning needs.   Expected Discharge Plan: Skilled Nursing Facility Barriers to Discharge: Continued Medical Work up   Patient Goals and CMS Choice Patient states their goals for this hospitalization and ongoing recovery are:: to go to SNF CMS Medicare.gov Compare Post Acute Care list provided to:: Patient Choice offered to / list presented to : Patient  Expected Discharge Plan and Services Expected Discharge Plan:  [place] In-house Referral: Clinical Social Work     Living arrangements for the past 2 months: [place]                                      Prior Living Arrangements/Services Living arrangements for the past 2 months: Single Family Home Lives with:: Self, Significant Other Patient language and need for interpreter reviewed:: Yes Do you feel safe going back to the place where you live?: No   SNF  Need for Family Participation in Patient Care: Yes (Comment) Care giver support system in place?: Yes (comment)   Criminal Activity/Legal Involvement Pertinent to Current Situation/Hospitalization: No - Comment as needed  Activities of Daily Living Home Assistive Devices/Equipment: Cane (specify quad or straight) (straight cane) ADL Screening (condition at time of admission) Patient's cognitive ability adequate to safely complete daily activities?: Yes Is the patient deaf or have difficulty hearing?: No Does the patient have difficulty seeing, even when wearing glasses/contacts?: No Does the patient have difficulty concentrating, remembering, or making decisions?: No Patient able to express need for assistance with ADLs?: Yes Does the patient have difficulty dressing or bathing?: No Independently performs ADLs?: Yes (appropriate for developmental age) Does the patient have difficulty walking or climbing stairs?: No Weakness of Legs: None Weakness of Arms/Hands: None  Permission Sought/Granted Permission sought to share information with : Case Manager, Family Supports, Customer service manager Permission granted to share information with : Yes, Verbal Permission Granted  Share Information with NAME: [name]  Permission granted to share info w  AGENCY: SNF  Permission granted to share info w Relationship: Significant Other  Permission granted to share info w Contact Information: [name] [phone]  Emotional Assessment Appearance:: Appears  stated age Attitude/Demeanor/Rapport: Gracious Affect (typically observed): Calm Orientation: : Oriented to Self, Oriented to Place, Oriented to  Time, Oriented to Situation Alcohol / Substance Use: Not Applicable Psych Involvement: No (comment)  Admission diagnosis:  Shortness of breath [R06.02] Acute ischemic stroke ([place]) [I63.9] Left-sided weakness [R53.1] Patient Active Problem List   Diagnosis Date Noted   CKD (chronic kidney disease), stage III ([place]) 09/29/2020   Left-sided weakness    Acute ischemic stroke ([place]) 09/27/2020   HFrEF (heart failure with reduced ejection fraction) ([place]) 09/27/2020   Tobacco abuse 09/24/2020   Cardiomyopathy, ischemic 09/24/2020   S/P angioplasty with stent to mLAD 09/22/20  09/24/2020   Transaminitis 09/24/2020   DM (diabetes mellitus), type 2 ([place]) 09/24/2020   Acute ST elevation myocardial infarction (STEMI) due to occlusion of left anterior descending (LAD) coronary artery ([place]) 09/22/2020   HTN (hypertension) 09/22/2020   Hyperlipidemia 09/22/2020   STEMI involving left anterior descending coronary artery ([place]) 09/22/2020   ST elevation myocardial infarction involving left anterior descending (LAD) coronary artery ([place]) 09/22/2020   PCP:  Pcp, No Pharmacy:   [id number] - [place], [address] [place] [address] Phone: [phone] Fax: [phone]     Social Determinants of Health (SDOH) Interventions    Readmission Risk Interventions No flowsheet data found.

## 2020-10-03 NOTE — Progress Notes (Addendum)
PROGRESS NOTE    Tracey Meyer  YTK:[id number] DOB: [birth year] DOA: 09/27/2020 PCP: Pcp, No (  Chief Complaint  Patient presents with   Code STEMI   Brief Narrative:  Tracey Meyer is Tracey Meyer 60 y/o F with CAD (recent stent to the LAD on 7/4), ischemic cardiomyopathy with EF of 35-40%, HTN, DM2, HLD and smoking who presented to the ED with shortness of breath and orthopnea. She also stated that on the evening of 7/4, she developed left sided weakness and slurred speech which did not improve.   In ED: CT head> 18 x 16 x 17 mm focus of peripheral hypo attenuation in the posterior right parietooccipital region. This is nonspecific and could be related to subacute infarct, but given the somewhat rounded configuration, MRI of the brain without and with contrast recommended to further evaluate.   Cardiology and neurology consulted. IV Lasix started and MRI/ MRA ordered. Admitted to treat acute CHF and work up neurological symptoms.  Assessment & Plan:   Principal Problem:   Acute ischemic stroke Tracey Meyer) Active Problems:   HTN (hypertension)   Hyperlipidemia   STEMI involving left anterior descending coronary artery (HCC)   Tobacco abuse   Cardiomyopathy, ischemic   S/P angioplasty with stent to mLAD 09/22/20    DM (diabetes mellitus), type 2 (Tracey Meyer)   HFrEF (heart failure with reduced ejection fraction) (HCC)   CKD (chronic kidney disease), stage III ([name])  Acute ischemic strokes  - per neurology, likely cardioembolic related to cardiomyopathy with low EF  - symptoms occurred on the night of 7/4 after she got home from the [facility] - MRI brain with multiple scattered acute to early subacute ischemic nonhemorrhagic infarcts involving bilateral cerebral and cerebellar hemispheres with largest area of infarction measuring up to 2 cm at R occipital cortex - central thromboembolic etiology suspected.  Underlying moderate chronic microvascular disease with Tracey Meyer few scattered remote lacunar  infarcts involving the L thalamus and pons. - MRA head technically limited due to motion artifact, negative for LVO.  Intracranial atherosclerotic disease with associated moderate stneoses involving the distal L V4 segment, distal basilar artery, and carvernous R ICA.  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 MRA neck limited due to motion artifact, 70% stenosis at origin of L ICA.  Otherwise negative MRA of neck. -LDL is 130-Crestor started after MI-  increased from 10 mg this admission to 40 mg daily -A1c is 6.8 -2D echo does not reveal Tracey Meyer thrombus   -TEE with no left atrial/L atrial appendage thrombus.  Positive intrapulmonary shunting. -Neurology recommending to continue 81 mg aspirin and Brilinta - SLP eval done- mild aspiration risk- regular diet with thin liquid recommended and MBS ordered - SLUMs is 17/30 - 7/11> - MBS completed and SLP recommending regular diet and thin liquids,   - CIR has signed off today as patient has no one to assist her at home- her husband states he is disabled and cannot help. I have consulted TOC for SNF- she will need Medicaid --- likely difficult to place  Left ICA stenosis - MRA>> Approximate 70% atheromatous stenosis at the origin of the left ICA- clearly not the cause of current CVAs - Spoke with vascular surgery. Dr Carlis Abbott to get an appt so she does not fall through the cracks - CTA today- no vascular stenosis- Neuro communicated with vascular   Chronic systolic heart failure (mild) CAD with NSTEMI and stent in mid LAD on 09/22/2020 -Echo 09/22/2020>  EF 35 to 40%, mild LVH, grade 1 diastolic dysfunction, severe hypokinesis of the LV entire anterior wall anteroseptal wall apical segment and inferior apical segment. -Echo 09/28/2020> EF 30 to 16%, grade 2 diastolic dysfunction -Given 20 mg of IV Lasix on 7/7 and no diuretics since - Continue aspirin, Brilinta, Crestor -Antihypertensives being held to allow for permissive  hypertension -Cardiology is following   Urinary retention - 7/8 Patient had the urge to urinate but was unable to-bladder scan revealed 700 cc- In-N-Out cath done and 600 cc of urine obtained  - no further I and O caths needed   Small amounts of pink discharge in bed pan - follow- much less today per RN   CKD 3B - Cr has mostly been ~ 1.0-1.2  in [facility] - Cr was 0.80 on 7/2- I have no labs prior to this     DM (diabetes mellitus), type 2 (Tracey Meyer) -On metformin at home- sugars < 150 here in the [facility] - she is eating most of her meals-    Tobacco abuse - Smoking cessation counseling given  DVT prophylaxis: lovenox Code Status: full  Family Communication: none at bedside Disposition:   Status is: Inpatient  Remains inpatient appropriate because:Inpatient level of care appropriate due to severity of illness  Dispo: The patient is from: Home              Anticipated d/c is to: Home              Patient currently is not medically stable to d/c.   Difficult to place patient No       Consultants:  Cardiology neurology  Procedures: Echo (TEE) IMPRESSIONS     1. Akinesis of the anteroseptal wall and apex with overall moderate to  severe LV dysfunction; no obvious apical thrombus noted using definity.   2. Left ventricular ejection fraction, by estimation, is 30 to 35%. The  left ventricle has moderate to severely decreased function. The left  ventricle demonstrates regional wall motion abnormalities (see scoring  diagram/findings for description). The  left ventricular internal cavity size was mildly dilated.   3. Right ventricular systolic function is normal. The right ventricular  size is normal.   4. Left atrial size was mildly dilated. No left atrial/left atrial  appendage thrombus was detected.   5. Tracey Meyer small pericardial effusion is present.   6. The mitral valve is normal in structure. Mild mitral valve  regurgitation.   7. The aortic valve is tricuspid.  Aortic valve regurgitation is not  visualized.   8. There is mild (Grade II) plaque involving the descending aorta.   9. Agitated saline contrast bubble study was positive with shunting  observed after >6 cardiac cycles suggestive of intrapulmonary shunting.   Echo IMPRESSIONS     1. Left ventricular ejection fraction, by estimation, is 30 to 35%. The  left ventricle has moderately decreased function. The left ventricle  demonstrates regional wall motion abnormalities (see scoring  diagram/findings for description). There is mild  left ventricular hypertrophy. Left ventricular diastolic parameters are  consistent with Grade II diastolic dysfunction (pseudonormalization).  Elevated left atrial pressure.   2. No LV thrombus seen   3. Right ventricular systolic function is normal. The right ventricular  size is normal. Tricuspid regurgitation signal is inadequate for assessing  PA pressure.   4. The mitral valve is normal in structure. Trivial mitral valve  regurgitation.   5. The aortic valve was not well visualized. Aortic valve regurgitation  is not visualized. No aortic stenosis is present.   6. The inferior vena cava is normal in size with greater than 50%  respiratory variability, suggesting right atrial pressure of 3 mmHg.    Antimicrobials:  Anti-infectives (From admission, onward)    None        Subjective: No new complaints  Objective: Vitals:   10/02/20 1402 10/02/20 2028 10/03/20 0539 10/03/20 1131  BP: (!) 114/95 (!) 144/76  140/87  Pulse: 79 75  62  Resp: 17 18 16  (!) 23  Temp: 97.6 F (36.4 C) 98.1 F (36.7 C) 97.7 F (36.5 C) 97.7 F (36.5 C)  TempSrc: Oral Oral Oral Oral  SpO2: 96% 100%  100%  Weight:      Height:       No intake or output data in the 24 hours ending 10/03/20 1804 Filed Weights   09/30/20 0438 10/01/20 0500 10/02/20 0326  Weight: 69.5 kg 69 kg 70.1 kg    Examination:  General exam: Appears calm and comfortable   Respiratory system: Clear to auscultation. Respiratory effort normal. Cardiovascular system: S1 & S2 heard, RRR.  Gastrointestinal system: Abdomen is nondistended, soft and nontender Central nervous system: Left sided weakness Extremities: no LEE Skin: No rashes, lesions or ulcers Psychiatry: Judgement and insight appear normal. Mood & affect appropriate.     Data Reviewed: I have personally reviewed following labs and imaging studies  CBC: Recent Labs  Lab 09/27/20 1900 09/27/20 1950 10/03/20 0821  WBC 6.9  --  9.6  NEUTROABS 4.9  --  8.0*  HGB 12.1 12.2 11.8*  HCT 36.4 36.0 36.0  MCV 94.3  --  94.7  PLT 352  --  420*    Basic Metabolic Panel: Recent Labs  Lab 09/27/20 1900 09/27/20 1950 09/29/20 0912 10/03/20 0821  NA 134* 138 135 133*  K 3.8 3.8 4.1 4.0  CL 104 108 104 104  CO2 18*  --  22 21*  GLUCOSE 205* 201* 145* 140*  BUN 26* 26* 24* 18  CREATININE 1.30* 1.20* 1.10* 1.10*  CALCIUM 9.7  --  9.6 9.4    GFR: Estimated Creatinine Clearance: 52.9 mL/min (Jaydalyn Demattia) (by C-G formula based on SCr of 1.1 mg/dL (H)).  Liver Function Tests: Recent Labs  Lab 09/27/20 1900 10/03/20 0821  AST 28 16  ALT 26 15  ALKPHOS 47 55  BILITOT 0.5 0.6  PROT 6.8 7.0  ALBUMIN 3.0* 2.7*    CBG: Recent Labs  Lab 10/02/20 1143 10/02/20 1601 10/03/20 0738 10/03/20 1101 10/03/20 1601  GLUCAP 101* 108* 87 175* 142*     Recent Results (from the past 240 hour(s))  Resp Panel by RT-PCR (Flu Davon Folta&B, Covid) Nasopharyngeal Swab     Status: None   Collection Time: 09/27/20  6:54 PM   Specimen: Nasopharyngeal Swab; Nasopharyngeal(NP) swabs in vial transport medium  Result Value Ref Range Status   SARS Coronavirus 2 by RT PCR NEGATIVE NEGATIVE Final    Comment: (NOTE) SARS-CoV-2 target nucleic acids are NOT DETECTED.  The SARS-CoV-2 RNA is generally detectable in upper respiratory specimens during the acute phase of infection. The lowest concentration of SARS-CoV-2 viral copies  this assay can detect is 138 copies/mL. Patricia Perales negative result does not preclude SARS-Cov-2 infection and should not be used as the sole basis for treatment or other patient management decisions. Crosley Stejskal negative result may occur with  improper specimen collection/handling, submission of specimen other than nasopharyngeal swab, presence of viral mutation(s) within the areas targeted by  this assay, and inadequate number of viral copies(<138 copies/mL). Kentrell Guettler negative result must be combined with clinical observations, patient history, and epidemiological information. The expected result is Negative.  Fact Sheet for Patients:  EntrepreneurPulse.com.au  Fact Sheet for Healthcare Providers:  IncredibleEmployment.be  This test is no t yet approved or cleared by the [place] FDA and  has been authorized for detection and/or diagnosis of SARS-CoV-2 by FDA under an Emergency Use Authorization (EUA). This EUA will remain  in effect (meaning this test can be used) for the duration of the COVID-19 declaration under Section 564(b)(1) of the Act, 21 U.S.C.section 360bbb-3(b)(1), unless the authorization is terminated  or revoked sooner.       Influenza Christal Lagerstrom by PCR NEGATIVE NEGATIVE Final   Influenza B by PCR NEGATIVE NEGATIVE Final    Comment: (NOTE) The Xpert Xpress SARS-CoV-2/FLU/RSV plus assay is intended as an aid in the diagnosis of influenza from Nasopharyngeal swab specimens and should not be used as Lucelia Lacey sole basis for treatment. Nasal washings and aspirates are unacceptable for Xpert Xpress SARS-CoV-2/FLU/RSV testing.  Fact Sheet for Patients: EntrepreneurPulse.com.au  Fact Sheet for Healthcare Providers: IncredibleEmployment.be  This test is not yet approved or cleared by the [place] FDA and has been authorized for detection and/or diagnosis of SARS-CoV-2 by FDA under an Emergency Use Authorization (EUA). This EUA  will remain in effect (meaning this test can be used) for the duration of the COVID-19 declaration under Section 564(b)(1) of the Act, 21 U.S.C. section 360bbb-3(b)(1), unless the authorization is terminated or revoked.  Performed at [facility], [place] [address] [place], [place] [address]          Radiology Studies: CT ANGIO HEAD NECK W WO CM  Result Date: 10/01/2020 CLINICAL DATA:  Follow-up stroke. Left-sided weakness. 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 EXAM: CT ANGIOGRAPHY HEAD AND NECK TECHNIQUE: Multidetector CT imaging of the head and neck was performed using the standard protocol during bolus administration of intravenous contrast. Multiplanar CT image reconstructions and MIPs were obtained to evaluate the vascular anatomy. Carotid stenosis measurements (when applicable) are obtained utilizing NASCET criteria, using the distal internal carotid diameter as the denominator. CONTRAST:  41mL OMNIPAQUE IOHEXOL 350 MG/ML SOLN COMPARISON:  MRI studies 09/28/2020 FINDINGS: CT HEAD FINDINGS Brain: Acute infarction at the pontomedullary junction shown by MRI is difficult to appreciate by CT. No focal cerebellar finding. Low-density in the right occipital lobe consistent with the 2 cm acute infarction in that region. Other punctate acute infarctions scattered within both hemispheres cannot be specifically appreciated by CT. Chronic small-vessel ischemic changes are present throughout the cerebral hemispheric white matter. No hemorrhage, hydrocephalus or extra-axial collection. Vascular: There is atherosclerotic calcification of the major vessels at the base of the brain. Skull: Negative Sinuses: Clear/normal Orbits: Normal Review of the MIP images confirms the above findings CTA NECK FINDINGS Aortic arch: No aortic atherosclerotic calcification is seen. No dissection. Branching pattern is normal without origin stenosis.  Right carotid system: Common carotid artery widely patent to the bifurcation. There is calcified plaque at the carotid bifurcation but without stenosis. Left carotid system: Common carotid artery widely patent to the bifurcation. There is calcified plaque at the carotid bifurcation and ICA bulb but no stenosis. Vertebral arteries: Both vertebral artery origins are widely patent. Both vertebral arteries appear normal through the cervical region to the foramen magnum. Skeleton: Minimal cervical spondylosis. Other neck: No mass or lymphadenopathy. Upper  chest: Normal Review of the MIP images confirms the above findings CTA HEAD FINDINGS Anterior circulation: Both internal carotid arteries are patent through the skull base and siphon regions. The anterior and middle cerebral vessels are patent. No large vessel occlusion or correctable proximal stenosis. No aneurysm or vascular malformation. Posterior circulation: Left vertebral artery is dominant and is widely patent through the foramen magnum. Mild narrowing in the distal V4 segment, but the vessel is patent to the basilar. Right vertebral artery either terminates in PICA or is occluded beyond PICA. Proximal basilar artery shows mild atherosclerotic irregularity. Superior cerebellar and posterior cerebral arteries are patent. Venous sinuses: Patent and normal. Anatomic variants: None significant otherwise. Review of the MIP images confirms the above findings IMPRESSION: The aorta has Jamelle Noy normal appearance. Mild atherosclerotic plaque at both carotid bifurcations but without stenosis on either side. No evidence of ulceration or pronounced irregularity. Mild atherosclerotic change at the carotid siphons but without stenosis. No intracranial large or medium vessel occlusion in the anterior circulation. Right vertebral artery either terminates in PICA or is occluded distal to PICA. Left vertebral artery shows Zsofia Prout moderate stenosis of the distal V4 segment but does remain  patent to the basilar. Mild atherosclerotic irregularity of the proximal basilar artery. Electronically Signed   By: Nelson Chimes M.D.   On: 10/01/2020 19:34   ECHO TEE  Result Date: 10/02/2020    TRANSESOPHOGEAL ECHO REPORT   Patient Name:   ALIZEE MAPLE Date of Exam: 10/02/2020 Medical Rec #:  [id number]          Height:       64.0 in Accession #:    [id number]         Weight:       154.5 lb Date of Birth:  [birth year]          BSA:          1.753 m Patient Age:    59 years           BP:           147/70 mmHg Patient Gender: F                  HR:           75 bpm. Exam Location:  Inpatient Procedure: Transesophageal Echo, 3D Echo, Cardiac Doppler, Color Doppler and            Strain Analysis Indications:    Cerebral Infarction, unspecified I63.9  History:        Patient has prior history of Echocardiogram examinations, most                 recent 09/28/2020. CHF; Risk Factors:Diabetes, Dyslipidemia and                 Current Smoker.  Sonographer:    Darlina Sicilian RDCS Referring Phys: [place]: After discussion of the risks and benefits of Calixto Pavel TEE, an informed consent was obtained from the patient. TEE procedure time was 11 minutes. The transesophogeal probe was passed without difficulty through the esophogus of the patient. Imaged were obtained with the patient in Koa Zoeller left lateral decubitus position. Sedation performed by different physician. The patient was monitored while under deep sedation. Image quality was good. The patient's vital signs; including heart rate, blood pressure, and oxygen saturation; remained stable throughout the procedure. The patient developed no complications during the procedure. IMPRESSIONS  1. Akinesis of the anteroseptal wall and apex  with overall moderate to severe LV dysfunction; no obvious apical thrombus noted using definity.  2. Left ventricular ejection fraction, by estimation, is 30 to 35%. The left ventricle has moderate to severely decreased  function. The left ventricle demonstrates regional wall motion abnormalities (see scoring diagram/findings for description). The left ventricular internal cavity size was mildly dilated.  3. Right ventricular systolic function is normal. The right ventricular size is normal.  4. Left atrial size was mildly dilated. No left atrial/left atrial appendage thrombus was detected.  5. Ataya Murdy small pericardial effusion is present.  6. The mitral valve is normal in structure. Mild mitral valve regurgitation.  7. The aortic valve is tricuspid. Aortic valve regurgitation is not visualized.  8. There is mild (Grade II) plaque involving the descending aorta.  9. Agitated saline contrast bubble study was positive with shunting observed after >6 cardiac cycles suggestive of intrapulmonary shunting. FINDINGS  Left Ventricle: Left ventricular ejection fraction, by estimation, is 30 to 35%. The left ventricle has moderate to severely decreased function. The left ventricle demonstrates regional wall motion abnormalities. The left ventricular internal cavity size was mildly dilated. Right Ventricle: The right ventricular size is normal. Right ventricular systolic function is normal. Left Atrium: Left atrial size was mildly dilated. No left atrial/left atrial appendage thrombus was detected. Right Atrium: Right atrial size was normal in size. Pericardium: Salvatore Shear small pericardial effusion is present. Mitral Valve: The mitral valve is normal in structure. Mild mitral valve regurgitation. Tricuspid Valve: The tricuspid valve is normal in structure. Tricuspid valve regurgitation is trivial. Aortic Valve: The aortic valve is tricuspid. Aortic valve regurgitation is not visualized. Pulmonic Valve: The pulmonic valve was normal in structure. Pulmonic valve regurgitation is not visualized. Aorta: The aortic root is normal in size and structure. There is mild (Grade II) plaque involving the descending aorta. IAS/Shunts: No atrial level shunt detected by  color flow Doppler. Agitated saline contrast bubble study was positive with shunting observed after >6 cardiac cycles suggestive of intrapulmonary shunting. Additional Comments: Akinesis of the anteroseptal wall and apex with overall moderate to severe LV dysfunction; no obvious apical thrombus noted using definity.   3D Volume EF LV 3D EDV:   115.45 ml LV 3D ESV:   76.35 ml  AORTA Ao Root diam: 2.50 cm Ao Asc diam:  2.70 cm MITRAL VALVE MV Area (plan): 5.03 cm Kirk Ruths MD Electronically signed by Kirk Ruths MD Signature Date/Time: 10/02/2020/10:16:20 AM    Final         Scheduled Meds:  aspirin EC  81 mg Oral Daily   enoxaparin (LOVENOX) injection  40 mg Subcutaneous Q24H   famotidine  20 mg Oral Daily   insulin aspart  0-5 Units Subcutaneous QHS   insulin aspart  0-9 Units Subcutaneous TID WC   metoprolol succinate  25 mg Oral Daily   rosuvastatin  40 mg Oral Daily   ticagrelor  90 mg Oral BID   Continuous Infusions:   LOS: 6 days    Time spent: over 30 min    Fayrene Helper, MD [facility]   To contact the attending provider between 7A-7P or the covering provider during after hours 7P-7A, please log into the web site [email or web address] and access using universal [place] password for that web site. If you do not have the password, please call the [facility] operator.  10/03/2020, 6:04 PM

## 2020-10-03 NOTE — [place] FL2 (Signed)
[place] MEDICAID FL2 LEVEL OF CARE SCREENING TOOL     IDENTIFICATION  Patient Name: Tracey Meyer Birthdate: [birth year] Sex: female Admission Date (Current Location): 09/27/2020  [facility] and [place] Number:  Herbalist and Address:  [facility], [place] [address] [place], [place] [address]      Provider Number: [id number]  Attending Physician Name and Address:  Elodia Florence., *  Relative Name and Phone Number:  [name] [phone]    Current Level of Care: Hospital Recommended Level of Care: [place] Prior Approval Number:    Date Approved/Denied:   PASRR Number: [id number]  Discharge Plan: SNF    Current Diagnoses: Patient Active Problem List   Diagnosis Date Noted   CKD (chronic kidney disease), stage III ([place]) 09/29/2020   Left-sided weakness    Acute ischemic stroke ([place]) 09/27/2020   HFrEF (heart failure with reduced ejection fraction) ([place]) 09/27/2020   Tobacco abuse 09/24/2020   Cardiomyopathy, ischemic 09/24/2020   S/P angioplasty with stent to mLAD 09/22/20  09/24/2020   Transaminitis 09/24/2020   DM (diabetes mellitus), type 2 ([place]) 09/24/2020   Acute ST elevation myocardial infarction (STEMI) due to occlusion of left anterior descending (LAD) coronary artery ([place]) 09/22/2020   HTN (hypertension) 09/22/2020   Hyperlipidemia 09/22/2020   STEMI involving left anterior descending coronary artery ([place]) 09/22/2020   ST elevation myocardial infarction involving left anterior descending (LAD) coronary artery ([place]) 09/22/2020    Orientation RESPIRATION BLADDER Height & Weight     Self, Time, Situation, Place  Normal Incontinent Weight: 154 lb 8.7 oz (70.1 kg) Height:  5\' 4"  (162.6 cm)  BEHAVIORAL SYMPTOMS/MOOD NEUROLOGICAL BOWEL NUTRITION STATUS      Continent Diet (Please see discharge summary)  AMBULATORY STATUS COMMUNICATION OF NEEDS Skin       Normal                       Personal  Care Assistance Level of Assistance  Bathing, Feeding, Dressing Bathing Assistance: Limited assistance Feeding assistance: Independent (able to feed self) Dressing Assistance: Limited assistance     Functional Limitations Info  Sight, Hearing, Speech Sight Info: Adequate Hearing Info: Adequate Speech Info: Adequate    SPECIAL CARE FACTORS FREQUENCY  PT (By licensed PT), OT (By licensed OT)     PT Frequency: 5x min weekly OT Frequency: 5x min weekly            Contractures Contractures Info: Not present    Additional Factors Info  Code Status, Allergies, Insulin Sliding Scale Code Status Info: FULL Allergies Info: No Known Allergies   Insulin Sliding Scale Info: insulin aspart (novoLOG) injection 0-5 Units daily at bedtime,insulin aspart (novoLOG) injection 0-9 Units 3 times daily with meals       Current Medications (10/03/2020):  This is the current hospital active medication list Current Facility-Administered Medications  Medication Dose Route Frequency Provider Last Rate Last Admin   acetaminophen (TYLENOL) tablet 650 mg  650 mg Oral Q4H PRN Debbe Odea, MD       Or   acetaminophen (TYLENOL) 160 MG/5ML solution 650 mg  650 mg Per Tube Q4H PRN Debbe Odea, MD       Or   acetaminophen (TYLENOL) suppository 650 mg  650 mg Rectal Q4H PRN Debbe Odea, MD       alum & mag hydroxide-simeth (MAALOX/MYLANTA) 200-200-20 MG/5ML suspension 30 mL  30 mL Oral Q6H PRN Debbe Odea, MD  aspirin EC tablet 81 mg  81 mg Oral Daily Debbe Odea, MD   81 mg at 10/03/20 0946   enoxaparin (LOVENOX) injection 40 mg  40 mg Subcutaneous Q24H Debbe Odea, MD   40 mg at 10/02/20 2127   famotidine (PEPCID) tablet 20 mg  20 mg Oral Daily Rizwan, Eunice Blase, MD   20 mg at 10/03/20 0946   insulin aspart (novoLOG) injection 0-5 Units  0-5 Units Subcutaneous QHS Rizwan, Saima, MD       insulin aspart (novoLOG) injection 0-9 Units  0-9 Units Subcutaneous TID WC Rizwan, Eunice Blase, MD   1 Units at  10/03/20 1655   metoprolol succinate (TOPROL-XL) 24 hr tablet 25 mg  25 mg Oral Daily Kroeger, Krista M., PA-C   25 mg at 10/03/20 0947   rosuvastatin (CRESTOR) tablet 40 mg  40 mg Oral Daily Debbe Odea, MD   40 mg at 10/03/20 0947   ticagrelor (BRILINTA) tablet 90 mg  90 mg Oral BID Debbe Odea, MD   90 mg at 10/03/20 2395     Discharge Medications: Please see discharge summary for a list of discharge medications.  Relevant Imaging Results:  Relevant Lab Results:   Additional Information [id number]  Trula Ore, LCSWA

## 2020-10-03 NOTE — Progress Notes (Addendum)
Progress Note  Patient Name: Tracey Meyer Date of Encounter: 10/03/2020  Primary Cardiologist: Peter Martinique, MD   Subjective   No CV complaints today. Getting ready to work with PT.   Inpatient Medications    Scheduled Meds:  aspirin EC  81 mg Oral Daily   enoxaparin (LOVENOX) injection  40 mg Subcutaneous Q24H   famotidine  20 mg Oral Daily   insulin aspart  0-5 Units Subcutaneous QHS   insulin aspart  0-9 Units Subcutaneous TID WC   metoprolol succinate  25 mg Oral Daily   rosuvastatin  40 mg Oral Daily   ticagrelor  90 mg Oral BID   Continuous Infusions:  PRN Meds: acetaminophen **OR** acetaminophen (TYLENOL) oral liquid 160 mg/5 mL **OR** acetaminophen, alum & mag hydroxide-simeth   Vital Signs    Vitals:   10/02/20 0905 10/02/20 1402 10/02/20 2028 10/03/20 0539  BP:  (!) 114/95 (!) 144/76   Pulse:  79 75   Resp:  17 18 16   Temp: (!) 97.5 F (36.4 C) 97.6 F (36.4 C) 98.1 F (36.7 C) 97.7 F (36.5 C)  TempSrc: Oral Oral Oral Oral  SpO2:  96% 100%   Weight:      Height:        Intake/Output Summary (Last 24 hours) at 10/03/2020 0754 Last data filed at 10/02/2020 1100 Gross per 24 hour  Intake 118 ml  Output --  Net 118 ml   Filed Weights   09/30/20 0438 10/01/20 0500 10/02/20 0326  Weight: 69.5 kg 69 kg 70.1 kg    Physical Exam   General: Well developed, well nourished, NAD Neck: Negative for carotid bruits. No JVD Lungs:Clear to ausculation bilaterally. No wheezes, rales, or rhonchi. Breathing is unlabored. Cardiovascular: RRR with S1 S2. No murmurs MSK: Strength decreased on LUE/LLE Extremities: No edema. Neuro: Alert and oriented. No focal deficits. No facial asymmetry. MAE spontaneously. Psych: Responds to questions appropriately with normal affect.    Labs    Chemistry Recent Labs  Lab 09/27/20 1900 09/27/20 1950 09/29/20 0912  NA 134* 138 135  K 3.8 3.8 4.1  CL 104 108 104  CO2 18*  --  22  GLUCOSE 205* 201* 145*   BUN 26* 26* 24*  CREATININE 1.30* 1.20* 1.10*  CALCIUM 9.7  --  9.6  PROT 6.8  --   --   ALBUMIN 3.0*  --   --   AST 28  --   --   ALT 26  --   --   ALKPHOS 47  --   --   BILITOT 0.5  --   --   GFRNONAA 47*  --  58*  ANIONGAP 12  --  9     Hematology Recent Labs  Lab 09/27/20 1900 09/27/20 1950  WBC 6.9  --   RBC 3.86*  --   HGB 12.1 12.2  HCT 36.4 36.0  MCV 94.3  --   MCH 31.3  --   MCHC 33.2  --   RDW 13.1  --   PLT 352  --     Cardiac EnzymesNo results for input(s): TROPONINI in the last 168 hours. No results for input(s): TROPIPOC in the last 168 hours.   BNP Recent Labs  Lab 09/27/20 1916  BNP 724.0*     DDimer No results for input(s): DDIMER in the last 168 hours.   Radiology    CT ANGIO HEAD NECK W WO CM  Result Date: 10/01/2020 CLINICAL  DATA:  Follow-up stroke. Left-sided weakness. 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 EXAM: CT ANGIOGRAPHY HEAD AND NECK TECHNIQUE: Multidetector CT imaging of the head and neck was performed using the standard protocol during bolus administration of intravenous contrast. Multiplanar CT image reconstructions and MIPs were obtained to evaluate the vascular anatomy. Carotid stenosis measurements (when applicable) are obtained utilizing NASCET criteria, using the distal internal carotid diameter as the denominator. CONTRAST:  34mL OMNIPAQUE IOHEXOL 350 MG/ML SOLN COMPARISON:  MRI studies 09/28/2020 FINDINGS: CT HEAD FINDINGS Brain: Acute infarction at the pontomedullary junction shown by MRI is difficult to appreciate by CT. No focal cerebellar finding. Low-density in the right occipital lobe consistent with the 2 cm acute infarction in that region. Other punctate acute infarctions scattered within both hemispheres cannot be specifically appreciated by CT. Chronic small-vessel ischemic changes are present throughout the cerebral hemispheric white matter. No  hemorrhage, hydrocephalus or extra-axial collection. Vascular: There is atherosclerotic calcification of the major vessels at the base of the brain. Skull: Negative Sinuses: Clear/normal Orbits: Normal Review of the MIP images confirms the above findings CTA NECK FINDINGS Aortic arch: No aortic atherosclerotic calcification is seen. No dissection. Branching pattern is normal without origin stenosis. Right carotid system: Common carotid artery widely patent to the bifurcation. There is calcified plaque at the carotid bifurcation but without stenosis. Left carotid system: Common carotid artery widely patent to the bifurcation. There is calcified plaque at the carotid bifurcation and ICA bulb but no stenosis. Vertebral arteries: Both vertebral artery origins are widely patent. Both vertebral arteries appear normal through the cervical region to the foramen magnum. Skeleton: Minimal cervical spondylosis. Other neck: No mass or lymphadenopathy. Upper chest: Normal Review of the MIP images confirms the above findings CTA HEAD FINDINGS Anterior circulation: Both internal carotid arteries are patent through the skull base and siphon regions. The anterior and middle cerebral vessels are patent. No large vessel occlusion or correctable proximal stenosis. No aneurysm or vascular malformation. Posterior circulation: Left vertebral artery is dominant and is widely patent through the foramen magnum. Mild narrowing in the distal V4 segment, but the vessel is patent to the basilar. Right vertebral artery either terminates in PICA or is occluded beyond PICA. Proximal basilar artery shows mild atherosclerotic irregularity. Superior cerebellar and posterior cerebral arteries are patent. Venous sinuses: Patent and normal. Anatomic variants: None significant otherwise. Review of the MIP images confirms the above findings IMPRESSION: The aorta has a normal appearance. Mild atherosclerotic plaque at both carotid bifurcations but without  stenosis on either side. No evidence of ulceration or pronounced irregularity. Mild atherosclerotic change at the carotid siphons but without stenosis. No intracranial large or medium vessel occlusion in the anterior circulation. Right vertebral artery either terminates in PICA or is occluded distal to PICA. Left vertebral artery shows a moderate stenosis of the distal V4 segment but does remain patent to the basilar. Mild atherosclerotic irregularity of the proximal basilar artery. Electronically Signed   By: Nelson Chimes M.D.   On: 10/01/2020 19:34   DG Swallowing Func-Speech Pathology  Result Date: 10/01/2020 Formatting of this result is different from the original. Objective Swallowing Evaluation: Type of Study: MBS-Modified Barium Swallow Study  Patient Details Name: Tracey Meyer MRN: [id number] Date of Birth: 1961/02/15 Today's Date: 10/01/2020 Time: SLP Start Time (ACUTE ONLY): 1159 -SLP Stop Time (ACUTE ONLY): 1212 SLP Time Calculation (min) (ACUTE ONLY): 13 min Past Medical History: Past Medical History: Diagnosis Date  Cardiomyopathy, ischemic 09/24/2020  DM (diabetes mellitus), type 2 ([place]) 09/24/2020  HTN (hypertension)   Hyperlipidemia   Prediabetes   S/P angioplasty with stent to mLAD 09/22/20  09/24/2020  Tobacco abuse 09/24/2020  Transaminitis 09/24/2020 Past Surgical History: Past Surgical History: Procedure Laterality Date  BREAST SURGERY Left   age 84  CORONARY/GRAFT ACUTE MI REVASCULARIZATION N/A 09/22/2020  Procedure: Coronary/Graft Acute MI Revascularization;  Surgeon: Martinique, Peter M, MD;  Location: [facility];  Service: Cardiovascular;  Laterality: N/A;  LEFT HEART CATH AND CORONARY ANGIOGRAPHY N/A 09/22/2020  Procedure: LEFT HEART CATH AND CORONARY ANGIOGRAPHY;  Surgeon: Martinique, Peter M, MD;  Location: [facility];  Service: Cardiovascular;  Laterality: N/A; HPI: Pt is a 60 y.o. female who presented with L sided weakness, slurred speech, orthopnea,  and SOB. MRI brain 7/8: Multiple  scattered acute to early subacute ischemic nonhemorrhagic infarcts involving the bilateral cerebral and cerebellar hemispheres as above, with largest area of infarction measuring up to 2 cm at the right occipital cortex. TPA was not given. 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 Pt passed the Yale swallow screen on 7/7 and 7/8 SLP consulted for swallowing on 7/10 due to "some difficulty swallowing and getting SOB when eating" as noted by referring MD. PMH: DM2, tobacco use and trying to quit, HLD, CAD s/p angio and stenting of mLAD 7/4.  Subjective: pt feels like she's coughing with PO intake, getting SOB Assessment / Plan / Recommendation CHL IP CLINICAL IMPRESSIONS 10/01/2020 Clinical Impression Pt presents with functional oropharyngeal swallowing with good timing and efficiency so that she does not have any residuals or penetration/aspiration. She did have one moment of strong coughing that occurred shortly after swallowing, and she did acknowledge that this is what has been happening during meals, but there was no clear source for this coughing based on oropharyngeal function. Recommend continuing with regular solids and thin liquids as tolerated. SLP will f/u briefly given subjective symptoms. SLP Visit Diagnosis Dysphagia, unspecified (R13.10) Attention and concentration deficit following -- Frontal lobe and executive function deficit following -- Impact on safety and function No limitations   CHL IP TREATMENT RECOMMENDATION 10/01/2020 Treatment Recommendations Therapy as outlined in treatment plan below   Prognosis 10/01/2020 Prognosis for Safe Diet Advancement Good Barriers to Reach Goals -- Barriers/Prognosis Comment -- CHL IP DIET RECOMMENDATION 10/01/2020 SLP Diet Recommendations Regular solids;Thin liquid Liquid Administration via Cup;Straw Medication Administration Whole meds with liquid Compensations Slow rate;Small sips/bites Postural Changes Seated upright at 90 degrees   CHL  IP OTHER RECOMMENDATIONS 10/01/2020 Recommended Consults -- Oral Care Recommendations Oral care BID Other Recommendations --   CHL IP FOLLOW UP RECOMMENDATIONS 10/01/2020 Follow up Recommendations Inpatient Rehab   CHL IP FREQUENCY AND DURATION 10/01/2020 Speech Therapy Frequency (ACUTE ONLY) min 1 x/week Treatment Duration 1 week      CHL IP ORAL PHASE 10/01/2020 Oral Phase WFL Oral - Pudding Teaspoon -- Oral - Pudding Cup -- Oral - Honey Teaspoon -- Oral - Honey Cup -- Oral - Nectar Teaspoon -- Oral - Nectar Cup -- Oral - Nectar Straw -- Oral - Thin Teaspoon -- Oral - Thin Cup -- Oral - Thin Straw -- Oral - Puree -- Oral - Mech Soft -- Oral - Regular -- Oral - Multi-Consistency -- Oral - Pill -- Oral Phase - Comment --  CHL IP PHARYNGEAL PHASE 10/01/2020 Pharyngeal Phase WFL Pharyngeal- Pudding Teaspoon -- Pharyngeal -- Pharyngeal- Pudding Cup -- Pharyngeal -- Pharyngeal- Honey  Teaspoon -- Pharyngeal -- Pharyngeal- Honey Cup -- Pharyngeal -- Pharyngeal- Nectar Teaspoon -- Pharyngeal -- Pharyngeal- Nectar Cup -- Pharyngeal -- Pharyngeal- Nectar Straw -- Pharyngeal -- Pharyngeal- Thin Teaspoon -- Pharyngeal -- Pharyngeal- Thin Cup -- Pharyngeal -- Pharyngeal- Thin Straw -- Pharyngeal -- Pharyngeal- Puree -- Pharyngeal -- Pharyngeal- Mechanical Soft -- Pharyngeal -- Pharyngeal- Regular -- Pharyngeal -- Pharyngeal- Multi-consistency -- Pharyngeal -- Pharyngeal- Pill -- Pharyngeal -- Pharyngeal Comment --  CHL IP CERVICAL ESOPHAGEAL PHASE 10/01/2020 Cervical Esophageal Phase WFL Pudding Teaspoon -- Pudding Cup -- Honey Teaspoon -- Honey Cup -- Nectar Teaspoon -- Nectar Cup -- Nectar Straw -- Thin Teaspoon -- Thin Cup -- Thin Straw -- Puree -- Mechanical Soft -- Regular -- Multi-consistency -- Pill -- Cervical Esophageal Comment -- Osie Bond., M.A. CCC-SLP Acute Rehabilitation Services Pager [phone] Office [phone] 10/01/2020, 1:53 PM              ECHO TEE  Result Date: 10/02/2020    TRANSESOPHOGEAL ECHO REPORT    Patient Name:   Tracey Meyer Date of Exam: 10/02/2020 Medical Rec #:  [id number]          Height:       64.0 in Accession #:    [id number]         Weight:       154.5 lb Date of Birth:  1960-06-19          BSA:          1.753 m Patient Age:    60 years           BP:           147/70 mmHg Patient Gender: F                  HR:           75 bpm. Exam Location:  Inpatient Procedure: Transesophageal Echo, 3D Echo, Cardiac Doppler, Color Doppler and            Strain Analysis Indications:    Cerebral Infarction, unspecified I63.9  History:        Patient has prior history of Echocardiogram examinations, most                 recent 09/28/2020. CHF; Risk Factors:Diabetes, Dyslipidemia and                 Current Smoker.  Sonographer:    Darlina Sicilian RDCS Referring Phys: [place]: After discussion of the risks and benefits of a TEE, an informed consent was obtained from the patient. TEE procedure time was 11 minutes. The transesophogeal probe was passed without difficulty through the esophogus of the patient. Imaged were obtained with the patient in a left lateral decubitus position. Sedation performed by different physician. The patient was monitored while under deep sedation. Image quality was good. The patient's vital signs; including heart rate, blood pressure, and oxygen saturation; remained stable throughout the procedure. The patient developed no complications during the procedure. IMPRESSIONS  1. Akinesis of the anteroseptal wall and apex with overall moderate to severe LV dysfunction; no obvious apical thrombus noted using definity.  2. Left ventricular ejection fraction, by estimation, is 30 to 35%. The left ventricle has moderate to severely decreased function. The left ventricle demonstrates regional wall motion abnormalities (see scoring diagram/findings for description). The left ventricular internal cavity size was mildly dilated.  3. Right ventricular systolic function is normal.  The right ventricular size is normal.  4. Left atrial size was mildly dilated. No left atrial/left atrial appendage thrombus was detected.  5. A small pericardial effusion is present.  6. The mitral valve is normal in structure. Mild mitral valve regurgitation.  7. The aortic valve is tricuspid. Aortic valve regurgitation is not visualized.  8. There is mild (Grade II) plaque involving the descending aorta.  9. Agitated saline contrast bubble study was positive with shunting observed after >6 cardiac cycles suggestive of intrapulmonary shunting. FINDINGS  Left Ventricle: Left ventricular ejection fraction, by estimation, is 30 to 35%. The left ventricle has moderate to severely decreased function. The left ventricle demonstrates regional wall motion abnormalities. The left ventricular internal cavity size was mildly dilated. Right Ventricle: The right ventricular size is normal. Right ventricular systolic function is normal. Left Atrium: Left atrial size was mildly dilated. No left atrial/left atrial appendage thrombus was detected. Right Atrium: Right atrial size was normal in size. Pericardium: A small pericardial effusion is present. Mitral Valve: The mitral valve is normal in structure. Mild mitral valve regurgitation. Tricuspid Valve: The tricuspid valve is normal in structure. Tricuspid valve regurgitation is trivial. Aortic Valve: The aortic valve is tricuspid. Aortic valve regurgitation is not visualized. Pulmonic Valve: The pulmonic valve was normal in structure. Pulmonic valve regurgitation is not visualized. Aorta: The aortic root is normal in size and structure. There is mild (Grade II) plaque involving the descending aorta. IAS/Shunts: No atrial level shunt detected by color flow Doppler. Agitated saline contrast bubble study was positive with shunting observed after >6 cardiac cycles suggestive of intrapulmonary shunting. Additional Comments: Akinesis of the anteroseptal wall and apex with overall  moderate to severe LV dysfunction; no obvious apical thrombus noted using definity.   3D Volume EF LV 3D EDV:   115.45 ml LV 3D ESV:   76.35 ml  AORTA Ao Root diam: 2.50 cm Ao Asc diam:  2.70 cm MITRAL VALVE MV Area (plan): 5.03 cm Kirk Ruths MD Electronically signed by Kirk Ruths MD Signature Date/Time: 10/02/2020/10:16:20 AM    Final     Telemetry    10/03/20 NSR 60-80 - Personally Reviewed  ECG    No new tracing as of 10/03/20 - Personally Reviewed  Cardiac Studies   Echo 09/22/20:  1. Apex well-visualized without contrast - false tendon (normal variant)  - no thrombus. Left ventricular ejection fraction, by estimation, is 35 to  40%. The left ventricle has moderately decreased function. The left  ventricle demonstrates regional wall  motion abnormalities (see scoring diagram/findings for description). There  is mild left ventricular hypertrophy. Left ventricular diastolic  parameters are consistent with Grade I diastolic dysfunction (impaired  relaxation). Elevated left ventricular  end-diastolic pressure. There is severe hypokinesis of the left  ventricular, entire anterior wall, anteroseptal wall, apical segment and  inferoapical segment. Findings suggest LAD territory ischemia/infarct.   2. Right ventricular systolic function is normal. The right ventricular  size is normal. There is normal pulmonary artery systolic pressure.   3. The mitral valve is abnormal. Mild mitral valve regurgitation.   4. The aortic valve is tricuspid. Aortic valve regurgitation is not  visualized.   5. The inferior vena cava is normal in size with <50% respiratory  variability, suggesting right atrial pressure of 8 mmHg.   LHC 09/22/20: RPDA lesion is 50% stenosed. Dist Cx lesion is 30% stenosed with 30% stenosed side branch in LPAV. 1st Diag lesion is 60% stenosed. Mid LAD lesion is 100% stenosed. Post intervention, there is a 0% residual  stenosis. A drug-eluting stent was successfully placed  using a STENT ONYX FRONTIER 2.5X38. LV end diastolic pressure is mildly elevated.   1. 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 Mildly elevated LVEDP 21 mmHg 3. Successful PCI of the mid LAD with DES x 1   Diagnostic Dominance: Co-dominant      Intervention          Echocardiogram 09/28/20: 1. Left ventricular ejection fraction, by estimation, is 30 to 35%. The  left ventricle has moderately decreased function. The left ventricle  demonstrates regional wall motion abnormalities (see scoring  diagram/findings for description). There is mild  left ventricular hypertrophy. Left ventricular diastolic parameters are  consistent with Grade II diastolic dysfunction (pseudonormalization).  Elevated left atrial pressure.   2. No LV thrombus seen   3. Right ventricular systolic function is normal. The right ventricular  size is normal. Tricuspid regurgitation signal is inadequate for assessing  PA pressure.   4. The mitral valve is normal in structure. Trivial mitral valve  regurgitation.   5. The aortic valve was not well visualized. Aortic valve regurgitation  is not visualized. No aortic stenosis is present.   6. The inferior vena cava is normal in size with greater than 50%  respiratory variability, suggesting right atrial pressure of 3 mmHg.   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  Patient Profile     60 y.o. female with a PMH of recent STEMI s/p PCI/DES to LAD 09/21/20, ICM/Chronic combined CHF, HTN, HLD, DM type 2, who presented with left sided weakness, found to have an acute CVA.    Assessment & Plan    1. Acute CVA:  Pt presented with left sided weakness which she reports starting 09/24/20  after returning home from a hospital stay for STEMI earlier that day. Found to have acute/subacute non-hemorrhagic strokes on MRI. MRA neck showed 70% L ICA stenosis for which outpatient vascular surgery follow-up is recommended. Neurology following and recommending permissive HTN with BP goal at 130-150. There is high suspicion for cardioembolic source. Echo this admission without LV thrombus. TEE also without LV thrombus. -Planning for CIR discharge  -Continue DAPT with aspirin and brilinta -Continue statin   2. Recent STEMI:  -Presented with anterior STEMI-delayed presentation 09/21/20 and underwent LHC with PCI/DES to LAD. She had an ICM with EF 35-40% with anterior hypokinesis but no apical thrombus at that time. -Denies chest pain  -Continue aspirin and brilinta -Continue statin   3. ICM/Chronic combined CHF:  -Echo 09/22/20 at the time of her STEMI showed EF 35-40% with severe hypokinesis of the anterior wall, G1DD, mild LVH, and mild MR.  -Started on carvedilol, entresto, and spironolactone post MI with patient reports of orthopnea and SOB on this admission.  -BNP 724>>>p from 600s 1 week prior  -CXR clear lungs with no acute findings.  -Echo this admission with EF 30-35%, G2DD, ongoing WMA, and no evidence of LV thrombus.  -Antihypertensives currently on hold to allow for permissive hypertension  -Appears euvolemic  -Per neurology, "Given significance in posterior circulation vascular stenosis, recommend long-term BP goal 130-150." -Beta blocker restarted yesterday>>transitioned to succinate to minimized hypotension  -She is tolerating the above well and is within GOAL BP therefore will hold off on adding additional GDMT at  this time  -Would consider adding spiro next>>>likely will not tolerate Entresto    4. HLD:  -LDL 130 this admission>>recently started on low dose crestor 1 week ago which has been up titrated to 40mg  QD this admission -Will need repeat FLP/LFTs in 6-8 weeks   5.  Carotid artery stenosis:  -Found to have 70% left ICA stenosis on imaging this admission. Seen by Vascular surgery who states this is unlikely to be the etiology of her stroke given bilateral infarcts in both cerebellar and cerebral regions. -Plan is for outpatient follow-up with Vascular Surgery with repeat carotid dopplers in 3 months. -Continue aspirin and statin  Signed, Kathyrn Drown NP-C [place] Pager: [phone] 10/03/2020, 7:54 AM     For questions or updates, please contact   Please consult [email or web address] for contact info under Cardiology/STEMI.  History and all data above reviewed.  Patient examined.  I agree with the findings as above.  Mild left shoulder pain with movement. The patient exam reveals COR:RRR  ,  Lungs: Clear  ,  Abd: Positive bowel sounds, no rebound no guarding, Ext No edema   .  All available labs, radiology testing, previous records reviewed. Agree with documented assessment and plan. CAD:  Continue medical therapy as above.    Labile BP and recent stroke preclude aggressive medication titration.  We will follow as needed during this hospitalization and through her in patient rehab stay.  Jeneen Rinks Angee Gupton  11:10 AM  10/03/2020

## 2020-10-03 NOTE — Progress Notes (Signed)
Physical Therapy Treatment Patient Details Name: Tracey Meyer MRN: [id number] DOB: [birth year] Today's Date: 10/03/2020    History of Present Illness 60 yo female presents to [facility] on 7/7 with L weakness, shortness of breath that started on 7/4. 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 CTH specifically shows focus of peripheral hypoattenuation in posterior R parietooccipital lobe. PMH significant for DM2, HTN, ischemic cardiomyopathy EF 35-40%, tobacco use and trying to quit, HLD, breast cancer with L radical mastectomy, CAD s/p angio and stenting of mLAD d/c 7/3.    PT Comments    Pt received in bed, agreeable to participation in therapy. Presents with 2-/5 strength L hip and knee, 0/5 at ankle. She required mod assist bed mobility. Dependent transfer bed to recliner using stedy. Once in recliner, sit to stand trials with RW x 3. Mod assist to power up to stand. Min assist to maintain static standing balance with RW x 1 minute/trial. Pt remained in recliner with feet elevated at end of session.   Follow Up Recommendations  SNF     Equipment Recommendations  Other (comment) (defer to post acute)    Recommendations for Other Services       Precautions / Restrictions Precautions Precautions: Fall Precaution Comments: L hemiplegia Restrictions Weight Bearing Restrictions: No    Mobility  Bed Mobility Overal bed mobility: Needs Assistance Bed Mobility: Supine to Sit     Supine to sit: Mod assist;HOB elevated     General bed mobility comments: cues for sequencing, increased time, assist with LLE and to elevate trunk, use of bed pad to scoot to EOB    Transfers Overall transfer level: Needs assistance Equipment used: Rolling walker (2 wheeled) Transfers: Sit to/from [facility] Sit to Stand: Mod assist Stand pivot transfers: Total assist       General transfer comment: mod  assist to power up to stand with RW x 3 trials. Cues for sequencing. Assist to place L hand on RW. Total assist bed to recliner using stedy.  Ambulation/Gait                 Stairs             Wheelchair Mobility    Modified Rankin (Stroke Patients Only)       Balance Overall balance assessment: Needs assistance Sitting-balance support: Feet supported;Single extremity supported Sitting balance-Leahy Scale: Fair Sitting balance - Comments: EOB sitting statically at supervision level   Standing balance support: During functional activity;Single extremity supported;Bilateral upper extremity supported Standing balance-Leahy Scale: Poor Standing balance comment: reliant on external support. Static stand with RW x 3 trials. 1 minute/trial. Min assist to maintain balance.                            Cognition Arousal/Alertness: Awake/alert Behavior During Therapy: WFL for tasks assessed/performed Overall Cognitive Status: Impaired/Different from baseline Area of Impairment: Following commands;Safety/judgement;Problem solving                       Following Commands: Follows one step commands consistently Safety/Judgement: Decreased awareness of safety;Decreased awareness of deficits   Problem Solving: Difficulty sequencing;Requires verbal cues;Requires tactile cues        Exercises      General Comments General comments (skin integrity, edema, etc.): VSS      Pertinent Vitals/Pain Pain Assessment: Faces Faces Pain Scale: Hurts little  more Pain Location: buttocks with sitting Pain Descriptors / Indicators: Grimacing;Discomfort Pain Intervention(s): Monitored during session;Repositioned;Other (comment) (air cusion in recliner)    Home Living                      Prior Function            PT Goals (current goals can now be found in the care plan section) Acute Rehab PT Goals Patient Stated Goal: not stated Progress towards  PT goals: Progressing toward goals    Frequency    Min 4X/week      PT Plan Discharge plan needs to be updated    Co-evaluation              AM-PAC PT "6 Clicks" Mobility   Outcome Measure  Help needed turning from your back to your side while in a flat bed without using bedrails?: A Little Help needed moving from lying on your back to sitting on the side of a flat bed without using bedrails?: A Lot Help needed moving to and from a bed to a chair (including a wheelchair)?: A Lot Help needed standing up from a chair using your arms (e.g., wheelchair or bedside chair)?: A Lot Help needed to walk in hospital room?: Total Help needed climbing 3-5 steps with a railing? : Total 6 Click Score: 11    End of Session Equipment Utilized During Treatment: Gait belt Activity Tolerance: Patient tolerated treatment well Patient left: in chair;with call bell/phone within reach;with chair alarm set Nurse Communication: Mobility status;Need for lift equipment PT Visit Diagnosis: Other abnormalities of gait and mobility (R26.89);Hemiplegia and hemiparesis Hemiplegia - Right/Left: Left Hemiplegia - dominant/non-dominant: Non-dominant Hemiplegia - caused by: Cerebral infarction     Time: 5400-8676 PT Time Calculation (min) (ACUTE ONLY): 17 min  Charges:  $Therapeutic Activity: 8-22 mins                     Lorrin Goodell, PT  Office # [phone] Pager [phone]    Tracey Meyer 10/03/2020, 9:42 AM

## 2020-10-03 NOTE — Progress Notes (Signed)
Occupational Therapy Treatment Patient Details Name: Tracey Meyer MRN: [id number] DOB: 25-Jun-1960 Today's Date: 10/03/2020    History of present illness 60 yo female presents to [facility] on 7/7 with L weakness, shortness of breath that started on 7/4. 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 CTH specifically shows focus of peripheral hypoattenuation in posterior R parietooccipital lobe. PMH significant for DM2, HTN, ischemic cardiomyopathy EF 35-40%, tobacco use and trying to quit, HLD, breast cancer with L radical mastectomy, CAD s/p angio and stenting of mLAD d/c 7/3.   OT comments  Pt making progress with functional goals. Pt in recliner upon arrival. Session focused on sit - stand transitions using RW, recliner to bed transfer using Stedy, sitting EOB for siting tolerance, L UE ROM, UB dressing and grooming. Bed mobility returned to supine with visitor in and end of session. OT will continue to follow acutely to maximize level of function and safety  Follow Up Recommendations  SNF    Equipment Recommendations  3 in 1 bedside commode;Tub/shower seat;Wheelchair (measurements OT);Wheelchair cushion (measurements OT);Other (comment) (TBD at SNF)    Recommendations for Other Services      Precautions / Restrictions Precautions Precautions: Fall Precaution Comments: L hemiplegia Restrictions Weight Bearing Restrictions: No       Mobility Bed Mobility Overal bed mobility: Needs Assistance Bed Mobility: Sit to Supine     Supine to sit: Mod assist;HOB elevated Sit to supine: Mod assist   General bed mobility comments: mod a with LE mgt onto bed    Transfers Overall transfer level: Needs assistance Equipment used: Rolling walker (2 wheeled) Transfers: Sit to/from [facility] Sit to Stand: Mod assist Stand pivot transfers: Total assist       General transfer comment: sit - stand from  recliner with RW x 2 trials. Used Stedy to return to EOB (seated)    Balance Overall balance assessment: Needs assistance Sitting-balance support: Feet supported;Single extremity supported Sitting balance-Leahy Scale: Fair Sitting balance - Comments: EOB sitting statically at supervision level   Standing balance support: During functional activity;Single extremity supported;Bilateral upper extremity supported Standing balance-Leahy Scale: Poor Standing balance comment: reliant on external support. Static stand with RW x 3 trials. 1 minute/trial. Min assist to maintain balance.                           ADL either performed or assessed with clinical judgement   ADL Overall ADL's : Needs assistance/impaired     Grooming: Wash/dry hands;Wash/dry face;Minimal assistance;Sitting           Upper Body Dressing : Moderate assistance;Sitting       Toilet Transfer: Moderate assistance;Cueing for safety;Cueing for sequencing;RW Toilet Transfer Details (indicate cue type and reason): Simulated recliner to EOB; mod A sit - stand from recliner, used Stedy for transfer to sit EOB Toileting- Water quality scientist and Hygiene: Moderate assistance;Sit to/from stand       Functional mobility during ADLs: Moderate assistance;Cueing for safety;Cueing for sequencing       Vision Patient Visual Report: Blurring of vision     Perception     Praxis      Cognition Arousal/Alertness: Awake/alert Behavior During Therapy: WFL for tasks assessed/performed Overall Cognitive Status: Impaired/Different from baseline Area of Impairment: Following commands;Safety/judgement;Problem solving                       Following  Commands: Follows one step commands consistently Safety/Judgement: Decreased awareness of safety;Decreased awareness of deficits   Problem Solving: Difficulty sequencing;Requires verbal cues;Requires tactile cues          Exercises Other Exercises Other  Exercises: AAROM of L UE in multiple planes. Pt instructed on self ROM using R UE to assist L UE with shoudler flexion, ABD, extension, elbow flexion and extension   Shoulder Instructions       General Comments VSS    Pertinent Vitals/ Pain       Pain Assessment: Faces Faces Pain Scale: Hurts little more Pain Location: L hand Pain Descriptors / Indicators: Grimacing;Discomfort Pain Intervention(s): Monitored during session;Repositioned  Home Living                                          Prior Functioning/Environment              Frequency  Min 2X/week        Progress Toward Goals  OT Goals(current goals can now be found in the care plan section)  Progress towards OT goals: Progressing toward goals  Acute Rehab OT Goals Patient Stated Goal: go to rehab and go home  Plan Discharge plan remains appropriate    Co-evaluation                 AM-PAC OT "6 Clicks" Daily Activity     Outcome Measure   Help from another person eating meals?: None (set up) Help from another person taking care of personal grooming?: A Little Help from another person toileting, which includes using toliet, bedpan, or urinal?: A Lot Help from another person bathing (including washing, rinsing, drying)?: A Lot Help from another person to put on and taking off regular upper body clothing?: A Lot Help from another person to put on and taking off regular lower body clothing?: A Lot 6 Click Score: 15    End of Session Equipment Utilized During Treatment: Gait belt;Rolling walker;Other (comment) Charlaine Dalton)  OT Visit Diagnosis: Unsteadiness on feet (R26.81);Other abnormalities of gait and mobility (R26.89);Muscle weakness (generalized) (M62.81);Pain;Hemiplegia and hemiparesis;Other symptoms and signs involving cognitive function Hemiplegia - Right/Left: Left Hemiplegia - dominant/non-dominant: Non-Dominant Hemiplegia - caused by: Cerebral infarction Pain - Right/Left:  Left Pain - part of body: Hand   Activity Tolerance Patient tolerated treatment well   Patient Left with call bell/phone within reach;in bed;with bed alarm set;with family/visitor present   Nurse Communication          Time: 4076-8088 OT Time Calculation (min): 24 min  Charges: OT General Charges $OT Visit: 1 Visit OT Treatments $Self Care/Home Management : 8-22 mins $Therapeutic Activity: 8-22 mins     Britt Bottom 10/03/2020, 11:50 AM

## 2020-10-04 LAB — GLUCOSE, CAPILLARY
Glucose-Capillary: 181 mg/dL — ABNORMAL HIGH (ref 70–99)
Glucose-Capillary: 155 mg/dL — ABNORMAL HIGH (ref 70–99)
Glucose-Capillary: 158 mg/dL — ABNORMAL HIGH (ref 70–99)
Glucose-Capillary: 157 mg/dL — ABNORMAL HIGH (ref 70–99)

## 2020-10-04 LAB — CBC WITH DIFFERENTIAL/PLATELET
Abs Immature Granulocytes: 0.03 10*3/uL (ref 0.00–0.07)
Basophils Absolute: 0 10*3/uL (ref 0.0–0.1)
Basophils Relative: 0 %
Eosinophils Absolute: 0.3 10*3/uL (ref 0.0–0.5)
Eosinophils Relative: 3 %
HCT: 34.4 % — ABNORMAL LOW (ref 36.0–46.0)
Hemoglobin: 11.3 g/dL — ABNORMAL LOW (ref 12.0–15.0)
Immature Granulocytes: 0 %
Lymphocytes Relative: 15 %
Lymphs Abs: 1.3 10*3/uL (ref 0.7–4.0)
MCH: 30.9 pg (ref 26.0–34.0)
MCHC: 32.8 g/dL (ref 30.0–36.0)
MCV: 94 fL (ref 80.0–100.0)
Monocytes Absolute: 0.6 10*3/uL (ref 0.1–1.0)
Monocytes Relative: 7 %
Neutro Abs: 6.2 10*3/uL (ref 1.7–7.7)
Neutrophils Relative %: 75 %
Platelets: 393 10*3/uL (ref 150–400)
RBC: 3.66 MIL/uL — ABNORMAL LOW (ref 3.87–5.11)
RDW: 12.6 % (ref 11.5–15.5)
WBC: 8.4 10*3/uL (ref 4.0–10.5)
nRBC: 0 % (ref 0.0–0.2)

## 2020-10-04 LAB — COMPREHENSIVE METABOLIC PANEL
ALT: 14 U/L (ref 0–44)
AST: 14 U/L — ABNORMAL LOW (ref 15–41)
Albumin: 2.6 g/dL — ABNORMAL LOW (ref 3.5–5.0)
Alkaline Phosphatase: 55 U/L (ref 38–126)
Anion gap: 8 (ref 5–15)
BUN: 18 mg/dL (ref 6–20)
CO2: 23 mmol/L (ref 22–32)
Calcium: 9.3 mg/dL (ref 8.9–10.3)
Chloride: 104 mmol/L (ref 98–111)
Creatinine, Ser: 1.13 mg/dL — ABNORMAL HIGH (ref 0.44–1.00)
GFR, Estimated: 56 mL/min — ABNORMAL LOW (ref >60)
Glucose, Bld: 146 mg/dL — ABNORMAL HIGH (ref 70–99)
Potassium: 4.3 mmol/L (ref 3.5–5.1)
Sodium: 135 mmol/L (ref 135–145)
Total Bilirubin: 0.6 mg/dL (ref 0.3–1.2)
Total Protein: 6.5 g/dL (ref 6.5–8.1)

## 2020-10-04 LAB — MAGNESIUM: Magnesium: 2 mg/dL (ref 1.7–2.4)

## 2020-10-04 LAB — PHOSPHORUS: Phosphorus: 3.2 mg/dL (ref 2.5–4.6)

## 2020-10-04 MED ORDER — DOCUSATE SODIUM 100 MG PO CAPS
100.0000 mg | ORAL_CAPSULE | Freq: Every day | ORAL | Status: DC
  Administered 2020-10-04 – 2020-10-09 (×6): 100 mg via ORAL
  Filled 2020-10-04 (×6): qty 1

## 2020-10-04 NOTE — Progress Notes (Signed)
  Speech Language Pathology Treatment: Cognitive-Linquistic (Dysarthria)  Patient Details Name: Tracey Meyer MRN: [id number] DOB: 1960-10-12 Today's Date: 10/04/2020 Time: 3329-5188 SLP Time Calculation (min) (ACUTE ONLY): 17 min  Assessment / Plan / Recommendation Clinical Impression  Pt was seen for treatment and she was cooperative during the session. She was educated regarding the nature of dysarthria, and compensatory strategies to improve speech intelligibility. Pt verbalized understanding regarding all areas of education. She used compensatory strategies at the word level with 80% accuracy and at the phrase level with 62% accuracy increasing to 100% accuracy with cues for overarticulation and vocal intensity. Some self-correction was noted during conversation following these activities. She demonstrated 100% accuracy with mental manipulation sequencing task. She completed time management problems with 60% accuracy increasing to 100% with cues for reasoning. Pt's session was abbreviated to allow her to eat dinner which arrived during the session. SLP will continue to follow pt.    HPI HPI: Pt is a 60 y.o. female who presented with L sided weakness, slurred speech, orthopnea,  and SOB. MRI brain 7/8: Multiple scattered acute to early subacute ischemic nonhemorrhagic infarcts involving the bilateral cerebral and cerebellar hemispheres as above, with largest area of infarction measuring up to 2 cm at the right occipital cortex. TPA was not given. 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 Pt passed the Yale swallow screen on 7/7 and 7/8 SLP consulted for swallowing on 7/10 due to "some difficulty swallowing and getting SOB when eating" as noted by referring MD. PMH: DM2, tobacco use and trying to quit, HLD, CAD s/p angio and stenting of mLAD 7/4.      SLP Plan  Continue with current plan of care       Recommendations  Diet recommendations: Regular;Thin  liquid Liquids provided via: Cup;Straw Medication Administration: Whole meds with liquid Supervision: Patient able to self feed Compensations: Slow rate;Small sips/bites Postural Changes and/or Swallow Maneuvers: Seated upright 90 degrees                Oral Care Recommendations: Oral care BID Follow up Recommendations: Skilled Nursing facility SLP Visit Diagnosis: Dysarthria and anarthria (R47.1);Cognitive communication deficit (R41.841) Plan: Continue with current plan of care       Kailin Principato I. Hardin Negus, [place], [place] Office number [phone] Pager [place] 10/04/2020, 5:40 PM

## 2020-10-04 NOTE — TOC Progression Note (Signed)
Transition of Care [facility]) - Progression Note    Patient Details  Name: HEDDA CRUMBLEY MRN: [id number] Date of Birth: 1960/12/31  Transition of [facility]) CM/SW [place], [place] Phone Number: 10/04/2020, 2:27 PM  Clinical Narrative:     No current SNF bed offers. Patient has barriers to placement. Patient has no insurance. TOC leadership has been informed. CSW will continue to follow and assist with dc planning needs.  Expected Discharge Plan: [place] Barriers to Discharge: Continued Medical Work up  Expected Discharge Plan and Services Expected Discharge Plan: [place] In-house Referral: Clinical Social Work     Living arrangements for the past 2 months: Single Family Home                                       Social Determinants of Health (SDOH) Interventions    Readmission Risk Interventions No flowsheet data found.

## 2020-10-04 NOTE — Progress Notes (Addendum)
Progress Note  Patient Name: Tracey Meyer Date of Encounter: 10/04/2020  Primary Cardiologist: Peter Martinique, MD   Subjective   Patient states she is little SOB today with transferring from bed to chair. She is trying to regain some capacity to move her left side. She c/o 2 days without BM. She denied any chest pain.   Inpatient Medications    Scheduled Meds:  aspirin EC  81 mg Oral Daily   docusate sodium  100 mg Oral Daily   enoxaparin (LOVENOX) injection  40 mg Subcutaneous Q24H   famotidine  20 mg Oral Daily   insulin aspart  0-5 Units Subcutaneous QHS   insulin aspart  0-9 Units Subcutaneous TID WC   metoprolol succinate  25 mg Oral Daily   rosuvastatin  40 mg Oral Daily   ticagrelor  90 mg Oral BID   Continuous Infusions:  PRN Meds: acetaminophen **OR** acetaminophen (TYLENOL) oral liquid 160 mg/5 mL **OR** acetaminophen, alum & mag hydroxide-simeth   Vital Signs    Vitals:   10/03/20 2008 10/03/20 2340 10/04/20 0502 10/04/20 0814  BP: (!) 155/76 124/72 127/62 (!) 149/84  Pulse: 79 78 71 79  Resp: 16 (!) 21 15 (!) 22  Temp: 98.8 F (37.1 C) 97.8 F (36.6 C) 97.8 F (36.6 C) 98 F (36.7 C)  TempSrc: Oral Oral Oral Oral  SpO2: 100% 97% 99% 98%  Weight:      Height:       No intake or output data in the 24 hours ending 10/04/20 0830  Filed Weights   09/30/20 0438 10/01/20 0500 10/02/20 0326  Weight: 69.5 kg 69 kg 70.1 kg    Physical Exam   General: Well developed, well nourished Neck: No JVD Lungs:Clear to ausculation bilaterally. No wheezes, rales, or rhonchi. Breathing is unlabored. Cardiovascular: RRR with S1 S2. No murmurs MSK: left sided hemiparesis  Extremities: No BLE edema. Neuro: Alert and oriented x3, able to follow commands appopriately. Psych: calm and cooperative    Labs    Chemistry Recent Labs  Lab 09/27/20 1900 09/27/20 1950 09/29/20 0912 10/03/20 0821 10/04/20 0058  NA 134*   < > 135 133* 135  K 3.8   < > 4.1  4.0 4.3  CL 104   < > 104 104 104  CO2 18*  --  22 21* 23  GLUCOSE 205*   < > 145* 140* 146*  BUN 26*   < > 24* 18 18  CREATININE 1.30*   < > 1.10* 1.10* 1.13*  CALCIUM 9.7  --  9.6 9.4 9.3  PROT 6.8  --   --  7.0 6.5  ALBUMIN 3.0*  --   --  2.7* 2.6*  AST 28  --   --  16 14*  ALT 26  --   --  15 14  ALKPHOS 47  --   --  55 55  BILITOT 0.5  --   --  0.6 0.6  GFRNONAA 47*  --  58* 58* 56*  ANIONGAP 12  --  9 8 8    < > = values in this interval not displayed.     Hematology Recent Labs  Lab 09/27/20 1900 09/27/20 1950 10/03/20 0821 10/04/20 0058  WBC 6.9  --  9.6 8.4  RBC 3.86*  --  3.80* 3.66*  HGB 12.1 12.2 11.8* 11.3*  HCT 36.4 36.0 36.0 34.4*  MCV 94.3  --  94.7 94.0  MCH 31.3  --  31.1  30.9  MCHC 33.2  --  32.8 32.8  RDW 13.1  --  12.8 12.6  PLT 352  --  420* 393    Cardiac EnzymesNo results for input(s): TROPONINI in the last 168 hours. No results for input(s): TROPIPOC in the last 168 hours.   BNP Recent Labs  Lab 09/27/20 1916  BNP 724.0*     DDimer No results for input(s): DDIMER in the last 168 hours.   Radiology    ECHO TEE  Result Date: 10/02/2020    TRANSESOPHOGEAL ECHO REPORT   Patient Name:   Tracey Meyer Date of Exam: 10/02/2020 Medical Rec #:  [id number]          Height:       64.0 in Accession #:    [id number]         Weight:       154.5 lb Date of Birth:  1961-01-28          BSA:          1.753 m Patient Age:    60 years           BP:           147/70 mmHg Patient Gender: F                  HR:           75 bpm. Exam Location:  Inpatient Procedure: Transesophageal Echo, 3D Echo, Cardiac Doppler, Color Doppler and            Strain Analysis Indications:    Cerebral Infarction, unspecified I63.9  History:        Patient has prior history of Echocardiogram examinations, most                 recent 09/28/2020. CHF; Risk Factors:Diabetes, Dyslipidemia and                 Current Smoker.  Sonographer:    Darlina Sicilian RDCS Referring Phys: [place]: After discussion of the risks and benefits of a TEE, an informed consent was obtained from the patient. TEE procedure time was 11 minutes. The transesophogeal probe was passed without difficulty through the esophogus of the patient. Imaged were obtained with the patient in a left lateral decubitus position. Sedation performed by different physician. The patient was monitored while under deep sedation. Image quality was good. The patient's vital signs; including heart rate, blood pressure, and oxygen saturation; remained stable throughout the procedure. The patient developed no complications during the procedure. IMPRESSIONS  1. Akinesis of the anteroseptal wall and apex with overall moderate to severe LV dysfunction; no obvious apical thrombus noted using definity.  2. Left ventricular ejection fraction, by estimation, is 30 to 35%. The left ventricle has moderate to severely decreased function. The left ventricle demonstrates regional wall motion abnormalities (see scoring diagram/findings for description). The left ventricular internal cavity size was mildly dilated.  3. Right ventricular systolic function is normal. The right ventricular size is normal.  4. Left atrial size was mildly dilated. No left atrial/left atrial appendage thrombus was detected.  5. A small pericardial effusion is present.  6. The mitral valve is normal in structure. Mild mitral valve regurgitation.  7. The aortic valve is tricuspid. Aortic valve regurgitation is not visualized.  8. There is mild (Grade II) plaque involving the descending aorta.  9. Agitated saline contrast bubble study was positive with shunting observed after >6 cardiac  cycles suggestive of intrapulmonary shunting. FINDINGS  Left Ventricle: Left ventricular ejection fraction, by estimation, is 30 to 35%. The left ventricle has moderate to severely decreased function. The left ventricle demonstrates regional wall motion abnormalities. The left  ventricular internal cavity size was mildly dilated. Right Ventricle: The right ventricular size is normal. Right ventricular systolic function is normal. Left Atrium: Left atrial size was mildly dilated. No left atrial/left atrial appendage thrombus was detected. Right Atrium: Right atrial size was normal in size. Pericardium: A small pericardial effusion is present. Mitral Valve: The mitral valve is normal in structure. Mild mitral valve regurgitation. Tricuspid Valve: The tricuspid valve is normal in structure. Tricuspid valve regurgitation is trivial. Aortic Valve: The aortic valve is tricuspid. Aortic valve regurgitation is not visualized. Pulmonic Valve: The pulmonic valve was normal in structure. Pulmonic valve regurgitation is not visualized. Aorta: The aortic root is normal in size and structure. There is mild (Grade II) plaque involving the descending aorta. IAS/Shunts: No atrial level shunt detected by color flow Doppler. Agitated saline contrast bubble study was positive with shunting observed after >6 cardiac cycles suggestive of intrapulmonary shunting. Additional Comments: Akinesis of the anteroseptal wall and apex with overall moderate to severe LV dysfunction; no obvious apical thrombus noted using definity.   3D Volume EF LV 3D EDV:   115.45 ml LV 3D ESV:   76.35 ml  AORTA Ao Root diam: 2.50 cm Ao Asc diam:  2.70 cm MITRAL VALVE MV Area (plan): 5.03 cm Kirk Ruths MD Electronically signed by Kirk Ruths MD Signature Date/Time: 10/02/2020/10:16:20 AM    Final     Telemetry    Sinus rhythm with ventricular rate of 60-70s, artifacts  - Personally Reviewed  ECG    No new tracing this AM - Personally Reviewed  Cardiac Studies   Echo 09/22/20:  1. Apex well-visualized without contrast - false tendon (normal variant)  - no thrombus. Left ventricular ejection fraction, by estimation, is 35 to  40%. The left ventricle has moderately decreased function. The left  ventricle demonstrates  regional wall  motion abnormalities (see scoring diagram/findings for description). There  is mild left ventricular hypertrophy. Left ventricular diastolic  parameters are consistent with Grade I diastolic dysfunction (impaired  relaxation). Elevated left ventricular  end-diastolic pressure. There is severe hypokinesis of the left  ventricular, entire anterior wall, anteroseptal wall, apical segment and  inferoapical segment. Findings suggest LAD territory ischemia/infarct.   2. Right ventricular systolic function is normal. The right ventricular  size is normal. There is normal pulmonary artery systolic pressure.   3. The mitral valve is abnormal. Mild mitral valve regurgitation.   4. The aortic valve is tricuspid. Aortic valve regurgitation is not  visualized.   5. The inferior vena cava is normal in size with <50% respiratory  variability, suggesting right atrial pressure of 8 mmHg.   LHC 09/22/20: RPDA lesion is 50% stenosed. Dist Cx lesion is 30% stenosed with 30% stenosed side branch in LPAV. 1st Diag lesion is 60% stenosed. Mid LAD lesion is 100% stenosed. Post intervention, there is a 0% residual stenosis. A drug-eluting stent was successfully placed using a STENT ONYX FRONTIER 2.5X38. LV end diastolic pressure is mildly elevated.   1. 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 Mildly elevated LVEDP 21 mmHg 3. Successful PCI of the mid LAD with DES x 1   Diagnostic Dominance: Co-dominant      Intervention          Echocardiogram  09/28/20: 1. Left ventricular ejection fraction, by estimation, is 30 to 35%. The  left ventricle has moderately decreased function. The left ventricle  demonstrates regional wall motion abnormalities (see scoring  diagram/findings for description). There is mild  left ventricular hypertrophy. Left ventricular diastolic parameters are  consistent with Grade II diastolic dysfunction (pseudonormalization).  Elevated left atrial  pressure.   2. No LV thrombus seen   3. Right ventricular systolic function is normal. The right ventricular  size is normal. Tricuspid regurgitation signal is inadequate for assessing  PA pressure.   4. The mitral valve is normal in structure. Trivial mitral valve  regurgitation.   5. The aortic valve was not well visualized. Aortic valve regurgitation  is not visualized. No aortic stenosis is present.   6. The inferior vena cava is normal in size with greater than 50%  respiratory variability, suggesting right atrial pressure of 3 mmHg.   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  Patient Profile     60 y.o. female with a PMH of recent STEMI s/p PCI/DES to LAD 09/21/20, ICM/Chronic combined CHF, HTN, HLD, DM type 2, who presented with left sided weakness, found to have an acute CVA.    Assessment & Plan    Acute ischemic CVA - Pt presented with left sided weakness which she reports starting 09/24/20 after returning home from a hospital stay for STEMI earlier that day. Found to have acute/subacute non-hemorrhagic strokes involving the bilateral cerebral and cerebellar hemispheres on MRI.  Neurology following and recommending permissive HTN with BP goal at 130-150. There is high suspicion for cardioembolic source. Echo this admission without LV thrombus. TEE 7/12 showed no left atrial/L atrial appendage thrombus.  -Continue DAPT with aspirin and brilinta -Continue statin - 30 days event monitor for cardiac monitor of A fib at DC - not qualified for CIR, difficult to place, IM managing    Recent STEMI CAD -Presented with anterior STEMI-delayed presentation 09/21/20 and underwent LHC with PCI/DES to LAD. She had an ICM with EF  35-40% with anterior hypokinesis but no apical thrombus at that time. -Denies chest pain this admission, trop down from 24000 [phone] (from recent MI)  -Continue medial therapy with aspirin and brilinta; Crestor, and metoprolol     ICM/Chronic combined CHF:  -Echo 09/22/20 at the time of her STEMI showed EF 35-40% with severe hypokinesis of the anterior wall, G1DD, mild LVH, and mild MR.  -She was started on carvedilol, entresto, and spironolactone post MI with patient reports of orthopnea and SOB on this admission.  -BNP 724>>> from 600s 1 week prior  -CXR clear lungs with no acute findings.  -Echo 09/28/20 with EF 30-35%, G2DD, ongoing WMA, and no evidence of LV thrombus.  -Appears euvolemic  -Per neurology, "Given significance in posterior circulation vascular stenosis, recommend long-term BP goal 130-150." - GDMT has been on hold to allow for permissive hypertension; Beta blocker restarted 10/02/20>>transitioned to metoprolol succinate 25mg  to minimized hypotension; she is near BP goal range per neuro recommendation, will hold off on adding additional GDMT at this time   HLD:  -LDL 130 this admission>>recently started on low dose crestor 1 week ago which has been up titrated to 40mg  QD this admission -Will need repeat FLP/LFTs in 6-8 weeks   Carotid artery  stenosis:  -Found to have 70% left ICA stenosis on imaging this admission. Seen by Vascular surgery who states this is unlikely to be the etiology of her stroke given bilateral infarcts in both cerebellar and cerebral regions. -Plan is for outpatient follow-up with Vascular Surgery with repeat carotid dopplers in 3 months. -Continue aspirin and statin  CKD IIIb Type 2 DM Tobacco abuse Urinary retention Constipation: started colace today  - managed per IM    For questions or updates, please contact   Please consult [email or web address] for contact info under Cardiology/STEMI.  History and all data above reviewed.  Patient examined.  I agree  with the findings as above.  The patient exam reveals COR:RRR  ,  Lungs: Clear  ,  Abd: Positive bowel sounds, no rebound no guarding , Ext No edema  .  All available labs, radiology testing, previous records reviewed. Agree with documented assessment and plan.    CAD:  Continue current meds doing well.  Unable to titrate as we are trying not to cause hypotension with her posterior circulation vascular stenosis.    Jeneen Rinks Joannie Medine  11:18 AM  10/04/2020

## 2020-10-04 NOTE — Progress Notes (Signed)
PROGRESS NOTE    Tracey Meyer  YKZ:[id number] DOB: 08/03/1960 DOA: 09/27/2020 PCP: Pcp, No (  Chief Complaint  Patient presents with   Code STEMI   Brief Narrative:  Tracey Meyer is Tracey Meyer 60 y/o F with CAD (recent stent to the LAD on 7/4), ischemic cardiomyopathy with EF of 35-40%, HTN, DM2, HLD and smoking who presented to the ED with shortness of breath and orthopnea. She also stated that on the evening of 7/4, she developed left sided weakness and slurred speech which did not improve.   In ED: CT head> 18 x 16 x 17 mm focus of peripheral hypo attenuation in the posterior right parietooccipital region. This is nonspecific and could be related to subacute infarct, but given the somewhat rounded configuration, MRI of the brain without and with contrast recommended to further evaluate.   Cardiology and neurology consulted. IV Lasix started and MRI/ MRA ordered. Admitted to treat acute CHF and work up neurological symptoms.  Assessment & Plan:   Principal Problem:   Acute ischemic stroke Tracey Meyer) Active Problems:   HTN (hypertension)   Hyperlipidemia   STEMI involving left anterior descending coronary artery (HCC)   Tobacco abuse   Cardiomyopathy, ischemic   S/P angioplasty with stent to mLAD 09/22/20    DM (diabetes mellitus), type 2 (Tracey Meyer)   HFrEF (heart failure with reduced ejection fraction) (HCC)   CKD (chronic kidney disease), stage III (Tracey Meyer)  Acute ischemic strokes  - per neurology, likely cardioembolic related to cardiomyopathy with low EF  - symptoms occurred on the night of 7/4 after she got home from the hospital - MRI brain with multiple scattered acute to early subacute ischemic nonhemorrhagic infarcts involving bilateral cerebral and cerebellar hemispheres with largest area of infarction measuring up to 2 cm at R occipital cortex - central thromboembolic etiology suspected.  Underlying moderate chronic microvascular disease with Tracey Meyer few scattered remote lacunar  infarcts involving the L thalamus and pons. - MRA head technically limited due to motion artifact, negative for LVO.  Intracranial atherosclerotic disease with associated moderate stneoses involving the distal L V4 segment, distal basilar artery, and carvernous R ICA.  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 MRA neck limited due to motion artifact, 70% stenosis at origin of L ICA.  Otherwise negative MRA of neck. - LDL is 130-Crestor started after MI-  increased from 10 mg this admission to 40 mg daily - A1c is 6.8 - 2D echo does not reveal Tracey Meyer thrombus   - TEE with no left atrial/L atrial appendage thrombus.  Positive intrapulmonary shunting. - Neurology recommending to continue 81 mg aspirin and Brilinta per cards recs - needs 30 day event monitor to r/o atrial fib - SLP eval done- mild aspiration risk- regular diet with thin liquid recommended and MBS ordered - SLUMs is 17/30 - 7/11> - MBS completed and SLP recommending regular diet and thin liquids,   - CIR has signed off today as patient has no one to assist her at home- her husband states he is disabled and cannot help. likely difficult to place patient, will follow TOC, appreciate assistance.  Left ICA stenosis - MRA>> Approximate 70% atheromatous stenosis at the origin of the left ICA - Spoke with vascular surgery. Dr Carlis Abbott to get an appt so she does not fall through the cracks - CTA today- no vascular stenosis- Neuro communicated with vascular   Chronic systolic heart failure (mild) CAD with NSTEMI and stent in  mid LAD on 09/22/2020 -Echo 09/22/2020> EF 35 to 40%, mild LVH, grade 1 diastolic dysfunction, severe hypokinesis of the LV entire anterior wall anteroseptal wall apical segment and inferior apical segment. -Echo 09/28/2020> EF 30 to 01%, grade 2 diastolic dysfunction -Given 20 mg of IV Lasix on 7/7 and no diuretics since - Continue aspirin, Brilinta, Crestor - metoprolol, at this time, can likely gradually  bring BP into normal range -Cardiology is following   Hypertension - continue metop - gradually normalize BP - long term goal 130-150 given posterior circulation vascular stenosis per neuro  Urinary retention - 7/8 Patient had the urge to urinate but was unable to-bladder scan revealed 700 cc- In-N-Out cath done and 600 cc of urine obtained  - no further I and O caths needed   Small amounts of pink discharge in bed pan  - follow - w/u further as indicated - UA from 7/8 with no RBC's   CKD 3B - Cr has mostly been ~ 1.0-1.2  in hospital - Cr was 0.80 on 7/2     DM (diabetes mellitus), type 2 (Tracey Meyer) -On metformin at home -A1c 6.8 -SSI  Tobacco abuse - Smoking cessation counseling given  DVT prophylaxis: lovenox Code Status: full  Family Communication: none at bedside Disposition:   Status is: Inpatient  Remains inpatient appropriate because:Inpatient level of care appropriate due to severity of illness  Dispo: The patient is from: Home              Anticipated d/c is to: Home              Patient currently is not medically stable to d/c.   Difficult to place patient No       Consultants:  Cardiology neurology  Procedures: Echo (TEE) IMPRESSIONS     1. Akinesis of the anteroseptal wall and apex with overall moderate to  severe LV dysfunction; no obvious apical thrombus noted using definity.   2. Left ventricular ejection fraction, by estimation, is 30 to 35%. The  left ventricle has moderate to severely decreased function. The left  ventricle demonstrates regional wall motion abnormalities (see scoring  diagram/findings for description). The  left ventricular internal cavity size was mildly dilated.   3. Right ventricular systolic function is normal. The right ventricular  size is normal.   4. Left atrial size was mildly dilated. No left atrial/left atrial  appendage thrombus was detected.   5. Tracey Meyer small pericardial effusion is present.   6. The mitral valve  is normal in structure. Mild mitral valve  regurgitation.   7. The aortic valve is tricuspid. Aortic valve regurgitation is not  visualized.   8. There is mild (Grade II) plaque involving the descending aorta.   9. Agitated saline contrast bubble study was positive with shunting  observed after >6 cardiac cycles suggestive of intrapulmonary shunting.   Echo IMPRESSIONS     1. Left ventricular ejection fraction, by estimation, is 30 to 35%. The  left ventricle has moderately decreased function. The left ventricle  demonstrates regional wall motion abnormalities (see scoring  diagram/findings for description). There is mild  left ventricular hypertrophy. Left ventricular diastolic parameters are  consistent with Grade II diastolic dysfunction (pseudonormalization).  Elevated left atrial pressure.   2. No LV thrombus seen   3. Right ventricular systolic function is normal. The right ventricular  size is normal. Tricuspid regurgitation signal is inadequate for assessing  PA pressure.   4. The mitral valve is normal in structure.  Trivial mitral valve  regurgitation.   5. The aortic valve was not well visualized. Aortic valve regurgitation  is not visualized. No aortic stenosis is present.   6. The inferior vena cava is normal in size with greater than 50%  respiratory variability, suggesting right atrial pressure of 3 mmHg.    Antimicrobials:  Anti-infectives (From admission, onward)    None        Subjective: Denies any new complaints  Objective: Vitals:   10/03/20 2008 10/03/20 2340 10/04/20 0502 10/04/20 0814  BP: (!) 155/76 124/72 127/62 (!) 149/84  Pulse: 79 78 71 79  Resp: 16 (!) 21 15 (!) 22  Temp: 98.8 F (37.1 C) 97.8 F (36.6 C) 97.8 F (36.6 C) 98 F (36.7 C)  TempSrc: Oral Oral Oral Oral  SpO2: 100% 97% 99% 98%  Weight:      Height:       No intake or output data in the 24 hours ending 10/04/20 0928 Filed Weights   09/30/20 0438 10/01/20 0500  10/02/20 0326  Weight: 69.5 kg 69 kg 70.1 kg    Examination:  General: No acute distress. Cardiovascular: RRR Lungs: unlabored Abdomen: Soft, nontender, nondistended  Neurological: L sided weakness, antigravity Skin: Warm and dry. No rashes or lesions. Extremities: No clubbing or cyanosis. No edema.    Data Reviewed: I have personally reviewed following labs and imaging studies  CBC: Recent Labs  Lab 09/27/20 1900 09/27/20 1950 10/03/20 0821 10/04/20 0058  WBC 6.9  --  9.6 8.4  NEUTROABS 4.9  --  8.0* 6.2  HGB 12.1 12.2 11.8* 11.3*  HCT 36.4 36.0 36.0 34.4*  MCV 94.3  --  94.7 94.0  PLT 352  --  420* 546    Basic Metabolic Panel: Recent Labs  Lab 09/27/20 1900 09/27/20 1950 09/29/20 0912 10/03/20 0821 10/04/20 0058  NA 134* 138 135 133* 135  K 3.8 3.8 4.1 4.0 4.3  CL 104 108 104 104 104  CO2 18*  --  22 21* 23  GLUCOSE 205* 201* 145* 140* 146*  BUN 26* 26* 24* 18 18  CREATININE 1.30* 1.20* 1.10* 1.10* 1.13*  CALCIUM 9.7  --  9.6 9.4 9.3  MG  --   --   --   --  2.0  PHOS  --   --   --   --  3.2    GFR: Estimated Creatinine Clearance: 51.5 mL/min (Marciano Mundt) (by C-G formula based on SCr of 1.13 mg/dL (H)).  Liver Function Tests: Recent Labs  Lab 09/27/20 1900 10/03/20 0821 10/04/20 0058  AST 28 16 14*  ALT 26 15 14   ALKPHOS 47 55 55  BILITOT 0.5 0.6 0.6  PROT 6.8 7.0 6.5  ALBUMIN 3.0* 2.7* 2.6*    CBG: Recent Labs  Lab 10/03/20 0738 10/03/20 1101 10/03/20 1601 10/03/20 2016 10/04/20 0824  GLUCAP 87 175* 142* 160* 181*     Recent Results (from the past 240 hour(s))  Resp Panel by RT-PCR (Flu Amiria Orrison&B, Covid) Nasopharyngeal Swab     Status: None   Collection Time: 09/27/20  6:54 PM   Specimen: Nasopharyngeal Swab; Nasopharyngeal(NP) swabs in vial transport medium  Result Value Ref Range Status   SARS Coronavirus 2 by RT PCR NEGATIVE NEGATIVE Final    Comment: (NOTE) SARS-CoV-2 target nucleic acids are NOT DETECTED.  The SARS-CoV-2 RNA is  generally detectable in upper respiratory specimens during the acute phase of infection. The lowest concentration of SARS-CoV-2 viral copies this assay can detect  is 138 copies/mL. Jada Fass negative result does not preclude SARS-Cov-2 infection and should not be used as the sole basis for treatment or other patient management decisions. Makylah Bossard negative result may occur with  improper specimen collection/handling, submission of specimen other than nasopharyngeal swab, presence of viral mutation(s) within the areas targeted by this assay, and inadequate number of viral copies(<138 copies/mL). Shaqueena Mauceri negative result must be combined with clinical observations, patient history, and epidemiological information. The expected result is Negative.  Fact Sheet for Patients:  EntrepreneurPulse.com.au  Fact Sheet for Healthcare Providers:  IncredibleEmployment.be  This test is no t yet approved or cleared by the [place] FDA and  has been authorized for detection and/or diagnosis of SARS-CoV-2 by FDA under an Emergency Use Authorization (EUA). This EUA will remain  in effect (meaning this test can be used) for the duration of the COVID-19 declaration under Section 564(b)(1) of the Act, 21 U.S.C.section 360bbb-3(b)(1), unless the authorization is terminated  or revoked sooner.       Influenza Hayli Milligan by PCR NEGATIVE NEGATIVE Final   Influenza B by PCR NEGATIVE NEGATIVE Final    Comment: (NOTE) The Xpert Xpress SARS-CoV-2/FLU/RSV plus assay is intended as an aid in the diagnosis of influenza from Nasopharyngeal swab specimens and should not be used as Melinna Linarez sole basis for treatment. Nasal washings and aspirates are unacceptable for Xpert Xpress SARS-CoV-2/FLU/RSV testing.  Fact Sheet for Patients: EntrepreneurPulse.com.au  Fact Sheet for Healthcare Providers: IncredibleEmployment.be  This test is not yet approved or cleared by the [place] FDA and has been authorized for detection and/or diagnosis of SARS-CoV-2 by FDA under an Emergency Use Authorization (EUA). This EUA will remain in effect (meaning this test can be used) for the duration of the COVID-19 declaration under Section 564(b)(1) of the Act, 21 U.S.C. section 360bbb-3(b)(1), unless the authorization is terminated or revoked.  Performed at [facility], [place] [address], [place], [place] [address]          Radiology Studies: No results found.      Scheduled Meds:  aspirin EC  81 mg Oral Daily   docusate sodium  100 mg Oral Daily   enoxaparin (LOVENOX) injection  40 mg Subcutaneous Q24H   famotidine  20 mg Oral Daily   insulin aspart  0-5 Units Subcutaneous QHS   insulin aspart  0-9 Units Subcutaneous TID WC   metoprolol succinate  25 mg Oral Daily   rosuvastatin  40 mg Oral Daily   ticagrelor  90 mg Oral BID   Continuous Infusions:   LOS: 7 days    Time spent: over 30 min    Fayrene Helper, MD [facility]   To contact the attending provider between 7A-7P or the covering provider during after hours 7P-7A, please log into the web site [email or web address] and access using universal [place] password for that web site. If you do not have the password, please call the hospital operator.  10/04/2020, 9:28 AM

## 2020-10-04 NOTE — Progress Notes (Signed)
Physical Therapy Treatment Patient Details Name: Tracey Meyer MRN: [id number] DOB: 10-24-1960 Today's Date: 10/04/2020    History of Present Illness 60 yo female presents to [facility] on 7/7 with L weakness, shortness of breath that started on 7/4. 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 CTH specifically shows focus of peripheral hypoattenuation in posterior R parietooccipital lobe. PMH significant for DM2, HTN, ischemic cardiomyopathy EF 35-40%, tobacco use and trying to quit, HLD, breast cancer with L radical mastectomy, CAD s/p angio and stenting of mLAD d/c 7/3.    PT Comments    Pt was seen for mobility in bed, having been up in chair and declining to be out of bed right now.  Talked with her about attending to her L side, repositioned LUE on a pillow to make her arm more visible and elevate it.  LLE was assisted to exercise, noting more active movement now.  RLE actively exercising, and pt is discussing how important moving and getting better is to her.  Follow along with her to increase standing balance and endurance, and work with her to encourage more active WB through LLE and active use.  Follow Up Recommendations  SNF     Equipment Recommendations  None recommended by PT (wait for follow up with rehab setting)    Recommendations for Other Services       Precautions / Restrictions Precautions Precautions: Fall Precaution Comments: L hemiplegia Restrictions Weight Bearing Restrictions: No    Mobility  Bed Mobility Overal bed mobility: Needs Assistance Bed Mobility:  (scooting up and repositioning)           General bed mobility comments: requires mod assist to move legs to work on scooting up    Transfers                 General transfer comment: declined  Ambulation/Gait                 Marine scientist [place] (Stroke  Patients Only)       Balance Overall balance assessment: Needs assistance                                          Cognition Arousal/Alertness: Awake/alert Behavior During Therapy: WFL for tasks assessed/performed Overall Cognitive Status: Impaired/Different from baseline Area of Impairment: Following commands;Attention                   Current Attention Level: Selective   Following Commands: Follows one step commands with increased time Safety/Judgement: Decreased awareness of deficits   Problem Solving: Requires verbal cues;Requires tactile cues        Exercises General Exercises - Lower Extremity Ankle Circles/Pumps: AAROM;AROM;5 reps Quad Sets: AROM;5 reps Gluteal Sets: AROM;10 reps Long Arc Quad: AROM;AAROM;10 reps Heel Slides: AAROM;AROM;10 reps Hip ABduction/ADduction: AROM;AAROM;10 reps Straight Leg Raises: AROM;AAROM;10 reps    General Comments General comments (skin integrity, edema, etc.): pt is weak on L side, but more mobile on LLE than UE. DF requires hip flexion posture on LLE, but can extend knee with extra time from flexed hip posture      Pertinent Vitals/Pain Pain Assessment: No/denies pain    Home Living  Prior Function            PT Goals (current goals can now be found in the care plan section) Acute Rehab PT Goals Patient Stated Goal: go to rehab and go home    Frequency    Min 4X/week      PT Plan Current plan remains appropriate    Co-evaluation              AM-PAC PT "6 Clicks" Mobility   Outcome Measure  Help needed turning from your back to your side while in a flat bed without using bedrails?: A Little Help needed moving from lying on your back to sitting on the side of a flat bed without using bedrails?: A Little Help needed moving to and from a bed to a chair (including a wheelchair)?: A Lot Help needed standing up from a chair using your arms (e.g.,  wheelchair or bedside chair)?: A Lot Help needed to walk in hospital room?: Total Help needed climbing 3-5 steps with a railing? : Total 6 Click Score: 12    End of Session   Activity Tolerance: Patient tolerated treatment well Patient left: in bed;with call bell/phone within reach;with bed alarm set Nurse Communication: Mobility status;Need for lift equipment PT Visit Diagnosis: Other abnormalities of gait and mobility (R26.89);Hemiplegia and hemiparesis Hemiplegia - Right/Left: Left Hemiplegia - dominant/non-dominant: Non-dominant Hemiplegia - caused by: Cerebral infarction     Time: 1561-5379 PT Time Calculation (min) (ACUTE ONLY): 26 min  Charges:  $Therapeutic Exercise: 8-22 mins $Therapeutic Activity: 8-22 mins                 Ramond Dial 10/04/2020, 1:55 PM  Mee Hives, PT MS Acute Rehab Dept. Number: [place]

## 2020-10-05 ENCOUNTER — Telehealth: Payer: Self-pay

## 2020-10-05 LAB — CBC WITH DIFFERENTIAL/PLATELET
Abs Immature Granulocytes: 0.04 10*3/uL (ref 0.00–0.07)
Basophils Absolute: 0 10*3/uL (ref 0.0–0.1)
Basophils Relative: 1 %
Eosinophils Absolute: 0.3 10*3/uL (ref 0.0–0.5)
Eosinophils Relative: 4 %
HCT: 35.1 % — ABNORMAL LOW (ref 36.0–46.0)
Hemoglobin: 11.5 g/dL — ABNORMAL LOW (ref 12.0–15.0)
Immature Granulocytes: 1 %
Lymphocytes Relative: 21 %
Lymphs Abs: 1.6 10*3/uL (ref 0.7–4.0)
MCH: 30.5 pg (ref 26.0–34.0)
MCHC: 32.8 g/dL (ref 30.0–36.0)
MCV: 93.1 fL (ref 80.0–100.0)
Monocytes Absolute: 0.7 10*3/uL (ref 0.1–1.0)
Monocytes Relative: 9 %
Neutro Abs: 5 10*3/uL (ref 1.7–7.7)
Neutrophils Relative %: 64 %
Platelets: 418 10*3/uL — ABNORMAL HIGH (ref 150–400)
RBC: 3.77 MIL/uL — ABNORMAL LOW (ref 3.87–5.11)
RDW: 12.7 % (ref 11.5–15.5)
WBC: 7.7 10*3/uL (ref 4.0–10.5)
nRBC: 0 % (ref 0.0–0.2)

## 2020-10-05 LAB — COMPREHENSIVE METABOLIC PANEL
ALT: 17 U/L (ref 0–44)
AST: 16 U/L (ref 15–41)
Albumin: 2.7 g/dL — ABNORMAL LOW (ref 3.5–5.0)
Alkaline Phosphatase: 54 U/L (ref 38–126)
Anion gap: 7 (ref 5–15)
BUN: 17 mg/dL (ref 6–20)
CO2: 21 mmol/L — ABNORMAL LOW (ref 22–32)
Calcium: 9.6 mg/dL (ref 8.9–10.3)
Chloride: 106 mmol/L (ref 98–111)
Creatinine, Ser: 1.06 mg/dL — ABNORMAL HIGH (ref 0.44–1.00)
GFR, Estimated: >60 mL/min (ref >60)
Glucose, Bld: 138 mg/dL — ABNORMAL HIGH (ref 70–99)
Potassium: 4.1 mmol/L (ref 3.5–5.1)
Sodium: 134 mmol/L — ABNORMAL LOW (ref 135–145)
Total Bilirubin: 0.5 mg/dL (ref 0.3–1.2)
Total Protein: 6.7 g/dL (ref 6.5–8.1)

## 2020-10-05 LAB — PHOSPHORUS: Phosphorus: 3.6 mg/dL (ref 2.5–4.6)

## 2020-10-05 LAB — MAGNESIUM: Magnesium: 2.1 mg/dL (ref 1.7–2.4)

## 2020-10-05 LAB — GLUCOSE, CAPILLARY
Glucose-Capillary: 196 mg/dL — ABNORMAL HIGH (ref 70–99)
Glucose-Capillary: 182 mg/dL — ABNORMAL HIGH (ref 70–99)
Glucose-Capillary: 174 mg/dL — ABNORMAL HIGH (ref 70–99)

## 2020-10-05 NOTE — Progress Notes (Addendum)
Progress Note  Patient Name: Tracey Meyer Date of Encounter: 10/05/2020  Primary Cardiologist: Peter Martinique, MD   Subjective   Patient states she is doing OK, denied chest pain and SOB. She is trying PT daily and hoping to re-gain strength of her left arm and leg.  Inpatient Medications    Scheduled Meds:  aspirin EC  81 mg Oral Daily   docusate sodium  100 mg Oral Daily   enoxaparin (LOVENOX) injection  40 mg Subcutaneous Q24H   famotidine  20 mg Oral Daily   insulin aspart  0-5 Units Subcutaneous QHS   insulin aspart  0-9 Units Subcutaneous TID WC   metoprolol succinate  25 mg Oral Daily   rosuvastatin  40 mg Oral Daily   ticagrelor  90 mg Oral BID   Continuous Infusions:  PRN Meds: acetaminophen **OR** acetaminophen (TYLENOL) oral liquid 160 mg/5 mL **OR** acetaminophen, alum & mag hydroxide-simeth   Vital Signs    Vitals:   10/04/20 1200 10/04/20 1343 10/04/20 2105 10/05/20 0616  BP: 132/80 140/68 (!) 143/81 139/70  Pulse: 62 75 74 70  Resp: 15 18 16 17   Temp: 97.7 F (36.5 C) 98.2 F (36.8 C) 98.3 F (36.8 C) 98 F (36.7 C)  TempSrc: Oral Oral Oral Oral  SpO2: 98%  100% 99%  Weight:      Height:        Intake/Output Summary (Last 24 hours) at 10/05/2020 0704 Last data filed at 10/04/2020 1300 Gross per 24 hour  Intake 480 ml  Output 250 ml  Net 230 ml    Filed Weights   09/30/20 0438 10/01/20 0500 10/02/20 0326  Weight: 69.5 kg 69 kg 70.1 kg    Physical Exam   General: Well developed, well nourished Neck: No JVD Lungs:Clear to ausculation bilaterally. No wheezes, rales, or rhonchi. Breathing is unlabored. Cardiovascular: RRR with S1 S2. No murmurs MSK: left sided hemiparesis  Extremities: No BLE edema. Neuro: Alert and oriented x3, able to follow commands appopriately. Psych: calm and cooperative    Labs    Chemistry Recent Labs  Lab 10/03/20 0821 10/04/20 0058 10/05/20 0207  NA 133* 135 134*  K 4.0 4.3 4.1  CL 104 104  106  CO2 21* 23 21*  GLUCOSE 140* 146* 138*  BUN 18 18 17   CREATININE 1.10* 1.13* 1.06*  CALCIUM 9.4 9.3 9.6  PROT 7.0 6.5 6.7  ALBUMIN 2.7* 2.6* 2.7*  AST 16 14* 16  ALT 15 14 17   ALKPHOS 55 55 54  BILITOT 0.6 0.6 0.5  GFRNONAA 58* 56* >60  ANIONGAP 8 8 7       Hematology Recent Labs  Lab 10/03/20 0821 10/04/20 0058 10/05/20 0207  WBC 9.6 8.4 7.7  RBC 3.80* 3.66* 3.77*  HGB 11.8* 11.3* 11.5*  HCT 36.0 34.4* 35.1*  MCV 94.7 94.0 93.1  MCH 31.1 30.9 30.5  MCHC 32.8 32.8 32.8  RDW 12.8 12.6 12.7  PLT 420* 393 418*     Cardiac EnzymesNo results for input(s): TROPONINI in the last 168 hours. No results for input(s): TROPIPOC in the last 168 hours.   BNP No results for input(s): BNP, PROBNP in the last 168 hours.    DDimer No results for input(s): DDIMER in the last 168 hours.   Radiology    No results found.  Telemetry    Sinus rhythm with rate of 70-80s, artifacts   - Personally Reviewed  ECG    No new tracing this  AM - Personally Reviewed  Cardiac Studies   Echo 09/22/20:  1. Apex well-visualized without contrast - false tendon (normal variant)  - no thrombus. Left ventricular ejection fraction, by estimation, is 35 to  40%. The left ventricle has moderately decreased function. The left  ventricle demonstrates regional wall  motion abnormalities (see scoring diagram/findings for description). There  is mild left ventricular hypertrophy. Left ventricular diastolic  parameters are consistent with Grade I diastolic dysfunction (impaired  relaxation). Elevated left ventricular  end-diastolic pressure. There is severe hypokinesis of the left  ventricular, entire anterior wall, anteroseptal wall, apical segment and  inferoapical segment. Findings suggest LAD territory ischemia/infarct.   2. Right ventricular systolic function is normal. The right ventricular  size is normal. There is normal pulmonary artery systolic pressure.   3. The mitral valve is  abnormal. Mild mitral valve regurgitation.   4. The aortic valve is tricuspid. Aortic valve regurgitation is not  visualized.   5. The inferior vena cava is normal in size with <50% respiratory  variability, suggesting right atrial pressure of 8 mmHg.   LHC 09/22/20: RPDA lesion is 50% stenosed. Dist Cx lesion is 30% stenosed with 30% stenosed side branch in LPAV. 1st Diag lesion is 60% stenosed. Mid LAD lesion is 100% stenosed. Post intervention, there is a 0% residual stenosis. A drug-eluting stent was successfully placed using a STENT ONYX FRONTIER [id number]. LV end diastolic pressure is mildly elevated.   1. 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 Mildly elevated LVEDP 21 mmHg 3. Successful PCI of the mid LAD with DES x 1   Diagnostic Dominance: Co-dominant      Intervention          Echocardiogram 09/28/20: 1. Left ventricular ejection fraction, by estimation, is 30 to 35%. The  left ventricle has moderately decreased function. The left ventricle  demonstrates regional wall motion abnormalities (see scoring  diagram/findings for description). There is mild  left ventricular hypertrophy. Left ventricular diastolic parameters are  consistent with Grade II diastolic dysfunction (pseudonormalization).  Elevated left atrial pressure.   2. No LV thrombus seen   3. Right ventricular systolic function is normal. The right ventricular  size is normal. Tricuspid regurgitation signal is inadequate for assessing  PA pressure.   4. The mitral valve is normal in structure. Trivial mitral valve  regurgitation.   5. The aortic valve was not well visualized. Aortic valve regurgitation  is not visualized. No aortic stenosis is present.   6. The inferior vena cava is normal in size with greater than 50%  respiratory variability, suggesting right atrial pressure of 3 mmHg.   TEE 10/02/20: prelim report Akinesis of the anteroseptal wall and apex; overall moderate to  severe LV dysfunction; no obvious apical thrombus noted using definity; moderate LAE; no LAA thrombus; small pericardial effusion; mild MR; late positive saline microcavitation study suggestive of intrapulmonary shunt.  Patient Profile     60 y.o. female with a PMH of recent STEMI s/p PCI/DES to LAD 09/21/20, ICM/Chronic combined CHF, HTN, HLD, DM type 2, who presented with left sided weakness, found to have an acute CVA. Cardiology following for chronic CHF and recent STEMI.    Assessment & Plan    Acute ischemic CVA - Pt presented with left sided weakness which she reports starting 09/24/20 after returning home from a hospital stay for STEMI earlier that day. Found to have acute/subacute non-hemorrhagic strokes involving the bilateral cerebral and cerebellar hemispheres on MRI.  Neurology  following and recommending permissive HTN with BP goal at 130-150. There is high suspicion for cardioembolic source. Echo this admission without LV thrombus. TEE 7/12 showed no left atrial/L atrial appendage thrombus.  -Continue DAPT with aspirin and brilinta -Continue statin - Neuro recommend 30 days event monitor for cardiac monitor of A fib at discharge  - not qualified for CIR, difficult to placement with SNF, IM managing    Recent STEMI 09/21/20  CAD -Presented with anterior STEMI-delayed presentation on 09/21/20 and underwent LHC with PCI/DES to LAD. She had an ICM with EF 35-40% with anterior hypokinesis but no apical thrombus at that time. -Denies chest pain this admission, trop down from 24000 [phone] (from recent MI)  -Continue medial therapy with aspirin and brilinta; Crestor, and metoprolol     ICM/Chronic combined CHF -Echo 09/22/20 at the time of her STEMI showed EF 35-40% with severe hypokinesis of the anterior wall, G1DD, mild LVH, and mild MR.  -She was started on carvedilol, entresto, and spironolactone post MI with patient reports of orthopnea and SOB on that admission.  -BNP 724>>> from 600s 1  week prior  -CXR clear lungs with no acute findings.  -Echo 09/28/20 with EF 30-35%, G2DD, ongoing WMA, and no evidence of LV thrombus.  -Appears euvolemic clinically  -Per neurology, "Given significance in posterior circulation vascular stenosis, recommend long-term BP goal 130-150." - GDMT has been on hold to allow for permissive hypertension; Beta blocker restarted 10/02/20>>transitioned to metoprolol succinate 25mg  to minimized hypotension; she is near BP goal range per neuro recommendation, will hold off on adding additional GDMT at this time   HLD  -LDL 130 this admission>>recently started on low dose crestor 1 week ago which has been up titrated to 40mg  QD this admission -Will need repeat FLP/LFTs in 6-8 weeks   Carotid artery stenosis:  -Found to have 70% left ICA stenosis on imaging this admission. Seen by Vascular surgery who states this is unlikely to be the etiology of her stroke given bilateral infarcts in both cerebellar and cerebral regions. -Plan is for outpatient follow-up with Vascular Surgery with repeat carotid dopplers in 3 months. -Continue aspirin and statin  CKD IIIb Type 2 DM Tobacco abuse Urinary retention Constipation - managed per IM    For questions or updates, please contact   Please consult [email or web address] for contact info under Cardiology/STEMI.  Margie Billet  7:04 AM  10/05/2020  History and all data above reviewed.  Patient examined.  I agree with the findings as above.  She has no pain or SOB.  The patient exam reveals COR:RRR  ,  Lungs: Clear  ,  Abd: Positive bowel sounds, no rebound no guarding, Ext No edema  .  All available labs, radiology testing, previous records reviewed. Agree with documented assessment and plan.   CAD:  Medical management.  She could come off of telemetry.  No change in meds today although if her pressure creeps up I would consider increasing the beta blocker.  We will follow as needed.   Jeneen Rinks Anaaya Fuster  11:48 AM  10/05/2020

## 2020-10-05 NOTE — Telephone Encounter (Signed)
-----   Message from Gita Kudo, RN sent at 09/25/2020  7:53 AM EDT -----  ----- Message ----- From: Isaiah Serge, NP Sent: 09/24/2020  10:33 AM EDT To: Jenean Lindau, MD, [facility] Triage  Pt with STEMI 09/22/20 and discharged 09/24/20.  Stent to LAD and EF 35%.  Has only local transportation in [place] so could y'all see her in 7-14 days or so?   She is self pay she understands to call if has trouble obtaining meds.  You may need to change from Brilinta to plavix she was given 30 day free card.  Also for entresto.  Thanks Mickel Baas

## 2020-10-05 NOTE — Telephone Encounter (Signed)
Pt's family states that the pt is back in the hospital and she has had a heart attack and a stroke. Advised we will schedule once she is released.

## 2020-10-05 NOTE — Progress Notes (Signed)
PROGRESS NOTE    Tracey Meyer  OQH:[id number] DOB: [birth year] DOA: 09/27/2020 PCP: Pcp, No (  Chief Complaint  Patient presents with   Code STEMI   Brief Narrative:  Tracey Meyer is Tracey Meyer 60 y/o F with CAD (recent stent to the LAD on 7/4), ischemic cardiomyopathy with EF of 35-40%, HTN, DM2, HLD and smoking who presented to the ED with shortness of breath and orthopnea. She also stated that on the evening of 7/4, she developed left sided weakness and slurred speech which did not improve.   In ED: CT head> 18 x 16 x 17 mm focus of peripheral hypo attenuation in the posterior right parietooccipital region. This is nonspecific and could be related to subacute infarct, but given the somewhat rounded configuration, MRI of the brain without and with contrast recommended to further evaluate.   Cardiology and neurology consulted. IV Lasix started and MRI/ MRA ordered. Admitted to treat acute CHF and work up neurological symptoms.  Assessment & Plan:   Principal Problem:   Acute ischemic stroke [facility]) Active Problems:   HTN (hypertension)   Hyperlipidemia   STEMI involving left anterior descending coronary artery (HCC)   Tobacco abuse   Cardiomyopathy, ischemic   S/P angioplasty with stent to mLAD 09/22/20    DM (diabetes mellitus), type 2 ([place])   HFrEF (heart failure with reduced ejection fraction) (HCC)   CKD (chronic kidney disease), stage III ([place])  Acute ischemic strokes  - per neurology, likely cardioembolic related to cardiomyopathy with low EF  - symptoms occurred on the night of 7/4 after she got home from the hospital - MRI brain with multiple scattered acute to early subacute ischemic nonhemorrhagic infarcts involving bilateral cerebral and cerebellar hemispheres with largest area of infarction measuring up to 2 cm at R occipital cortex - central thromboembolic etiology suspected.  Underlying moderate chronic microvascular disease with Tracey Meyer few scattered remote lacunar  infarcts involving the L thalamus and pons. - MRA head technically limited due to motion artifact, negative for LVO.  Intracranial atherosclerotic disease with associated moderate stneoses involving the distal L V4 segment, distal basilar artery, and carvernous R ICA.  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 MRA neck limited due to motion artifact, 70% stenosis at origin of L ICA.  Otherwise negative MRA of neck. - LDL is 130-Crestor started after MI-  increased from 10 mg this admission to 40 mg daily - A1c is 6.8 - 2D echo does not reveal Tracey Meyer thrombus   - TEE with no left atrial/L atrial appendage thrombus.  Positive intrapulmonary shunting. - Neurology recommending to continue 81 mg aspirin and Brilinta per cards recs - needs 30 day event monitor to r/o atrial fib - SLP eval done- mild aspiration risk- regular diet with thin liquid recommended and MBS ordered - SLUMs is 17/30 - 7/11> - MBS completed and SLP recommending regular diet and thin liquids,   - CIR has signed off today as patient has no one to assist her at home- her husband states he is disabled and cannot help. likely difficult to place patient, will follow TOC, appreciate assistance.  Left ICA stenosis - MRA>> Approximate 70% atheromatous stenosis at the origin of the left ICA - Spoke with vascular surgery. Dr Carlis Abbott to get an appt so she does not fall through the cracks - CTA today- no vascular stenosis- Neuro communicated with vascular   Chronic systolic heart failure (mild) CAD with NSTEMI and stent in  mid LAD on 09/22/2020 -Echo 09/22/2020> EF 35 to 40%, mild LVH, grade 1 diastolic dysfunction, severe hypokinesis of the LV entire anterior wall anteroseptal wall apical segment and inferior apical segment. -Echo 09/28/2020> EF 30 to 41%, grade 2 diastolic dysfunction -Given 20 mg of IV Lasix on 7/7 and no diuretics since - Continue aspirin, Brilinta, Crestor - metoprolol, at this time, can likely gradually  bring BP into normal range -Cardiology is following   Hypertension - continue metop - gradually normalize BP - long term goal 130-150 given posterior circulation vascular stenosis per neuro  Urinary retention - 7/8 Patient had the urge to urinate but was unable to-bladder scan revealed 700 cc- In-N-Out cath done and 600 cc of urine obtained  - no further I and O caths needed   Small amounts of pink discharge in bed pan  - follow - w/u further as indicated - UA from 7/8 with no RBC's   CKD 3B - Cr has mostly been ~ 1.0-1.2  in hospital - Cr was 0.80 on 7/2     DM (diabetes mellitus), type 2 ([place]) -On metformin at home -A1c 6.8 -SSI  Tobacco abuse - Smoking cessation counseling given  DVT prophylaxis: lovenox Code Status: full  Family Communication: none at bedside Disposition:   Status is: Inpatient  Remains inpatient appropriate because:Inpatient level of care appropriate due to severity of illness  Dispo: The patient is from: Home              Anticipated d/c is to: Home              Patient currently is not medically stable to d/c.   Difficult to place patient No       Consultants:  Cardiology neurology  Procedures: Echo (TEE) IMPRESSIONS     1. Akinesis of the anteroseptal wall and apex with overall moderate to  severe LV dysfunction; no obvious apical thrombus noted using definity.   2. Left ventricular ejection fraction, by estimation, is 30 to 35%. The  left ventricle has moderate to severely decreased function. The left  ventricle demonstrates regional wall motion abnormalities (see scoring  diagram/findings for description). The  left ventricular internal cavity size was mildly dilated.   3. Right ventricular systolic function is normal. The right ventricular  size is normal.   4. Left atrial size was mildly dilated. No left atrial/left atrial  appendage thrombus was detected.   5. Tracey Meyer small pericardial effusion is present.   6. The mitral valve  is normal in structure. Mild mitral valve  regurgitation.   7. The aortic valve is tricuspid. Aortic valve regurgitation is not  visualized.   8. There is mild (Grade II) plaque involving the descending aorta.   9. Agitated saline contrast bubble study was positive with shunting  observed after >6 cardiac cycles suggestive of intrapulmonary shunting.   Echo IMPRESSIONS     1. Left ventricular ejection fraction, by estimation, is 30 to 35%. The  left ventricle has moderately decreased function. The left ventricle  demonstrates regional wall motion abnormalities (see scoring  diagram/findings for description). There is mild  left ventricular hypertrophy. Left ventricular diastolic parameters are  consistent with Grade II diastolic dysfunction (pseudonormalization).  Elevated left atrial pressure.   2. No LV thrombus seen   3. Right ventricular systolic function is normal. The right ventricular  size is normal. Tricuspid regurgitation signal is inadequate for assessing  PA pressure.   4. The mitral valve is normal in structure.  Trivial mitral valve  regurgitation.   5. The aortic valve was not well visualized. Aortic valve regurgitation  is not visualized. No aortic stenosis is present.   6. The inferior vena cava is normal in size with greater than 50%  respiratory variability, suggesting right atrial pressure of 3 mmHg.    Antimicrobials:  Anti-infectives (From admission, onward)    None        Subjective: No new complaints  Objective: Vitals:   10/05/20 0616 10/05/20 0830 10/05/20 1105 10/05/20 1535  BP: 139/70 (!) 135/100 140/80 129/72  Pulse: 70 77 63 71  Resp: 17 20 20 20   Temp: 98 F (36.7 C) 97.7 F (36.5 C) 97.9 F (36.6 C) 97.6 F (36.4 C)  TempSrc: Oral Oral Oral Oral  SpO2: 99% 100% 100% 98%  Weight:      Height:       No intake or output data in the 24 hours ending 10/05/20 1715 Filed Weights   09/30/20 0438 10/01/20 0500 10/02/20 0326  Weight:  69.5 kg 69 kg 70.1 kg    Examination:  General: No acute distress. Cardiovascular: RRR Lungs: unlabored Abdomen: Soft, nontender, nondistended Neurological: Left sided weakness, antigravity, seems to be gradually improving Skin: Warm and dry. No rashes or lesions. Extremities: No clubbing or cyanosis. No edema.    Data Reviewed: I have personally reviewed following labs and imaging studies  CBC: Recent Labs  Lab 10/03/20 0821 10/04/20 0058 10/05/20 0207  WBC 9.6 8.4 7.7  NEUTROABS 8.0* 6.2 5.0  HGB 11.8* 11.3* 11.5*  HCT 36.0 34.4* 35.1*  MCV 94.7 94.0 93.1  PLT 420* 393 418*    Basic Metabolic Panel: Recent Labs  Lab 09/29/20 0912 10/03/20 0821 10/04/20 0058 10/05/20 0207  NA 135 133* 135 134*  K 4.1 4.0 4.3 4.1  CL 104 104 104 106  CO2 22 21* 23 21*  GLUCOSE 145* 140* 146* 138*  BUN 24* 18 18 17   CREATININE 1.10* 1.10* 1.13* 1.06*  CALCIUM 9.6 9.4 9.3 9.6  MG  --   --  2.0 2.1  PHOS  --   --  3.2 3.6    GFR: Estimated Creatinine Clearance: 54.9 mL/min (Fox Salminen) (by C-G formula based on SCr of 1.06 mg/dL (H)).  Liver Function Tests: Recent Labs  Lab 10/03/20 0821 10/04/20 0058 10/05/20 0207  AST 16 14* 16  ALT 15 14 17   ALKPHOS 55 55 54  BILITOT 0.6 0.6 0.5  PROT 7.0 6.5 6.7  ALBUMIN 2.7* 2.6* 2.7*    CBG: Recent Labs  Lab 10/04/20 1127 10/04/20 1619 10/04/20 2104 10/05/20 0829 10/05/20 1603  GLUCAP 155* 158* 157* 196* 182*     Recent Results (from the past 240 hour(s))  Resp Panel by RT-PCR (Flu Ronson Hagins&B, Covid) Nasopharyngeal Swab     Status: None   Collection Time: 09/27/20  6:54 PM   Specimen: Nasopharyngeal Swab; Nasopharyngeal(NP) swabs in vial transport medium  Result Value Ref Range Status   SARS Coronavirus 2 by RT PCR NEGATIVE NEGATIVE Final    Comment: (NOTE) SARS-CoV-2 target nucleic acids are NOT DETECTED.  The SARS-CoV-2 RNA is generally detectable in upper respiratory specimens during the acute phase of infection. The  lowest concentration of SARS-CoV-2 viral copies this assay can detect is 138 copies/mL. Jeron Grahn negative result does not preclude SARS-Cov-2 infection and should not be used as the sole basis for treatment or other patient management decisions. Areanna Gengler negative result may occur with  improper specimen collection/handling, submission of specimen  other than nasopharyngeal swab, presence of viral mutation(s) within the areas targeted by this assay, and inadequate number of viral copies(<138 copies/mL). Hortencia Martire negative result must be combined with clinical observations, patient history, and epidemiological information. The expected result is Negative.  Fact Sheet for Patients:  EntrepreneurPulse.com.au  Fact Sheet for Healthcare Providers:  IncredibleEmployment.be  This test is no t yet approved or cleared by the [place] FDA and  has been authorized for detection and/or diagnosis of SARS-CoV-2 by FDA under an Emergency Use Authorization (EUA). This EUA will remain  in effect (meaning this test can be used) for the duration of the COVID-19 declaration under Section 564(b)(1) of the Act, 21 U.S.C.section 360bbb-3(b)(1), unless the authorization is terminated  or revoked sooner.       Influenza Joelys Staubs by PCR NEGATIVE NEGATIVE Final   Influenza B by PCR NEGATIVE NEGATIVE Final    Comment: (NOTE) The Xpert Xpress SARS-CoV-2/FLU/RSV plus assay is intended as an aid in the diagnosis of influenza from Nasopharyngeal swab specimens and should not be used as Robie Oats sole basis for treatment. Nasal washings and aspirates are unacceptable for Xpert Xpress SARS-CoV-2/FLU/RSV testing.  Fact Sheet for Patients: EntrepreneurPulse.com.au  Fact Sheet for Healthcare Providers: IncredibleEmployment.be  This test is not yet approved or cleared by the [place] FDA and has been authorized for detection and/or diagnosis of SARS-CoV-2 by FDA under  an Emergency Use Authorization (EUA). This EUA will remain in effect (meaning this test can be used) for the duration of the COVID-19 declaration under Section 564(b)(1) of the Act, 21 U.S.C. section 360bbb-3(b)(1), unless the authorization is terminated or revoked.  Performed at [facility], [place] [address] [place],  [address]          Radiology Studies: No results found.      Scheduled Meds:  aspirin EC  81 mg Oral Daily   docusate sodium  100 mg Oral Daily   enoxaparin (LOVENOX) injection  40 mg Subcutaneous Q24H   famotidine  20 mg Oral Daily   insulin aspart  0-5 Units Subcutaneous QHS   insulin aspart  0-9 Units Subcutaneous TID WC   metoprolol succinate  25 mg Oral Daily   rosuvastatin  40 mg Oral Daily   ticagrelor  90 mg Oral BID   Continuous Infusions:   LOS: 8 days    Time spent: over 30 min    Fayrene Helper, MD [facility]   To contact the attending provider between 7A-7P or the covering provider during after hours 7P-7A, please log into the web site [email or web address] and access using universal [place] password for that web site. If you do not have the password, please call the hospital operator.  10/05/2020, 5:15 PM

## 2020-10-05 NOTE — Progress Notes (Signed)
Occupational Therapy Treatment Patient Details Name: Tracey Meyer MRN: [id number] DOB: 26-May-1960 Today's Date: 10/05/2020    History of present illness 60 yo female presents to [facility] on 7/7 with L weakness, shortness of breath that started on 7/4. 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 CTH specifically shows focus of peripheral hypoattenuation in posterior R parietooccipital lobe. PMH significant for DM2, HTN, ischemic cardiomyopathy EF 35-40%, tobacco use and trying to quit, HLD, breast cancer with L radical mastectomy, CAD s/p angio and stenting of mLAD d/c 7/3.   OT comments  Patient supine in bed and agreeable to OT session.  Focused on L UE exercises, noted flexor synergy emerging with increased tone in elbow and shoulder.  Plan to monitor for resting hand splint. PROM and gentle stretch to L UE, AAROM at shoulder elbow, and hand.  Demonstrating active movement at elbow, minimally at shoulder and gross finger flexion.  Mod assist for bed mobility and mod-max assist for simulated bathing at EOB.  Will follow acutely. SNF remains appropriate.    Follow Up Recommendations  SNF    Equipment Recommendations   (TBD at SNF)    Recommendations for Other Services      Precautions / Restrictions Precautions Precautions: Fall Precaution Comments: L hemiplegia Restrictions Weight Bearing Restrictions: No       Mobility Bed Mobility Overal bed mobility: Needs Assistance Bed Mobility: Rolling;Sidelying to Sit;Sit to Sidelying Rolling: Min assist Sidelying to sit: Mod assist;HOB elevated     Sit to sidelying: Min assist General bed mobility comments: min assist to roll towards R side and mod assist to ascend trunk into sitting with cueing for technique    Transfers                      Balance Overall balance assessment: Needs assistance Sitting-balance support: No upper extremity  supported;Feet supported Sitting balance-Leahy Scale: Fair Sitting balance - Comments: supervision EOB statically, min guard dynamically                                   ADL either performed or assessed with clinical judgement   ADL Overall ADL's : Needs assistance/impaired         Upper Body Bathing: Sitting;Minimal assistance Upper Body Bathing Details (indicate cue type and reason): simulated EOB Lower Body Bathing: Moderate assistance;Sitting/lateral leans Lower Body Bathing Details (indicate cue type and reason): simulated EOB                     Functional mobility during ADLs: Moderate assistance (to EOB) General ADL Comments: focused on L UE AROM, stretch     Vision       Perception     Praxis      Cognition Arousal/Alertness: Awake/alert Behavior During Therapy: WFL for tasks assessed/performed Overall Cognitive Status: Impaired/Different from baseline Area of Impairment: Following commands;Attention;Problem solving                   Current Attention Level: Selective   Following Commands: Follows one step commands consistently;Follows one step commands with increased time     Problem Solving: Slow processing;Difficulty sequencing;Requires verbal cues General Comments: pt requires cueing for sequencing, problem solving with slow processing.  able to follow commands.        Exercises Exercises: Other exercises Other Exercises Other Exercises: PROM  and gentle stretch to L UE from shoulder to hand Other Exercises: AAROM of shoulder flexion, elbow flexion/extension, and hand flexion/extension x 10 reps 1 set (pt initating movement and thearpist completing ROM)   Shoulder Instructions       General Comments      Pertinent Vitals/ Pain       Pain Assessment: 0-10  Home Living                                          Prior Functioning/Environment              Frequency  Min 2X/week         Progress Toward Goals  OT Goals(current goals can now be found in the care plan section)  Progress towards OT goals: Progressing toward goals  Acute Rehab OT Goals Patient Stated Goal: go to rehab and go home OT Goal Formulation: With patient  Plan Discharge plan remains appropriate;Frequency remains appropriate    Co-evaluation                 AM-PAC OT "6 Clicks" Daily Activity     Outcome Measure   Help from another person eating meals?: A Little Help from another person taking care of personal grooming?: A Little Help from another person toileting, which includes using toliet, bedpan, or urinal?: A Lot Help from another person bathing (including washing, rinsing, drying)?: A Lot Help from another person to put on and taking off regular upper body clothing?: A Lot Help from another person to put on and taking off regular lower body clothing?: A Lot 6 Click Score: 14    End of Session    OT Visit Diagnosis: Unsteadiness on feet (R26.81);Other abnormalities of gait and mobility (R26.89);Muscle weakness (generalized) (M62.81);Pain;Hemiplegia and hemiparesis;Other symptoms and signs involving cognitive function Hemiplegia - Right/Left: Left Hemiplegia - dominant/non-dominant: Non-Dominant Hemiplegia - caused by: Cerebral infarction   Activity Tolerance Patient tolerated treatment well   Patient Left in bed;with call bell/phone within reach;with bed alarm set   Nurse Communication Mobility status        Time: 1205-1229 OT Time Calculation (min): 24 min  Charges: OT General Charges $OT Visit: 1 Visit OT Treatments $Self Care/Home Management : 8-22 mins $Neuromuscular Re-education: 8-22 mins  Jolaine Artist, [place] Pager [phone] Office [phone]    Tracey Meyer 10/05/2020, 1:19 PM

## 2020-10-05 NOTE — TOC Progression Note (Signed)
Transition of Care [facility]) - Progression Note    Patient Details  Name: Tracey Meyer MRN: [id number] Date of Birth: 12-20-1960  Transition of Care [facility]) CM/SW [place], [place] Phone Number: 10/05/2020, 2:40 PM  Clinical Narrative:     No current SNF bed offers. Patient has barriers to placement. Patient has no insurance. TOC leadership has been informed. TOC will continue to follow and assist with dc planning needs  Expected Discharge Plan: [place] Barriers to Discharge: Continued Medical Work up  Expected Discharge Plan and Services Expected Discharge Plan: [place] In-house Referral: Clinical Social Work     Living arrangements for the past 2 months: Single Family Home                                       Social Determinants of Health (SDOH) Interventions    Readmission Risk Interventions No flowsheet data found.

## 2020-10-06 LAB — CBC WITH DIFFERENTIAL/PLATELET
Abs Immature Granulocytes: 0.1 10*3/uL — ABNORMAL HIGH (ref 0.00–0.07)
Basophils Absolute: 0 10*3/uL (ref 0.0–0.1)
Basophils Relative: 0 %
Eosinophils Absolute: 0.3 10*3/uL (ref 0.0–0.5)
Eosinophils Relative: 4 %
HCT: 32.3 % — ABNORMAL LOW (ref 36.0–46.0)
Hemoglobin: 10.8 g/dL — ABNORMAL LOW (ref 12.0–15.0)
Immature Granulocytes: 1 %
Lymphocytes Relative: 25 %
Lymphs Abs: 2 10*3/uL (ref 0.7–4.0)
MCH: 31 pg (ref 26.0–34.0)
MCHC: 33.4 g/dL (ref 30.0–36.0)
MCV: 92.8 fL (ref 80.0–100.0)
Monocytes Absolute: 0.6 10*3/uL (ref 0.1–1.0)
Monocytes Relative: 8 %
Neutro Abs: 4.9 10*3/uL (ref 1.7–7.7)
Neutrophils Relative %: 62 %
Platelets: 390 10*3/uL (ref 150–400)
RBC: 3.48 MIL/uL — ABNORMAL LOW (ref 3.87–5.11)
RDW: 12.7 % (ref 11.5–15.5)
WBC: 7.9 10*3/uL (ref 4.0–10.5)
nRBC: 0 % (ref 0.0–0.2)

## 2020-10-06 LAB — PHOSPHORUS: Phosphorus: 3.6 mg/dL (ref 2.5–4.6)

## 2020-10-06 LAB — MAGNESIUM: Magnesium: 2 mg/dL (ref 1.7–2.4)

## 2020-10-06 LAB — COMPREHENSIVE METABOLIC PANEL
ALT: 19 U/L (ref 0–44)
AST: 18 U/L (ref 15–41)
Albumin: 2.6 g/dL — ABNORMAL LOW (ref 3.5–5.0)
Alkaline Phosphatase: 50 U/L (ref 38–126)
Anion gap: 7 (ref 5–15)
BUN: 16 mg/dL (ref 6–20)
CO2: 21 mmol/L — ABNORMAL LOW (ref 22–32)
Calcium: 9.4 mg/dL (ref 8.9–10.3)
Chloride: 106 mmol/L (ref 98–111)
Creatinine, Ser: 1.09 mg/dL — ABNORMAL HIGH (ref 0.44–1.00)
GFR, Estimated: 59 mL/min — ABNORMAL LOW (ref >60)
Glucose, Bld: 143 mg/dL — ABNORMAL HIGH (ref 70–99)
Potassium: 4.2 mmol/L (ref 3.5–5.1)
Sodium: 134 mmol/L — ABNORMAL LOW (ref 135–145)
Total Bilirubin: 0.2 mg/dL — ABNORMAL LOW (ref 0.3–1.2)
Total Protein: 6.5 g/dL (ref 6.5–8.1)

## 2020-10-06 LAB — GLUCOSE, CAPILLARY
Glucose-Capillary: 185 mg/dL — ABNORMAL HIGH (ref 70–99)
Glucose-Capillary: 108 mg/dL — ABNORMAL HIGH (ref 70–99)
Glucose-Capillary: 162 mg/dL — ABNORMAL HIGH (ref 70–99)
Glucose-Capillary: 126 mg/dL — ABNORMAL HIGH (ref 70–99)

## 2020-10-06 NOTE — Plan of Care (Signed)
  Problem: Activity: Goal: Risk for activity intolerance will decrease Outcome: Progressing   Problem: Nutrition: Goal: Adequate nutrition will be maintained Outcome: Progressing   Problem: Coping: Goal: Level of anxiety will decrease Outcome: Progressing   

## 2020-10-06 NOTE — Progress Notes (Signed)
PROGRESS NOTE    Tracey Meyer  HKV:[id number] DOB: [birth year] DOA: 09/27/2020 PCP: Pcp, No (  Chief Complaint  Patient presents with   Code STEMI   Brief Narrative:  Tracey Meyer is Tracey Meyer 60 y/o F with CAD (recent stent to the LAD on 7/4), ischemic cardiomyopathy with EF of 35-40%, HTN, DM2, HLD and smoking who presented to the ED with shortness of breath and orthopnea. She also stated that on the evening of 7/4, she developed left sided weakness and slurred speech which did not improve.   In ED: CT head> 18 x 16 x 17 mm focus of peripheral hypo attenuation in the posterior right parietooccipital region. This is nonspecific and could be related to subacute infarct, but given the somewhat rounded configuration, MRI of the brain without and with contrast recommended to further evaluate.   Cardiology and neurology consulted. IV Lasix started and MRI/ MRA ordered. Admitted to treat acute CHF and work up neurological symptoms.  Assessment & Plan:   Principal Problem:   Acute ischemic stroke [facility]) Active Problems:   HTN (hypertension)   Hyperlipidemia   STEMI involving left anterior descending coronary artery (HCC)   Tobacco abuse   Cardiomyopathy, ischemic   S/P angioplasty with stent to mLAD 09/22/20    DM (diabetes mellitus), type 2 ([place])   HFrEF (heart failure with reduced ejection fraction) (HCC)   CKD (chronic kidney disease), stage III ([place])  Acute ischemic strokes  - per neurology, likely cardioembolic related to cardiomyopathy with low EF  - symptoms occurred on the night of 7/4 after she got home from the hospital - MRI brain with multiple scattered acute to early subacute ischemic nonhemorrhagic infarcts involving bilateral cerebral and cerebellar hemispheres with largest area of infarction measuring up to 2 cm at R occipital cortex - central thromboembolic etiology suspected.  Underlying moderate chronic microvascular disease with Tracey Meyer few scattered remote lacunar  infarcts involving the L thalamus and pons. - MRA head technically limited due to motion artifact, negative for LVO.  Intracranial atherosclerotic disease with associated moderate stneoses involving the distal L V4 segment, distal basilar artery, and carvernous R ICA.  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 MRA neck limited due to motion artifact, 70% stenosis at origin of L ICA.  Otherwise negative MRA of neck. - LDL is 130-Crestor started after MI-  increased from 10 mg this admission to 40 mg daily - A1c is 6.8 - 2D echo does not reveal Tracey Meyer thrombus   - TEE with no left atrial/L atrial appendage thrombus.  Positive intrapulmonary shunting. - Neurology recommending to continue 81 mg aspirin and Brilinta per cards recs - needs 30 day event monitor to r/o atrial fib - SLP eval done- mild aspiration risk- regular diet with thin liquid recommended and MBS ordered - SLUMs is 17/30 - 7/11> - MBS completed and SLP recommending regular diet and thin liquids,   - CIR has signed off today as patient has no one to assist her at home- her husband states he is disabled and cannot help. likely difficult to place patient, will follow TOC, appreciate assistance.  Left ICA stenosis - MRA>> Approximate 70% atheromatous stenosis at the origin of the left ICA - Spoke with vascular surgery. Dr Carlis Abbott to get an appt so she does not fall through the cracks - CTA today- no vascular stenosis- Neuro communicated with vascular   Chronic systolic heart failure (mild) CAD with NSTEMI and stent in  mid LAD on 09/22/2020 -Echo 09/22/2020> EF 35 to 40%, mild LVH, grade 1 diastolic dysfunction, severe hypokinesis of the LV entire anterior wall anteroseptal wall apical segment and inferior apical segment. -Echo 09/28/2020> EF 30 to 29%, grade 2 diastolic dysfunction -Given 20 mg of IV Lasix on 7/7 and no diuretics since - Continue aspirin, Brilinta, Crestor - metoprolol, at this time, can likely gradually  bring BP into normal range -Cardiology is following   Hypertension - continue metop - gradually normalize BP - long term goal 130-150 given posterior circulation vascular stenosis per neuro  Urinary retention - 7/8 Patient had the urge to urinate but was unable to-bladder scan revealed 700 cc- In-N-Out cath done and 600 cc of urine obtained  - no further I and O caths needed   Small amounts of pink discharge in bed pan  - follow - w/u further as indicated - UA from 7/8 with no RBC's   CKD 3B - Cr has mostly been ~ 1.0-1.2  in hospital - Cr was 0.80 on 7/2     DM (diabetes mellitus), type 2 ([place]) -On metformin at home -A1c 6.8 -SSI  Tobacco abuse - Smoking cessation counseling given  DVT prophylaxis: lovenox Code Status: full  Family Communication: none at bedside Disposition:   Status is: Inpatient  Remains inpatient appropriate because:Inpatient level of care appropriate due to severity of illness  Dispo: The patient is from: Home              Anticipated d/c is to: Home              Patient currently is not medically stable to d/c.   Difficult to place patient No       Consultants:  Cardiology neurology  Procedures: Echo (TEE) IMPRESSIONS     1. Akinesis of the anteroseptal wall and apex with overall moderate to  severe LV dysfunction; no obvious apical thrombus noted using definity.   2. Left ventricular ejection fraction, by estimation, is 30 to 35%. The  left ventricle has moderate to severely decreased function. The left  ventricle demonstrates regional wall motion abnormalities (see scoring  diagram/findings for description). The  left ventricular internal cavity size was mildly dilated.   3. Right ventricular systolic function is normal. The right ventricular  size is normal.   4. Left atrial size was mildly dilated. No left atrial/left atrial  appendage thrombus was detected.   5. Tracey Meyer small pericardial effusion is present.   6. The mitral valve  is normal in structure. Mild mitral valve  regurgitation.   7. The aortic valve is tricuspid. Aortic valve regurgitation is not  visualized.   8. There is mild (Grade II) plaque involving the descending aorta.   9. Agitated saline contrast bubble study was positive with shunting  observed after >6 cardiac cycles suggestive of intrapulmonary shunting.   Echo IMPRESSIONS     1. Left ventricular ejection fraction, by estimation, is 30 to 35%. The  left ventricle has moderately decreased function. The left ventricle  demonstrates regional wall motion abnormalities (see scoring  diagram/findings for description). There is mild  left ventricular hypertrophy. Left ventricular diastolic parameters are  consistent with Grade II diastolic dysfunction (pseudonormalization).  Elevated left atrial pressure.   2. No LV thrombus seen   3. Right ventricular systolic function is normal. The right ventricular  size is normal. Tricuspid regurgitation signal is inadequate for assessing  PA pressure.   4. The mitral valve is normal in structure.  Trivial mitral valve  regurgitation.   5. The aortic valve was not well visualized. Aortic valve regurgitation  is not visualized. No aortic stenosis is present.   6. The inferior vena cava is normal in size with greater than 50%  respiratory variability, suggesting right atrial pressure of 3 mmHg.    Antimicrobials:  Anti-infectives (From admission, onward)    None        Subjective: No new complaints  Objective: Vitals:   10/05/20 2155 10/06/20 0502 10/06/20 0739 10/06/20 1437  BP: 128/87 128/84 (!) 128/100 133/74  Pulse: 78 70 63 69  Resp: 15 16 15 13   Temp: 98 F (36.7 C) 98.3 F (36.8 C) 97.6 F (36.4 C) 98.6 F (37 C)  TempSrc: Oral Oral Oral Oral  SpO2: 99% 99% 97% 99%  Weight:      Height:        Intake/Output Summary (Last 24 hours) at 10/06/2020 1753 Last data filed at 10/05/2020 1821 Gross per 24 hour  Intake 150 ml  Output  --  Net 150 ml   Filed Weights   09/30/20 0438 10/01/20 0500 10/02/20 0326  Weight: 69.5 kg 69 kg 70.1 kg    Examination:  General: No acute distress. Cardiovascular: RRR Lungs: unlabored Abdomen: Soft, nontender, nondistended Neurological:  L sided weakness, antigarvity Skin: Warm and dry. No rashes or lesions. Extremities: No clubbing or cyanosis. No edema.     Data Reviewed: I have personally reviewed following labs and imaging studies  CBC: Recent Labs  Lab 10/03/20 0821 10/04/20 0058 10/05/20 0207 10/06/20 0219  WBC 9.6 8.4 7.7 7.9  NEUTROABS 8.0* 6.2 5.0 4.9  HGB 11.8* 11.3* 11.5* 10.8*  HCT 36.0 34.4* 35.1* 32.3*  MCV 94.7 94.0 93.1 92.8  PLT 420* 393 418* 409    Basic Metabolic Panel: Recent Labs  Lab 10/03/20 0821 10/04/20 0058 10/05/20 0207 10/06/20 0219  NA 133* 135 134* 134*  K 4.0 4.3 4.1 4.2  CL 104 104 106 106  CO2 21* 23 21* 21*  GLUCOSE 140* 146* 138* 143*  BUN 18 18 17 16   CREATININE 1.10* 1.13* 1.06* 1.09*  CALCIUM 9.4 9.3 9.6 9.4  MG  --  2.0 2.1 2.0  PHOS  --  3.2 3.6 3.6    GFR: Estimated Creatinine Clearance: 53.4 mL/min (Rachele Lamaster) (by C-G formula based on SCr of 1.09 mg/dL (H)).  Liver Function Tests: Recent Labs  Lab 10/03/20 0821 10/04/20 0058 10/05/20 0207 10/06/20 0219  AST 16 14* 16 18  ALT 15 14 17 19   ALKPHOS 55 55 54 50  BILITOT 0.6 0.6 0.5 0.2*  PROT 7.0 6.5 6.7 6.5  ALBUMIN 2.7* 2.6* 2.7* 2.6*    CBG: Recent Labs  Lab 10/05/20 1603 10/05/20 2154 10/06/20 0737 10/06/20 1211 10/06/20 1627  GLUCAP 182* 174* 185* 108* 162*     Recent Results (from the past 240 hour(s))  Resp Panel by RT-PCR (Flu Terez Freimark&B, Covid) Nasopharyngeal Swab     Status: None   Collection Time: 09/27/20  6:54 PM   Specimen: Nasopharyngeal Swab; Nasopharyngeal(NP) swabs in vial transport medium  Result Value Ref Range Status   SARS Coronavirus 2 by RT PCR NEGATIVE NEGATIVE Final    Comment: (NOTE) SARS-CoV-2 target nucleic acids are  NOT DETECTED.  The SARS-CoV-2 RNA is generally detectable in upper respiratory specimens during the acute phase of infection. The lowest concentration of SARS-CoV-2 viral copies this assay can detect is 138 copies/mL. Emry Tobin negative result does not preclude SARS-Cov-2 infection  and should not be used as the sole basis for treatment or other patient management decisions. Youssef Footman negative result may occur with  improper specimen collection/handling, submission of specimen other than nasopharyngeal swab, presence of viral mutation(s) within the areas targeted by this assay, and inadequate number of viral copies(<138 copies/mL). Denim Start negative result must be combined with clinical observations, patient history, and epidemiological information. The expected result is Negative.  Fact Sheet for Patients:  EntrepreneurPulse.com.au  Fact Sheet for Healthcare Providers:  IncredibleEmployment.be  This test is no t yet approved or cleared by the [place] FDA and  has been authorized for detection and/or diagnosis of SARS-CoV-2 by FDA under an Emergency Use Authorization (EUA). This EUA will remain  in effect (meaning this test can be used) for the duration of the COVID-19 declaration under Section 564(b)(1) of the Act, 21 U.S.C.section 360bbb-3(b)(1), unless the authorization is terminated  or revoked sooner.       Influenza Bartt Gonzaga by PCR NEGATIVE NEGATIVE Final   Influenza B by PCR NEGATIVE NEGATIVE Final    Comment: (NOTE) The Xpert Xpress SARS-CoV-2/FLU/RSV plus assay is intended as an aid in the diagnosis of influenza from Nasopharyngeal swab specimens and should not be used as Quante[place] Schnyder sole basis for treatment. Nasal washings and aspirates are unacceptable for Xpert Xpress SARS-CoV-2/FLU/RSV testing.  Fact Sheet for Patients: EntrepreneurPulse.com.au  Fact Sheet for Healthcare Providers: IncredibleEmployment.be  This test is not  yet approved or cleared by the [place] FDA and has been authorized for detection and/or diagnosis of SARS-CoV-2 by FDA under an Emergency Use Authorization (EUA). This EUA will remain in effect (meaning this test can be used) for the duration of the COVID-19 declaration under Section 564(b)(1) of the Act, 21 U.S.C. section 360bbb-3(b)(1), unless the authorization is terminated or revoked.  Performed at [facility], [place] [address] [place], [place] [address]          Radiology Studies: No results found.      Scheduled Meds:  aspirin EC  81 mg Oral Daily   docusate sodium  100 mg Oral Daily   enoxaparin (LOVENOX) injection  40 mg Subcutaneous Q24H   famotidine  20 mg Oral Daily   insulin aspart  0-5 Units Subcutaneous QHS   insulin aspart  0-9 Units Subcutaneous TID WC   metoprolol succinate  25 mg Oral Daily   rosuvastatin  40 mg Oral Daily   ticagrelor  90 mg Oral BID   Continuous Infusions:   LOS: 9 days    Time spent: over 30 min    Fayrene Helper, MD [facility]   To contact the attending provider between 7A-7P or the covering provider during after hours 7P-7A, please log into the web site [email or web address] and access using universal [place] password for that web site. If you do not have the password, please call the hospital operator.  10/06/2020, 5:53 PM

## 2020-10-07 LAB — CBC WITH DIFFERENTIAL/PLATELET
Abs Immature Granulocytes: 0.05 10*3/uL (ref 0.00–0.07)
Basophils Absolute: 0 10*3/uL (ref 0.0–0.1)
Basophils Relative: 0 %
Eosinophils Absolute: 0.4 10*3/uL (ref 0.0–0.5)
Eosinophils Relative: 5 %
HCT: 33.2 % — ABNORMAL LOW (ref 36.0–46.0)
Hemoglobin: 10.9 g/dL — ABNORMAL LOW (ref 12.0–15.0)
Immature Granulocytes: 1 %
Lymphocytes Relative: 31 %
Lymphs Abs: 2.4 10*3/uL (ref 0.7–4.0)
MCH: 30.9 pg (ref 26.0–34.0)
MCHC: 32.8 g/dL (ref 30.0–36.0)
MCV: 94.1 fL (ref 80.0–100.0)
Monocytes Absolute: 0.6 10*3/uL (ref 0.1–1.0)
Monocytes Relative: 8 %
Neutro Abs: 4.3 10*3/uL (ref 1.7–7.7)
Neutrophils Relative %: 55 %
Platelets: 416 10*3/uL — ABNORMAL HIGH (ref 150–400)
RBC: 3.53 MIL/uL — ABNORMAL LOW (ref 3.87–5.11)
RDW: 12.7 % (ref 11.5–15.5)
WBC: 7.7 10*3/uL (ref 4.0–10.5)
nRBC: 0 % (ref 0.0–0.2)

## 2020-10-07 LAB — COMPREHENSIVE METABOLIC PANEL
ALT: 18 U/L (ref 0–44)
AST: 18 U/L (ref 15–41)
Albumin: 2.6 g/dL — ABNORMAL LOW (ref 3.5–5.0)
Alkaline Phosphatase: 47 U/L (ref 38–126)
Anion gap: 5 (ref 5–15)
BUN: 16 mg/dL (ref 6–20)
CO2: 24 mmol/L (ref 22–32)
Calcium: 9.7 mg/dL (ref 8.9–10.3)
Chloride: 106 mmol/L (ref 98–111)
Creatinine, Ser: 1.2 mg/dL — ABNORMAL HIGH (ref 0.44–1.00)
GFR, Estimated: 52 mL/min — ABNORMAL LOW (ref >60)
Glucose, Bld: 142 mg/dL — ABNORMAL HIGH (ref 70–99)
Potassium: 4.1 mmol/L (ref 3.5–5.1)
Sodium: 135 mmol/L (ref 135–145)
Total Bilirubin: 0.5 mg/dL (ref 0.3–1.2)
Total Protein: 6.4 g/dL — ABNORMAL LOW (ref 6.5–8.1)

## 2020-10-07 LAB — GLUCOSE, CAPILLARY
Glucose-Capillary: 161 mg/dL — ABNORMAL HIGH (ref 70–99)
Glucose-Capillary: 129 mg/dL — ABNORMAL HIGH (ref 70–99)
Glucose-Capillary: 117 mg/dL — ABNORMAL HIGH (ref 70–99)
Glucose-Capillary: 149 mg/dL — ABNORMAL HIGH (ref 70–99)

## 2020-10-07 LAB — PHOSPHORUS: Phosphorus: 4.4 mg/dL (ref 2.5–4.6)

## 2020-10-07 LAB — MAGNESIUM: Magnesium: 2 mg/dL (ref 1.7–2.4)

## 2020-10-07 NOTE — Progress Notes (Signed)
PROGRESS NOTE    Tracey Meyer  ALP:[id number] DOB: 1960/05/28 DOA: 09/27/2020 PCP: Pcp, No (  Chief Complaint  Patient presents with   Code STEMI   Brief Narrative:  Tracey Meyer is Tracey Meyer 60 y/o F with CAD (recent stent to the LAD on 7/4), ischemic cardiomyopathy with EF of 35-40%, HTN, DM2, HLD and smoking who presented to the ED with shortness of breath and orthopnea. She also stated that on the evening of 7/4, she developed left sided weakness and slurred speech which did not improve.   In ED: CT head> 18 x 16 x 17 mm focus of peripheral hypo attenuation in the posterior right parietooccipital region. This is nonspecific and could be related to subacute infarct, but given the somewhat rounded configuration, MRI of the brain without and with contrast recommended to further evaluate.   Cardiology and neurology consulted. IV Lasix started and MRI/ MRA ordered. Admitted to treat acute CHF and work up neurological symptoms.  Assessment & Plan:   Principal Problem:   Acute ischemic stroke [facility]) Active Problems:   HTN (hypertension)   Hyperlipidemia   STEMI involving left anterior descending coronary artery (HCC)   Tobacco abuse   Cardiomyopathy, ischemic   S/P angioplasty with stent to mLAD 09/22/20    DM (diabetes mellitus), type 2 ([place])   HFrEF (heart failure with reduced ejection fraction) (HCC)   CKD (chronic kidney disease), stage III ([place])  Acute ischemic strokes  - per neurology, likely cardioembolic related to cardiomyopathy with low EF  - symptoms occurred on the night of 7/4 after she got home from the hospital - MRI brain with multiple scattered acute to early subacute ischemic nonhemorrhagic infarcts involving bilateral cerebral and cerebellar hemispheres with largest area of infarction measuring up to 2 cm at R occipital cortex - central thromboembolic etiology suspected.  Underlying moderate chronic microvascular disease with Tracey Meyer few scattered remote lacunar  infarcts involving the L thalamus and pons. - MRA head technically limited due to motion artifact, negative for LVO.  Intracranial atherosclerotic disease with associated moderate stneoses involving the distal L V4 segment, distal basilar artery, and carvernous R ICA.  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 MRA neck limited due to motion artifact, 70% stenosis at origin of L ICA.  Otherwise negative MRA of neck. - LDL is 130-Crestor started after MI-  increased from 10 mg this admission to 40 mg daily - A1c is 6.8 - 2D echo does not reveal Tracey Meyer thrombus   - TEE with no left atrial/L atrial appendage thrombus.  Positive intrapulmonary shunting. - Neurology recommending to continue 81 mg aspirin and Brilinta per cards recs - needs 30 day event monitor to r/o atrial fib - SLP eval done- mild aspiration risk- regular diet with thin liquid recommended and MBS ordered - SLUMs is 17/30 - 7/11> - MBS completed and SLP recommending regular diet and thin liquids,   - CIR has signed off today as patient has no one to assist her at home- her husband states he is disabled and cannot help. likely difficult to place patient, will follow TOC, appreciate assistance.  Left ICA stenosis - MRA>> Approximate 70% atheromatous stenosis at the origin of the left ICA - Spoke with vascular surgery. Dr Carlis Abbott to get an appt so she does not fall through the cracks - CTA today- no vascular stenosis- Neuro communicated with vascular   Chronic systolic heart failure (mild) CAD with NSTEMI and stent in  mid LAD on 09/22/2020 -Echo 09/22/2020> EF 35 to 40%, mild LVH, grade 1 diastolic dysfunction, severe hypokinesis of the LV entire anterior wall anteroseptal wall apical segment and inferior apical segment. -Echo 09/28/2020> EF 30 to 16%, grade 2 diastolic dysfunction -Given 20 mg of IV Lasix on 7/7 and no diuretics since - Continue aspirin, Brilinta, Crestor - metoprolol, at this time, can likely gradually  bring BP into normal range -Cardiology is following   Hypertension - continue metop - gradually normalize BP - long term goal 130-150 given posterior circulation vascular stenosis per neuro  Urinary retention - 7/8 Patient had the urge to urinate but was unable to-bladder scan revealed 700 cc- In-N-Out cath done and 600 cc of urine obtained  - no further I and O caths needed   Small amounts of pink discharge in bed pan  - follow - w/u further as indicated - UA from 7/8 with no RBC's   CKD 3B - Cr has mostly been ~ 1.0-1.2  in hospital - Cr was 0.80 on 7/2     DM (diabetes mellitus), type 2 ([place]) -On metformin at home -A1c 6.8 -SSI  Tobacco abuse - Smoking cessation counseling given  DVT prophylaxis: lovenox Code Status: full  Family Communication: none at bedside Disposition:   Status is: Inpatient  Remains inpatient appropriate because:Inpatient level of care appropriate due to severity of illness  Dispo: The patient is from: Home              Anticipated d/c is to: Home              Patient currently is not medically stable to d/c.   Difficult to place patient No       Consultants:  Cardiology neurology  Procedures: Echo (TEE) IMPRESSIONS     1. Akinesis of the anteroseptal wall and apex with overall moderate to  severe LV dysfunction; no obvious apical thrombus noted using definity.   2. Left ventricular ejection fraction, by estimation, is 30 to 35%. The  left ventricle has moderate to severely decreased function. The left  ventricle demonstrates regional wall motion abnormalities (see scoring  diagram/findings for description). The  left ventricular internal cavity size was mildly dilated.   3. Right ventricular systolic function is normal. The right ventricular  size is normal.   4. Left atrial size was mildly dilated. No left atrial/left atrial  appendage thrombus was detected.   5. Tracey Meyer small pericardial effusion is present.   6. The mitral valve  is normal in structure. Mild mitral valve  regurgitation.   7. The aortic valve is tricuspid. Aortic valve regurgitation is not  visualized.   8. There is mild (Grade II) plaque involving the descending aorta.   9. Agitated saline contrast bubble study was positive with shunting  observed after >6 cardiac cycles suggestive of intrapulmonary shunting.   Echo IMPRESSIONS     1. Left ventricular ejection fraction, by estimation, is 30 to 35%. The  left ventricle has moderately decreased function. The left ventricle  demonstrates regional wall motion abnormalities (see scoring  diagram/findings for description). There is mild  left ventricular hypertrophy. Left ventricular diastolic parameters are  consistent with Grade II diastolic dysfunction (pseudonormalization).  Elevated left atrial pressure.   2. No LV thrombus seen   3. Right ventricular systolic function is normal. The right ventricular  size is normal. Tricuspid regurgitation signal is inadequate for assessing  PA pressure.   4. The mitral valve is normal in structure.  Trivial mitral valve  regurgitation.   5. The aortic valve was not well visualized. Aortic valve regurgitation  is not visualized. No aortic stenosis is present.   6. The inferior vena cava is normal in size with greater than 50%  respiratory variability, suggesting right atrial pressure of 3 mmHg.    Antimicrobials:  Anti-infectives (From admission, onward)    None        Subjective: No new complaints  Objective: Vitals:   10/06/20 2002 10/06/20 2205 10/07/20 0406 10/07/20 1343  BP: (!) 147/83  135/87 128/89  Pulse: 73 60 68 63  Resp: 20 17 16 16   Temp:   97.6 F (36.4 C) 97.6 F (36.4 C)  TempSrc:   Oral Oral  SpO2: 97% 99% 100% 100%  Weight:      Height:        Intake/Output Summary (Last 24 hours) at 10/07/2020 1625 Last data filed at 10/07/2020 1300 Gross per 24 hour  Intake 220 ml  Output 100 ml  Net 120 ml   Filed Weights    09/30/20 0438 10/01/20 0500 10/02/20 0326  Weight: 69.5 kg 69 kg 70.1 kg    Examination:  General: No acute distress. Cardiovascular: RRR Lungs: unlabored Abdomen: Soft, nontender, nondistended Neurological: L sided weakness, antigravity Skin: Warm and dry. No rashes or lesions. Extremities: No clubbing or cyanosis. No edema.   Data Reviewed: I have personally reviewed following labs and imaging studies  CBC: Recent Labs  Lab 10/03/20 0821 10/04/20 0058 10/05/20 0207 10/06/20 0219 10/07/20 0119  WBC 9.6 8.4 7.7 7.9 7.7  NEUTROABS 8.0* 6.2 5.0 4.9 4.3  HGB 11.8* 11.3* 11.5* 10.8* 10.9*  HCT 36.0 34.4* 35.1* 32.3* 33.2*  MCV 94.7 94.0 93.1 92.8 94.1  PLT 420* 393 418* 390 416*    Basic Metabolic Panel: Recent Labs  Lab 10/03/20 0821 10/04/20 0058 10/05/20 0207 10/06/20 0219 10/07/20 0119  NA 133* 135 134* 134* 135  K 4.0 4.3 4.1 4.2 4.1  CL 104 104 106 106 106  CO2 21* 23 21* 21* 24  GLUCOSE 140* 146* 138* 143* 142*  BUN 18 18 17 16 16   CREATININE 1.10* 1.13* 1.06* 1.09* 1.20*  CALCIUM 9.4 9.3 9.6 9.4 9.7  MG  --  2.0 2.1 2.0 2.0  PHOS  --  3.2 3.6 3.6 4.4    GFR: Estimated Creatinine Clearance: 48.5 mL/min (Tracey Meyer) (by C-G formula based on SCr of 1.2 mg/dL (H)).  Liver Function Tests: Recent Labs  Lab 10/03/20 0821 10/04/20 0058 10/05/20 0207 10/06/20 0219 10/07/20 0119  AST 16 14* 16 18 18   ALT 15 14 17 19 18   ALKPHOS 55 55 54 50 47  BILITOT 0.6 0.6 0.5 0.2* 0.5  PROT 7.0 6.5 6.7 6.5 6.4*  ALBUMIN 2.7* 2.6* 2.7* 2.6* 2.6*    CBG: Recent Labs  Lab 10/06/20 1627 10/06/20 2128 10/07/20 0800 10/07/20 1210 10/07/20 1528  GLUCAP 162* 126* 161* 129* 117*     Recent Results (from the past 240 hour(s))  Resp Panel by RT-PCR (Flu Tracey Meyer&B, Covid) Nasopharyngeal Swab     Status: None   Collection Time: 09/27/20  6:54 PM   Specimen: Nasopharyngeal Swab; Nasopharyngeal(NP) swabs in vial transport medium  Result Value Ref Range Status   SARS Coronavirus  2 by RT PCR NEGATIVE NEGATIVE Final    Comment: (NOTE) SARS-CoV-2 target nucleic acids are NOT DETECTED.  The SARS-CoV-2 RNA is generally detectable in upper respiratory specimens during the acute phase of infection. The lowest  concentration of SARS-CoV-2 viral copies this assay can detect is 138 copies/mL. Tracey Meyer negative result does not preclude SARS-Cov-2 infection and should not be used as the sole basis for treatment or other patient management decisions. Tracey Meyer negative result may occur with  improper specimen collection/handling, submission of specimen other than nasopharyngeal swab, presence of viral mutation(s) within the areas targeted by this assay, and inadequate number of viral copies(<138 copies/mL). Tracey Meyer negative result must be combined with clinical observations, patient history, and epidemiological information. The expected result is Negative.  Fact Sheet for Patients:  EntrepreneurPulse.com.au  Fact Sheet for Healthcare Providers:  IncredibleEmployment.be  This test is no t yet approved or cleared by the [place] FDA and  has been authorized for detection and/or diagnosis of SARS-CoV-2 by FDA under an Emergency Use Authorization (EUA). This EUA will remain  in effect (meaning this test can be used) for the duration of the COVID-19 declaration under Section 564(b)(1) of the Act, 21 U.S.C.section 360bbb-3(b)(1), unless the authorization is terminated  or revoked sooner.       Influenza Tracey Meyer by PCR NEGATIVE NEGATIVE Final   Influenza B by PCR NEGATIVE NEGATIVE Final    Comment: (NOTE) The Xpert Xpress SARS-CoV-2/FLU/RSV plus assay is intended as an aid in the diagnosis of influenza from Nasopharyngeal swab specimens and should not be used as Tracey Meyer sole basis for treatment. Nasal washings and aspirates are unacceptable for Xpert Xpress SARS-CoV-2/FLU/RSV testing.  Fact Sheet for Patients: EntrepreneurPulse.com.au  Fact  Sheet for Healthcare Providers: IncredibleEmployment.be  This test is not yet approved or cleared by the [place] FDA and has been authorized for detection and/or diagnosis of SARS-CoV-2 by FDA under an Emergency Use Authorization (EUA). This EUA will remain in effect (meaning this test can be used) for the duration of the COVID-19 declaration under Section 564(b)(1) of the Act, 21 U.S.C. section 360bbb-3(b)(1), unless the authorization is terminated or revoked.  Performed at [facility], [place] [address] [place], [place] [address]          Radiology Studies: No results found.      Scheduled Meds:  aspirin EC  81 mg Oral Daily   docusate sodium  100 mg Oral Daily   enoxaparin (LOVENOX) injection  40 mg Subcutaneous Q24H   famotidine  20 mg Oral Daily   insulin aspart  0-5 Units Subcutaneous QHS   insulin aspart  0-9 Units Subcutaneous TID WC   metoprolol succinate  25 mg Oral Daily   rosuvastatin  40 mg Oral Daily   ticagrelor  90 mg Oral BID   Continuous Infusions:   LOS: 10 days    Time spent: over 30 min    Tracey Helper, MD [facility]   To contact the attending provider between 7A-7P or the covering provider during after hours 7P-7A, please log into the web site [email or web address] and access using universal [place] password for that web site. If you do not have the password, please call the hospital operator.  10/07/2020, 4:25 PM

## 2020-10-07 NOTE — Plan of Care (Signed)
  Problem: Education: Goal: Knowledge of General Education information will improve Description: Including pain rating scale, medication(s)/side effects and non-pharmacologic comfort measures Outcome: Progressing   Problem: Health Behavior/Discharge Planning: Goal: Ability to manage health-related needs will improve Outcome: Progressing   Problem: Clinical Measurements: Goal: Ability to maintain clinical measurements within normal limits will improve Outcome: Progressing Goal: Will remain free from infection Outcome: Progressing Goal: Diagnostic test results will improve Outcome: Progressing Goal: Respiratory complications will improve Outcome: Progressing Goal: Cardiovascular complication will be avoided Outcome: Progressing   Problem: Activity: Goal: Risk for activity intolerance will decrease Outcome: Progressing   Problem: Nutrition: Goal: Adequate nutrition will be maintained Outcome: Progressing   Problem: Coping: Goal: Level of anxiety will decrease Outcome: Progressing   Problem: Elimination: Goal: Will not experience complications related to bowel motility Outcome: Progressing Goal: Will not experience complications related to urinary retention Outcome: Progressing   Problem: Pain Managment: Goal: General experience of comfort will improve Outcome: Progressing   Problem: Safety: Goal: Ability to remain free from injury will improve Outcome: Progressing   Problem: Skin Integrity: Goal: Risk for impaired skin integrity will decrease Outcome: Progressing   Problem: Education: Goal: Knowledge of disease or condition will improve Outcome: Progressing Goal: Knowledge of secondary prevention will improve Outcome: Progressing Goal: Knowledge of patient specific risk factors addressed and post discharge goals established will improve Outcome: Progressing Goal: Individualized Educational Video(s) Outcome: Progressing   Problem: Coping: Goal: Will verbalize  positive feelings about self Outcome: Progressing Goal: Will identify appropriate support needs Outcome: Progressing   Problem: Health Behavior/Discharge Planning: Goal: Ability to manage health-related needs will improve Outcome: Progressing   Problem: Self-Care: Goal: Ability to participate in self-care as condition permits will improve Outcome: Progressing Goal: Ability to communicate needs accurately will improve Outcome: Progressing

## 2020-10-08 LAB — CBC WITH DIFFERENTIAL/PLATELET
Abs Immature Granulocytes: 0.04 10*3/uL (ref 0.00–0.07)
Basophils Absolute: 0 10*3/uL (ref 0.0–0.1)
Basophils Relative: 1 %
Eosinophils Absolute: 0.4 10*3/uL (ref 0.0–0.5)
Eosinophils Relative: 6 %
HCT: 33.4 % — ABNORMAL LOW (ref 36.0–46.0)
Hemoglobin: 10.9 g/dL — ABNORMAL LOW (ref 12.0–15.0)
Immature Granulocytes: 1 %
Lymphocytes Relative: 33 %
Lymphs Abs: 2.2 10*3/uL (ref 0.7–4.0)
MCH: 30.9 pg (ref 26.0–34.0)
MCHC: 32.6 g/dL (ref 30.0–36.0)
MCV: 94.6 fL (ref 80.0–100.0)
Monocytes Absolute: 0.5 10*3/uL (ref 0.1–1.0)
Monocytes Relative: 8 %
Neutro Abs: 3.5 10*3/uL (ref 1.7–7.7)
Neutrophils Relative %: 51 %
Platelets: 390 10*3/uL (ref 150–400)
RBC: 3.53 MIL/uL — ABNORMAL LOW (ref 3.87–5.11)
RDW: 12.7 % (ref 11.5–15.5)
WBC: 6.6 10*3/uL (ref 4.0–10.5)
nRBC: 0 % (ref 0.0–0.2)

## 2020-10-08 LAB — GLUCOSE, CAPILLARY
Glucose-Capillary: 144 mg/dL — ABNORMAL HIGH (ref 70–99)
Glucose-Capillary: 137 mg/dL — ABNORMAL HIGH (ref 70–99)
Glucose-Capillary: 139 mg/dL — ABNORMAL HIGH (ref 70–99)
Glucose-Capillary: 146 mg/dL — ABNORMAL HIGH (ref 70–99)

## 2020-10-08 LAB — COMPREHENSIVE METABOLIC PANEL
ALT: 19 U/L (ref 0–44)
AST: 16 U/L (ref 15–41)
Albumin: 2.6 g/dL — ABNORMAL LOW (ref 3.5–5.0)
Alkaline Phosphatase: 47 U/L (ref 38–126)
Anion gap: 8 (ref 5–15)
BUN: 16 mg/dL (ref 6–20)
CO2: 22 mmol/L (ref 22–32)
Calcium: 9.6 mg/dL (ref 8.9–10.3)
Chloride: 106 mmol/L (ref 98–111)
Creatinine, Ser: 1.04 mg/dL — ABNORMAL HIGH (ref 0.44–1.00)
GFR, Estimated: >60 mL/min (ref >60)
Glucose, Bld: 142 mg/dL — ABNORMAL HIGH (ref 70–99)
Potassium: 4 mmol/L (ref 3.5–5.1)
Sodium: 136 mmol/L (ref 135–145)
Total Bilirubin: 0.4 mg/dL (ref 0.3–1.2)
Total Protein: 6.6 g/dL (ref 6.5–8.1)

## 2020-10-08 LAB — MAGNESIUM: Magnesium: 2 mg/dL (ref 1.7–2.4)

## 2020-10-08 LAB — PHOSPHORUS: Phosphorus: 3.8 mg/dL (ref 2.5–4.6)

## 2020-10-08 NOTE — Progress Notes (Signed)
Inpatient Rehab Admissions Coordinator:   CIR re-consulted. I will request that rehab MD review pt.'s case to see if she demonstrates potential to reach supervision level during CIR stay, as her paprtnr is unable to provide physical assist.  Clemens Catholic, [place], [place] Admissions Coordinator  [phone] (celll) [phone] (office)

## 2020-10-08 NOTE — Progress Notes (Signed)
[facility] will sign off.   Medication Recommendations:  as per [facility] Other recommendations (labs, testing, etc):  will arrange 30 day event monitor after DC Follow up as an outpatient:  with Dr Martinique or APP in 2-3 weeks

## 2020-10-08 NOTE — Progress Notes (Addendum)
PROGRESS NOTE    Tracey Meyer  UUV:[id number] DOB: 08-02-1960 DOA: 09/27/2020 PCP: Pcp, No (  Chief Complaint  Patient presents with   Code STEMI   Brief Narrative:  Tracey Meyer is Tracey Meyer 60 y/o F with CAD (recent stent to the LAD on 7/4), ischemic cardiomyopathy with EF of 35-40%, HTN, DM2, HLD and smoking who presented to the ED with shortness of breath and orthopnea. She also stated that on the evening of 7/4, she developed left sided weakness and slurred speech which did not improve.   In ED: CT head> 18 x 16 x 17 mm focus of peripheral hypo attenuation in the posterior right parietooccipital region. This is nonspecific and could be related to subacute infarct, but given the somewhat rounded configuration, MRI of the brain without and with contrast recommended to further evaluate.   Cardiology and neurology consulted. IV Lasix started and MRI/ MRA ordered. Admitted to treat acute CHF and work up neurological symptoms.  Assessment & Plan:   Principal Problem:   Acute ischemic stroke Tracey Meyer) Active Problems:   HTN (hypertension)   Hyperlipidemia   STEMI involving left anterior descending coronary artery (HCC)   Tobacco abuse   Cardiomyopathy, ischemic   S/P angioplasty with stent to mLAD 09/22/20    DM (diabetes mellitus), type 2 (Tracey Meyer)   HFrEF (heart failure with reduced ejection fraction) (HCC)   CKD (chronic kidney disease), stage III (Tracey Meyer)  Acute ischemic strokes  - per neurology, likely cardioembolic related to cardiomyopathy with low EF  - symptoms occurred on the night of 7/4 after she got home from the hospital - MRI brain with multiple scattered acute to early subacute ischemic nonhemorrhagic infarcts involving bilateral cerebral and cerebellar hemispheres with largest area of infarction measuring up to 2 cm at R occipital cortex - central thromboembolic etiology suspected.  Underlying moderate chronic microvascular disease with Tracey Meyer few scattered remote lacunar  infarcts involving the L thalamus and pons. - MRA head technically limited due to motion artifact, negative for LVO.  Intracranial atherosclerotic disease with associated moderate stneoses involving the distal L V4 segment, distal basilar artery, and carvernous R ICA.  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 MRA neck limited due to motion artifact, 70% stenosis at origin of L ICA.  Otherwise negative MRA of neck. - LDL is 130-Crestor started after MI-  increased from 10 mg this admission to 40 mg daily - A1c is 6.8 - 2D echo does not reveal Tracey Meyer thrombus   - TEE with no left atrial/L atrial appendage thrombus.  Positive intrapulmonary shunting. - Neurology recommending to continue 81 mg aspirin and Brilinta per cards recs - needs 30 day event monitor to r/o atrial fib - SLP eval done- mild aspiration risk- regular diet with thin liquid recommended and MBS ordered - SLUMs is 17/30 - 7/11> - MBS completed and SLP recommending regular diet and thin liquids,   - CIR has signed off as patient has no one to assist her at home- her husband states he is disabled and cannot help. likely difficult to place patient, will follow TOC, appreciate assistance.  Left ICA stenosis - MRA>> Approximate 70% atheromatous stenosis at the origin of the left ICA - Previous provider spoke with vascular surgery. Dr Carlis Abbott to get an appt so she does not fall through the cracks - CTA today- no vascular stenosis- Neuro communicated with vascular   Chronic systolic heart failure (mild) CAD with NSTEMI and stent  in mid LAD on 09/22/2020 -Echo 09/22/2020> EF 35 to 40%, mild LVH, grade 1 diastolic dysfunction, severe hypokinesis of the LV entire anterior wall anteroseptal wall apical segment and inferior apical segment. -Echo 09/28/2020> EF 30 to 88%, grade 2 diastolic dysfunction -Given 20 mg of IV Lasix on 7/7 and no diuretics since - Continue aspirin, Brilinta, Crestor - metoprolol, at this time, can likely  gradually bring BP into normal range -Cardiology is following   Hypertension - continue metop - gradually normalize BP - long term goal 130-150 given posterior circulation vascular stenosis per neuro  Urinary retention - 7/8 Patient had the urge to urinate but was unable to-bladder scan revealed 700 cc- In-N-Out cath done and 600 cc of urine obtained  - no further I and O caths needed   Small amounts of pink discharge in bed pan  - follow - w/u further as indicated - UA from 7/8 with no RBC's   CKD 3B - Cr has mostly been ~ 1.0-1.2  in hospital - Cr was 0.80 on 7/2     DM (diabetes mellitus), type 2 (Tracey Meyer) -On metformin at home -A1c 6.8 -SSI  Tobacco abuse - Smoking cessation counseling given  DVT prophylaxis: lovenox Code Status: full  Family Communication: none at bedside Disposition:   Status is: Inpatient  Remains inpatient appropriate because:Inpatient level of care appropriate due to severity of illness  Dispo: The patient is from: Home              Anticipated d/c is to: Home              Patient currently is not medically stable to d/c.   Difficult to place patient No       Consultants:  Cardiology neurology  Procedures: Echo (TEE) IMPRESSIONS     1. Akinesis of the anteroseptal wall and apex with overall moderate to  severe LV dysfunction; no obvious apical thrombus noted using definity.   2. Left ventricular ejection fraction, by estimation, is 30 to 35%. The  left ventricle has moderate to severely decreased function. The left  ventricle demonstrates regional wall motion abnormalities (see scoring  diagram/findings for description). The  left ventricular internal cavity size was mildly dilated.   3. Right ventricular systolic function is normal. The right ventricular  size is normal.   4. Left atrial size was mildly dilated. No left atrial/left atrial  appendage thrombus was detected.   5. Micheline Markes small pericardial effusion is present.   6. The  mitral valve is normal in structure. Mild mitral valve  regurgitation.   7. The aortic valve is tricuspid. Aortic valve regurgitation is not  visualized.   8. There is mild (Grade II) plaque involving the descending aorta.   9. Agitated saline contrast bubble study was positive with shunting  observed after >6 cardiac cycles suggestive of intrapulmonary shunting.   Echo IMPRESSIONS     1. Left ventricular ejection fraction, by estimation, is 30 to 35%. The  left ventricle has moderately decreased function. The left ventricle  demonstrates regional wall motion abnormalities (see scoring  diagram/findings for description). There is mild  left ventricular hypertrophy. Left ventricular diastolic parameters are  consistent with Grade II diastolic dysfunction (pseudonormalization).  Elevated left atrial pressure.   2. No LV thrombus seen   3. Right ventricular systolic function is normal. The right ventricular  size is normal. Tricuspid regurgitation signal is inadequate for assessing  PA pressure.   4. The mitral valve is normal in  structure. Trivial mitral valve  regurgitation.   5. The aortic valve was not well visualized. Aortic valve regurgitation  is not visualized. No aortic stenosis is present.   6. The inferior vena cava is normal in size with greater than 50%  respiratory variability, suggesting right atrial pressure of 3 mmHg.    Antimicrobials:  Anti-infectives (From admission, onward)    None        Subjective: No new complaints  Objective: Vitals:   10/07/20 1343 10/07/20 2037 10/08/20 0507 10/08/20 0815  BP: 128/89 (!) 144/74 130/80 129/82  Pulse: 63 77 77 68  Resp: 16 19 16    Temp: 97.6 F (36.4 C) 98 F (36.7 C) 98.5 F (36.9 C)   TempSrc: Oral Oral Oral   SpO2: 100% 100% 100%   Weight:      Height:        Intake/Output Summary (Last 24 hours) at 10/08/2020 1201 Last data filed at 10/08/2020 0253 Gross per 24 hour  Intake 100 ml  Output 250 ml   Net -150 ml   Filed Weights   09/30/20 0438 10/01/20 0500 10/02/20 0326  Weight: 69.5 kg 69 kg 70.1 kg    Examination:  General: No acute distress. Cardiovascular: RRR Lungs: unlabored. Abdomen: Soft, nontender, nondistended  Neurological: L sided weakness - antigravity - seen working with therapy, standing with assistance with walker Skin: Warm and dry. No rashes or lesions. Extremities: No clubbing or cyanosis. No edema.    Data Reviewed: I have personally reviewed following labs and imaging studies  CBC: Recent Labs  Lab 10/04/20 0058 10/05/20 0207 10/06/20 0219 10/07/20 0119 10/08/20 0124  WBC 8.4 7.7 7.9 7.7 6.6  NEUTROABS 6.2 5.0 4.9 4.3 3.5  HGB 11.3* 11.5* 10.8* 10.9* 10.9*  HCT 34.4* 35.1* 32.3* 33.2* 33.4*  MCV 94.0 93.1 92.8 94.1 94.6  PLT 393 418* 390 416* 324    Basic Metabolic Panel: Recent Labs  Lab 10/04/20 0058 10/05/20 0207 10/06/20 0219 10/07/20 0119 10/08/20 0124  NA 135 134* 134* 135 136  K 4.3 4.1 4.2 4.1 4.0  CL 104 106 106 106 106  CO2 23 21* 21* 24 22  GLUCOSE 146* 138* 143* 142* 142*  BUN 18 17 16 16 16   CREATININE 1.13* 1.06* 1.09* 1.20* 1.04*  CALCIUM 9.3 9.6 9.4 9.7 9.6  MG 2.0 2.1 2.0 2.0 2.0  PHOS 3.2 3.6 3.6 4.4 3.8    GFR: Estimated Creatinine Clearance: 56 mL/min (Marvelyn Bouchillon) (by C-G formula based on SCr of 1.04 mg/dL (H)).  Liver Function Tests: Recent Labs  Lab 10/04/20 0058 10/05/20 0207 10/06/20 0219 10/07/20 0119 10/08/20 0124  AST 14* 16 18 18 16   ALT 14 17 19 18 19   ALKPHOS 55 54 50 47 47  BILITOT 0.6 0.5 0.2* 0.5 0.4  PROT 6.5 6.7 6.5 6.4* 6.6  ALBUMIN 2.6* 2.7* 2.6* 2.6* 2.6*    CBG: Recent Labs  Lab 10/07/20 1210 10/07/20 1528 10/07/20 2123 10/08/20 0735 10/08/20 1123  GLUCAP 129* 117* 149* 144* 137*     No results found for this or any previous visit (from the past 240 hour(s)).        Radiology Studies: No results found.      Scheduled Meds:  aspirin EC  81 mg Oral Daily    docusate sodium  100 mg Oral Daily   enoxaparin (LOVENOX) injection  40 mg Subcutaneous Q24H   famotidine  20 mg Oral Daily   insulin aspart  0-5 Units Subcutaneous QHS  insulin aspart  0-9 Units Subcutaneous TID WC   metoprolol succinate  25 mg Oral Daily   rosuvastatin  40 mg Oral Daily   ticagrelor  90 mg Oral BID   Continuous Infusions:   LOS: 11 days    Time spent: over 30 min    Fayrene Helper, MD [facility]   To contact the attending provider between 7A-7P or the covering provider during after hours 7P-7A, please log into the web site [email or web address] and access using universal [place] password for that web site. If you do not have the password, please call the hospital operator.  10/08/2020, 12:01 PM

## 2020-10-08 NOTE — Progress Notes (Signed)
Physical Therapy Treatment Patient Details Name: Tracey Meyer MRN: [id number] DOB: June 26, 1960 Today's Date: 10/08/2020    History of Present Illness 60 yo female presents to [facility] on 7/7 with L weakness, shortness of breath that started on 7/4. 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 CTH specifically shows focus of peripheral hypoattenuation in posterior R parietooccipital lobe. PMH significant for DM2, HTN, ischemic cardiomyopathy EF 35-40%, tobacco use and trying to quit, HLD, breast cancer with L radical mastectomy, CAD s/p angio and stenting of mLAD d/c 7/3.    PT Comments    Pt motivated to get OOB, states she is "bored". Pt participated well in repeated sit<>stands, pre-gait tasks (marches, step forward and back), and short distance gait with RW. Pt remains motivated to "walk on my own with the walker", pt progressing well with this but would benefit from intensive therapies. PT to continue to follow acutely.   Follow Up Recommendations  SNF     Equipment Recommendations  None recommended by PT (wait for follow up with rehab setting)    Recommendations for Other Services       Precautions / Restrictions Precautions Precautions: Fall Precaution Comments: L hemiparesis Restrictions Weight Bearing Restrictions: No    Mobility  Bed Mobility Overal bed mobility: Needs Assistance Bed Mobility: Rolling;Sidelying to Sit;Sit to Supine Rolling: Min assist Sidelying to sit: Min assist   Sit to supine: Min assist   General bed mobility comments: min assist for trunk rolling and elevation off of bed, lifting LLE back into bed, and boost up in bed with min bed pad assist and HOB flat.    Transfers Overall transfer level: Needs assistance Equipment used: Rolling walker (2 wheeled) Transfers: Sit to/from Stand Sit to Stand: Mod assist;Min assist         General transfer comment: Mod assist  initially for anterior trunk lean, power up, rise, steadying, and placement LUE on/off RW. transitioning to min assist with repeated standing trials. STS x5, from EOB each time. Cues for slow eccentric lower.  Ambulation/Gait Ambulation/Gait assistance: Mod assist Gait Distance (Feet): 5 Feet (+3) Assistive device: Rolling walker (2 wheeled) Gait Pattern/deviations: Step-through pattern;Decreased stride length;Trunk flexed;Narrow base of support;Wide base of support Gait velocity: decr   General Gait Details: mod assist to steady, correct L lateral leaning, and physically flexing hip/knee during swing phase LLE. Verbal cuing for upright posture, hip and knee flexion LLE as pt with tendency to keep LE locked in extension suspect partially due to extensor tone.   Stairs             Wheelchair Mobility    Modified Rankin (Stroke Patients Only) Modified Rankin (Stroke Patients Only) Pre-Morbid Rankin Score: No symptoms Modified Rankin: Moderately severe disability     Balance Overall balance assessment: Needs assistance Sitting-balance support: No upper extremity supported;Feet supported Sitting balance-Leahy Scale: Fair Sitting balance - Comments: supervision EOB statically, min guard dynamically Postural control: Right lateral lean;Posterior lean   Standing balance-Leahy Scale: Poor Standing balance comment: reliant on external support                            Cognition Arousal/Alertness: Awake/alert Behavior During Therapy: Impulsive Overall Cognitive Status: Impaired/Different from baseline Area of Impairment: Following commands;Attention;Problem solving                   Current Attention Level: Selective   Following  Commands: Follows one step commands consistently;Follows one step commands with increased time     Problem Solving: Slow processing;Difficulty sequencing;Requires verbal cues General Comments: requires cues to wait for PT  instruction before mobilizing, often times forgets to manage LUE on/off RW      Exercises General Exercises - Lower Extremity Hip Flexion/Marching: Both;AAROM;Standing;15 reps (x5 reps for first 3 stands) Other Exercises Other Exercises: PNF LE D2 flexion/extension against min PT resistance, x10    General Comments General comments (skin integrity, edema, etc.): vss      Pertinent Vitals/Pain Pain Assessment: Faces Faces Pain Scale: No hurt Pain Intervention(s): Limited activity within patient's tolerance;Monitored during session    Home Living                      Prior Function            PT Goals (current goals can now be found in the care plan section) Acute Rehab PT Goals Patient Stated Goal: go to rehab and go home PT Goal Formulation: With patient Time For Goal Achievement: 10/12/20 Potential to Achieve Goals: Good Progress towards PT goals: Progressing toward goals    Frequency    Min 4X/week      PT Plan Current plan remains appropriate    Co-evaluation              AM-PAC PT "6 Clicks" Mobility   Outcome Measure  Help needed turning from your back to your side while in a flat bed without using bedrails?: A Little Help needed moving from lying on your back to sitting on the side of a flat bed without using bedrails?: A Little Help needed moving to and from a bed to a chair (including a wheelchair)?: A Lot Help needed standing up from a chair using your arms (e.g., wheelchair or bedside chair)?: A Lot Help needed to walk in hospital room?: A Lot Help needed climbing 3-5 steps with a railing? : Total 6 Click Score: 13    End of Session   Activity Tolerance: Patient tolerated treatment well Patient left: in bed;with call bell/phone within reach;with bed alarm set Nurse Communication: Mobility status PT Visit Diagnosis: Other abnormalities of gait and mobility (R26.89);Hemiplegia and hemiparesis Hemiplegia - Right/Left: Left Hemiplegia  - dominant/non-dominant: Non-dominant Hemiplegia - caused by: Cerebral infarction     Time: 7510-2585 PT Time Calculation (min) (ACUTE ONLY): 25 min  Charges:  $Gait Training: 8-22 mins $Therapeutic Activity: 8-22 mins                     Stacie Glaze, PT DPT Acute Rehabilitation Services Pager [phone]  Office [phone]    [place] 10/08/2020, 10:41 AM

## 2020-10-09 ENCOUNTER — Encounter (HOSPITAL_COMMUNITY): Payer: Self-pay | Admitting: Internal Medicine

## 2020-10-09 ENCOUNTER — Other Ambulatory Visit: Payer: Self-pay

## 2020-10-09 ENCOUNTER — Encounter (HOSPITAL_COMMUNITY): Payer: Self-pay | Admitting: Physical Medicine & Rehabilitation

## 2020-10-09 ENCOUNTER — Inpatient Hospital Stay (HOSPITAL_COMMUNITY)
Admission: RE | Admit: 2020-10-09 | Discharge: 2020-11-05 | DRG: 056 | Disposition: A | Discharge status: Home / Self Care | Payer: Medicaid Other | Source: Intra-hospital | Attending: Physical Medicine & Rehabilitation | Admitting: Physical Medicine & Rehabilitation

## 2020-10-09 DIAGNOSIS — I959 Hypotension, unspecified: Secondary | ICD-10-CM | POA: Diagnosis not present

## 2020-10-09 DIAGNOSIS — I2510 Atherosclerotic heart disease of native coronary artery without angina pectoris: Secondary | ICD-10-CM | POA: Diagnosis present

## 2020-10-09 DIAGNOSIS — E119 Type 2 diabetes mellitus without complications: Secondary | ICD-10-CM | POA: Diagnosis present

## 2020-10-09 DIAGNOSIS — F419 Anxiety disorder, unspecified: Secondary | ICD-10-CM | POA: Diagnosis not present

## 2020-10-09 DIAGNOSIS — K5900 Constipation, unspecified: Secondary | ICD-10-CM | POA: Diagnosis present

## 2020-10-09 DIAGNOSIS — I6340 Cerebral infarction due to embolism of unspecified cerebral artery: Secondary | ICD-10-CM

## 2020-10-09 DIAGNOSIS — I69354 Hemiplegia and hemiparesis following cerebral infarction affecting left non-dominant side: Principal | ICD-10-CM

## 2020-10-09 DIAGNOSIS — T45515A Adverse effect of anticoagulants, initial encounter: Secondary | ICD-10-CM | POA: Diagnosis present

## 2020-10-09 DIAGNOSIS — I5042 Chronic combined systolic (congestive) and diastolic (congestive) heart failure: Secondary | ICD-10-CM | POA: Diagnosis present

## 2020-10-09 DIAGNOSIS — Z79899 Other long term (current) drug therapy: Secondary | ICD-10-CM | POA: Diagnosis not present

## 2020-10-09 DIAGNOSIS — I639 Cerebral infarction, unspecified: Secondary | ICD-10-CM | POA: Diagnosis present

## 2020-10-09 DIAGNOSIS — Z955 Presence of coronary angioplasty implant and graft: Secondary | ICD-10-CM

## 2020-10-09 DIAGNOSIS — I255 Ischemic cardiomyopathy: Secondary | ICD-10-CM | POA: Diagnosis present

## 2020-10-09 DIAGNOSIS — D6489 Other specified anemias: Secondary | ICD-10-CM | POA: Diagnosis present

## 2020-10-09 DIAGNOSIS — N179 Acute kidney failure, unspecified: Secondary | ICD-10-CM | POA: Diagnosis not present

## 2020-10-09 DIAGNOSIS — E785 Hyperlipidemia, unspecified: Secondary | ICD-10-CM | POA: Diagnosis present

## 2020-10-09 DIAGNOSIS — Z9012 Acquired absence of left breast and nipple: Secondary | ICD-10-CM | POA: Diagnosis not present

## 2020-10-09 DIAGNOSIS — I6310 Cerebral infarction due to embolism of unspecified precerebral artery: Secondary | ICD-10-CM

## 2020-10-09 DIAGNOSIS — I63111 Cerebral infarction due to embolism of right vertebral artery: Secondary | ICD-10-CM

## 2020-10-09 DIAGNOSIS — B961 Klebsiella pneumoniae [K. pneumoniae] as the cause of diseases classified elsewhere: Secondary | ICD-10-CM | POA: Diagnosis not present

## 2020-10-09 DIAGNOSIS — F17210 Nicotine dependence, cigarettes, uncomplicated: Secondary | ICD-10-CM | POA: Diagnosis present

## 2020-10-09 DIAGNOSIS — N390 Urinary tract infection, site not specified: Secondary | ICD-10-CM | POA: Diagnosis not present

## 2020-10-09 DIAGNOSIS — E876 Hypokalemia: Secondary | ICD-10-CM | POA: Diagnosis present

## 2020-10-09 DIAGNOSIS — R0602 Shortness of breath: Secondary | ICD-10-CM

## 2020-10-09 DIAGNOSIS — I214 Non-ST elevation (NSTEMI) myocardial infarction: Secondary | ICD-10-CM | POA: Diagnosis present

## 2020-10-09 DIAGNOSIS — I110 Hypertensive heart disease with heart failure: Secondary | ICD-10-CM | POA: Diagnosis present

## 2020-10-09 DIAGNOSIS — Z8249 Family history of ischemic heart disease and other diseases of the circulatory system: Secondary | ICD-10-CM | POA: Diagnosis not present

## 2020-10-09 HISTORY — DX: Cerebral infarction, unspecified: I63.9

## 2020-10-09 LAB — GLUCOSE, CAPILLARY
Glucose-Capillary: 144 mg/dL — ABNORMAL HIGH (ref 70–99)
Glucose-Capillary: 115 mg/dL — ABNORMAL HIGH (ref 70–99)
Glucose-Capillary: 165 mg/dL — ABNORMAL HIGH (ref 70–99)

## 2020-10-09 MED ORDER — POLYVINYL ALCOHOL 1.4 % OP SOLN
1.0000 [drp] | Freq: Three times a day (TID) | OPHTHALMIC | Status: DC
  Administered 2020-10-09 – 2020-11-05 (×52): 1 [drp] via OPHTHALMIC
  Filled 2020-10-09: qty 15

## 2020-10-09 MED ORDER — METOPROLOL SUCCINATE ER 25 MG PO TB24: 25.0000 mg | ORAL_TABLET | Freq: Every day | ORAL | 0 refills | Status: DC

## 2020-10-09 MED ORDER — PROCHLORPERAZINE 25 MG RE SUPP: 12.5000 mg | Freq: Four times a day (QID) | RECTAL | Status: DC | PRN

## 2020-10-09 MED ORDER — ASPIRIN EC 81 MG PO TBEC
81.0000 mg | DELAYED_RELEASE_TABLET | Freq: Every day | ORAL | Status: DC
  Administered 2020-10-10 – 2020-11-05 (×27): 81 mg via ORAL
  Filled 2020-10-09 (×27): qty 1

## 2020-10-09 MED ORDER — METOPROLOL SUCCINATE ER 25 MG PO TB24
25.0000 mg | ORAL_TABLET | Freq: Every day | ORAL | Status: DC
  Administered 2020-10-10 – 2020-10-12 (×3): 25 mg via ORAL
  Filled 2020-10-09 (×3): qty 1

## 2020-10-09 MED ORDER — SACUBITRIL-VALSARTAN 24-26 MG PO TABS
1.0000 | ORAL_TABLET | Freq: Two times a day (BID) | ORAL | Status: DC
  Administered 2020-10-09 – 2020-10-30 (×43): 1 via ORAL
  Filled 2020-10-09 (×44): qty 1

## 2020-10-09 MED ORDER — ACETAMINOPHEN 325 MG PO TABS
325.0000 mg | ORAL_TABLET | ORAL | Status: DC | PRN
  Filled 2020-10-09: qty 2

## 2020-10-09 MED ORDER — ALUM & MAG HYDROXIDE-SIMETH 200-200-20 MG/5ML PO SUSP: 30.0000 mL | ORAL | Status: DC | PRN

## 2020-10-09 MED ORDER — NITROGLYCERIN 0.4 MG SL SUBL: 0.4000 mg | SUBLINGUAL_TABLET | SUBLINGUAL | Status: DC | PRN

## 2020-10-09 MED ORDER — METFORMIN HCL 500 MG PO TABS
500.0000 mg | ORAL_TABLET | Freq: Two times a day (BID) | ORAL | Status: DC
  Administered 2020-10-09 – 2020-10-30 (×43): 500 mg via ORAL
  Filled 2020-10-09 (×44): qty 1

## 2020-10-09 MED ORDER — POLYETHYLENE GLYCOL 3350 17 G PO PACK
17.0000 g | PACK | Freq: Every day | ORAL | Status: DC | PRN
  Administered 2020-10-25: 17 g via ORAL
  Filled 2020-10-09 (×2): qty 1

## 2020-10-09 MED ORDER — ROSUVASTATIN CALCIUM 40 MG PO TABS: 40.0000 mg | ORAL_TABLET | Freq: Every day | ORAL | 0 refills | Status: DC

## 2020-10-09 MED ORDER — TRAZODONE HCL 50 MG PO TABS
25.0000 mg | ORAL_TABLET | Freq: Every evening | ORAL | Status: DC | PRN
  Administered 2020-11-04: 50 mg via ORAL
  Filled 2020-10-09: qty 1

## 2020-10-09 MED ORDER — SENNOSIDES-DOCUSATE SODIUM 8.6-50 MG PO TABS
2.0000 | ORAL_TABLET | Freq: Every day | ORAL | Status: DC
  Administered 2020-10-09 – 2020-10-13 (×5): 2 via ORAL
  Filled 2020-10-09 (×6): qty 2

## 2020-10-09 MED ORDER — PROCHLORPERAZINE MALEATE 5 MG PO TABS: 5.0000 mg | ORAL_TABLET | Freq: Four times a day (QID) | ORAL | Status: DC | PRN

## 2020-10-09 MED ORDER — SACUBITRIL-VALSARTAN 24-26 MG PO TABS
1.0000 | ORAL_TABLET | Freq: Two times a day (BID) | ORAL | Status: DC
  Administered 2020-10-09: 1 via ORAL
  Filled 2020-10-09: qty 1

## 2020-10-09 MED ORDER — GUAIFENESIN-DM 100-10 MG/5ML PO SYRP: 5.0000 mL | ORAL_SOLUTION | Freq: Four times a day (QID) | ORAL | Status: DC | PRN

## 2020-10-09 MED ORDER — METFORMIN HCL 500 MG PO TABS: 500.0000 mg | ORAL_TABLET | Freq: Two times a day (BID) | ORAL | Status: DC

## 2020-10-09 MED ORDER — DIPHENHYDRAMINE HCL 12.5 MG/5ML PO ELIX: 12.5000 mg | ORAL_SOLUTION | Freq: Four times a day (QID) | ORAL | Status: DC | PRN

## 2020-10-09 MED ORDER — POLYVINYL ALCOHOL 1.4 % OP SOLN
1.0000 [drp] | Freq: Every day | OPHTHALMIC | Status: DC | PRN
  Filled 2020-10-09: qty 15

## 2020-10-09 MED ORDER — PROCHLORPERAZINE EDISYLATE 10 MG/2ML IJ SOLN: 5.0000 mg | Freq: Four times a day (QID) | INTRAMUSCULAR | Status: DC | PRN

## 2020-10-09 MED ORDER — FAMOTIDINE 20 MG PO TABS
20.0000 mg | ORAL_TABLET | Freq: Every day | ORAL | Status: DC
  Administered 2020-10-10 – 2020-11-05 (×27): 20 mg via ORAL
  Filled 2020-10-09 (×27): qty 1

## 2020-10-09 MED ORDER — CALCIUM CARBONATE ANTACID 500 MG PO CHEW: 2.0000 | CHEWABLE_TABLET | Freq: Every day | ORAL | Status: DC | PRN

## 2020-10-09 MED ORDER — TICAGRELOR 90 MG PO TABS
90.0000 mg | ORAL_TABLET | Freq: Two times a day (BID) | ORAL | Status: DC
  Administered 2020-10-09 – 2020-11-05 (×54): 90 mg via ORAL
  Filled 2020-10-09 (×54): qty 1

## 2020-10-09 MED ORDER — BISACODYL 10 MG RE SUPP: 10.0000 mg | Freq: Every day | RECTAL | Status: DC | PRN

## 2020-10-09 MED ORDER — INSULIN ASPART 100 UNIT/ML IJ SOLN
0.0000 [IU] | Freq: Three times a day (TID) | INTRAMUSCULAR | Status: DC
  Administered 2020-10-10 – 2020-10-14 (×9): 1 [IU] via SUBCUTANEOUS
  Administered 2020-10-15 – 2020-10-16 (×2): 2 [IU] via SUBCUTANEOUS
  Administered 2020-10-17: 1 [IU] via SUBCUTANEOUS
  Administered 2020-10-17: 6 [IU] via SUBCUTANEOUS
  Administered 2020-10-18 (×2): 1 [IU] via SUBCUTANEOUS
  Administered 2020-10-18 – 2020-10-19 (×3): 2 [IU] via SUBCUTANEOUS
  Administered 2020-10-21 – 2020-10-22 (×5): 1 [IU] via SUBCUTANEOUS
  Administered 2020-10-23: 2 [IU] via SUBCUTANEOUS
  Administered 2020-10-24 (×2): 1 [IU] via SUBCUTANEOUS
  Administered 2020-10-25: 2 [IU] via SUBCUTANEOUS
  Administered 2020-10-25 – 2020-10-28 (×7): 1 [IU] via SUBCUTANEOUS
  Administered 2020-10-29 – 2020-10-30 (×2): 2 [IU] via SUBCUTANEOUS
  Administered 2020-10-31 (×2): 1 [IU] via SUBCUTANEOUS
  Administered 2020-11-01 (×2): 2 [IU] via SUBCUTANEOUS
  Administered 2020-11-02 (×2): 1 [IU] via SUBCUTANEOUS
  Administered 2020-11-03 (×2): 2 [IU] via SUBCUTANEOUS
  Administered 2020-11-03 – 2020-11-04 (×2): 1 [IU] via SUBCUTANEOUS
  Administered 2020-11-04: 2 [IU] via SUBCUTANEOUS
  Administered 2020-11-04 – 2020-11-05 (×2): 1 [IU] via SUBCUTANEOUS

## 2020-10-09 MED ORDER — ENOXAPARIN SODIUM 40 MG/0.4ML IJ SOSY
40.0000 mg | PREFILLED_SYRINGE | INTRAMUSCULAR | Status: DC
  Administered 2020-10-09 – 2020-10-19 (×10): 40 mg via SUBCUTANEOUS
  Filled 2020-10-09 (×11): qty 0.4

## 2020-10-09 MED ORDER — FLEET ENEMA 7-19 GM/118ML RE ENEM: 1.0000 | ENEMA | Freq: Once | RECTAL | Status: DC | PRN

## 2020-10-09 MED ORDER — GLYCERIN-HYPROMELLOSE-PEG 400 0.2-0.2-1 % OP SOLN: 1.0000 [drp] | Freq: Every day | OPHTHALMIC | Status: DC | PRN

## 2020-10-09 MED ORDER — ROSUVASTATIN CALCIUM 20 MG PO TABS
40.0000 mg | ORAL_TABLET | Freq: Every day | ORAL | Status: DC
  Administered 2020-10-10 – 2020-11-05 (×27): 40 mg via ORAL
  Filled 2020-10-09 (×27): qty 2

## 2020-10-09 MED ORDER — INSULIN ASPART 100 UNIT/ML IJ SOLN: 0.0000 [IU] | Freq: Every day | INTRAMUSCULAR | Status: DC

## 2020-10-09 NOTE — Discharge Summary (Signed)
Physician Discharge Summary  JAKYAH BRADBY OBS:[id number] DOB: [birth year] DOA: 09/27/2020  PCP: Merryl Hacker, No  Admit date: 09/27/2020 Discharge date: 10/09/2020  Time spent: 40 minutes  Recommendations for Outpatient Follow-up:  Follow outpatient CBC/CMP Follow cardiac event monitor with cardiology outpatient  Follow with vascular surgery for carotid stenosis outpatient Follow with neurology outpatient Follow with cardiology outpatient - continue to adjust GDMT as tolerated  BP goal 130-150 long term due to posterior circulation vascular stenosis Follow blood sugars  Discharge Diagnoses:  Principal Problem:   Acute ischemic stroke [facility]) Active Problems:   HTN (hypertension)   Hyperlipidemia   STEMI involving left anterior descending coronary artery ([place])   Tobacco abuse   Cardiomyopathy, ischemic   S/P angioplasty with stent to mLAD 09/22/20    DM (diabetes mellitus), type 2 ([place])   HFrEF (heart failure with reduced ejection fraction) ([place])   CKD (chronic kidney disease), stage III ([place])   Discharge Condition: stable  Diet recommendation: heart healthy, diabetic   Filed Weights   09/30/20 0438 10/01/20 0500 10/02/20 0326  Weight: 69.5 kg 69 kg 70.1 kg    History of present illness:  MICHALLE RADEMAKER is Amandine Covino 60 y/o F with CAD (recent stent to the LAD on 7/4), ischemic cardiomyopathy with EF of 35-40%, HTN, DM2, HLD and smoking who presented who was discharged on 7/4 after treatment for STEMI.  She represented after developing L sided weakness and slurred speech and represented with doe, PND, orthopnea.  She was found to have an acute stroke.  Neurology and cardiology were consulted.  Workup complete at this point, plan is for CIR.   See below for additional details  Hospital Course:  Acute ischemic strokes - per neurology, likely cardioembolic related to cardiomyopathy with low EF - symptoms occurred on the night of 7/4 after she got home from the hospital - MRI brain with  multiple scattered acute to early subacute ischemic nonhemorrhagic infarcts involving bilateral cerebral and cerebellar hemispheres with largest area of infarction measuring up to 2 cm at R occipital cortex - central thromboembolic etiology suspected.  Underlying moderate chronic microvascular disease with Mckinleigh Schuchart few scattered remote lacunar infarcts involving the L thalamus and pons. - MRA head technically limited due to motion artifact, negative for LVO.  Intracranial atherosclerotic disease with associated moderate stneoses involving the distal L V4 segment, distal basilar artery, and carvernous R ICA.  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 MRA neck limited due to motion artifact, 70% stenosis at origin of L ICA.  Otherwise negative MRA of neck. - LDL is 130-Crestor started after MI-  increased from 10 mg this admission to 40 mg daily - A1c is 6.8 - 2D echo does not reveal Maximilian Tallo thrombus   - TEE with no left atrial/L atrial appendage thrombus.  Positive intrapulmonary shunting. - Neurology recommending to continue 81 mg aspirin and Brilinta per cards recs - needs 30 day event monitor to r/o atrial fib - notified card master of d/c and need for event monitor/appt - SLP eval done- regular, thin liquids - SLUMs is 17/30 - 7/11> - MBS completed and SLP recommending regular diet and thin liquids,   - D/c to CIR today   Left ICA stenosis - MRA>> Approximate 70% atheromatous stenosis at the origin of the left ICA - Previous provider spoke with vascular surgery. Dr Carlis Abbott to get an appt so she does not fall through the cracks - added to d/c instructions  Chronic systolic heart failure (mild) CAD with NSTEMI and stent in mid LAD on 09/22/2020 -Echo 09/22/2020> EF 35 to 40%, mild LVH, grade 1 diastolic dysfunction, severe hypokinesis of the LV entire anterior wall anteroseptal wall apical segment and inferior apical segment. -Echo 09/28/2020> EF 30 to 09%, grade 2 diastolic  dysfunction -Given 20 mg of IV Lasix on 7/7 and no diuretics since - Continue aspirin, Brilinta, Crestor - GDMT per cardiology - adjust as tolerated with BP goal in mind below - continue metoprolol and entrestoo -appreciate cardiology recs   Hypertension - continue metop, entresto  - gradually normalize BP - long term goal 130-150 given posterior circulation vascular stenosis per neuro   Urinary retention - 7/8 Patient had the urge to urinate but was unable to-bladder scan revealed 700 cc- In-N-Out cath done and 600 cc of urine obtained  - no further I and O caths needed   Small amounts of pink discharge in bed pan - follow - w/u further as indicated - UA from 7/8 with no RBC's   CKD 3B - Cr has mostly been ~ 1.0-1.2  in hospital - Cr was 0.80 on 7/2     DM (diabetes mellitus), type 2 ([place]) -On metformin at home -A1c 6.8 -SSI   Tobacco abuse - Smoking cessation counseling given  Procedures: Echo (TEE) IMPRESSIONS     1. Akinesis of the anteroseptal wall and apex with overall moderate to  severe LV dysfunction; no obvious apical thrombus noted using definity.   2. Left ventricular ejection fraction, by estimation, is 30 to 35%. The  left ventricle has moderate to severely decreased function. The left  ventricle demonstrates regional wall motion abnormalities (see scoring  diagram/findings for description). The  left ventricular internal cavity size was mildly dilated.   3. Right ventricular systolic function is normal. The right ventricular  size is normal.   4. Left atrial size was mildly dilated. No left atrial/left atrial  appendage thrombus was detected.   5. Holger Sokolowski small pericardial effusion is present.   6. The mitral valve is normal in structure. Mild mitral valve  regurgitation.   7. The aortic valve is tricuspid. Aortic valve regurgitation is not  visualized.   8. There is mild (Grade II) plaque involving the descending aorta.   9. Agitated saline contrast  bubble study was positive with shunting  observed after >6 cardiac cycles suggestive of intrapulmonary shunting.   Echo IMPRESSIONS     1. Left ventricular ejection fraction, by estimation, is 30 to 35%. The  left ventricle has moderately decreased function. The left ventricle  demonstrates regional wall motion abnormalities (see scoring  diagram/findings for description). There is mild  left ventricular hypertrophy. Left ventricular diastolic parameters are  consistent with Grade II diastolic dysfunction (pseudonormalization).  Elevated left atrial pressure.   2. No LV thrombus seen   3. Right ventricular systolic function is normal. The right ventricular  size is normal. Tricuspid regurgitation signal is inadequate for assessing  PA pressure.   4. The mitral valve is normal in structure. Trivial mitral valve  regurgitation.   5. The aortic valve was not well visualized. Aortic valve regurgitation  is not visualized. No aortic stenosis is present.   6. The inferior vena cava is normal in size with greater than 50%  respiratory variability, suggesting right atrial pressure of 3 mmHg.  Consultations: Neurology cardiology  Discharge Exam: Vitals:   10/09/20 0809 10/09/20 1030  BP: (!) 133/94 130/86  Pulse: 78 71  Resp:  16  Temp:    SpO2:  98%   Eager for d/c to CIR No new complaints  General: No acute distress. Cardiovascular: Heart sounds show Sartaj Hoskin regular rate, and rhythm. Lungs: Clear to auscultation bilaterally Abdomen: Soft, nontender, nondistended  Neurological: L sided weakness, antigravity, has been improving gradually Skin: Warm and dry. No rashes or lesions. Extremities: No clubbing or cyanosis. No edema.   Discharge Instructions   Discharge Instructions     (HEART FAILURE PATIENTS) Call MD:  Anytime you have any of the following symptoms: 1) 3 pound weight gain in 24 hours or 5 pounds in 1 week 2) shortness of breath, with or without Zania Kalisz dry hacking cough 3)  swelling in the hands, feet or stomach 4) if you have to sleep on extra pillows at night in order to breathe.   Complete by: As directed    Ambulatory referral to Neurology   Complete by: As directed    Follow up with stroke clinic NP (Jessica Vanschaick or Cecille Rubin, if both not available, consider Zachery Dauer, or Ahern) at [facility] in about 4 weeks. Thanks.   Call MD for:  difficulty breathing, headache or visual disturbances   Complete by: As directed    Call MD for:  extreme fatigue   Complete by: As directed    Call MD for:  hives   Complete by: As directed    Call MD for:  persistant dizziness or light-headedness   Complete by: As directed    Call MD for:  persistant nausea and vomiting   Complete by: As directed    Call MD for:  redness, tenderness, or signs of infection (pain, swelling, redness, odor or green/yellow discharge around incision site)   Complete by: As directed    Call MD for:  severe uncontrolled pain   Complete by: As directed    Call MD for:  temperature >100.4   Complete by: As directed    Diet - low sodium heart healthy   Complete by: As directed    Discharge instructions   Complete by: As directed    You were seen for Nyanna Heideman stroke.   This was thought to be cardioembolic in etiology related to your low ejection fraction (heart failure).  You were seen by neurology and continued on aspirin and brilinta.  You will need an outpatient cardiac event monitor to watch for abnormal heart rhythms, like atrial fibrillation.    Follow up with cardiology outpatient for your heart attack and heart failure.  They'll continue to adjust medicines as needed.  Follow up with neurology outpatient for your stroke.  Follow up with vascular surgery for your carotid artery disease.  [facility].  Return for new, recurrent, or worsening symptoms.  Please ask your PCP to request records from this  hospitalization so they know what was done and what the next steps will be.   Increase activity slowly   Complete by: As directed       Allergies as of 10/09/2020   No Known Allergies      Medication List     STOP taking these medications    carvedilol 12.5 MG tablet Commonly known as: COREG   spironolactone 25 MG tablet Commonly known as: ALDACTONE       TAKE these medications    acetaminophen 325 MG tablet Commonly known as: TYLENOL Take 2 tablets (650 mg total) by mouth every 4 (four) hours as  needed for headache or mild pain.   aspirin 81 MG EC tablet Take 1 tablet (81 mg total) by mouth daily. Swallow whole.   calcium carbonate 500 MG chewable tablet Commonly known as: TUMS - dosed in mg elemental calcium Chew 2 tablets by mouth daily as needed for indigestion or heartburn.   metFORMIN 500 MG tablet Commonly known as: GLUCOPHAGE Take 1 tablet (500 mg total) by mouth 2 (two) times daily with Eugenio Dollins meal.   metoprolol succinate 25 MG 24 hr tablet Commonly known as: TOPROL-XL Take 1 tablet (25 mg total) by mouth daily. Start taking on: October 10, 2020   nitroGLYCERIN 0.4 MG SL tablet Commonly known as: NITROSTAT Place 1 tablet (0.4 mg total) under the tongue every 5 (five) minutes x 3 doses as needed for chest pain.   rosuvastatin 40 MG tablet Commonly known as: CRESTOR Take 1 tablet (40 mg total) by mouth daily. Start taking on: October 10, 2020 What changed:  medication strength how much to take   sacubitril-valsartan 24-26 MG Commonly known as: ENTRESTO Take 1 tablet by mouth 2 (two) times daily.   ticagrelor 90 MG Tabs tablet Commonly known as: BRILINTA Take 1 tablet (90 mg total) by mouth 2 (two) times daily.   VISINE DRY EYE OP Place 1 drop into both eyes daily as needed (dry eyes).       No Known Allergies  Follow-up Information     [facility].   Specialty: Family Medicine Why: Please call the office to make an  appointment to come in and get scheduled for primary care needs. Contact information: [place] [address] [phone]         [facility]. Schedule an appointment as soon as possible for Jaxsen Bernhart visit in 1 month(s).   Specialty: Neurology Why: stroke clinic Contact information: [address] [place] [phone]        [place], Peter M, MD Follow up.   Specialty: Cardiology Contact information: [address] [place] [address] [phone]         [facility] Follow up.   Specialty: Vascular Surgery Why: call for Minor Iden follow up appointment for your carotid artery disease Contact information: [address] [place] [phone]                 The results of significant diagnostics from this hospitalization (including imaging, microbiology, ancillary and laboratory) are listed below for reference.    Significant Diagnostic Studies: CT ANGIO HEAD NECK W WO CM  Result Date: 10/01/2020 CLINICAL DATA:  Follow-up stroke. Left-sided weakness. 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 EXAM: CT ANGIOGRAPHY HEAD AND NECK TECHNIQUE: Multidetector CT imaging of the head and neck was performed using the standard protocol during bolus administration of intravenous contrast. Multiplanar CT image reconstructions and MIPs were obtained to evaluate the vascular anatomy. Carotid stenosis measurements (when applicable) are obtained utilizing NASCET criteria, using the distal internal carotid diameter as the denominator. CONTRAST:  8mL OMNIPAQUE IOHEXOL 350 MG/ML SOLN COMPARISON:  MRI studies 09/28/2020 FINDINGS: CT HEAD FINDINGS Brain: Acute infarction at the pontomedullary junction shown by MRI is difficult to appreciate by CT. No focal cerebellar finding. Low-density in  the right occipital lobe consistent with the 2 cm acute infarction in that region. Other punctate acute infarctions scattered within both hemispheres cannot be specifically appreciated  by CT. Chronic small-vessel ischemic changes are present throughout the cerebral hemispheric white matter. No hemorrhage, hydrocephalus or extra-axial collection. Vascular: There is atherosclerotic calcification of the major vessels at the base of the brain. Skull: Negative Sinuses: Clear/normal Orbits: Normal Review of the MIP images confirms the above findings CTA NECK FINDINGS Aortic arch: No aortic atherosclerotic calcification is seen. No dissection. Branching pattern is normal without origin stenosis. Right carotid system: Common carotid artery widely patent to the bifurcation. There is calcified plaque at the carotid bifurcation but without stenosis. Left carotid system: Common carotid artery widely patent to the bifurcation. There is calcified plaque at the carotid bifurcation and ICA bulb but no stenosis. Vertebral arteries: Both vertebral artery origins are widely patent. Both vertebral arteries appear normal through the cervical region to the foramen magnum. Skeleton: Minimal cervical spondylosis. Other neck: No mass or lymphadenopathy. Upper chest: Normal Review of the MIP images confirms the above findings CTA HEAD FINDINGS Anterior circulation: Both internal carotid arteries are patent through the skull base and siphon regions. The anterior and middle cerebral vessels are patent. No large vessel occlusion or correctable proximal stenosis. No aneurysm or vascular malformation. Posterior circulation: Left vertebral artery is dominant and is widely patent through the foramen magnum. Mild narrowing in the distal V4 segment, but the vessel is patent to the basilar. Right vertebral artery either terminates in PICA or is occluded beyond PICA. Proximal basilar artery shows mild atherosclerotic irregularity. Superior cerebellar  and posterior cerebral arteries are patent. Venous sinuses: Patent and normal. Anatomic variants: None significant otherwise. Review of the MIP images confirms the above findings IMPRESSION: The aorta has Arjuna Doeden normal appearance. Mild atherosclerotic plaque at both carotid bifurcations but without stenosis on either side. No evidence of ulceration or pronounced irregularity. Mild atherosclerotic change at the carotid siphons but without stenosis. No intracranial large or medium vessel occlusion in the anterior circulation. Right vertebral artery either terminates in PICA or is occluded distal to PICA. Left vertebral artery shows Brigitta Pricer moderate stenosis of the distal V4 segment but does remain patent to the basilar. Mild atherosclerotic irregularity of the proximal basilar artery. Electronically Signed   By: Nelson Chimes M.D.   On: 10/01/2020 19:34   DG Chest 2 View  Result Date: 09/23/2020 CLINICAL DATA:  Question right lung pneumonia on outside radiograph. EXAM: CHEST - 2 VIEW COMPARISON:  Radiograph yesterday at [facility]. FINDINGS: Lung volumes are low. Borderline cardiomegaly is similar. There is improved right basilar aeration with mild residual atelectasis. No new airspace disease. There is minimal fluid in the fissures without large subpulmonic effusion. No pneumothorax. Stable osseous structures. IMPRESSION: 1. Improved aeration at the right lung base with mild residual atelectasis. No new airspace disease. 2. Trace fluid in the fissures.  Borderline cardiomegaly. Electronically Signed   By: Keith Rake M.D.   On: 09/23/2020 17:51   CT HEAD WO CONTRAST  Result Date: 09/27/2020 CLINICAL DATA:  Left-sided weakness. EXAM: CT HEAD WITHOUT CONTRAST TECHNIQUE: Contiguous axial images were obtained from the base of the skull through the vertex without intravenous contrast. COMPARISON:  None. FINDINGS: Brain: 18 x 16 x 17 mm focus of peripheral hypo attenuation is identified in posterior right  parietooccipital region (image 15/series 2). No evidence for acute hemorrhage, hydrocephalus, or abnormal extra-axial fluid collection. Patchy low attenuation in the deep hemispheric and periventricular white matter is nonspecific, but likely reflects chronic microvascular ischemic demyelination. Vascular: No hyperdense vessel or unexpected calcification. Skull: No evidence for fracture. No worrisome  lytic or sclerotic lesion. Sinuses/Orbits: The visualized paranasal sinuses and mastoid air cells are clear. Visualized portions of the globes and intraorbital fat are unremarkable. Other: None. IMPRESSION: 1. 18 x 16 x 17 mm focus of peripheral hypo attenuation in the posterior right parietooccipital region. This is nonspecific and could be related to subacute infarct, but given the somewhat rounded configuration, MRI of the brain without and with contrast recommended to further evaluate. 2. Chronic small vessel white matter ischemic disease. Electronically Signed   By: Misty Stanley M.D.   On: 09/27/2020 19:42   MR ANGIO HEAD WO CONTRAST  Result Date: 09/28/2020 CLINICAL DATA:  Initial evaluation for neuro deficit, stroke suspected, left-sided weakness. EXAM: MRI HEAD WITHOUT AND WITH CONTRAST MRA HEAD WITHOUT CONTRAST MRA NECK WITHOUT AND WITH CONTRAST TECHNIQUE: Multiplanar, multi-echo pulse sequences of the brain and surrounding structures were acquired without intravenous contrast. Angiographic images of the Circle of Willis were acquired using MRA technique without intravenous contrast. Angiographic images of the neck were acquired using MRA technique without and with intravenous contrast. Carotid stenosis measurements (when applicable) are obtained utilizing NASCET criteria, using the distal internal carotid diameter as the denominator. CONTRAST:  78mL GADAVIST GADOBUTROL 1 MMOL/ML IV SOLN COMPARISON:  Prior CT from 09/27/2020. FINDINGS: MRI HEAD FINDINGS Brain: Examination moderately degraded by motion  artifact. Cerebral volume within normal limits. Patchy T2/FLAIR hyperintensity seen involving the periventricular deep white matter of both cerebral hemispheres, with patchy involvement of the thalami and pons, most consistent with chronic microvascular ischemic disease, moderate in nature. Few scatter remote lacunar infarcts present at the left thalamus and pons. Multiple scattered foci of restricted diffusion seen involving the bilateral cerebral and cerebellar hemispheres, consistent with acute to early subacute ischemic infarcts. The largest infarct involving the left cerebral hemisphere seen involving the cortical and subcortical posterior left frontal lobe in measures 1.4 cm (series 7, image 51). Largest infarct involving the right cerebral hemisphere involves the right occipital cortex and measures 2 cm (series 5, image 74). This accounts for the hypodensity seen on prior CT. Few small cerebellar infarcts measure up to 9 mm on the right. Patchy infarct measuring 1.2 cm seen involving the central/right paracentral medulla (series 5, image 62). No associated hemorrhage or mass effect. Minimal associated enhancement seen about the dominant right occipital infarct. No mass lesion, midline shift, or significant mass effect. No hydrocephalus or extra-axial fluid collection. Pituitary gland suprasellar region within normal limits. Midline structures intact. No other abnormal enhancement. Vascular: Major intracranial vascular flow voids are maintained. Skull and upper cervical spine: Craniocervical junction within normal limits. Bone marrow signal intensity normal. No scalp soft tissue abnormality. Sinuses/Orbits: Globes and orbital soft tissues demonstrate no acute finding. Mild scattered mucosal thickening noted within the ethmoidal air cells. Paranasal sinuses are otherwise clear. Trace bilateral mastoid effusions noted, of doubtful significance. Inner ear structures grossly normal. Other: None. MRA HEAD FINDINGS  Anterior circulation: Examination degraded by motion artifact. Visualized distal cervical segments of the internal carotid arteries are patent with antegrade flow. Petrous segments patent bilaterally. Atheromatous irregularity throughout the carotid siphons bilaterally. No significant stenosis on the left. On the right, there is Lakoda Raske probable moderate stenosis involving the proximal cavernous right ICA, better seen on corresponding MRA neck portion of this study (series 4, image 30). A1 segments patent bilaterally. Normal anterior communicating artery complex. Anterior cerebral arteries grossly patent to their distal aspects. No visible M1 stenosis or occlusion. Normal MCA bifurcations. Distal MCA branches well perfused and fairly symmetric.  Posterior circulation: Left vertebral artery dominant. Irregular moderate stenosis seen involving the distal left V4 segment just prior to the vertebrobasilar junction, better seen on MRA neck portion of this exam (series 2, image 54). Left PICA not visualized. Right vertebral artery diminutive occludes beyond the takeoff of the right PICA. Partially visualized proximal right PICA patent. Basilar patent proximally. Apparent short-segment severe stenosis involving the mid basilar artery on MIP reconstructions is not seen on corresponding time-of-flight sequence, favored to be artifactual. Mild to moderate stenosis noted at the basilar tip (series 2, image 44). Superior cerebellar arteries patent bilaterally. Right PCA primarily supplied via the basilar. Left PCA supplied via the basilar as well as Lanay Zinda robust left posterior communicating artery. PCAs perfused to their distal aspects without definite high-grade stenosis. No visible aneurysm on this motion degraded exam. Anatomic variants: Diminutive right vertebral artery largely terminates in PICA. Prominent left posterior communicating artery. MRA NECK FINDINGS Aortic arch: Examination moderately degraded by motion. Visualized aortic  arch normal caliber with normal branch pattern. No hemodynamically significant stenosis seen about the origin of the great vessels. Right carotid system: Right CCA patent from its origin to the bifurcation without stenosis. Atheromatous irregularity seen about the right carotid bulb/proximal right ICA with no more than mild 20% stenosis by NASCET criteria. Right ICA patent distally without stenosis, evidence for dissection, or occlusion. Left carotid system: Left CCA patent from its origin to the bifurcation without stenosis. Atheromatous irregularity with up to 70% short-segment stenosis at the origin of the left ICA (series 1060, image 3). Left ICA patent distally without additional stenosis, evidence for dissection or occlusion. Vertebral arteries: Both vertebral arteries arise from subclavian arteries. Vertebral arteries are patent without hemodynamically significant stenosis. No evidence for dissection or occlusion. Other: None IMPRESSION: MRI HEAD: 1. 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 Given the various vascular distributions involved, Rosendo Couser central thromboembolic etiology is suspected. 2. Underlying moderate chronic microvascular ischemic disease with Mosi Hannold few scattered remote lacunar infarcts involving the left thalamus and pons. MRA HEAD: 1. Technically limited exam due to motion artifact. 2. Negative intracranial MRA for large vessel occlusion. Intracranial atherosclerotic disease with associated moderate stenoses involving the distal left V4 segment, distal basilar artery, and cavernous right ICA as above. 3. 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 MRA NECK: 1. Technically limited exam due to motion artifact. 2. Approximate 70% atheromatous stenosis at the origin of the left ICA. 3.  Otherwise negative MRA of the neck. No other hemodynamically significant or correctable stenosis identified. Electronically Signed   By: Jeannine Boga M.D.   On: 09/28/2020 03:18   MR ANGIO NECK W WO CONTRAST  Result Date: 09/28/2020 CLINICAL DATA:  Initial evaluation for neuro deficit, stroke suspected, left-sided weakness. EXAM: MRI HEAD WITHOUT AND WITH CONTRAST MRA HEAD WITHOUT CONTRAST MRA NECK WITHOUT AND WITH CONTRAST TECHNIQUE: Multiplanar, multi-echo pulse sequences of the brain and surrounding structures were acquired without intravenous contrast. Angiographic images of the Circle of Willis were acquired using MRA technique without intravenous contrast. Angiographic images of the neck were acquired using MRA technique without and with intravenous contrast. Carotid stenosis measurements (when applicable) are obtained utilizing NASCET criteria, using the distal internal carotid diameter as the denominator. CONTRAST:  28mL GADAVIST GADOBUTROL 1 MMOL/ML IV SOLN COMPARISON:  Prior CT from 09/27/2020. FINDINGS:  MRI HEAD FINDINGS Brain: Examination moderately degraded by motion artifact. Cerebral volume within normal limits. Patchy T2/FLAIR hyperintensity seen involving the periventricular deep white matter of both cerebral hemispheres, with patchy involvement of the thalami and pons, most consistent with chronic microvascular ischemic disease, moderate in nature. Few scatter remote lacunar infarcts present at the left thalamus and pons. Multiple scattered foci of restricted diffusion seen involving the bilateral cerebral and cerebellar hemispheres, consistent with acute to early subacute ischemic infarcts. The largest infarct involving the left cerebral hemisphere seen involving the cortical and subcortical posterior left frontal lobe in measures 1.4 cm (series 7, image 51). Largest infarct involving the right cerebral hemisphere involves the right occipital cortex and measures 2 cm (series 5, image  74). This accounts for the hypodensity seen on prior CT. Few small cerebellar infarcts measure up to 9 mm on the right. Patchy infarct measuring 1.2 cm seen involving the central/right paracentral medulla (series 5, image 62). No associated hemorrhage or mass effect. Minimal associated enhancement seen about the dominant right occipital infarct. No mass lesion, midline shift, or significant mass effect. No hydrocephalus or extra-axial fluid collection. Pituitary gland suprasellar region within normal limits. Midline structures intact. No other abnormal enhancement. Vascular: Major intracranial vascular flow voids are maintained. Skull and upper cervical spine: Craniocervical junction within normal limits. Bone marrow signal intensity normal. No scalp soft tissue abnormality. Sinuses/Orbits: Globes and orbital soft tissues demonstrate no acute finding. Mild scattered mucosal thickening noted within the ethmoidal air cells. Paranasal sinuses are otherwise clear. Trace bilateral mastoid effusions noted, of doubtful significance. Inner ear structures grossly normal. Other: None. MRA HEAD FINDINGS Anterior circulation: Examination degraded by motion artifact. Visualized distal cervical segments of the internal carotid arteries are patent with antegrade flow. Petrous segments patent bilaterally. Atheromatous irregularity throughout the carotid siphons bilaterally. No significant stenosis on the left. On the right, there is Wynonna Fitzhenry probable moderate stenosis involving the proximal cavernous right ICA, better seen on corresponding MRA neck portion of this study (series 4, image 30). A1 segments patent bilaterally. Normal anterior communicating artery complex. Anterior cerebral arteries grossly patent to their distal aspects. No visible M1 stenosis or occlusion. Normal MCA bifurcations. Distal MCA branches well perfused and fairly symmetric. Posterior circulation: Left vertebral artery dominant. Irregular moderate stenosis seen  involving the distal left V4 segment just prior to the vertebrobasilar junction, better seen on MRA neck portion of this exam (series 2, image 54). Left PICA not visualized. Right vertebral artery diminutive occludes beyond the takeoff of the right PICA. Partially visualized proximal right PICA patent. Basilar patent proximally. Apparent short-segment severe stenosis involving the mid basilar artery on MIP reconstructions is not seen on corresponding time-of-flight sequence, favored to be artifactual. Mild to moderate stenosis noted at the basilar tip (series 2, image 44). Superior cerebellar arteries patent bilaterally. Right PCA primarily supplied via the basilar. Left PCA supplied via the basilar as well as Vinal Rosengrant robust left posterior communicating artery. PCAs perfused to their distal aspects without definite high-grade stenosis. No visible aneurysm on this motion degraded exam. Anatomic variants: Diminutive right vertebral artery largely terminates in PICA. Prominent left posterior communicating artery. MRA NECK FINDINGS Aortic arch: Examination moderately degraded by motion. Visualized aortic arch normal caliber with normal branch pattern. No hemodynamically significant stenosis seen about the origin of the great vessels. Right carotid system: Right CCA patent from its origin to the bifurcation without stenosis. Atheromatous irregularity seen about the right carotid bulb/proximal right ICA with no more than mild 20%  stenosis by NASCET criteria. Right ICA patent distally without stenosis, evidence for dissection, or occlusion. Left carotid system: Left CCA patent from its origin to the bifurcation without stenosis. Atheromatous irregularity with up to 70% short-segment stenosis at the origin of the left ICA (series 1060, image 3). Left ICA patent distally without additional stenosis, evidence for dissection or occlusion. Vertebral arteries: Both vertebral arteries arise from subclavian arteries. Vertebral arteries  are patent without hemodynamically significant stenosis. No evidence for dissection or occlusion. Other: None IMPRESSION: MRI HEAD: 1. 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 Given the various vascular distributions involved, Grady Lucci central thromboembolic etiology is suspected. 2. Underlying moderate chronic microvascular ischemic disease with Kahlea Cobert few scattered remote lacunar infarcts involving the left thalamus and pons. MRA HEAD: 1. Technically limited exam due to motion artifact. 2. Negative intracranial MRA for large vessel occlusion. Intracranial atherosclerotic disease with associated moderate stenoses involving the distal left V4 segment, distal basilar artery, and cavernous right ICA as above. 3. 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 MRA NECK: 1. Technically limited exam due to motion artifact. 2. Approximate 70% atheromatous stenosis at the origin of the left ICA. 3. Otherwise negative MRA of the neck. No other hemodynamically significant or correctable stenosis identified. Electronically Signed   By: Jeannine Boga M.D.   On: 09/28/2020 03:18   MR Brain W and Wo Contrast  Result Date: 09/28/2020 CLINICAL DATA:  Initial evaluation for neuro deficit, stroke suspected, left-sided weakness. EXAM: MRI HEAD WITHOUT AND WITH CONTRAST MRA HEAD WITHOUT CONTRAST MRA NECK WITHOUT AND WITH CONTRAST TECHNIQUE: Multiplanar, multi-echo pulse sequences of the brain and surrounding structures were acquired without intravenous contrast. Angiographic images of the Circle of Willis were acquired using MRA technique without intravenous contrast. Angiographic images of the neck were acquired using MRA technique without and with intravenous contrast. Carotid stenosis measurements (when applicable) are  obtained utilizing NASCET criteria, using the distal internal carotid diameter as the denominator. CONTRAST:  45mL GADAVIST GADOBUTROL 1 MMOL/ML IV SOLN COMPARISON:  Prior CT from 09/27/2020. FINDINGS: MRI HEAD FINDINGS Brain: Examination moderately degraded by motion artifact. Cerebral volume within normal limits. Patchy T2/FLAIR hyperintensity seen involving the periventricular deep white matter of both cerebral hemispheres, with patchy involvement of the thalami and pons, most consistent with chronic microvascular ischemic disease, moderate in nature. Few scatter remote lacunar infarcts present at the left thalamus and pons. Multiple scattered foci of restricted diffusion seen involving the bilateral cerebral and cerebellar hemispheres, consistent with acute to early subacute ischemic infarcts. The largest infarct involving the left cerebral hemisphere seen involving the cortical and subcortical posterior left frontal lobe in measures 1.4 cm (series 7, image 51). Largest infarct involving the right cerebral hemisphere involves the right occipital cortex and measures 2 cm (series 5, image 74). This accounts for the hypodensity seen on prior CT. Few small cerebellar infarcts measure up to 9 mm on the right. Patchy infarct measuring 1.2 cm seen involving the central/right paracentral medulla (series 5, image 62). No associated hemorrhage or mass effect. Minimal associated enhancement seen about the dominant right occipital infarct. No mass lesion, midline shift, or significant mass effect. No hydrocephalus or extra-axial fluid collection. Pituitary gland suprasellar region within normal limits. Midline structures intact. No other abnormal enhancement. Vascular: Major intracranial vascular flow voids are maintained. Skull  and upper cervical spine: Craniocervical junction within normal limits. Bone marrow signal intensity normal. No scalp soft tissue abnormality. Sinuses/Orbits: Globes and orbital soft tissues  demonstrate no acute finding. Mild scattered mucosal thickening noted within the ethmoidal air cells. Paranasal sinuses are otherwise clear. Trace bilateral mastoid effusions noted, of doubtful significance. Inner ear structures grossly normal. Other: None. MRA HEAD FINDINGS Anterior circulation: Examination degraded by motion artifact. Visualized distal cervical segments of the internal carotid arteries are patent with antegrade flow. Petrous segments patent bilaterally. Atheromatous irregularity throughout the carotid siphons bilaterally. No significant stenosis on the left. On the right, there is Ellery Tash probable moderate stenosis involving the proximal cavernous right ICA, better seen on corresponding MRA neck portion of this study (series 4, image 30). A1 segments patent bilaterally. Normal anterior communicating artery complex. Anterior cerebral arteries grossly patent to their distal aspects. No visible M1 stenosis or occlusion. Normal MCA bifurcations. Distal MCA branches well perfused and fairly symmetric. Posterior circulation: Left vertebral artery dominant. Irregular moderate stenosis seen involving the distal left V4 segment just prior to the vertebrobasilar junction, better seen on MRA neck portion of this exam (series 2, image 54). Left PICA not visualized. Right vertebral artery diminutive occludes beyond the takeoff of the right PICA. Partially visualized proximal right PICA patent. Basilar patent proximally. Apparent short-segment severe stenosis involving the mid basilar artery on MIP reconstructions is not seen on corresponding time-of-flight sequence, favored to be artifactual. Mild to moderate stenosis noted at the basilar tip (series 2, image 44). Superior cerebellar arteries patent bilaterally. Right PCA primarily supplied via the basilar. Left PCA supplied via the basilar as well as Kayvan Hoefling robust left posterior communicating artery. PCAs perfused to their distal aspects without definite high-grade  stenosis. No visible aneurysm on this motion degraded exam. Anatomic variants: Diminutive right vertebral artery largely terminates in PICA. Prominent left posterior communicating artery. MRA NECK FINDINGS Aortic arch: Examination moderately degraded by motion. Visualized aortic arch normal caliber with normal branch pattern. No hemodynamically significant stenosis seen about the origin of the great vessels. Right carotid system: Right CCA patent from its origin to the bifurcation without stenosis. Atheromatous irregularity seen about the right carotid bulb/proximal right ICA with no more than mild 20% stenosis by NASCET criteria. Right ICA patent distally without stenosis, evidence for dissection, or occlusion. Left carotid system: Left CCA patent from its origin to the bifurcation without stenosis. Atheromatous irregularity with up to 70% short-segment stenosis at the origin of the left ICA (series 1060, image 3). Left ICA patent distally without additional stenosis, evidence for dissection or occlusion. Vertebral arteries: Both vertebral arteries arise from subclavian arteries. Vertebral arteries are patent without hemodynamically significant stenosis. No evidence for dissection or occlusion. Other: None IMPRESSION: MRI HEAD: 1. 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 Given the various vascular distributions involved, Gino Garrabrant central thromboembolic etiology is suspected. 2. Underlying moderate chronic microvascular ischemic disease with Delesha Pohlman few scattered remote lacunar infarcts involving the left thalamus and pons. MRA HEAD: 1. Technically limited exam due to motion artifact. 2. Negative intracranial MRA for large vessel occlusion. Intracranial atherosclerotic disease with associated moderate stenoses involving the distal left V4  segment, distal basilar artery, and cavernous right ICA as above. 3. 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 MRA NECK: 1. Technically limited exam due to  motion artifact. 2. Approximate 70% atheromatous stenosis at the origin of the left ICA. 3. Otherwise negative MRA of the neck. No other hemodynamically significant or correctable stenosis identified. Electronically Signed   By: Jeannine Boga M.D.   On: 09/28/2020 03:18   CARDIAC CATHETERIZATION  Result Date: 09/22/2020  RPDA lesion is 50% stenosed.  Dist Cx lesion is 30% stenosed with 30% stenosed side branch in LPAV.  1st Diag lesion is 60% stenosed.  Mid LAD lesion is 100% stenosed.  Post intervention, there is Quindarius Cabello 0% residual stenosis.  Rasean Joos drug-eluting stent was successfully placed using Jamy Cleckler STENT ONYX FRONTIER [id number].  LV end diastolic pressure is mildly elevated.  1. 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 Mildly elevated LVEDP 21 mmHg 3. Successful PCI of the mid LAD with DES x 1 Plan: DAPT for one year. I suspect she will have significant myocardial injury given late presentation. Will check Echo. Aggressive risk factor modification.   DG Chest Portable 1 View  Result Date: 09/27/2020 CLINICAL DATA:  Code STEMI.  Shortness of breath and recent MI. EXAM: PORTABLE CHEST 1 VIEW COMPARISON:  09/23/2020 FINDINGS: The heart size and mediastinal contours are within normal limits. Both lungs are clear. The visualized skeletal structures are unremarkable. IMPRESSION: No active disease. Electronically Signed   By: Lucienne Capers M.D.   On: 09/27/2020 20:02   DG Swallowing Func-Speech Pathology  Result Date: 10/01/2020 Formatting of this result is different from the original. Objective Swallowing Evaluation: Type of Study: MBS-Modified Barium Swallow Study  Patient Details Name: ANACLARA ACKLIN MRN: [id number] Date of Birth: April 23, 1960 Today's Date: 10/01/2020 Time: SLP Start Time (ACUTE ONLY):  1159 -SLP Stop Time (ACUTE ONLY): 1212 SLP Time Calculation (min) (ACUTE ONLY): 13 min Past Medical History: Past Medical History: Diagnosis Date  Cardiomyopathy, ischemic 09/24/2020  DM (diabetes mellitus), type 2 ([place]) 09/24/2020  HTN (hypertension)   Hyperlipidemia   Prediabetes   S/P angioplasty with stent to mLAD 09/22/20  09/24/2020  Tobacco abuse 09/24/2020  Transaminitis 09/24/2020 Past Surgical History: Past Surgical History: Procedure Laterality Date  BREAST SURGERY Left   age 78  CORONARY/GRAFT ACUTE MI REVASCULARIZATION N/Ruthene Methvin 09/22/2020  Procedure: Coronary/Graft Acute MI Revascularization;  Surgeon: Martinique, Peter M, MD;  Location: [facility];  Service: Cardiovascular;  Laterality: N/Ilona Colley;  LEFT HEART CATH AND CORONARY ANGIOGRAPHY N/Khaliah Barnick 09/22/2020  Procedure: LEFT HEART CATH AND CORONARY ANGIOGRAPHY;  Surgeon: Martinique, Peter M, MD;  Location: [facility];  Service: Cardiovascular;  Laterality: N/Dandria Griego; HPI: Pt is Fallou Hulbert 60 y.o. female who presented with L sided weakness, slurred speech, orthopnea,  and SOB. MRI brain 7/8: Multiple scattered acute to early subacute ischemic nonhemorrhagic infarcts involving the bilateral cerebral and cerebellar hemispheres as above, with largest area of infarction measuring up to 2 cm at the right occipital cortex. TPA was not given. 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 Pt passed the [facility] swallow screen on 7/7 and 7/8 SLP consulted for swallowing on 7/10 due to "some difficulty swallowing and getting SOB when eating" as noted by referring MD. PMH: DM2, tobacco use and trying to quit, HLD, CAD s/p angio and stenting of mLAD 7/4.  Subjective: pt feels like she's coughing with PO intake, getting SOB Assessment / Plan / Recommendation CHL IP CLINICAL IMPRESSIONS 10/01/2020 Clinical Impression Pt presents with functional oropharyngeal swallowing with good timing and efficiency so that she does not have any residuals or penetration/aspiration. She did have  one  moment of strong coughing that occurred shortly after swallowing, and she did acknowledge that this is what has been happening during meals, but there was no clear source for this coughing based on oropharyngeal function. Recommend continuing with regular solids and thin liquids as tolerated. SLP will f/u briefly given subjective symptoms. SLP Visit Diagnosis Dysphagia, unspecified (R13.10) Attention and concentration deficit following -- Frontal lobe and executive function deficit following -- Impact on safety and function No limitations   CHL IP TREATMENT RECOMMENDATION 10/01/2020 Treatment Recommendations Therapy as outlined in treatment plan below   Prognosis 10/01/2020 Prognosis for Safe Diet Advancement Good Barriers to Reach Goals -- Barriers/Prognosis Comment -- CHL IP DIET RECOMMENDATION 10/01/2020 SLP Diet Recommendations Regular solids;Thin liquid Liquid Administration via Cup;Straw Medication Administration Whole meds with liquid Compensations Slow rate;Small sips/bites Postural Changes Seated upright at 90 degrees   CHL IP OTHER RECOMMENDATIONS 10/01/2020 Recommended Consults -- Oral Care Recommendations Oral care BID Other Recommendations --   CHL IP FOLLOW UP RECOMMENDATIONS 10/01/2020 Follow up Recommendations Inpatient Rehab   CHL IP FREQUENCY AND DURATION 10/01/2020 Speech Therapy Frequency (ACUTE ONLY) min 1 x/week Treatment Duration 1 week      CHL IP ORAL PHASE 10/01/2020 Oral Phase WFL Oral - Pudding Teaspoon -- Oral - Pudding Cup -- Oral - Honey Teaspoon -- Oral - Honey Cup -- Oral - Nectar Teaspoon -- Oral - Nectar Cup -- Oral - Nectar Straw -- Oral - Thin Teaspoon -- Oral - Thin Cup -- Oral - Thin Straw -- Oral - Puree -- Oral - Mech Soft -- Oral - Regular -- Oral - Multi-Consistency -- Oral - Pill -- Oral Phase - Comment --  CHL IP PHARYNGEAL PHASE 10/01/2020 Pharyngeal Phase WFL Pharyngeal- Pudding Teaspoon -- Pharyngeal -- Pharyngeal- Pudding Cup -- Pharyngeal -- Pharyngeal- Honey Teaspoon --  Pharyngeal -- Pharyngeal- Honey Cup -- Pharyngeal -- Pharyngeal- Nectar Teaspoon -- Pharyngeal -- Pharyngeal- Nectar Cup -- Pharyngeal -- Pharyngeal- Nectar Straw -- Pharyngeal -- Pharyngeal- Thin Teaspoon -- Pharyngeal -- Pharyngeal- Thin Cup -- Pharyngeal -- Pharyngeal- Thin Straw -- Pharyngeal -- Pharyngeal- Puree -- Pharyngeal -- Pharyngeal- Mechanical Soft -- Pharyngeal -- Pharyngeal- Regular -- Pharyngeal -- Pharyngeal- Multi-consistency -- Pharyngeal -- Pharyngeal- Pill -- Pharyngeal -- Pharyngeal Comment --  CHL IP CERVICAL ESOPHAGEAL PHASE 10/01/2020 Cervical Esophageal Phase WFL Pudding Teaspoon -- Pudding Cup -- Honey Teaspoon -- Honey Cup -- Nectar Teaspoon -- Nectar Cup -- Nectar Straw -- Thin Teaspoon -- Thin Cup -- Thin Straw -- Puree -- Mechanical Soft -- Regular -- Multi-consistency -- Pill -- Cervical Esophageal Comment -- Osie Bond., M.Arwen Haseley. CCC-SLP Acute Rehabilitation Services Pager [phone] Office [phone] 10/01/2020, 1:53 PM              ECHOCARDIOGRAM COMPLETE  Result Date: 09/28/2020    ECHOCARDIOGRAM REPORT   Patient Name:   KARRIN EISENMENGER Date of Exam: 09/28/2020 Medical Rec #:  [id number]          Height:       64.0 in Accession #:    [id number]         Weight:       175.0 lb Date of Birth:  [birth year]          BSA:          1.848 m Patient Age:    63 years           BP:           139/78 mmHg Patient Gender: F  HR:           62 bpm. Exam Location:  Inpatient Procedure: 2D Echo, Cardiac Doppler and Color Doppler Indications:    CHF  History:        Patient has prior history of Echocardiogram examinations, most                 recent 09/22/2020. Risk Factors:Diabetes, Dyslipidemia and Current                 Smoker.  Sonographer:    Cammy Brochure Referring Phys: [place]  1. Left ventricular ejection fraction, by estimation, is 30 to 35%. The left ventricle has moderately decreased function. The left ventricle demonstrates regional wall  motion abnormalities (see scoring diagram/findings for description). There is mild left ventricular hypertrophy. Left ventricular diastolic parameters are consistent with Grade II diastolic dysfunction (pseudonormalization). Elevated left atrial pressure.  2. No LV thrombus seen  3. Right ventricular systolic function is normal. The right ventricular size is normal. Tricuspid regurgitation signal is inadequate for assessing PA pressure.  4. The mitral valve is normal in structure. Trivial mitral valve regurgitation.  5. The aortic valve was not well visualized. Aortic valve regurgitation is not visualized. No aortic stenosis is present.  6. The inferior vena cava is normal in size with greater than 50% respiratory variability, suggesting right atrial pressure of 3 mmHg. FINDINGS  Left Ventricle: Left ventricular ejection fraction, by estimation, is 30 to 35%. The left ventricle has moderately decreased function. The left ventricle demonstrates regional wall motion abnormalities. The left ventricular internal cavity size was normal in size. There is mild left ventricular hypertrophy. Left ventricular diastolic parameters are consistent with Grade II diastolic dysfunction (pseudonormalization). Elevated left atrial pressure.  LV Wall Scoring: The mid and distal anterior wall, mid and distal anterior septum, mid inferoseptal segment, and apex are akinetic. The apical inferior segment is hypokinetic. The entire lateral wall, inferior wall, basal anteroseptal segment, basal anterior segment, and basal inferoseptal segment are normal. Right Ventricle: The right ventricular size is normal. No increase in right ventricular wall thickness. Right ventricular systolic function is normal. Tricuspid regurgitation signal is inadequate for assessing PA pressure. Left Atrium: Left atrial size was normal in size. Right Atrium: Right atrial size was normal in size. Pericardium: There is no evidence of pericardial effusion. Mitral  Valve: The mitral valve is normal in structure. Trivial mitral valve regurgitation. Tricuspid Valve: The tricuspid valve is normal in structure. Tricuspid valve regurgitation is trivial. Aortic Valve: The aortic valve was not well visualized. Aortic valve regurgitation is not visualized. No aortic stenosis is present. Aortic valve mean gradient measures 3.0 mmHg. Aortic valve peak gradient measures 5.7 mmHg. Aortic valve area, by VTI measures 1.73 cm. Pulmonic Valve: The pulmonic valve was not well visualized. Pulmonic valve regurgitation is not visualized. Aorta: The aortic root and ascending aorta are structurally normal, with no evidence of dilitation. Venous: The inferior vena cava is normal in size with greater than 50% respiratory variability, suggesting right atrial pressure of 3 mmHg. IAS/Shunts: The interatrial septum was not well visualized.  LEFT VENTRICLE PLAX 2D LVIDd:         5.10 cm  Diastology LVIDs:         3.80 cm  LV e' medial:    4.46 cm/s LV PW:         1.20 cm  LV E/e' medial:  19.5 LV IVS:  1.10 cm  LV e' lateral:   3.59 cm/s LVOT diam:     1.80 cm  LV E/e' lateral: 24.2 LV SV:         41 LV SV Index:   22 LVOT Area:     2.54 cm  RIGHT VENTRICLE RV Basal diam:  3.40 cm RV S prime:     7.94 cm/s LEFT ATRIUM             Index       RIGHT ATRIUM           Index LA diam:        3.70 cm 2.00 cm/m  RA Area:     10.50 cm LA Vol (A2C):   37.1 ml 20.07 ml/m RA Volume:   24.00 ml  12.98 ml/m LA Vol (A4C):   48.9 ml 26.45 ml/m LA Biplane Vol: 43.4 ml 23.48 ml/m  AORTIC VALVE AV Area (Vmax):    1.65 cm AV Area (Vmean):   1.59 cm AV Area (VTI):     1.73 cm AV Vmax:           119.00 cm/s AV Vmean:          75.400 cm/s AV VTI:            0.235 m AV Peak Grad:      5.7 mmHg AV Mean Grad:      3.0 mmHg LVOT Vmax:         77.00 cm/s LVOT Vmean:        47.200 cm/s LVOT VTI:          0.160 m LVOT/AV VTI ratio: 0.68  AORTA Ao Root diam: 2.60 cm Ao Asc diam:  3.30 cm MITRAL VALVE                TRICUSPID VALVE MV Area (PHT): 4.06 cm    TR Peak grad:   19.7 mmHg MV Decel Time: 187 msec    TR Vmax:        222.00 cm/s MV E velocity: 87.00 cm/s MV Triana Coover velocity: 60.80 cm/s  SHUNTS MV E/Jacky Hartung ratio:  1.43        Systemic VTI:  0.16 m                            Systemic Diam: 1.80 cm Oswaldo Milian MD Electronically signed by Oswaldo Milian MD Signature Date/Time: 09/28/2020/4:34:28 PM    Final    ECHOCARDIOGRAM COMPLETE  Result Date: 09/22/2020    ECHOCARDIOGRAM REPORT   Patient Name:   RENAY CRAMMER Date of Exam: 09/22/2020 Medical Rec #:  [id number]          Height: Accession #:    [id number]         Weight: Date of Birth:  January 29, 1961          BSA: Patient Age:    29 years           BP:           180/99 mmHg Patient Gender: F                  HR:           83 bpm. Exam Location:  Inpatient Procedure: 2D Echo, Cardiac Doppler and Color Doppler Indications:    acute MI  History:        Patient has no prior history of Echocardiogram examinations.  Acute MI, Signs/Symptoms:Chest Pain; Risk Factors:Hypertension,                 Dyslipidemia, Diabetes and Current Smoker.  Sonographer:    Dustin Flock Referring Phys: [id number] PETER M [place] IMPRESSIONS  1. Apex well-visualized without contrast - false tendon (normal variant) - no thrombus. Left ventricular ejection fraction, by estimation, is 35 to 40%. The left ventricle has moderately decreased function. The left ventricle demonstrates regional wall motion abnormalities (see scoring diagram/findings for description). There is mild left ventricular hypertrophy. Left ventricular diastolic parameters are consistent with Grade I diastolic dysfunction (impaired relaxation). Elevated left ventricular end-diastolic pressure. There is severe hypokinesis of the left ventricular, entire anterior wall, anteroseptal wall, apical segment and inferoapical segment. Findings suggest LAD territory ischemia/infarct.  2. Right ventricular systolic function is  normal. The right ventricular size is normal. There is normal pulmonary artery systolic pressure.  3. The mitral valve is abnormal. Mild mitral valve regurgitation.  4. The aortic valve is tricuspid. Aortic valve regurgitation is not visualized.  5. The inferior vena cava is normal in size with <50% respiratory variability, suggesting right atrial pressure of 8 mmHg. Comparison(s): No prior Echocardiogram. FINDINGS  Left Ventricle: Apex well-visualized without contrast - false tendon (normal variant) - no thrombus. Left ventricular ejection fraction, by estimation, is 35 to 40%. The left ventricle has moderately decreased function. The left ventricle demonstrates regional wall motion abnormalities. Severe hypokinesis of the left ventricular, entire anterior wall, anteroseptal wall, apical segment and inferoapical segment. The left ventricular internal cavity size was normal in size. There is mild left ventricular  hypertrophy. Left ventricular diastolic parameters are consistent with Grade I diastolic dysfunction (impaired relaxation). Elevated left ventricular end-diastolic pressure.  LV Wall Scoring: The mid and distal anterior wall, mid and distal anterior septum, and entire apex are hypokinetic. The antero-lateral wall, inferior wall, posterior wall, basal anteroseptal segment, mid inferoseptal segment, basal anterior segment, and basal inferoseptal segment are normal. Right Ventricle: The right ventricular size is normal. No increase in right ventricular wall thickness. Right ventricular systolic function is normal. There is normal pulmonary artery systolic pressure. The tricuspid regurgitant velocity is 2.58 m/s, and  with an assumed right atrial pressure of 8 mmHg, the estimated right ventricular systolic pressure is 29.9 mmHg. Left Atrium: Left atrial size was normal in size. Right Atrium: Right atrial size was normal in size. Pericardium: There is no evidence of pericardial effusion. Mitral Valve: The  mitral valve is abnormal. There is mild thickening of the mitral valve leaflet(s). Mild mitral valve regurgitation. Tricuspid Valve: The tricuspid valve is grossly normal. Tricuspid valve regurgitation is trivial. Aortic Valve: The aortic valve is tricuspid. Aortic valve regurgitation is not visualized. Pulmonic Valve: The pulmonic valve was grossly normal. Pulmonic valve regurgitation is not visualized. Aorta: The aortic root and ascending aorta are structurally normal, with no evidence of dilitation. Venous: The inferior vena cava is normal in size with less than 50% respiratory variability, suggesting right atrial pressure of 8 mmHg. IAS/Shunts: No atrial level shunt detected by color flow Doppler.  LEFT VENTRICLE PLAX 2D LVIDd:         4.90 cm      Diastology LVIDs:         4.40 cm      LV e' medial:    4.13 cm/s LV PW:         1.20 cm      LV E/e' medial:  16.2 LV IVS:  1.00 cm      LV e' lateral:   3.48 cm/s LVOT diam:     2.10 cm      LV E/e' lateral: 19.3 LV SV:         49 LVOT Area:     3.46 cm  LV Volumes (MOD) LV vol d, MOD A4C: 131.0 ml LV vol s, MOD A4C: 75.5 ml LV SV MOD A4C:     131.0 ml RIGHT VENTRICLE RV Basal diam:  2.20 cm RV S prime:     9.57 cm/s TAPSE (M-mode): 1.8 cm LEFT ATRIUM             RIGHT ATRIUM LA diam:        3.80 cm RA Area:     10.10 cm LA Vol (A2C):   28.4 ml RA Volume:   19.60 ml LA Vol (A4C):   34.6 ml LA Biplane Vol: 31.7 ml  AORTIC VALVE LVOT Vmax:   83.50 cm/s LVOT Vmean:  54.400 cm/s LVOT VTI:    0.141 m  AORTA Ao Root diam: 2.70 cm MITRAL VALVE                TRICUSPID VALVE MV Area (PHT): 6.65 cm     TR Peak grad:   26.6 mmHg MV Decel Time: 114 msec     TR Vmax:        258.00 cm/s MV E velocity: 67.00 cm/s MV Tyler Cubit velocity: 100.00 cm/s  SHUNTS MV E/Nashira Mcglynn ratio:  0.67         Systemic VTI:  0.14 m                             Systemic Diam: 2.10 cm Lyman Bishop MD Electronically signed by Lyman Bishop MD Signature Date/Time: 09/22/2020/1:04:05 PM    Final    ECHO  TEE  Result Date: 10/02/2020    TRANSESOPHOGEAL ECHO REPORT   Patient Name:   ELEANOR GATLIFF Crew Date of Exam: 10/02/2020 Medical Rec #:  [id number]          Height:       64.0 in Accession #:    [id number]         Weight:       154.5 lb Date of Birth:  05-28-1960          BSA:          1.753 m Patient Age:    68 years           BP:           147/70 mmHg Patient Gender: F                  HR:           75 bpm. Exam Location:  Inpatient Procedure: Transesophageal Echo, 3D Echo, Cardiac Doppler, Color Doppler and            Strain Analysis Indications:    Cerebral Infarction, unspecified I63.9  History:        Patient has prior history of Echocardiogram examinations, most                 recent 09/28/2020. CHF; Risk Factors:Diabetes, Dyslipidemia and                 Current Smoker.  Sonographer:    Darlina Sicilian RDCS Referring Phys: [place]: After discussion of the risks and benefits  of Chrysta Fulcher TEE, an informed consent was obtained from the patient. TEE procedure time was 11 minutes. The transesophogeal probe was passed without difficulty through the esophogus of the patient. Imaged were obtained with the patient in Amilliana Hayworth left lateral decubitus position. Sedation performed by different physician. The patient was monitored while under deep sedation. Image quality was good. The patient's vital signs; including heart rate, blood pressure, and oxygen saturation; remained stable throughout the procedure. The patient developed no complications during the procedure. IMPRESSIONS  1. Akinesis of the anteroseptal wall and apex with overall moderate to severe LV dysfunction; no obvious apical thrombus noted using definity.  2. Left ventricular ejection fraction, by estimation, is 30 to 35%. The left ventricle has moderate to severely decreased function. The left ventricle demonstrates regional wall motion abnormalities (see scoring diagram/findings for description). The left ventricular internal cavity size was  mildly dilated.  3. Right ventricular systolic function is normal. The right ventricular size is normal.  4. Left atrial size was mildly dilated. No left atrial/left atrial appendage thrombus was detected.  5. Selmer Adduci small pericardial effusion is present.  6. The mitral valve is normal in structure. Mild mitral valve regurgitation.  7. The aortic valve is tricuspid. Aortic valve regurgitation is not visualized.  8. There is mild (Grade II) plaque involving the descending aorta.  9. Agitated saline contrast bubble study was positive with shunting observed after >6 cardiac cycles suggestive of intrapulmonary shunting. FINDINGS  Left Ventricle: Left ventricular ejection fraction, by estimation, is 30 to 35%. The left ventricle has moderate to severely decreased function. The left ventricle demonstrates regional wall motion abnormalities. The left ventricular internal cavity size was mildly dilated. Right Ventricle: The right ventricular size is normal. Right ventricular systolic function is normal. Left Atrium: Left atrial size was mildly dilated. No left atrial/left atrial appendage thrombus was detected. Right Atrium: Right atrial size was normal in size. Pericardium: Wylma Tatem small pericardial effusion is present. Mitral Valve: The mitral valve is normal in structure. Mild mitral valve regurgitation. Tricuspid Valve: The tricuspid valve is normal in structure. Tricuspid valve regurgitation is trivial. Aortic Valve: The aortic valve is tricuspid. Aortic valve regurgitation is not visualized. Pulmonic Valve: The pulmonic valve was normal in structure. Pulmonic valve regurgitation is not visualized. Aorta: The aortic root is normal in size and structure. There is mild (Grade II) plaque involving the descending aorta. IAS/Shunts: No atrial level shunt detected by color flow Doppler. Agitated saline contrast bubble study was positive with shunting observed after >6 cardiac cycles suggestive of intrapulmonary shunting. Additional  Comments: Akinesis of the anteroseptal wall and apex with overall moderate to severe LV dysfunction; no obvious apical thrombus noted using definity.   3D Volume EF LV 3D EDV:   115.45 ml LV 3D ESV:   76.35 ml  AORTA Ao Root diam: 2.50 cm Ao Asc diam:  2.70 cm MITRAL VALVE MV Area (plan): 5.03 cm Kirk Ruths MD Electronically signed by Kirk Ruths MD Signature Date/Time: 10/02/2020/10:16:20 AM    Final    ABORTED INVASIVE LAB PROCEDURE  Result Date: 09/28/2020 This case was aborted.   Microbiology: No results found for this or any previous visit (from the past 240 hour(s)).   Labs: Basic Metabolic Panel: Recent Labs  Lab 10/04/20 0058 10/05/20 0207 10/06/20 0219 10/07/20 0119 10/08/20 0124  NA 135 134* 134* 135 136  K 4.3 4.1 4.2 4.1 4.0  CL 104 106 106 106 106  CO2 23 21* 21* 24 22  GLUCOSE 146*  138* 143* 142* 142*  BUN 18 17 16 16 16   CREATININE 1.13* 1.06* 1.09* 1.20* 1.04*  CALCIUM 9.3 9.6 9.4 9.7 9.6  MG 2.0 2.1 2.0 2.0 2.0  PHOS 3.2 3.6 3.6 4.4 3.8   Liver Function Tests: Recent Labs  Lab 10/04/20 0058 10/05/20 0207 10/06/20 0219 10/07/20 0119 10/08/20 0124  AST 14* 16 18 18 16   ALT 14 17 19 18 19   ALKPHOS 55 54 50 47 47  BILITOT 0.6 0.5 0.2* 0.5 0.4  PROT 6.5 6.7 6.5 6.4* 6.6  ALBUMIN 2.6* 2.7* 2.6* 2.6* 2.6*   No results for input(s): LIPASE, AMYLASE in the last 168 hours. No results for input(s): AMMONIA in the last 168 hours. CBC: Recent Labs  Lab 10/04/20 0058 10/05/20 0207 10/06/20 0219 10/07/20 0119 10/08/20 0124  WBC 8.4 7.7 7.9 7.7 6.6  NEUTROABS 6.2 5.0 4.9 4.3 3.5  HGB 11.3* 11.5* 10.8* 10.9* 10.9*  HCT 34.4* 35.1* 32.3* 33.2* 33.4*  MCV 94.0 93.1 92.8 94.1 94.6  PLT 393 418* 390 416* 390   Cardiac Enzymes: No results for input(s): CKTOTAL, CKMB, CKMBINDEX, TROPONINI in the last 168 hours. BNP: BNP (last 3 results) Recent Labs    09/27/20 1916  BNP 724.0*    ProBNP (last 3 results) No results for input(s): PROBNP in the  last 8760 hours.  CBG: Recent Labs  Lab 10/08/20 0735 10/08/20 1123 10/08/20 1544 10/08/20 2145 10/09/20 0807  GLUCAP 144* 137* 139* 146* 144*       Signed:  Fayrene Helper MD.  [facility] 10/09/2020, 11:26 AM

## 2020-10-09 NOTE — Progress Notes (Signed)
Inpatient Rehabilitation Medication Review by a Pharmacist  A complete drug regimen review was completed for this patient to identify any potential clinically significant medication issues.  Clinically significant medication issues were identified:  no  Check AMION for pharmacist assigned to patient if future medication questions/issues arise during this admission.  Pharmacist comments:   Time spent performing this drug regimen review (minutes):  15   Shady Padron 10/09/2020 6:45 PM
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
## 2020-10-09 NOTE — Progress Notes (Signed)
CSW was notified by Mickel Baas at [facility] that the patient was accepted and a bed is available today.  CSW attempted to reach patient's significant other without success - a text message was sent requesting a return call.  CSW attempted to reach patient's sister [name] without success - a voicemail was left requesting a return call.  Madilyn Fireman, MSW, LCSW Transitions of Care  Clinical Social Worker II [phone]

## 2020-10-09 NOTE — Progress Notes (Signed)
Inpatient Rehab Admissions Coordinator:   I have a CIR bed for this pt. And will transfer today. Rn may call report to [phone].  Dat Derksen

## 2020-10-09 NOTE — Progress Notes (Signed)
PMR Admission Coordinator Pre-Admission Assessment   Patient: Tracey Meyer is an 60 y.o., female MRN: [id number] DOB: 12/15/1960 Height: _0  (162.6 cm) Weight: 69.5 kg   Insurance Information     PRIMARY: Uninsured       SECONDARY:       Policy#:      Phone#:   Development worker, community:       Phone#:   The Engineer, [employer]" for patients in Inpatient Rehabilitation Facilities with attached "Privacy Act [place] Records" was provided and verbally reviewed with: Patient   Emergency Contact Information Contact Information       Name Relation Home Work Mobile    Meyer,Tracey Significant other     [phone]    Tracey Meyer     [phone]    Radene Journey Sister [phone]           Current Medical History  Patient Admitting Diagnosis CVA History of Present Illness:Tracey Meyer is a 60 y/o F with CAD (recent stent to the LAD on 7/4), ischemic cardiomyopathy with EF of 35-40%, HTN, DM2, HLD and smoking who presented to the ED with shortness of breath and orthopnea. She also stated that on the evening of 7/4, she developed left sided weakness and slurred speech which did not improve. She presented to the ED 7/7 and  CT showed 18 x 16 x 17 mm focus of peripheral hypo attenuation in the posterior right parietooccipital region. MRI showed multiple  scattered acute to early subacute ischemic nonhemorrhagic infarcts involving the bilateral cerebral and cerebellar hemispheres as above, with largest area of infarction measuring up to 2 cm at the right occipital cortex. MRA head technically limited due to motion artifact, negative for LVO.  Intracranial atherosclerotic disease with associated moderate stneoses involving the distal L V4 segment, distal basilar artery, and carvernous R ICA.  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 MRA neck limited due to motion artifact, 70% stenosis at origin of L ICA.   Otherwise negative MRA of neck.CIR was consulted to assist in return to PLOF.    Complete NIHSS TOTAL: 8   Patient's medical record from [facility] has been reviewed by the rehabilitation admission coordinator and physician.   Past Medical History      Past Medical History:  Diagnosis Date   Cardiomyopathy, ischemic 09/24/2020   DM (diabetes mellitus), type 2 ([place]) 09/24/2020   HTN (hypertension)     Hyperlipidemia     Prediabetes     S/P angioplasty with stent to mLAD 09/22/20 09/24/2020   Tobacco abuse 09/24/2020   Transaminitis 09/24/2020      Family History   family history includes Cancer in her mother; Heart disease in her brother.   Prior Rehab/Hospitalizations Has the patient had prior rehab or hospitalizations prior to admission? Yes   Has the patient had major surgery during 100 days prior to admission? Yes              Current Medications   Current Facility-Administered Medications:    stroke: mapping our early stages of recovery book, , Does not apply, Once, Alcario Drought, Jared M, DO   acetaminophen (TYLENOL) tablet 650 mg, 650 mg, Oral, Q4H PRN **OR** acetaminophen (TYLENOL) 160 MG/5ML solution 650 mg, 650 mg, Per Tube, Q4H PRN **OR** acetaminophen (TYLENOL) suppository 650 mg, 650 mg, Rectal, Q4H PRN, Etta Quill, DO   alum & mag hydroxide-simeth (MAALOX/MYLANTA) 200-200-20 MG/5ML suspension 30 mL, 30  mL, Oral, Q6H PRN, Debbe Odea, MD, 30 mL at 09/29/20 1848   aspirin EC tablet 81 mg, 81 mg, Oral, Daily, Etta Quill, DO, 81 mg at 09/29/20 0903   enoxaparin (LOVENOX) injection 40 mg, 40 mg, Subcutaneous, Q24H, Alcario Drought, Jared M, DO, 40 mg at 09/29/20 8882   insulin aspart (novoLOG) injection 0-5 Units, 0-5 Units, Subcutaneous, QHS, Alcario Drought, Jared M, DO   insulin aspart (novoLOG) injection 0-9 Units, 0-9 Units, Subcutaneous, TID WC, Alcario Drought, Jared M, DO, 1 Units at 09/29/20 1827   rosuvastatin (CRESTOR) tablet 40 mg, 40 mg, Oral, Daily, Renae Fickle,  MD, 40 mg at 09/29/20 8003   ticagrelor (BRILINTA) tablet 90 mg, 90 mg, Oral, BID, Etta Quill, DO, 90 mg at 09/29/20 2128   Patients Current Diet:  Diet Order                  Diet heart healthy/carb modified Room service appropriate? No; Fluid consistency: Thin  Diet effective now                         Precautions / Restrictions Precautions Precautions: Fall Precaution Comments: L hemiplegia Restrictions Weight Bearing Restrictions: No    Has the patient had 2 or more falls or a fall with injury in the past year? Yes   Prior Activity Level   Prior Functional Level Self Care: Did the patient need help bathing, dressing, using the toilet or eating? Independent   Indoor Mobility: Did the patient need assistance with walking from room to room (with or without device)? Independent   Stairs: Did the patient need assistance with internal or external stairs (with or without device)? Independent   Functional Cognition: Did the patient need help planning regular tasks such as shopping or remembering to take medications? [place] / [place] Devices/Equipment: Cane (specify quad or straight) (straight cane) Home Equipment: Cane - single point   Prior Device Use: Indicate devices/aids used by the patient prior to current illness, exacerbation or injury? None of the above   Current Functional Level Cognition   Overall Cognitive Status: Impaired/Different from baseline Orientation Level: Oriented X4 Following Commands: Follows one step commands with increased time Safety/Judgement: Decreased awareness of safety, Decreased awareness of deficits General Comments: Pt with difficulty following commands during visual assessment, requires repeated VC for directions to perform correctly. Step-by-step sequencing cues for mobility tasks    Extremity Assessment (includes Sensation/Coordination)   Upper Extremity Assessment: LUE  deficits/detail LUE Deficits / Details: flaccid LUE Sensation: WNL LUE Coordination: decreased fine motor, decreased gross motor  Lower Extremity Assessment: Defer to PT evaluation LLE Deficits / Details: 1/5 knee flexors; all other motions 0/5 LLE Sensation: WNL     ADLs   Overall ADL's : Needs assistance/impaired Grooming: Wash/dry hands, Moderate assistance, Sitting Upper Body Bathing: Bed level, Moderate assistance Lower Body Bathing: Maximal assistance, Bed level Upper Body Dressing : Moderate assistance, Sitting Lower Body Dressing: Moderate assistance, Bed level Toilet Transfer: +2 for physical assistance, Moderate assistance Toileting- Clothing Manipulation and Hygiene: Sitting/lateral lean, Minimal assistance Toileting - Clothing Manipulation Details (indicate cue type and reason): thoroughness Functional mobility during ADLs: Moderate assistance, +2 for physical assistance     Mobility   Overal bed mobility: Needs Assistance Bed Mobility: Supine to Sit, Sit to Supine Supine to sit: Min assist, +2 for physical assistance Sit to supine: Min assist, +2 for physical assistance General bed mobility comments: min +2  for LLE/LUE management, trunk elevation, cues for RUE on L bedrail to aide in roll and trunk elevation.     Transfers   Overall transfer level: Needs assistance Equipment used: 2 person hand held assist Transfers: Sit to/from Stand, Stand Pivot Transfers Sit to Stand: Mod assist, +2 physical assistance Stand pivot transfers: Mod assist, +2 physical assistance  Lateral/Scoot Transfers: Mod assist, +2 physical assistance General transfer comment: Mod +2 for power up, rise, steady, correct posture to upright as pt with heavy R lateral leaning, pivot towards R to BSC and recliner, respectively. Pt with difficulty progressing LLE and + buckling, requiring max LE assist. STS x2, stand pivot x2.     Ambulation / Gait / Stairs / Wheelchair Mobility    Ambulation/Gait General Gait Details: NT on eval     Posture / Balance Dynamic Sitting Balance Sitting balance - Comments: EOB sitting statically at supervision level Balance Overall balance assessment: Needs assistance Sitting-balance support: Feet supported Sitting balance-Leahy Scale: Fair Sitting balance - Comments: EOB sitting statically at supervision level Standing balance support: Bilateral upper extremity supported, During functional activity Standing balance-Leahy Scale: Poor Standing balance comment: reliant on PT/OT assist     Special needs/care consideration none    Previous Home Environment (from acute therapy documentation) Living Arrangements: Spouse/significant other  Lives With: Spouse Available Help at Discharge: Family, Available 24 hours/day Type of Home: Mobile home Home Layout: One level Home Access: Stairs to enter Technical brewer of Steps: 3 Bathroom Shower/Tub: Chiropodist: Standard Bathroom Accessibility: Yes How Accessible: Accessible via walker Marlette: No   Discharge Living Setting Plans for Discharge Living Setting: Patient's home Type of Home at Discharge: Mobile home Discharge Home Layout: One level Discharge Home Access: Stairs to enter Entrance Stairs-Rails: None Discharge Bathroom Shower/Tub: Tub/shower unit Discharge Bathroom Toilet: Standard Discharge Bathroom Accessibility: Yes How Accessible: Accessible via walker Does the patient have any problems obtaining your medications?: No   Social/Family/Support Systems Patient Roles: Partner Contact Information: [phone] Anticipated Caregiver: [name] Anticipated Caregiver's Contact Information: [phone] Caregiver Availability: 24/7 Discharge Plan Discussed with Primary Caregiver: Yes Is Caregiver In Agreement with Plan?: Yes   Goals Patient/Family Goal for Rehab: PT/OT/SLP min A Expected length of stay: 19-21 days Pt/Family Agrees  to Admission and willing to participate: Yes Program Orientation Provided & Reviewed with Pt/Caregiver Including Roles  & Responsibilities: Yes   Decrease burden of Care through IP rehab admission: Specialzed equipment needs, Decrease number of caregivers, Bowel and bladder program, and Patient/family education   Possible need for SNF placement upon discharge: not anticipated           Patient Condition: I have reviewed medical records from [facility], spoken with CM, and patient. I met with patient at the bedside for inpatient rehabilitation assessment.  Patient will benefit from ongoing PT and OT, can actively participate in 3 hours of therapy a day 5 days of the week, and can make measurable gains during the admission.  Patient will also benefit from the coordinated team approach during an Inpatient Acute Rehabilitation admission.  The patient will receive intensive therapy as well as Rehabilitation physician, nursing, social worker, and care management interventions.  Due to safety, skin/wound care, medication administration, pain management, and patient education the patient requires 24 hour a day rehabilitation nursing.  The patient is currently min-mod A with mobility and basic ADLs.  Discharge setting and therapy post discharge at home with home health is anticipated.  Patient has  agreed to participate in the Acute Inpatient Rehabilitation Program and will admit today.   Preadmission Screen Completed By:  Genella Mech, 09/30/2020 10:39 AM ______________________________________________________________________   Discussed status with Dr. Dagoberto Ligas  on 10/09/20 at 42 and received approval for admission today.   Admission Coordinator:  Genella Mech, CCC-SLP, time 175 /Date 10/09/20    Assessment/Plan: Diagnosis:multiple bi-cerebral infarcts, largest in right parietal occipital cortex Does the need for close, 24 hr/day Medical supervision in concert with the patient's rehab needs  make it unreasonable for this patient to be served in a less intensive setting? Yes Co-Morbidities requiring supervision/potential complications: CAD, CM, HTN, DM Due to bladder management, bowel management, safety, skin/wound care, disease management, medication administration, pain management, and patient education, does the patient require 24 hr/day rehab nursing? Yes Does the patient require coordinated care of a physician, rehab nurse, PT, OT, and SLP to address physical and functional deficits in the context of the above medical diagnosis(es)? Yes Addressing deficits in the following areas: balance, endurance, locomotion, strength, transferring, bowel/bladder control, bathing, dressing, feeding, grooming, toileting, cognition, speech, swallowing, and psychosocial support Can the patient actively participate in an intensive therapy program of at least 3 hrs of therapy 5 days a week? Yes The potential for patient to make measurable gains while on inpatient rehab is excellent Anticipated functional outcomes upon discharge from inpatient rehab: min assist PT, min assist OT, min assist SLP Estimated rehab length of stay to reach the above functional goals is: 19-21 days Anticipated discharge destination: Home 10. Overall Rehab/Functional Prognosis: excellent     MD Signature: Meredith Staggers, MD, [place]
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
## 2020-10-10 DIAGNOSIS — I63111 Cerebral infarction due to embolism of right vertebral artery: Secondary | ICD-10-CM

## 2020-10-10 LAB — CBC WITH DIFFERENTIAL/PLATELET
Abs Immature Granulocytes: 0.02 10*3/uL (ref 0.00–0.07)
Basophils Absolute: 0 10*3/uL (ref 0.0–0.1)
Basophils Relative: 1 %
Eosinophils Absolute: 0.4 10*3/uL (ref 0.0–0.5)
Eosinophils Relative: 6 %
HCT: 34.6 % — ABNORMAL LOW (ref 36.0–46.0)
Hemoglobin: 11.2 g/dL — ABNORMAL LOW (ref 12.0–15.0)
Immature Granulocytes: 0 %
Lymphocytes Relative: 29 %
Lymphs Abs: 2 10*3/uL (ref 0.7–4.0)
MCH: 30.3 pg (ref 26.0–34.0)
MCHC: 32.4 g/dL (ref 30.0–36.0)
MCV: 93.5 fL (ref 80.0–100.0)
Monocytes Absolute: 0.6 10*3/uL (ref 0.1–1.0)
Monocytes Relative: 8 %
Neutro Abs: 3.8 10*3/uL (ref 1.7–7.7)
Neutrophils Relative %: 56 %
Platelets: 412 10*3/uL — ABNORMAL HIGH (ref 150–400)
RBC: 3.7 MIL/uL — ABNORMAL LOW (ref 3.87–5.11)
RDW: 12.7 % (ref 11.5–15.5)
WBC: 6.8 10*3/uL (ref 4.0–10.5)
nRBC: 0 % (ref 0.0–0.2)

## 2020-10-10 LAB — COMPREHENSIVE METABOLIC PANEL
ALT: 21 U/L (ref 0–44)
AST: 19 U/L (ref 15–41)
Albumin: 2.7 g/dL — ABNORMAL LOW (ref 3.5–5.0)
Alkaline Phosphatase: 55 U/L (ref 38–126)
Anion gap: 5 (ref 5–15)
BUN: 15 mg/dL (ref 6–20)
CO2: 23 mmol/L (ref 22–32)
Calcium: 9.3 mg/dL (ref 8.9–10.3)
Chloride: 108 mmol/L (ref 98–111)
Creatinine, Ser: 1.17 mg/dL — ABNORMAL HIGH (ref 0.44–1.00)
GFR, Estimated: 54 mL/min — ABNORMAL LOW (ref >60)
Glucose, Bld: 137 mg/dL — ABNORMAL HIGH (ref 70–99)
Potassium: 4.1 mmol/L (ref 3.5–5.1)
Sodium: 136 mmol/L (ref 135–145)
Total Bilirubin: 0.3 mg/dL (ref 0.3–1.2)
Total Protein: 6.7 g/dL (ref 6.5–8.1)

## 2020-10-10 LAB — GLUCOSE, CAPILLARY
Glucose-Capillary: 119 mg/dL — ABNORMAL HIGH (ref 70–99)
Glucose-Capillary: 145 mg/dL — ABNORMAL HIGH (ref 70–99)
Glucose-Capillary: 148 mg/dL — ABNORMAL HIGH (ref 70–99)
Glucose-Capillary: 114 mg/dL — ABNORMAL HIGH (ref 70–99)

## 2020-10-10 NOTE — Evaluation (Signed)
Physical Therapy Assessment and Plan  Patient Details  Name: Tracey Meyer MRN: [id number] Date of Birth: May 16, 1960  PT Diagnosis: Abnormal posture, Abnormality of gait, Difficulty walking, Hemiparesis non-dominant, Impaired cognition, Impaired sensation, and Muscle weakness Rehab Potential: Excellent ELOS: 14-16 days   Today's Date: 10/10/2020 PT Individual Time: 1100-1200 PT Individual Time Calculation (min): 60 min    Hospital Problem: Principal Problem:   Embolic stroke [facility])   Past Medical History:  Past Medical History:  Diagnosis Date   Cardiomyopathy, ischemic 09/24/2020   DM (diabetes mellitus), type 2 ([place]) 09/24/2020   HTN (hypertension)    Hyperlipidemia    Prediabetes    S/P angioplasty with stent to mLAD 09/22/20  09/24/2020   Tobacco abuse 09/24/2020   Transaminitis 09/24/2020   Past Surgical History:  Past Surgical History:  Procedure Laterality Date   BUBBLE STUDY  10/02/2020   Procedure: BUBBLE STUDY;  Surgeon: Lelon Perla, MD;  Location: [facility] ENDOSCOPY;  Service: Cardiovascular;;   CORONARY/GRAFT ACUTE MI REVASCULARIZATION N/A 09/22/2020   Procedure: Coronary/Graft Acute MI Revascularization;  Surgeon: Martinique, Peter M, MD;  Location: [place] CV LAB;  Service: Cardiovascular;  Laterality: N/A;   LEFT HEART CATH AND CORONARY ANGIOGRAPHY N/A 09/22/2020   Procedure: LEFT HEART CATH AND CORONARY ANGIOGRAPHY;  Surgeon: Martinique, Peter M, MD;  Location: [place] CV LAB;  Service: Cardiovascular;  Laterality: N/A;   mastectomy Left    at age 60 due to concerns of cancer   TEE WITHOUT CARDIOVERSION N/A 10/02/2020   Procedure: TRANSESOPHAGEAL ECHOCARDIOGRAM (TEE);  Surgeon: Lelon Perla, MD;  Location: [facility] ENDOSCOPY;  Service: Cardiovascular;  Laterality: N/A;    Assessment & Plan Clinical Impression:  Tomorrow D. Bucio is a 60 year old female with history of HTN, dyslipidemia, pre-diabetes but lack of medical care for years who was evaluated  at [facility] on 09/22/20 for upper abdominal pain, N/V, diarrhea and diaphoresis. She was noted to have malignant HTN with BP 215/110 and EKG showed NSTEMI therefore she was transferred to [facility] for emergent cardiac cath. This revealed 100% occlusion of mild LAD which was treated with PCI of mid LAD with DES X 1 by Dr. Ellyn Hack.  She was discharged to home on 07/04 but readmitted on 07/07 with reports of left sided weakness and slurred speech that began evening of discharge and persisted.  She also had issues with increasing SOB and orthopnea with decreasing urine output and presented to the ED for work-up.  MRI brain done showing multiple scattered acute to early subacute infarcts involving bilateral cerebral and cerebellar hemisphere with largest area of infarct in right occipital cortex and felt to be central thromboembolic etiology.  MRA showed moderate stenosis of distal left V4 segment, distal basilar artery and cavernous right ICA and occlusion of right V4 segment beyond takeoff of right PICA and 70% plaque L-ICA changes.       Dr. Carlis Abbott consulted for input and felt that left ICA not because of a stroke and to follow-up in 3 months for repeat carotid duplex. TEE for work-up and showed moderate severe LV dysfunction and no obvious apical thrombus, small pericardial effusion and EF of 30 to 35%.  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 fib after discharge.  She continues on DAPT due to DES and medications adjusted to manage chronic combined CHF with SOB.  Dr. Erlinda Hong recommends long-term BP goal 130-150 given significant posterior circulation vascular stenosis.  She continues to be limited by left hemiplegia affecting ADLs and mobility. CIR recommended due to functional decline. Patient transferred to CIR on 10/09/2020 .   Patient currently requires mod with mobility secondary to muscle weakness, decreased cardiorespiratoy endurance, abnormal tone, unbalanced muscle  activation, and decreased coordination, decreased awareness, decreased problem solving, decreased safety awareness, and decreased memory, and decreased sitting balance, decreased standing balance, decreased postural control, hemiplegia, and decreased balance strategies.  Prior to hospitalization, patient was independent  with mobility and lived with Significant other in a Mobile home home.  Home access is 3Stairs to enter.  Patient will benefit from skilled PT intervention to maximize safe functional mobility, minimize fall risk, and decrease caregiver burden for planned discharge home with 24 hour supervision.  Anticipate patient will benefit from follow up [place] at discharge.  PT - End of Session Activity Tolerance: Tolerates 30+ min activity with multiple rests Endurance Deficit: Yes Endurance Deficit Description: fatigues quickly with functional activity PT Assessment Rehab Potential (ACUTE/IP ONLY): Excellent PT Barriers to Discharge: Home environment access/layout PT Patient demonstrates impairments in the following area(s): Balance;Endurance;Motor;Safety;Sensory PT Transfers Functional Problem(s): Bed Mobility;Bed to Chair;Car;Furniture;Floor PT Locomotion Functional Problem(s): Ambulation;Wheelchair Mobility;Stairs PT Plan PT Intensity: Minimum of 1-2 x/day ,45 to 90 minutes PT Frequency: 5 out of 7 days PT Duration Estimated Length of Stay: 14-16 days PT Treatment/Interventions: Ambulation/gait training;Balance/vestibular training;Cognitive remediation/compensation;Community reintegration;Discharge planning;Disease management/prevention;DME/adaptive equipment instruction;Functional electrical stimulation;Functional mobility training;Neuromuscular re-education;Pain management;Patient/family education;Psychosocial support;Splinting/orthotics;Stair training;Therapeutic Activities;Therapeutic Exercise;UE/LE Strength taining/ROM;UE/LE Coordination activities;Wheelchair propulsion/positioning PT  Transfers Anticipated Outcome(s): Supervision PT Locomotion Anticipated Outcome(s): Supervision with LRAD PT Recommendation Recommendations for Other Services: Therapeutic Recreation consult Therapeutic Recreation Interventions: Stress management Follow Up Recommendations: Home health PT;24 hour supervision/assistance Patient destination: Home Equipment Recommended: To be determined Equipment Details: TBD pending progress   PT Evaluation Precautions/Restrictions Precautions Precautions: Fall Precaution Comments: L hemiparesis Restrictions Weight Bearing Restrictions: No Home Living/Prior Functioning Home Living Living Arrangements: Spouse/significant other Available Help at Discharge: Family;Available 24 hours/day Type of Home: Mobile home Home Access: Stairs to enter Entrance Stairs-Number of Steps: 3 Entrance Stairs-Rails: None Home Layout: One level Additional Comments: unsure if significant other can provide physical assist  Lives With: Significant other Prior Function Level of Independence: Independent with gait;Independent with transfers  Able to Take Stairs?: Yes Driving: No Vocation: Part time employment 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 Vision/Perception  Vision - History Baseline Vision: Other (comment) (reports blurry vision in R eye for years) Patient Visual Report: No change from baseline Perception Perception: Within Functional Limits Praxis Praxis: Intact  Cognition Overall Cognitive Status: Within Functional Limits for tasks assessed Arousal/Alertness: Awake/alert Orientation Level: Oriented X4 Attention: Focused;Sustained Focused Attention: Appears intact Sustained Attention: Impaired Memory: Impaired Memory Impairment: Retrieval deficit;Decreased recall of new information Awareness: Impaired Awareness Impairment: Emergent impairment Problem Solving: Impaired Behaviors: Impulsive Safety/Judgment: Impaired Comments: slightly  impulsive Sensation Sensation Light Touch: Impaired Detail Light Touch Impaired Details: Impaired LUE;Impaired LLE Proprioception: Impaired Detail Proprioception Impaired Details: Impaired LUE;Impaired LLE Coordination Gross Motor Movements are Fluid and Coordinated: No Fine Motor Movements are Fluid and Coordinated: No Coordination and Movement Description: impaired 2/2 L hemi Motor  Motor Motor: Hemiplegia;Abnormal tone;Abnormal postural alignment and control Motor - Skilled Clinical Observations: L hemi  Trunk/Postural Assessment  Cervical Assessment Cervical Assessment: Exceptions to [facility] (forward head) Thoracic Assessment Thoracic Assessment: Exceptions to [facility] (rounded shoulders) Lumbar Assessment Lumbar Assessment: Exceptions to [facility] (posterior pelvic tilt) Postural Control Postural Control: Deficits on evaluation Trunk Control: decreased control Righting Reactions: delayed  Balance  Balance Balance Assessed: Yes Static Sitting Balance Static Sitting - Balance Support: No upper extremity supported;Feet supported Static Sitting - Level of Assistance: 5: Stand by assistance Dynamic Sitting Balance Dynamic Sitting - Balance Support: No upper extremity supported;Feet supported;During functional activity Dynamic Sitting - Level of Assistance: 4: Min assist Static Standing Balance Static Standing - Balance Support: Bilateral upper extremity supported;During functional activity Static Standing - Level of Assistance: 4: Min assist Dynamic Standing Balance Dynamic Standing - Balance Support: Right upper extremity supported;During functional activity Dynamic Standing - Level of Assistance: 3: Mod assist Extremity Assessment   RLE Assessment RLE Assessment: Within Functional Limits General Strength Comments: see below RLE Strength Right Hip Flexion: 4+/5 Right Knee Flexion: 5/5 Right Knee Extension: 5/5 Right Ankle Dorsiflexion: 5/5 LLE Assessment LLE Assessment: Exceptions  to [facility] General Strength Comments: impaired, see below LLE Strength Left Hip Flexion: 2-/5 Left Knee Flexion: 1/5 Left Knee Extension: 2/5 Left Ankle Dorsiflexion: 1/5  Care Tool Care Tool Bed Mobility Roll left and right activity   Roll left and right assist level: Minimal Assistance - Patient > 75%    Sit to lying activity   Sit to lying assist level: Minimal Assistance - Patient > 75%    Lying to sitting edge of bed activity   Lying to sitting edge of bed assist level: Minimal Assistance - Patient > 75%     Care Tool Transfers Sit to stand transfer   Sit to stand assist level: Moderate Assistance - Patient 50 - 74%    Chair/bed transfer   Chair/bed transfer assist level: Minimal Assistance - Patient > 75% (squat pivot)     Psychologist, counselling transfer activity did not occur: Safety/medical concerns        Care Tool Locomotion Ambulation   Assist level: 2 helpers Assistive device: Other (comment) (R handrail in hallway) Max distance: 30'  Walk 10 feet activity   Assist level: 2 helpers Assistive device: Other (comment) (rail in hallway)   Walk 50 feet with 2 turns activity Walk 50 feet with 2 turns activity did not occur: Safety/medical concerns      Walk 150 feet activity Walk 150 feet activity did not occur: Safety/medical concerns      Walk 10 feet on uneven surfaces activity Walk 10 feet on uneven surfaces activity did not occur: Safety/medical concerns      Stairs Stair activity did not occur: Safety/medical concerns        Walk up/down 1 step activity Walk up/down 1 step or curb (drop down) activity did not occur: Safety/medical concerns     Walk up/down 4 steps activity did not occuR: Safety/medical concerns  Walk up/down 4 steps activity      Walk up/down 12 steps activity Walk up/down 12 steps activity did not occur: Safety/medical concerns      Pick up small objects from floor Pick up small object from the floor (from  standing position) activity did not occur: Safety/medical concerns      Wheelchair Will patient use wheelchair at discharge?:  (TBD)     Wheelchair assist level: Dependent - Patient 0% Max wheelchair distance: 150'  Wheel 50 feet with 2 turns activity   Assist Level: Dependent - Patient 0%  Wheel 150 feet activity   Assist Level: Dependent - Patient 0%    Refer to Care Plan for Long Term Goals  SHORT TERM GOAL WEEK 1 PT Short Term Goal 1 (Week 1):  Pt will complete least restrictive transfer with min A consistently PT Short Term Goal 2 (Week 1): Pt will ambulate with assist x 1 consistently PT Short Term Goal 3 (Week 1): Pt will initiate stair training  Recommendations for other services: Therapeutic Recreation  Stress management  Skilled Therapeutic Intervention Evaluation completed (see details above and below) with education on PT POC and goals and individual treatment initiated with focus on functional transfer and gait assessment, setting patient up with appropriate equipment to be utilized during rehab stay, discussion of ELOS and POC, and discussion of goals. Pt received seated in bed, agreeable to PT session. No complaints of pain. Supine to sit with min A from flat bed with assist needed for trunk elevation. Sit to stand with mod A to RW with assist needed for LUE placement on RW. Pt unable to transfer with RW in standing due to LLE weakness. Squat pivot transfer to the R with min A. Pt somewhat impulsive with transfer and initiates prior to therapist being ready for transfer but pt does make it safely to w/c with min A. Ambulation x 30 ft, x 15 ft with R handrail and min to mod A with close w/c follow for safety. Pt exhibits flexed head and trunk, scissoring of LLE, L knee locked out in extension, and limited LLE DF during gait. Pt agreeable with encouragement to remain seated in w/c for lunch at end of session, needs in reach, quick release belt in place. Unable to obtain safety  alarm for patient, nursing aware.  Mobility Bed Mobility Bed Mobility: Rolling Right;Rolling Left;Supine to Sit;Sit to Supine Rolling Right: Minimal Assistance - Patient > 75% Rolling Left: Minimal Assistance - Patient > 75% Supine to Sit: Minimal Assistance - Patient > 75% Sit to Supine: Minimal Assistance - Patient > 75% Transfers Transfers: Sit to [employer] Transfers Sit to Stand: Moderate Assistance - Patient 50-74% Squat Pivot Transfers: Minimal Assistance - Patient > 75% Locomotion  Gait Gait Distance (Feet): 30 Feet Assistive device: Other (Comment) (R handrail) Gait Gait Pattern: Impaired (flexed head and trunk; scissoring LLE, L knee locked in extension, R lateral lean, dec DF LLE) Gait velocity: decreased Stairs / Additional Locomotion Stairs: No Wheelchair Mobility Wheelchair Mobility: No   Discharge Criteria: Patient will be discharged from PT if patient refuses treatment 3 consecutive times without medical reason, if treatment goals not met, if there is a change in medical status, if patient makes no progress towards goals or if patient is discharged from hospital.  The above assessment, treatment plan, treatment alternatives and goals were discussed and mutually agreed upon: by patient   Excell Seltzer, PT, DPT, CSRS 10/10/2020, 12:15 PM

## 2020-10-10 NOTE — Patient Care Conference (Signed)
Inpatient RehabilitationTeam Conference and Plan of Care Update Date: 10/10/2020   Time: 10:37 AM    Patient Name: Tracey Meyer      Medical Record Number: [id number]  Date of Birth: 07-22-1960 Sex: Female         Room/Bed: 4W06C/4W06C-01 Payor Info: Payor: MEDICAID POTENTIAL / Plan: MEDICAID POTENTIAL / Product Type: *No Product type* /    Admit Date/Time:  10/09/2020  1:37 PM  Primary Diagnosis:  Embolic stroke [facility])  Hospital Problems: Principal Problem:   Embolic stroke [facility])    Expected Discharge Date: Expected Discharge Date:  (Evals pending, ELOS 2-2.5 weeks)  Team Members Present: Physician leading conference: Dr. Alysia Penna Care Coodinator Present: Erlene Quan, BSW;Aden Sek Hervey Ard, RN, BSN, [place] Nurse Present: Dorien Chihuahua, RN PT Present: Page Spiro, PT OT Present: Clyda Greener, OT SLP Present: Other (comment) Annice Needy, SLP)     Current Status/Progress Goal Weekly Team Focus  Bowel/Bladder   Pt is continent of bowel and bladder  Pt will remain continent of bowel and bladder  Will assess qshift and PRN   Swallow/Nutrition/ Hydration             ADL's             Mobility             Communication             Safety/Cognition/ Behavioral Observations            Pain   Pt is pain free  Pt will remain pain free  Will assess qshift and PRN   Skin   Pt's skin is intact  Pt's skin will remain intact  Will assess qshift and PRN     Discharge Planning:  TBA   Team Discussion: Cerebellar stroke with new dx of DM, hx of HF and NSTEMI.  Patient on target to meet rehab goals: yes, currently min assist for upper body bathing and dressing and mod assist for bower body care. Requires mod assist for transfers due to loss of control of hip and note flexor tone in elbow but able to ambulate up to 5' with mod assist. Discharge goals set for CGA level.  *See Care Plan and progress notes for long and short-term goals.   Revisions to  Treatment Plan:    Teaching Needs: Safety, medications, secondary stroke risk management, etc.  Current Barriers to Discharge: Decreased caregiver support, Home enviroment access/layout, and New diabetic Significant other uses a cane but can provide physical assist  Possible Resolutions to Barriers: Family education with significant other     Medical Summary               I attest that I was present, lead the team conference, and concur with the assessment and plan of the team.   Dorien Chihuahua B 10/10/2020, 1:23 PM

## 2020-10-10 NOTE — Progress Notes (Signed)
Inpatient Rehabilitation  Patient information reviewed and entered into eRehab system by Jesyca Weisenburger M. Llesenia Fogal, M.A., CCC/SLP, PPS Coordinator.  Information including medical coding, functional ability and quality indicators will be reviewed and updated through discharge.    

## 2020-10-10 NOTE — Plan of Care (Signed)
  Problem: RH Balance Goal: LTG Patient will maintain dynamic sitting balance (PT) Description: LTG:  Patient will maintain dynamic sitting balance with assistance during mobility activities (PT) Flowsheets (Taken 10/10/2020 1222) LTG: Pt will maintain dynamic sitting balance during mobility activities with:: Independent with assistive device  Goal: LTG Patient will maintain dynamic standing balance (PT) Description: LTG:  Patient will maintain dynamic standing balance with assistance during mobility activities (PT) Flowsheets (Taken 10/10/2020 1222) LTG: Pt will maintain dynamic standing balance during mobility activities with:: Independent with assistive device    Problem: Sit to Stand Goal: LTG:  Patient will perform sit to stand with assistance level (PT) Description: LTG:  Patient will perform sit to stand with assistance level (PT) Flowsheets (Taken 10/10/2020 1222) LTG: PT will perform sit to stand in preparation for functional mobility with assistance level: Supervision/Verbal cueing   Problem: RH Bed Mobility Goal: LTG Patient will perform bed mobility with assist (PT) Description: LTG: Patient will perform bed mobility with assistance, with/without cues (PT). Flowsheets (Taken 10/10/2020 1222) LTG: Pt will perform bed mobility with assistance level of: Independent   Problem: RH Bed to Chair Transfers Goal: LTG Patient will perform bed/chair transfers w/assist (PT) Description: LTG: Patient will perform bed to chair transfers with assistance (PT). Flowsheets (Taken 10/10/2020 1222) LTG: Pt will perform Bed to Chair Transfers with assistance level: Supervision/Verbal cueing   Problem: RH Car Transfers Goal: LTG Patient will perform car transfers with assist (PT) Description: LTG: Patient will perform car transfers with assistance (PT). Flowsheets (Taken 10/10/2020 1222) LTG: Pt will perform car transfers with assist:: Supervision/Verbal cueing   Problem: RH Ambulation Goal: LTG  Patient will ambulate in controlled environment (PT) Description: LTG: Patient will ambulate in a controlled environment, # of feet with assistance (PT). Flowsheets (Taken 10/10/2020 1222) LTG: Pt will ambulate in controlled environ  assist needed:: Supervision/Verbal cueing LTG: Ambulation distance in controlled environment: 150 ft with LRAD Goal: LTG Patient will ambulate in home environment (PT) Description: LTG: Patient will ambulate in home environment, # of feet with assistance (PT). Flowsheets (Taken 10/10/2020 1222) LTG: Pt will ambulate in home environ  assist needed:: Supervision/Verbal cueing LTG: Ambulation distance in home environment: 75 ft with LRAD   Problem: RH Stairs Goal: LTG Patient will ambulate up and down stairs w/assist (PT) Description: LTG: Patient will ambulate up and down # of stairs with assistance (PT) Flowsheets (Taken 10/10/2020 1222) LTG: Pt will ambulate up/down stairs assist needed:: Minimal Assistance - Patient > 75% LTG: Pt will  ambulate up and down number of stairs: 3 stairs with handrails per home setup

## 2020-10-10 NOTE — Progress Notes (Signed)
[place] Individual Statement of Services  Patient Name:  Tracey Meyer  Date:  10/10/2020  Welcome to the [facility].  Our goal is to provide you with an individualized program based on your diagnosis and situation, designed to meet your specific needs.  With this comprehensive rehabilitation program, you will be expected to participate in at least 3 hours of rehabilitation therapies Monday-Friday, with modified therapy programming on the weekends.  Your rehabilitation program will include the following services:  Physical Therapy (PT), Occupational Therapy (OT), Speech Therapy (ST), 24 hour per day rehabilitation nursing, Therapeutic Recreaction (TR), Neuropsychology, Care Coordinator, Rehabilitation Medicine, Nutrition Services, Pharmacy Services, and Other  Weekly team conferences will be held on Wednesdays to discuss your progress.  Your Inpatient Rehabilitation Care Coordinator will talk with you frequently to get your input and to update you on team discussions.  Team conferences with you and your family in attendance may also be held.  Expected length of stay:  19-21 Days  Overall anticipated outcome:  Min A  Depending on your progress and recovery, your program may change. Your Inpatient Rehabilitation Care Coordinator will coordinate services and will keep you informed of any changes. Your Inpatient Rehabilitation Care Coordinator's name and contact numbers are listed  below.  The following services may also be recommended but are not provided by the [facility]:   [place] will be made to provide these services after discharge if needed.  Arrangements include referral to agencies that provide these services.  Your insurance has been verified to be:  UNINSURED Your primary doctor is:  NO PCP  Pertinent information will be shared with  your doctor and your insurance company.  Inpatient Rehabilitation Care Coordinator:  Erlene Quan, [place] or [phone]  Information discussed with and copy given to patient by: [name], 10/10/2020, 11:37 AM

## 2020-10-10 NOTE — Evaluation (Signed)
Speech Language Pathology Assessment and Plan  Patient Details  Name: Tracey Meyer MRN: [id number] Date of Birth: 03-13-1961  SLP Diagnosis: Dysarthria;Dysphagia;Cognitive Impairments  Rehab Potential: Good ELOS: 14-16 days    Today's Date: 10/10/2020 SLP Individual Time: 1355-1450 SLP Individual Time Calculation (min): 14 min   Hospital Problem: Principal Problem:   Embolic stroke [facility])  Past Medical History:  Past Medical History:  Diagnosis Date   Cardiomyopathy, ischemic 09/24/2020   DM (diabetes mellitus), type 2 ([place]) 09/24/2020   HTN (hypertension)    Hyperlipidemia    Prediabetes    S/P angioplasty with stent to mLAD 09/22/20  09/24/2020   Tobacco abuse 09/24/2020   Transaminitis 09/24/2020   Past Surgical History:  Past Surgical History:  Procedure Laterality Date   BUBBLE STUDY  10/02/2020   Procedure: BUBBLE STUDY;  Surgeon: Lelon Perla, MD;  Location: [facility] ENDOSCOPY;  Service: Cardiovascular;;   CORONARY/GRAFT ACUTE MI REVASCULARIZATION N/A 09/22/2020   Procedure: Coronary/Graft Acute MI Revascularization;  Surgeon: Martinique, Peter M, MD;  Location: [place] CV LAB;  Service: Cardiovascular;  Laterality: N/A;   LEFT HEART CATH AND CORONARY ANGIOGRAPHY N/A 09/22/2020   Procedure: LEFT HEART CATH AND CORONARY ANGIOGRAPHY;  Surgeon: Martinique, Peter M, MD;  Location: [place] CV LAB;  Service: Cardiovascular;  Laterality: N/A;   mastectomy Left    at age 28 due to concerns of cancer   TEE WITHOUT CARDIOVERSION N/A 10/02/2020   Procedure: TRANSESOPHAGEAL ECHOCARDIOGRAM (TEE);  Surgeon: Lelon Perla, MD;  Location: [facility] ENDOSCOPY;  Service: Cardiovascular;  Laterality: N/A;    Assessment / Plan / Recommendation Clinical Impression Patient is a 60 year old female with history of HTN, dyslipidemia, pre-diabetes but lack of medical care for years who was evaluated at [facility] on 09/22/20 for upper abdominal pain, N/V, diarrhea and diaphoresis. She was noted to  have malignant HTN with BP 215/110 and EKG showed NSTEMI therefore she was transferred to [facility] for emergent cardiac cath. This revealed 100% occlusion of mild LAD which was treated with PCI of mid LAD with DES X 1 by Dr. Ellyn Hack.  She was discharged to home on 07/04 but readmitted on 07/07 with reports of left sided weakness and slurred speech that began evening of discharge and persisted.  She also had issues with increasing SOB and orthopnea with decreasing urine output and presented to the ED for work-up.  MRI brain done showing multiple scattered acute to early subacute infarcts involving bilateral cerebral and cerebellar hemisphere with largest area of infarct in right occipital cortex and felt to be central thromboembolic etiology.  MRA showed moderate stenosis of distal left V4 segment, distal basilar artery and cavernous right ICA and occlusion of right V4 segment beyond takeoff of right PICA and 70% plaque L-ICA changes.    TEE for work-up and showed moderate severe LV dysfunction and no obvious apical thrombus, small pericardial effusion and EF of 30 to 35%.  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 fib after discharge.  Dr. Erlinda Hong recommends long-term BP goal 130-150 given significant posterior circulation vascular stenosis.  She continues to be limited by left hemiplegia affecting ADLs and mobility. CIR recommended due to functional decline and patient admitted 10/09/20.  SLP administered the [facility] Mental Status Examination (SLUMS). Patient scored  28/30 points with a score of 27 or above considered normal which is an improvement of 11 points since initial evaluation on 09/30/20. Although  patient scored WFL on the standardized assessment, deficits in short-term memory, problem solving and overall awareness were noted during informal assessment. Patient also frequently became short of breath during communication that impacted her  intelligibility when combined with a low vocal intensity, hoarse vocal quality and increased speech rate. Patient consumed a snack of regular textures with thin liquids without overt s/s of aspiration noted. Patient demonstrated impaired mastication with regular textures and continues to endorse a globus sensation with belching noted. SLP provided education regarding reflux precautions. Patient verbalized understanding but will need reinforcement. Recommend patient continue current diet. Patient would benefit from skilled SLP intervention to maximize her cognitive and swallowing function as well as her speech intelligibility prior to discharge.    Skilled Therapeutic Interventions          Administered a cognitive-linguistic evaluation and BSE, please see above for details.   SLP Assessment  Patient will need skilled Speech Lanaguage Pathology Services during CIR admission    Recommendations  SLP Diet Recommendations: Age appropriate regular solids;Thin Liquid Administration via: Cup;Straw Medication Administration: Whole meds with liquid Supervision: Patient able to self feed Compensations: Slow rate;Small sips/bites Postural Changes and/or Swallow Maneuvers: Seated upright 90 degrees;Upright 30-60 min after meal Oral Care Recommendations: Oral care BID Patient destination: Home Follow up Recommendations:  (TBD) Equipment Recommended: None recommended by SLP    SLP Frequency 3 to 5 out of 7 days   SLP Duration  SLP Intensity  SLP Treatment/Interventions 14-16 days  Minumum of 1-2 x/day, 30 to 90 minutes  Cognitive remediation/compensation;Dysphagia/aspiration precaution training;Internal/external aids;Speech/Language facilitation;Therapeutic Activities;Environmental controls;Cueing hierarchy;Functional tasks;Patient/family education    Pain No/Denies Pain   Prior Functioning Type of Home: Mobile home  Lives With: Significant other Available Help at Discharge: Family;Available 24  hours/day Vocation: Part time employment  SLP Evaluation Cognition Overall Cognitive Status: Impaired/Different from baseline Arousal/Alertness: Awake/alert Orientation Level: Oriented X4 Attention: Sustained Focused Attention: Appears intact Sustained Attention: Appears intact Memory: Impaired Memory Impairment: Decreased recall of new information Immediate Memory Recall: Sock;Blue;Bed Memory Recall Sock: Without Cue Memory Recall Blue: Without Cue Memory Recall Bed: With Cue Awareness: Impaired Awareness Impairment: Emergent impairment Problem Solving: Impaired Problem Solving Impairment: Verbal complex Behaviors: Impulsive Safety/Judgment: Impaired Comments: slightly impulsive  Comprehension Auditory Comprehension Overall Auditory Comprehension: Appears within functional limits for tasks assessed Expression Expression Primary Mode of Expression: Verbal Verbal Expression Overall Verbal Expression: Appears within functional limits for tasks assessed Written Expression Dominant Hand: Right Oral Motor Oral Motor/Sensory Function Overall Oral Motor/Sensory Function: Mild impairment Lingual Strength: Reduced;Suspected CN XII (hypoglossal) dysfunction Motor Speech Overall Motor Speech: Impaired Respiration: Impaired Level of Impairment: Conversation Phonation: Low vocal intensity;Hoarse Resonance: Within functional limits Articulation: Impaired Level of Impairment: Conversation Intelligibility: Intelligibility reduced Word: 75-100% accurate Phrase: 75-100% accurate Sentence: 75-100% accurate Conversation: 75-100% accurate Motor Planning: Witnin functional limits Effective Techniques: Slow rate;Increased vocal intensity  Care Tool Care Tool Cognition Expression of Ideas and Wants Expression of Ideas and Wants: Some difficulty - exhibits some difficulty with expressing needs and ideas (e.g, some words or finishing thoughts) or speech is not clear   Understanding  Verbal and Non-Verbal Content Understanding Verbal and Non-Verbal Content: Usually understands - understands most conversations, but misses some part/intent of message. Requires cues at times to understand   Memory/Recall Ability *first 3 days only Memory/Recall Ability *first 3 days only: Current season;Location of own room;Staff names and faces;That he or she is in a hospital/hospital unit    Bedside Swallowing Assessment General Date of Onset: 09/29/20  Previous Swallow Assessment: MBS on 7/11: Recommended regular textures with thin liquids Diet Prior to this Study: Regular;Thin liquids Temperature Spikes Noted: No Respiratory Status: Room air History of Recent Intubation: No Behavior/Cognition: Alert;Cooperative;Pleasant mood Oral Cavity - Dentition: Missing dentition;Adequate natural dentition Self-Feeding Abilities: Able to feed self Vision: Functional for self-feeding Patient Positioning: Upright in bed Baseline Vocal Quality: Low vocal intensity;Hoarse Volitional Cough: Weak Volitional Swallow: Able to elicit  Thin Liquid Thin Liquid: Within functional limits Presentation: Straw;Cup Nectar Thick Nectar Thick Liquid: Not tested Honey Thick Honey Thick Liquid: Not tested Puree Puree: Not tested Solid Solid: Impaired Presentation: Self Fed;Spoon Oral Phase Functional Implications: Impaired mastication BSE Assessment Suspected Esophageal Findings Suspected Esophageal Findings: Globus sensation;Belching Risk for Aspiration Impact on safety and function: Mild aspiration risk Other Related Risk Factors: Cognitive impairment  Short Term Goals: Week 1: SLP Short Term Goal 1 (Week 1): Patient will consume current diet without overt s/s of aspiration with Mod I for use of swallowing compensatory strategies to decrease globus sensation (upright 90 degrees for meals, sit up 30-60 mins after meals, follow solids/liquids). SLP Short Term Goal 2 (Week 1): Patient will utilize speech  intelligibility strategies at the sentence level to maximize intelligibility and decrease shortness of breath with overall Min verbal cues. SLP Short Term Goal 3 (Week 1): Patient will demosntrate complex problem solving for functional tasks with supervision verbal cues. SLP Short Term Goal 4 (Week 1): Patient will self-monitor and correct erorrs during complex tasks with supervision verbal cues. SLP Short Term Goal 5 (Week 1): Patient will utilize memory compensatory strategies to recall functional information with Min verbal and visual cues.  Refer to Care Plan for Long Term Goals  Recommendations for other services: None   Discharge Criteria: Patient will be discharged from SLP if patient refuses treatment 3 consecutive times without medical reason, if treatment goals not met, if there is a change in medical status, if patient makes no progress towards goals or if patient is discharged from hospital.  The above assessment, treatment plan, treatment alternatives and goals were discussed and mutually agreed upon: by patient  Laverta Harnisch 10/10/2020, 3:24 PM

## 2020-10-10 NOTE — Plan of Care (Signed)
Nutrition Education Note  RD consulted for nutrition diet education.  Lipid Panel     Component Value Date/Time   CHOL 186 09/28/2020 0404   TRIG 100 09/28/2020 0404   HDL 36 (L) 09/28/2020 0404   CHOLHDL 5.2 09/28/2020 0404   VLDL 20 09/28/2020 0404   LDLCALC 130 (H) 09/28/2020 0404    RD provided "Heart Healthy Consistent Carbohydrate Nutrition Therapy" handout from the Academy of Nutrition and Dietetics. Reviewed patient's dietary recall. Pt reports usually consuming home cooked meals at meals with usual intake of at least 2-3 meals a day. Discouraged intake of processed foods and use of salt shaker. Encouraged fresh fruits and vegetables as well as whole grain sources of carbohydrates to maximize fiber intake. Teach back method used.  Expect good compliance.  Current diet order is heart healthy/carbohydrate modified, patient is consuming approximately 100% of meals at this time. Pt reports having a good appetite with no difficulties. Labs and medications reviewed. No further nutrition interventions warranted at this time. RD contact information provided. If additional nutrition issues arise, please re-consult RD.  Tracey Parker, MS, RD, LDN RD pager number/after hours weekend pager number on Amion.

## 2020-10-10 NOTE — Progress Notes (Signed)
Inpatient Rehabilitation Care Coordinator Assessment and Plan Patient Details  Name: Tracey Meyer MRN: [id number] Date of Birth: 07-29-1960  Today's Date: 10/10/2020  Hospital Problems: Principal Problem:   Embolic stroke [facility])  Past Medical History:  Past Medical History:  Diagnosis Date   Cardiomyopathy, ischemic 09/24/2020   DM (diabetes mellitus), type 2 ([place]) 09/24/2020   HTN (hypertension)    Hyperlipidemia    Prediabetes    S/P angioplasty with stent to mLAD 09/22/20  09/24/2020   Tobacco abuse 09/24/2020   Transaminitis 09/24/2020   Past Surgical History:  Past Surgical History:  Procedure Laterality Date   BUBBLE STUDY  10/02/2020   Procedure: BUBBLE STUDY;  Surgeon: Lelon Perla, MD;  Location: [facility] ENDOSCOPY;  Service: Cardiovascular;;   CORONARY/GRAFT ACUTE MI REVASCULARIZATION N/Meyer 09/22/2020   Procedure: Coronary/Graft Acute MI Revascularization;  Surgeon: Martinique, Peter M, MD;  Location: [place] CV LAB;  Service: Cardiovascular;  Laterality: N/Meyer;   LEFT HEART CATH AND CORONARY ANGIOGRAPHY N/Meyer 09/22/2020   Procedure: LEFT HEART CATH AND CORONARY ANGIOGRAPHY;  Surgeon: Martinique, Peter M, MD;  Location: [place] CV LAB;  Service: Cardiovascular;  Laterality: N/Meyer;   mastectomy Left    at age 8 due to concerns of cancer   TEE WITHOUT CARDIOVERSION N/Meyer 10/02/2020   Procedure: TRANSESOPHAGEAL ECHOCARDIOGRAM (TEE);  Surgeon: Lelon Perla, MD;  Location: [facility] ENDOSCOPY;  Service: Cardiovascular;  Laterality: N/Meyer;   Social History:  reports that she has been smoking cigarettes. She has been smoking an average of .5 packs per day. She has never used smokeless tobacco. She reports that she does not use drugs. No history on file for alcohol use.  Family / Support Systems Marital Status: Single Spouse/Significant Other: Tracey Meyer Other Supports: Tracey Meyer (Sister), Tracey Meyer (Sister) Anticipated Caregiver: Tracey Meyer Ability/Limitations of Caregiver:  n/Meyer Caregiver Availability: 24/7  Social History Preferred language: English Religion: Baptist Read: Yes Write: Yes Legal History/Current Legal Issues: n/Meyer Guardian/Conservator: n/Meyer   Abuse/Neglect Abuse/Neglect Assessment Can Be Completed: Yes Physical Abuse: Denies Verbal Abuse: Denies Sexual Abuse: Denies Exploitation of patient/patient's resources: Denies Self-Neglect: Denies  Emotional Status Recent Psychosocial Issues: coping Psychiatric History: n/Meyer Substance Abuse History: n/Meyer  Patient / Family Perceptions, Expectations & Goals Pt/Family understanding of illness & functional limitations: yes Premorbid pt/family roles/activities: patient previosuly independent Anticipated changes in roles/activities/participation: S.O able to assist Pt/family expectations/goals: Tracey Meyer  [employer]: None Premorbid 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) Transportation available at discharge: Family able to transport Resource referrals recommended: Neuropsychology (coping)  Discharge Planning Living Arrangements: Spouse/significant other Support Systems: Spouse/significant other Type of Residence: Private residence (1 level home, 3 steps) Insurance Resources: Self-pay (uninsured) Museum/gallery curator Screen Referred: No Living Expenses: Education officer, community Management: Patient, Significant Other Does the patient have any problems obtaining your medications?: Yes (Describe) (uninsured) Home Management: independent Patient/Family Preliminary Plans: s.o able to assist Care Coordinator Barriers to Discharge: Insurance for SNF coverage, Decreased caregiver support Care Coordinator Anticipated Follow Up Needs: [place] Additional Notes/Comments: patient unisured Expected length of stay: 19-21 [place]  Clinical Impression SW met with patient, introduced self and explained role. Patient plans to d/c home with spouse. No additional questions or concerns, sw will  continue to follow up  [name] 10/10/2020, 1:26 PM

## 2020-10-10 NOTE — Progress Notes (Signed)
Patient ID: Tracey Meyer, female   DOB: Jan 02, 1961, 60 y.o.   MRN: [id number] Team Conference Report to Patient/Family  Team Conference discussion was reviewed with the patient and caregiver, including goals, any changes in plan of care and target discharge date.  Patient and caregiver express understanding and are in agreement.  The patient has a target discharge date of  (Evals pending, ELOS 2-2.5 weeks).  SW met with patient, introduced self and explained role. Patient plans to d/c home with spouse. No additional questions or concerns, sw will continue to follow up  [name] 10/10/2020, 1:27 PM

## 2020-10-10 NOTE — Progress Notes (Signed)
PROGRESS NOTE   Subjective/Complaints: No pains , still feels weak on Left side  ROS- Neg CP, SOB, N/V/D   Objective:   No results found. Recent Labs    10/08/20 0124 10/10/20 0510  WBC 6.6 6.8  HGB 10.9* 11.2*  HCT 33.4* 34.6*  PLT 390 412*   Recent Labs    10/08/20 0124 10/10/20 0510  NA 136 136  K 4.0 4.1  CL 106 108  CO2 22 23  GLUCOSE 142* 137*  BUN 16 15  CREATININE 1.04* 1.17*  CALCIUM 9.6 9.3    Intake/Output Summary (Last 24 hours) at 10/10/2020 1032 Last data filed at 10/10/2020 0750 Gross per 24 hour  Intake 300 ml  Output --  Net 300 ml        Physical Exam: Vital Signs Blood pressure 137/67, pulse 69, temperature 98.1 F (36.7 C), temperature source Oral, resp. rate 16, height 5\' 4"  (1.626 m), weight 71.4 kg, SpO2 100 %.   General: No acute distress Mood and affect are appropriate Heart: Regular rate and rhythm no rubs murmurs or extra sounds Lungs: Clear to auscultation, breathing unlabored, no rales or wheezes Abdomen: Positive bowel sounds, soft nontender to palpation, nondistended Extremities: No clubbing, cyanosis, or edema Skin: No evidence of breakdown, no evidence of rash Neurologic: Cranial nerves II through XII intact, motor strength is 5/5 in right deltoid, bicep, tricep, grip, hip flexor, knee extensors, ankle dorsiflexor and plantar flexor 2/5 Left deltoid, biceps, triceps , 0/5 grip, 2- HF, KE, 0/5 ADF Sensory exam normal sensation to light touch and proprioception in bilateral upper and lower extremities Cerebellar exam normal finger to nose to finger as well as heel to shin in bilateral upper and lower extremities Musculoskeletal: Full range of motion in all 4 extremities. No joint swelling   Assessment/Plan: 1. Functional deficits which require 3+ hours per day of interdisciplinary therapy in a comprehensive inpatient rehab setting. Physiatrist is providing close team  supervision and 24 hour management of active medical problems listed below. Physiatrist and rehab team continue to assess barriers to discharge/monitor patient progress toward functional and medical goals  Care Tool:  Bathing              Bathing assist       Upper Body Dressing/Undressing Upper body dressing        Upper body assist Assist Level: Minimal Assistance - Patient > 75%    Lower Body Dressing/Undressing Lower body dressing      What is the patient wearing?: Hospital gown only     Lower body assist       Toileting Toileting    Toileting assist Assist for toileting: Moderate Assistance - Patient 50 - 74%     Transfers Chair/bed transfer  Transfers assist           Locomotion Ambulation   Ambulation assist              Walk 10 feet activity   Assist           Walk 50 feet activity   Assist           Walk 150 feet activity   Assist  Walk 10 feet on uneven surface  activity   Assist           Wheelchair     Assist               Wheelchair 50 feet with 2 turns activity    Assist            Wheelchair 150 feet activity     Assist          Blood pressure 137/67, pulse 69, temperature 98.1 F (36.7 C), temperature source Oral, resp. rate 16, height 5\' 4"  (1.626 m), weight 71.4 kg, SpO2 100 %.  Medical Problem List and Plan: 1.  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  Antithrombotics: -DVT/anticoagulation:  Pharmaceutical: Lovenox             -antiplatelet therapy: ASA/Brillinta 3. Pain Management: Tylenol prn.  4. Mood: LCSW to follow for evaluation and support.              -antipsychotic agents: N/A 5. Neuropsych: This patient is capable of making decisions on her own behalf. 6. Skin/Wound Care: Routine pressure relief measures.  7.  Fluids/Electrolytes/Nutrition: Monitor I/O.Heart healthy diet. 8.  NSTEMI s/p PCI/DES: On ASA/Brillinta 9. Chronic combined CHF: Monitor for signs of overload. Check weights daily             --On Entresto, Coreg, Spironolactone and Crestor.             --SBP goal 130-150 to prevent cerebral hypoperfusion. Vitals:   10/09/20 1958 10/10/20 0545  BP: 118/61 137/67  Pulse: 76 69  Resp: 16 16  Temp: 98.4 F (36.9 C) 98.1 F (36.7 C)  SpO2: 98% 100%   Controlled in range this am 10. T2DM: Hgb A1C-6.9. Monitor BS ac/hs. Educate on CM diet.Newly diagnosed CBG (last 3)  Recent Labs    10/09/20 1603 10/09/20 2101 10/10/20 0625  GLUCAP 115* 165* 119*  Controlled 7/20  -- Continue metformin bid (recently added)  11. Anemia: Likely due to DAPT/Lovenox. Reported to have some pink vaginal discharge--vaginally v/s bladder source?             --monitor for signs of bleeding. Monitor serial H/H. 12. Constipation: No BM for 2 weeks. Will add Senna and warm prune juice. Does not want to try anything stronger for now.      LOS: 1 days A FACE TO FACE EVALUATION WAS PERFORMED  Tracey Meyer 10/10/2020, 10:32 AM

## 2020-10-10 NOTE — Evaluation (Signed)
Occupational Therapy Assessment and Plan  Patient Details  Name: Tracey Meyer MRN: [id number] Date of Birth: Dec 16, 1960  OT Diagnosis: abnormal posture, acute pain, cognitive deficits, hemiplegia affecting non-dominant side, and muscle weakness (generalized) Rehab Potential: Rehab Potential (ACUTE ONLY): Excellent ELOS: 17-21 days   Today's Date: 10/10/2020 OT Individual Time: 0907-1000 OT Individual Time Calculation (min): 53 min     Hospital Problem: Principal Problem:   Embolic stroke [facility])   Past Medical History:  Past Medical History:  Diagnosis Date   Cardiomyopathy, ischemic 09/24/2020   DM (diabetes mellitus), type 2 ([place]) 09/24/2020   HTN (hypertension)    Hyperlipidemia    Prediabetes    S/P angioplasty with stent to mLAD 09/22/20  09/24/2020   Tobacco abuse 09/24/2020   Transaminitis 09/24/2020   Past Surgical History:  Past Surgical History:  Procedure Laterality Date   BUBBLE STUDY  10/02/2020   Procedure: BUBBLE STUDY;  Surgeon: Lelon Perla, MD;  Location: [facility] ENDOSCOPY;  Service: Cardiovascular;;   CORONARY/GRAFT ACUTE MI REVASCULARIZATION N/A 09/22/2020   Procedure: Coronary/Graft Acute MI Revascularization;  Surgeon: Martinique, Peter M, MD;  Location: [place] CV LAB;  Service: Cardiovascular;  Laterality: N/A;   LEFT HEART CATH AND CORONARY ANGIOGRAPHY N/A 09/22/2020   Procedure: LEFT HEART CATH AND CORONARY ANGIOGRAPHY;  Surgeon: Martinique, Peter M, MD;  Location: [place] CV LAB;  Service: Cardiovascular;  Laterality: N/A;   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 year old female with recent admission to the hospital on 07/07 with reports of left sided weakness and slurred  speech that began evening of discharge and persisted.  She also had issues with increasing SOB and orthopnea with decreasing urine output and presented to the ED for work-up.  MRI brain done showing multiple scattered acute to early subacute infarcts involving bilateral cerebral and cerebellar hemisphere with largest area of infarct in right occipital cortex and felt to be central thromboembolic etiology.  Patient transferred to CIR on 10/09/2020 .    Patient currently requires max with basic self-care skills secondary to muscle weakness, muscle joint tightness, and muscle paralysis, impaired timing and sequencing, abnormal tone, unbalanced muscle activation, and decreased coordination, decreased memory, and decreased sitting balance, decreased standing balance, decreased postural control, hemiplegia, and decreased balance strategies.  Prior to hospitalization, patient could complete ADLs and IADLs with independent .  Patient will benefit from skilled intervention to decrease level of assist with basic self-care skills and increase independence with basic self-care skills prior to discharge home with care partner.  Anticipate patient will require 24 hour supervision and follow up home health.  OT - End of Session Activity Tolerance: Tolerates 30+ min activity with multiple rests Endurance Deficit: Yes Endurance Deficit Description: fatigues quickly with functional activity OT Assessment Rehab Potential (ACUTE ONLY): Excellent OT Barriers to Discharge: Decreased caregiver support OT Barriers to Discharge Comments: Spouse cannot provide physical assist. OT Patient demonstrates impairments in the following area(s): Balance;Cognition;Endurance;Motor;Pain;Sensory;Safety OT Basic ADL's Functional Problem(s): Eating;Grooming;Bathing;Dressing;Toileting OT Advanced ADL's Functional Problem(s): Simple Meal Preparation OT Transfers Functional Problem(s): Toilet;Tub/Shower OT Additional Impairment(s): Fuctional  Use of Upper Extremity OT Plan OT Intensity: Minimum of 1-2 x/day, 45 to 90 minutes OT Frequency: 5 out of 7  days OT Duration/Estimated Length of Stay: 17-21 days OT Treatment/Interventions: Balance/vestibular training;Cognitive remediation/compensation;Community reintegration;DME/adaptive equipment instruction;Disease mangement/prevention;Discharge planning;Functional electrical stimulation;Functional mobility training;Neuromuscular re-education;Psychosocial support;Patient/family education;Pain management;Self Care/advanced ADL retraining;Splinting/orthotics;UE/LE Strength taining/ROM;Therapeutic Exercise;UE/LE Coordination activities;Therapeutic Activities;Wheelchair propulsion/positioning OT Self Feeding Anticipated Outcome(s): modified independent OT Basic Self-Care Anticipated Outcome(s): min guard to supervision OT Toileting Anticipated Outcome(s): min guard OT Bathroom Transfers Anticipated Outcome(s): min guard OT Recommendation Recommendations for Other Services: Therapeutic Recreation consult Therapeutic Recreation Interventions: Stress management Patient destination: Home Follow Up Recommendations: Home health OT;24 hour supervision/assistance Equipment Recommended: To be determined   OT Evaluation Precautions/Restrictions  Precautions Precautions: Fall Precaution Comments: L hemiparesis Restrictions Weight Bearing Restrictions: No Therapy Vitals Temp: 98 F (36.7 C) Pulse Rate: 63 Resp: 18 BP: 129/80 Patient Position (if appropriate): Lying Oxygen Therapy SpO2: 95 % O2 Device: Room Air Pain   Home Living/Prior Functioning Home Living Family/patient expects to be discharged to:: Private residence Living Arrangements: Spouse/significant other Available Help at Discharge: Family, Available 24 hours/day Type of Home: Mobile home Home Access: Stairs to enter Technical brewer of Steps: 3 Entrance Stairs-Rails: None Home Layout: One level Bathroom  Shower/Tub: Government social research officer Accessibility: Yes Additional Comments: Significant other cannot provide physical assist.  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 Vision Baseline Vision/History: No visual deficits (reports that she is supposed to have glasses, but does not) Patient Visual Report: Blurring of vision Vision Assessment?: Yes Ocular Range of Motion: Within Functional Limits Alignment/Gaze Preference: Within Defined Limits Tracking/Visual Pursuits: Able to track stimulus in all quads without difficulty Saccades: Within functional limits Visual Fields: No apparent deficits Perception  Perception: Within Functional Limits Praxis Praxis: Intact Cognition Overall Cognitive Status: Within Functional Limits for tasks assessed Arousal/Alertness: Awake/alert Orientation Level: Person;Place;Situation Person: Oriented Place: Oriented Situation: Oriented Year: 2022 Month: July Day of Week: Correct Memory: Impaired Memory Impairment: Retrieval deficit;Decreased recall of new information Immediate Memory Recall: Sock;Blue;Bed Memory Recall Sock: Without Cue Memory Recall Blue: Without Cue Memory Recall Bed: With Cue Attention: Sustained Focused Attention: Appears intact Sustained Attention: Appears intact Awareness: Impaired Awareness Impairment: Emergent impairment Problem Solving: Impaired Behaviors: Impulsive Safety/Judgment: Impaired Comments: slightly impulsive Sensation Sensation Light Touch: Appears Intact Light Touch  Impaired Details: Impaired LUE;Impaired LLE Hot/Cold: Appears Intact Proprioception: Appears Intact Proprioception Impaired Details: Impaired LUE;Impaired LLE Stereognosis: Not tested Additional Comments: Light touch and proprioception intact in BUEs with gross testing. Coordination Gross Motor Movements are Fluid and Coordinated: No Fine Motor Movements are Fluid and Coordinated: No Coordination and Movement Description: LUE hemiparesis at Brunnstrum stage III level.  Max hand over hand for intergration as an active assist to wash the RUE. Motor  Motor Motor: Hemiplegia;Abnormal tone;Abnormal postural alignment and control Motor - Skilled Clinical Observations: LUE and LLE hemiparesis  Trunk/Postural Assessment  Cervical Assessment Cervical Assessment: Exceptions to WFL (slight forward protraction) Thoracic Assessment Thoracic Assessment: Exceptions to [facility] (thoracic rounding and left scapular winging) Lumbar Assessment Lumbar Assessment: Exceptions to [facility] (posterior pelvic tilt) Postural Control Postural Control: Deficits on evaluation Trunk Control: decreased control Righting Reactions: delayed  Balance Balance Balance Assessed: Yes Static Sitting Balance Static Sitting - Balance Support: No upper extremity supported;Feet supported Static Sitting - Level of Assistance: 5:  Stand by assistance Dynamic Sitting Balance Dynamic Sitting - Balance Support: During functional activity;Feet unsupported Dynamic Sitting - Level of Assistance: 4: Min assist Static Standing Balance Static Standing - Balance Support: During functional activity;Right upper extremity supported Static Standing - Level of Assistance: 4: Min assist Dynamic Standing Balance Dynamic Standing - Balance Support: During functional activity Dynamic Standing - Level of Assistance: 3: Mod assist Extremity/Trunk Assessment RUE Assessment RUE Assessment: Within Functional Limits General Strength Comments: AROM and  strength WFLS for selfcare tasks, but not formally tested. LUE Assessment LUE Assessment: Exceptions to [facility] Passive Range of Motion (PROM) Comments: shoulder flexion 0-160 degrees, elbow flexion WFLs with some pain at end range,  wrist extension and flexion WFLS but pt with increased pain in wrist extension secondary to IV.  Slight limitations in digit flexion secondary to PIP and MP tightness. Active Range of Motion (AROM) Comments: Brunnstrum stage III in the arm and hand.  Approximately 10% digit flexion and extension.  Synergy pattern developing with shoulder and elbow movements.  Slight tone noted in the elbow and digits at 1/4 on the Modified [place] Scale.  Care Tool Care Tool Self Care Eating   Eating Assist Level: Supervision/Verbal cueing    Oral Care    Oral Care Assist Level: Minimal Assistance - Patient > 75%    Bathing   Body parts bathed by patient: Chest;Abdomen;Left arm;Right upper leg;Left upper leg;Face;Right lower leg Body parts bathed by helper: Front perineal area;Buttocks;Left lower leg   Assist Level: Moderate Assistance - Patient 50 - 74%    Upper Body Dressing(including orthotics)   What is the patient wearing?: Pull over shirt   Assist Level: Minimal Assistance - Patient > 75%    Lower Body Dressing (excluding footwear)   What is the patient wearing?: Pants Assist for lower body dressing: Maximal Assistance - Patient 25 - 49%    Putting on/Taking off footwear   What is the patient wearing?: Non-skid slipper socks Assist for footwear: Maximal Assistance - Patient 25 - 49%       Care Tool Toileting Toileting activity   Assist for toileting: Maximal Assistance - Patient 25 - 49%     Care Tool Bed Mobility Roll left and right activity   Roll left and right assist level: Minimal Assistance - Patient > 75%    Sit to lying activity   Sit to lying assist level: Minimal Assistance - Patient > 75%    Lying to sitting edge of bed activity   Lying to  sitting edge of bed assist level: Minimal Assistance - Patient > 75%     Care Tool Transfers Sit to stand transfer   Sit to stand assist level: Moderate Assistance - Patient 50 - 74%    Chair/bed transfer   Chair/bed transfer assist level: Maximal Assistance - Patient 25 - 49% (stand pivot)     Toilet transfer   Assist Level: Maximal Assistance - Patient 24 - 49%     Care Tool Cognition Expression of Ideas and Wants Expression of Ideas and Wants: Some difficulty - exhibits some difficulty with expressing needs and ideas (e.g, some words or finishing thoughts) or speech is not clear   Understanding Verbal and Non-Verbal Content Understanding Verbal and Non-Verbal Content: Usually understands - understands most conversations, but misses some part/intent of message. Requires cues at times to understand   Memory/Recall Ability *first 3 days only Memory/Recall Ability *first 3 days only: Current season;Location of own room;Staff names and faces;That he or she  is in a hospital/hospital unit    Refer to Care Plan for Long Term Goals  SHORT TERM GOAL WEEK 1 OT Short Term Goal 1 (Week 1): Pt will complete UB dressing with supervision for pullover shirt. OT Short Term Goal 2 (Week 1): Pt will complete LB bathing sit to stand with min assist. OT Short Term Goal 3 (Week 1): Pt will complete LB dressing with min assist sit to stand for underpants and pants only. OT Short Term Goal 4 (Week 1): Pt will complete stand pivot transfer to the 3:1 with min assist. OT Short Term Goal 5 (Week 1): Pt will use the LUE as a gross assist with supervision during bathing and grooming tasks.  Recommendations for other services: Therapeutic Recreation  Stress management   Skilled Therapeutic Intervention ADL ADL Eating: Supervision/safety Where Assessed-Eating: Wheelchair Grooming: Minimal assistance Where Assessed-Grooming: Edge of bed Upper Body Bathing: Minimal assistance Where Assessed-Upper Body  Bathing: Edge of bed Lower Body Bathing: Moderate assistance Where Assessed-Lower Body Bathing: Edge of bed Upper Body Dressing: Minimal assistance Where Assessed-Upper Body Dressing: Edge of bed Lower Body Dressing: Maximal assistance Where Assessed-Lower Body Dressing: Edge of bed Toileting: Maximal assistance Where Assessed-Toileting: Bedside Commode Toilet Transfer: Maximal assistance Toilet Transfer Method: Stand pivot Science writer: Engineer, technical sales Transfer: Not assessed Social research officer, government: Not assessed Mobility  Bed Mobility Bed Mobility: Supine to Sit;Sit to Supine Rolling Right: Minimal Assistance - Patient > 75% Rolling Left: Minimal Assistance - Patient > 75% Supine to Sit: Minimal Assistance - Patient > 75% Sit to Supine: Minimal Assistance - Patient > 75% Transfers Sit to Stand: Moderate Assistance - Patient 50-74% Stand to Sit: Moderate Assistance - Patient 50-74% Session 1:  Pt worked on bathing and dressing sit to stand from the EOB during session.  Min assist supine to sit with close supervision for static sitting balance.  She was able to maintain dynamic sitting balance with min assist while completing selfcare tasks.  Mod assist for sit to stand with LB selfcare with max assist for threading garments over the LLE.  She was able to donn a pullover shirt with min assist as well.  Max assist for stand pivot transfer to the bedside chair and back for simulated toilet transfer.  LUE hemiparesis noted with movement currently at [facility] stage III in the arm and hand.  Max assist needed for integration as an active assist for washing the right arm with min assist for use as a gross assist to stabilize the deodorant for opening.  Finished session with transition to supine at min assist level to rest.  Discussed expectations for LOS of 2.5-3 weeks with min guard to supervision goals as spouse cannot provide much physical assist.  She is in agreement  with plan at this time.    Session 2: ([id number])  Pt completed toilet transfer to begin session from wheelchair stand pivot with max assist.  Mod assist for clothing management sit to stand as well as hygiene.  She exhibits increased trunk flexion and hip flexion in standing and during weightbearing on the LLE secondary to weakness.  She washed her hands at the sink with mod assist and then completed oral hygiene in sitting at min assist level.  Educated her on self AAROM exercises for the LUE to be completed with handout provided for shoulder and elbow flexion, wrist flexion/extension, and digit flexion/extension.  She was able to complete return demonstration with mod demonstrational cueing.  Finished session with short distance  mobility of approximately 5' to the bed with max assist and no assistive device.  Max assist needed initially for advancing the LLE but she progressed to only needing min assist after a few steps.  Min assist for sit to supine to finish session.  Call button and phone in reach with safety alarm in place and LUE supported on pillows.    Discharge Criteria: Patient will be discharged from OT if patient refuses treatment 3 consecutive times without medical reason, if treatment goals not met, if there is a change in medical status, if patient makes no progress towards goals or if patient is discharged from hospital.  The above assessment, treatment plan, treatment alternatives and goals were discussed and mutually agreed upon: by patient  Kelle Ruppert OTR/L 10/10/2020, 1:57 PM

## 2020-10-11 LAB — GLUCOSE, CAPILLARY
Glucose-Capillary: 129 mg/dL — ABNORMAL HIGH (ref 70–99)
Glucose-Capillary: 146 mg/dL — ABNORMAL HIGH (ref 70–99)
Glucose-Capillary: 120 mg/dL — ABNORMAL HIGH (ref 70–99)
Glucose-Capillary: 171 mg/dL — ABNORMAL HIGH (ref 70–99)

## 2020-10-11 NOTE — Progress Notes (Signed)
Speech Language Pathology Daily Session Note  Patient Details  Name: Tracey Meyer MRN: [id number] Date of Birth: [birth year]  Today's Date: 10/11/2020 SLP Individual Time: 1000-1100 SLP Individual Time Calculation (min): 60 min  Short Term Goals: Week 1: SLP Short Term Goal 1 (Week 1): Patient will consume current diet without overt s/s of aspiration with Mod I for use of swallowing compensatory strategies to decrease globus sensation (upright 90 degrees for meals, sit up 30-60 mins after meals, follow solids/liquids). SLP Short Term Goal 2 (Week 1): Patient will utilize speech intelligibility strategies at the sentence level to maximize intelligibility and decrease shortness of breath with overall Min verbal cues. SLP Short Term Goal 3 (Week 1): Patient will demosntrate complex problem solving for functional tasks with supervision verbal cues. SLP Short Term Goal 4 (Week 1): Patient will self-monitor and correct erorrs during complex tasks with supervision verbal cues. SLP Short Term Goal 5 (Week 1): Patient will utilize memory compensatory strategies to recall functional information with Min verbal and visual cues.  Skilled Therapeutic Interventions: Patient agreeable to skilled ST intervention with focus on cognitive goals. Patient discussed conversation she had with Dietitian about diet changes s/p recent   NSTEMI and CVA. SLP facilitated education and problem solving with assessing food labels for ingredients including sodium and sugar. It appears patient may benefit from additional visit with RD due to questions and concerns pertaining to her dietary needs. Notified Corrin Parker, RD, through secure chat. Patient reports she did not need to take any medication at [facility]. Oriented patient to medication chart, wrote down all active medications including 1. Name of medication, 2. Type/dosage, 3. Purpose, 4. Frequency. Patient was able to correctly identify information on chart with 100%  accuracy. Prior to education, she was was only able to accurately identify purpose of 3/10 medications/injections. Plan indicated interest in using a weekly pillbox organizer at discharge. Plan to facilitate sorting medication in pillbox during future sessions. Patient was left in wheelchair with alarm activated and immediate needs within reach at end of session. Continue per current plan of care.      Pain Pain Assessment Pain Scale: 0-10 Pain Score: 0-No pain  Therapy/Group: Individual Therapy  Patty Sermons 10/11/2020, 12:19 PM

## 2020-10-11 NOTE — Progress Notes (Signed)
Nutrition Brief Note  RD contacted via SLP regarding pt with additional questions regarding diet education. Pt unavailable during attempted time of visit. RD to revisit to address pt diet/nutrition questions.   Corrin Parker, MS, RD, LDN RD pager number/after hours weekend pager number on Amion.

## 2020-10-11 NOTE — Progress Notes (Signed)
Physical Therapy Session Note  Patient Details  Name: Tracey Meyer MRN: [id number] Date of Birth: [birth year]  Today's Date: 10/11/2020 PT Individual Time: 2122-4825 PT Individual Time Calculation (min): 48 min   Short Term Goals: Week 1:  PT Short Term Goal 1 (Week 1): Pt will complete least restrictive transfer with min A consistently PT Short Term Goal 2 (Week 1): Pt will ambulate with assist x 1 consistently PT Short Term Goal 3 (Week 1): Pt will initiate stair training  Skilled Therapeutic Interventions/Progress Updates:  Patient supine in bed on entrance to room. Patient alert and agreeable to PT session. Patient denied pain during session.  Therapeutic Activity: Bed Mobility: Patient performed supine <> sit with Min A for performing sidelying technique after difficulty demo'd with direct supine to sit. Physical assist req'd for L LE and for pushing UB from bed surface. Return to supine at end of session req's light Min A for LLE. Min A with vc/ tc for positioning in hooklying to perform bridging for LE and hip positioning as well as for BLE push toward [facility] with Min A.  Transfers: Patient performed STS and SPVT transfers throughout session with Min A overall. Provided vc/ tc for weight shift, L hand placement and grasp to RW hand grip, weight shift, and increasing muscle activation to L knee extension.  Gait Training:  Patient ambulated 20 feet using RHR in hallway with improved knee control. CGA with tc provided to L knee to maintain knee flex/ ext positioning within small range. Demonstrated improved Lle advancement and placement throughout. Provided vc/ tc for increasing L knee/ hip  flexion in swing through, L foot placement, upright posture, hand placement, weight shift.  Neuromuscular Re-ed: NMR facilitated during session with focus on forward weight shifting, standing balance, dynamic sitting balance, motor control. Pt guided in L toe tap to 2" step requiring vc/ tc for  increasing knee/ hip flexion in order to clear toe onto and off of step without drag of foot from step. Improves with consistent vc. VC/ tc for LLE muscle activation for small ROM knee flex/ ext to improve quad control and strength. Pt guided in sitting and standing balance with reaching activity with pt able to unweight from seat in order to grab cones at differing heights and distances, and then to place cones on top of colored disc on tray table at distance far outside of BOS. Performed with CGA/ intermittent Min A to maintain balance and block L knee to prevent buckle. NMR performed for improvements in motor control and coordination, balance, sequencing, judgement, and self confidence/ efficacy in performing all aspects of mobility at highest level of independence.   Patient supine  in bed at end of session with brakes locked, bed alarm set, and all needs within reach.     Therapy Documentation Precautions:  Precautions Precautions: Fall Precaution Comments: L hemiparesis Restrictions Weight Bearing Restrictions: No  Therapy/Group: Individual Therapy  Alger Simons PT, DPT 10/11/2020, 6:34 PM

## 2020-10-11 NOTE — IPOC Note (Signed)
Overall Plan of Care [facility]) Patient Details Name: Tracey Meyer MRN: [id number] DOB: [birth year]  Admitting Diagnosis: Embolic stroke [facility])  Hospital Problems: Principal Problem:   Embolic stroke [facility])     Functional Problem List: Nursing Endurance, Medication Management, Safety, Bowel  PT Balance, Endurance, Motor, Safety, Sensory  OT Balance, Cognition, Endurance, Motor, Pain, Sensory, Safety  SLP Cognition, Nutrition, Motor  TR         Basic ADL's: OT Eating, Grooming, Bathing, Dressing, Toileting     Advanced  ADL's: OT Simple Meal Preparation     Transfers: PT Bed Mobility, Bed to Chair, Car, Furniture, Floor  OT Toilet, Metallurgist: PT Ambulation, Emergency planning/management officer, Stairs     Additional Impairments: OT Fuctional Use of Upper Extremity  SLP Swallowing, Communication, Social Cognition expression Problem Solving, Memory, Awareness  TR      Anticipated Outcomes Item Anticipated Outcome  Self Feeding modified independent  Swallowing  Mod I   Basic self-care  min guard to supervision  Toileting  min guard   Bathroom Transfers min guard  Bowel/Bladder  manage bowel with mod I assist  Transfers  Supervision  Locomotion  Supervision with LRAD  Communication  Mod I  Cognition  Mod I  Pain     Safety/Judgment  maintain safety with cues/reminders   Therapy Plan: PT Intensity: Minimum of 1-2 x/day ,45 to 90 minutes PT Frequency: 5 out of 7 days PT Duration Estimated Length of Stay: 14-16 days OT Intensity: Minimum of 1-2 x/day, 45 to 90 minutes OT Frequency: 5 out of 7 days OT Duration/Estimated Length of Stay: 17-21 days SLP Intensity: Minumum of 1-2 x/day, 30 to 90 minutes SLP Frequency: 3 to 5 out of 7 days SLP Duration/Estimated Length of Stay: 14-16 days   Due to the current state of emergency, patients may not be receiving their 3-hours of Medicare-mandated therapy.   Team Interventions: Nursing Interventions Bowel  Management, Patient/Family Education, Medication Management, Disease Management/Prevention, Discharge Planning  PT interventions Ambulation/gait training, Balance/vestibular training, Cognitive remediation/compensation, Community reintegration, Discharge planning, Disease management/prevention, DME/adaptive equipment instruction, Functional electrical stimulation, Functional mobility training, Neuromuscular re-education, Pain management, Patient/family education, Psychosocial support, Splinting/orthotics, Stair training, Therapeutic Activities, Therapeutic Exercise, UE/LE Strength taining/ROM, UE/LE Coordination activities, Wheelchair propulsion/positioning  OT Interventions Training and development officer, Cognitive remediation/compensation, Academic librarian, Engineer, drilling, Disease mangement/prevention, Discharge planning, Functional electrical stimulation, Functional mobility training, Neuromuscular re-education, Psychosocial support, Patient/family education, Pain management, Self Care/advanced ADL retraining, Splinting/orthotics, UE/LE Strength taining/ROM, Therapeutic Exercise, UE/LE Coordination activities, Therapeutic Activities, Wheelchair propulsion/positioning  SLP Interventions Cognitive remediation/compensation, Dysphagia/aspiration precaution training, Internal/external aids, Speech/Language facilitation, Therapeutic Activities, Environmental controls, Cueing hierarchy, Functional tasks, Patient/family education  TR Interventions    SW/CM Interventions Discharge Planning, Psychosocial Support, Patient/Family Education   Barriers to Discharge MD  Medical stability  Nursing Decreased caregiver support, Home environment access/layout, Medication compliance, Insurance for SNF coverage, Other (comments) (insurance for follow up services) 1 level mobile home 3 ste w significant other  PT Home environment access/layout    OT Decreased caregiver support Spouse cannot  provide physical assist.  [place] for SNF coverage, Decreased caregiver support     Team Discharge Planning: Destination: PT-Home ,OT- Home , SLP-Home Projected Follow-up: PT-Home health PT, 24 hour supervision/assistance, OT-  Home health OT, 24 hour supervision/assistance, SLP- (TBD) Projected Equipment Needs: PT-To be determined, OT- To be determined, SLP-None recommended by SLP Equipment Details: PT-TBD pending progress, OT-  Patient/family involved in  discharge planning: PT- Patient,  OT- , SLP-Patient  MD ELOS: 14-17d Medical Rehab Prognosis:  Good Assessment:  60 year old female with history of HTN, dyslipidemia, pre-diabetes but lack of medical care for years who was evaluated at [facility] on 09/22/20 for upper abdominal pain, N/V, diarrhea and diaphoresis. 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 This revealed 100% occlusion of mild LAD which was treated with PCI of mid LAD with DES X 1 by Dr. Ellyn Hack.  She was discharged to home on 07/04 but readmitted on 07/07 with reports of left sided weakness and slurred speech that began evening of discharge and persisted.  She also had issues with increasing SOB and orthopnea with decreasing urine output and presented to the ED for work-up.  MRI brain done showing multiple scattered acute to early subacute infarcts involving bilateral cerebral and cerebellar hemisphere with largest area of infarct in right occipital cortex and felt to be central thromboembolic etiology.  MRA showed moderate stenosis of distal left V4 segment, distal basilar artery and cavernous right ICA and occlusion of right V4 segment beyond takeoff of right PICA and 70% plaque L-ICA changes.       Dr. Carlis Abbott consulted for input and felt that left ICA not because of a stroke and to follow-up in 3 months for repeat carotid duplex. TEE for work-up and showed moderate severe LV  dysfunction and no obvious apical thrombus, small pericardial effusion and EF of 30 to 35%.  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 fib after discharge.  She continues on DAPT due to DES and medications adjusted to manage chronic combined CHF with SOB.  Dr. Erlinda Hong recommends long-term BP goal 130-150 given significant posterior circulation vascular stenosis.  She continues to be limited by left hemiplegia affecting ADLs and mobility. CIR recommended due to functional decline.       See Team Conference Notes for weekly updates to the plan of care

## 2020-10-11 NOTE — Progress Notes (Addendum)
Occupational Therapy Session Note  Patient Details  Name: Tracey Meyer MRN: [id number] Date of Birth: September 06, 1960  Today's Date: 10/11/2020 OT Individual Time: 3559-7416 OT Individual Time Calculation (min): 73 min    Short Term Goals: Week 1:  OT Short Term Goal 1 (Week 1): Pt will complete UB dressing with supervision for pullover shirt. OT Short Term Goal 2 (Week 1): Pt will complete LB bathing sit to stand with min assist. OT Short Term Goal 3 (Week 1): Pt will complete LB dressing with min assist sit to stand for underpants and pants only. OT Short Term Goal 4 (Week 1): Pt will complete stand pivot transfer to the 3:1 with min assist. OT Short Term Goal 5 (Week 1): Pt will use the LUE as a gross assist with supervision during bathing and grooming tasks.  Skilled Therapeutic Interventions/Progress Updates:    Pt finishing toileting to start session with nursing assisting using the [facility].  She was then taken down to the therapy gym via wheelchair for session with mod assist for stand pivot transfer to the therapy mat.  Mod assist for transition from sitting to sidelying on the left and to supine.  Worked on shoulder stretches to internal rotators and extensors as well elbow flexors before working on [employer].  Had her hold onto a hoola hoop with BUEs and ace bandage used to sustain grip on the left side.  Worked on movements of shoulder flexion with mod assist to maintain symmetrical positioning between the left and right with each movement.  Decreased ability to maintain elbow extension through the movements as well.  Transitioned back to sitting with use of the UE Ranger for activation of elbow extensors, shoulder flexors, and external rotators to push the left hand to target.  She needed mod assist to complete these small movements.  Next, NMES was applied to the left digit extensors for 12 mins on level 9 with Saebo Stim One.  See below for parameters.  She worked on activation of the digit  extensors along with stimulation and then she worked on Building surveyor with therapist helping to close the hand when it relaxed.  Finished with work on functional reaching to pick up foam blocks 4" X 2".  Mod assist to close hand with therapist  and place block in container at knee level.  Returned to the chair with mod assist and then for transfer to the bed at the same level.  Min assist for transfer to supine with pt left in the bed with call button and phone in reach.   Saebo Stim One 330 pulse width 35 Hz pulse rate On 8 sec/ off 8 sec Ramp up/ down 2 sec Symmetrical Biphasic wave form  Max intensity 153mA at 500 Ohm load   Therapy Documentation Precautions:  Precautions Precautions: Fall Precaution Comments: L hemiparesis Restrictions Weight Bearing Restrictions: No  Pain: Pain Assessment Pain Scale: Faces Pain Score: 0-No pain ADL: See Care Tool Section for some details of mobility and selfcare  Therapy/Group: Individual Therapy  Danashia Landers OTR/L 10/11/2020, 3:34 PM

## 2020-10-11 NOTE — Progress Notes (Signed)
Physical Therapy Session Note  Patient Details  Name: Tracey Meyer MRN: [id number] Date of Birth: 11-06-1960  Today's Date: 10/11/2020 PT Individual Time: 0900-0930 PT Individual Time Calculation (min): 30 min   Short Term Goals: Week 1:  PT Short Term Goal 1 (Week 1): Pt will complete least restrictive transfer with min A consistently PT Short Term Goal 2 (Week 1): Pt will ambulate with assist x 1 consistently PT Short Term Goal 3 (Week 1): Pt will initiate stair training  Skilled Therapeutic Interventions/Progress Updates:     Pt received sitting in w/c to start session, agreeable to PT tx. RN at bedside adminstering morning medications. Pt denies any pain but reports buttock discomfort from sitting in w/c. Pt with standard w/c cushion and this was switched out for a Roho cushion to optimize skin protection, sitting posture, and pressure distribution. Pt was wheeled to main rehab gym for time management. Completed stand<>pivot transfer with min/modA from w/c to mat table with cues for sequencing and technique. Worked on repeated sit<>stands with L knee block and minA - L leg tends to push forwards and lacks knee/hip flexors to prevent this. Next, worked on [employer] with vs without UE support - Without UE support, requires modA for balance due to R and posterior lean. With RUE support, requires minA for balance. Worked on forward/backward stepping with RLE with L knee block, facilitating L stance control. Also instructed on LLE knee raises in standing with AAROM for endrange to faciliate hip/knee flexors. Instructed on knee slides with pillow case while seated EOM, again required AAROM for completion due to lack of hamstring activation. Stand<>pivot with minA back to her w/c and wheeled back to her room. Ended session seated in w/c with 1/2 lap tray supporting LUE and safety belt alarm in place.   Therapy Documentation Precautions:  Precautions Precautions: Fall Precaution  Comments: L hemiparesis Restrictions Weight Bearing Restrictions: No General:    Therapy/Group: Individual Therapy  Alger Simons 10/11/2020, 6:35 AM

## 2020-10-11 NOTE — Progress Notes (Signed)
PROGRESS NOTE   Subjective/Complaints: Feels better today, trying to move the left upper extremity more.  Was tired after therapy yesterday.  Able to tolerate however  ROS- Neg CP, SOB, N/V/D   Objective:   No results found. Recent Labs    10/10/20 0510  WBC 6.8  HGB 11.2*  HCT 34.6*  PLT 412*    Recent Labs    10/10/20 0510  NA 136  K 4.1  CL 108  CO2 23  GLUCOSE 137*  BUN 15  CREATININE 1.17*  CALCIUM 9.3     Intake/Output Summary (Last 24 hours) at 10/11/2020 0906 Last data filed at 10/11/2020 0730 Gross per 24 hour  Intake 600 ml  Output --  Net 600 ml         Physical Exam: Vital Signs Blood pressure 124/63, pulse 74, temperature 98.6 F (37 C), temperature source Oral, resp. rate 18, height 5\' 4"  (1.626 m), weight 71.4 kg, SpO2 97 %.   General: No acute distress Mood and affect are appropriate Heart: Regular rate and rhythm no rubs murmurs or extra sounds Lungs: Clear to auscultation, breathing unlabored, no rales or wheezes Abdomen: Positive bowel sounds, soft nontender to palpation, nondistended Extremities: No clubbing, cyanosis, or edema Skin: No evidence of breakdown, no evidence of rash Neurologic: Cranial nerves II through XII intact, motor strength is 5/5 in right deltoid, bicep, tricep, grip, hip flexor, knee extensors, ankle dorsiflexor and plantar flexor 2/5 Left deltoid, 3 - biceps, 2 - triceps , 0/5 grip, 2- HF, KE, 0/5 ADF Sensory exam normal sensation to light touch and proprioception in bilateral upper and lower extremities  Musculoskeletal: No pain with range of motion in all 4 extremities. No joint swelling   Assessment/Plan: 1. Functional deficits which require 3+ hours per day of interdisciplinary therapy in a comprehensive inpatient rehab setting. Physiatrist is providing close team supervision and 24 hour management of active medical problems listed below. Physiatrist  and rehab team continue to assess barriers to discharge/monitor patient progress toward functional and medical goals  Care Tool:  Bathing    Body parts bathed by patient: Chest, Abdomen, Left arm, Right upper leg, Left upper leg, Face, Right lower leg   Body parts bathed by helper: Front perineal area, Buttocks, Left lower leg     Bathing assist Assist Level: Moderate Assistance - Patient 50 - 74%     Upper Body Dressing/Undressing Upper body dressing   What is the patient wearing?: Pull over shirt    Upper body assist Assist Level: Minimal Assistance - Patient > 75%    Lower Body Dressing/Undressing Lower body dressing      What is the patient wearing?: Pants     Lower body assist Assist for lower body dressing: Maximal Assistance - Patient 25 - 49%     Toileting Toileting    Toileting assist Assist for toileting: Maximal Assistance - Patient 25 - 49%     Transfers Chair/bed transfer  Transfers assist     Chair/bed transfer assist level: Maximal Assistance - Patient 25 - 49% (stand pivot)     Locomotion Ambulation   Ambulation assist      Assist level: 2  helpers Assistive device: Other (comment) (R handrail in hallway) Max distance: 30'   Walk 10 feet activity   Assist     Assist level: 2 helpers Assistive device: Other (comment) (rail in hallway)   Walk 50 feet activity   Assist Walk 50 feet with 2 turns activity did not occur: Safety/medical concerns         Walk 150 feet activity   Assist Walk 150 feet activity did not occur: Safety/medical concerns         Walk 10 feet on uneven surface  activity   Assist Walk 10 feet on uneven surfaces activity did not occur: Safety/medical concerns         Wheelchair     Assist Will patient use wheelchair at discharge?:  (TBD)      Wheelchair assist level: Dependent - Patient 0% Max wheelchair distance: 150'    Wheelchair 50 feet with 2 turns activity    Assist         Assist Level: Dependent - Patient 0%   Wheelchair 150 feet activity     Assist      Assist Level: Dependent - Patient 0%   Blood pressure 124/63, pulse 74, temperature 98.6 F (37 C), temperature source Oral, resp. rate 18, height 5\' 4"  (1.626 m), weight 71.4 kg, SpO2 97 %.  Medical Problem List and Plan: 1.  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  Antithrombotics: -DVT/anticoagulation:  Pharmaceutical: Lovenox             -antiplatelet therapy: ASA/Brillinta 3. Pain Management: Tylenol prn.  4. Mood: LCSW to follow for evaluation and support.              -antipsychotic agents: N/A 5. Neuropsych: This patient is capable of making decisions on her own behalf. 6. Skin/Wound Care: Routine pressure relief measures.  7. Fluids/Electrolytes/Nutrition: Monitor I/O.Heart healthy diet. 8.  NSTEMI s/p PCI/DES: On ASA/Brillinta 9. Chronic combined CHF: Monitor for signs of overload. Check weights daily             --On Entresto, Coreg, Spironolactone and Crestor.             --SBP goal 130-150 to prevent cerebral hypoperfusion. Vitals:   10/10/20 1941 10/11/20 0554  BP: 140/77 124/63  Pulse: 72 74  Resp: 18 18  Temp: 98.3 F (36.8 C) 98.6 F (37 C)  SpO2: 100% 97%  Controlled this a.m. although little bit lower than goal.  Continue to monitor 10. T2DM: Hgb A1C-6.9. Monitor BS ac/hs. Educate on CM diet.Newly diagnosed CBG (last 3)  Recent Labs    10/10/20 1713 10/10/20 2057 10/11/20 0626  GLUCAP 148* 114* 129*   Controlled 7/21  -- Continue metformin bid (recently added)  11. Anemia: Likely due to DAPT/Lovenox. Reported to have some pink vaginal discharge--vaginally v/s bladder source?             --monitor for signs of bleeding. Monitor serial H/H. 12. Constipation: No BM for 2 weeks. Will add Senna and warm prune juice. Does not want to try anything  stronger for now.      LOS: 2 days A FACE TO FACE EVALUATION WAS PERFORMED  Charlett Blake 10/11/2020, 9:06 AM

## 2020-10-12 LAB — GLUCOSE, CAPILLARY
Glucose-Capillary: 132 mg/dL — ABNORMAL HIGH (ref 70–99)
Glucose-Capillary: 139 mg/dL — ABNORMAL HIGH (ref 70–99)
Glucose-Capillary: 132 mg/dL — ABNORMAL HIGH (ref 70–99)
Glucose-Capillary: 113 mg/dL — ABNORMAL HIGH (ref 70–99)

## 2020-10-12 MED ORDER — METOPROLOL SUCCINATE ER 25 MG PO TB24
12.5000 mg | ORAL_TABLET | Freq: Every day | ORAL | Status: DC
  Administered 2020-10-13 – 2020-11-05 (×24): 12.5 mg via ORAL
  Filled 2020-10-12 (×24): qty 1

## 2020-10-12 NOTE — Progress Notes (Signed)
Physical Therapy Session Note  Patient Details  Name: Tracey Meyer MRN: [id number] Date of Birth: Sep 20, 1960  Today's Date: 10/12/2020 PT Individual Time: 1102-1158 PT Individual Time Calculation (min): 56 min   Short Term Goals: Week 1:  PT Short Term Goal 1 (Week 1): Pt will complete least restrictive transfer with min A consistently PT Short Term Goal 2 (Week 1): Pt will ambulate with assist x 1 consistently PT Short Term Goal 3 (Week 1): Pt will initiate stair training  Skilled Therapeutic Interventions/Progress Updates:    Patient in w/c and reports sore buttocks due to sitting up since 0830.  Patient assisted in w/c to therapy gym.  Performed squat pivot to mat with min A after w/c set up.  Patient sit to stand x 10 with R foot on 4" step and mod facilitation for L lateral weight shift. Patient performed standing partial sits bending at hips only then cue to pull hips toward chair for hip extension in standing.  During seated rest performed L UE AAROM at fingers and wrist.  Placed hand splint on L side of RW and pt sit to stand to RW with min A and ambulated mod A for L foot progression, lateral weight shifts and walker management  x 15'.  Seated for L foot/ankle PROM and to attempt to engage musculature during ROM with cues and tapping.  Wrapped L foot with ACE for DF assist.  Patient ambulated x 17' with RW and mod A again for L hamstring activation to initiate swing and cues for forward gaze and posture.  Patient ambulated another 78' with mod A.  Standing for L knee extension with orange t-band behind knee x 2 x 10 with A also for hip extension and L lateral weight shift.  Patient needing cues for pushing up from chair for safety throughout session.  In parallel bars pt performed forward step up onto 6" step with mod A and cues/demonstration for sequence.  Patient performed x 2, then stated to tired to continue.  Assisted in w/c to room.  Sit to stand and step to bed using bed rail and  min A.  Sit to supine min A for L LE.  Patient left with L arm elevated, call bell and table in reach and bed alarm active.  Therapy Documentation Precautions:  Precautions Precautions: Fall Precaution Comments: L hemiparesis Restrictions Weight Bearing Restrictions: No  Pain: Pain Assessment Faces Pain Scale: Hurts little more Pain Type: Acute pain Pain Location: Buttocks Pain Descriptors / Indicators: Sore Pain Onset: Progressive    Therapy/Group: Individual Therapy  Reginia Naas Magda Kiel, PT 10/12/2020, 8:58 AM

## 2020-10-12 NOTE — Progress Notes (Signed)
Nutrition Education Note  Pt with questions regarding foods recommended and not recommended on a heart healthy diet. Handout "Heart Healthy Consistency Carbohydrate Nutrition Therapy" from the Academy of Nutrition and Dietetics Manual was given. List of foods recommended not not recommended discussed. Encouraged monitoring sodium intake at meals. Pt agreeable to incorporating more salt free seasonings such as Mrs Deliah Boston. Discussed fruits and vegetables at meals and limiting fried foods. Pt reports understanding of information discussed. No further nutrition interventions at this time.   Corrin Parker, MS, RD, LDN RD pager number/after hours weekend pager number on Am[employer].

## 2020-10-12 NOTE — Progress Notes (Signed)
Speech Language Pathology Daily Session Note  Patient Details  Name: Tracey Meyer MRN: [id number] Date of Birth: [birth year]  Today's Date: 10/12/2020 SLP Individual Time: [id number] SLP Individual Time Calculation (min): 30 min  Short Term Goals: Week 1: SLP Short Term Goal 1 (Week 1): Patient will consume current diet without overt s/s of aspiration with Mod I for use of swallowing compensatory strategies to decrease globus sensation (upright 90 degrees for meals, sit up 30-60 mins after meals, follow solids/liquids). SLP Short Term Goal 2 (Week 1): Patient will utilize speech intelligibility strategies at the sentence level to maximize intelligibility and decrease shortness of breath with overall Min verbal cues. SLP Short Term Goal 3 (Week 1): Patient will demosntrate complex problem solving for functional tasks with supervision verbal cues. SLP Short Term Goal 4 (Week 1): Patient will self-monitor and correct erorrs during complex tasks with supervision verbal cues. SLP Short Term Goal 5 (Week 1): Patient will utilize memory compensatory strategies to recall functional information with Min verbal and visual cues.  Skilled Therapeutic Interventions: Patient agreeable to skilled ST intervention with focus on cognitive goals. SLP facilitated attention, organizing, and problem solving with medication organization in am/pm pillbox. Patient organized 3 medications (i.e., take one pill once daily, take one pill twice daily, and take two pills once daily) with Mod A progressing to Min A verbal and visual cues for processing and error awareness. Error identified x1 in which patient was able to repair with sup A verbal cues. She required min A verbal cues for comprehension of medication labels and mod A verbal cues for answering hypothetical scenarios (e.g., "if a medication states to take twice daily, would you be able to take them both at the same time of day?"). Patient was left in bed with alarm  activated and immediate needs within reach at end of session. Continue per current plan of care.      Pain Pain Assessment Pain Scale: 0-10 Pain Score: 0-No pain Faces Pain Scale: Hurts little more Pain Type: Acute pain Pain Location: Buttocks Pain Descriptors / Indicators: Sore Pain Onset: Progressive  Therapy/Group: Individual Therapy  Patty Sermons 10/12/2020, 4:26 PM

## 2020-10-12 NOTE — Progress Notes (Addendum)
Occupational Therapy Session Note  Patient Details  Name: Tracey Meyer MRN: [id number] Date of Birth: 1960/10/10  Today's Date: 10/12/2020 OT Individual Time: [id number] OT Individual Time Calculation (min): 57 min    Short Term Goals: Week 1:  OT Short Term Goal 1 (Week 1): Pt will complete UB dressing with supervision for pullover shirt. OT Short Term Goal 2 (Week 1): Pt will complete LB bathing sit to stand with min assist. OT Short Term Goal 3 (Week 1): Pt will complete LB dressing with min assist sit to stand for underpants and pants only. OT Short Term Goal 4 (Week 1): Pt will complete stand pivot transfer to the 3:1 with min assist. OT Short Term Goal 5 (Week 1): Pt will use the LUE as a gross assist with supervision during bathing and grooming tasks.  Skilled Therapeutic Interventions/Progress Updates:    Session 1: [id number])  Pt completed shower and dressing during session.  She was able to transfer to the EOB with min assist and then completed functional mobility to the shower bench with max assist.  Decreased ability to advance the LLE with increased trunk and knee flexion noted when attempting weightbearing.  She was able to complete bathing sit to stand with mod assist.  Mod facilitation needed for integration of the LUE to wash the right arm.  She was able to complete sit to stand in the shower at min assist level with use of the grab bar, but needed mod assist for dynamic standing balance when attempting to wash front and back peri areas.  Transferred stand pivot to the wheelchair with mod assist in order to work on dressing tasks at the sink.  She was able to complete UB dressing with min assist and LB dressing with mod assist following hemi techniques.  Oral hygiene also completed at supervision level from the wheelchair.  Finished session with pt resting in the wheelchair with the safety belt in place and the call button and phone in reach.    Session 2: [id number])  Pt  completed transfer from supine to sit with min assist and then to the wheelchair at mod assist stand pivot.  She was rolled down to the therapy gym and transferred to the therapy mat at mod assist level as well.  She then transitioned to supine at the same level where she worked on rolling side to side for greater activation of the LUE as well as trunk.  She was educated on proper technique for rolling to the right and was able to return demonstrate with mod instructional cueing and overall supervision.  Worked on LUE neuromuscular re-education in supine with use of medium sized therapy ball with left hand ace bandaged  to the ball to assist with keeping it secure.  Worked on shoulder flexion place and hold at 90 degrees.  Completed several reps holding for up to 30 seconds with min facilitation.  Placed NMES to the left digit extensors while in supine as well and in sitting to work on active extension with simulated functional reach.  Stimulation set on level 7 with settings listed below.  She was able to tolerate for 10 mins without an adverse reactions.  Finished session with return to the wheelchair at [facility] assist level and then to the bed once back in the room.  Call button and phone in reach with safety alarm in place.    Therapy Documentation Precautions:  Precautions Precautions: Fall Precaution Comments: L hemiparesis Restrictions Weight Bearing Restrictions: No  Pain: Pain Assessment Pain Scale: Faces Pain Score: 0-No pain ADL: See Care Tool Section for some details of mobility and selfcare   Therapy/Group: Individual Therapy  Tracey Meyer OTR/L  10/12/2020, 12:17 PM

## 2020-10-12 NOTE — Progress Notes (Signed)
PROGRESS NOTE   Subjective/Complaints: Per OT , mod /max with showering   ROS- Neg CP, SOB, N/V/D   Objective:   No results found. Recent Labs    10/10/20 0510  WBC 6.8  HGB 11.2*  HCT 34.6*  PLT 412*    Recent Labs    10/10/20 0510  NA 136  K 4.1  CL 108  CO2 23  GLUCOSE 137*  BUN 15  CREATININE 1.17*  CALCIUM 9.3     Intake/Output Summary (Last 24 hours) at 10/12/2020 [id number] Last data filed at 10/12/2020 0711 Gross per 24 hour  Intake 720 ml  Output --  Net 720 ml         Physical Exam: Vital Signs Blood pressure 129/78, pulse 72, temperature 98.2 F (36.8 C), resp. rate 18, height '5\' 4"'$  (1.626 m), weight 71.4 kg, SpO2 100 %.  General: No acute distress Mood and affect are appropriate Heart: Regular rate and rhythm no rubs murmurs or extra sounds Lungs: Clear to auscultation, breathing unlabored, no rales or wheezes Abdomen: Positive bowel sounds, soft nontender to palpation, nondistended Extremities: No clubbing, cyanosis, or edema Skin: No evidence of breakdown, no evidence of rash  Neurologic: Cranial nerves II through XII intact, motor strength is 5/5 in right deltoid, bicep, tricep, grip, hip flexor, knee extensors, ankle dorsiflexor and plantar flexor 3-/5 Left deltoid, 3 - biceps, 2 - triceps , 0/5 grip, 2- HF, KE, 0/5 ADF Sensory exam normal sensation to light touch and proprioception in bilateral upper and lower extremities  Musculoskeletal: No pain with range of motion in all 4 extremities. No joint swelling   Assessment/Plan: 1. Functional deficits which require 3+ hours per day of interdisciplinary therapy in a comprehensive inpatient rehab setting. Physiatrist is providing close team supervision and 24 hour management of active medical problems listed below. Physiatrist and rehab team continue to assess barriers to discharge/monitor patient progress toward functional and medical  goals  Care Tool:  Bathing    Body parts bathed by patient: Chest, Abdomen, Left arm, Right upper leg, Left upper leg, Face, Right lower leg   Body parts bathed by helper: Front perineal area, Buttocks, Left lower leg     Bathing assist Assist Level: Moderate Assistance - Patient 50 - 74%     Upper Body Dressing/Undressing Upper body dressing   What is the patient wearing?: Pull over shirt    Upper body assist Assist Level: Minimal Assistance - Patient > 75%    Lower Body Dressing/Undressing Lower body dressing      What is the patient wearing?: Pants     Lower body assist Assist for lower body dressing: Maximal Assistance - Patient 25 - 49%     Toileting Toileting    Toileting assist Assist for toileting: Maximal Assistance - Patient 25 - 49%     Transfers Chair/bed transfer  Transfers assist     Chair/bed transfer assist level: Moderate Assistance - Patient 50 - 74%     Locomotion Ambulation   Ambulation assist      Assist level: 2 helpers Assistive device: Other (comment) (R handrail in hallway) Max distance: 30'   Walk 10 feet activity  Assist     Assist level: 2 helpers Assistive device: Other (comment) (rail in hallway)   Walk 50 feet activity   Assist Walk 50 feet with 2 turns activity did not occur: Safety/medical concerns         Walk 150 feet activity   Assist Walk 150 feet activity did not occur: Safety/medical concerns         Walk 10 feet on uneven surface  activity   Assist Walk 10 feet on uneven surfaces activity did not occur: Safety/medical concerns         Wheelchair     Assist Will patient use wheelchair at discharge?:  (TBD)      Wheelchair assist level: Dependent - Patient 0% Max wheelchair distance: 150'    Wheelchair 50 feet with 2 turns activity    Assist        Assist Level: Dependent - Patient 0%   Wheelchair 150 feet activity     Assist      Assist Level:  Dependent - Patient 0%   Blood pressure 129/78, pulse 72, temperature 98.2 F (36.8 C), resp. rate 18, height '5\' 4"'$  (1.626 m), weight 71.4 kg, SpO2 100 %.  Medical Problem List and Plan: 1.  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  Antithrombotics: -DVT/anticoagulation:  Pharmaceutical: Lovenox             -antiplatelet therapy: ASA/Brillinta 3. Pain Management: Tylenol prn.  4. Mood: LCSW to follow for evaluation and support.              -antipsychotic agents: N/A 5. Neuropsych: This patient is capable of making decisions on her own behalf. 6. Skin/Wound Care: Routine pressure relief measures.  7. Fluids/Electrolytes/Nutrition: Monitor I/O.Heart healthy diet. 8.  NSTEMI s/p PCI/DES: On ASA/Brillinta 9. Chronic combined CHF: Monitor for signs of overload. Check weights daily             --On Entresto, toprol XL Spironolactone and Crestor.             --SBP goal 130-150 to prevent cerebral hypoperfusion. Vitals:   10/11/20 1944 10/12/20 0556  BP: 135/67 129/78  Pulse: 72 72  Resp: 18 18  Temp: 98.2 F (36.8 C) 98.2 F (36.8 C)  SpO2: 94% 100%  Controlled but goal is higher will reduce toprol XL to 12.'5mg'$  10. T2DM: Hgb A1C-6.9. Monitor BS ac/hs. Educate on CM diet.Newly diagnosed CBG (last 3)  Recent Labs    10/11/20 1634 10/11/20 2056 10/12/20 0556  GLUCAP 120* 171* 132*   Controlled 7/22  -- Continue metformin bid (recently added)  11. Anemia: Likely due to DAPT/Lovenox. Reported to have some pink vaginal discharge--vaginally v/s bladder source?             --monitor for signs of bleeding. Monitor serial H/H. 12. Constipation: No BM for 2 weeks. Will add Senna and warm prune juice. Does not want to try anything stronger for now.      LOS: 3 days A FACE TO FACE EVALUATION WAS PERFORMED  Charlett Blake 10/12/2020, 9:21 AM

## 2020-10-13 LAB — GLUCOSE, CAPILLARY
Glucose-Capillary: 120 mg/dL — ABNORMAL HIGH (ref 70–99)
Glucose-Capillary: 110 mg/dL — ABNORMAL HIGH (ref 70–99)
Glucose-Capillary: 121 mg/dL — ABNORMAL HIGH (ref 70–99)
Glucose-Capillary: 133 mg/dL — ABNORMAL HIGH (ref 70–99)

## 2020-10-13 NOTE — Progress Notes (Signed)
Speech Language Pathology Daily Session Note  Patient Details  Name: Tracey Meyer MRN: [id number] Date of Birth: 1960-05-08  Today's Date: 10/13/2020 SLP Individual Time: [id number] SLP Individual Time Calculation (min): 45 min  Short Term Goals: Week 1: SLP Short Term Goal 1 (Week 1): Patient will consume current diet without overt s/s of aspiration with Mod I for use of swallowing compensatory strategies to decrease globus sensation (upright 90 degrees for meals, sit up 30-60 mins after meals, follow solids/liquids). SLP Short Term Goal 2 (Week 1): Patient will utilize speech intelligibility strategies at the sentence level to maximize intelligibility and decrease shortness of breath with overall Min verbal cues. SLP Short Term Goal 3 (Week 1): Patient will demosntrate complex problem solving for functional tasks with supervision verbal cues. SLP Short Term Goal 4 (Week 1): Patient will self-monitor and correct erorrs during complex tasks with supervision verbal cues. SLP Short Term Goal 5 (Week 1): Patient will utilize memory compensatory strategies to recall functional information with Min verbal and visual cues.  Skilled Therapeutic Interventions: Patient agreeable to skilled ST intervention with focus on cognitive goals. Facilitated problem solving, reasoning, and alternating attention with novel card game "Blink" with sup A verbal cues for processing and reminders or task instructions. Patient benefited from visual containing instructions in which she referred to with sup A verbal cues. Facilitated verbal reasoning with [place] game in which patient completed with mod I. Patient was transferred to bed with sup A verbal cues for safe transfer techniques. She was left in bed with alarm activated and immediate needs within reach at end of session. Continue per current plan of care.      Pain Pain Assessment Pain Scale: 0-10 Pain Score: 0-No pain  Therapy/Group: Individual  Therapy  Patty Sermons 10/13/2020, 9:35 AM

## 2020-10-14 LAB — GLUCOSE, CAPILLARY
Glucose-Capillary: 114 mg/dL — ABNORMAL HIGH (ref 70–99)
Glucose-Capillary: 123 mg/dL — ABNORMAL HIGH (ref 70–99)
Glucose-Capillary: 113 mg/dL — ABNORMAL HIGH (ref 70–99)
Glucose-Capillary: 109 mg/dL — ABNORMAL HIGH (ref 70–99)

## 2020-10-14 NOTE — Progress Notes (Signed)
Physical Therapy Session Note  Patient Details  Name: Tracey Meyer MRN: [id number] Date of Birth: 05/04/1960  Today's Date: 10/14/2020 PT Individual Time: 1300-1405 PT Individual Time Calculation (min): 65 min   Short Term Goals: Week 1:  PT Short Term Goal 1 (Week 1): Pt will complete least restrictive transfer with min A consistently PT Short Term Goal 2 (Week 1): Pt will ambulate with assist x 1 consistently PT Short Term Goal 3 (Week 1): Pt will initiate stair training  Skilled Therapeutic Interventions/Progress Updates:     Patient in bed upon PT arrival. Patient alert and agreeable to PT session. Patient denied pain during session.  Therapeutic Activity: Bed Mobility: Patient performed supine to/from sit with supervision in a flat bed without use of bed rails. Provided verbal cues for rolling her opposite shoulder forward to bring her trunk up and brining knees to chest bring her legs off/on the bed. Transfers: Patient performed stand pivot bed<>w/c with mod A to the L and min A to the R. She performed sit to/from stand x3 using the RW and x2 with the Light gait handles with min A progressing to CGA. Provided verbal cues for hand placement, forward weight shift, and reaching back to sit for controlled descent.  Gait Training:  Patient ambulated 30 feet using RW with mod A and supervision progressing to mod A for L limb advancement. Placed Lite Gait sling in standing with CGA for balance with B upper extremity support. Patient ambulated 52 feet and 104 feet in the Light Gait over ground focused on L limb advancement and reciprocal gait pattern. Ambulated with extensor synergistic pattern on L leading to poor foot clearance, adduction with foot placement, and intermittent L extensor thrust. Provided multimodal cues for L hamstring activation, reciprocal gait pattern, leading with L heel at initial contact, and erect posture and as patient tends to flex her trunk forward and to the R  with fatigue. Patient attempted another trial with the RW following Lite Gait, however, due to fatigue, she was unable to advance her R leg and requested to sit down.   Patient in bed due to fatigue at end of session with breaks locked, bed alarm set, and all needs within reach.   Therapy Documentation Precautions:  Precautions Precautions: Fall Precaution Comments: L hemiparesis Restrictions Weight Bearing Restrictions: No   Therapy/Group: Individual Therapy  Latonga Ponder L Julieann Drummonds PT, DPT  10/14/2020, 4:43 PM

## 2020-10-14 NOTE — Progress Notes (Signed)
Occupational Therapy Session Note  Patient Details  Name: Tracey Meyer MRN: [id number] Date of Birth: 09-24-1960  Today's Date: 10/14/2020 OT Individual Time: 6468-0321 OT Individual Time Calculation (min): 42 min    Short Term Goals: Week 1:  OT Short Term Goal 1 (Week 1): Pt will complete UB dressing with supervision for pullover shirt. OT Short Term Goal 2 (Week 1): Pt will complete LB bathing sit to stand with min assist. OT Short Term Goal 3 (Week 1): Pt will complete LB dressing with min assist sit to stand for underpants and pants only. OT Short Term Goal 4 (Week 1): Pt will complete stand pivot transfer to the 3:1 with min assist. OT Short Term Goal 5 (Week 1): Pt will use the LUE as a gross assist with supervision during bathing and grooming tasks.  Skilled Therapeutic Interventions/Progress Updates:    Pt received seated in w/c, agreeable to OT, reporting no pain this session, sister-in-law present but leaving at beginning of session. Session focused on LUE NMR, dynamic sitting balance, sit<>stands, and functional transfers in preparation for improved independence in BADLs and functional mobility as well as improved functional use of LUE. Stand step transfers w/c<>therapy mat and w/c<>bed CGA-min A for balance due to LLE weakness/motor planning deficits. Sitting on therapy mat, pt engaged in LUE NMR activities including moving weighted ball in all directions with min facilitatory tactile cues, isometric exercises for shoulder, elbow, and wrist, and education on positioning fingers/wrist in EXT to decrease risk of contractures. Pt engaged in sit<>stand practice and dynamic sitting/standing balance tasks during connect 4 game reaching outside BOS. Sit<>stand requiring CGA for balance with no UE support, targeting core strengthening and balance. Pt assisted cleaning game pieces with BUEs, requiring max A to don surgical gloves but close spvsn during cleaning using LUE as functional  assist in holding items. Upon return to room, pt requested to return to bed. Scooting HOB close spvsn with use of bed rails. Pt remained supine, alarm set, call bell in reach, and all immediate needs met.    Therapy Documentation Precautions:  Precautions Precautions: Fall Precaution Comments: L hemiparesis Restrictions Weight Bearing Restrictions: No  Pain: Pain Assessment Pain Scale: 0-10 Pain Score: 0-No pain   Therapy/Group: Individual Therapy  Mellissa Kohut 10/14/2020, 7:57 AM

## 2020-10-14 NOTE — Progress Notes (Signed)
PROGRESS NOTE   Subjective/Complaints:  No issues overnite   ROS- Neg CP, SOB, N/V/D   Objective:   No results found. No results for input(s): WBC, HGB, HCT, PLT in the last 72 hours.  No results for input(s): NA, K, CL, CO2, GLUCOSE, BUN, CREATININE, CALCIUM in the last 72 hours.   Intake/Output Summary (Last 24 hours) at 10/14/2020 0854 Last data filed at 10/14/2020 0730 Gross per 24 hour  Intake 480 ml  Output --  Net 480 ml         Physical Exam: Vital Signs Blood pressure 122/68, pulse 81, temperature 98.3 F (36.8 C), temperature source Oral, resp. rate 19, height '5\' 4"'$  (1.626 m), weight 71.4 kg, SpO2 100 %.  General: No acute distress Mood and affect are appropriate Heart: Regular rate and rhythm no rubs murmurs or extra sounds Lungs: Clear to auscultation, breathing unlabored, no rales or wheezes Abdomen: Positive bowel sounds, soft nontender to palpation, nondistended Extremities: No clubbing, cyanosis, or edema Skin: No evidence of breakdown, no evidence of rash  Neurologic: Cranial nerves II through XII intact, motor strength is 5/5 in right deltoid, bicep, tricep, grip, hip flexor, knee extensors, ankle dorsiflexor and plantar flexor 3-/5 Left deltoid, 3 - biceps, 2 - triceps , 0/5 grip, 2- HF, KE, 0/5 ADF Sensory exam normal sensation to light touch and proprioception in bilateral upper and lower extremities  Musculoskeletal: No pain with range of motion in all 4 extremities. No joint swelling   Assessment/Plan: 1. Functional deficits which require 3+ hours per day of interdisciplinary therapy in a comprehensive inpatient rehab setting. Physiatrist is providing close team supervision and 24 hour management of active medical problems listed below. Physiatrist and rehab team continue to assess barriers to discharge/monitor patient progress toward functional and medical goals  Care Tool:  Bathing     Body parts bathed by patient: Chest, Abdomen, Left arm, Right upper leg, Left upper leg, Face, Right lower leg   Body parts bathed by helper: Front perineal area, Buttocks, Left lower leg, Right arm     Bathing assist Assist Level: Moderate Assistance - Patient 50 - 74%     Upper Body Dressing/Undressing Upper body dressing   What is the patient wearing?: Pull over shirt    Upper body assist Assist Level: Minimal Assistance - Patient > 75%    Lower Body Dressing/Undressing Lower body dressing      What is the patient wearing?: Pants     Lower body assist Assist for lower body dressing: Moderate Assistance - Patient 50 - 74%     Toileting Toileting    Toileting assist Assist for toileting: Maximal Assistance - Patient 25 - 49%     Transfers Chair/bed transfer  Transfers assist     Chair/bed transfer assist level: Moderate Assistance - Patient 50 - 74%     Locomotion Ambulation   Ambulation assist      Assist level: Maximal Assistance - Patient 25 - 49% Assistive device: No Device Max distance: 10'   Walk 10 feet activity   Assist     Assist level: 2 helpers Assistive device: Other (comment) (rail in hallway)   Walk 50  feet activity   Assist Walk 50 feet with 2 turns activity did not occur: Safety/medical concerns         Walk 150 feet activity   Assist Walk 150 feet activity did not occur: Safety/medical concerns         Walk 10 feet on uneven surface  activity   Assist Walk 10 feet on uneven surfaces activity did not occur: Safety/medical concerns         Wheelchair     Assist Will patient use wheelchair at discharge?:  (TBD)      Wheelchair assist level: Dependent - Patient 0% Max wheelchair distance: 150'    Wheelchair 50 feet with 2 turns activity    Assist        Assist Level: Dependent - Patient 0%   Wheelchair 150 feet activity     Assist      Assist Level: Dependent - Patient 0%    Blood pressure 122/68, pulse 81, temperature 98.3 F (36.8 C), temperature source Oral, resp. rate 19, height '5\' 4"'$  (1.626 m), weight 71.4 kg, SpO2 100 %.  Medical Problem List and Plan: 1.  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  Antithrombotics: -DVT/anticoagulation:  Pharmaceutical: Lovenox             -antiplatelet therapy: ASA/Brillinta 3. Pain Management: Tylenol prn.  4. Mood: LCSW to follow for evaluation and support.              -antipsychotic agents: N/A 5. Neuropsych: This patient is capable of making decisions on her own behalf. 6. Skin/Wound Care: Routine pressure relief measures.  7. Fluids/Electrolytes/Nutrition: Monitor I/O.Heart healthy diet. 8.  NSTEMI s/p PCI/DES: On ASA/Brillinta 9. Chronic combined CHF: Monitor for signs of overload. Check weights daily             --On Entresto, toprol XL Spironolactone and Crestor.             --SBP goal 130-150 to prevent cerebral hypoperfusion. Vitals:   10/13/20 1952 10/14/20 0324  BP: 136/85 122/68  Pulse: 79 81  Resp: 16 19  Temp: 98.1 F (36.7 C) 98.3 F (36.8 C)  SpO2: 100% 100%  Controlled but goal is higher will reduce toprol XL to 12.'5mg'$  on 7/23 monitor  10. T2DM: Hgb A1C-6.9. Monitor BS ac/hs. Educate on CM diet.Newly diagnosed CBG (last 3)  Recent Labs    10/13/20 1646 10/13/20 2119 10/14/20 0611  GLUCAP 121* 133* 114*   Controlled 7/24  -- Continue metformin bid (recently added)  11. Anemia: Likely due to DAPT/Lovenox. Reported to have some pink vaginal discharge--vaginally v/s bladder source?             --monitor for signs of bleeding. Monitor serial H/H. 12. Constipation: No BM for 2 weeks. Will add Senna and warm prune juice. Does not want to try anything stronger for now.      LOS: 5 days A FACE TO FACE EVALUATION WAS PERFORMED  Charlett Blake 10/14/2020, 8:54 AM

## 2020-10-14 NOTE — Progress Notes (Signed)
Speech Language Pathology Daily Session Note  Patient Details  Name: Tracey Meyer MRN: [id number] Date of Birth: 09/23/1960  Today's Date: 10/14/2020 SLP Individual Time: [id number] SLP Individual Time Calculation (min): 60 min  Short Term Goals: Week 1: SLP Short Term Goal 1 (Week 1): Patient will consume current diet without overt s/s of aspiration with Mod I for use of swallowing compensatory strategies to decrease globus sensation (upright 90 degrees for meals, sit up 30-60 mins after meals, follow solids/liquids). SLP Short Term Goal 2 (Week 1): Patient will utilize speech intelligibility strategies at the sentence level to maximize intelligibility and decrease shortness of breath with overall Min verbal cues. SLP Short Term Goal 3 (Week 1): Patient will demosntrate complex problem solving for functional tasks with supervision verbal cues. SLP Short Term Goal 4 (Week 1): Patient will self-monitor and correct erorrs during complex tasks with supervision verbal cues. SLP Short Term Goal 5 (Week 1): Patient will utilize memory compensatory strategies to recall functional information with Min verbal and visual cues.  Skilled Therapeutic Interventions:  Pt was seen for skilled ST targeting speech intelligibility goals.  Upon arrival, pt was in bed awake, alert, and agreeable to participating in treatment.  Pt reports that she gets short of breath during meals and with talking but that she manages symptoms with frequent, brief rest breaks.  Pt was fully intelligible during conversations with SLP in a mildly distracting environment (TV on, volume low) with mod I use of intelligibility strategies.  Pt was noted to intermittently become short of breath but this did not impact her intelligibility.  SLP provided skilled education regarding other means of improving speech intelligibility, including modifying the environment to minimize environmental noise and/or distractions.  Pt was left in bed with  bed alarm set and call bell within reach.  Continue per current plan fo care.    Pain Pain Assessment Pain Scale: 0-10 Pain Score: 0-No pain  Therapy/Group: Individual Therapy  Muzammil Bruins, Selinda Orion 10/14/2020, 10:45 AM

## 2020-10-14 NOTE — Progress Notes (Signed)
Occupational Therapy Session Note  Patient Details  Name: Tracey Meyer MRN: [id number] Date of Birth: 1960/07/14  Today's Date: 10/14/2020 OT Individual Time: [id number] OT Individual Time Calculation (min): 27 min    Short Term Goals: Week 1:  OT Short Term Goal 1 (Week 1): Pt will complete UB dressing with supervision for pullover shirt. OT Short Term Goal 2 (Week 1): Pt will complete LB bathing sit to stand with min assist. OT Short Term Goal 3 (Week 1): Pt will complete LB dressing with min assist sit to stand for underpants and pants only. OT Short Term Goal 4 (Week 1): Pt will complete stand pivot transfer to the 3:1 with min assist. OT Short Term Goal 5 (Week 1): Pt will use the LUE as a gross assist with supervision during bathing and grooming tasks.  Skilled Therapeutic Interventions/Progress Updates:    Patient in bed, alert and states that she is fatigued.  She declines to get OOB due to fatigue but is happy to work on left arm AROM - completed supine level facilitation of extensors and inhibition of flexors, light stretch and coordination activities - she is able to actively complete scapular mobility, ER, flexion, abduction, elbow extension, sup/pron, wrist extension and digit flex/ext.  Provided theraputty and reviewed both flex and ext activities.  She remained in bed at close of session, bed alarm set and call bell in hand.    Therapy Documentation Precautions:  Precautions Precautions: Fall Precaution Comments: L hemiparesis Restrictions Weight Bearing Restrictions: No   Therapy/Group: Individual Therapy  Carlos Levering 10/14/2020, 7:43 AM

## 2020-10-15 LAB — GLUCOSE, CAPILLARY
Glucose-Capillary: 113 mg/dL — ABNORMAL HIGH (ref 70–99)
Glucose-Capillary: 107 mg/dL — ABNORMAL HIGH (ref 70–99)
Glucose-Capillary: 185 mg/dL — ABNORMAL HIGH (ref 70–99)
Glucose-Capillary: 108 mg/dL — ABNORMAL HIGH (ref 70–99)

## 2020-10-15 LAB — CBC
HCT: 33.3 % — ABNORMAL LOW (ref 36.0–46.0)
Hemoglobin: 11 g/dL — ABNORMAL LOW (ref 12.0–15.0)
MCH: 30.6 pg (ref 26.0–34.0)
MCHC: 33 g/dL (ref 30.0–36.0)
MCV: 92.8 fL (ref 80.0–100.0)
Platelets: 330 10*3/uL (ref 150–400)
RBC: 3.59 MIL/uL — ABNORMAL LOW (ref 3.87–5.11)
RDW: 12.5 % (ref 11.5–15.5)
WBC: 5.3 10*3/uL (ref 4.0–10.5)
nRBC: 0 % (ref 0.0–0.2)

## 2020-10-15 LAB — BASIC METABOLIC PANEL
Anion gap: 9 (ref 5–15)
BUN: 16 mg/dL (ref 6–20)
CO2: 21 mmol/L — ABNORMAL LOW (ref 22–32)
Calcium: 9.2 mg/dL (ref 8.9–10.3)
Chloride: 105 mmol/L (ref 98–111)
Creatinine, Ser: 1.05 mg/dL — ABNORMAL HIGH (ref 0.44–1.00)
GFR, Estimated: >60 mL/min (ref >60)
Glucose, Bld: 108 mg/dL — ABNORMAL HIGH (ref 70–99)
Potassium: 3.7 mmol/L (ref 3.5–5.1)
Sodium: 135 mmol/L (ref 135–145)

## 2020-10-15 MED ORDER — SENNOSIDES-DOCUSATE SODIUM 8.6-50 MG PO TABS
1.0000 | ORAL_TABLET | Freq: Every day | ORAL | Status: DC
  Administered 2020-10-15 – 2020-11-04 (×13): 1 via ORAL
  Filled 2020-10-15 (×20): qty 1

## 2020-10-15 MED ORDER — POTASSIUM CHLORIDE 20 MEQ PO PACK
20.0000 meq | PACK | Freq: Once | ORAL | Status: AC
  Administered 2020-10-15: 20 meq via ORAL
  Filled 2020-10-15: qty 1

## 2020-10-15 NOTE — Progress Notes (Signed)
Speech Language Pathology Daily Session Note  Patient Details  Name: Tracey Meyer MRN: [id number] Date of Birth: October 22, 1960  Today's Date: 10/15/2020 SLP Individual Time: 1103-1130 SLP Individual Time Calculation (min): 27 min  Short Term Goals: Week 1: SLP Short Term Goal 1 (Week 1): Patient will consume current diet without overt s/s of aspiration with Mod I for use of swallowing compensatory strategies to decrease globus sensation (upright 90 degrees for meals, sit up 30-60 mins after meals, follow solids/liquids). SLP Short Term Goal 2 (Week 1): Patient will utilize speech intelligibility strategies at the sentence level to maximize intelligibility and decrease shortness of breath with overall Min verbal cues. SLP Short Term Goal 3 (Week 1): Patient will demosntrate complex problem solving for functional tasks with supervision verbal cues. SLP Short Term Goal 4 (Week 1): Patient will self-monitor and correct erorrs during complex tasks with supervision verbal cues. SLP Short Term Goal 5 (Week 1): Patient will utilize memory compensatory strategies to recall functional information with Min verbal and visual cues.  Skilled Therapeutic Interventions: Skilled treatment session focused on cognitive goals. SLP facilitated session by providing overall Min A verbal and visual cues for organization and error awareness during a medication management task in which she had to identify administration errors. Patient initially impulsive and began task prior to knowing the directions. She also scanning the pill box without any clear pattern or organization. Once SLP provided patient with clear directions and a marker to help with recall and organization, patient able to complete task with supervision question cues. Patient left upright in wheelchair with alarm on and all needs within reach. Continue with current plan of care.      Pain Pain Assessment Pain Scale: 0-10 Pain Score: 0-No  pain  Therapy/Group: Individual Therapy  Carmella Kees 10/15/2020, 12:32 PM

## 2020-10-15 NOTE — Progress Notes (Signed)
Occupational Therapy Session Note  Patient Details  Name: Tracey Meyer MRN: [id number] Date of Birth: [birth year]  Today's Date: 10/15/2020 OT Individual Time: [id number] OT Individual Time Calculation (min): 58 min    Short Term Goals: Week 1:  OT Short Term Goal 1 (Week 1): Pt will complete UB dressing with supervision for pullover shirt. OT Short Term Goal 2 (Week 1): Pt will complete LB bathing sit to stand with min assist. OT Short Term Goal 3 (Week 1): Pt will complete LB dressing with min assist sit to stand for underpants and pants only. OT Short Term Goal 4 (Week 1): Pt will complete stand pivot transfer to the 3:1 with min assist. OT Short Term Goal 5 (Week 1): Pt will use the LUE as a gross assist with supervision during bathing and grooming tasks.  Skilled Therapeutic Interventions/Progress Updates:    Session 1: [id number])  Pt completed shower and dressing during session.  Mod assist for functional mobility to the bathroom without an assistive device.  Increased trunk flexion noted with scissoring of the LLE when trying to advance it.  Mod assist for sustained activation at the knee with weightbearing.  She was able to remove UB clothing with supervision and min instructional cueing for technique.  She then needed min assist for removal of LB clothing sit to stand.  Min assist for bathing overall sit to stand with max hand over hand assist for using the LUE to wash the right arm.  She was able to transfer back to the wheelchair at [facility] assist level for dressing tasks at the sink.  Min assist for donning pullover shirt with mod assist for donning pants and gripper socks following hemi techniques.  Finished session with completion of oral hygiene at setup level.  She was able to stabilize the deodorant and toothpaste with min assist using the LUE while opening them.  Finished session with pt resting in the wheelchair and with the safety belt and call button in place.    Session 2:  [id number])  Pt completed transfer from the wheelchair to the therapy mat at mod assist level.  She then transitioned to supine at [facility] assist with focus on LUE stretching and neuromuscular re-education.  Therapist completed passive stretching into shoulder flexion and external rotation.  Pt reports some anterior shoulder pain with shoulder flexed to 90 degrees and then placed in external rotation.  Used hula hoop for active motion in the LUE with ace bandaged use to help maintain grip.  She was able to focus on movements of shoulder flexion with elbow extension place and hold at 90 degrees.  Mod assist needed from therapist to maintain elbow extension.  Worked in and out of small movements of shoulder flexion and extension as well with mod assist.  Transitioned back to sitting with NMES applied to the dorsal forearm for facilitation of digit extensors.  Intensity set on level 7 with parameters listed below.  While stimulation was active, had pt work on digit extension as well as pushing her hand up a flat board for pre-reaching activity.  Mod assist needed to push her hand up without moving into excessive internal rotation off of the board.  She tolerated 15 mins of active stimulation without any adverse reactions.  Finished session with transfer back to the wheelchair at mod assist level stand pivot and then back to the bed in her room at the same level.  Min assist for sit to supine transition with call button and phone  in reach and safety alarm in place.   Sa[place]o Stim One 330 pulse width 35 Hz pulse rate On 8 sec/ off 8 sec Ramp up/ down 2 sec Symmetrical Biphasic wave form  Max intensity 130m at 500 Ohm load       Therapy Documentation Precautions:  Precautions Precautions: Fall Precaution Comments: L hemiparesis Restrictions Weight Bearing Restrictions: No  Pain: Pain Assessment Pain Scale: 0-10 Pain Score: 0-No pain ADL: See Care Tool Section for some details of mobility and  selfcare  Therapy/Group: Individual Therapy  Curran Lenderman OTR/L 10/15/2020, 12:39 PM

## 2020-10-15 NOTE — Progress Notes (Signed)
PROGRESS NOTE   Subjective/Complaints: No complaints initially Denies pain When asked, says she feels she is moving her bowels too regularly and she would like her stool softeners decreased.   ROS- Denies CP, SOB, N/V/D   Objective:   No results found. Recent Labs    10/15/20 0547  WBC 5.3  HGB 11.0*  HCT 33.3*  PLT 330   Recent Labs    10/15/20 0547  NA 135  K 3.7  CL 105  CO2 21*  GLUCOSE 108*  BUN 16  CREATININE 1.05*  CALCIUM 9.2    Intake/Output Summary (Last 24 hours) at 10/15/2020 1117 Last data filed at 10/15/2020 0726 Gross per 24 hour  Intake 276 ml  Output --  Net 276 ml        Physical Exam: Vital Signs Blood pressure (!) 148/76, pulse 79, temperature 98.2 F (36.8 C), temperature source Oral, resp. rate 18, height '5\' 4"'$  (1.626 m), weight 71.4 kg, SpO2 98 %. Gen: no distress, normal appearing HEENT: oral mucosa pink and moist, NCAT Cardio: Reg rate Chest: normal effort, normal rate of breathing Abd: soft, non-distended Ext: no edema Psych: pleasant, normal affect Skin: intact Neurologic: Cranial nerves II through XII intact, motor strength is 5/5 in right deltoid, bicep, tricep, grip, hip flexor, knee extensors, ankle dorsiflexor and plantar flexor 3-/5 Left deltoid, 3 - biceps, 2 - triceps , 0/5 grip, 2- HF, KE, 0/5 ADF Sensory exam normal sensation to light touch and proprioception in bilateral upper and lower extremities  Musculoskeletal: No pain with range of motion in all 4 extremities. No joint swelling   Assessment/Plan: 1. Functional deficits which require 3+ hours per day of interdisciplinary therapy in a comprehensive inpatient rehab setting. Physiatrist is providing close team supervision and 24 hour management of active medical problems listed below. Physiatrist and rehab team continue to assess barriers to discharge/monitor patient progress toward functional and medical  goals  Care Tool:  Bathing    Body parts bathed by patient: Chest, Abdomen, Left arm, Right upper leg, Left upper leg, Face, Right lower leg   Body parts bathed by helper: Front perineal area, Buttocks, Left lower leg, Right arm     Bathing assist Assist Level: Moderate Assistance - Patient 50 - 74%     Upper Body Dressing/Undressing Upper body dressing   What is the patient wearing?: Pull over shirt    Upper body assist Assist Level: Minimal Assistance - Patient > 75%    Lower Body Dressing/Undressing Lower body dressing      What is the patient wearing?: Pants     Lower body assist Assist for lower body dressing: Moderate Assistance - Patient 50 - 74%     Toileting Toileting    Toileting assist Assist for toileting: Maximal Assistance - Patient 25 - 49%     Transfers Chair/bed transfer  Transfers assist     Chair/bed transfer assist level: Moderate Assistance - Patient 50 - 74%     Locomotion Ambulation   Ambulation assist      Assist level: Maximal Assistance - Patient 25 - 49% Assistive device: No Device Max distance: 10'   Walk 10 feet activity  Assist     Assist level: 2 helpers Assistive device: Other (comment) (rail in hallway)   Walk 50 feet activity   Assist Walk 50 feet with 2 turns activity did not occur: Safety/medical concerns         Walk 150 feet activity   Assist Walk 150 feet activity did not occur: Safety/medical concerns         Walk 10 feet on uneven surface  activity   Assist Walk 10 feet on uneven surfaces activity did not occur: Safety/medical concerns         Wheelchair     Assist Will patient use wheelchair at discharge?:  (TBD)      Wheelchair assist level: Dependent - Patient 0% Max wheelchair distance: 150'    Wheelchair 50 feet with 2 turns activity    Assist        Assist Level: Dependent - Patient 0%   Wheelchair 150 feet activity     Assist      Assist  Level: Dependent - Patient 0%   Blood pressure (!) 148/76, pulse 79, temperature 98.2 F (36.8 C), temperature source Oral, resp. rate 18, height '5\' 4"'$  (1.626 m), weight 71.4 kg, SpO2 98 %.  Medical Problem List and Plan: 1.  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  Antithrombotics: -DVT/anticoagulation:  Pharmaceutical: Lovenox             -antiplatelet therapy: ASA/Brillinta 3. Pain Management: Tylenol prn.  4. Mood: LCSW to follow for evaluation and support.              -antipsychotic agents: N/A 5. Neuropsych: This patient is capable of making decisions on her own behalf. 6. Skin/Wound Care: Routine pressure relief measures.  7. Fluids/Electrolytes/Nutrition: Monitor I/O.Heart healthy diet. 8.  NSTEMI s/p PCI/DES: On ASA/Brillinta 9. Chronic combined CHF: Monitor for signs of overload. Check weights daily             --On Entresto, toprol XL Spironolactone and Crestor.             --SBP goal 130-150 to prevent cerebral hypoperfusion. Vitals:   10/14/20 1933 10/15/20 0514  BP: (!) 143/84 (!) 148/76  Pulse: 86 79  Resp: 16 18  Temp: 98.6 F (37 C) 98.2 F (36.8 C)  SpO2: 95% 98%  Controlled but goal is higher will reduce toprol XL to 12.'5mg'$  on 7/23 monitor  10. T2DM: Hgb A1C-6.9. Monitor BS ac/hs. 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 Anemia: Likely due to DAPT/Lovenox. Reported to have some pink vaginal discharge--vaginally v/s bladder source?             --monitor for signs of bleeding. Monitor serial H/H. 12. Constipation: BM yesterday. Will add Senna and warm prune juice. Does not want to try anything stronger for now. Decrease Senna to 1 tab HS 13. Hypokalemia: supplement 79mq today,  monitor K+ weekly.      LOS: 6 days A FACE TO FACE EVALUATION WAS PERFORMED  KMartha ClanP Vertis Scheib 10/15/2020, 11:17 AM

## 2020-10-15 NOTE — Progress Notes (Signed)
Physical Therapy Session Note  Patient Details  Name: Tracey Meyer MRN: [id number] Date of Birth: 1961-03-16  Today's Date: 10/15/2020 PT Individual Time: [id number] PT Individual Time Calculation (min): 43 min   Short Term Goals: Week 1:  PT Short Term Goal 1 (Week 1): Pt will complete least restrictive transfer with min A consistently PT Short Term Goal 2 (Week 1): Pt will ambulate with assist x 1 consistently PT Short Term Goal 3 (Week 1): Pt will initiate stair training  Skilled Therapeutic Interventions/Progress Updates:    Patient in supine and reports no pain initially.  RN in to deliver medication.  Patient supine to sit with CGA.  Patient transferred to w/c with CGA.  Assisted in w/c to therapy gym.  Assisted to don shoes max A for time management.  Patient sit to stand to RW with L UE hand splint with min A.  Patient ambulated 26' with min to mod A for L LE facilitation in stance due to occasional R knee hyperextension and hip flexion versus activation of extensor tone and difficulty with liftoff.  Patient performed standing balance/NMR for L in stance for alternate foot taps to cone, then to 4" step with UE support and min to mod A for L LE stability in stance and cues with occasional use of mirror for feedback for postural control.  Performed seated hamstring curls with orange t-band first on L then on R for improved muscle activation and feedback.  Attempted seated plantarflexion, but pt unable.  Performed 2 x 10 in standing with mod facilitation for L lateral weight shift and glute activation with PF.  Patient ambulated x 20' back to w/c with RW L hand splint and mod A for occasional L LE progression due to fatigue and increased activation of extensor tone.  Patient assisted in w/c to room and left in w/c with call bell, needs in reach and seat belt alarm active.   Therapy Documentation Precautions:  Precautions Precautions: Fall Precaution Comments: L  hemiparesis Restrictions Weight Bearing Restrictions: No  Pain: Pain Assessment Faces Pain Scale: Hurts a little bit Pain Type: Acute pain Pain Location: Leg Pain Orientation: Left Pain Descriptors / Indicators: Tightness;Discomfort Pain Onset: With Activity Pain Intervention(s): Repositioned;Other (Comment) (stretching)    Therapy/Group: Individual Therapy  Reginia Naas Magda Kiel, PT 10/15/2020, 5:36 PM

## 2020-10-16 LAB — GLUCOSE, CAPILLARY
Glucose-Capillary: 129 mg/dL — ABNORMAL HIGH (ref 70–99)
Glucose-Capillary: 114 mg/dL — ABNORMAL HIGH (ref 70–99)
Glucose-Capillary: 110 mg/dL — ABNORMAL HIGH (ref 70–99)
Glucose-Capillary: 138 mg/dL — ABNORMAL HIGH (ref 70–99)

## 2020-10-16 NOTE — Progress Notes (Signed)
Speech Language Pathology Weekly Progress and Session Note  Patient Details  Name: Tracey Meyer MRN: [id number] Date of Birth: 10/02/1960  Beginning of progress report period: October 09, 2020 End of progress report period: October 16, 2020  Today's Date: 10/16/2020 SLP Individual Time: 1435-1530 SLP Individual Time Calculation (min): 55 min  Short Term Goals: Week 1: SLP Short Term Goal 1 (Week 1): Patient will consume current diet without overt s/s of aspiration with Mod I for use of swallowing compensatory strategies to decrease globus sensation (upright 90 degrees for meals, sit up 30-60 mins after meals, follow solids/liquids). SLP Short Term Goal 1 - Progress (Week 1): Met SLP Short Term Goal 2 (Week 1): Patient will utilize speech intelligibility strategies at the sentence level to maximize intelligibility and decrease shortness of breath with overall Min verbal cues. SLP Short Term Goal 2 - Progress (Week 1): Met SLP Short Term Goal 3 (Week 1): Patient will demosntrate complex problem solving for functional tasks with supervision verbal cues. SLP Short Term Goal 3 - Progress (Week 1): Not met SLP Short Term Goal 4 (Week 1): Patient will self-monitor and correct erorrs during complex tasks with supervision verbal cues. SLP Short Term Goal 4 - Progress (Week 1): Not met SLP Short Term Goal 5 (Week 1): Patient will utilize memory compensatory strategies to recall functional information with Min verbal and visual cues. SLP Short Term Goal 5 - Progress (Week 1): Met    New Short Term Goals: Week 2: SLP Short Term Goal 1 (Week 2): Patient will utilize speech intelligibility strategies at the sentence level to maximize intelligibility and decrease shortness of breath with overall Sup A verbal cues. SLP Short Term Goal 2 (Week 2): Patient will demosntrate complex problem solving for functional tasks with supervision verbal cues. SLP Short Term Goal 3 (Week 2): Patient will self-monitor and  correct erorrs during complex tasks with supervision verbal cues. SLP Short Term Goal 4 (Week 2): Patient will utilize memory compensatory strategies to recall functional information with Sup verbal and visual cues.  Weekly Progress Updates: Patient has met 3 of 5 short term goals this reporting period. Patient has made functional progress with ST over the past week. Patient is currently completing cognitive-linguistic tasks with min A verbal and visual cues for basic tasks, and required mod A for complex cognitive tasks involving complex problem solving, planning, and organizing. Patient is currently tolerating a regular diet and thin liquids with no signs or symptoms of aspiration and minimal complaints of globus sensation. She is at Mod I for use of swallow strategies. She is able to implement speech intelligibility strategies at sentence level with sup-to-min A verbal reminders to improve speech intelligibility which is perceived as 75-90% intelligible at conversation level, and >90% at sentence level. Patient/family education is ongoing. Recommend continued skilled ST intervention to maximize cognitive-linguistic and speech function and functional independence prior to discharge. Continue progression toward established LTGs set at Mod I assist level overall.    Intensity: Minumum of 1-2 x/day, 30 to 90 minutes Frequency: 3 to 5 out of 7 days Duration/Length of Stay: 14-16 days Treatment/Interventions: Cognitive remediation/compensation;Dysphagia/aspiration precaution training;Internal/external aids;Speech/Language facilitation;Therapeutic Activities;Environmental controls;Cueing hierarchy;Functional tasks;Patient/family education   Daily Session  Skilled Therapeutic Interventions: Patient agreeable to skilled ST intervention with focus on cognitive-linguistic goals. SLP facilitated session by providing overall mod A verbal and visual cues for organization, planning, and deductive reasoning with  complex deduction puzzle. Patient required frequent cues to slow down  to allow for  additional processing to ensure she collected all pertinent information, as she was known to answer without having all necessary details. She benefited from visual support to assist with organizing information and for recall of what was previously addressed. Provided sup A-to-min A verbal cues to implement speech intelligibility strategies to increase speech precision. Patient was left in bed with alarm activated and immediate needs within reach at end of session. Continue per current plan of care.     General    Pain Pain Assessment Pain Scale: 0-10 Pain Score: 0-No pain  Therapy/Group: Individual Therapy  Patty Sermons 10/16/2020, 4:19 PM

## 2020-10-16 NOTE — Progress Notes (Signed)
PROGRESS NOTE   Subjective/Complaints: Labs reviewed ok, patient feels like her strength is getting a little bit better.  No other complaints today.  Discussed that team conference will be tomorrow  ROS- Denies CP, SOB, N/V/D   Objective:   No results found. Recent Labs    10/15/20 0547  WBC 5.3  HGB 11.0*  HCT 33.3*  PLT 330    Recent Labs    10/15/20 0547  NA 135  K 3.7  CL 105  CO2 21*  GLUCOSE 108*  BUN 16  CREATININE 1.05*  CALCIUM 9.2     Intake/Output Summary (Last 24 hours) at 10/16/2020 0909 Last data filed at 10/16/2020 0753 Gross per 24 hour  Intake 713 ml  Output --  Net 713 ml         Physical Exam: Vital Signs Blood pressure 122/81, pulse 69, temperature 98.5 F (36.9 C), temperature source Oral, resp. rate 16, height '5\' 4"'$  (1.626 m), weight 71.4 kg, SpO2 100 %.  General: No acute distress Mood and affect are appropriate Heart: Regular rate and rhythm no rubs murmurs or extra sounds Lungs: Clear to auscultation, breathing unlabored, no rales or wheezes Abdomen: Positive bowel sounds, soft nontender to palpation, nondistended Extremities: No clubbing, cyanosis, or edema Skin: No evidence of breakdown, no evidence of rash  Neurologic: Cranial nerves II through XII intact, motor strength is 5/5 in right deltoid, bicep, tricep, grip, hip flexor, knee extensors, ankle dorsiflexor and plantar flexor 3-/5 Left deltoid, 3 - biceps, 2 - triceps , 0/5 grip, 2- HF, KE, 0/5 ADF Sensory exam normal sensation to light touch and proprioception in bilateral upper and lower extremities  Musculoskeletal: No pain with range of motion in all 4 extremities. No joint swelling   Assessment/Plan: 1. Functional deficits which require 3+ hours per day of interdisciplinary therapy in a comprehensive inpatient rehab setting. Physiatrist is providing close team supervision and 24 hour management of active  medical problems listed below. Physiatrist and rehab team continue to assess barriers to discharge/monitor patient progress toward functional and medical goals  Care Tool:  Bathing    Body parts bathed by patient: Right arm, Chest, Abdomen, Right upper leg, Left upper leg, Face, Left lower leg, Front perineal area, Buttocks   Body parts bathed by helper: Left arm, Right lower leg     Bathing assist Assist Level: Minimal Assistance - Patient > 75%     Upper Body Dressing/Undressing Upper body dressing   What is the patient wearing?: Pull over shirt    Upper body assist Assist Level: Minimal Assistance - Patient > 75%    Lower Body Dressing/Undressing Lower body dressing      What is the patient wearing?: Pants     Lower body assist Assist for lower body dressing: Moderate Assistance - Patient 50 - 74%     Toileting Toileting    Toileting assist Assist for toileting: Maximal Assistance - Patient 25 - 49%     Transfers Chair/bed transfer  Transfers assist     Chair/bed transfer assist level: Moderate Assistance - Patient 50 - 74%     Locomotion Ambulation   Ambulation assist  Assist level: Moderate Assistance - Patient 50 - 74% Assistive device: No Device Max distance: 10'   Walk 10 feet activity   Assist     Assist level: 2 helpers Assistive device: Other (comment) (rail in hallway)   Walk 50 feet activity   Assist Walk 50 feet with 2 turns activity did not occur: Safety/medical concerns         Walk 150 feet activity   Assist Walk 150 feet activity did not occur: Safety/medical concerns         Walk 10 feet on uneven surface  activity   Assist Walk 10 feet on uneven surfaces activity did not occur: Safety/medical concerns         Wheelchair     Assist Will patient use wheelchair at discharge?:  (TBD)      Wheelchair assist level: Dependent - Patient 0% Max wheelchair distance: 150'    Wheelchair 50 feet  with 2 turns activity    Assist        Assist Level: Dependent - Patient 0%   Wheelchair 150 feet activity     Assist      Assist Level: Dependent - Patient 0%   Blood pressure 122/81, pulse 69, temperature 98.5 F (36.9 C), temperature source Oral, resp. rate 16, height '5\' 4"'$  (1.626 m), weight 71.4 kg, SpO2 100 %.  Medical Problem List and Plan: 1.  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  Antithrombotics: -DVT/anticoagulation:  Pharmaceutical: Lovenox             -antiplatelet therapy: ASA/Brillinta 3. Pain Management: Tylenol prn.  4. Mood: LCSW to follow for evaluation and support.              -antipsychotic agents: N/A 5. Neuropsych: This patient is capable of making decisions on her own behalf. 6. Skin/Wound Care: Routine pressure relief measures.  7. Fluids/Electrolytes/Nutrition: Monitor I/O.Heart healthy diet. 8.  NSTEMI s/p PCI/DES: On ASA/Brillinta 9. Chronic combined CHF: Monitor for signs of overload. Check weights daily             --On Entresto, toprol XL Spironolactone and Crestor.             --SBP goal 130-150 to prevent cerebral hypoperfusion. Vitals:   10/15/20 2001 10/16/20 0513  BP: 140/74 122/81  Pulse: 78 69  Resp: 18 16  Temp: 98.2 F (36.8 C) 98.5 F (36.9 C)  SpO2: 100% 100%  Controlled but goal is higher will reduce toprol XL to 12.'5mg'$  on 7/23 monitor , off spironolactone due to BP low  10. T2DM: Hgb A1C-6.9. Monitor BS ac/hs. Educate on CM diet.Newly diagnosed CBG (last 3)  Recent Labs    10/15/20 1649 10/15/20 2008 10/16/20 0634  GLUCAP 185* 108* 129*   Controlled 7/26  -- Continue metformin bid (recently added)  11. Anemia: Likely due to DAPT/Lovenox. Reported to have some pink vaginal discharge--vaginally v/s bladder source?             --monitor for signs of bleeding. Monitor  serial H/H. 12. Constipation: BM yesterday. Will add Senna and warm prune juice. Does not want to try anything stronger for now. Decrease Senna to 1 tab HS 13. Hypokalemia: supplement 9mq today, monitor K+ weekly.  LOS: 7 days A FACE TO FACE EVALUATION WAS PERFORMED  Charlett Blake 10/16/2020, 9:09 AM

## 2020-10-16 NOTE — Progress Notes (Signed)
Physical Therapy Session Note  Patient Details  Name: Tracey Meyer MRN: [id number] Date of Birth: September 01, 1960  Today's Date: 10/16/2020 PT Individual Time: [id number] PT Individual Time Calculation (min): 56 min and 26 min  Short Term Goals: Week 1:  PT Short Term Goal 1 (Week 1): Pt will complete least restrictive transfer with min A consistently PT Short Term Goal 2 (Week 1): Pt will ambulate with assist x 1 consistently PT Short Term Goal 3 (Week 1): Pt will initiate stair training  Skilled Therapeutic Interventions/Progress Updates:    Session 1: Pt received sitting in w/c with NT present and pt agreeable to therapy session.  Transported to/from gym in w/c for time management and energy conservation. Sit>stands throughout session min assist - cuing for L hand placement on w/c armrest to facilitate normalized movement patterns and cuing for proper L LE positioning prior to initiating coming to stand to promote increased WBing.   Gait training ~90f w/c>EOM using RW with mod assist for balance and L LE management - demos excessive trunk/hip flexion with L LE extensor tone (pt tends to slightly "squat" during R stance phase to "break" L LE extensor tone and allow her to advance L LE during swing) pt has alternating extensor tone to flexor tone during swing causing difficulty to advance L LE during swing and having excessive L ankle inversion.  Gait training at R hallway rail to allow therapist increased ability to facilitate L LE gait mechanics - cuing to use R UE only as needed on handrail; however, pt has increased anxiety with this causing increased tone therefore allowed R UE support for entirety. Gait training [id number] at hallway rail with mod assist for balance and L LE advancement and stance phase mechanics - donned DF assist ACE wrap during 3rd and 4th trials - throughout, pt continues to have L extensor tone during stance requiring cuing/facilitation to extend L hip and  advance pelvis over that LE to avoid a "jack knifing" affect causing pt to have excessive forward movement, therapist also blocking excessive knee hyperextension - requires assist to advance L LE during swing due to flexor tone not allowing knee extension for L heel strike on initial contact.   Squat pivot transfers during session with min/mod assist for balance and pivoting hips - cuing for sequencing and promoting increased L LE WBing. At end of session pt agreeable to remain in w/c - left with needs in reach, L UE supported on 1/2 lap tray, and seat belt alarm on.   Session 2: Pt received sitting in w/c and agreeable to therapy session. Pt states L ischial tuberosity soreness from prolonged sitting in w/c - pt on Roho air cushion - therapist let out some air to help disperse forces more evenly to decrease concentrated area of pressure. Sit>stand w/c>no AD with min assist and performed static standing 1 minute for pressure relief.  Transported to/from gym in w/c for time management and energy conservation. L squat pivot w/c<>EOM min assist for balance and cuing to promote increased L LE WBing. Dynamic standing balance and L UE NMR task via grasping bean bags and then dropping onto corn hole board - requires assistance to open digits and place bean bag in hand then the pt unable to open fingers to release bean bag - requires min assist for balance and cuing for increased trunk/hip extension without causing L LE extensor tone to be activated. Transported back to room and pt requesting to return to bed. L  squat pivot as above. Sit>supine CGA. In supine performed bridging x10 reps and then L LE heel slides x5 reps with pt education on performing these outside of therapy targeting improved L LE motor control/coordination and tone management. Pt left supine in bed with needs in reach and bed alarm on.  Therapy Documentation Precautions:  Precautions Precautions: Fall Precaution Comments: L  hemiparesis Restrictions Weight Bearing Restrictions: No   Pain:  Session 1: Denies pain during session.  Session 2: Denies pain during session.   Therapy/Group: Individual Therapy  Tawana Scale , PT, DPT, NCS, CSRS 10/16/2020, 7:57 AM

## 2020-10-16 NOTE — Progress Notes (Signed)
Occupational Therapy Session Note  Patient Details  Name: Tracey Meyer MRN: [id number] Date of Birth: [birth year]  Today's Date: 10/16/2020 OT Individual Time: 1004-1103 OT Individual Time Calculation (min): 59 min    Short Term Goals: Week 1:  OT Short Term Goal 1 (Week 1): Pt will complete UB dressing with supervision for pullover shirt. OT Short Term Goal 2 (Week 1): Pt will complete LB bathing sit to stand with min assist. OT Short Term Goal 3 (Week 1): Pt will complete LB dressing with min assist sit to stand for underpants and pants only. OT Short Term Goal 4 (Week 1): Pt will complete stand pivot transfer to the 3:1 with min assist. OT Short Term Goal 5 (Week 1): Pt will use the LUE as a gross assist with supervision during bathing and grooming tasks.  Skilled Therapeutic Interventions/Progress Updates:    Pt in bed to start with her spouse and cousin present initially to visit.  Min assist for supine to sit EOB with min assist for stand pivot transfer to the wheelchair.  She then complete mod assist transfer to the therapy mat in the gym.  Completed stretches to the anterior left shoulder as well as scapular mobilizations in sitting with placement of the [place] Estim on the left digit extensors to help decrease tone and facilitation digit extension.  Kept stimulation active with weightbearing tasks of coming down on the left elbow and using the LUE to push back up to sitting.  Min assist to complete 2 sets of 10 reps.  Next, had her work on weighbearing through the LLE and LUE with functional reaching with the RUE to place playing cards on the mirror from sit to squat position.  Min to mod assist to maintain balance in squat position as well.  Removed stimulation after 20 mins with no adverse reactions and rest of session focusing on active movement of left shoulder flexion and elbow extension using tilted stool.  She was able to use the LUE to push the stool when resting on two legs on  the floor with supervision and min instructional cueing for posture.  Therapist increased difficulty by placing the stool on 5" step in order to increase angle and she needed occasional min assist to push to target.  Finished session with transfer back to the wheelchair at [facility] assist and return to the room.  She was left with the call button and phone in reach and safety alarm belt in place.    Therapy Documentation Precautions:  Precautions Precautions: Fall Precaution Comments: L hemiparesis Restrictions Weight Bearing Restrictions: No  Pain: Pain Assessment Pain Scale: Faces Pain Score: 0-No pain ADL: See Care Tool Section for some details of mobility and selfcare   Therapy/Group: Individual Therapy  Hendel Gatliff OTR/L 10/16/2020, 12:40 PM

## 2020-10-17 LAB — GLUCOSE, CAPILLARY
Glucose-Capillary: 128 mg/dL — ABNORMAL HIGH (ref 70–99)
Glucose-Capillary: 144 mg/dL — ABNORMAL HIGH (ref 70–99)
Glucose-Capillary: 118 mg/dL — ABNORMAL HIGH (ref 70–99)
Glucose-Capillary: 161 mg/dL — ABNORMAL HIGH (ref 70–99)

## 2020-10-17 MED ORDER — TIZANIDINE HCL 2 MG PO TABS
2.0000 mg | ORAL_TABLET | Freq: Three times a day (TID) | ORAL | Status: DC
  Administered 2020-10-17 – 2020-11-05 (×57): 2 mg via ORAL
  Filled 2020-10-17 (×57): qty 1

## 2020-10-17 NOTE — Progress Notes (Signed)
Physical Therapy Session Note  Patient Details  Name: Tracey Meyer MRN: [id number] Date of Birth: October 26, 1960  Today's Date: 10/17/2020 PT Individual Time: [id number] PT Individual Time Calculation (min): 30 min   Short Term Goals: Week 1:  PT Short Term Goal 1 (Week 1): Pt will complete least restrictive transfer with min A consistently PT Short Term Goal 2 (Week 1): Pt will ambulate with assist x 1 consistently PT Short Term Goal 3 (Week 1): Pt will initiate stair training  Skilled Therapeutic Interventions/Progress Updates: Pt presents sitting in w/c and c/o 10/10 pain o buttocks from seat.  Pt agreeable to therapy.  Pt wheeled to hallway and utilized R hand rail.  Pt transferred sit to stand w/ min A and verbal cues for forward scoot and R UE on w/c armrest rather than railing.  Pt amb multiple trials forward and back 10' each way.  Pt required mod A to block L knee hyperextension.  Pt amb w/ improved placement of LLE and hyperextension only approx. 20% today.  Pt amb x 12' w/ HHA doorway to bed.  Pt transfers sit to supine w/ min A.  Pt able to bridge to scoot hips to middle of bed, w/ manual assist to maintain flexed knee on left.  Bed alarm on and all needs in reach.     Therapy Documentation Precautions:  Precautions Precautions: Fall Precaution Comments: L hemiparesis Restrictions Weight Bearing Restrictions: No General:   Vital Signs:   Pain:10/10 buttocks from w/c. Pain Assessment Pain Scale: Faces Pain Score: 0-No pain Faces Pain Scale: Hurts a little bit Pain Location: Buttocks Mobility:      Therapy/Group: Individual Therapy  [name] 10/17/2020, 11:16 AM

## 2020-10-17 NOTE — Patient Care Conference (Signed)
Inpatient RehabilitationTeam Conference and Plan of Care Update Date: 10/17/2020   Time: 10:54 AM    Patient Name: Tracey Meyer      Medical Record Number: [id number]  Date of Birth: [birth year] Sex: Female         Room/Bed: 4W06C/4W06C-01 Payor Info: Payor: MEDICAID POTENTIAL / Plan: MEDICAID POTENTIAL / Product Type: *No Product type* /    Admit Date/Time:  10/09/2020  1:37 PM  Primary Diagnosis:  Embolic stroke [facility])  Hospital Problems: Principal Problem:   Embolic stroke [facility])    Expected Discharge Date: Expected Discharge Date: 11/02/20  Team Members Present: Physician leading conference: Dr. Alysia Penna Social Worker Present: Erlene Quan, BSW Nurse Present: Dorien Chihuahua, RN PT Present: Page Spiro, PT OT Present: Clyda Greener, OT SLP Present: Sherren Kerns, SLP PPS Coordinator present : Gunnar Fusi, SLP     Current Status/Progress Goal Weekly Team Focus  Bowel/Bladder             Swallow/Nutrition/ Hydration   Mod I - regular diet, thin liquids  Mod I  Reviewing safe swallow strategies and precautions   ADL's   Currently min assist for UB selfcare with min to mod for LB selfcare.  LUE Brunnstrum stage III-IV with slight increased tone in the elbow flexors and digits.  Transfers mod assist overall stand pivot with min assist for squat pivot.  supervision to contact guard  selfcare retraining, transfer training, DME education, balance retraining, therapeutic activites, pt education   Mobility   min assist bed mobility, min assist sit<>stand, min/mod assist bed<>chair transfers, mod assist gait up to 73f at R hallway rail with pt demonstrating alternating flexor tone (swing phase) vs extensor tone (stance phase), not yet advanced to stair navigation  supervision overall at ambulatory level  transfer training, L LE NMR, tone management, activity tolerance, gait training, pt education, dynamic standing balance   Communication   sup A for  intelligibility  Mod I  speech intelligibility strategies at sentence and conversational level   Safety/Cognition/ Behavioral Observations  min A for basic, mod A for complex  Mod I  executive functions - planning, organization, medication management   Pain             Skin               Discharge Planning:  D/c home with S.O and sister   Team Discussion: Medically stable per MD. Continent of bowel and bladder. Encourage fluids for hydration on Regular - thin diet. Progress limited by flex tone in elbow and fingers and flex-extension tone in leg yielding a snap into hyper-extension and flexed forward stance and ankle inversion.  Patient on target to meet rehab goals: yes, currently min assist for upper body care and min - mod assist for lower body care. Requires mod assist for transfers with min assist for sit- stand . Able to stand pivot transfer with min assist and ambulate 30' with handrails in the hall. Discharge goals set for CGA - supervision level.  *See Care Plan and progress notes for long and short-term goals.   Revisions to Treatment Plan:  Working on speech intelligibility and speaking at a sentence and conversational level, increased vocal intensity, organization, planning, process for cognitive tasks.   Teaching Needs: Safety, medications, transfers, toileting, etc  Current Barriers to Discharge: Decreased caregiver support and Home enviroment access/layout  Possible Resolutions to Barriers: Family education     Medical Summary Current Status: poor fluid intke, BPs on low side  but improving with hold of spironolactone adn reduced toprol XL, spasticity LLE and LUE  Barriers to Discharge: Medical stability   Possible Resolutions to Barriers/Weekly Focus: Cont with BP management , enc po fluid   Continued Need for Acute Rehabilitation Level of Care: The patient requires daily medical management by a physician with specialized training in physical medicine and  rehabilitation for the following reasons: Direction of a multidisciplinary physical rehabilitation program to maximize functional independence : Yes Medical management of patient stability for increased activity during participation in an intensive rehabilitation regime.: Yes Analysis of laboratory values and/or radiology reports with any subsequent need for medication adjustment and/or medical intervention. : Yes   I attest that I was present, lead the team conference, and concur with the assessment and plan of the team.   Dorien Chihuahua B 10/17/2020, 5:09 PM

## 2020-10-17 NOTE — Progress Notes (Signed)
Speech Language Pathology Daily Session Note  Patient Details  Name: Tracey Meyer MRN: [id number] Date of Birth: Apr 09, 1960  Today's Date: 10/17/2020 SLP Individual Time: 1000-1045 SLP Individual Time Calculation (min): 45 min  Short Term Goals: Week 2: SLP Short Term Goal 1 (Week 2): Patient will utilize speech intelligibility strategies at the sentence level to maximize intelligibility and decrease shortness of breath with overall Sup A verbal cues. SLP Short Term Goal 2 (Week 2): Patient will demosntrate complex problem solving for functional tasks with supervision verbal cues. SLP Short Term Goal 3 (Week 2): Patient will self-monitor and correct erorrs during complex tasks with supervision verbal cues. SLP Short Term Goal 4 (Week 2): Patient will utilize memory compensatory strategies to recall functional information with Sup verbal and visual cues.  Skilled Therapeutic Interventions: Skilled SLP intervention focused on cognition. Pt completed deductive reasoning puzzle with moderate assistance for attention to written details. She was able to reason where items belong and use process of elimination when given verbal cues. She answered problem solving questions related to rehab schedule with min A verbal cues. Cont with therapy per plan of care.      Pain Pain Assessment Pain Scale: Faces Pain Score: 0-No pain Faces Pain Scale: Hurts a little bit Pain Location: Buttocks  Therapy/Group: Individual Therapy  Darrol Poke Tracey Meyer 10/17/2020, 10:43 AM

## 2020-10-17 NOTE — Progress Notes (Signed)
Occupational Therapy Weekly Progress Note  Patient Details  Name: Tracey Meyer MRN: [id number] Date of Birth: Jul 22, 1960  Beginning of progress report period: October 10, 2020 End of progress report period: October 17, 2020  Today's Date: 10/17/2020 OT Individual Time: 0800-0902 OT Individual Time Calculation (min): 62 min    Patient has met 3 of 5 short term goals.  Tracey Meyer is making excellent progress with OT at this time.  She currently completes UB bathing and dressing with min assist following hemi techniques.  LB bathing is also at a min assist level with LB dressing at mod assist overall.  She continues to exhibit LUE and LLE hemiparesis with slight flexor tone noted in the biceps, finger flexors, and internal rotators.  She currently uses the LUE as a gross assist  with supervision for holding items to be opened as well as holding her washcloth when applying soap.  Mod assist is needed when using it as an active assist for washing the RUE.  Transfers are currently min to mod assist stand pivot to the 3:1 and shower seat secondary to LLE strength and decreased balance.  Feel overall she is making steady progress toward established LTGs at min guard assist.  Recommend continued CIR level therapy at this time.     Patient continues to demonstrate the following deficits: muscle weakness, muscle joint tightness, and muscle paralysis, impaired timing and sequencing, abnormal tone, unbalanced muscle activation, and decreased coordination, decreased memory, and decreased sitting balance, decreased standing balance, decreased postural control, hemiplegia, and decreased balance strategies and therefore will continue to benefit from skilled OT intervention to enhance overall performance with BADL and Reduce care partner burden.  Patient progressing toward long term goals..  Continue plan of care.  OT Short Term Goals Week 2:  OT Short Term Goal 1 (Week 2): Pt will complete LB dressing with min  assist sit to stand for underpants and pants only. OT Short Term Goal 2 (Week 2): Pt will complete stand pivot transfer to the 3:1 with min assist for two consecutive sessions. OT Short Term Goal 3 (Week 2): Pt will use the LUE during bathing tasks with min assist for washing the RUE and the L upper leg. OT Short Term Goal 4 (Week 2): Pt will complete toileting tasks with min assist for 2 consecutive sessions.  Skilled Therapeutic Interventions/Progress Updates:    Pt completed shower and dressing during session.  She was able to complete transfer to the shower with use of the RW with the hand splint on the left side and mod assist to walk to the bathroom.  She was able to complete removal of all clothing at min assist prior to shower with completion of showering at min assist level.  She was able to complete sit to stand for washing peri area with min assist.  She needed min assist for washing her left buttocks as well.  She was able to complete washing the LUE using the RUE with mod facilitation.  Next progressed to completion of dressing at the sink with mod assist transfer from the shower to the wheelchair.   She completed donning pullover shirt with supervision and then donned her pants at mod assist.  Gripper socks at mod assist as well following one handed technique.  Finished session with oral hygiene in the wheelchair with setup.  Call button and phone in reach with safety belt in place.    Therapy Documentation Precautions:  Precautions Precautions: Fall Precaution Comments: L hemiparesis  Restrictions Weight Bearing Restrictions: No  Pain: Pain Assessment Pain Scale: Faces Pain Score: 0-No pain ADL: See Care Tool Section for some details of mobility and selfcare   Therapy/Group: Individual Therapy  Tracey Meyer OTR/L 10/17/2020, 10:36 AM

## 2020-10-17 NOTE — Progress Notes (Addendum)
Patient ID: Tracey Meyer, female   DOB: 06/12/1960, 60 y.o.   MRN: [id number]  Team Conference Report to Patient/Family  Team Conference discussion was reviewed with the patient and caregiver, including goals, any changes in plan of care and target discharge date.  Patient and caregiver express understanding and are in agreement.  The patient has a target discharge date of 11/02/20.  Sw met with patient, provided conference updates. Patient progressing and doing well. No additional questions or concerns Christina J [place] 10/17/2020, 1:42 PM  

## 2020-10-17 NOTE — Progress Notes (Signed)
Physical Therapy Weekly Progress Note  Patient Details  Name: Tracey Meyer MRN: [id number] Date of Birth: Jul 16, 1960  Beginning of progress report period: October 10, 2020 End of progress report period: October 10, 2020  Today's Date: 10/17/2020 PT Individual Time: [id number] and [id number] PT Individual Time Calculation (min): 24 min and 42 min  Patient has met 1 of 3 short term goals. [name] is making steady progress with skilled physical therapy. She is performing supine<>sit with min assist, sit<>stand and bed<>chair transfers with min/mod assist, and ambulating up to 52ft using R hallway rail with min/mod assist for balance and L LE gait mechanics. Pt demonstrates L LE extensor tone with extensor thrust during stance phase that makes it difficult to transition into hip/knee flexion for swing phase of gait, but then has onset of flexor tone during swing causing excessive L ankle inversion. Pt will benefit from continued CIR level therapies to continue advancing independence with functional mobility.  Patient continues to demonstrate the following deficits muscle weakness and muscle joint tightness, decreased cardiorespiratoy endurance, impaired timing and sequencing, abnormal tone, unbalanced muscle activation, decreased coordination, and decreased motor planning,  , and decreased sitting balance, decreased standing balance, decreased postural control, hemiplegia, and decreased balance strategies and therefore will continue to benefit from skilled PT intervention to increase functional independence with mobility.  Patient progressing toward long term goals..  Continue plan of care.  PT Short Term Goals Week 1:  PT Short Term Goal 1 (Week 1): Pt will complete least restrictive transfer with min A consistently PT Short Term Goal 1 - Progress (Week 1): Progressing toward goal PT Short Term Goal 2 (Week 1): Pt will ambulate with assist x 1 consistently PT Short Term Goal 2 - Progress (Week  1): Met PT Short Term Goal 3 (Week 1): Pt will initiate stair training PT Short Term Goal 3 - Progress (Week 1): Progressing toward goal Week 2:  PT Short Term Goal 1 (Week 2): Pt will perform supine<>sit with CGA PT Short Term Goal 2 (Week 2): Pt will perform sit<>stands using LRAD with CGA PT Short Term Goal 3 (Week 2): Pt will perform bed<>chair transfers using LRAD with min assist consistently PT Short Term Goal 4 (Week 2): Pt will ambulate at least 78ft using LRAD with no more than mod assist of 1 PT Short Term Goal 5 (Week 2): Pt will initiate stair navigation training  Skilled Therapeutic Interventions/Progress Updates:  Ambulation/gait training;Balance/vestibular training;Cognitive remediation/compensation;Community reintegration;Discharge planning;Disease management/prevention;DME/adaptive equipment instruction;Functional electrical stimulation;Functional mobility training;Neuromuscular re-education;Pain management;Patient/family education;Psychosocial support;Splinting/orthotics;Stair training;Therapeutic Activities;Therapeutic Exercise;UE/LE Strength taining/ROM;UE/LE Coordination activities;Wheelchair propulsion/positioning;Skin care/wound management;Visual/perceptual remediation/compensation   Session 1: Pt received sitting in w/c and agreeable to therapy session. Pt reports she is still having significant pain in L buttocks near ischial tuberosity from prolonged upright sitting despite Roho air cushion. Sit>stand w/c>R HHA as needed with min assist for balance - stood for 1 minute for pressure relief while working on improved posture and equal B LE WBing. Transported to/from gym in w/c for time management and energy conservation. Therapist transitioned pt onto foam cushion to provide additional pelvic support to attempt improved dispersement of forces and pt reporting improvement in symptoms at this time. Pt requesting to return to bed due to fatigue. R stand pivot w/c>EOB, no AD, with min  assist for balance - facilitation and cuing for increased L LE WBing. Sit>supine using bedrail with CGA - verbal cuing for improved L LE management. In supine performed x10reps bridges and L LE heel  slides targeting L LE tone management and NMR. At end of session pt left supine in bed with needs in reach and bed alarm on.  Session 2: Pt received sitting in w/c reporting she is still experiencing increased L buttocks pain with prolonged sitting. Pt agreeable to therapy session. Sit>stand w/c>intermittent R HHA with min assist for balance - cuing to push up with L UE and proper L foot positioning prior to initiating coming to stand both for WBing and NMR. Stood for 1 minute for pressure relief.  Transported to/from gym in w/c for time management and energy conservation. Therapist retrieved a TIS wheelchair to allow pt improved pressure relief while still promoting increased upright, OOB activity during the day - pt reports increased comfort in this chair with relief of L buttocks pain. Donned L LE ankle DF assist ACE wrap. Gait training at R hallway rail 54f, 590fwith min/mod assist for L LE gait mechanics and balance - min/mod assist to advance L LE during swing phase to promote increased step length and heel strike on initial contact - min/mod assist to block L knee extensor thrust during stance phase - pt demonstrating significant improvement in gait mechanics compared to yesterday with improved trunk/hip extension for upright posture and some decreased tone. Transported back to room and pt agreeable to remain sitting in w/c - left with needs in reach, seat belt alarm on, and meal tray set-up.   Therapy Documentation Precautions:  Precautions Precautions: Fall Precaution Comments: L hemiparesis Restrictions Weight Bearing Restrictions: No   Pain: Session 1: Continues to report L buttocks pain from prolonged, upright sitting.  Session 2: Continues to report significant L buttocks pain from  prolonged upright sitting - therapist provided pt with TIS wheelchair for pressure relief - skin assessed and no signs of breakdown noted.    Therapy/Group: Individual Therapy  CaTawana Scale PT, DPT, NCS, CSRS 10/17/2020, 2:05 PM

## 2020-10-17 NOTE — Progress Notes (Signed)
PROGRESS NOTE   Subjective/Complaints:   No issues overnite , discussed spasticity LLE   ROS- Denies CP, SOB, N/V/D   Objective:   No results found. Recent Labs    10/15/20 0547  WBC 5.3  HGB 11.0*  HCT 33.3*  PLT 330    Recent Labs    10/15/20 0547  NA 135  K 3.7  CL 105  CO2 21*  GLUCOSE 108*  BUN 16  CREATININE 1.05*  CALCIUM 9.2     Intake/Output Summary (Last 24 hours) at 10/17/2020 1130 Last data filed at 10/17/2020 0804 Gross per 24 hour  Intake 555 ml  Output --  Net 555 ml         Physical Exam: Vital Signs Blood pressure 127/72, pulse 69, temperature 98.1 F (36.7 C), temperature source Oral, resp. rate 18, height '5\' 4"'  (1.626 m), weight 71.4 kg, SpO2 100 %.   General: No acute distress Mood and affect are appropriate Heart: Regular rate and rhythm no rubs murmurs or extra sounds Lungs: Clear to auscultation, breathing unlabored, no rales or wheezes Abdomen: Positive bowel sounds, soft nontender to palpation, nondistended Extremities: No clubbing, cyanosis, or edema Skin: No evidence of breakdown, no evidence of rash  Neurologic: Cranial nerves II through XII intact, motor strength is 5/5 in right deltoid, bicep, tricep, grip, hip flexor, knee extensors, ankle dorsiflexor and plantar flexor 3-/5 Left deltoid, 3 - biceps, 2 - triceps , 0/5 grip, 2- HF, KE, 0/5 ADF Sensory exam normal sensation to light touch and proprioception in bilateral upper and lower extremities  Musculoskeletal: No pain with range of motion in all 4 extremities. No joint swelling   Assessment/Plan: 1. Functional deficits which require 3+ hours per day of interdisciplinary therapy in a comprehensive inpatient rehab setting. Physiatrist is providing close team supervision and 24 hour management of active medical problems listed below. Physiatrist and rehab team continue to assess barriers to discharge/monitor  patient progress toward functional and medical goals  Care Tool:  Bathing    Body parts bathed by patient: Right arm, Chest, Abdomen, Right upper leg, Left upper leg, Face, Left lower leg, Front perineal area, Buttocks   Body parts bathed by helper: Left arm, Right lower leg     Bathing assist Assist Level: Minimal Assistance - Patient > 75%     Upper Body Dressing/Undressing Upper body dressing   What is the patient wearing?: Pull over shirt    Upper body assist Assist Level: Supervision/Verbal cueing    Lower Body Dressing/Undressing Lower body dressing      What is the patient wearing?: Pants     Lower body assist Assist for lower body dressing: Moderate Assistance - Patient 50 - 74%     Toileting Toileting    Toileting assist Assist for toileting: Maximal Assistance - Patient 25 - 49%     Transfers Chair/bed transfer  Transfers assist     Chair/bed transfer assist level: Moderate Assistance - Patient 50 - 74%     Locomotion Ambulation   Ambulation assist      Assist level: Moderate Assistance - Patient 50 - 74% Assistive device: Other (comment) (hand rail in hallway) Max distance:  10 (forward and back)   Walk 10 feet activity   Assist     Assist level: Moderate Assistance - Patient - 50 - 74% Assistive device: Other (comment)   Walk 50 feet activity   Assist Walk 50 feet with 2 turns activity did not occur: Safety/medical concerns         Walk 150 feet activity   Assist Walk 150 feet activity did not occur: Safety/medical concerns         Walk 10 feet on uneven surface  activity   Assist Walk 10 feet on uneven surfaces activity did not occur: Safety/medical concerns         Wheelchair     Assist Will patient use wheelchair at discharge?:  (TBD)      Wheelchair assist level: Dependent - Patient 0% Max wheelchair distance: 150'    Wheelchair 50 feet with 2 turns activity    Assist        Assist  Level: Dependent - Patient 0%   Wheelchair 150 feet activity     Assist      Assist Level: Dependent - Patient 0%   Blood pressure 127/72, pulse 69, temperature 98.1 F (36.7 C), temperature source Oral, resp. rate 18, height '5\' 4"'  (1.626 m), weight 71.4 kg, SpO2 100 %.  Medical Problem List and Plan: 1.  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 Formulized individual treatment plan based on medical history, underlying problem and comorbidities.   Continue CIR 2.  Antithrombotics: -DVT/anticoagulation:  Pharmaceutical: Lovenox             -antiplatelet therapy: ASA/Brillinta 3. Pain Management: Tylenol prn.  4. Mood: LCSW to follow for evaluation and support.              -antipsychotic agents: N/A 5. Neuropsych: This patient is capable of making decisions on her own behalf. 6. Skin/Wound Care: Routine pressure relief measures.  7. Fluids/Electrolytes/Nutrition: Monitor I/O.Heart healthy diet. 8.  NSTEMI s/p PCI/DES: On ASA/Brillinta 9. Chronic combined CHF: Monitor for signs of overload. Check weights daily             --On Entresto, toprol XL Spironolactone and Crestor.             --SBP goal 130-150 to prevent cerebral hypoperfusion. Vitals:   10/16/20 1949 10/17/20 0354  BP: (!) 146/90 127/72  Pulse: 79 69  Resp: 18 18  Temp: 98.4 F (36.9 C) 98.1 F (36.7 C)  SpO2: 100% 100%  Controlled but goal is higher will reduce toprol XL to 12.26m on 7/23 monitor , off spironolactone due to BP low - improving but tizanidine may lower will monitor  10. T2DM: Hgb A1C-6.9. Monitor BS ac/hs. 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 Anemia: Likely due to DAPT/Lovenox. Reported to have some pink vaginal discharge--vaginally v/s bladder source?             --monitor for signs of bleeding. Monitor serial H/H. 12. Constipation: BM yesterday. Will add Senna and warm prune juice.  Does not want to try anything stronger for now. Decrease Senna to 1 tab HS 13. Hypokalemia: supplement 70mq today, monitor K+ weekly.      LOS: 8 days A FACE TO FACE EVALUATION WAS PERFORMED  ACharlett Blake7/27/2022, 11:30 AM

## 2020-10-18 LAB — GLUCOSE, CAPILLARY
Glucose-Capillary: 127 mg/dL — ABNORMAL HIGH (ref 70–99)
Glucose-Capillary: 125 mg/dL — ABNORMAL HIGH (ref 70–99)
Glucose-Capillary: 152 mg/dL — ABNORMAL HIGH (ref 70–99)
Glucose-Capillary: 117 mg/dL — ABNORMAL HIGH (ref 70–99)

## 2020-10-18 NOTE — Progress Notes (Signed)
Occupational Therapy Session Note  Patient Details  Name: Tracey Meyer MRN: [id number] Date of Birth: 1960-12-18  Today's Date: 10/18/2020 OT Individual Time: [id number] OT Individual Time Calculation (min): 47 min    Short Term Goals: Week 2:  OT Short Term Goal 1 (Week 2): Pt will complete LB dressing with min assist sit to stand for underpants and pants only. OT Short Term Goal 2 (Week 2): Pt will complete stand pivot transfer to the 3:1 with min assist for two consecutive sessions. OT Short Term Goal 3 (Week 2): Pt will use the LUE during bathing tasks with min assist for washing the RUE and the L upper leg. OT Short Term Goal 4 (Week 2): Pt will complete toileting tasks with min assist for 2 consecutive sessions.  Skilled Therapeutic Interventions/Progress Updates:    Session 1: [phone])  Pt up in wheelchair to start session.  Took her down to the tub shower room for education on tub/shower transfers.  She was able to complete stand pivot transfer to the tub bench with overall min assist and mod instructional cueing for hand placement.  Discussed need for a hand held shower, which she reports that they already have.  Also discussed that her assist level may change as well and she may likely complete transfer with use of the RW, so will re-visit.  Next, took her to the therapy gym where she transferred to the therapy mat with min assist stand pivot and then to supine at the same level.  Therapist had her roll to the right side where he completed assessment of tightness in the scapula.  No increased tightness noted.  She was able to complete transition to sitting with min assist.  Applied NMES to the dorsal wrist extensors of the left hand using Saebo Stim One.  See below for parameters.  Had her focus on functional reach at knee level with mod facilitation at the elbow and shoulder while stimulation was active.  She was able to flex digits without stimulation to pick up wadded up paper  towels repeatedly and then place them with assist from stimulation to open the hand.  She tolerated 15 mins without any adverse reactions.  Finished session with transfer back to the wheelchair and return to the room.  Call button and phone in reach with safety alarm belt in place.    Session 2: [phone])  Pt in bed to start with work on rolling to the right and transitioning supine to sit EOB with min assist and mod instructional cueing without use of the rail.  She then completed stand pivot transfers to the wheelchair and then the therapy mat with min assist level.  Worked on LUE neuromuscular re-education with weightbearing tasks over the LUE.  Had her focus on sustained activation of left elbow extension while supporting herself with the LUE when reaching across her body to complete peg design task with the RUE.  She needed min assist to maintain balance and to keep the LUE in active extension during these tasks.  Incorporated the LUE on the therapy mat and the arm of the wheelchair with mod assist as well for sit to stand transitions during all transfers.  Returned to the room with transfer back to the EOB with min assist stand pivot and then back to the bed at min guard assist.  Call button and phone in reach with safety alarm in place.    Therapy Documentation Precautions:  Precautions Precautions: Fall Precaution Comments: L hemiparesis Restrictions  Weight Bearing Restrictions: No  Pain: Pain Assessment Pain Scale: 0-10 Pain Score: 0-No pain ADL: See Care Tool Section for some details of mobility and selfcare   Therapy/Group: Individual Therapy  Tracey Meyer OTR/L 10/18/2020, 12:17 PM

## 2020-10-18 NOTE — Progress Notes (Signed)
Speech Language Pathology Daily Session Note  Patient Details  Name: Tracey Meyer MRN: [id number] Date of Birth: [birth year]  Today's Date: 10/18/2020 SLP Individual Time: [id number] SLP Individual Time Calculation (min): 55 min  Short Term Goals: Week 2: SLP Short Term Goal 1 (Week 2): Patient will utilize speech intelligibility strategies at the sentence level to maximize intelligibility and decrease shortness of breath with overall Sup A verbal cues. SLP Short Term Goal 2 (Week 2): Patient will demosntrate complex problem solving for functional tasks with supervision verbal cues. SLP Short Term Goal 3 (Week 2): Patient will self-monitor and correct erorrs during complex tasks with supervision verbal cues. SLP Short Term Goal 4 (Week 2): Patient will utilize memory compensatory strategies to recall functional information with Sup verbal and visual cues.  Skilled Therapeutic Interventions: Skilled treatment session focused on cognitive goals. SLP facilitated session by providing extra time and overall Min A verbal cues for problem solving during a complex scheduling task. Patient was Mod I for organization and for sustained attention to task. Min verbal cues were needed for use of speech intelligibility strategies at the sentence level to achieve ~90% intelligibility. Patient left upright in bed with alarm on and all needs within reach. Continue with current plan of care.       Pain Pain Assessment Pain Scale: 0-10 Pain Score: 0-No pain  Therapy/Group: Individual Therapy  Euva Rundell 10/18/2020, 3:05 PM

## 2020-10-18 NOTE — Progress Notes (Signed)
Orthopedic Tech Progress Note          Called in order to [employer] for a [facility] COMBO  Patient Details:  AMAURI HELBLING Feb 23, 1961 [id number]

## 2020-10-18 NOTE — Progress Notes (Signed)
PROGRESS NOTE   Subjective/Complaints:  No pains, left shoulder feels stiff    ROS- Denies CP, SOB, N/V/D   Objective:   No results found. No results for input(s): WBC, HGB, HCT, PLT in the last 72 hours.  No results for input(s): NA, K, CL, CO2, GLUCOSE, BUN, CREATININE, CALCIUM in the last 72 hours.   Intake/Output Summary (Last 24 hours) at 10/18/2020 0752 Last data filed at 10/17/2020 1846 Gross per 24 hour  Intake 591 ml  Output --  Net 591 ml         Physical Exam: Vital Signs Blood pressure 125/84, pulse 71, temperature 98.5 F (36.9 C), temperature source Oral, resp. rate 18, height '5\' 4"'$  (1.626 m), weight 71.4 kg, SpO2 100 %.  General: No acute distress Mood and affect are appropriate Heart: Regular rate and rhythm no rubs murmurs or extra sounds Lungs: Clear to auscultation, breathing unlabored, no rales or wheezes Abdomen: Positive bowel sounds, soft nontender to palpation, nondistended Extremities: No clubbing, cyanosis, or edema Skin: No evidence of breakdown, no evidence of rash  Neurologic: Cranial nerves II through XII intact, motor strength is 5/5 in right deltoid, bicep, tricep, grip, hip flexor, knee extensors, ankle dorsiflexor and plantar flexor 3-/5 Left deltoid, 3 - biceps, 2 - triceps , 0/5 grip, 2- HF, KE, 0/5 ADF Sensory exam normal sensation to light touch and proprioception in bilateral upper and lower extremities  Musculoskeletal: reduced left shoulder abduction, neg impingement sign , no Left hand tenderness or swelling   Assessment/Plan: 1. Functional deficits which require 3+ hours per day of interdisciplinary therapy in a comprehensive inpatient rehab setting. Physiatrist is providing close team supervision and 24 hour management of active medical problems listed below. Physiatrist and rehab team continue to assess barriers to discharge/monitor patient progress toward functional  and medical goals  Care Tool:  Bathing    Body parts bathed by patient: Right arm, Chest, Abdomen, Right upper leg, Left upper leg, Face, Left lower leg, Front perineal area, Buttocks   Body parts bathed by helper: Left arm, Right lower leg     Bathing assist Assist Level: Minimal Assistance - Patient > 75%     Upper Body Dressing/Undressing Upper body dressing   What is the patient wearing?: Pull over shirt    Upper body assist Assist Level: Supervision/Verbal cueing    Lower Body Dressing/Undressing Lower body dressing      What is the patient wearing?: Pants     Lower body assist Assist for lower body dressing: Moderate Assistance - Patient 50 - 74%     Toileting Toileting    Toileting assist Assist for toileting: Maximal Assistance - Patient 25 - 49%     Transfers Chair/bed transfer  Transfers assist     Chair/bed transfer assist level: Minimal Assistance - Patient > 75% Chair/bed transfer assistive device: Armrests   Locomotion Ambulation   Ambulation assist      Assist level: Moderate Assistance - Patient 50 - 74% Assistive device: Other (comment) (R hallway rail) Max distance: 67f   Walk 10 feet activity   Assist     Assist level: Moderate Assistance - Patient - 50 - 74%  Assistive device: Other (comment) (R hallway rail)   Walk 50 feet activity   Assist Walk 50 feet with 2 turns activity did not occur: Safety/medical concerns         Walk 150 feet activity   Assist Walk 150 feet activity did not occur: Safety/medical concerns         Walk 10 feet on uneven surface  activity   Assist Walk 10 feet on uneven surfaces activity did not occur: Safety/medical concerns         Wheelchair     Assist Will patient use wheelchair at discharge?:  (TBD)      Wheelchair assist level: Dependent - Patient 0% Max wheelchair distance: 150'    Wheelchair 50 feet with 2 turns activity    Assist        Assist  Level: Dependent - Patient 0%   Wheelchair 150 feet activity     Assist      Assist Level: Dependent - Patient 0%   Blood pressure 125/84, pulse 71, temperature 98.5 F (36.9 C), temperature source Oral, resp. rate 18, height '5\' 4"'$  (1.626 m), weight 71.4 kg, SpO2 100 %.  Medical Problem List and Plan: 1.  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  Antithrombotics: -DVT/anticoagulation:  Pharmaceutical: Lovenox             -antiplatelet therapy: ASA/Brillinta 3. Pain Management: Tylenol prn.  4. Mood: LCSW to follow for evaluation and support.              -antipsychotic agents: N/A 5. Neuropsych: This patient is capable of making decisions on her own behalf. 6. Skin/Wound Care: Routine pressure relief measures.  7. Fluids/Electrolytes/Nutrition: Monitor I/O.Heart healthy diet. 8.  NSTEMI s/p PCI/DES: On ASA/Brillinta 9. Chronic combined CHF: Monitor for signs of overload. Check weights daily             --On Entresto, toprol XL Spironolactone and Crestor.             --SBP goal 130-150 to prevent cerebral hypoperfusion. Vitals:   10/17/20 2035 10/18/20 0559  BP: 116/77 125/84  Pulse: 77 71  Resp: 16 18  Temp: 98.2 F (36.8 C) 98.5 F (36.9 C)  SpO2: 99% 100%  Controlled but goal is higher will reduce toprol XL to 12.'5mg'$  on 7/23 monitor , off spironolactone due to BP low - improving but tizanidine may lower will monitor  10. T2DM: Hgb A1C-6.9. Monitor BS ac/hs. 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 Anemia: Likely due to DAPT/Lovenox. Reported to have some pink vaginal discharge--vaginally v/s bladder source?             --monitor for signs of bleeding. Monitor serial H/H. 12.  Constipation: BM yesterday. Will add Senna and warm prune juice. Does not want to try anything stronger for now. Decrease Senna to 1 tab HS 13. Hypokalemia: supplement 35mq today, monitor K+ weekly.      LOS: 9 days A FACE TO FACE EVALUATION WAS PERFORMED  ACharlett Blake7/28/2022, 7:52 AM

## 2020-10-18 NOTE — Progress Notes (Signed)
Physical Therapy Session Note  Patient Details  Name: Tracey Meyer MRN: [id number] Date of Birth: [birth year]  Today's Date: 10/18/2020 PT Individual Time: [id number] PT Individual Time Calculation (min): 56 min and 28 min   Short Term Goals: Week 2:  PT Short Term Goal 1 (Week 2): Pt will perform supine<>sit with CGA PT Short Term Goal 2 (Week 2): Pt will perform sit<>stands using LRAD with CGA PT Short Term Goal 3 (Week 2): Pt will perform bed<>chair transfers using LRAD with min assist consistently PT Short Term Goal 4 (Week 2): Pt will ambulate at least 19f using LRAD with no more than mod assist of 1 PT Short Term Goal 5 (Week 2): Pt will initiate stair navigation training  Skilled Therapeutic Interventions/Progress Updates:    Session 1: Pt received sitting in w/c and agreeable to therapy session. Reports some relief in L buttocks pain from new w/c but still has some discomfort - educated pt on calling for nursing assistance to re-adjust tilt position of wheelchair for improved pressure relief. Transported to/from gym in w/c for time management and energy conservation. Donned L LE DF assist ACE wrap. Gait training [id number] to R hallway rail using intermittent R UE support with min/mod assist of 1 for balance and L LE gait mechanics - pt demos some decreased L knee extensor thrust but this is due to pt maintaining knee in slight flexion - requires min/mod assist to advance L LE during swing due to intermittent extensor tone.  Placed heel wedge in L shoe to promote decreased extensor thrust.  Gait training 10014f2 next to R hallway rail with continued min/mod assist for balance and L LE gait mechanics - continues to require facilitation to advance L LE during swing to decrease extensor tone - with facilitation at L hip for increased L weight shift along with increased hip extensor and abductor activation during stance pt noted to have improved stability, decreased extensor  tone, and increased R step length.  Sitting in w/c<>tall kneeling on mat with B UE support on bench with min/mod assist for balance and manual facilitation to place L LE on/off mat. In tall kneeling with L UE support on bench to promote WBing for NMR during R UE slight cross body reaching task with focus on sustained L hip abductor and extensor activation with midline trunk alignment - requires min assist at trunk to maintain balance.  Transported back to room and pt requesting to return to bed. R stand pivot w/c>EOB min assist for balance - cuing for weight shifting onto stance limb and maintaining full trunk/hip extension. Sit>supine supervision using R UE support on bedrail. Pt left supine in bed with needs in reach and bed alarm on.   Session 2: Pt received supine in bed and agreeable to therapy session. Pt states she is having "soreness" in her calves - anticipate this is from increased ambulation distance during AM session. Supine>sitting R EOB, HOB flat and not using bedrail, via logroll technique for increased pt independence with min assist for L hemibody management and trunk upright. Donned tennis shoes max/total assist for time. L stand pivot EOB>w/c, R HHA, with min assist for balance - cuing/facilitation for R/L weight shifting onto stance limb to promote increased L LE WBing, cuing for trunk/hip extension to maintain upright posture while transferring. Transported to/from gym in w/c for time management and energy conservation. Stand pivot w/c<>Nustep as just described. Performed B LE reciprocal movement patterns on Nustep against level 4  resistance totaling 100steps for 5 minutes transitioned to only L LE movement against level 1 resistance with therapist moving other pedal to focus on L LE motor control to reciprocally activate hip/knee extensors vs hip/knee flexors totaling 35steps for 2 minutes. Transported back to room and pt requesting to return to bed. Stand pivot w/c>EOB as described earlier;  however, pt with 1x L LE extensor thrust causing minor forward trunk lunge. Sit>supine using R UE support on bedrail with supervision and cuing for increased L LE hip/knee flexion while brining it up onto bed. Pt left supine in bed with needs in reach and bed alarm on.  Therapy Documentation Precautions:  Precautions Precautions: Fall Precaution Comments: L hemiparesis Restrictions Weight Bearing Restrictions: No   Pain:  Session 1: No reports of pain throughout session.  Session 2: Reports some calf "soreness" - anticipate from increased ambulation distance in earlier session - no intervention needed.    Therapy/Group: Individual Therapy  Tawana Scale , PT, DPT, NCS, CSRS 10/18/2020, 7:57 AM

## 2020-10-19 LAB — GLUCOSE, CAPILLARY
Glucose-Capillary: 125 mg/dL — ABNORMAL HIGH (ref 70–99)
Glucose-Capillary: 185 mg/dL — ABNORMAL HIGH (ref 70–99)
Glucose-Capillary: 164 mg/dL — ABNORMAL HIGH (ref 70–99)
Glucose-Capillary: 120 mg/dL — ABNORMAL HIGH (ref 70–99)
Glucose-Capillary: 96 mg/dL (ref 70–99)

## 2020-10-19 MED ORDER — LORAZEPAM 0.5 MG PO TABS
0.5000 mg | ORAL_TABLET | Freq: Once | ORAL | Status: AC
  Administered 2020-10-19: 0.5 mg via ORAL
  Filled 2020-10-19: qty 1

## 2020-10-19 NOTE — Progress Notes (Signed)
PROGRESS NOTE   Subjective/Complaints:  No pains today , denies issues in therapy    ROS- Denies CP, SOB, N/V/D   Objective:   No results found. No results for input(s): WBC, HGB, HCT, PLT in the last 72 hours.  No results for input(s): NA, K, CL, CO2, GLUCOSE, BUN, CREATININE, CALCIUM in the last 72 hours.   Intake/Output Summary (Last 24 hours) at 10/19/2020 0900 Last data filed at 10/19/2020 0700 Gross per 24 hour  Intake 472 ml  Output --  Net 472 ml         Physical Exam: Vital Signs Blood pressure 137/77, pulse 64, temperature 97.9 F (36.6 C), resp. rate 17, height '5\' 4"'$  (1.626 m), weight 71.4 kg, SpO2 96 %.  General: No acute distress Mood and affect are appropriate Heart: Regular rate and rhythm no rubs murmurs or extra sounds Lungs: Clear to auscultation, breathing unlabored, no rales or wheezes Abdomen: Positive bowel sounds, soft nontender to palpation, nondistended Extremities: No clubbing, cyanosis, or edema Skin: No evidence of breakdown, no evidence of rash Neurologic: Cranial nerves II through XII intact, motor strength is 5/5 in right deltoid, bicep, tricep, grip, hip flexor, knee extensors, ankle dorsiflexor and plantar flexor 3-/5 Left deltoid, 3 - biceps, 2 - triceps , 0/5 grip, 2- HF, KE, 0/5 ADF Sensory exam normal sensation to light touch and proprioception in bilateral upper and lower extremities  Musculoskeletal: reduced left shoulder abduction, neg impingement sign , no Left hand tenderness or swelling   Assessment/Plan: 1. Functional deficits which require 3+ hours per day of interdisciplinary therapy in a comprehensive inpatient rehab setting. Physiatrist is providing close team supervision and 24 hour management of active medical problems listed below. Physiatrist and rehab team continue to assess barriers to discharge/monitor patient progress toward functional and medical  goals  Care Tool:  Bathing    Body parts bathed by patient: Right arm, Chest, Abdomen, Right upper leg, Left upper leg, Face, Left lower leg, Front perineal area, Buttocks   Body parts bathed by helper: Left arm, Right lower leg     Bathing assist Assist Level: Minimal Assistance - Patient > 75%     Upper Body Dressing/Undressing Upper body dressing   What is the patient wearing?: Pull over shirt    Upper body assist Assist Level: Supervision/Verbal cueing    Lower Body Dressing/Undressing Lower body dressing      What is the patient wearing?: Pants     Lower body assist Assist for lower body dressing: Moderate Assistance - Patient 50 - 74%     Toileting Toileting    Toileting assist Assist for toileting: Maximal Assistance - Patient 25 - 49%     Transfers Chair/bed transfer  Transfers assist     Chair/bed transfer assist level: Minimal Assistance - Patient > 75% Chair/bed transfer assistive device: Armrests   Locomotion Ambulation   Ambulation assist      Assist level: Moderate Assistance - Patient 50 - 74% Assistive device: Other (comment) (R hallway rail intermittently) Max distance: 174f   Walk 10 feet activity   Assist     Assist level: Moderate Assistance - Patient - 50 - 74% Assistive  device: Other (comment) (R hallway rail intermittently)   Walk 50 feet activity   Assist Walk 50 feet with 2 turns activity did not occur: Safety/medical concerns         Walk 150 feet activity   Assist Walk 150 feet activity did not occur: Safety/medical concerns         Walk 10 feet on uneven surface  activity   Assist Walk 10 feet on uneven surfaces activity did not occur: Safety/medical concerns         Wheelchair     Assist Will patient use wheelchair at discharge?:  (TBD)      Wheelchair assist level: Dependent - Patient 0% Max wheelchair distance: 150'    Wheelchair 50 feet with 2 turns activity    Assist         Assist Level: Dependent - Patient 0%   Wheelchair 150 feet activity     Assist      Assist Level: Dependent - Patient 0%   Blood pressure 137/77, pulse 64, temperature 97.9 F (36.6 C), resp. rate 17, height '5\' 4"'$  (1.626 m), weight 71.4 kg, SpO2 96 %.  Medical Problem List and Plan: 1.  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  Antithrombotics: -DVT/anticoagulation:  Pharmaceutical: Lovenox             -antiplatelet therapy: ASA/Brillinta 3. Pain Management: Tylenol prn.  4. Mood: LCSW to follow for evaluation and support.              -antipsychotic agents: N/A 5. Neuropsych: This patient is capable of making decisions on her own behalf. 6. Skin/Wound Care: Routine pressure relief measures.  7. Fluids/Electrolytes/Nutrition: Monitor I/O.Heart healthy diet. 8.  NSTEMI s/p PCI/DES: On ASA/Brillinta 9. Chronic combined CHF: Monitor for signs of overload. Check weights daily             --On Entresto, toprol XL Spironolactone and Crestor.             --SBP goal 130-150 to prevent cerebral hypoperfusion. Vitals:   10/18/20 1922 10/19/20 0415  BP: 136/86 137/77  Pulse: 78 64  Resp: 19 17  Temp: 98.4 F (36.9 C) 97.9 F (36.6 C)  SpO2: 99% 96%  Controlled but goal is higher will reduce toprol XL to 12.'5mg'$  on 7/23 monitor , off spironolactone due to BP low - improving but tizanidine may lower will monitor  10. T2DM: Hgb A1C-6.9. Monitor BS ac/hs. 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 Anemia: Likely due to DAPT/Lovenox. Reported to have some pink vaginal discharge--vaginally v/s bladder source?             --monitor for signs of bleeding. Monitor serial H/H. 12. Constipation: BM  yesterday. Will add Senna and warm prune juice. Does not want to try anything stronger for now. Decrease Senna to 1 tab HS 13. Hypokalemia: supplement 38mq today, monitor K+ weekly.      LOS: 10 days A FACE TO FACE EVALUATION WAS PERFORMED  ACharlett Blake7/29/2022, 9:00 AM

## 2020-10-19 NOTE — Progress Notes (Signed)
Physical Therapy Session Note  Patient Details  Name: Tracey Meyer MRN: [id number] Date of Birth: [birth year]  Today's Date: 10/20/2020 PT Individual Time: 1417-1535  PT Individual Time Calculation (min): 78 min    Short Term Goals: Week 1:  PT Short Term Goal 1 (Week 1): Pt will complete least restrictive transfer with min A consistently PT Short Term Goal 1 - Progress (Week 1): Progressing toward goal PT Short Term Goal 2 (Week 1): Pt will ambulate with assist x 1 consistently PT Short Term Goal 2 - Progress (Week 1): Met PT Short Term Goal 3 (Week 1): Pt will initiate stair training PT Short Term Goal 3 - Progress (Week 1): Progressing toward goal Week 2:  PT Short Term Goal 1 (Week 2): Pt will perform supine<>sit with CGA PT Short Term Goal 2 (Week 2): Pt will perform sit<>stands using LRAD with CGA PT Short Term Goal 3 (Week 2): Pt will perform bed<>chair transfers using LRAD with min assist consistently PT Short Term Goal 4 (Week 2): Pt will ambulate at least 26f using LRAD with no more than mod assist of 1 PT Short Term Goal 5 (Week 2): Pt will initiate stair navigation training   Skilled Therapeutic Interventions/Progress Updates:  Patient semireclined in bed on entrance to room. Patient alert and agreeable to PT session. Patient denied pain during session.  Therapeutic Activity: Bed Mobility: Patient performed supine --> sit with Min A. VC/ tc required for increasing effort UB to reach upright seated position. Transfers: Patient performed STS and SPVT transfers throughout session with Min A. Provided verbal cues for forward lean, extensor push through BLE.  Neuromuscular Re-ed: NMR facilitated during session with focus on extensor muscle activation/ facilitation, motor control, trunk control and balance. Pt guided in tall kneeling activity. Demonstrated to pt how to get to desired position on mat table with stable bench surface in front of pt on mat. Pt requires Min/ mod  A to attain tall kneeling position on mat with most assist for LLE. Initially pt guided in positioning into hip and trunk neutral extension. Education provided re: pt's current movement patterns during standing mobility including gait and compared to more neutral posturing during gait. Pt then progressed to sitting to heels and extensor rise to tall kneel position with UE on bench. Assist provided to reach upright and full hip extension with hands on bench. Pt able to perform 2x5 reps, then 110 reps with brief rest between. NMR performed for improvements in motor control and coordination, balance, sequencing, judgement, and self confidence/ efficacy in performing all aspects of mobility at highest level of independence.   Gait Training:  Pt educated on AFO and potential need for upcoming assessment in order to determine best fit for personal needs in ambulation. Pt shown examples and she is unable to accurately describe her knee instability. With availability in sizing of examples, sprystep GRAFO chosen which is determined to be too rigid and without support to prevent uncontrolled TKE. However, gait improved with use. Patient ambulated 339 x1/ 850 x1 using RHR with intermittent no UE support and  Mod A for balance and intermittent block of TKE as well as intermittent foot positioning as pt tending to ER foot/ hip upon heel strike. During second bout, pt demos some improvement in hip extension bilaterally. Provided vc/ tc throughout for hip extension/ forward push of pelvis at terminal swing through and stance phases, control of R knee extension/ flexion, upright posture, level gaze, weight shift.   Patient seated  upright  in w/c at end of session with brakes locked, belt alarm set, and all needs within reach. Tray table in front of pt. Pt states she did not get to perform on NuStep (which she enjoyed in previous day's session) - note left for next therapist.      Therapy Documentation Precautions:   Precautions Precautions: Fall Precaution Comments: L hemiparesis Restrictions Weight Bearing Restrictions: No  Therapy/Group: Individual Therapy  Alger Simons PT, DPT 10/19/2020, 6:41 PM

## 2020-10-19 NOTE — Progress Notes (Signed)
Physical Therapy Session Note  Patient Details  Name: Tracey Meyer MRN: [id number] Date of Birth: 07-08-1960  Today's Date: 10/19/2020 PT Individual Time: [id number] PT Individual Time Calculation (min): 26 min   Short Term Goals: Week 2:  PT Short Term Goal 1 (Week 2): Pt will perform supine<>sit with CGA PT Short Term Goal 2 (Week 2): Pt will perform sit<>stands using LRAD with CGA PT Short Term Goal 3 (Week 2): Pt will perform bed<>chair transfers using LRAD with min assist consistently PT Short Term Goal 4 (Week 2): Pt will ambulate at least 60f using LRAD with no more than mod assist of 1 PT Short Term Goal 5 (Week 2): Pt will initiate stair navigation training  Skilled Therapeutic Interventions/Progress Updates:    Patient in w/c in room and reports pain in buttocks wants to stand.  Patient sit to stand min A and pivot to EOB with min A.  Seated to don ace wrap on L LE.  Patient ambulated x 60' out of room and to end of hallway and back with RW L hand splint and L ace wrap for DF assist on LE.  Needed mod A for L LE initiation for swing and facilitation for L lateral weight shift in stance.  Patient cued throughout for postural correction due to R lateral and forward lean at times.  Assisted sit to supine with min A for L LE.  Patient repositioned per her request with pillows as she directed.  Left with call bell and needs in reach and bed alarm active.   Therapy Documentation Precautions:  Precautions Precautions: Fall Precaution Comments: L hemiparesis Restrictions Weight Bearing Restrictions: No Pain: Pain Assessment Faces Pain Scale: Hurts little more Pain Type: Acute pain Pain Location: Buttocks Pain Orientation: Medial Pain Descriptors / Indicators: Discomfort Pain Onset: Progressive Pain Intervention(s): Repositioned;Ambulation/increased activity    Therapy/Group: Individual Therapy  CReginia NaasC[place] PVirgin[place]7/29/2022, 6:11 PM

## 2020-10-19 NOTE — Progress Notes (Signed)
Occupational Therapy Session Note  Patient Details  Name: Tracey Meyer MRN: [id number] Date of Birth: 29-Jan-1961  Today's Date: 10/19/2020 OT Individual Time: [id number] OT Individual Time Calculation (min): 54 min    Short Term Goals: Week 2:  OT Short Term Goal 1 (Week 2): Pt will complete LB dressing with min assist sit to stand for underpants and pants only. OT Short Term Goal 2 (Week 2): Pt will complete stand pivot transfer to the 3:1 with min assist for two consecutive sessions. OT Short Term Goal 3 (Week 2): Pt will use the LUE during bathing tasks with min assist for washing the RUE and the L upper leg. OT Short Term Goal 4 (Week 2): Pt will complete toileting tasks with min assist for 2 consecutive sessions.  Skilled Therapeutic Interventions/Progress Updates:    Pt in wheelchair to start session, agreeable to completion of bathing and dressing.  She was able to ambulate to the shower seat with mod assist and no device.  Increased trunk flexion with increased left knee hyperextension noted with weightbearing.  Mod facilitation to advance the LLE secondary to scissoring.  Next, she was able to undress with min assist and then completed all bathing with min assist sit to stand.  Min assist for transfer to the wheelchair for dressing at the sink.  She was able to complete UB dressing with min assist and then LB dressing with mod assist sit to stand for crossing the LLE over the right knee.  Mod assist for gripper socks with supervision for oral hygiene in sitting.  She was left sitting in the wheelchair with the call button and phone in reach and safety alarm belt in place.    Therapy Documentation Precautions:  Precautions Precautions: Fall Precaution Comments: L hemiparesis Restrictions Weight Bearing Restrictions: No    Pain: Pain Assessment Pain Scale: 0-10 Pain Score: 0-No pain ADL: See Care Tool Section for some details of mobility and selfcare   Therapy/Group:  Individual Therapy  Fateh Kindle OTR/L 10/19/2020, 11:35 AM

## 2020-10-19 NOTE — Progress Notes (Signed)
Speech Language Pathology Daily Session Note  Patient Details  Name: Tracey Meyer MRN: [id number] Date of Birth: 1961/02/18  Today's Date: 10/19/2020 SLP Individual Time: [id number] SLP Individual Time Calculation (min): 45 min  Short Term Goals: Week 2: SLP Short Term Goal 1 (Week 2): Patient will utilize speech intelligibility strategies at the sentence level to maximize intelligibility and decrease shortness of breath with overall Sup A verbal cues. SLP Short Term Goal 2 (Week 2): Patient will demosntrate complex problem solving for functional tasks with supervision verbal cues. SLP Short Term Goal 3 (Week 2): Patient will self-monitor and correct erorrs during complex tasks with supervision verbal cues. SLP Short Term Goal 4 (Week 2): Patient will utilize memory compensatory strategies to recall functional information with Sup verbal and visual cues.  Skilled Therapeutic Interventions: Patient agreeable to skilled ST intervention with focus on cognitive goals. SLP facilitated verbal reasoning/problem solving task with word scramble-like activity. Patient completed with min A verbal/visual cues and additional processing time. Intermittent verbal cues provided to enhance speech intelligibility including increasing breath support for improved vocal intensity.  Patient was left in bed with alarm activated and immediate needs within reach at end of session. Continue per current plan of care.      Pain Pain Assessment Pain Scale: 0-10 Pain Score: 0-No pain  Therapy/Group: Individual Therapy  Patty Sermons 10/19/2020, 12:47 PM

## 2020-10-20 ENCOUNTER — Inpatient Hospital Stay (HOSPITAL_COMMUNITY): Payer: Medicaid Other

## 2020-10-20 LAB — GLUCOSE, CAPILLARY
Glucose-Capillary: 110 mg/dL — ABNORMAL HIGH (ref 70–99)
Glucose-Capillary: 117 mg/dL — ABNORMAL HIGH (ref 70–99)
Glucose-Capillary: 119 mg/dL — ABNORMAL HIGH (ref 70–99)
Glucose-Capillary: 142 mg/dL — ABNORMAL HIGH (ref 70–99)

## 2020-10-20 NOTE — Progress Notes (Signed)
Physical Therapy Session Note  Patient Details  Name: Tracey Meyer MRN: [id number] Date of Birth: Mar 18, 1961  Today's Date: 10/20/2020 PT Individual Time: [id number] and [id number] PT Individual Time Calculation (min): 44 min and 30 min  Short Term Goals: Week 2:  PT Short Term Goal 1 (Week 2): Pt will perform supine<>sit with CGA PT Short Term Goal 2 (Week 2): Pt will perform sit<>stands using LRAD with CGA PT Short Term Goal 3 (Week 2): Pt will perform bed<>chair transfers using LRAD with min assist consistently PT Short Term Goal 4 (Week 2): Pt will ambulate at least 30f using LRAD with no more than mod assist of 1 PT Short Term Goal 5 (Week 2): Pt will initiate stair navigation training  Skilled Therapeutic Interventions/Progress Updates:    Session 1: Pt received sitting in w/c and agreeable to therapy session. Pt appears to be in a down mood and states she is upset with some things at home, but prefers not to discuss it at this time. Transported to/from gym in w/c for time management and energy conservation. Donned L LE DF assist ACE wrap, still with heel wedge in shoe. Sit>stands CGA during session with cuing to push up with L UE from w/c armrest (therapist facilitating grasp) and to lean trunk towards L to promote UE WBing. Gait training next to R hallway rail for safety but therapist preventing pt from using R UE at least 90% of the time - [id number] and [id number] - requires mod assist of 1 with focus of cuing on sustained hip extension and upright posture with sufficient R weight shift onto stance limb to allow L LE advancement in swing with min/mod assist to advance through L LE extensor tone - requires facilitation at L hip during L stance to maintain hip extension and L weight shift - pt demos significant improvement in gait mechanics today with sustained upright posture and reciprocal stepping pattern. Pt transported back to room and left in the care of NT.  Session 2: Pt received  sitting in w/c and agreeable to therapy session.  Transported to/from gym in w/c for time management and energy conservation. Donned L LE DF assist ACE wrap (pt still with heel wedge in shoe). Gait training ~[id number] +2 assist on pt's R side for safety (moved away from the wall) and mod assist from therapist for balance and L LE gait mechanics - continues to require min/mod assist for L LE swing phase advancement with cuing for increased R weight shift to allow L swing; requires min guarding/blocking of L knee extensor thrust as long as promoting increased L hip extension and upright posture with L weight shift onto stance limb (but intermittently will still have L knee extensor thrust that causes forward lunge of trunk requiring increased assist for balance and use of R UE support). Stair navigation training ascending/descending 4 steps 2x using R UE support (attempted to facilitate L UE support but difficult to sustain grasp) - started with cuing for step-to pattern leading with R LE on ascent and L LE on descent progressed to reciprocal pattern with pt performing all with min/mod assist of 1 (+2 present for safety because available). Transported back to room and pt requesting to return to bed. Short distance ~5ft 29ft w/c>EOB, no UE support, with mod assist of 1 for balance - continued cuing for R/L weight shift onto stance limb and to maintain hip extension for upright posture. Sit>supine with CGA for L LE management onto bed. Pt  left with needs in reach and bed alarm on.  Therapy Documentation Precautions:  Precautions Precautions: Fall Precaution Comments: L hemiparesis Restrictions Weight Bearing Restrictions: No   Pain:  Session 1: No reports of pain throughout session.  Session 2: No reports of pain throughout session.   Therapy/Group: Individual Therapy  Tawana Scale , PT, DPT, NCS, CSRS 10/20/2020, 7:48 AM

## 2020-10-20 NOTE — Progress Notes (Signed)
PROGRESS NOTE   Subjective/Complaints: Patient very proud of her progress with ambulation- 270 feet! Discussed that we can stop her Lovenox injections and she is very pleased to hear this.  Asks why she is having shortness of breath at times.   ROS- Denies CP, N/V/D, +shortness of breath   Objective:   No results found. No results for input(s): WBC, HGB, HCT, PLT in the last 72 hours.  No results for input(s): NA, K, CL, CO2, GLUCOSE, BUN, CREATININE, CALCIUM in the last 72 hours.   Intake/Output Summary (Last 24 hours) at 10/20/2020 1348 Last data filed at 10/20/2020 1340 Gross per 24 hour  Intake 480 ml  Output --  Net 480 ml        Physical Exam: Vital Signs Blood pressure 128/80, pulse 74, temperature 98.2 F (36.8 C), temperature source Oral, resp. rate 17, height '5\' 4"'$  (1.626 m), weight 71.4 kg, SpO2 100 %. Gen: no distress, normal appearing HEENT: oral mucosa pink and moist, NCAT Cardio: Reg rate Chest: normal effort, normal rate of breathing Abd: soft, non-distended Ext: no edema Psych: pleasant, normal affect Skin: No evidence of breakdown, no evidence of rash Neurologic: Cranial nerves II through XII intact, motor strength is 5/5 in right deltoid, bicep, tricep, grip, hip flexor, knee extensors, ankle dorsiflexor and plantar flexor 3-/5 Left deltoid, 3 - biceps, 2 - triceps , 0/5 grip, 2- HF, KE, 0/5 ADF Sensory exam normal sensation to light touch and proprioception in bilateral upper and lower extremities  Musculoskeletal: reduced left shoulder abduction, neg impingement sign , no Left hand tenderness or swelling   Assessment/Plan: 1. Functional deficits which require 3+ hours per day of interdisciplinary therapy in a comprehensive inpatient rehab setting. Physiatrist is providing close team supervision and 24 hour management of active medical problems listed below. Physiatrist and rehab team continue  to assess barriers to discharge/monitor patient progress toward functional and medical goals  Care Tool:  Bathing    Body parts bathed by patient: Right arm, Chest, Abdomen, Right upper leg, Left upper leg, Face, Left lower leg, Front perineal area, Buttocks   Body parts bathed by helper: Left arm, Right lower leg     Bathing assist Assist Level: Minimal Assistance - Patient > 75%     Upper Body Dressing/Undressing Upper body dressing   What is the patient wearing?: Pull over shirt    Upper body assist Assist Level: Minimal Assistance - Patient > 75%    Lower Body Dressing/Undressing Lower body dressing      What is the patient wearing?: Pants     Lower body assist Assist for lower body dressing: Moderate Assistance - Patient 50 - 74%     Toileting Toileting    Toileting assist Assist for toileting: Maximal Assistance - Patient 25 - 49%     Transfers Chair/bed transfer  Transfers assist     Chair/bed transfer assist level: Minimal Assistance - Patient > 75% Chair/bed transfer assistive device: Armrests   Locomotion Ambulation   Ambulation assist      Assist level: Moderate Assistance - Patient 50 - 74% Assistive device: Walker-rolling (and L hand splint) Max distance: 69'   Walk 10  feet activity   Assist     Assist level: Moderate Assistance - Patient - 50 - 74% Assistive device: Walker-rolling, Orthosis   Walk 50 feet activity   Assist Walk 50 feet with 2 turns activity did not occur: Safety/medical concerns  Assist level: Moderate Assistance - Patient - 50 - 74% Assistive device: Walker-rolling, Orthosis    Walk 150 feet activity   Assist Walk 150 feet activity did not occur: Safety/medical concerns         Walk 10 feet on uneven surface  activity   Assist Walk 10 feet on uneven surfaces activity did not occur: Safety/medical concerns         Wheelchair     Assist Will patient use wheelchair at discharge?:  (TBD)       Wheelchair assist level: Dependent - Patient 0% Max wheelchair distance: 150'    Wheelchair 50 feet with 2 turns activity    Assist        Assist Level: Dependent - Patient 0%   Wheelchair 150 feet activity     Assist      Assist Level: Dependent - Patient 0%   Blood pressure 128/80, pulse 74, temperature 98.2 F (36.8 C), temperature source Oral, resp. rate 17, height '5\' 4"'$  (1.626 m), weight 71.4 kg, SpO2 100 %.  Medical Problem List and Plan: 1.  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  Impaired mobility, ambulating >200 feet, d/c lovenox             -antiplatelet therapy: ASA/Brillinta 3. Pain Management: Tylenol prn.  4. Mood: LCSW to follow for evaluation and support.              -antipsychotic agents: N/A 5. Neuropsych: This patient is capable of making decisions on her own behalf. 6. Skin/Wound Care: Routine pressure relief measures.  7. Fluids/Electrolytes/Nutrition: Monitor I/O.Heart healthy diet. 8.  NSTEMI s/p PCI/DES: On ASA/Brillinta 9. Chronic combined CHF: Monitor for signs of overload. Check weights daily             --Continue Entresto, toprol XL Spironolactone and Crestor.             --SBP goal 130-150 to prevent cerebral hypoperfusion. Vitals:   10/20/20 0334 10/20/20 0831  BP: 126/81 128/80  Pulse: 75 74  Resp: 17   Temp: 98.2 F (36.8 C)   SpO2: 100%   Controlled but goal is higher will reduce toprol XL to 12.'5mg'$  on 7/23 monitor , off spironolactone due to BP low - improving but tizanidine may lower will monitor  10. T2DM: Hgb A1C-6.9. Monitor BS ac/hs. 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 Anemia: Likely due to DAPT/Lovenox. Reported to have  some pink vaginal discharge--vaginally v/s bladder source?             --monitor for signs of bleeding. Monitor serial H/H. 12. Constipation: BM yesterday. Will add Senna and warm prune juice. Does not want to try anything stronger for now. Decrease Senna to 1 tab HS 13. Hypokalemia: supplement 61mq today, monitor K+ weekly.  14. Intermittent SOB: Ordered incentive spirometer  to improve breathing strength and ability over time. Provided incentive spirometer education. Will check CXR (last 7/7 with no active disease).      LOS: 11 days A FACE TO FACE EVALUATION WAS PERFORMED  Kyaire Gruenewald P Abriella Filkins 10/20/2020, 1:48 PM

## 2020-10-20 NOTE — Plan of Care (Signed)
  Problem: RH BOWEL ELIMINATION Goal: RH STG MANAGE BOWEL WITH ASSISTANCE Description: STG Manage Bowel with mod I Assistance. Outcome: Progressing Goal: RH STG MANAGE BOWEL W/MEDICATION W/ASSISTANCE Description: STG Manage Bowel with Medication with mod I Assistance. Outcome: Progressing   Problem: RH SAFETY Goal: RH STG ADHERE TO SAFETY PRECAUTIONS W/ASSISTANCE/DEVICE Description: STG Adhere to Safety Precautions With cues/reminders Assistance/Device. Outcome: Progressing   Problem: RH KNOWLEDGE DEFICIT Goal: RH STG INCREASE KNOWLEDGE OF DIABETES Description: Patient and significant other will be able to manage DM with medications and dietary modifications using handouts and educational resources with cues/reminders Outcome: Progressing Goal: RH STG INCREASE KNOWLEDGE OF HYPERTENSION Description: Patient and significant other will be able to manage HTN with medications and dietary modifications using handouts and educational resources with cues/reminders Outcome: Progressing Goal: RH STG INCREASE KNOWLEGDE OF HYPERLIPIDEMIA Description: Patient and significant other will be able to manage HLD with medications and dietary modifications using handouts and educational resources with cues/reminders Outcome: Progressing Goal: RH STG INCREASE KNOWLEDGE OF STROKE PROPHYLAXIS Description: Patient and significant other will be able to manage secondary stroke risks medications and dietary modifications using handouts and educational resources with cues/reminders Outcome: Progressing   Problem: Consults Goal: RH STROKE PATIENT EDUCATION Description: See Patient Education module for education specifics  Outcome: Progressing

## 2020-10-21 LAB — GLUCOSE, CAPILLARY
Glucose-Capillary: 114 mg/dL — ABNORMAL HIGH (ref 70–99)
Glucose-Capillary: 148 mg/dL — ABNORMAL HIGH (ref 70–99)
Glucose-Capillary: 124 mg/dL — ABNORMAL HIGH (ref 70–99)
Glucose-Capillary: 148 mg/dL — ABNORMAL HIGH (ref 70–99)

## 2020-10-21 NOTE — Plan of Care (Signed)
  Problem: RH BOWEL ELIMINATION Goal: RH STG MANAGE BOWEL WITH ASSISTANCE Description: STG Manage Bowel with mod I Assistance. Outcome: Progressing Goal: RH STG MANAGE BOWEL W/MEDICATION W/ASSISTANCE Description: STG Manage Bowel with Medication with mod I Assistance. Outcome: Progressing   Problem: RH SAFETY Goal: RH STG ADHERE TO SAFETY PRECAUTIONS W/ASSISTANCE/DEVICE Description: STG Adhere to Safety Precautions With cues/reminders Assistance/Device. Outcome: Progressing   Problem: RH KNOWLEDGE DEFICIT Goal: RH STG INCREASE KNOWLEDGE OF DIABETES Description: Patient and significant other will be able to manage DM with medications and dietary modifications using handouts and educational resources with cues/reminders Outcome: Progressing Goal: RH STG INCREASE KNOWLEDGE OF HYPERTENSION Description: Patient and significant other will be able to manage HTN with medications and dietary modifications using handouts and educational resources with cues/reminders Outcome: Progressing Goal: RH STG INCREASE KNOWLEGDE OF HYPERLIPIDEMIA Description: Patient and significant other will be able to manage HLD with medications and dietary modifications using handouts and educational resources with cues/reminders Outcome: Progressing Goal: RH STG INCREASE KNOWLEDGE OF STROKE PROPHYLAXIS Description: Patient and significant other will be able to manage secondary stroke risks medications and dietary modifications using handouts and educational resources with cues/reminders Outcome: Progressing   Problem: Consults Goal: RH STROKE PATIENT EDUCATION Description: See Patient Education module for education specifics  Outcome: Progressing

## 2020-10-21 NOTE — Progress Notes (Signed)
PROGRESS NOTE   Subjective/Complaints: No complaints today Discussed CXR stable.    ROS- Denies CP, N/V/D, +shortness of breath   Objective:   DG Chest 2 View  Result Date: 10/20/2020 CLINICAL DATA:  Recent history of stroke and heart attack. EXAM: CHEST - 2 VIEW COMPARISON:  September 27, 2020 FINDINGS: Stable cardiomegaly. The hila, mediastinum, lungs, and pleura are otherwise unremarkable. IMPRESSION: No active cardiopulmonary disease. Electronically Signed   By: Dorise Bullion III M.D   On: 10/20/2020 20:34   No results for input(s): WBC, HGB, HCT, PLT in the last 72 hours.  No results for input(s): NA, K, CL, CO2, GLUCOSE, BUN, CREATININE, CALCIUM in the last 72 hours.   Intake/Output Summary (Last 24 hours) at 10/21/2020 1656 Last data filed at 10/21/2020 1306 Gross per 24 hour  Intake 720 ml  Output --  Net 720 ml        Physical Exam: Vital Signs Blood pressure 120/61, pulse 68, temperature 98.3 F (36.8 C), temperature source Oral, resp. rate 17, height '5\' 4"'$  (1.626 m), weight 71.4 kg, SpO2 100 %. Gen: no distress, normal appearing HEENT: oral mucosa pink and moist, NCAT Cardio: Reg rate Chest: normal effort, normal rate of breathing Abd: soft, non-distended Ext: no edema Psych: pleasant, normal affect  Skin: No evidence of breakdown, no evidence of rash Neurologic: Cranial nerves II through XII intact, motor strength is 5/5 in right deltoid, bicep, tricep, grip, hip flexor, knee extensors, ankle dorsiflexor and plantar flexor 3-/5 Left deltoid, 3 - biceps, 2 - triceps , 0/5 grip, 2- HF, KE, 0/5 ADF Sensory exam normal sensation to light touch and proprioception in bilateral upper and lower extremities  Musculoskeletal: reduced left shoulder abduction, neg impingement sign , no Left hand tenderness or swelling   Assessment/Plan: 1. Functional deficits which require 3+ hours per day of interdisciplinary  therapy in a comprehensive inpatient rehab setting. Physiatrist is providing close team supervision and 24 hour management of active medical problems listed below. Physiatrist and rehab team continue to assess barriers to discharge/monitor patient progress toward functional and medical goals  Care Tool:  Bathing    Body parts bathed by patient: Right arm, Chest, Abdomen, Right upper leg, Left upper leg, Face, Left lower leg, Front perineal area, Buttocks   Body parts bathed by helper: Left arm, Right lower leg     Bathing assist Assist Level: Minimal Assistance - Patient > 75%     Upper Body Dressing/Undressing Upper body dressing   What is the patient wearing?: Pull over shirt    Upper body assist Assist Level: Minimal Assistance - Patient > 75%    Lower Body Dressing/Undressing Lower body dressing      What is the patient wearing?: Pants     Lower body assist Assist for lower body dressing: Moderate Assistance - Patient 50 - 74%     Toileting Toileting    Toileting assist Assist for toileting: Maximal Assistance - Patient 25 - 49%     Transfers Chair/bed transfer  Transfers assist     Chair/bed transfer assist level: Minimal Assistance - Patient > 75% Chair/bed transfer assistive device: Armrests   Locomotion Ambulation  Ambulation assist      Assist level: Moderate Assistance - Patient 50 - 74% Assistive device: Other (comment) (by R hallway rail for safety) Max distance: 169f   Walk 10 feet activity   Assist     Assist level: Moderate Assistance - Patient - 50 - 74% Assistive device: Walker-rolling, Orthosis   Walk 50 feet activity   Assist Walk 50 feet with 2 turns activity did not occur: Safety/medical concerns  Assist level: Moderate Assistance - Patient - 50 - 74% Assistive device: Walker-rolling, Orthosis    Walk 150 feet activity   Assist Walk 150 feet activity did not occur: Safety/medical concerns         Walk 10 feet  on uneven surface  activity   Assist Walk 10 feet on uneven surfaces activity did not occur: Safety/medical concerns         Wheelchair     Assist Will patient use wheelchair at discharge?:  (TBD)      Wheelchair assist level: Dependent - Patient 0% Max wheelchair distance: 150'    Wheelchair 50 feet with 2 turns activity    Assist        Assist Level: Dependent - Patient 0%   Wheelchair 150 feet activity     Assist      Assist Level: Dependent - Patient 0%   Blood pressure 120/61, pulse 68, temperature 98.3 F (36.8 C), temperature source Oral, resp. rate 17, height '5\' 4"'$  (1.626 m), weight 71.4 kg, SpO2 100 %.  Medical Problem List and Plan: 1.  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  Impaired mobility, ambulating >200 feet, d/c lovenox             -antiplatelet therapy: ASA/Brillinta 3. Pain Management: Tylenol prn.  4. Mood: LCSW to follow for evaluation and support.              -antipsychotic agents: N/A 5. Neuropsych: This patient is capable of making decisions on her own behalf. 6. Skin/Wound Care: Routine pressure relief measures.  7. Fluids/Electrolytes/Nutrition: Monitor I/O.Heart healthy diet. 8.  NSTEMI s/p PCI/DES: On ASA/Brillinta 9. Chronic combined CHF: Monitor for signs of overload. Check weights daily             --Continue Entresto, toprol XL Spironolactone and Crestor.             --SBP goal 130-150 to prevent cerebral hypoperfusion. Vitals:   10/21/20 0441 10/21/20 1319  BP: (!) 144/77 120/61  Pulse: 73 68  Resp: 18 17  Temp: 98 F (36.7 C) 98.3 F (36.8 C)  SpO2: 100% 100%  Controlled but goal is higher will reduce toprol XL to 12.'5mg'$  on 7/23 monitor , off spironolactone due to BP low - improving but tizanidine may lower will monitor  10. T2DM: Hgb A1C-6.9. Monitor BS ac/hs. 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   -- Continue metformin bid (recently added)  11. Anemia: Likely due to DAPT/Lovenox. Reported to have some pink vaginal discharge--vaginally v/s bladder source?             --monitor for signs of bleeding. Monitor serial H/H.  12. Constipation: BM yesterday. Continue Senna and warm prune juice. Does not want to try anything stronger for now. Decrease Senna to 1 tab HS 13. Hypokalemia: supplement 57mq today, monitor K+ weekly.  14. Intermittent SOB: Ordered incentive spirometer to improve breathing strength and ability over time. Provided incentive spirometer education. CXR obtained and stable- discussed with patient.      LOS: 12 days A FACE TO FACE EVALUATION WAS PERFORMED  KClide DeutscherRaulkar 10/21/2020, 4:56 PM

## 2020-10-22 LAB — GLUCOSE, CAPILLARY
Glucose-Capillary: 147 mg/dL — ABNORMAL HIGH (ref 70–99)
Glucose-Capillary: 145 mg/dL — ABNORMAL HIGH (ref 70–99)
Glucose-Capillary: 133 mg/dL — ABNORMAL HIGH (ref 70–99)
Glucose-Capillary: 101 mg/dL — ABNORMAL HIGH (ref 70–99)

## 2020-10-22 LAB — BASIC METABOLIC PANEL
Anion gap: 8 (ref 5–15)
BUN: 13 mg/dL (ref 6–20)
CO2: 22 mmol/L (ref 22–32)
Calcium: 9.5 mg/dL (ref 8.9–10.3)
Chloride: 108 mmol/L (ref 98–111)
Creatinine, Ser: 1.27 mg/dL — ABNORMAL HIGH (ref 0.44–1.00)
GFR, Estimated: 48 mL/min — ABNORMAL LOW (ref >60)
Glucose, Bld: 127 mg/dL — ABNORMAL HIGH (ref 70–99)
Potassium: 3.6 mmol/L (ref 3.5–5.1)
Sodium: 138 mmol/L (ref 135–145)

## 2020-10-22 LAB — CBC
HCT: 33.5 % — ABNORMAL LOW (ref 36.0–46.0)
Hemoglobin: 11.2 g/dL — ABNORMAL LOW (ref 12.0–15.0)
MCH: 30.7 pg (ref 26.0–34.0)
MCHC: 33.4 g/dL (ref 30.0–36.0)
MCV: 91.8 fL (ref 80.0–100.0)
Platelets: 302 10*3/uL (ref 150–400)
RBC: 3.65 MIL/uL — ABNORMAL LOW (ref 3.87–5.11)
RDW: 12.8 % (ref 11.5–15.5)
WBC: 5.8 10*3/uL (ref 4.0–10.5)
nRBC: 0 % (ref 0.0–0.2)

## 2020-10-22 NOTE — Progress Notes (Signed)
Occupational Therapy Session Note  Patient Details  Name: Tracey Meyer MRN: [id number] Date of Birth: 07-31-1960  Today's Date: 10/22/2020 OT Group Time:  -      Short Term Goals: Week 2:  OT Short Term Goal 1 (Week 2): Pt will complete LB dressing with min assist sit to stand for underpants and pants only. OT Short Term Goal 2 (Week 2): Pt will complete stand pivot transfer to the 3:1 with min assist for two consecutive sessions. OT Short Term Goal 3 (Week 2): Pt will use the LUE during bathing tasks with min assist for washing the RUE and the L upper leg. OT Short Term Goal 4 (Week 2): Pt will complete toileting tasks with min assist for 2 consecutive sessions.  Skilled Therapeutic Interventions/Progress Updates:  Pt was seen for skilled group session with focus of group session on stress management, coping strategies and social interaction. Pt reports pain in buttock, assisted pt with stand pivot transfer from w/c> EOM with MIN A. Pt actively participating in group during session, sharing frequently during session.  Education provided on importance of attempting to chunk stressors into categories such as "daily hassles" "major life events" and "life circumstances" in an effort to better delegate pts attention to stressors. Education provided on using "daily uplifts", "coping strategies" and "protective factors" to manage  stressors. Pt receptive to all education and reports appreciation of session, asking "is this every week?"  Handout provided on coping strategies to increase carryover. Pt transported back to room by RT.   Therapy Documentation Precautions:  Precautions Precautions: Fall Precaution Comments: L hemiparesis Restrictions Weight Bearing Restrictions: No  Pain:  Pt reports  pain in pts buttock from sitting in w/c, assisted pt with transfer to mat table for pain mgmt.    Therapy/Group: Group Therapy  [name] 10/22/2020, 4:23 PM

## 2020-10-22 NOTE — Progress Notes (Signed)
Physical Therapy Session Note  Patient Details  Name: Tracey Meyer MRN: [id number] Date of Birth: 12-Dec-1960  Today's Date: 10/22/2020 PT Individual Time: 1515-1600 PT Individual Time Calculation (min): 45 min   Short Term Goals: Week 2:  PT Short Term Goal 1 (Week 2): Pt will perform supine<>sit with CGA PT Short Term Goal 2 (Week 2): Pt will perform sit<>stands using LRAD with CGA PT Short Term Goal 3 (Week 2): Pt will perform bed<>chair transfers using LRAD with min assist consistently PT Short Term Goal 4 (Week 2): Pt will ambulate at least 56f using LRAD with no more than mod assist of 1 PT Short Term Goal 5 (Week 2): Pt will initiate stair navigation training  Skilled Therapeutic Interventions/Progress Updates:    Pt received seated in bed, agreeable to PT session. No complaints of pain. Supine to sit with mod A for some LLE management and trunk elevation with use of bedrail and HOB slightly elevated. Stand pivot transfer bed to/from w/c with min A during session. Ambulation x 50 ft with R handrail, L DF assist wrap, assist for LLE management to prevent scissoring, some assist needed for advancement with onset of fatigue, and manual assist to prevent extensor thrust, mod A overall for balance and assist with LLE. Ascend/descend 4 x 6" stairs with R handrail ascending, L handrail descending with min A needed for balance, step-to gait pattern. Pt requires cues for safety and gait pattern on stairs, exhibits some impulsivity. Per pt report she only has R handrail at home, education with patient that she will need to work towards stair management with R handrail prior to d/c but she declines today. Squat pivot transfer back to bed with min A. Sit to supine min A for LLE management. Pt left seated in bed with needs in reach, bed alarm in place.  Therapy Documentation Precautions:  Precautions Precautions: Fall Precaution Comments: L hemiparesis Restrictions Weight Bearing Restrictions:  No   Therapy/Group: Individual Therapy   TExcell Seltzer PT, DPT, CSRS  10/22/2020, 5:07 PM

## 2020-10-22 NOTE — Progress Notes (Signed)
Speech Language Pathology Weekly Progress and Session Note  Patient Details  Name: Tracey Meyer MRN: [id number] Date of Birth: August 22, 1960  Beginning of progress report period: October 16, 2020 End of progress report period: October 22, 2020  Today's Date: 10/22/2020 SLP Individual Time: 1118-1200 SLP Individual Time Calculation (min): 42 min  Short Term Goals: Week 2: SLP Short Term Goal 1 (Week 2): Patient will utilize speech intelligibility strategies at the sentence level to maximize intelligibility and decrease shortness of breath with overall Sup A verbal cues. SLP Short Term Goal 1 - Progress (Week 2): Progressing toward goal SLP Short Term Goal 2 (Week 2): Patient will demosntrate complex problem solving for functional tasks with supervision verbal cues. SLP Short Term Goal 2 - Progress (Week 2): Progressing toward goal SLP Short Term Goal 3 (Week 2): Patient will self-monitor and correct erorrs during complex tasks with supervision verbal cues. SLP Short Term Goal 3 - Progress (Week 2): Met SLP Short Term Goal 4 (Week 2): Patient will utilize memory compensatory strategies to recall functional information with Sup verbal and visual cues. SLP Short Term Goal 4 - Progress (Week 2): Met    New Short Term Goals: Week 3: SLP Short Term Goal 1 (Week 3): Patient will utilize speech intelligibility strategies at the sentence level to maximize intelligibility and decrease shortness of breath with overall Sup A verbal cues. SLP Short Term Goal 2 (Week 3): Patient will demosntrate complex problem solving for functional tasks with supervision verbal cues. SLP Short Term Goal 3 (Week 3): Patient will self-monitor and correct erorrs during complex tasks with mod I. SLP Short Term Goal 4 (Week 3): Patient will utilize memory compensatory strategies to recall functional information with mod I  Weekly Progress Updates: Patient has met 2 of 4 short term goals this reporting period and has made  functional progress with ST over the past week. Patient is currently completing complex cognitive-linguistic tasks with sup-to-min A verbal cues. Min A verbal cues are needed or use of speech intelligibility strategies at the sentence level to achieve 90%+ intelligibility. Patient has demonstrated good carry over of skill session-to-session and improvement in awareness and self monitoring skills. She continues to benefit from additional processing time during complex cognitive tasks and reminders to implement speech intelligibility strategies. Patient continues to tolerate regular diet and thin liquids without difficulty. Patient education is ongoing. Recommend continued skilled ST intervention to maximize cognitive function and functional independence prior to discharge. Continue progression toward established LTGs set at mod I assist level overall.    Intensity: Minumum of 1-2 x/day, 30 to 90 minutes Frequency: 3 to 5 out of 7 days Duration/Length of Stay: 8/12 Treatment/Interventions: Cognitive remediation/compensation;Dysphagia/aspiration precaution training;Internal/external aids;Speech/Language facilitation;Therapeutic Activities;Environmental controls;Cueing hierarchy;Functional tasks;Patient/family education  Daily Session  Skilled Therapeutic Interventions: Patient agreeable to skilled ST intervention with focus on cognitive-communication goals. SLP facilitated moderately complex deductive reasoning tasks with sup-to-min A verbal cues for organization and problem solving. Patient demonstrated improved self monitoring, organization skills, and was observed taking her time more as compared to previous encounter with similar task. SLP reviewed speech intelligibility strategies including over-articulation, decrease speech rate, and increase vocal loudness to enhance speech intelligibility at sentence level. Patient perceived at ~75% intelligible without use of strategies and 90% intelligible with use  at sentence level. Patient was left in bed with alarm activated and immediate needs within reach at end of session. Continue per current plan of care.        General  Pain Pain Assessment Pain Scale: 0-10 Pain Score: 0-No pain  Therapy/Group: Individual Therapy  Patty Sermons 10/22/2020, 4:45 PM

## 2020-10-22 NOTE — Progress Notes (Signed)
PROGRESS NOTE   Subjective/Complaints:   No issues overnite , pt does not think she can go home due to no running water, husband lives at home   ROS- Denies CP, N/V/D, +shortness of breath   Objective:   DG Chest 2 View  Result Date: 10/20/2020 CLINICAL DATA:  Recent history of stroke and heart attack. EXAM: CHEST - 2 VIEW COMPARISON:  September 27, 2020 FINDINGS: Stable cardiomegaly. The hila, mediastinum, lungs, and pleura are otherwise unremarkable. IMPRESSION: No active cardiopulmonary disease. Electronically Signed   By: Dorise Bullion III M.D   On: 10/20/2020 20:34   Recent Labs    10/22/20 0456  WBC 5.8  HGB 11.2*  HCT 33.5*  PLT 302    Recent Labs    10/22/20 0456  NA 138  K 3.6  CL 108  CO2 22  GLUCOSE 127*  BUN 13  CREATININE 1.27*  CALCIUM 9.5     Intake/Output Summary (Last 24 hours) at 10/22/2020 0906 Last data filed at 10/22/2020 0730 Gross per 24 hour  Intake 717 ml  Output --  Net 717 ml         Physical Exam: Vital Signs Blood pressure 134/63, pulse 68, temperature 98.6 F (37 C), resp. rate 17, height '5\' 4"'$  (1.626 m), weight 71.4 kg, SpO2 100 %.  General: No acute distress Mood and affect are appropriate Heart: Regular rate and rhythm no rubs murmurs or extra sounds Lungs: Clear to auscultation, breathing unlabored, no rales or wheezes Abdomen: Positive bowel sounds, soft nontender to palpation, nondistended Extremities: No clubbing, cyanosis, or edema Skin: No evidence of breakdown, no evidence of rash  Neurologic: intact, motor strength is 5/5 in right deltoid, bicep, tricep, grip, hip flexor, knee extensors, ankle dorsiflexor and plantar flexor 3-/5 Left deltoid, 3 - biceps, 2 - triceps , 0/5 grip, 2- HF, KE, 0/5 ADF Sensory exam normal sensation to light touch and proprioception in bilateral upper and lower extremities  Musculoskeletal: reduced left shoulder abduction, neg  impingement sign , no Left hand tenderness or swelling   Assessment/Plan: 1. Functional deficits which require 3+ hours per day of interdisciplinary therapy in a comprehensive inpatient rehab setting. Physiatrist is providing close team supervision and 24 hour management of active medical problems listed below. Physiatrist and rehab team continue to assess barriers to discharge/monitor patient progress toward functional and medical goals  Care Tool:  Bathing    Body parts bathed by patient: Right arm, Chest, Abdomen, Right upper leg, Left upper leg, Face, Left lower leg, Front perineal area, Buttocks   Body parts bathed by helper: Left arm, Right lower leg     Bathing assist Assist Level: Minimal Assistance - Patient > 75%     Upper Body Dressing/Undressing Upper body dressing   What is the patient wearing?: Pull over shirt    Upper body assist Assist Level: Minimal Assistance - Patient > 75%    Lower Body Dressing/Undressing Lower body dressing      What is the patient wearing?: Pants     Lower body assist Assist for lower body dressing: Moderate Assistance - Patient 50 - 74%     Toileting Toileting  Toileting assist Assist for toileting: Maximal Assistance - Patient 25 - 49%     Transfers Chair/bed transfer  Transfers assist     Chair/bed transfer assist level: Minimal Assistance - Patient > 75% Chair/bed transfer assistive device: Armrests   Locomotion Ambulation   Ambulation assist      Assist level: Moderate Assistance - Patient 50 - 74% Assistive device: Other (comment) (by R hallway rail for safety) Max distance: 121f   Walk 10 feet activity   Assist     Assist level: Moderate Assistance - Patient - 50 - 74% Assistive device: Walker-rolling, Orthosis   Walk 50 feet activity   Assist Walk 50 feet with 2 turns activity did not occur: Safety/medical concerns  Assist level: Moderate Assistance - Patient - 50 - 74% Assistive device:  Walker-rolling, Orthosis    Walk 150 feet activity   Assist Walk 150 feet activity did not occur: Safety/medical concerns         Walk 10 feet on uneven surface  activity   Assist Walk 10 feet on uneven surfaces activity did not occur: Safety/medical concerns         Wheelchair     Assist Will patient use wheelchair at discharge?:  (TBD)      Wheelchair assist level: Dependent - Patient 0% Max wheelchair distance: 150'    Wheelchair 50 feet with 2 turns activity    Assist        Assist Level: Dependent - Patient 0%   Wheelchair 150 feet activity     Assist      Assist Level: Dependent - Patient 0%   Blood pressure 134/63, pulse 68, temperature 98.6 F (37 C), resp. rate 17, height '5\' 4"'$  (1.626 m), weight 71.4 kg, SpO2 100 %.  Medical Problem List and Plan: 1.  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  Impaired mobility, ambulating >200 feet, d/c lovenox             -antiplatelet therapy: ASA/Brillinta 3. Pain Management: Tylenol prn.  4. Mood: LCSW to follow for evaluation and support.              -antipsychotic agents: N/A 5. Neuropsych: This patient is capable of making decisions on her own behalf. 6. Skin/Wound Care: Routine pressure relief measures.  7. Fluids/Electrolytes/Nutrition: Monitor I/O.Heart healthy diet. 8.  NSTEMI s/p PCI/DES: On ASA/Brillinta 9. Chronic combined CHF: Monitor for signs of overload. Check weights daily             --Continue Entresto, toprol XL Spironolactone and Crestor.             --SBP goal 130-150 to prevent cerebral hypoperfusion. Vitals:   10/21/20 2000 10/22/20 0444  BP: (!) 135/91 134/63  Pulse: 77 68  Resp:  17  Temp: 98.2 F (36.8 C) 98.6 F (37  C)  SpO2: 100% 100%  Controlled 8/1 10. T2DM: Hgb A1C-6.9. Monitor BS ac/hs. Educate on CM diet.Newly diagnosed CBG (last 3)  Recent Labs    10/21/20 1636 10/21/20 2126 10/22/20 0609  GLUCAP 124* 148* 147*   Controlled 8/1  -- Continue metformin bid (recently added)  11. Anemia: Likely due to DAPT/Lovenox. Reported to have some pink vaginal discharge--vaginally v/s bladder source?             --monitor for signs of bleeding. Monitor serial H/H. 12. Constipation: BM yesterday. Continue Senna and warm prune juice. Does not want to try anything stronger for now. Decrease Senna to 1 tab HS 13. Hypokalemia: supplement 64mq today, monitor K+ weekly.  14. Intermittent SOB: Ordered incentive spirometer to improve breathing strength and ability over time. Provided incentive spirometer education. CXR obtained and stable- discussed with patient.      LOS: 13 days A FACE TO FACE EVALUATION WAS PERFORMED  ACharlett Blake8/03/2020, 9:06 AM

## 2020-10-22 NOTE — Progress Notes (Deleted)
Speech Language Pathology Daily Session Note  Patient Details  Name: Tracey Meyer MRN: [id number] Date of Birth: 1960/10/18  Today's Date: 10/22/2020 SLP Individual Time: 1118-1200 SLP Individual Time Calculation (min): 42 min  Short Term Goals: Week 2: SLP Short Term Goal 1 (Week 2): Patient will utilize speech intelligibility strategies at the sentence level to maximize intelligibility and decrease shortness of breath with overall Sup A verbal cues. SLP Short Term Goal 2 (Week 2): Patient will demosntrate complex problem solving for functional tasks with supervision verbal cues. SLP Short Term Goal 3 (Week 2): Patient will self-monitor and correct erorrs during complex tasks with supervision verbal cues. SLP Short Term Goal 4 (Week 2): Patient will utilize memory compensatory strategies to recall functional information with Sup verbal and visual cues.  Skilled Therapeutic Interventions: Patient agreeable to skilled ST intervention with focus on cognitive-communication goals. SLP facilitated moderately complex deductive reasoning tasks with sup-to-min A verbal cues for organization and problem solving. Patient demonstrated improved self monitoring, organization skills, and was observed taking her time more as compared to previous encounter with similar task. Patient was left in bed with alarm activated and immediate needs within reach at end of session. Continue per current plan of care.      Pain Pain Assessment Pain Scale: 0-10 Pain Score: 0-No pain  Therapy/Group: Individual Therapy  Patty Sermons 10/22/2020, 11:23 AM

## 2020-10-22 NOTE — Progress Notes (Signed)
Occupational Therapy Session Note  Patient Details  Name: Tracey Meyer MRN: [id number] Date of Birth: 09-14-1960  Today's Date: 10/22/2020 OT Individual Time: 7209-4709 OT Individual Time Calculation (min): 73 min    Short Term Goals: Week 1:  OT Short Term Goal 1 (Week 1): Pt will complete UB dressing with supervision for pullover shirt. OT Short Term Goal 1 - Progress (Week 1): Met OT Short Term Goal 2 (Week 1): Pt will complete LB bathing sit to stand with min assist. OT Short Term Goal 2 - Progress (Week 1): Met OT Short Term Goal 3 (Week 1): Pt will complete LB dressing with min assist sit to stand for underpants and pants only. OT Short Term Goal 3 - Progress (Week 1): Not met OT Short Term Goal 4 (Week 1): Pt will complete stand pivot transfer to the 3:1 with min assist. OT Short Term Goal 4 - Progress (Week 1): Not met OT Short Term Goal 5 (Week 1): Pt will use the LUE as a gross assist with supervision during bathing and grooming tasks. OT Short Term Goal 5 - Progress (Week 1): Met  Skilled Therapeutic Interventions/Progress Updates:     Pt received in w/c with no pain  ADL:  Pt completes bathing with CGA for standing balance during peri/buttock hygiene. VC for WB LUE on grab bar for NMR during standing tasks. Pt requires MIN A to wash R armpit with LUE and pt able to use adaptive strategy to wash R arm after demo Pt completes UB dressing with S with VC for hemi dressing. HOH A for lotion application Pt completes LB dressing with MIN A at sit to stand level and A to hold fig 4 positioning for threading LLE. Pt completes footwear with A to position LLE into figure 4 and VC for 1 handed technique and use of stool intermittently Pt completes shower/Tub transfer with MIN A for SPT to shower chair with grab bar from w/c   Therapeutic activity HOH A for functional reach activity to improve LUR activation with facilitation of grasp/release and inhibition of trunk compensation  movmenet/shouder hike. Attempted to put on K tape to facilitate external cue for digit extensio however lotion keeping from sticking ot skin  Pt left at end of session in bw/c with exit alarm on, call light in reach and all needs met   Therapy Documentation Precautions:  Precautions Precautions: Fall Precaution Comments: L hemiparesis Restrictions Weight Bearing Restrictions: No General:   Vital Signs: Therapy Vitals Temp: 98.6 F (37 C) Pulse Rate: 68 Resp: 17 BP: 134/63 Patient Position (if appropriate): Lying Oxygen Therapy SpO2: 100 % O2 Device: Room Air Pain:   ADL: ADL Eating: Supervision/safety Where Assessed-Eating: Wheelchair Grooming: Minimal assistance Where Assessed-Grooming: Edge of bed Upper Body Bathing: Minimal assistance Where Assessed-Upper Body Bathing: Edge of bed Lower Body Bathing: Moderate assistance Where Assessed-Lower Body Bathing: Edge of bed Upper Body Dressing: Minimal assistance Where Assessed-Upper Body Dressing: Edge of bed Lower Body Dressing: Maximal assistance Where Assessed-Lower Body Dressing: Edge of bed Toileting: Maximal assistance Where Assessed-Toileting: Bedside Commode Toilet Transfer: Maximal assistance Toilet Transfer Method: Stand pivot Science writer: Radiographer, therapeutic: Not assessed Social research officer, government: Not assessed Vision   Perception    Praxis   Exercises:   Other Treatments:     Therapy/Group: Individual Therapy  Tonny Branch 10/22/2020, 6:47 AM

## 2020-10-23 LAB — GLUCOSE, CAPILLARY
Glucose-Capillary: 113 mg/dL — ABNORMAL HIGH (ref 70–99)
Glucose-Capillary: 114 mg/dL — ABNORMAL HIGH (ref 70–99)
Glucose-Capillary: 151 mg/dL — ABNORMAL HIGH (ref 70–99)
Glucose-Capillary: 117 mg/dL — ABNORMAL HIGH (ref 70–99)

## 2020-10-23 NOTE — Progress Notes (Signed)
PROGRESS NOTE   Subjective/Complaints:   Patient is ambulating with physical therapy with maximum assistance. No new complaints today  ROS- Denies CP, N/V/D, +shortness of breath   Objective:   No results found. Recent Labs    10/22/20 0456  WBC 5.8  HGB 11.2*  HCT 33.5*  PLT 302     Recent Labs    10/22/20 0456  NA 138  K 3.6  CL 108  CO2 22  GLUCOSE 127*  BUN 13  CREATININE 1.27*  CALCIUM 9.5      Intake/Output Summary (Last 24 hours) at 10/23/2020 1731 Last data filed at 10/23/2020 1300 Gross per 24 hour  Intake 1020 ml  Output --  Net 1020 ml         Physical Exam: Vital Signs Blood pressure 137/78, pulse 84, temperature 98.3 F (36.8 C), resp. rate 17, height '5\' 4"'$  (1.626 m), weight 71.4 kg, SpO2 99 %.  General: No acute distress Mood and affect are appropriate Heart: Regular rate and rhythm no rubs murmurs or extra sounds Lungs: Clear to auscultation, breathing unlabored, no rales or wheezes Abdomen: Positive bowel sounds, soft nontender to palpation, nondistended Extremities: No clubbing, cyanosis, or edema Skin: No evidence of breakdown, no evidence of rash  Neurologic: intact, motor strength is 5/5 in right deltoid, bicep, tricep, grip, hip flexor, knee extensors, ankle dorsiflexor and plantar flexor 3-/5 Left deltoid, 3 - biceps, 2 - triceps , 0/5 grip, 2- HF, KE, 0/5 ADF Sensory exam normal sensation to light touch and proprioception in bilateral upper and lower extremities  Musculoskeletal: reduced left shoulder abduction, neg impingement sign , no Left hand tenderness or swelling   Assessment/Plan: 1. Functional deficits which require 3+ hours per day of interdisciplinary therapy in a comprehensive inpatient rehab setting. Physiatrist is providing close team supervision and 24 hour management of active medical problems listed below. Physiatrist and rehab team continue to assess  barriers to discharge/monitor patient progress toward functional and medical goals  Care Tool:  Bathing    Body parts bathed by patient: Right arm, Chest, Abdomen, Right upper leg, Left upper leg, Face, Left lower leg, Front perineal area, Buttocks   Body parts bathed by helper: Left arm, Right lower leg     Bathing assist Assist Level: Minimal Assistance - Patient > 75%     Upper Body Dressing/Undressing Upper body dressing   What is the patient wearing?: Pull over shirt    Upper body assist Assist Level: Minimal Assistance - Patient > 75%    Lower Body Dressing/Undressing Lower body dressing      What is the patient wearing?: Pants     Lower body assist Assist for lower body dressing: Moderate Assistance - Patient 50 - 74%     Toileting Toileting    Toileting assist Assist for toileting: Maximal Assistance - Patient 25 - 49%     Transfers Chair/bed transfer  Transfers assist     Chair/bed transfer assist level: Contact Guard/Touching assist Chair/bed transfer assistive device: Armrests   Locomotion Ambulation   Ambulation assist      Assist level: Moderate Assistance - Patient 50 - 74% Assistive device: Other (comment) (by R  hallway rail for safety) Max distance: 125f   Walk 10 feet activity   Assist     Assist level: Moderate Assistance - Patient - 50 - 74% Assistive device: Walker-rolling, Orthosis   Walk 50 feet activity   Assist Walk 50 feet with 2 turns activity did not occur: Safety/medical concerns  Assist level: Moderate Assistance - Patient - 50 - 74% Assistive device: Walker-rolling, Orthosis    Walk 150 feet activity   Assist Walk 150 feet activity did not occur: Safety/medical concerns         Walk 10 feet on uneven surface  activity   Assist Walk 10 feet on uneven surfaces activity did not occur: Safety/medical concerns         Wheelchair     Assist Will patient use wheelchair at discharge?:  (TBD)       Wheelchair assist level: Dependent - Patient 0% Max wheelchair distance: 150'    Wheelchair 50 feet with 2 turns activity    Assist        Assist Level: Dependent - Patient 0%   Wheelchair 150 feet activity     Assist      Assist Level: Dependent - Patient 0%   Blood pressure 137/78, pulse 84, temperature 98.3 F (36.8 C), resp. rate 17, height '5\' 4"'$  (1.626 m), weight 71.4 kg, SpO2 99 %.  Medical Problem List and Plan: 1.  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  Impaired mobility, ambulating >200 feet, d/c lovenox             -antiplatelet therapy: ASA/Brillinta 3. Pain Management: Tylenol prn.  4. Mood: LCSW to follow for evaluation and support.              -antipsychotic agents: N/A 5. Neuropsych: This patient is capable of making decisions on her own behalf. 6. Skin/Wound Care: Routine pressure relief measures.  7. Fluids/Electrolytes/Nutrition: Monitor I/O.Heart healthy diet. 8.  NSTEMI s/p PCI/DES: On ASA/Brillinta 9. Chronic combined CHF: Monitor for signs of overload. Check weights daily             --Continue Entresto, toprol XL Spironolactone and Crestor.             --SBP goal 130-150 to prevent cerebral hypoperfusion. Vitals:   10/23/20 1418 10/23/20 1421  BP: (!) 147/85 137/78  Pulse: 83 84  Resp: 17 17  Temp: 98.4 F (36.9 C) 98.3 F (36.8 C)  SpO2: 100% 99%  Controlled 8/2 10. T2DM: Hgb A1C-6.9. Monitor BS ac/hs. Educate on CM diet.Newly diagnosed CBG (last 3)  Recent Labs    10/23/20 0614 10/23/20 1151 10/23/20 1624  GLUCAP 113* 114* 151*   Controlled 8/2  -- Continue metformin bid (recently added)  11. Anemia: Likely due to DAPT/Lovenox. Reported to have some pink vaginal  discharge--vaginally v/s bladder source?             --monitor for signs of bleeding. Monitor serial H/H. 12. Constipation: BM yesterday. Continue Senna and warm prune juice. Does not want to try anything stronger for now. Decrease Senna to 1 tab HS 13. Hypokalemia: supplement 266m today, monitor K+ weekly.  14. Intermittent SOB: Ordered incentive spirometer to improve breathing strength and ability over time. Provided incentive spirometer education. CXR obtained and stable- discussed with patient.      LOS: 14 days A FACE TO FACE EVALUATION WAS PERFORMED  Charlett Blake 10/23/2020, 5:31 PM

## 2020-10-23 NOTE — Progress Notes (Signed)
Occupational Therapy Session Note  Patient Details  Name: Tracey Meyer MRN: [id number] Date of Birth: [birth year]  Today's Date: 10/23/2020 OT Individual Time: [id number] OT Individual Time Calculation (min): 61 min    Short Term Goals: Week 2:  OT Short Term Goal 1 (Week 2): Pt will complete LB dressing with min assist sit to stand for underpants and pants only. OT Short Term Goal 2 (Week 2): Pt will complete stand pivot transfer to the 3:1 with min assist for two consecutive sessions. OT Short Term Goal 3 (Week 2): Pt will use the LUE during bathing tasks with min assist for washing the RUE and the L upper leg. OT Short Term Goal 4 (Week 2): Pt will complete toileting tasks with min assist for 2 consecutive sessions.  Skilled Therapeutic Interventions/Progress Updates:  Pt greeted seated in w/c agreeable to OT intervention. Session focus on various therapeutic activities with a focus on functional grasp and release with LUE and standing balance/ tolerance. Pt stood at high/low table with CGA to create structures with legos with visual aid provided, education provided on using bilateral integration and tenodesis grasp to maneuver blocks. Pt stood for a total of 10 mins with one seated rest break in between. Pt additionally worked on lateral reaching with LUE to 90* with pt instructed to grasp large connect 4 pieces and place on tower on pts R side, lowered height of tower d/t pain reported in L shoulder. Light manual massage performed to pts L hand as pt reports tightness in L thenar eminence. Pt additionally completed seated therex with un weighted dowel rod x10 reps of chest press, and forward<>backward rows. Pt able to tap ball back froth to OTA from sitting in w/c with un weighted dowel rod secured to pts L hand. Pt transported back to room with total A where pt left supine in bed with bed alarm activated and all needs within reach.   Therapy Documentation Precautions:   Precautions Precautions: Fall Precaution Comments: L hemiparesis Restrictions Weight Bearing Restrictions: No General:   Vital Signs: Therapy Vitals Temp: 98.3 F (36.8 C) Temp Source: Oral Pulse Rate: 84 Resp: 17 BP: 137/78 Patient Position (if appropriate): Sitting Oxygen Therapy SpO2: 99 % O2 Device: Room Air Pain:  Pt reports mild pain in L shoulder, repositioning and education provided on decreasing ROM provided as pain mgmt strategy.    Therapy/Group: Individual Therapy  Corinne Ports [facility] 10/23/2020, 4:06 PM

## 2020-10-23 NOTE — Progress Notes (Signed)
Physical Therapy Session Note  Patient Details  Name: Tracey Meyer MRN: [id number] Date of Birth: Aug 11, 1960  Today's Date: 10/23/2020 PT Individual Time: 0905-1020 PT Individual Time Calculation (min): 75 min   Short Term Goals: Week 2:  PT Short Term Goal 1 (Week 2): Pt will perform supine<>sit with CGA PT Short Term Goal 2 (Week 2): Pt will perform sit<>stands using LRAD with CGA PT Short Term Goal 3 (Week 2): Pt will perform bed<>chair transfers using LRAD with min assist consistently PT Short Term Goal 4 (Week 2): Pt will ambulate at least 16f using LRAD with no more than mod assist of 1 PT Short Term Goal 5 (Week 2): Pt will initiate stair navigation training  Skilled Therapeutic Interventions/Progress Updates:    Pt received sitting in w/c and agreeable to therapy session.  Transported to/from gym in w/c for time management and energy conservation. Gait training 1013f no UE support but close +2 assist on R side for safety, with min/mod assist for balance and L LE management to assist with L advancement to increase step length and prevent adduction/scissoring - continued cuing for increased L hip extension and L weight shift during stance - continues to have L LE extensor thrust ~50% of the time though intensity and severity improving. Gait training ~6073fsing RW with min/mod assist of 1 and +2 min assist for safety and cuing for upright posture - cuing for improved RW management, especially when turning . Gait training ~51f7f using RW with only min/mod assist of 1 for balance and L LE management during swing - continues to require facilitation to prevent L LE adduction/scissoring during swing and contining to facilitate L hip extension and L weight shift onto stance limb - cuing to prevent R lateral trunk lean throughout.  Sitting<>tall kneeling on mat with B UE support on bench with mod assist of 1 for balance and L LE management on/off mat. In tall kneeling focused on midline  trunk/pelvic alignment to avoid L hip abducted to side with R lateral trunk lean and L shoulder rotated forward - cuing/facilitation for midline. Attempted to advance to L UE NMR task while in tall kneeling via grasping and picking up small bean bags but difficulty sustaining postural alignment - requires min/mod assist for balance in tall kneeling.  Sitting in w/c L UE NMR via grasping and picking up small bean bags - requires assist to hold/position bean bags to improve pt's ability to grasp the bean bag with min assist to facilitate increased finger extension. Provided pt with yellow foam block to practice finger flexion/extension.  Transported back to room and left sitting in w/c with extra pillows underneath her buttocks to decrease pain from prolonged sitting - left with needs in reach and seat belt alarm on.  Therapy Documentation Precautions:  Precautions Precautions: Fall Precaution Comments: L hemiparesis Restrictions Weight Bearing Restrictions: No   Pain:  No reports of pain throughout session.   Therapy/Group: Individual Therapy  CarlTawana ScaleT, DPT, NCS, CSRS 10/23/2020, 7:57 AM

## 2020-10-23 NOTE — Progress Notes (Signed)
Occupational Therapy Session Note  Patient Details  Name: Tracey Meyer MRN: [id number] Date of Birth: August 31, 1960  Today's Date: 10/23/2020 OT Individual Time: [id number] OT Individual Time Calculation (min): 27 min    Short Term Goals: Week 2:  OT Short Term Goal 1 (Week 2): Pt will complete LB dressing with min assist sit to stand for underpants and pants only. OT Short Term Goal 2 (Week 2): Pt will complete stand pivot transfer to the 3:1 with min assist for two consecutive sessions. OT Short Term Goal 3 (Week 2): Pt will use the LUE during bathing tasks with min assist for washing the RUE and the L upper leg. OT Short Term Goal 4 (Week 2): Pt will complete toileting tasks with min assist for 2 consecutive sessions.  Skilled Therapeutic Interventions/Progress Updates:  Pt greeted supine in bed agreeable to OT intervention. Pt completed bed mobility with MIN A and CGA for stand pivot transfer to w/c. Remainder of session to focus on seated RUE therex, pt completed horizontal ABD/ADD, scapular protraction<>retraction and circumduction with RUE on side table with wash cloth positioned underneath pts hand to decrease friction. Pt additionally completed grasp and release with RUE on compliant cube x15 reps, graded task up and had pt reach laterally to retrieve cones with RUE with LUE acting as active assist and tranports to opposite side of table. Education provided on tenodesis grasp as pt does has some active wrist ROM, pt able to return demo technique with grasp and release of compliant cube. Pt left seated in w/c with alarm belt activated and all needs within reach.   Therapy Documentation Precautions:  Precautions Precautions: Fall Precaution Comments: L hemiparesis Restrictions Weight Bearing Restrictions: No  Pain: Pt reports mild pain in L shoulder, decreased ROM and provided rest breaks as pain mgmt strategies.    Therapy/Group: Individual Therapy  [name] 10/23/2020,  3:35 PM

## 2020-10-23 NOTE — Progress Notes (Signed)
Speech Language Pathology Daily Session Note  Patient Details  Name: BRESLYNN BURDETTE MRN: [id number] Date of Birth: 03/07/1961  Today's Date: 10/23/2020 SLP Individual Time: 1100-1130 SLP Individual Time Calculation (min): 30 min  Short Term Goals: Week 3: SLP Short Term Goal 1 (Week 3): Patient will utilize speech intelligibility strategies at the sentence level to maximize intelligibility and decrease shortness of breath with overall Sup A verbal cues. SLP Short Term Goal 2 (Week 3): Patient will demosntrate complex problem solving for functional tasks with supervision verbal cues. SLP Short Term Goal 3 (Week 3): Patient will self-monitor and correct erorrs during complex tasks with mod I. SLP Short Term Goal 4 (Week 3): Patient will utilize memory compensatory strategies to recall functional information with mod I  Skilled Therapeutic Interventions: Patient agreeable to skilled ST intervention with focus on cognitive goals. SLP facilitated deductive reasoning, problem solving, and selective attention skills with novel deductive reasoning puzzle "Rush Hour". Patient completed with sup A verbal cues and additional processing time for successful completion. Patient exhibited improved self monitoring, processing speed, and ability to repair errors as task progressed. Patient was transferred to bed with min A at end of session. Bed alarm was activated and immediate needs were placed within reach. Continue per current plan of care.      Pain Pain Assessment Pain Scale: 0-10 Pain Score: 0-No pain  Therapy/Group: Individual Therapy  Kohner Orlick T Brittinie Wherley 10/23/2020, 11:15 AM

## 2020-10-24 LAB — GLUCOSE, CAPILLARY
Glucose-Capillary: 113 mg/dL — ABNORMAL HIGH (ref 70–99)
Glucose-Capillary: 127 mg/dL — ABNORMAL HIGH (ref 70–99)
Glucose-Capillary: 143 mg/dL — ABNORMAL HIGH (ref 70–99)
Glucose-Capillary: 160 mg/dL — ABNORMAL HIGH (ref 70–99)

## 2020-10-24 NOTE — Progress Notes (Signed)
Physical Therapy Weekly Progress Note  Patient Details  Name: Tracey Meyer MRN: [id number] Date of Birth: 1961-01-16  Beginning of progress report period: October 17, 2020 End of progress report period: October 24, 2020  Today's Date: 10/24/2020 PT Individual Time: 2620-3559  PT Individual Time Calculation (min): 40 min  and  Today's Date: 10/24/2020 PT Individual Time: 1322-1407 PT Individual Time Calculation (min): 45 min   Patient has met 5 of 5 short term goals.  Ms. Strickler is progressing well with therapy demonstrating increasing independence with functional mobility. She is performing supine<>sit with CGA, sit<>stands and stand pivot transfers with light min assist progressing towards consistent CGA, she is ambulating up to 123f no UE support with mod assist assist for balance and L LE gait mechanics wearing ankle DF assist ACE wrap. She continues to have some L knee extensor thrust during stance along with impaired motor planning/weakness in L hip/knee flexors to advance limb during swing. She has progressed to performing 4 stair navigation using HR support with min assist for balance via step-to pattern. She will benefit from continued CIR level therapies until D/Cing home with 24hr support from family on anticipated D/C of 8/12.  Patient continues to demonstrate the following deficits muscle weakness, muscle joint tightness, and muscle paralysis, decreased cardiorespiratoy endurance, impaired timing and sequencing, abnormal tone, unbalanced muscle activation, decreased coordination, and decreased motor planning, and decreased sitting balance, decreased standing balance, decreased postural control, hemiplegia, and decreased balance strategies and therefore will continue to benefit from skilled PT intervention to increase functional independence with mobility.  Patient not progressing toward long term goals. Downgraded ambulation goals to CGA based on pt's progress and ELOS.  See goal  revision.  Continue plan of care.  PT Short Term Goals Week 2:  PT Short Term Goal 1 (Week 2): Pt will perform supine<>sit with CGA PT Short Term Goal 1 - Progress (Week 2): Met PT Short Term Goal 2 (Week 2): Pt will perform sit<>stands using LRAD with CGA PT Short Term Goal 2 - Progress (Week 2): Met PT Short Term Goal 3 (Week 2): Pt will perform bed<>chair transfers using LRAD with min assist consistently PT Short Term Goal 3 - Progress (Week 2): Met PT Short Term Goal 4 (Week 2): Pt will ambulate at least 557fusing LRAD with no more than mod assist of 1 PT Short Term Goal 4 - Progress (Week 2): Met PT Short Term Goal 5 (Week 2): Pt will initiate stair navigation training PT Short Term Goal 5 - Progress (Week 2): Met Week 3:  PT Short Term Goal 1 (Week 3): = to LTGs based on ELOS  Skilled Therapeutic Interventions/Progress Updates:  Ambulation/gait training;Balance/vestibular training;Cognitive remediation/compensation;Community reintegration;Discharge planning;Disease management/prevention;DME/adaptive equipment instruction;Functional electrical stimulation;Functional mobility training;Neuromuscular re-education;Pain management;Patient/family education;Psychosocial support;Splinting/orthotics;Stair training;Therapeutic Activities;Therapeutic Exercise;UE/LE Strength taining/ROM;UE/LE Coordination activities;Wheelchair propulsion/positioning;Skin care/wound management;Visual/perceptual remediation/compensation   Session 1: Pt received supine in bed with LPN present for morning assessment and medication administration. Pt agreeable to therapy session, requesting to take a shower. Supine>sitting R EOB, HOB flat with CGA. Stand pivot to w/c, no AD, with CGA/min assist for steadying. Transported in/out of bathroom in w/c. Stand pivot w/c<>shower chair using grab bar support with CGA for steadying. Doffed shirt and pants with min assist. Completed bathing task from seated position working on sitting  balance with pt able to perform with close supervision for safety and good safety awareness of when to use UE support on grab bar for balance.Therapist cuing throughout for increased use  of L UE to complete bathing tasks for NMR. Completed sit<>stands using grab bar support to complete peri-hygiene with only close supervision for safety. Completed dressing from seated position requiring min assist for L LE positioning in figure-4 while threading on pants - able to complete sit<>stands at sink with UE support with close supervision/CGA. Donned shirt with min assist for L UE management. Completed oral hygiene at sink in w/c with cuing and facilitation for increased L UE incorporation into bimanual tasks. At end of session pt left seated in w/c with needs in reach and seat belt alarm on.  Session 2: Pt received sitting in w/c and agreeable to therapy session.  Transported to/from gym in w/c for time management and energy conservation. No AFOs available in pt's size to trial at this time. Sit<>stands with and without RW with close supervision/CGA for safety during session.   Gait training ~59f using RW without L heel lift nor AFO in order to assess pt's gait without intervention - requires min/mod assist for balance but is able to advance L LE during swing with intermittent min assist - demos onset of minor L ankle inversion with ACE wrap and continues to have excessive adduction/scissoring - no worsening or improvement of knee extensor thrust without heel wedge noted.  Donned L LE DF assist ACE wrap. Gait training ~76fand ~10053fithout UE support with min assist and intermittent mod assist for balance - continues to require facilitation for L hip extension and weight shift during L stance along with therapist blocking/decreasing L knee extensor thrust (when extensor thrust happens if pt does not perform active hip extension then it causes pt to quickly flex trunk forward) - pt requires intermittent progressed  to consistent min assist to advance L LE during swing phase due to impaired motor planning and impaired hip/knee flexor strength.   Stand pivot transfers with pt using UE support on armrests with min assist for balance during session. Transported back to room and pt left seated in w/c with needs in reach and seat belt alarm on.   Therapy Documentation Precautions:  Precautions Precautions: Fall Precaution Comments: L hemiparesis Restrictions Weight Bearing Restrictions: No   Pain:  Session 1: No reports of pain throughout session.  Session 2: No reports of pain throughout session.   Therapy/Group: Individual Therapy  CarTawana ScalePT, DPT, NCS, CSRS 10/24/2020, 7:52 AM

## 2020-10-24 NOTE — Progress Notes (Signed)
PROGRESS NOTE   Subjective/Complaints:   Pt would like to talk to SW about d/c  ROS- Denies CP, N/V/D, +shortness of breath   Objective:   No results found. Recent Labs    10/22/20 0456  WBC 5.8  HGB 11.2*  HCT 33.5*  PLT 302     Recent Labs    10/22/20 0456  NA 138  K 3.6  CL 108  CO2 22  GLUCOSE 127*  BUN 13  CREATININE 1.27*  CALCIUM 9.5      Intake/Output Summary (Last 24 hours) at 10/24/2020 0842 Last data filed at 10/24/2020 0700 Gross per 24 hour  Intake 680 ml  Output --  Net 680 ml         Physical Exam: Vital Signs Blood pressure 134/81, pulse 68, temperature 98.4 F (36.9 C), temperature source Oral, resp. rate 18, height '5\' 4"'  (1.626 m), weight 71.4 kg, SpO2 100 %. General: No acute distress Mood and affect are appropriate Heart: Regular rate and rhythm no rubs murmurs or extra sounds Lungs: Clear to auscultation, breathing unlabored, no rales or wheezes Abdomen: Positive bowel sounds, soft nontender to palpation, nondistended Extremities: No clubbing, cyanosis, or edema Skin: No evidence of breakdown, no evidence of rash   Neurologic: intact, motor strength is 5/5 in right deltoid, bicep, tricep, grip, hip flexor, knee extensors, ankle dorsiflexor and plantar flexor 3-/5 Left deltoid, 3 - biceps, 2 - triceps , 0/5 grip, 2- HF, KE, 0/5 ADF Sensory exam normal sensation to light touch and proprioception in bilateral upper and lower extremities  Musculoskeletal: reduced left shoulder abduction, neg impingement sign , no Left hand tenderness or swelling   Assessment/Plan: 1. Functional deficits which require 3+ hours per day of interdisciplinary therapy in a comprehensive inpatient rehab setting. Physiatrist is providing close team supervision and 24 hour management of active medical problems listed below. Physiatrist and rehab team continue to assess barriers to discharge/monitor  patient progress toward functional and medical goals  Care Tool:  Bathing    Body parts bathed by patient: Right arm, Chest, Abdomen, Right upper leg, Left upper leg, Face, Left lower leg, Front perineal area, Buttocks   Body parts bathed by helper: Left arm, Right lower leg     Bathing assist Assist Level: Minimal Assistance - Patient > 75%     Upper Body Dressing/Undressing Upper body dressing   What is the patient wearing?: Pull over shirt    Upper body assist Assist Level: Minimal Assistance - Patient > 75%    Lower Body Dressing/Undressing Lower body dressing      What is the patient wearing?: Pants     Lower body assist Assist for lower body dressing: Moderate Assistance - Patient 50 - 74%     Toileting Toileting    Toileting assist Assist for toileting: Maximal Assistance - Patient 25 - 49%     Transfers Chair/bed transfer  Transfers assist     Chair/bed transfer assist level: Contact Guard/Touching assist Chair/bed transfer assistive device: Armrests   Locomotion Ambulation   Ambulation assist      Assist level: Moderate Assistance - Patient 50 - 74% Assistive device: Walker-rolling Max distance: 23f  Walk 10 feet activity   Assist     Assist level: Moderate Assistance - Patient - 50 - 74% Assistive device: Walker-rolling   Walk 50 feet activity   Assist Walk 50 feet with 2 turns activity did not occur: Safety/medical concerns  Assist level: Moderate Assistance - Patient - 50 - 74% Assistive device: Walker-rolling    Walk 150 feet activity   Assist Walk 150 feet activity did not occur: Safety/medical concerns         Walk 10 feet on uneven surface  activity   Assist Walk 10 feet on uneven surfaces activity did not occur: Safety/medical concerns         Wheelchair     Assist Will patient use wheelchair at discharge?:  (TBD)      Wheelchair assist level: Dependent - Patient 0% Max wheelchair distance: 150'     Wheelchair 50 feet with 2 turns activity    Assist        Assist Level: Dependent - Patient 0%   Wheelchair 150 feet activity     Assist      Assist Level: Dependent - Patient 0%   Blood pressure 134/81, pulse 68, temperature 98.4 F (36.9 C), temperature source Oral, resp. rate 18, height '5\' 4"'  (1.626 m), weight 71.4 kg, SpO2 100 %.  Medical Problem List and Plan: 1.  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 Formulized individual treatment plan based on medical history, underlying problem and comorbidities.  2.  Impaired mobility, ambulating >200 feet, d/c lovenox             -antiplatelet therapy: ASA/Brillinta 3. Pain Management: Tylenol prn.  4. Mood: LCSW to follow for evaluation and support.              -antipsychotic agents: N/A 5. Neuropsych: This patient is capable of making decisions on her own behalf. 6. Skin/Wound Care: Routine pressure relief measures.  7. Fluids/Electrolytes/Nutrition: Monitor I/O.Heart healthy diet. 8.  NSTEMI s/p PCI/DES: On ASA/Brillinta 9. Chronic combined CHF: Monitor for signs of overload. Check weights daily             --Continue Entresto, toprol XL Spironolactone and Crestor.             --SBP goal 130-150 to prevent cerebral hypoperfusion. Vitals:   10/24/20 0602 10/24/20 0805  BP: 134/81   Pulse: (!) 58 68  Resp: 18   Temp: 98.4 F (36.9 C)   SpO2: 100%   Controlled 8/3 10. T2DM: Hgb A1C-6.9. Monitor BS ac/hs. Educate on CM diet.Newly diagnosed CBG (last 3)  Recent Labs    10/23/20 1624 10/23/20 2109 10/24/20 0604  GLUCAP 151* 117* 113*    Controlled 8/3  -- Continue metformin bid (recently added)  11. Anemia: Likely due to DAPT/Lovenox. Reported to have some pink vaginal discharge--vaginally v/s bladder source?             --monitor for signs of bleeding. Monitor serial H/H. 12. Constipation: BM yesterday. Continue Senna and warm prune juice. Does not want  to try anything stronger for now. Decrease Senna to 1 tab HS 13. Hypokalemia: supplement 67mq today, monitor K+ weekly.  14. Intermittent SOB: Ordered incentive spirometer to improve breathing strength and ability over time. Provided incentive spirometer education. CXR obtained and stable- discussed with patient.      LOS: 15 days A [place] TO [place] Gweneth Fredlund 10/24/2020, 8:42 AM

## 2020-10-24 NOTE — Progress Notes (Signed)
Occupational Therapy Weekly Progress Note  Patient Details  Name: Tracey Meyer MRN: [id number] Date of Birth: 1960/05/04  Beginning of progress report period: October 10, 2020 End of progress report period: October 24, 2020  Today's Date: 10/24/2020    Patient has met 2 of 4 short term goals. Pt making good progress towards OT goals. Pt currently requires MIN A for bathing from shower level, MIN A for UB dressing,MOD A for LB dressing, MAX A for toileting and CGA for stand pivot transfers. Pts biggest deficit continues to [facility] in pts L hand however pt continues to be motivated during sessions and demonstrates ability to carryover HEP for L hand. Pt additionally with good carryover of using L hand as gross assist as well as implementing compensatory methods into BADLs.   Patient continues to demonstrate the following deficits: muscle weakness, decreased cardiorespiratoy endurance, impaired timing and sequencing, unbalanced muscle activation, and decreased coordination, decreased problem solving, and decreased standing balance, decreased postural control, hemiplegia, and decreased balance strategies and therefore will continue to benefit from skilled OT intervention to enhance overall performance with BADL and Reduce care partner burden.  Patient progressing toward long term goals..  Continue plan of care.  OT Short Term Goals Week 3:  OT Short Term Goal 1 (Week 3): pt will complete UB dressing with CGA using hemi techniques PRN OT Short Term Goal 2 (Week 3): pt will complete 2/3 toileting tasks with MIN A OT Short Term Goal 3 (Week 3): pt will participate in simple meal prep task with good safety awareness and overall MOD A with LRAD  Skilled Therapeutic Interventions/Progress Updates:      Therapy Documentation Precautions:  Precautions Precautions: Fall Precaution Comments: L hemiparesis Restrictions Weight Bearing Restrictions: No     Therapy/Group: Individual Therapy  [name] 10/24/2020, 4:31 PM

## 2020-10-24 NOTE — Patient Care Conference (Signed)
Inpatient RehabilitationTeam Conference and Plan of Care Update Date: 10/24/2020   Time: 10:30 AM    Patient Name: Tracey Meyer      Medical Record Number: [id number]  Date of Birth: [birth year] Sex: Female         Room/Bed: 4W06C/4W06C-01 Payor Info: Payor: MEDICAID POTENTIAL / Plan: MEDICAID POTENTIAL / Product Type: *No Product type* /    Admit Date/Time:  10/09/2020  1:37 PM  Primary Diagnosis:  Embolic stroke [facility])  Hospital Problems: Principal Problem:   Embolic stroke [name])    Expected Discharge Date: Expected Discharge Date: 11/02/20  Team Members Present: Physician leading conference: Dr. Alysia Penna Social Worker Present: Erlene Quan, BSW Nurse Present: Dorien Chihuahua, RN PT Present: Page Spiro, PT OT Present: Leretha Pol, OT SLP Present: Sherren Kerns, SLP PPS Coordinator present : Gunnar Fusi, SLP     Current Status/Progress Goal Weekly Team Focus  Bowel/Bladder   Patient is continent of bladder/bowel, LBM 10/22/20  Pt will remain continent of bowel and bladder      Swallow/Nutrition/ Hydration   Mod I - tolerating regular diet, thin liquids  Mod I  Not actively addressing. Tolerating diet   ADL's   min assist to CGA overall for self care; CGA to min assist for functional transfers  supervision to contact guard  sef care training, LUE strengthening and NMR, functional transfers, balance training, pt education   Mobility   min assist bed mobility, min assist sit<>stand and bed<>chair transfers, mod assist gait up to 166f close to R hallway rail for safety with pt continuing to demo some extensor thrust during stance that is improving with less flexor tone noted at this time, progresesd to stair navigation requiring min/mod assist using HR support for 4 steps  supervision overall at ambulatory level  transfer training, L LE NMR, tone management, activity tolerance, dynamic gait training, pt education, dynamic standing balance, stair navigation    Communication   Sup A for intelligibility  Mod I  Implementation of speech intelligibility strategies at sentence and conversational level   Safety/Cognition/ Behavioral Observations  Sup-to-minA  Mod I  Executive functions including planning, organization, medication/money management, error awareness   Pain   denies Pain  Pt will remain pain free  Continue to assess QS/PRN address concerns with follow up  documentation   Skin   Skin intact  Pt's skin will remain intact  QS/PRN assessment     Discharge Planning:  D/c home with S.O and sister   Team Discussion: MD monitoring anemia; otherwise doing well. Discussed patient concerns regarding discharge destination, continue with plan to discharge home. Patient on target to meet rehab goals: yes, currently min assist sit - stand and stand pivot transfers. Able to ambulate 100' with a RW and mod assist. Limited by left LE sensor thrust/tone; recommend AFO for positioning. Able to manage steps with rails and min - mod assist. Requires min - CGA for self care. Requires supervision - min assist for executive tasks. Discharge goals set for supervision - CHunterand progress notes for long and short-term goals.   Revisions to Treatment Plan:  Working on stair training with a rail, Left LE strengthening, neuromuscular re-educ, organization, planning, money and medication management and error awareness  Teaching Needs: Safety, transfers, medications, etc.  Current Barriers to Discharge: Decreased caregiver support, Home enviroment access/layout, and Insurance for SNF coverage  Possible Resolutions to Barriers: Family education Recommend walker for home     Medical Summary Current  Status: BPs improved meds adjusted , intake improving, continent  Barriers to Discharge: Medical stability   Possible Resolutions to [employer] Focus: Increased tone in LLE, orthotic consult   Continued Need for Acute Rehabilitation Level of  Care: The patient requires daily medical management by a physician with specialized training in physical medicine and rehabilitation for the following reasons: Direction of a multidisciplinary physical rehabilitation program to maximize functional independence : Yes Medical management of patient stability for increased activity during participation in an intensive rehabilitation regime.: Yes Analysis of laboratory values and/or radiology reports with any subsequent need for medication adjustment and/or medical intervention. : Yes   I attest that I was present, lead the team conference, and concur with the assessment and plan of the team.   Dorien Chihuahua B 10/24/2020, 11:11 AM

## 2020-10-24 NOTE — Plan of Care (Signed)
  Problem: RH Balance Goal: LTG Patient will maintain dynamic standing balance (PT) Description: LTG:  Patient will maintain dynamic standing balance with assistance during mobility activities (PT) Flowsheets (Taken 10/24/2020 1917) LTG: Pt will maintain dynamic standing balance during mobility activities with:: (downgraded based on pt's progress) Minimal Assistance - Patient > 75% Note: downgraded based on pt's progress   Problem: RH Car Transfers Goal: LTG Patient will perform car transfers with assist (PT) Description: LTG: Patient will perform car transfers with assistance (PT). Flowsheets (Taken 10/24/2020 1917) LTG: Pt will perform car transfers with assist:: (downgraded based on pt's progress) Contact Guard/Touching assist Note: downgraded based on pt's progress   Problem: RH Ambulation Goal: LTG Patient will ambulate in controlled environment (PT) Description: LTG: Patient will ambulate in a controlled environment, # of feet with assistance (PT). Flowsheets (Taken 10/24/2020 1917) LTG: Pt will ambulate in controlled environ  assist needed:: (downgraded based on pt's progress) Contact Guard/Touching assist LTG: Ambulation distance in controlled environment: 141f using LRAD Note: downgraded based on pt's progress Goal: LTG Patient will ambulate in home environment (PT) Description: LTG: Patient will ambulate in home environment, # of feet with assistance (PT). Flowsheets (Taken 10/24/2020 1917) LTG: Pt will ambulate in home environ  assist needed:: (downgraded based on pt's progress) Contact Guard/Touching assist LTG: Ambulation distance in home environment: 542fusing LRAD

## 2020-10-24 NOTE — Progress Notes (Signed)
Occupational Therapy Session Note  Patient Details  Name: Tracey Meyer MRN: [id number] Date of Birth: Oct 10, 1960  Today's Date: 10/24/2020 OT Individual Time: 1435-1530 OT Individual Time Calculation (min): 55 min    Short Term Goals: Week 2:  OT Short Term Goal 1 (Week 2): Pt will complete LB dressing with min assist sit to stand for underpants and pants only. OT Short Term Goal 2 (Week 2): Pt will complete stand pivot transfer to the 3:1 with min assist for two consecutive sessions. OT Short Term Goal 3 (Week 2): Pt will use the LUE during bathing tasks with min assist for washing the RUE and the L upper leg. OT Short Term Goal 4 (Week 2): Pt will complete toileting tasks with min assist for 2 consecutive sessions.  Skilled Therapeutic Interventions/Progress Updates:  Pt greeted seated in w/c agreeable to OT intervention. Session focus on LUE therex including towel grabs with composite flexion/ extension, shoulder flexion/ extension, and horizontal ADD/ ABD. Worked on manipulating pill box for med mgmt at home with pt mostly using RUE but did utilize LUE as gross assist. Education provided on compensatory methods and completing pillbox in minimal distracting environment, pt receptive to education. Pt additionally completed grasp and release with cups, pt needed hand over hand assist to retrieve cups with LUE. Pt additionally practiced [facility] with LUE with handwriting tasks at pt reports she used to be able to write with her LUE, issued pt built up grip for pen. Pt completed stand pivot transfer from w/c >EOB with CGA, where pt was left supine in bed with bed alarm activated and all needs within reach.   Therapy Documentation Precautions:  Precautions Precautions: Fall Precaution Comments: L hemiparesis Restrictions Weight Bearing Restrictions: No  Pain: Pt reports no pain during session.    Therapy/Group: Individual Therapy  Corinne Ports [facility] 10/24/2020, 4:07 PM

## 2020-10-24 NOTE — Progress Notes (Signed)
Speech Language Pathology Daily Session Note  Patient Details  Name: Tracey Meyer MRN: [id number] Date of Birth: 1960/05/05  Today's Date: 10/24/2020 SLP Individual Time: 0915-1000 SLP Individual Time Calculation (min): 45 min  Short Term Goals: Week 3: SLP Short Term Goal 1 (Week 3): Patient will utilize speech intelligibility strategies at the sentence level to maximize intelligibility and decrease shortness of breath with overall Sup A verbal cues. SLP Short Term Goal 2 (Week 3): Patient will demosntrate complex problem solving for functional tasks with supervision verbal cues. SLP Short Term Goal 3 (Week 3): Patient will self-monitor and correct erorrs during complex tasks with mod I. SLP Short Term Goal 4 (Week 3): Patient will utilize memory compensatory strategies to recall functional information with mod I  Skilled Therapeutic Interventions: Patient agreeable to skilled ST intervention with focus on cognitive-linguistic goals. Patient independently recalled speech intelligibility strategies at independent level and was perceived as 90% intelligible at conversation level this date with intermittent reminders to increase vocal loudness for improved clarity. She exhibited improved self awareness and correction of speech errors. SLP facilitated progression from yesterday's deduction puzzle "Rush Hour" with higher complexity. Patient completed with sup-to-min A verbal/visual cues and additional processing time. She exhibited improved self monitoring and correction of errors and increasing processing speed as task progressed. Patient was left in chair with alarm activated and immediate needs within reach at end of session. Continue per current plan of care.      Pain Pain Assessment Pain Scale: 0-10 Pain Score: 0-No pain  Therapy/Group: Individual Therapy  Patty Sermons 10/24/2020, 9:34 AM

## 2020-10-24 NOTE — Progress Notes (Signed)
Patient ID: Tracey Meyer, female   DOB: 1960-07-29, 60 y.o.   MRN: [id number] Team Conference Report to Patient/Family  Team Conference discussion was reviewed with the patient and caregiver, including goals, any changes in plan of care and target discharge date.  Patient and caregiver express understanding and are in agreement.  The patient has a target discharge date of 11/02/20.  SW spoke with patient and S.O Tracey Meyer), patient and S.O. were under the impression that patient would be discharging to a SNF. SW explained to patient and family that the goal is to d/c home with the anticipating of meeting CGA/MIN A goals. Patient currently does not have insurance coverage. Family understood, no additional questions or concerns.   Tracey Meyer 10/24/2020, 1:59 PM

## 2020-10-25 LAB — GLUCOSE, CAPILLARY
Glucose-Capillary: 138 mg/dL — ABNORMAL HIGH (ref 70–99)
Glucose-Capillary: 158 mg/dL — ABNORMAL HIGH (ref 70–99)
Glucose-Capillary: 133 mg/dL — ABNORMAL HIGH (ref 70–99)
Glucose-Capillary: 149 mg/dL — ABNORMAL HIGH (ref 70–99)

## 2020-10-25 NOTE — Progress Notes (Signed)
Occupational Therapy Session Note  Patient Details  Name: Tracey Meyer MRN: [id number] Date of Birth: November 19, 1960  Today's Date: 10/25/2020 OT Individual Time: [id number] Individual Time Calculation (min): 56 min    Short Term Goals: Week 3:  OT Short Term Goal 1 (Week 3): pt will complete UB dressing with CGA using hemi techniques PRN OT Short Term Goal 2 (Week 3): pt will complete 2/3 toileting tasks with MIN A OT Short Term Goal 3 (Week 3): pt will participate in simple meal prep task with good safety awareness and overall MOD A with LRAD  Skilled Therapeutic Interventions/Progress Updates:   Pt greeted seated in w/c agreeable to OT intervention. Pt transported to ADL apartment with total A. Pt complete functional mobility and reaching tasks in kitchen with RW to simulate IADLs such as cooking at home. Pt able to retrieve items from cabinets but noted to have difficulty reaching OH, education provided on adjusting items in cabinets to shoulder height, MIN A overall for functional mobility in kitchen. Education provided on energy conservation strategies for IADLs in the kitchen with pt verbalizing understanding. Remainder of session to focus on therex to increase overall strength and endurance. Pt completed simulated stair training where pt instructed to step up on 3 inch step and step back down to increase LB strength and NMR in LLE. Pt then completed 6  mins on nustep ( level 5)  to facilitate improvements with overall activity tolerance for higher level functional mobility. Pt transported back to room with total A where pt left seated in w/c with safety belt activated and all needs within reach.   Therapy Documentation Precautions:  Precautions Precautions: Fall Precaution Comments: L hemiparesis Restrictions Weight Bearing Restrictions: No General:   Vital Signs:  Pain: Pt reports no pain during session.    Therapy/Group: Individual Therapy   Corinne Ports [facility] 10/25/2020,  10:35 AM

## 2020-10-25 NOTE — Progress Notes (Signed)
Physical Therapy Session Note  Patient Details  Name: Tracey Meyer MRN: [id number] Date of Birth: [birth year]  Today's Date: 10/25/2020 PT Individual Time: 0800-0830 PT Individual Time Calculation (min): 30 min   Short Term Goals: Week 3:  PT Short Term Goal 1 (Week 3): = to LTGs based on ELOS  Skilled Therapeutic Interventions/Progress Updates:    Pt supine in bed to start session, awake and agreeable to therapy. Reports no pain. Supine<>sit completed with minA for trunk elevation as pt reaching her R hand out for support/assist. Able to sit unsupported at EOB with supervision. Donned tennis shoes and TED's with totalA for time. Completed stand<>pivot transfer with minA for balance from EOB to w/c. Pt wheeled with [place] for time to day room rehab gym. Ace wrapped L foot for DF assist to work on functional gait training. She ambulated ~4f with modA and no UE support - poor ability to coordinate L hip/knee flexors and pt expressing strong fearfulness of falling which she contributes to a "slippery floor." (Floor was laminate). Deferred gait training and worked on isolating hip/knee flexors. Completed squat<>pivot transfer with minA (towards L side) from w/c to mat table. Worked on static standing marching with LLE high knees (lacking full ROM and AROM decreased after ~3 reps due to fatigue). Completed 2x5 sets. Squat<>pivot transfer with CGA towards her R side to her w/c and wheeled back to her room. Remained seated in w/c with safety belt alarm on and all needs in reach at end of session.   Therapy Documentation Precautions:  Precautions Precautions: Fall Precaution Comments: L hemiparesis Restrictions Weight Bearing Restrictions: No General:    Therapy/Group: Individual Therapy  CAlger Simons8/06/2020, 7:46 AM

## 2020-10-25 NOTE — Progress Notes (Signed)
PROGRESS NOTE   Subjective/Complaints:   Pt feels better about ambulation , now minA with walker   ROS- Denies CP, N/V/D, +shortness of breath   Objective:   No results found. No results for input(s): WBC, HGB, HCT, PLT in the last 72 hours.   No results for input(s): NA, K, CL, CO2, GLUCOSE, BUN, CREATININE, CALCIUM in the last 72 hours.    Intake/Output Summary (Last 24 hours) at 10/25/2020 0910 Last data filed at 10/25/2020 0848 Gross per 24 hour  Intake 836 ml  Output --  Net 836 ml         Physical Exam: Vital Signs Blood pressure (!) 136/91, pulse 72, temperature 98.3 F (36.8 C), temperature source Oral, resp. rate 18, height '5\' 4"'$  (1.626 m), weight 71.4 kg, SpO2 100 %. General: No acute distress Mood and affect are appropriate Heart: Regular rate and rhythm no rubs murmurs or extra sounds Lungs: Clear to auscultation, breathing unlabored, no rales or wheezes Abdomen: Positive bowel sounds, soft nontender to palpation, nondistended Extremities: No clubbing, cyanosis, or edema Skin: No evidence of breakdown, no evidence of rash   Neurologic: intact, motor strength is 5/5 in right deltoid, bicep, tricep, grip, hip flexor, knee extensors, ankle dorsiflexor and plantar flexor 3-/5 Left deltoid, 3 - biceps, 2 - triceps , 0/5 grip, 2- HF, KE, 0/5 ADF Sensory exam normal sensation to light touch and proprioception in bilateral upper and lower extremities  Musculoskeletal: reduced left shoulder abduction, neg impingement sign , no Left hand tenderness or swelling   Assessment/Plan: 1. Functional deficits which require 3+ hours per day of interdisciplinary therapy in a comprehensive inpatient rehab setting. Physiatrist is providing close team supervision and 24 hour management of active medical problems listed below. Physiatrist and rehab team continue to assess barriers to discharge/monitor patient progress  toward functional and medical goals  Care Tool:  Bathing    Body parts bathed by patient: Right arm, Chest, Abdomen, Right upper leg, Left upper leg, Face, Left lower leg, Front perineal area, Buttocks   Body parts bathed by helper: Left arm, Right lower leg     Bathing assist Assist Level: Minimal Assistance - Patient > 75%     Upper Body Dressing/Undressing Upper body dressing   What is the patient wearing?: Pull over shirt    Upper body assist Assist Level: Minimal Assistance - Patient > 75%    Lower Body Dressing/Undressing Lower body dressing      What is the patient wearing?: Pants     Lower body assist Assist for lower body dressing: Moderate Assistance - Patient 50 - 74%     Toileting Toileting    Toileting assist Assist for toileting: Maximal Assistance - Patient 25 - 49%     Transfers Chair/bed transfer  Transfers assist     Chair/bed transfer assist level: Minimal Assistance - Patient > 75% Chair/bed transfer assistive device: Armrests   Locomotion Ambulation   Ambulation assist      Assist level: Moderate Assistance - Patient 50 - 74% Assistive device: No Device Max distance: 5'   Walk 10 feet activity   Assist  Walk 10 feet activity did not occur: Safety/medical  concerns  Assist level: Moderate Assistance - Patient - 50 - 74% Assistive device: No Device   Walk 50 feet activity   Assist Walk 50 feet with 2 turns activity did not occur: Safety/medical concerns (no turns yet)  Assist level: Moderate Assistance - Patient - 50 - 74% Assistive device: Walker-rolling    Walk 150 feet activity   Assist Walk 150 feet activity did not occur: Safety/medical concerns         Walk 10 feet on uneven surface  activity   Assist Walk 10 feet on uneven surfaces activity did not occur: Safety/medical concerns         Wheelchair     Assist Will patient use wheelchair at discharge?:  (TBD)      Wheelchair assist level:  Dependent - Patient 0% Max wheelchair distance: 150'    Wheelchair 50 feet with 2 turns activity    Assist        Assist Level: Dependent - Patient 0%   Wheelchair 150 feet activity     Assist      Assist Level: Dependent - Patient 0%   Blood pressure (!) 136/91, pulse 72, temperature 98.3 F (36.8 C), temperature source Oral, resp. rate 18, height '5\' 4"'$  (1.626 m), weight 71.4 kg, SpO2 100 %.  Medical Problem List and Plan: 1.  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  Impaired mobility, ambulating >200 feet, d/c lovenox             -antiplatelet therapy: ASA/Brillinta 3. Pain Management: Tylenol prn.  4. Mood: LCSW to follow for evaluation and support.              -antipsychotic agents: N/A 5. Neuropsych: This patient is capable of making decisions on her own behalf. 6. Skin/Wound Care: Routine pressure relief measures.  7. Fluids/Electrolytes/Nutrition: Monitor I/O.Heart healthy diet. 8.  NSTEMI s/p PCI/DES: On ASA/Brillinta 9. Chronic combined CHF: Monitor for signs of overload. Check weights daily             --Continue Entresto, toprol XL Spironolactone and Crestor.             --SBP goal 130-150 to prevent cerebral hypoperfusion. Vitals:   10/24/20 1935 10/25/20 0604  BP: (!) 148/82 (!) 136/91  Pulse: 68 72  Resp: 18 18  Temp: 98.4 F (36.9 C) 98.3 F (36.8 C)  SpO2: 100% 100%  Controlled 8/4 10. T2DM: Hgb A1C-6.9. Monitor BS ac/hs. Educate on CM diet.Newly diagnosed CBG (last 3)  Recent Labs    10/24/20 1632 10/24/20 2110 10/25/20 0604  GLUCAP 143* 160* 138*   Controlled 8/4  -- Continue metformin bid (recently added)  11. Anemia: Likely due to DAPT/Lovenox. Reported to have some pink vaginal discharge--vaginally v/s bladder source?             --monitor for signs of bleeding. Monitor serial H/H. 12. Constipation: BM  yesterday. Continue Senna and warm prune juice. Does not want to try anything stronger for now. Decrease Senna to 1 tab HS 13. Hypokalemia: supplement 1mq today, monitor K+ weekly.  14. Intermittent SOB: Ordered incentive spirometer to improve breathing strength and ability over time. Provided incentive spirometer education. CXR obtained and stable- discussed with patient.      LOS: 16  days A FACE TO FACE EVALUATION WAS PERFORMED  Charlett Blake 10/25/2020, 9:10 AM

## 2020-10-25 NOTE — Progress Notes (Signed)
Speech Language Pathology Daily Session Note  Patient Details  Name: Tracey Meyer MRN: [id number] Date of Birth: [birth year]  Today's Date: 10/25/2020 SLP Individual Time: [id number] SLP Individual Time Calculation (min): 45 min  Short Term Goals: Week 3: SLP Short Term Goal 1 (Week 3): Patient will utilize speech intelligibility strategies at the sentence level to maximize intelligibility and decrease shortness of breath with overall Sup A verbal cues. SLP Short Term Goal 2 (Week 3): Patient will demosntrate complex problem solving for functional tasks with supervision verbal cues. SLP Short Term Goal 3 (Week 3): Patient will self-monitor and correct erorrs during complex tasks with mod I. SLP Short Term Goal 4 (Week 3): Patient will utilize memory compensatory strategies to recall functional information with mod I  Skilled Therapeutic Interventions: Patient agreeable to skilled ST intervention with focus on cognitive-communication goals. SLP facilitated basic calculations, problem solving, and verbal reasoning task involving answering questions related to a restaurant menu. (e.g., would it be cheaper to order this or that, how much money would you get in return if you paid this amount, etc.). Patient completed task with min A verbal and visual cues for calculations using pen/paper (patient preference), and logical reasoning. Facilitated medication management by identifying pillbox organization errors with sup-to-min A to identify errors. Required mod A verbal and visual cues and consistent verbal explanation to comprehend medication labels. She exhibited difficulty understanding how many pills to take vs. how many times per day. Errors would often result in an over-dose type situation. Recommend continued emphasis medication management, with specific focus on on pill label comprehension. Patient was transferred to bed with sup A for safety. Alarm was activated and immediate needs within reach at  end of session. Continue per current plan of care.      Pain Pain Assessment Pain Scale: 0-10 Pain Score: 0-No pain  Therapy/Group: Individual Therapy  Patty Sermons 10/25/2020, 12:39 PM

## 2020-10-25 NOTE — Progress Notes (Signed)
Physical Therapy Session Note  Patient Details  Name: Tracey Meyer MRN: [id number] Date of Birth: [birth year]  Today's Date: 10/25/2020 PT Individual Time: 1415-1505 PT Individual Time Calculation (min): 50 min   Short Term Goals: Week 3:  PT Short Term Goal 1 (Week 3): = to LTGs based on ELOS  Skilled Therapeutic Interventions/Progress Updates:    Pt supine in bed with HOB elevated and agreeable to therapy. Supine>EOB supervision with RUE use on bed rail.   Transfers from bed<>wc with stand pivot CGA.  AFO consult this session.  Pt completed gait training with RW and MinA and moderate verbal/tactile cuing for upright posture, LLE foot placement, manual facilitation.  34f without AFO/DF assist wrap. Noted LLE thrust into genu recurvatum, ankle inversion, and adduction.  This mildly improved with semi-rigid posterior upright AFO over 2106fwith manual block at popliteal fossa to improve knee control in stance last 1531f Pt demonstrated significantly improved knee control with knee cage donned + AFO and verbal cuing for upright posture [id number], increased difficulty with LLE clearance requiring manual facilitation and prevention of adduction.  Pt transitioned to supine in bed with HOB elevated minA for terminal transition of LLE, bed alarm on, and call bell within reach. No further needs expressed at this time.   Therapy Documentation Precautions:  Precautions Precautions: Fall Precaution Comments: L hemiparesis Restrictions Weight Bearing Restrictions: No  Pain: Pain Assessment Pain Scale: 0-10 Pain Score: 0-No pain   Therapy/Group: Individual Therapy  [name], SPT 10/25/2020, 3:15 PM

## 2020-10-25 NOTE — Progress Notes (Signed)
Pt educated on use of incentive spirometer. Pt verbalizes understanding. Pt performed demonstration. Pt has no further questions. Sheela Stack, LPN

## 2020-10-26 LAB — GLUCOSE, CAPILLARY
Glucose-Capillary: 105 mg/dL — ABNORMAL HIGH (ref 70–99)
Glucose-Capillary: 123 mg/dL — ABNORMAL HIGH (ref 70–99)
Glucose-Capillary: 125 mg/dL — ABNORMAL HIGH (ref 70–99)
Glucose-Capillary: 184 mg/dL — ABNORMAL HIGH (ref 70–99)

## 2020-10-26 NOTE — Progress Notes (Signed)
Occupational Therapy Session Note  Patient Details  Name: Tracey Meyer MRN: [id number] Date of Birth: 01/14/1961  Today's Date: 10/26/2020 OT Individual Time: [id number] OT Individual Time Calculation (min): 42 min    Short Term Goals: Week 3:  OT Short Term Goal 1 (Week 3): pt will complete UB dressing with CGA using hemi techniques PRN OT Short Term Goal 2 (Week 3): pt will complete 2/3 toileting tasks with MIN A OT Short Term Goal 3 (Week 3): pt will participate in simple meal prep task with good safety awareness and overall MOD A with LRAD  Skilled Therapeutic Interventions/Progress Updates:   Pt greeted seated in w/c agreeable to OT intervention. Session focus on functional mobility as precursor to higher level BADLs and BUE therex to increase strength and endurance. Pt completed ~ 29f of functional ambulation with Rw and MOD A with close chair follow pt needed 1 seated rest break in between trials. Pt noted to exhibit scissoring pattern with LLE as pt fatigued, decrease ability to heel strike with LLE d/t impaired LLE coordination. Remainder of session to focus on BUE therex on [employer] with pt completing 5 mins on level 1 with LUE secured to handle. Pt transported back to room with total A where pt was left up in w/c with safety belt activated and all needs within reach.   Therapy Documentation Precautions:  Precautions Precautions: Fall Precaution Comments: L hemiparesis Restrictions Weight Bearing Restrictions: No  Pain: Pt reports no pain during session    Therapy/Group: Individual Therapy  MCorinne PortsKGood [facility]aritan [facility]8/07/2020, 12:07 PM

## 2020-10-26 NOTE — Progress Notes (Signed)
Speech Language Pathology Daily Session Note  Patient Details  Name: Tracey Meyer MRN: [id number] Date of Birth: 26-Jul-1960  Today's Date: 10/26/2020 SLP Individual Time: [id number] SLP Individual Time Calculation (min): 45 min  Short Term Goals: Week 3: SLP Short Term Goal 1 (Week 3): Patient will utilize speech intelligibility strategies at the sentence level to maximize intelligibility and decrease shortness of breath with overall Sup A verbal cues. SLP Short Term Goal 2 (Week 3): Patient will demosntrate complex problem solving for functional tasks with supervision verbal cues. SLP Short Term Goal 3 (Week 3): Patient will self-monitor and correct erorrs during complex tasks with mod I. SLP Short Term Goal 4 (Week 3): Patient will utilize memory compensatory strategies to recall functional information with mod I  Skilled Therapeutic Interventions: Pt seen for skilled ST with focus on cognitive goals, primarily ongoing medication management tasks. SLP facilitating med management tasks by providing a variety of prescription labels with comprehensive questions regarding how many to take at once, how many per day, how long in between doses, etc. Pt averaged 40% accuracy independent, mod cues increased to 60%. Pt errors would often result in over-dose type situation, difficulty remembering how many to take at once (sometimes doubling and tripling recommended dosage). Pt with limited awareness into errors during task. Will benefit from ongoing education and training as patient states she is also responsible foy her boyfriends medication management. Pt left in wheelchair with alarm set and all needs within reach. Cont ST POC.   Pain Pain Assessment Pain Scale: 0-10 Pain Score: 0-No pain  Therapy/Group: Individual Therapy  Dewaine Conger 10/26/2020, 8:52 AM

## 2020-10-26 NOTE — Progress Notes (Signed)
Patient ID: Tracey Meyer, female   DOB: February 08, 1961, 60 y.o.   MRN: [id number]  SW received VM for patient LTC Medicaid CW [facility]). Returned called at [phone] and left a VM for return call.  [place], [place]

## 2020-10-26 NOTE — Progress Notes (Signed)
Physical Therapy Session Note  Patient Details  Name: KINZY LEIDECKER MRN: [id number] Date of Birth: 09/19/1960  Today's Date: 10/26/2020 PT Individual Time: 1015-1100 PT Individual Time Calculation (min): 45 min   Short Term Goals:  Week 3:  PT Short Term Goal 1 (Week 3): = to LTGs based on ELOS  Skilled Therapeutic Interventions/Progress Updates:     Pt seated in w.c to start session - agreeable to therapy. Reports no pain. Donned TEDs and shoes with totalA for time. Pt was then wheeled to main rehab gym for time. Ace wrapped L foot for DF assist. Sit<>stand to RW (needed cues for placing L arm to armrest rather than pulling RW to stand) and assist needed for strapping L hand into RW orthotic. Ambulated 10f + 45fwith RW and minA - gait deficits include decreased plantar flexion in toe-off and loading response phase, foot is flat, and minimal hip/knee flexion for swing - significant knee recurvatum in terminal stance.   Introduced cuWater engineerut pt fearful of efforts and request to attempt stairs instead.   Stair training up/down 4 + 4 steps (seated rest) with minA and 2 hand rails for ascent and 1 hand rail with descent. Hip hiking for bringing L leg up onto step and knee recurvatum while bringing L foot down. Pt repots she has no hand rails with 2 steps to enter home and 8 steps for the other entrance with 1 hand rail (but questionable reliability of hand rail). She reports her boyfriend will help her with the stairs but also states he's "on disability."   Pt returned to her room at end of session in w/c and requesting to remain upright for lunch. Safety belt alarm on and pt made comfortable. All needs within reach.   Therapy Documentation Precautions:  Precautions Precautions: Fall Precaution Comments: L hemiparesis Restrictions Weight Bearing Restrictions: No General:    Therapy/Group: Individual Therapy  ChAlger Simons/07/2020, 7:34 AM

## 2020-10-26 NOTE — Progress Notes (Signed)
Occupational Therapy Session Note  Patient Details  Name: Tracey Meyer MRN: [id number] Date of Birth: [birth year]  Today's Date: 10/26/2020 OT Group Time: [id number] OT Group Time Calculation (min): 60 min   Short Term Goals: Week 3:  OT Short Term Goal 1 (Week 3): pt will complete UB dressing with CGA using hemi techniques PRN OT Short Term Goal 2 (Week 3): pt will complete 2/3 toileting tasks with MIN A OT Short Term Goal 3 (Week 3): pt will participate in simple meal prep task with good safety awareness and overall MOD A with LRAD  Skilled Therapeutic Interventions/Progress Updates:  Pt seen for group session with a focus on home management tasks and kitchen tasks. Education provided on energy conservation strategies for home mgmt and kitchen tasks. Pt reports at home she plans to navigate her kitchen from RW level. Pt able to demo reaching onto counter space to retrieve items from RW level. Education provided on reorganizing frequently used pots/ pans to more accessible level as pt unable to safely reach below knee level to retrieve IADLs items at this time. Pt reports she will get her husband to assist with accessing kitchen items that she cannot reach. Education also provided on energy conservation strategies for home mgmt tasks such as using smaller cleaning bottles so pt does not have to expend excess energy picking up heavy bottles. Education provided on using LH sponge to clean shower/ bathtub from w/c level. Pt reports she has very supportive family at home and feels supported at home. Additionally issued pt RW bag to use as energy conservation strategy as well as working to decrease risk of falls at home, pt very appreciative and receptive to all education. Pt transported back to room by this OTA where pt completed stand pivot transfer from w/c >EOB with MIN A. Pt left supine in bed with bed alarm activated and all needs within reach.    Therapy Documentation Precautions:   Precautions Precautions: Fall Precaution Comments: L hemiparesis Restrictions Weight Bearing Restrictions: No  Pain: Pt reports no pain during group session.    Therapy/Group: Group Therapy  [name] 10/26/2020, 3:48 PM

## 2020-10-26 NOTE — Progress Notes (Signed)
PROGRESS NOTE   Subjective/Complaints:   Asking about left arm and leg numbness discussed effects of CVA   ROS- Denies CP, N/V/D, +shortness of breath   Objective:   No results found. No results for input(s): WBC, HGB, HCT, PLT in the last 72 hours.   No results for input(s): NA, K, CL, CO2, GLUCOSE, BUN, CREATININE, CALCIUM in the last 72 hours.    Intake/Output Summary (Last 24 hours) at 10/26/2020 0911 Last data filed at 10/25/2020 1835 Gross per 24 hour  Intake 480 ml  Output --  Net 480 ml         Physical Exam: Vital Signs Blood pressure (!) 137/92, pulse 83, temperature 98.2 F (36.8 C), temperature source Oral, resp. rate 18, height '5\' 4"'$  (1.626 m), weight 71.4 kg, SpO2 100 %. General: No acute distress Mood and affect are appropriate Heart: Regular rate and rhythm no rubs murmurs or extra sounds Lungs: Clear to auscultation, breathing unlabored, no rales or wheezes Abdomen: Positive bowel sounds, soft nontender to palpation, nondistended Extremities: No clubbing, cyanosis, or edema Skin: No evidence of breakdown, no evidence of rash   Neurologic: intact, motor strength is 5/5 in right deltoid, bicep, tricep, grip, hip flexor, knee extensors, ankle dorsiflexor and plantar flexor 3-/5 Left deltoid, 3 - biceps, 2 - triceps , 0/5 grip, 2- HF, KE, 0/5 ADF Sensory exam normal sensation to light touch and proprioception in bilateral upper and lower extremities  Musculoskeletal: reduced left shoulder abduction, neg impingement sign , no Left hand tenderness or swelling   Assessment/Plan: 1. Functional deficits which require 3+ hours per day of interdisciplinary therapy in a comprehensive inpatient rehab setting. Physiatrist is providing close team supervision and 24 hour management of active medical problems listed below. Physiatrist and rehab team continue to assess barriers to discharge/monitor patient  progress toward functional and medical goals  Care Tool:  Bathing    Body parts bathed by patient: Right arm, Chest, Abdomen, Right upper leg, Left upper leg, Face, Left lower leg, Front perineal area, Buttocks   Body parts bathed by helper: Left arm, Right lower leg     Bathing assist Assist Level: Minimal Assistance - Patient > 75%     Upper Body Dressing/Undressing Upper body dressing   What is the patient wearing?: Pull over shirt    Upper body assist Assist Level: Minimal Assistance - Patient > 75%    Lower Body Dressing/Undressing Lower body dressing      What is the patient wearing?: Pants     Lower body assist Assist for lower body dressing: Moderate Assistance - Patient 50 - 74%     Toileting Toileting    Toileting assist Assist for toileting: Maximal Assistance - Patient 25 - 49%     Transfers Chair/bed transfer  Transfers assist     Chair/bed transfer assist level: Minimal Assistance - Patient > 75% (HHA) Chair/bed transfer assistive device: Armrests   Locomotion Ambulation   Ambulation assist      Assist level: Minimal Assistance - Patient > 75% Assistive device: Walker-rolling (walker hand splint) Max distance: 10 ft   Walk 10 feet activity   Assist  Walk 10 feet activity  did not occur: Safety/medical concerns  Assist level: Moderate Assistance - Patient - 50 - 74% Assistive device: No Device   Walk 50 feet activity   Assist Walk 50 feet with 2 turns activity did not occur: Safety/medical concerns (no turns yet)  Assist level: Moderate Assistance - Patient - 50 - 74% Assistive device: Walker-rolling    Walk 150 feet activity   Assist Walk 150 feet activity did not occur: Safety/medical concerns         Walk 10 feet on uneven surface  activity   Assist Walk 10 feet on uneven surfaces activity did not occur: Safety/medical concerns         Wheelchair     Assist Will patient use wheelchair at discharge?:   (TBD)      Wheelchair assist level: Dependent - Patient 0% Max wheelchair distance: 150'    Wheelchair 50 feet with 2 turns activity    Assist        Assist Level: Dependent - Patient 0%   Wheelchair 150 feet activity     Assist      Assist Level: Dependent - Patient 0%   Blood pressure (!) 137/92, pulse 83, temperature 98.2 F (36.8 C), temperature source Oral, resp. rate 18, height '5\' 4"'$  (1.626 m), weight 71.4 kg, SpO2 100 %.  Medical Problem List and Plan: 1.  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  Impaired mobility, ambulating >200 feet, d/c lovenox             -antiplatelet therapy: ASA/Brillinta 3. Pain Management: Tylenol prn.  4. Mood: LCSW to follow for evaluation and support.              -antipsychotic agents: N/A 5. Neuropsych: This patient is capable of making decisions on her own behalf. 6. Skin/Wound Care: Routine pressure relief measures.  7. Fluids/Electrolytes/Nutrition: Monitor I/O.Heart healthy diet. 8.  NSTEMI s/p PCI/DES: On ASA/Brillinta 9. Chronic combined CHF: Monitor for signs of overload. Check weights daily             --Continue Entresto, toprol XL Spironolactone and Crestor.             --SBP goal 130-150 to prevent cerebral hypoperfusion. Vitals:   10/26/20 0328 10/26/20 0826  BP: 125/66 (!) 137/92  Pulse: 72 83  Resp: 18   Temp: 98.2 F (36.8 C)   SpO2: 100%   Controlled 8/5 10. T2DM: Hgb A1C-6.9. Monitor BS ac/hs. Educate on CM diet.Newly diagnosed CBG (last 3)  Recent Labs    10/25/20 1626 10/25/20 2112 10/26/20 0626  GLUCAP 133* 149* 105*   Controlled 8/5  -- Continue metformin bid (recently added)  11. Anemia: Likely due to DAPT/Lovenox. Reported to have some pink vaginal discharge--vaginally v/s bladder source?             --monitor for signs of bleeding. Monitor serial H/H. 12.  Constipation: BM yesterday. Continue Senna and warm prune juice. Does not want to try anything stronger for now. Decrease Senna to 1 tab HS 13. Hypokalemia: supplement 26mq today, monitor K+ weekly.  14. Intermittent SOB: Ordered incentive spirometer to improve breathing strength and ability over time. Provided incentive spirometer education. CXR obtained and stable- discussed with patient.      LOS: 17  days A FACE TO FACE EVALUATION WAS PERFORMED  Charlett Blake 10/26/2020, 9:11 AM

## 2020-10-27 DIAGNOSIS — I10 Essential (primary) hypertension: Secondary | ICD-10-CM

## 2020-10-27 DIAGNOSIS — E669 Obesity, unspecified: Secondary | ICD-10-CM

## 2020-10-27 DIAGNOSIS — E1169 Type 2 diabetes mellitus with other specified complication: Secondary | ICD-10-CM

## 2020-10-27 LAB — GLUCOSE, CAPILLARY
Glucose-Capillary: 118 mg/dL — ABNORMAL HIGH (ref 70–99)
Glucose-Capillary: 148 mg/dL — ABNORMAL HIGH (ref 70–99)
Glucose-Capillary: 114 mg/dL — ABNORMAL HIGH (ref 70–99)
Glucose-Capillary: 151 mg/dL — ABNORMAL HIGH (ref 70–99)

## 2020-10-27 NOTE — Progress Notes (Signed)
PROGRESS NOTE   Subjective/Complaints:   No new problems today.  Apparently there are some issues at home which caused anxiety last night.  These are improved today per patient  ROS: Patient denies fever, rash, sore throat, blurred vision, nausea, vomiting, diarrhea, cough, shortness of breath or chest pain, joint or back pain, headache, or mood change.   Objective:   No results found. No results for input(s): WBC, HGB, HCT, PLT in the last 72 hours.   No results for input(s): NA, K, CL, CO2, GLUCOSE, BUN, CREATININE, CALCIUM in the last 72 hours.    Intake/Output Summary (Last 24 hours) at 10/27/2020 1053 Last data filed at 10/27/2020 0700 Gross per 24 hour  Intake 476 ml  Output --  Net 476 ml        Physical Exam: Vital Signs Blood pressure (!) 136/91, pulse 77, temperature 98.1 F (36.7 C), temperature source Oral, resp. rate 18, height '5\' 4"'$  (1.626 m), weight 71.4 kg, SpO2 99 %. Constitutional: No distress . Vital signs reviewed. HEENT: NCAT, EOMI, oral membranes moist Neck: supple Cardiovascular: RRR without murmur. No JVD    Respiratory/Chest: CTA Bilaterally without wheezes or rales. Normal effort    GI/Abdomen: BS +, non-tender, non-distended Ext: no clubbing, cyanosis, or edema Psych: pleasant and cooperative  Neurologic: intact, motor strength is 5/5 in right deltoid, bicep, tricep, grip, hip flexor, knee extensors, ankle dorsiflexor and plantar flexor 3-/5 Left deltoid, 3 - biceps, 2 - triceps , 0/5 grip, 2- HF, KE, 0/5 ADF Sensory exam normal sensation to light touch and proprioception in bilateral upper and lower extremities  Musculoskeletal: reduced left shoulder abduction, neg impingement sign , no Left hand tenderness or swelling. Left heel cord tight  Assessment/Plan: 1. Functional deficits which require 3+ hours per day of interdisciplinary therapy in a comprehensive inpatient rehab  setting. Physiatrist is providing close team supervision and 24 hour management of active medical problems listed below. Physiatrist and rehab team continue to assess barriers to discharge/monitor patient progress toward functional and medical goals  Care Tool:  Bathing    Body parts bathed by patient: Right arm, Chest, Abdomen, Right upper leg, Left upper leg, Face, Left lower leg, Front perineal area, Buttocks   Body parts bathed by helper: Left arm, Right lower leg     Bathing assist Assist Level: Minimal Assistance - Patient > 75%     Upper Body Dressing/Undressing Upper body dressing   What is the patient wearing?: Pull over shirt    Upper body assist Assist Level: Minimal Assistance - Patient > 75%    Lower Body Dressing/Undressing Lower body dressing      What is the patient wearing?: Pants     Lower body assist Assist for lower body dressing: Moderate Assistance - Patient 50 - 74%     Toileting Toileting    Toileting assist Assist for toileting: Maximal Assistance - Patient 25 - 49%     Transfers Chair/bed transfer  Transfers assist     Chair/bed transfer assist level: Minimal Assistance - Patient > 75% (HHA) Chair/bed transfer assistive device: Armrests   Locomotion Ambulation   Ambulation assist      Assist level:  Moderate Assistance - Patient 50 - 74% Assistive device: Walker-rolling Max distance: 75 ft   Walk 10 feet activity   Assist  Walk 10 feet activity did not occur: Safety/medical concerns  Assist level: Moderate Assistance - Patient - 50 - 74% Assistive device: No Device   Walk 50 feet activity   Assist Walk 50 feet with 2 turns activity did not occur: Safety/medical concerns (no turns yet)  Assist level: Moderate Assistance - Patient - 50 - 74% Assistive device: Walker-rolling    Walk 150 feet activity   Assist Walk 150 feet activity did not occur: Safety/medical concerns         Walk 10 feet on uneven surface   activity   Assist Walk 10 feet on uneven surfaces activity did not occur: Safety/medical concerns         Wheelchair     Assist Will patient use wheelchair at discharge?:  (TBD)      Wheelchair assist level: Dependent - Patient 0% Max wheelchair distance: 150'    Wheelchair 50 feet with 2 turns activity    Assist        Assist Level: Dependent - Patient 0%   Wheelchair 150 feet activity     Assist      Assist Level: Dependent - Patient 0%   Blood pressure (!) 136/91, pulse 77, temperature 98.1 F (36.7 C), temperature source Oral, resp. rate 18, height '5\' 4"'$  (1.626 m), weight 71.4 kg, SpO2 99 %.  Medical Problem List and Plan: 1.  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  Impaired mobility, ambulating >200 feet, d/c lovenox             -antiplatelet therapy: ASA/Brillinta 3. Pain Management: Tylenol prn.  4. Mood: LCSW to follow for evaluation and support.              -antipsychotic agents: N/A 5. Neuropsych: This patient is capable of making decisions on her own behalf. 6. Skin/Wound Care: Routine pressure relief measures.  7. Fluids/Electrolytes/Nutrition: Monitor I/O.Heart healthy diet. 8.  NSTEMI s/p PCI/DES: On ASA/Brillinta 9. Chronic combined CHF: Monitor for signs of overload. Check weights daily             --Continue Entresto, toprol XL Spironolactone and Crestor.             --SBP goal 130-150 to prevent cerebral hypoperfusion. Vitals:   10/27/20 0410 10/27/20 0822  BP: 116/83 (!) 136/91  Pulse: 66 77  Resp: 18   Temp: 98.1 F (36.7 C)   SpO2: 99%   Borderline 8/6 10. T2DM: Hgb A1C-6.9. Monitor BS ac/hs. Educate on CM diet.Newly diagnosed CBG (last 3)  Recent Labs    10/26/20 1638 10/26/20 2101 10/27/20 0603  GLUCAP 125* 184* 118*  Controlled 8/6  -- Continue metformin bid (recently added)  11. Anemia:  Likely due to DAPT/Lovenox. Reported to have some pink vaginal discharge--vaginally v/s bladder source?             --monitor for signs of bleeding. Monitor serial H/H. 12. Constipation: BM yesterday. Continue Senna and warm prune juice. Does not want to try anything stronger for now. Decrease Senna to 1 tab HS 13. Hypokalemia: supplement 61mq today, monitor K+ weekly.  14. Intermittent SOB: Ordered incentive  spirometer to improve breathing strength and ability over time. Provided incentive spirometer education. CXR obtained and stable     LOS: 18 days A FACE TO [place] 10/27/2020, 10:53 AM

## 2020-10-27 NOTE — Progress Notes (Signed)
Speech Language Pathology Daily Session Note  Patient Details  Name: Tracey Meyer MRN: [id number] Date of Birth: [birth year]  Today's Date: 10/27/2020 SLP Individual Time: 1300-1345 SLP Individual Time Calculation (min): 45 min  Short Term Goals: Week 3: SLP Short Term Goal 1 (Week 3): Patient will utilize speech intelligibility strategies at the sentence level to maximize intelligibility and decrease shortness of breath with overall Sup A verbal cues. SLP Short Term Goal 2 (Week 3): Patient will demosntrate complex problem solving for functional tasks with supervision verbal cues. SLP Short Term Goal 3 (Week 3): Patient will self-monitor and correct erorrs during complex tasks with mod I. SLP Short Term Goal 4 (Week 3): Patient will utilize memory compensatory strategies to recall functional information with mod I  Skilled Therapeutic Interventions: Patient agreeable to skilled ST intervention with focus on cognitive goals. SLP facilitated session by providing sup A verbal cues progressing to mod I for mildly complex abstract reasoning task. Patient exhibited improved speech intelligibility at conversation level today with sup A verbal cues to increase vocal loudness to enhance intelligibility to ~ 90%. Patient was left in bed with alarm activated and immediate needs within reach at end of session. Continue per current plan of care.      Pain Pain Assessment Pain Scale: 0-10 Pain Score: 0-No pain  Therapy/Group: Individual Therapy  Ronique Simerly T Meko Masterson 10/27/2020, 1:42 PM

## 2020-10-27 NOTE — Progress Notes (Signed)
Physical Therapy Session Note  Patient Details  Name: Tracey Meyer MRN: [id number] Date of Birth: 01/22/1961  Today's Date: 10/27/2020 PT Individual Time: 0907-1005 PT Individual Time Calculation (min): 58 min   Short Term Goals: Week 3:  PT Short Term Goal 1 (Week 3): = to LTGs based on ELOS  Skilled Therapeutic Interventions/Progress Updates:    Pt received supine in bed reporting she had a "rough night" last night due to concerns at home, but despite this pt is eager to participate in therapy and focus on her recovery. Pt requesting to shower. Supine>sitting R EOB, HOB flat and no bedrails, light min assist for trunk rotation and upright. L stand pivot EOB>w/c, no AD, with CGA - continues to have excessive L knee extension during transfer. Transported in/out of bathroom in w/c. Stand pivot w/c<>shower chair using UE support on grab bar with CGA for steadying.  Sitting on shower chair able to doff UB and LB clothing without assist given increased time and effort - this is improved with pt demoing improved sitting balance to reach down to feet.   Participated in bathing of UB and LB with assist only for her back - completed sit<>stand in shower using UE support on grab bar for safety to complete peri-hygiene.   Sitting in w/c donned shirt with assist for managing over L UE and then donned pants with significant improvement in ability to thread pants over L LE using figure-4 positioning - sit<>stand CGA to pull pants over hips with pt having improved ability to pull pants up over L hip.   Discussed D/C recommendation regarding the following:  - need for wheelchair to allow increased independence with functional mobility in the home as pt currently requires hands-on assistance for ambulation  - need for RW for standing balance and gait as able with family assist - plan for family education/training this week prior to D/C - recommendation for follow-up OPPT but possibility of insurance  limiting number of visits  Pt verbalizes understanding and is in agreement with the plan. Pt left seated in w/c with needs in reach and seat belt alarm on.     Therapy Documentation Precautions:  Precautions Precautions: Fall Precaution Comments: L hemiparesis Restrictions Weight Bearing Restrictions: No   Pain:  No reports of pain throughout session.   Therapy/Group: Individual Therapy  Tawana Scale , PT, DPT, NCS, CSRS 10/27/2020, 7:54 AM

## 2020-10-28 LAB — GLUCOSE, CAPILLARY
Glucose-Capillary: 115 mg/dL — ABNORMAL HIGH (ref 70–99)
Glucose-Capillary: 121 mg/dL — ABNORMAL HIGH (ref 70–99)
Glucose-Capillary: 130 mg/dL — ABNORMAL HIGH (ref 70–99)
Glucose-Capillary: 112 mg/dL — ABNORMAL HIGH (ref 70–99)

## 2020-10-28 NOTE — Progress Notes (Signed)
Physical Therapy Session Note  Patient Details  Name: Tracey Meyer MRN: [id number] Date of Birth: [birth year]  Today's Date: 10/28/2020 PT Individual Time: 0902-0958 PT Individual Time Calculation (min): 56 min   Short Term Goals: Week 1:  PT Short Term Goal 1 (Week 1): Pt will complete least restrictive transfer with min A consistently PT Short Term Goal 1 - Progress (Week 1): Progressing toward goal PT Short Term Goal 2 (Week 1): Pt will ambulate with assist x 1 consistently PT Short Term Goal 2 - Progress (Week 1): Met PT Short Term Goal 3 (Week 1): Pt will initiate stair training PT Short Term Goal 3 - Progress (Week 1): Progressing toward goal  Skilled Therapeutic Interventions/Progress Updates:  Pt was seen bedside in the pm. Pt performed bed mobility with min A and verbal cues with side rail. Pt performed sit to stand transfers with min A and verbal cues. Pt performed stand pivot transfers with min to mod A and verbal cues. Pt propelled w/c 150 and 75 feet with R UE and LE with S and increased time. Pt ambulated 10 and 25 feet with rolling walker, L hand orthosis and mod A with verbal cues. Pt performed LE exercises, hip flex and LAQs, sets x 10 reps each, AAROM L LE and AROM R LE. Pt returned to room and left sitting up in bed with all needs within reach and bed alarm on.   Therapy Documentation Precautions:  Precautions Precautions: Fall Precaution Comments: L hemiparesis Restrictions Weight Bearing Restrictions: No General:   Vital Signs:  Pain: No c/o pain.   Therapy/Group: Individual Therapy  Dub Amis 10/28/2020, 12:18 PM

## 2020-10-28 NOTE — Progress Notes (Signed)
Physical Therapy Session Note  Patient Details  Name: JACQUITA MULHEARN MRN: [id number] Date of Birth: [birth year]  Today's Date: 10/28/2020 PT Individual Time: 1500-1525 PT Individual Time Calculation (min): 25 min   Short Term Goals: Week 1:  PT Short Term Goal 1 (Week 1): Pt will complete least restrictive transfer with min A consistently PT Short Term Goal 1 - Progress (Week 1): Progressing toward goal PT Short Term Goal 2 (Week 1): Pt will ambulate with assist x 1 consistently PT Short Term Goal 2 - Progress (Week 1): Met PT Short Term Goal 3 (Week 1): Pt will initiate stair training PT Short Term Goal 3 - Progress (Week 1): Progressing toward goal  Skilled Therapeutic Interventions/Progress Updates:  Pt was seen bedside in the am. Pt's treatment focused on sit to stand and steps in the parallel bars. Pt performed blocked practice 3 sets x 5 reps each sit to stands. Pt also worked on steps in the parallel bars. Pt returned to room following treatment. Pt transferred to edge of bed with min A. Pt transferred to supine with min A. Pt left sitting up in bed with all needs within reach and bed alarm on.   Therapy Documentation Precautions:  Precautions Precautions: Fall Precaution Comments: L hemiparesis Restrictions Weight Bearing Restrictions: No General:   Pain: No c/o pain.     Therapy/Group: Individual Therapy  Dub Amis 10/28/2020, 3:33 PM

## 2020-10-29 LAB — GLUCOSE, CAPILLARY
Glucose-Capillary: 120 mg/dL — ABNORMAL HIGH (ref 70–99)
Glucose-Capillary: 157 mg/dL — ABNORMAL HIGH (ref 70–99)
Glucose-Capillary: 114 mg/dL — ABNORMAL HIGH (ref 70–99)
Glucose-Capillary: 129 mg/dL — ABNORMAL HIGH (ref 70–99)

## 2020-10-29 LAB — BASIC METABOLIC PANEL
Anion gap: 8 (ref 5–15)
BUN: 19 mg/dL (ref 6–20)
CO2: 21 mmol/L — ABNORMAL LOW (ref 22–32)
Calcium: 9.6 mg/dL (ref 8.9–10.3)
Chloride: 109 mmol/L (ref 98–111)
Creatinine, Ser: 1.94 mg/dL — ABNORMAL HIGH (ref 0.44–1.00)
GFR, Estimated: 29 mL/min — ABNORMAL LOW (ref >60)
Glucose, Bld: 118 mg/dL — ABNORMAL HIGH (ref 70–99)
Potassium: 3.2 mmol/L — ABNORMAL LOW (ref 3.5–5.1)
Sodium: 138 mmol/L (ref 135–145)

## 2020-10-29 LAB — CBC
HCT: 34.3 % — ABNORMAL LOW (ref 36.0–46.0)
Hemoglobin: 11.5 g/dL — ABNORMAL LOW (ref 12.0–15.0)
MCH: 30.3 pg (ref 26.0–34.0)
MCHC: 33.5 g/dL (ref 30.0–36.0)
MCV: 90.3 fL (ref 80.0–100.0)
Platelets: 242 10*3/uL (ref 150–400)
RBC: 3.8 MIL/uL — ABNORMAL LOW (ref 3.87–5.11)
RDW: 12.8 % (ref 11.5–15.5)
WBC: 5.6 10*3/uL (ref 4.0–10.5)
nRBC: 0 % (ref 0.0–0.2)

## 2020-10-29 MED ORDER — POTASSIUM CHLORIDE CRYS ER 20 MEQ PO TBCR
20.0000 meq | EXTENDED_RELEASE_TABLET | Freq: Two times a day (BID) | ORAL | Status: AC
  Administered 2020-10-29 (×2): 20 meq via ORAL
  Filled 2020-10-29 (×2): qty 1

## 2020-10-29 NOTE — Progress Notes (Signed)
PROGRESS NOTE   Subjective/Complaints:   No issues overnite   ROS: Patient denies CP, SOB, N/V/D Objective:   No results found. Recent Labs    10/29/20 0555  WBC 5.6  HGB 11.5*  HCT 34.3*  PLT 242     Recent Labs    10/29/20 0555  NA 138  K 3.2*  CL 109  CO2 21*  GLUCOSE 118*  BUN 19  CREATININE 1.94*  CALCIUM 9.6      Intake/Output Summary (Last 24 hours) at 10/29/2020 0945 Last data filed at 10/29/2020 0735 Gross per 24 hour  Intake 772 ml  Output --  Net 772 ml         Physical Exam: Vital Signs Blood pressure 129/88, pulse 78, temperature 98.2 F (36.8 C), temperature source Oral, resp. rate 20, height '5\' 4"'$  (1.626 m), weight 71.4 kg, SpO2 100 %.  General: No acute distress Mood and affect are appropriate Heart: Regular rate and rhythm no rubs murmurs or extra sounds Lungs: Clear to auscultation, breathing unlabored, no rales or wheezes Abdomen: Positive bowel sounds, soft nontender to palpation, nondistended Extremities: No clubbing, cyanosis, or edema Skin: No evidence of breakdown, no evidence of rash   Neurologic: intact, motor strength is 5/5 in right deltoid, bicep, tricep, grip, hip flexor, knee extensors, ankle dorsiflexor and plantar flexor 3-/5 Left deltoid, 3 - biceps, 2 - triceps , 0/5 grip, 2- HF, KE, 0/5 ADF Sensory exam normal sensation to light touch and proprioception in bilateral upper and lower extremities  Musculoskeletal: reduced left shoulder abduction, neg impingement sign , no Left hand tenderness or swelling. Left heel cord tight  Assessment/Plan: 1. Functional deficits which require 3+ hours per day of interdisciplinary therapy in a comprehensive inpatient rehab setting. Physiatrist is providing close team supervision and 24 hour management of active medical problems listed below. Physiatrist and rehab team continue to assess barriers to discharge/monitor patient  progress toward functional and medical goals  Care Tool:  Bathing    Body parts bathed by patient: Right arm, Chest, Abdomen, Right upper leg, Left upper leg, Face, Left lower leg, Front perineal area, Buttocks   Body parts bathed by helper: Left arm, Right lower leg     Bathing assist Assist Level: Minimal Assistance - Patient > 75%     Upper Body Dressing/Undressing Upper body dressing   What is the patient wearing?: Pull over shirt    Upper body assist Assist Level: Minimal Assistance - Patient > 75%    Lower Body Dressing/Undressing Lower body dressing      What is the patient wearing?: Pants     Lower body assist Assist for lower body dressing: Moderate Assistance - Patient 50 - 74%     Toileting Toileting    Toileting assist Assist for toileting: Maximal Assistance - Patient 25 - 49%     Transfers Chair/bed transfer  Transfers assist     Chair/bed transfer assist level: Minimal Assistance - Patient > 75% Chair/bed transfer assistive device: Armrests   Locomotion Ambulation   Ambulation assist      Assist level: Moderate Assistance - Patient 50 - 74% Assistive device: Walker-rolling Max distance: 25  Walk 10 feet activity   Assist  Walk 10 feet activity did not occur: Safety/medical concerns  Assist level: Moderate Assistance - Patient - 50 - 74% Assistive device: Walker-rolling   Walk 50 feet activity   Assist Walk 50 feet with 2 turns activity did not occur: Safety/medical concerns (no turns yet)  Assist level: Moderate Assistance - Patient - 50 - 74% Assistive device: Walker-rolling    Walk 150 feet activity   Assist Walk 150 feet activity did not occur: Safety/medical concerns         Walk 10 feet on uneven surface  activity   Assist Walk 10 feet on uneven surfaces activity did not occur: Safety/medical concerns         Wheelchair     Assist Will patient use wheelchair at discharge?:  (TBD) Type of  Wheelchair: Manual    Wheelchair assist level: Supervision/Verbal cueing Max wheelchair distance: 150    Wheelchair 50 feet with 2 turns activity    Assist        Assist Level: Supervision/Verbal cueing   Wheelchair 150 feet activity     Assist      Assist Level: Supervision/Verbal cueing   Blood pressure 129/88, pulse 78, temperature 98.2 F (36.8 C), temperature source Oral, resp. rate 20, height '5\' 4"'$  (1.626 m), weight 71.4 kg, SpO2 100 %.  Medical Problem List and Plan: 1.  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  Impaired mobility, ambulating >200 feet, d/c lovenox             -antiplatelet therapy: ASA/Brillinta 3. Pain Management: Tylenol prn.  4. Mood: LCSW to follow for evaluation and support.              -antipsychotic agents: N/A 5. Neuropsych: This patient is capable of making decisions on her own behalf. 6. Skin/Wound Care: Routine pressure relief measures.  7. Fluids/Electrolytes/Nutrition: Monitor I/O.Heart healthy diet. 8.  NSTEMI s/p PCI/DES: On ASA/Brillinta 9. Chronic combined CHF: Monitor for signs of overload. Check weights daily             --Continue Entresto, toprol XL Spironolactone and Crestor.             --SBP goal 130-150 to prevent cerebral hypoperfusion. Vitals:   10/28/20 2021 10/29/20 0407  BP: (!) 134/91 129/88  Pulse: 77 78  Resp: 20 20  Temp: 98.2 F (36.8 C) 98.2 F (36.8 C)  SpO2: 95% 100%  Borderline 8/8 10. T2DM: Hgb A1C-6.9. Monitor BS ac/hs. Educate on CM diet.Newly diagnosed CBG (last 3)  Recent Labs    10/28/20 1626 10/28/20 2117 10/29/20 0619  GLUCAP 130* 112* 120*   Controlled 8/8  -- Continue metformin bid (recently added)  11. Anemia: Likely due to DAPT/Lovenox. Reported to have some pink vaginal discharge--vaginally v/s bladder source?             --monitor for signs of  bleeding. Monitor serial H/H. 12. Constipation: BM yesterday. Continue Senna and warm prune juice. Does not want to try anything stronger for now. Decrease Senna to 1 tab HS 13. Hypokalemia: supplement 61mq today, monitor K+ weekly.  14. Intermittent SOB: Ordered incentive spirometer to improve breathing strength and ability over time. Provided incentive spirometer education. CXR obtained and stable  LOS: 20 days A FACE TO FACE EVALUATION WAS PERFORMED  Charlett Blake 10/29/2020, 9:45 AM

## 2020-10-29 NOTE — Progress Notes (Signed)
Physical Therapy Session Note  Patient Details  Name: BERNECE CASSADAY MRN: [id number] Date of Birth: Apr 29, 1960  Today's Date: 10/29/2020 PT Individual Time: [id number] PT Individual Time Calculation (min): 41 min   Short Term Goals: Week 3:  PT Short Term Goal 1 (Week 3): = to LTGs based on ELOS  Skilled Therapeutic Interventions/Progress Updates:  Patient supine in bed on entrance to room. Patient alert and agreeable to PT session. Patient denied pain during session.  Therapeutic Activity: Bed Mobility: Patient performed supine <> sit with supervision/ mod I. No vc/ tc req'd for good performance. Pt also able to scoot forward to EOB with no physical assist and use of bedrail. Transfers: Patient performed STS and SPVT transfers throughout session with close supervision. For SPVT bed <> w/c, pt actually moving therapist away and stating that therapist is in the way. Performs with no AFO and bil shoes donned. No block to LLE.   Gait Training:  Pt guided in stair negotiation training using BUE on handrails and leading with RLE to ascend with good clearance of LLE to each step and good L knee extension during RLE rise to next step. In descent, pt leads with LLEand is able to clear each step initially requiring extra time to clear final step. On second bout, pt demonstrating fatigue and increased weakness in hip flexion with difficulty lifting and clearing LLE in lowering to next step. Last step requires Min A to clear. Otherwise pt completes 2 bouts of four 6" steps with overall CGA.   Wheelchair Mobility:  Patient propelled wheelchair out of room and to main therapy gym 49' x1/ 43' x2/ 59' x1using RUE and RLE with overall supervision. Requiring intermittent CGA and vc for increasing effort in UE push to overcome unevenness in floor. Good technique showed throughout with good turns through doorways and around corners.   Patient supine in bed at end of session with brakes locked, bed alarm set,  and all needs within reach. Pt orients to current time and time/ therapist of next therapy session.   Therapy Documentation Precautions:  Precautions Precautions: Fall Precaution Comments: L hemiparesis Restrictions Weight Bearing Restrictions: No  Therapy/Group: Individual Therapy  Alger Simons PT, DPT 10/29/2020, 4:40 PM

## 2020-10-29 NOTE — Progress Notes (Addendum)
Speech Language Pathology Daily Session Note  Patient Details  Name: Tracey Meyer MRN: [id number] Date of Birth: November 18, 1960  Today's Date: 10/29/2020 SLP Individual Time: 0730-0800 SLP Individual Time Calculation (min): 30 min  Short Term Goals: Week 3: SLP Short Term Goal 1 (Week 3): Patient will utilize speech intelligibility strategies at the sentence level to maximize intelligibility and decrease shortness of breath with overall Sup A verbal cues. SLP Short Term Goal 2 (Week 3): Patient will demosntrate complex problem solving for functional tasks with supervision verbal cues. SLP Short Term Goal 3 (Week 3): Patient will self-monitor and correct erorrs during complex tasks with mod I. SLP Short Term Goal 4 (Week 3): Patient will utilize memory compensatory strategies to recall functional information with mod I  Skilled Therapeutic Interventions:   Skilled SLP intervention focused on cognition. Pt answered complex mental calculation questions using calendar and schedule with min A verbal cues. She completed problem solving/reasoning puzzles with words containing missing letters with min A and increased time needed for processing. Pt left seated upright in bed with bed alarm set and call button within reach. Cont with therapy per plan of care.   Pain Pain Assessment Pain Scale: Faces Pain Score: 0-No pain Faces Pain Scale: No hurt  Therapy/Group: Individual Therapy  Darrol Poke Mizael Sagar 10/29/2020, 11:28 AM

## 2020-10-29 NOTE — Progress Notes (Signed)
Occupational Therapy Session Note  Patient Details  Name: Tracey Meyer MRN: [id number] Date of Birth: 08/17/1960  Today's Date: 10/29/2020 OT Individual Time: 1018-1100 OT Individual Time Calculation (min): 42 min    Short Term Goals: Week 3:  OT Short Term Goal 1 (Week 3): pt will complete UB dressing with CGA using hemi techniques PRN OT Short Term Goal 2 (Week 3): pt will complete 2/3 toileting tasks with MIN A OT Short Term Goal 3 (Week 3): pt will participate in simple meal prep task with good safety awareness and overall MOD A with LRAD  Skilled Therapeutic Interventions/Progress Updates:    Pt completed functional mobility from the wheelchair at bedside to the toilet with mod assist and use of the RW with hand splint on the left for support.  She exhibits increased right lean during mobility with increased left knee hyper-extension.  Once in the shower, she was able to remove clothing sit to stand with min assist in preparation for bathing.  She completed bathing with min assist overall.  Min to mod assist for use of the LUE for washing the right arm.  She transferred to the toilet with mod assist from the shower prior to transfer to the wheelchair at min assist level stand pivot.  Still with increased trunk flexion with weightbearing over the LLE during transfer.  She completed UB dressing with supervision in sitting and LB dressing with overall mod assist sit to stand.  Mod assist still needed for crossing the LLE over the right knee and maintaining when applying all clothing to the RLE.  Finished session with pt in the wheelchair and the call button and phone in reach with safety alarm belt in place.  Encouraged pt to continue working on digit extension in the left hand while sitting.    Therapy Documentation Precautions:  Precautions Precautions: Fall Precaution Comments: L hemiparesis Restrictions Weight Bearing Restrictions: No   Pain: Pain Assessment Pain Scale:  Faces Pain Score: 0-No pain Faces Pain Scale: No hurt ADL: See Care Tool Section for some details of mobility and selfcare  Therapy/Group: Individual Therapy  Woody Kronberg OTR/L 10/29/2020, 12:30 PM
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
## 2020-10-30 LAB — GLUCOSE, CAPILLARY
Glucose-Capillary: 117 mg/dL — ABNORMAL HIGH (ref 70–99)
Glucose-Capillary: 171 mg/dL — ABNORMAL HIGH (ref 70–99)
Glucose-Capillary: 102 mg/dL — ABNORMAL HIGH (ref 70–99)
Glucose-Capillary: 164 mg/dL — ABNORMAL HIGH (ref 70–99)

## 2020-10-30 MED ORDER — POTASSIUM CHLORIDE CRYS ER 20 MEQ PO TBCR
20.0000 meq | EXTENDED_RELEASE_TABLET | Freq: Two times a day (BID) | ORAL | Status: AC
  Administered 2020-10-30: 20 meq via ORAL
  Filled 2020-10-30: qty 1

## 2020-10-30 NOTE — Progress Notes (Signed)
Patient ID: Tracey Meyer, female   DOB: 05-03-1960, 60 y.o.   MRN: [id number]  Patient or family must complete the sliding scale application before scheduling hospital follow up. Once family completes and submit back to facility. Facility will arrange a same day appointment for patient.   [facility] [address], [place] [address] P: [phone] F: [phone]

## 2020-10-30 NOTE — Plan of Care (Signed)
  Problem: RH Bathing Goal: LTG Patient will bathe all body parts with assist levels (OT) Description: LTG: Patient will bathe all body parts with assist levels (OT) Flowsheets (Taken 10/30/2020 0748) LTG: Pt will perform bathing with assistance level/cueing: (goal downgraded based on progress) Minimal Assistance - Patient > 75% Note: goal downgraded based on progress   Problem: RH Dressing Goal: LTG Patient will perform lower body dressing w/assist (OT) Description: LTG: Patient will perform lower body dressing with assist, with/without cues in positioning using equipment (OT) Flowsheets (Taken 10/30/2020 0748) LTG: Pt will perform lower body dressing with assistance level of: (goal downgraded based on progress) Minimal Assistance - Patient > 75% Note: goal downgraded based on progress   Problem: RH Toileting Goal: LTG Patient will perform toileting task (3/3 steps) with assistance level (OT) Description: LTG: Patient will perform toileting task (3/3 steps) with assistance level (OT)  Flowsheets (Taken 10/30/2020 0748) LTG: Pt will perform toileting task (3/3 steps) with assistance level: (goal downgraded based on progress) Minimal Assistance - Patient > 75% Note: goal downgraded based on progress   Problem: RH Functional Use of Upper Extremity Goal: LTG Patient will use RT/LT upper extremity as a (OT) Description: LTG: Patient will use right/left upper extremity as a stabilizer/gross assist/diminished/nondominant/dominant level with assist, with/without cues during functional activity (OT) Flowsheets (Taken 10/30/2020 0748) LTG: Use of upper extremity in functional activities: (goal downgraded based on progress) RUE as gross assist level LTG: Pt will use upper extremity in functional activity with assistance level of: Supervision/Verbal cueing   Problem: RH Simple Meal Prep Goal: LTG Patient will perform simple meal prep w/assist (OT) Description: LTG: Patient will perform simple meal prep  with assistance, with/without cues (OT). Flowsheets (Taken 10/30/2020 0748) LTG: Pt will perform simple meal prep with assistance level of: (goal downgraded based on progress) Minimal Assistance - Patient > 75% LTG: Pt will perform simple meal prep w/level of: Wheelchair level Note: goal downgraded based on progress   Problem: RH Toilet Transfers Goal: LTG Patient will perform toilet transfers w/assist (OT) Description: LTG: Patient will perform toilet transfers with assist, with/without cues using equipment (OT) Flowsheets (Taken 10/30/2020 0748) LTG: Pt will perform toilet transfers with assistance level of: (goal downgraded based on progress) Minimal Assistance - Patient > 75% Note: goal downgraded based on progress   Problem: RH Tub/Shower Transfers Goal: LTG Patient will perform tub/shower transfers w/assist (OT) Description: LTG: Patient will perform tub/shower transfers with assist, with/without cues using equipment (OT) Flowsheets (Taken 10/30/2020 0748) LTG: Pt will perform tub/shower stall transfers with assistance level of: (goal downgraded based on progress) Minimal Assistance - Patient > 75% Note: goal downgraded based on progress

## 2020-10-30 NOTE — Plan of Care (Signed)
  Problem: Sit to Stand Goal: LTG:  Patient will perform sit to stand with assistance level (PT) Description: LTG:  Patient will perform sit to stand with assistance level (PT) Flowsheets (Taken 10/30/2020 1844) LTG: PT will perform sit to stand in preparation for functional mobility with assistance level: (downgraded based on pt progress) Contact Guard/Touching assist Note: downgraded based on pt progress   Problem: RH Bed Mobility Goal: LTG Patient will perform bed mobility with assist (PT) Description: LTG: Patient will perform bed mobility with assistance, with/without cues (PT). Flowsheets (Taken 10/30/2020 1845) LTG: Pt will perform bed mobility with assistance level of: (downgraded based on pt progress) Contact Guard/Touching assist Note: downgraded based on pt progress   Problem: RH Bed to Chair Transfers Goal: LTG Patient will perform bed/chair transfers w/assist (PT) Description: LTG: Patient will perform bed to chair transfers with assistance (PT). Flowsheets (Taken 10/30/2020 1845) LTG: Pt will perform Bed to Chair Transfers with assistance level: (downgraded based on pt progress) Contact Guard/Touching assist Note: downgraded based on pt progress   Problem: RH Ambulation Goal: LTG Patient will ambulate in controlled environment (PT) Description: LTG: Patient will ambulate in a controlled environment, # of feet with assistance (PT). Flowsheets (Taken 10/30/2020 1845) LTG: Pt will ambulate in controlled environ  assist needed:: (downgraded based on pt progress) Minimal Assistance - Patient > 75% LTG: Ambulation distance in controlled environment: 77f using LRAD Note: downgraded based on pt progress Goal: LTG Patient will ambulate in home environment (PT) Description: LTG: Patient will ambulate in home environment, # of feet with assistance (PT). Flowsheets (Taken 10/30/2020 1845) LTG: Pt will ambulate in home environ  assist needed:: (downgraded based on pt progress) Minimal  Assistance - Patient > 75% LTG: Ambulation distance in home environment: 216fusing LRAD Note: downgraded based on pt progress   Problem: RH Wheelchair Mobility Goal: LTG Patient will propel w/c in controlled environment (PT) Description: LTG: Patient will propel wheelchair in controlled environment, # of feet with assist (PT) Flowsheets (Taken 10/30/2020 1849) LTG: Pt will propel w/c in controlled environ  assist needed:: (added goal for increased independence with functional mobility) Set up assist LTG: Propel w/c distance in controlled environment: [id number]: added goal for increased independence with functional mobility Goal: LTG Patient will propel w/c in home environment (PT) Description: LTG: Patient will propel wheelchair in home environment, # of feet with assistance (PT). Flowsheets (Taken 10/30/2020 1849) LTG: Pt will propel w/c in home environ  assist needed:: (added goal for increased independence with functional mobility) Set up assist LTG: Propel w/c distance in home environment: [id number]: added goal for increased independence with functional mobility

## 2020-10-30 NOTE — Progress Notes (Addendum)
Patient ID: Tracey Meyer, female   DOB: 08/23/1960, 60 y.o.   MRN: [id number]  SW made attempt to call patient S.O, no answer- left voicemail. SW made attempt to call pt sister in town, no answer. SW was able to reach patient sister [place] in reference to patient discharge and family education. Patient sister will make some attempts to reach patient S.O or other sister to schedule.   [place], [place]

## 2020-10-30 NOTE — Progress Notes (Signed)
Patient ID: Tracey Meyer, female   DOB: [birth year], 60 y.o.   MRN: [id number]  Sw spoke to patient sisters again. Sisters unable to reach patient S.O. Sister believes S.O has misplaced cell phone with no other way of contact. SW will continue to follow up  Erlene Quan, [place]

## 2020-10-30 NOTE — Progress Notes (Signed)
Patient refusing resting hand splint and PRAFO tonight. Patient reports "can't sleep with them on". Education provided.
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
## 2020-10-30 NOTE — Plan of Care (Signed)
  Problem: RH BOWEL ELIMINATION Goal: RH STG MANAGE BOWEL WITH ASSISTANCE Description: STG Manage Bowel with mod I Assistance. Outcome: Progressing Goal: RH STG MANAGE BOWEL W/MEDICATION W/ASSISTANCE Description: STG Manage Bowel with Medication with mod I Assistance. Outcome: Progressing   Problem: RH SAFETY Goal: RH STG ADHERE TO SAFETY PRECAUTIONS W/ASSISTANCE/DEVICE Description: STG Adhere to Safety Precautions With cues/reminders Assistance/Device. Outcome: Progressing   Problem: RH KNOWLEDGE DEFICIT Goal: RH STG INCREASE KNOWLEDGE OF DIABETES Description: Patient and significant other will be able to manage DM with medications and dietary modifications using handouts and educational resources with cues/reminders Outcome: Progressing Goal: RH STG INCREASE KNOWLEDGE OF HYPERTENSION Description: Patient and significant other will be able to manage HTN with medications and dietary modifications using handouts and educational resources with cues/reminders Outcome: Progressing Goal: RH STG INCREASE KNOWLEGDE OF HYPERLIPIDEMIA Description: Patient and significant other will be able to manage HLD with medications and dietary modifications using handouts and educational resources with cues/reminders Outcome: Progressing Goal: RH STG INCREASE KNOWLEDGE OF STROKE PROPHYLAXIS Description: Patient and significant other will be able to manage secondary stroke risks medications and dietary modifications using handouts and educational resources with cues/reminders Outcome: Progressing   Problem: Consults Goal: RH STROKE PATIENT EDUCATION Description: See Patient Education module for education specifics  Outcome: Progressing

## 2020-10-30 NOTE — Progress Notes (Signed)
PROGRESS NOTE   Subjective/Complaints:   Pt concerned about going back home , did not have water at home   ROS: Patient denies CP, SOB, N/V/D Objective:   No results found. Recent Labs    10/29/20 0555  WBC 5.6  HGB 11.5*  HCT 34.3*  PLT 242      Recent Labs    10/29/20 0555  NA 138  K 3.2*  CL 109  CO2 21*  GLUCOSE 118*  BUN 19  CREATININE 1.94*  CALCIUM 9.6       Intake/Output Summary (Last 24 hours) at 10/30/2020 0850 Last data filed at 10/30/2020 0740 Gross per 24 hour  Intake 1000 ml  Output --  Net 1000 ml         Physical Exam: Vital Signs Blood pressure 130/79, pulse 76, temperature 98.1 F (36.7 C), temperature source Oral, resp. rate 16, height '5\' 4"'$  (1.626 m), weight 71.4 kg, SpO2 100 %.   General: No acute distress Mood and affect are appropriate Heart: Regular rate and rhythm no rubs murmurs or extra sounds Lungs: Clear to auscultation, breathing unlabored, no rales or wheezes Abdomen: Positive bowel sounds, soft nontender to palpation, nondistended Extremities: No clubbing, cyanosis, or edema Skin: No evidence of breakdown, no evidence of rash  Neurologic: intact, motor strength is 5/5 in right deltoid, bicep, tricep, grip, hip flexor, knee extensors, ankle dorsiflexor and plantar flexor 3-/5 Left deltoid, 3 - biceps, 2 - triceps , 0/5 grip, 2- HF, KE, 0/5 ADF Sensory exam normal sensation to light touch and proprioception in bilateral upper and lower extremities  Musculoskeletal: reduced left shoulder abduction, neg impingement sign , no Left hand tenderness or swelling. Left heel cord tight  Assessment/Plan: 1. Functional deficits which require 3+ hours per day of interdisciplinary therapy in a comprehensive inpatient rehab setting. Physiatrist is providing close team supervision and 24 hour management of active medical problems listed below. Physiatrist and rehab team continue  to assess barriers to discharge/monitor patient progress toward functional and medical goals  Care Tool:  Bathing    Body parts bathed by patient: Right arm, Chest, Abdomen, Right upper leg, Left upper leg, Face, Left lower leg, Front perineal area, Buttocks, Right lower leg   Body parts bathed by helper: Left arm     Bathing assist Assist Level: Minimal Assistance - Patient > 75%     Upper Body Dressing/Undressing Upper body dressing   What is the patient wearing?: Pull over shirt    Upper body assist Assist Level: Supervision/Verbal cueing    Lower Body Dressing/Undressing Lower body dressing      What is the patient wearing?: Pants     Lower body assist Assist for lower body dressing: Minimal Assistance - Patient > 75%     Toileting Toileting    Toileting assist Assist for toileting: Minimal Assistance - Patient > 75% (no clothing to manage secondary to just coming from the shower)     Transfers Chair/bed transfer  Transfers assist     Chair/bed transfer assist level: Minimal Assistance - Patient > 75% Chair/bed transfer assistive device: Armrests   Locomotion Ambulation   Ambulation assist  Assist level: Minimal Assistance - Patient > 75% Assistive device: Walker-rolling Max distance: 10'   Walk 10 feet activity   Assist  Walk 10 feet activity did not occur: Safety/medical concerns  Assist level: Minimal Assistance - Patient > 75% Assistive device: Walker-rolling   Walk 50 feet activity   Assist Walk 50 feet with 2 turns activity did not occur: Safety/medical concerns  Assist level: Moderate Assistance - Patient - 50 - 74% Assistive device: Walker-rolling    Walk 150 feet activity   Assist Walk 150 feet activity did not occur: Safety/medical concerns         Walk 10 feet on uneven surface  activity   Assist Walk 10 feet on uneven surfaces activity did not occur: Safety/medical concerns   Assist level: Minimal  Assistance - Patient > 75% Assistive device: Aeronautical engineer Will patient use wheelchair at discharge?:  (TBD) Type of Wheelchair: Manual    Wheelchair assist level: Supervision/Verbal cueing Max wheelchair distance: 150    Wheelchair 50 feet with 2 turns activity    Assist        Assist Level: Supervision/Verbal cueing   Wheelchair 150 feet activity     Assist      Assist Level: Supervision/Verbal cueing   Blood pressure 130/79, pulse 76, temperature 98.1 F (36.7 C), temperature source Oral, resp. rate 16, height '5\' 4"'$  (1.626 m), weight 71.4 kg, SpO2 100 %.  Medical Problem List and Plan: 1.  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  Impaired mobility, ambulating >200 feet, d/c lovenox             -antiplatelet therapy: ASA/Brillinta 3. Pain Management: Tylenol prn.  4. Mood: LCSW to follow for evaluation and support.              -antipsychotic agents: N/A 5. Neuropsych: This patient is capable of making decisions on her own behalf. 6. Skin/Wound Care: Routine pressure relief measures.  7. Fluids/Electrolytes/Nutrition: Monitor I/O.Heart healthy diet. 8.  NSTEMI s/p PCI/DES: On ASA/Brillinta 9. Chronic combined CHF: Monitor for signs of overload. Check weights daily             --Continue Entresto, toprol XL Spironolactone and Crestor.             --SBP goal 130-150 to prevent cerebral hypoperfusion. Vitals:   10/29/20 1955 10/30/20 0612  BP: 140/72 130/79  Pulse: 73 76  Resp: 18 16  Temp: 99.3 F (37.4 C) 98.1 F (36.7 C)  SpO2: 100% 100%  BP controlled 8/9 10. T2DM: Hgb A1C-6.9. Monitor BS ac/hs. Educate on CM diet.Newly diagnosed CBG (last 3)  Recent Labs    10/29/20 1636  10/29/20 2104 10/30/20 0613  GLUCAP 114* 129* 117*   Controlled 8/9  -- Continue metformin bid (recently added)  11. Anemia: Likely due to DAPT/Lovenox. Reported to have some pink vaginal discharge--vaginally v/s bladder source?             --monitor for signs of bleeding. Monitor serial H/H. 12. Constipation: BM yesterday. Continue Senna and warm prune juice. Does not want to try  anything stronger for now. Decrease Senna to 1 tab HS 13. Hypokalemia: supplement 70mq today, monitor K+ weekly.  14. Intermittent SOB: Ordered incentive spirometer to improve breathing strength and ability over time. Provided incentive spirometer education. CXR obtained and stable     L[place]: 21 days [place] J[place] Feider 10/30/2020, 8:50 AM

## 2020-10-30 NOTE — Progress Notes (Signed)
Speech Language Pathology Daily Session Note  Patient Details  Name: Tracey Meyer MRN: [id number] Date of Birth: 1960/04/16  Today's Date: 10/30/2020 SLP Individual Time: 1420-1505 SLP Individual Time Calculation (min): 45 min  Short Term Goals: Week 3: SLP Short Term Goal 1 (Week 3): Patient will utilize speech intelligibility strategies at the sentence level to maximize intelligibility and decrease shortness of breath with overall Sup A verbal cues. SLP Short Term Goal 2 (Week 3): Patient will demosntrate complex problem solving for functional tasks with supervision verbal cues. SLP Short Term Goal 3 (Week 3): Patient will self-monitor and correct erorrs during complex tasks with mod I. SLP Short Term Goal 4 (Week 3): Patient will utilize memory compensatory strategies to recall functional information with mod I  Skilled Therapeutic Interventions: Patient agreeable to skilled ST intervention with focus on cognitive goals. SLP facilitated session with sup A verbal cues and additional processing time for mildly complex problem solving/deduction puzzles, word scrambles, and logical reasoning. Patient demonstrated improved implementation of speech intelligibility strategies and was perceived as 90% intelligible at conversation level with intermittent cues to increase vocal loudness and decrease speech rate. Patient was left in bed with alarm activated and immediate needs within reach at end of session. Continue per current plan of care.      Pain Pain Assessment Pain Scale: 0-10 Pain Score: 0-No pain  Therapy/Group: Individual Therapy  Patty Sermons 10/30/2020, 3:47 PM

## 2020-10-30 NOTE — Progress Notes (Addendum)
Occupational Therapy Session Note  Patient Details  Name: Tracey Meyer MRN: [id number] Date of Birth: [birth year]  Today's Date: 10/30/2020 OT Individual Time: [id number] OT Individual Time Calculation (min): 58 min    Short Term Goals: Week 3:  OT Short Term Goal 1 (Week 3): pt will complete UB dressing with CGA using hemi techniques PRN OT Short Term Goal 2 (Week 3): pt will complete 2/3 toileting tasks with MIN A OT Short Term Goal 3 (Week 3): pt will participate in simple meal prep task with good safety awareness and overall MOD A with LRAD  Skilled Therapeutic Interventions/Progress Updates:    Session 1: [id number])  Pt up in wheelchair to start with therapist transporting her to the dayroom for session.  She was able to transfer to the therapy mat with mod assist stand pivot and then to supine at min assist level.  Therapist utilized Marketing executive for work on BUE shoulder flexion, with use of an ace bandage to help keep the left hand on the ball.  Mod assist needed to achieve and maintain shoulder flexion with elbow extension on the left as the arm tends to maintain flexed position.  Completed place and hold exercises at approximately 90 degrees shoulder flexion for greater than 30 seconds while holding onto the ball for 4-5 intervals.  Incorporated small movements of shoulder flexion and extension from this position as well while trying to maintain elbow extension.  Transitioned to sitting with use of the UE Ranger for active shoulder flexion and external rotation as well as elbow extension.  She was able to complete active movements, but still demonstrates increased tone into internal rotation and adduction.  With attempted PROM wrist and digit extension during therapy she exhibits increased left metacarpal/phalange pain on the thumb.  Completed gentle mobilizations and stretching to the joint with application of heat at conclusion of session.  Pt was transferred back to the wheelchair with  mod assist and then returned to the room.  Call button and phone in reach with safety belt in place.    Session 2: [id number])  Pt worked on Brewing technologist for the LUE and LLE during session.  Had her work on standing at the sink with LUE placed in weightbearing while stimulation was applied to the dorsal left forearm to help facilitate digit extension.  Min assist for sit to stand overall with mod assist to maintain standing while completing sit to squat and holding squat position with emphasis on weightshift to the left.  Stimulation was provided with Saebo Stim One with intensity on level 12 at the below parameters for 20 mins.  No adverse reactions or pain noted to stimulation.   Finished session with pt resting in the wheelchair with call button and phone in reach and safety belt in place.     Therapy Documentation Precautions:  Precautions Precautions: Fall Precaution Comments: L hemiparesis Restrictions Weight Bearing Restrictions: No  Pain: Pain Assessment Pain Scale: Faces Faces Pain Scale: Hurts little more Pain Type: Acute pain Pain Location: Hand Pain Orientation: Left Pain Descriptors / Indicators: Discomfort Pain Onset: With Activity Pain Intervention(s): Repositioned;Heat applied;Emotional support ADL: See Care Tool Section for some details of mobility and selfcare  Therapy/Group: Individual Therapy  Scotlyn Mccranie OTR/L 10/30/2020, 12:38 PM

## 2020-10-30 NOTE — Progress Notes (Signed)
Patient ID: Tracey Meyer, female   DOB: [birth year], 60 y.o.   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    [place], [place]

## 2020-10-31 LAB — BASIC METABOLIC PANEL
Anion gap: 10 (ref 5–15)
Anion gap: 8 (ref 5–15)
BUN: 13 mg/dL (ref 6–20)
BUN: 12 mg/dL (ref 6–20)
CO2: 20 mmol/L — ABNORMAL LOW (ref 22–32)
CO2: 20 mmol/L — ABNORMAL LOW (ref 22–32)
Calcium: 9.4 mg/dL (ref 8.9–10.3)
Calcium: 9.9 mg/dL (ref 8.9–10.3)
Chloride: 109 mmol/L (ref 98–111)
Chloride: 111 mmol/L (ref 98–111)
Creatinine, Ser: 1.9 mg/dL — ABNORMAL HIGH (ref 0.44–1.00)
Creatinine, Ser: 1.89 mg/dL — ABNORMAL HIGH (ref 0.44–1.00)
GFR, Estimated: 30 mL/min — ABNORMAL LOW (ref >60)
GFR, Estimated: 30 mL/min — ABNORMAL LOW (ref >60)
Glucose, Bld: 141 mg/dL — ABNORMAL HIGH (ref 70–99)
Glucose, Bld: 121 mg/dL — ABNORMAL HIGH (ref 70–99)
Potassium: 3.4 mmol/L — ABNORMAL LOW (ref 3.5–5.1)
Potassium: 3.9 mmol/L (ref 3.5–5.1)
Sodium: 139 mmol/L (ref 135–145)
Sodium: 139 mmol/L (ref 135–145)

## 2020-10-31 LAB — GLUCOSE, CAPILLARY
Glucose-Capillary: 122 mg/dL — ABNORMAL HIGH (ref 70–99)
Glucose-Capillary: 128 mg/dL — ABNORMAL HIGH (ref 70–99)
Glucose-Capillary: 112 mg/dL — ABNORMAL HIGH (ref 70–99)
Glucose-Capillary: 105 mg/dL — ABNORMAL HIGH (ref 70–99)

## 2020-10-31 MED ORDER — SODIUM CHLORIDE 0.45 % IV SOLN
INTRAVENOUS | Status: AC
  Filled 2020-10-31: qty 1000

## 2020-10-31 MED ORDER — POTASSIUM CHLORIDE 10 MEQ/100ML IV SOLN
10.0000 meq | INTRAVENOUS | Status: AC
  Administered 2020-10-31: 10 meq via INTRAVENOUS
  Filled 2020-10-31 (×3): qty 100

## 2020-10-31 MED ORDER — SODIUM CHLORIDE 0.45 % IV BOLUS
500.0000 mL | Freq: Once | INTRAVENOUS | Status: DC
  Administered 2020-10-31: 500 mL via INTRAVENOUS

## 2020-10-31 MED ORDER — POTASSIUM CHLORIDE 10 MEQ/100ML IV SOLN
10.0000 meq | INTRAVENOUS | Status: AC
Start: 2020-10-31 — End: 2020-10-31
  Administered 2020-10-31 (×2): 10 meq via INTRAVENOUS
  Filled 2020-10-31 (×2): qty 100

## 2020-10-31 NOTE — Progress Notes (Signed)
Patient ID: Tracey Meyer, female   DOB: [birth year], 60 y.o.   MRN: [id number]  Sw had made attempt to reach patient family members listed. Unable to leave VM on S.O cell Left [name] VM x2 Left [name] a VM asking location sister to please call SW.  Sw will continue to follow up  Erlene Quan, [place]

## 2020-10-31 NOTE — Progress Notes (Addendum)
PROGRESS NOTE   Subjective/Complaints: Discussed renal fxn with pt and family member  Pt became labile when discussing treatment plan   ROS: Patient denies CP, SOB, N/V/D Objective:   No results found. Recent Labs    10/29/20 0555  WBC 5.6  HGB 11.5*  HCT 34.3*  PLT 242      Recent Labs    10/29/20 0555 10/31/20 0046  NA 138 139  K 3.2* 3.4*  CL 109 109  CO2 21* 20*  GLUCOSE 118* 141*  BUN 19 13  CREATININE 1.94* 1.90*  CALCIUM 9.6 9.4       Intake/Output Summary (Last 24 hours) at 10/31/2020 0845 Last data filed at 10/31/2020 0840 Gross per 24 hour  Intake 712 ml  Output --  Net 712 ml         Physical Exam: Vital Signs Blood pressure 113/68, pulse 69, temperature 98 F (36.7 C), temperature source Oral, resp. rate 20, height '5\' 4"'$  (1.626 m), weight 71.4 kg, SpO2 100 %.   General: No acute distress Mood and affect are appropriate Heart: Regular rate and rhythm no rubs murmurs or extra sounds Lungs: Clear to auscultation, breathing unlabored, no rales or wheezes Abdomen: Positive bowel sounds, soft nontender to palpation, nondistended Extremities: No clubbing, cyanosis, or edema Skin: No evidence of breakdown, no evidence of rash  Neurologic: intact, motor strength is 5/5 in right deltoid, bicep, tricep, grip, hip flexor, knee extensors, ankle dorsiflexor and plantar flexor 3-/5 Left deltoid, 3 - biceps, 2 - triceps , 0/5 grip, 2- HF, KE, 0/5 ADF Sensory exam normal sensation to light touch and proprioception in bilateral upper and lower extremities  Musculoskeletal: reduced left shoulder abduction, neg impingement sign , no Left hand tenderness or swelling. Left heel cord tight  Assessment/Plan: 1. Functional deficits which require 3+ hours per day of interdisciplinary therapy in a comprehensive inpatient rehab setting. Physiatrist is providing close team supervision and 24 hour management  of active medical problems listed below. Physiatrist and rehab team continue to assess barriers to discharge/monitor patient progress toward functional and medical goals  Care Tool:  Bathing    Body parts bathed by patient: Right arm, Chest, Abdomen, Right upper leg, Left upper leg, Face, Left lower leg, Front perineal area, Buttocks, Right lower leg   Body parts bathed by helper: Left arm     Bathing assist Assist Level: Minimal Assistance - Patient > 75%     Upper Body Dressing/Undressing Upper body dressing   What is the patient wearing?: Pull over shirt    Upper body assist Assist Level: Supervision/Verbal cueing    Lower Body Dressing/Undressing Lower body dressing      What is the patient wearing?: Pants     Lower body assist Assist for lower body dressing: Minimal Assistance - Patient > 75%     Toileting Toileting    Toileting assist Assist for toileting: Minimal Assistance - Patient > 75% (no clothing to manage secondary to just coming from the shower)     Transfers Chair/bed transfer  Transfers assist     Chair/bed transfer assist level: Contact Guard/Touching assist Chair/bed transfer assistive device: Armrests   Locomotion Ambulation  Ambulation assist      Assist level: Minimal Assistance - Patient > 75% Assistive device: Walker-rolling Max distance: 24f   Walk 10 feet activity   Assist  Walk 10 feet activity did not occur: Safety/medical concerns  Assist level: Minimal Assistance - Patient > 75% Assistive device: Walker-rolling   Walk 50 feet activity   Assist Walk 50 feet with 2 turns activity did not occur: Safety/medical concerns  Assist level: Moderate Assistance - Patient - 50 - 74% Assistive device: Walker-rolling    Walk 150 feet activity   Assist Walk 150 feet activity did not occur: Safety/medical concerns         Walk 10 feet on uneven surface  activity   Assist Walk 10 feet on uneven surfaces activity  did not occur: Safety/medical concerns   Assist level: Minimal Assistance - Patient > 75% Assistive device: WAeronautical engineerWill patient use wheelchair at discharge?: Yes Type of Wheelchair: Manual    Wheelchair assist level: Set up assist, Supervision/Verbal cueing Max wheelchair distance: 1550f   Wheelchair 50 feet with 2 turns activity    Assist        Assist Level: Supervision/Verbal cueing   Wheelchair 150 feet activity     Assist      Assist Level: Supervision/Verbal cueing   Blood pressure 113/68, pulse 69, temperature 98 F (36.7 C), temperature source Oral, resp. rate 20, height '5\' 4"'$  (1.626 m), weight 71.4 kg, SpO2 100 %.  Medical Problem List and Plan: 1.  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  Impaired mobility, ambulating >200 feet, d/c lovenox             -antiplatelet therapy: ASA/Brillinta 3. Pain Management: Tylenol prn.  4. Mood: LCSW to follow for evaluation and support.              -antipsychotic agents: N/A 5. Neuropsych: This patient is capable of making decisions on her own behalf. 6. Skin/Wound Care: Routine pressure relief measures.  7. Fluids/Electrolytes/Nutrition: Monitor I/O.Heart healthy diet. 8.  NSTEMI s/p PCI/DES: On ASA/Brillinta 9. Chronic combined CHF: Monitor for signs of overload. Check weights daily No peripheral edema, monitor after fluid bolus and KCL riders              --Continue Entresto, toprol XL Spironolactone and Crestor.             --SBP goal 130-150 to prevent cerebral hypoperfusion. Vitals:   10/30/20 2040 10/31/20 0601  BP: (!) 143/91 113/68  Pulse: 72 69  Resp: 18 20  Temp: 98.2 F (36.8 C) 98 F (36.7 C)  SpO2: 100% 100%  BP  controlled 8/10 but below goal 10. T2DM: Hgb A1C-6.9. Monitor BS ac/hs. Educate on CM diet.Newly diagnosed CBG (last 3)  Recent Labs    10/30/20 1710 10/30/20 2106 10/31/20 0602  GLUCAP 102* 164* 122*   Controlled 8/10  -- Continue metformin bid (recently added)  11. Anemia: Likely due to DAPT/Lovenox. Reported to have some pink vaginal discharge--vaginally v/s bladder source?             --  monitor for signs of bleeding. Monitor serial H/H. 12. Constipation: BM yesterday. Continue Senna and warm prune juice. Does not want to try anything stronger for now. Decrease Senna to 1 tab HS 13. Hypokalemia: supplement 33mq today, monitor K+ weekly.  14. Intermittent SOB: Ordered incentive spirometer to improve breathing strength and ability over time. Provided incentive spirometer education. CXR obtained and stable   15.  AKI likely pre renal exacerbated by valsartan   LOS: 22 days [place] Test 10/31/2020, 8:45 AM

## 2020-10-31 NOTE — Progress Notes (Signed)
Occupational Therapy Session Note  Patient Details  Name: Tracey Meyer MRN: [id number] Date of Birth: Jul 25, 1960  Today's Date: 10/31/2020 OT Individual Time: [id number] OT Individual Time Calculation (min): 68 min    Short Term Goals: Week 3:  OT Short Term Goal 1 (Week 3): pt will complete UB dressing with CGA using hemi techniques PRN OT Short Term Goal 2 (Week 3): pt will complete 2/3 toileting tasks with MIN A OT Short Term Goal 3 (Week 3): pt will participate in simple meal prep task with good safety awareness and overall MOD A with LRAD  Skilled Therapeutic Interventions/Progress Updates:    Session 1: [id number])  Pt in wheelchair to start agreeable to ADL session.  She needed mod assist for functional mobility to the shower seat from the wheelchair at bedside without an assistive device.  She continues to exhibit increased right lean and trunk flexion with stance phase on the LLE as well as fluctuating left knee hyper-extension secondary to not being able to maintain hip extension.  Min assist for removal of LB clothing prior to shower with supervision for removal of pullover shirt.  She was able to complete all bathing with min assist and mod facilitation for incorporating the LUE for washing the right arm.  Min assist for sit to stand as well when washing and drying her buttocks.  Transferred to the wheelchair at mod assist stand pivot for dressing tasks at the sink.  Supervision for donning a pullover shirt with min assist for donning pants.  Mod assist for donning gripper socks as well with completion of oral hygiene with setup from the wheelchair.  Therapist finished session with adjustment of her night time resting hand splint.  She reports increased pain in the thumb MP/IP junction.  Able to adjust splint and donn without report of pain.  Will see how she tolerates tonight with nursing applying it.  Call button and phone in reach with safety alarm belt in place.    Session 2:  [id number])  Pt in bed receiving IV fluid transfusion.  Worked on LUE neuromuscular re-education from this position for comfort and the helf decrease trunk compensation.  Pt with increased pain in the right arm where IV infusion was being completed.  She reports that nursing is aware of this and no noted swelling or redness at the sight.  Therapist applied NMES to the left dorsal forearm for facilitation of digit extensors.  Had her complete simulated functional reach to  target with the LUE while digit extensors were being facilitated.  She needed mod assist for elbow extension to target at approximately 90 degrees shoulder flexion from supine position.  She tolerated approximately 20 mins of active stimulation at intensity of 10, progressing to 12 secondary to increased digit flexor tone during functional reach.  No redness or pain reported after completion of use.  Finished session with pt resting in bed with the call button and phone in reach.  Parameters for NMES listed below.  Saebo Stim One 330 pulse width 35 Hz pulse rate On 8 sec/ off 8 sec Ramp up/ down 2 sec Symmetrical Biphasic wave form  Max intensity 124m at 500 Ohm load      Therapy Documentation Precautions:  Precautions Precautions: Fall Precaution Comments: L hemiparesis Restrictions Weight Bearing Restrictions: No   Pain: Pain Assessment Pain Scale: Faces Pain Score: 0-No pain Faces Pain Scale: Hurts a little bit Pain Type: Acute pain Pain Location: Hand Pain Orientation: Left Pain Onset: With  Activity Pain Intervention(s): Repositioned;Heat applied ADL: See Care Tool Section for some details of mobility and selfcare   Therapy/Group: Individual Therapy  Shailyn Weyandt OTR/L 10/31/2020, 11:06 AM

## 2020-10-31 NOTE — Progress Notes (Signed)
Orthopedic Tech Progress Note Patient Details:  Tracey Meyer [birth year] [id number] Called order into [facility] Patient ID: Tracey Meyer, female   DOB: 17-Apr-1960, 60 y.o.   MRN: [id number]  [name] 10/31/2020, 12:37 PM

## 2020-10-31 NOTE — Progress Notes (Signed)
Physical Therapy Session Note  Patient Details  Name: Tracey Meyer MRN: [id number] Date of Birth: 09/10/1960  Today's Date: 10/31/2020 PT Individual Time: 1108-1203 PT Individual Time Calculation (min): 55 min   Short Term Goals: Week 3:  PT Short Term Goal 1 (Week 3): = to LTGs based on ELOS  Skilled Therapeutic Interventions/Progress Updates:    Pt received sitting in w/c and agreeable to therapy session. Therapist educating pt on D/C plans regarding upcoming D/C on Friday 8/12 pending medical needs with plan to schedule family education on day of D/C as pt's family unable to arrange frequent transportation to/from hospital. Discussed DME ordered through [facility] and plan for [name], orthotist from [facility], planning to come in today to perform a cast of her L LE in order to create custom AFO as pt needs increased rigidity of a brace that cannot be achieved via an off-the-shelf carbon fiber brace. Pt becomes emotional and tearful when discussing plan of care with pt praying - throughout session pt appears fixated on the fact that she is going to need an IV. Therapist provided emotional support and encouragement with pt agreeable to continue with session.  Transported to/from gym in w/c for time management and energy conservation.   Donned L LE knee cage to block hyperextension and L LE DF assist ACE wrap. Sit<>stands w/c<>RW with CGA for steadying during session -  cuing to push up with L hand from w/c prior to placing on RW orthotic to promote [name] and NMR.  Gait training ~69f, ~712f ~503fsing RW with heavy min assist of 1 for balance and cuing for improved gait mechanics - pt continues to demonstrate greatest impairment of excessive R anterior trunk flexion with excessive hip flexion and increased WBing through R UE on RW resulting in poor R/L pelvic weight shift onto stance limb with lack of upright posture  - therapist providing multi-modal cuing for improvement and had +2 provide mirror  feedback on 2nd gait trail to allow visual feedback. Had pt perform static standing R/L weight shifting while sustaining upright, hip extended posture for NMR and after that pt demos improved upright posture throughout though continues to require frequent verbal/tactile cuing for "chest up" or "stand tall."   Transported back to room and pt requesting to return to bed. R stand pivot w/c>EOB using R UE support on grab bar with supervision. Sit>supine, HOB flat and not using bedrail, with supervision. Pt left supine in bed with needs in reach and bed alarm on.  Therapy Documentation Precautions:  Precautions Precautions: Fall Precaution Comments: L hemiparesis Restrictions Weight Bearing Restrictions: No   Pain:  No reports of pain throughout session.    Therapy/Group: Individual Therapy  CarTawana ScalePT, DPT, NCS, CSRS 10/31/2020, 8:03 AM

## 2020-10-31 NOTE — Progress Notes (Signed)
Speech Language Pathology Daily Session Note  Patient Details  Name: AVILA SANDINE MRN: [id number] Date of Birth: 1960/07/07  Today's Date: 10/31/2020 SLP Individual Time: [id number] SLP Individual Time Calculation (min): 45 min  Short Term Goals: Week 3: SLP Short Term Goal 1 (Week 3): Patient will utilize speech intelligibility strategies at the sentence level to maximize intelligibility and decrease shortness of breath with overall Sup A verbal cues. SLP Short Term Goal 2 (Week 3): Patient will demosntrate complex problem solving for functional tasks with supervision verbal cues. SLP Short Term Goal 3 (Week 3): Patient will self-monitor and correct erorrs during complex tasks with mod I. SLP Short Term Goal 4 (Week 3): Patient will utilize memory compensatory strategies to recall functional information with mod I  Skilled Therapeutic Interventions: Patient agreeable to skilled ST intervention with focus on cognitive goals. Addressed medication management again as patient has limited support at home and was responsible for managing her husband's at prior level. Patient reported her husband has been receiving support from his sister to manage his medicine while patient has been in the hospital. Patient reported the sister would be able to continue to provide support for husband even after patient discharges. Facilitated medication label comprehension and problem solving with current medications. Patient identified pertinent information (i.e., how many pills, how many times/day) with only 1 error at supervision level. Error involved "take one pill once daily". Patient perceived this as taking the medication only one day per week vs. daily. No additional errors were made following verbal explanation to correct error and patient demonstrated good understanding of medication instructions. Patient also answered problem solving scenarios with 100% accuracy at sup A level. Overall she demonstrated  significant improvement with medication label comprehension and reasoning as compared to previous encounters. Patient required sup A verbal cues to implement speech intelligibility strategies to achieve 90% intelligibility at conversation level today. Patient was left in bed with alarm activated and immediate needs within reach at end of session. Continue per current plan of care.      Pain Pain Assessment Pain Scale: 0-10 Pain Score: 0-No pain  Therapy/Group: Individual Therapy  Patty Sermons 10/31/2020, 2:17 PM

## 2020-10-31 NOTE — Patient Care Conference (Signed)
Inpatient RehabilitationTeam Conference and Plan of Care Update Date: 10/31/2020   Time: 10:38 AM    Patient Name: Tracey Meyer      Medical Record Number: [id number]  Date of Birth: Aug 02, 1960 Sex: Female         Room/Bed: 4W06C/4W06C-01 Payor Info: Payor: MEDICAID POTENTIAL / Plan: MEDICAID POTENTIAL / Product Type: *No Product type* /    Admit Date/Time:  10/09/2020  1:37 PM  Primary Diagnosis:  Embolic stroke [facility])  Hospital Problems: Principal Problem:   Embolic stroke [facility])    Expected Discharge Date: Expected Discharge Date: 11/02/20  Team Members Present: Physician leading conference: Dr. Alysia Penna Social Worker Present: Erlene Quan, BSW Nurse Present: Dorien Chihuahua, RN PT Present: Page Spiro, PT OT Present: Clyda Greener, OT SLP Present: Sherren Kerns, SLP PPS Coordinator present : Gunnar Fusi, SLP     Current Status/Progress Goal Weekly Team Focus  Bowel/Bladder   Patient continent B/B LMB 8/9  Pt will remain continent of bowel and bladder  Assess q shift and PRN   Swallow/Nutrition/ Hydration             ADL's   Min assist for LB bathing with min to mod for dressing.  Min assist squat pivot and stand pivot transfers with mod assist for functional mobility to the shower and toilet with use of the RW.  Brunnstrum stage III-IV in the LUE with min to mod assist to incorporate as an active assist for bathing tasks and dressing tasks.  Slight increased flexor tone in the digits, and elbow.  downgraded to min assist overall  selfcare retraining, neuromuscular re-education, balance retraining, therapeutic exercise, pt/family education, DME education   Mobility   light min assist supine>sit, CGA sit>supine, CGA/min assist sit<>stands and stand pivot transfers with R UE support, min/mod assist gait up to 66f using RW, min assist 8 steps using R UE support on HR - continues to have impaired motor planning with L LE extensor thrust during stance  causing excessive trunk/hip flexion moment  goals downgraded to CGA/min assist with wheelchair goals added to promote increased independence with functional mobility  transfer training, L LE NMR, tone management, activity tolerance, dynamic gait training, pt education, dynamic standing balance, stair navigation, wheelchair mobility and management training, and D/C planning   Communication   SUp A for intelligibility  mod I  Continued implementation of speech intelligiblity strategies at conversation level, increasing awareness and self monitoring   Safety/Cognition/ Behavioral Observations  sup A for basic, min A for complex - will need assist to manage medications at discharge due to minimal improvements and safety concern  Mod I  Executive functions, planning, organization, alternating attention.   Pain   Denies pain  Pt will remain pain free  Assess q shift and PRN treat as needed   Skin   Skin intact  Pt's skin will remain intact  Assess q shift and PRN     Discharge Planning:  D/c home with S.O and sister   Team Discussion: Worsening renal status with poor po intake and mediation adjustment. Potassium supplement added and MD checking labs.  Patient on target to meet rehab goals: yes, currently supervision for upper body care and min assist for lower body bathing and mod - max assist for dressing. Needs assistance to cross left over right extremity for clothing management. Completes stand pivot transfers with min  assist. Able to ambulate 749 with a RW and able to navigate 8 steps with a handrail although with  a flexed posture and right lean. Activity difficult due to fluctuating tone, extensor tone and hyperextension of knee. Anticipate wheelchair level for home.  *See Care Plan and progress notes for long and short-term goals.   Revisions to Treatment Plan:  Downgraded goals to min assist - CGA Hand splint rubbing thumb; splint adjusted  Custom AFO brace Teaching Needs: Safety,  medications, secondary stroke risk management, etc  Current Barriers to Discharge: Home enviroment access/layout, Lack of/limited family support, Insurance for SNF coverage, Medication compliance, and and lack of insurance for coverage/DME  Possible Resolutions to Barriers: MA application paperwork being completed w family assist DME recommended Family education with significant other and sister     Medical Summary Current Status: Blood pressures overall improved although down today, reduced fluid intake, acute renal failure requiring medication adjustment  Barriers to Discharge: Medical stability;Nutrition means   Possible Resolutions to [employer] Focus: Requiring medication adjustment for acute renal failure as well as IV hydration, given CHF history need to monitor for fluid overload   Continued Need for Acute Rehabilitation Level of Care: The patient requires daily medical management by a physician with specialized training in physical medicine and rehabilitation for the following reasons: Direction of a multidisciplinary physical rehabilitation program to maximize functional independence : Yes Medical management of patient stability for increased activity during participation in an intensive rehabilitation regime.: Yes Analysis of laboratory values and/or radiology reports with any subsequent need for medication adjustment and/or medical intervention. : Yes   I attest that I was present, lead the team conference, and concur with the assessment and plan of the team.   Dorien Chihuahua B 10/31/2020, 12:51 PM

## 2020-10-31 NOTE — Progress Notes (Signed)
Patient ID: Tracey Meyer, female   DOB: March 05, 1961, 60 y.o.   MRN: [id number]   SW still unable to reach S.O to schedule family education. Pt sister still attempting to reach S.O. SW willl cont to follow up   Erlene Quan, [place]

## 2020-11-01 ENCOUNTER — Inpatient Hospital Stay (HOSPITAL_COMMUNITY): Payer: Medicaid Other

## 2020-11-01 LAB — URINALYSIS, ROUTINE W REFLEX MICROSCOPIC
Bilirubin Urine: NEGATIVE
Glucose, UA: NEGATIVE mg/dL
Ketones, ur: NEGATIVE mg/dL
Nitrite: NEGATIVE
Protein, ur: 30 mg/dL — AB
Specific Gravity, Urine: 1.004 — ABNORMAL LOW (ref 1.005–1.030)
WBC, UA: >50 WBC/hpf — ABNORMAL HIGH (ref 0–5)
pH: 5 (ref 5.0–8.0)

## 2020-11-01 LAB — GLUCOSE, CAPILLARY
Glucose-Capillary: 110 mg/dL — ABNORMAL HIGH (ref 70–99)
Glucose-Capillary: 155 mg/dL — ABNORMAL HIGH (ref 70–99)
Glucose-Capillary: 117 mg/dL — ABNORMAL HIGH (ref 70–99)
Glucose-Capillary: 156 mg/dL — ABNORMAL HIGH (ref 70–99)

## 2020-11-01 LAB — BASIC METABOLIC PANEL
Anion gap: 8 (ref 5–15)
BUN: 13 mg/dL (ref 6–20)
CO2: 21 mmol/L — ABNORMAL LOW (ref 22–32)
Calcium: 9.9 mg/dL (ref 8.9–10.3)
Chloride: 110 mmol/L (ref 98–111)
Creatinine, Ser: 2.21 mg/dL — ABNORMAL HIGH (ref 0.44–1.00)
GFR, Estimated: 25 mL/min — ABNORMAL LOW (ref >60)
Glucose, Bld: 117 mg/dL — ABNORMAL HIGH (ref 70–99)
Potassium: 3.7 mmol/L (ref 3.5–5.1)
Sodium: 139 mmol/L (ref 135–145)

## 2020-11-01 IMAGING — US US RENAL
1 series · 14 of 25 positions shown · non-contrast
Comparison: None.

CLINICAL DATA: Acute kidney injury

EXAM:
RENAL / URINARY TRACT ULTRASOUND COMPLETE

[Series 1: us renal · 14 of 41 slices shown]
[im 1/41]
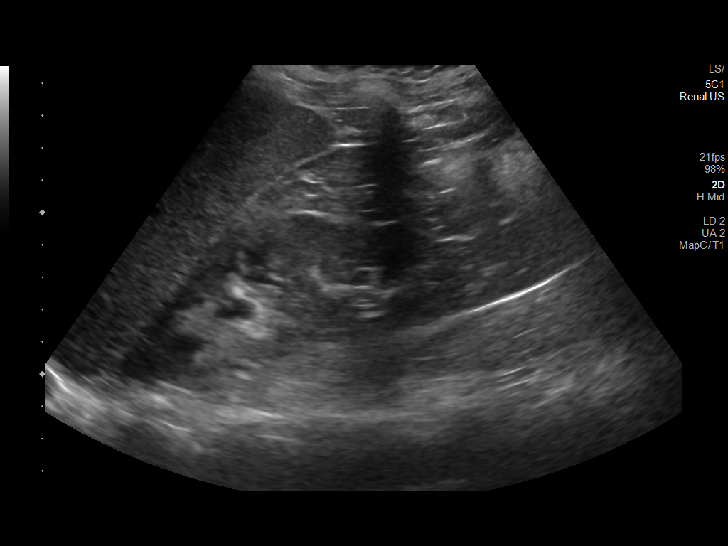
[im 4/41]
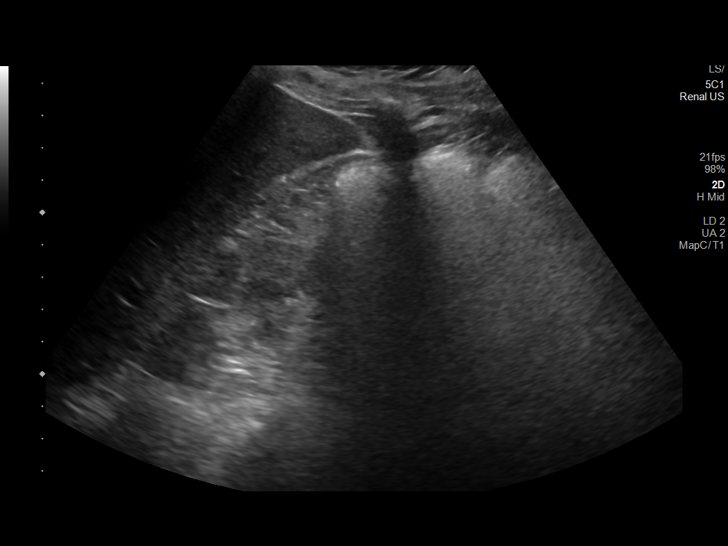
[im 7/41]
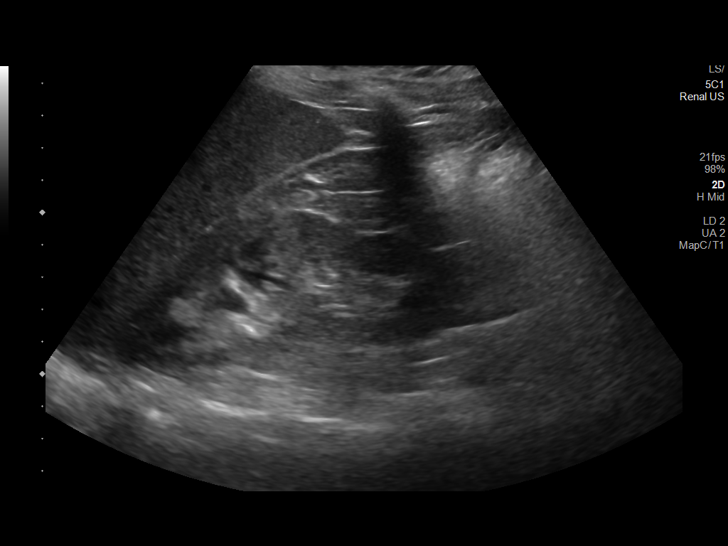
[im 11/41]
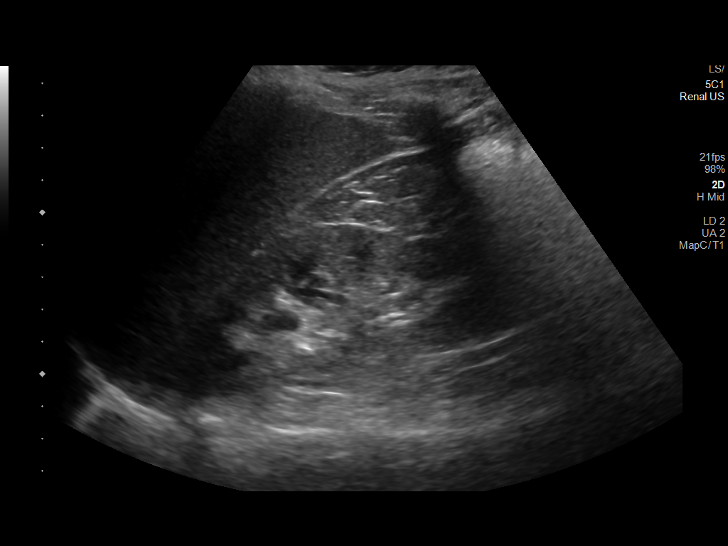
[im 14/41]
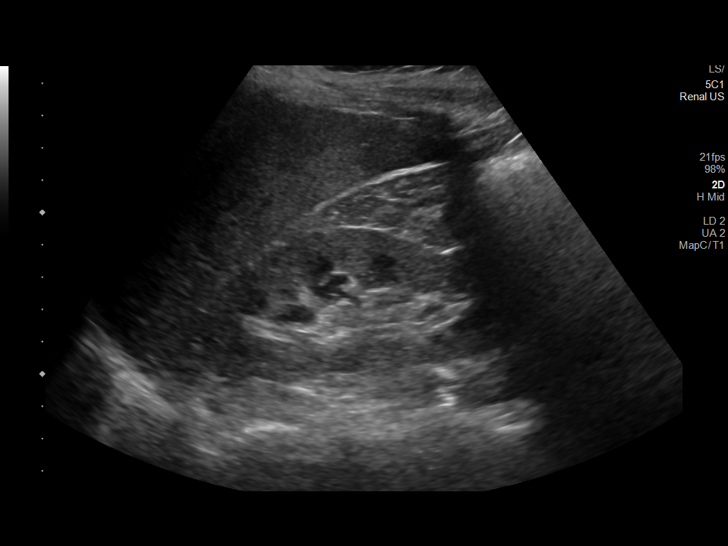
[im 16/41]
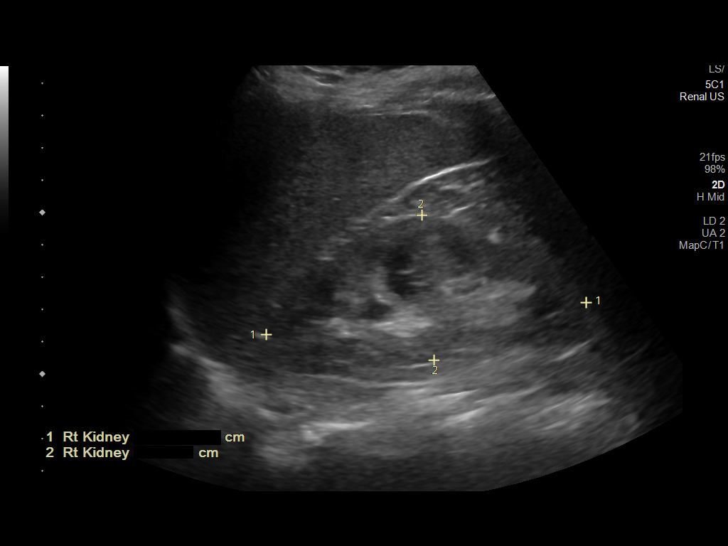
[im 19/41]
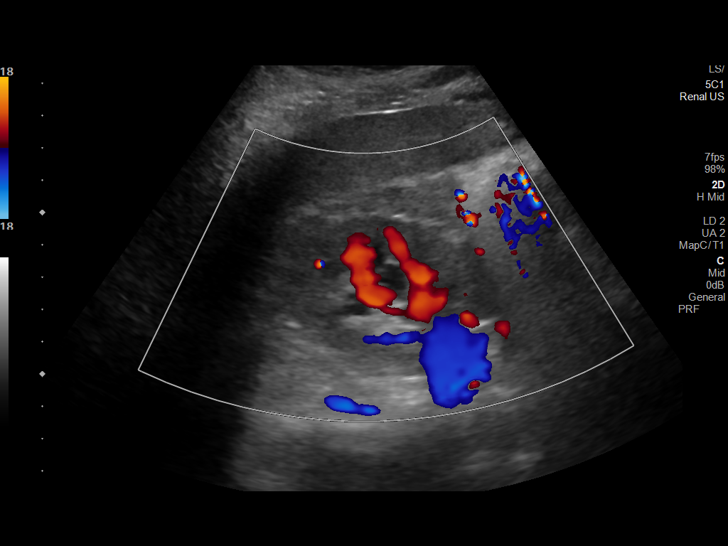
[im 22/41]
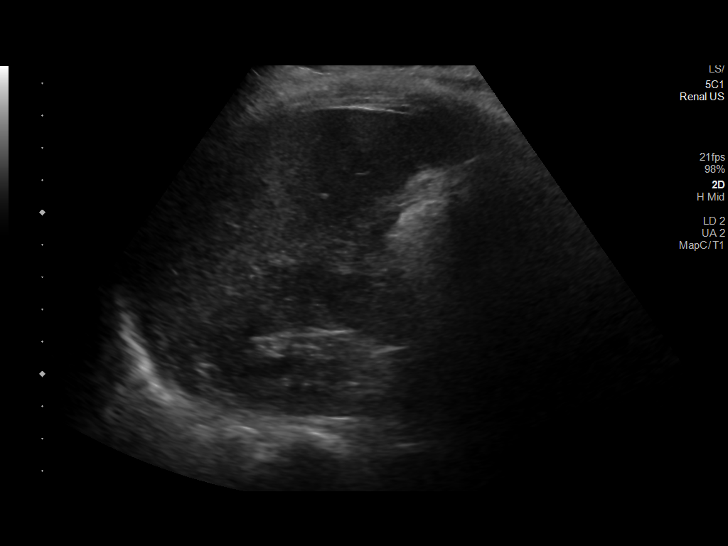
[im 26/41]
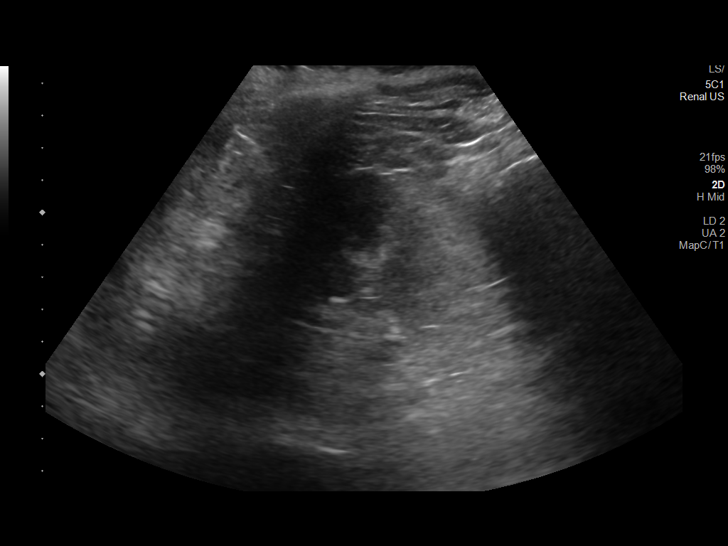
[im 27/41]
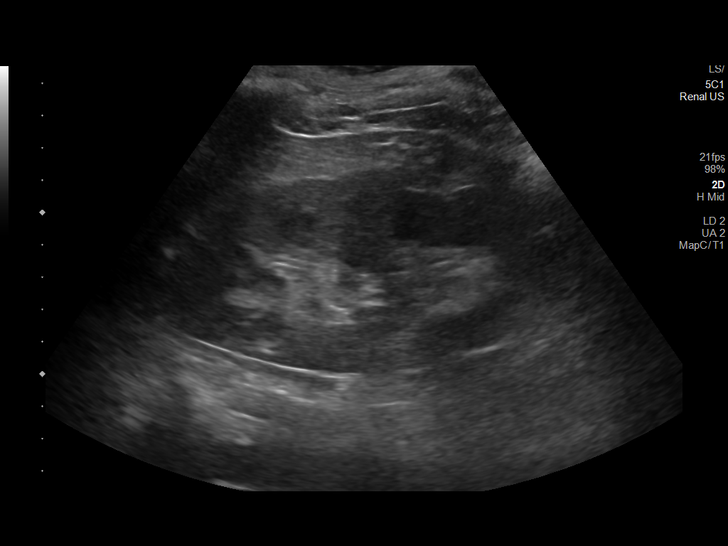
[im 31/41]
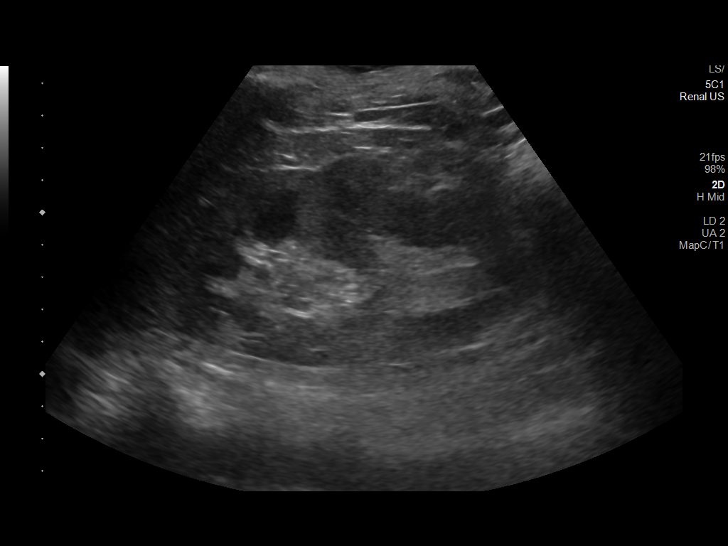
[im 34/41]
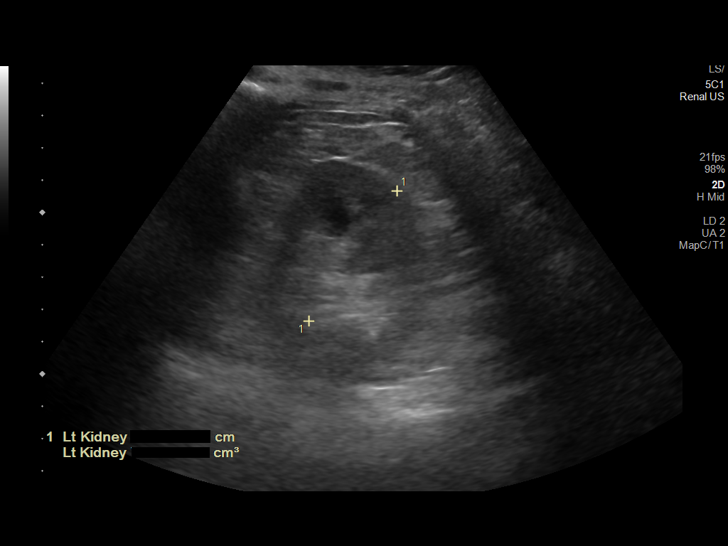
[im 37/41]
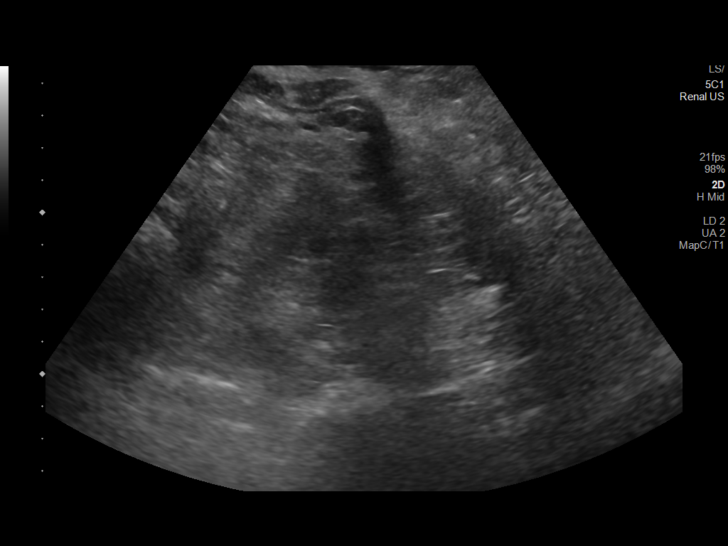
[im 41/41]
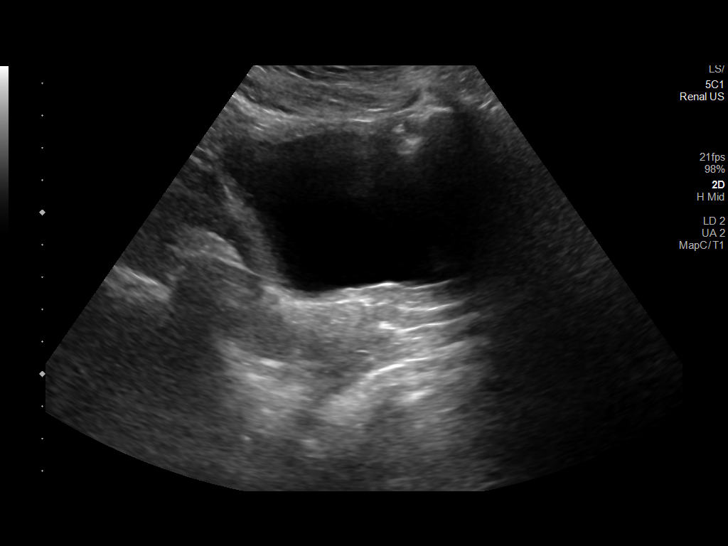

[14 of 25 positions shown; findings below may reference images not displayed]

FINDINGS: Right Kidney:

Renal measurements: 10 x 4.5 x 4.5 cm = volume: 106 mL. Echogenicity
is within normal limits. No concerning renal mass, shadowing
calculus or hydronephrosis.

Left Kidney:

Renal measurements: 11.2 x 6 x 4.9 cm = volume: 172 mL. Echogenicity
is within normal limits. No concerning renal mass, shadowing
calculus or hydronephrosis.

Bladder:

Appears normal for degree of bladder distention.

Other:

None.
IMPRESSION: Unremarkable renal ultrasound.

## 2020-11-01 NOTE — Progress Notes (Signed)
Physical Therapy Session Note  Patient Details  Name: Tracey Meyer MRN: [id number] Date of Birth: 05-21-1960  Today's Date: 11/01/2020 PT Individual Time: 0109-0205 PT Individual Time Calculation (min): 56 min   Short Term Goals: Week 3:  PT Short Term Goal 1 (Week 3): = to LTGs based on ELOS  Skilled Therapeutic Interventions/Progress Updates:    Pt received sitting in w/c and agreeable to therapy session though reports she is concerned about her kidneys due to most recent lab values. Per team update, pt will not be D/Cing home tomorrow as originally planned due to this medical concern. Therapist provided emotional support and encouragement. Pt's personal DME noted to be delivered to her room. R stand pivot to her w/c with CGA.  Transported to/from gym in w/c for time management and energy conservation. Lowered pt's wheelchair to hemi-height to allow improved floor-to-seat height in order for pt to reach ground better for R hemi-technique w/c propulsion (unable to receive ultra-hemi-height through [facility] DME per SW).  L LE NMR and dynamic standing balance task targeting L stance phase control to avoid L LE knee hyperextension while maintaining upright and tapping R LE on/off 4" step, no UE support, - mirror feedback and heavy min assist for balance with therapist blocking L knee hyperextension. Transitioned to attempting L LE foot taps on/off step targeting swing phase of gait with significant difficulty noted performing adequate hip/knee flexion.  Nephrology MD in/out to discuss pt's kidney function - educated on ensuring drinking to thirst and consuming potassium rich foods/beverages.  Chris, orthotist, present with pt's custom AFO. Donned total assist to ensure proper fit - discussed pt's need of larger shoe to allow increased independence with donning/doffing shoe while wearing brace. Gait training ~28f using RW wearing L LE custom AFO with pt demonstrating improved L knee positioning  (not having excessive knee extension thrust) which is resulting in decreased L hip flexion during L stance and improved, though still present, R lateral and anterior trunk lean during L swing phase - continued multimodal cuing for improvement.   Stair navigation training using R UE support on each HR via step-to pattern leading with R LE on ascent and L LE on descent with light min assist for balance - demos improved posture and balance during descent while wearing custom AFO.  Discussed with pt and Christ the need for a L shoe toe-cap to improve pt's independence with swing phase advancement -Gerald Stabsplanning to return pt's shoe and AFO tomorrow.  Performed R hemi-technique w/c propulsion ~375fwith supervision but pt having increased difficulty propelling this w/c due to weight and floor-to-seat height - will continue to address as no other w/c option based on insurance limitations. Transported back to room. L stand pivot from her w/c back to CIR w/c with CGA. Pt left seated with needs in reach and seat belt alarm on.  Therapy Documentation Precautions:  Precautions Precautions: Fall Precaution Comments: L hemiparesis Restrictions Weight Bearing Restrictions: No   Pain: No reports of pain throughout session.    Therapy/Group: Individual Therapy  CaTawana Scale PT, DPT, NCS, CSRS 11/01/2020, 12:38 PM

## 2020-11-01 NOTE — Progress Notes (Signed)
Occupational Therapy Session Note  Patient Details  Name: Tracey Meyer MRN: [id number] Date of Birth: [birth year]  Today's Date: 11/01/2020 OT Individual Time: [id number] OT Individual Time Calculation (min): 71 min    Short Term Goals: Week 3:  OT Short Term Goal 1 (Week 3): pt will complete UB dressing with CGA using hemi techniques PRN OT Short Term Goal 2 (Week 3): pt will complete 2/3 toileting tasks with MIN A OT Short Term Goal 3 (Week 3): pt will participate in simple meal prep task with good safety awareness and overall MOD A with LRAD  Skilled Therapeutic Interventions/Progress Updates:    Pt worked on grooming tasks and LB dressing to start.  Max assist for donning lace up shoes and tying them with supervision  for oral hygiene from wheelchair level.  She was able to use the LUE as a gross assist when opening the toothpaste and putting the top back on.  Transitioned to the therapy gym for work on LUE neuromuscular re-education.  Hand paddle was used on the left hand for weightbearing activities.  She was able to work on left weightshifts onto the arm in sitting with left lean and transition back to sitting for increased left elbow extension.  Also had her complete functional reaching task for bean bags to the left as well with the RUE while supporting herself in sitting to keep from falling to the left.  Transitioned to squat position while supporting herself with her LUE on a plinth while reaching for the bean bags as well with mod facilitation for midline orientation and left elbow extension.  Next, had her work on functional reach at knee to shoulder level with the LUE and mod assist to pick up the bean bags and place in container.  Finished session with placement of NMES to the dorsal left forearm for facilitation of digit extension at level 12 intensity.  Kept stimulation on for 45 mins without any adverse reactions or pain.  See below for parameters.  Finished session with mod  assist stand pivot transfer to the wheelchair and return to the room where pt remained up in the wheelchair with safety belt in place and LUE supported on pillows.   Saebo Stim One 330 pulse width 35 Hz pulse rate On 8 sec/ off 8 sec Ramp up/ down 2 sec Symmetrical Biphasic wave form  Max intensity 169m at 500 Ohm load   Therapy Documentation Precautions:  Precautions Precautions: Fall Precaution Comments: L hemiparesis Restrictions Weight Bearing Restrictions: No  Pain: Pain Assessment Pain Scale: Faces Faces Pain Scale: Hurts a little bit Pain Type: Acute pain Pain Location: Hand Pain Orientation: Right Pain Descriptors / Indicators: Discomfort Pain Onset: With Activity Pain Intervention(s): Repositioned;Emotional support ADL: See Care Tool Section for some details of mobility and selfcare   Therapy/Group: Individual Therapy  Aja Whitehair OTR/L  11/01/2020, 12:22 PM

## 2020-11-01 NOTE — Progress Notes (Signed)
Speech Language Pathology Daily Session Note  Patient Details  Name: Tracey Meyer MRN: [id number] Date of Birth: 08-26-1960  Today's Date: 11/01/2020 SLP Individual Time: [id number] SLP Individual Time Calculation (min): 40 min  Short Term Goals: Week 3: SLP Short Term Goal 1 (Week 3): Patient will utilize speech intelligibility strategies at the sentence level to maximize intelligibility and decrease shortness of breath with overall Sup A verbal cues. SLP Short Term Goal 2 (Week 3): Patient will demosntrate complex problem solving for functional tasks with supervision verbal cues. SLP Short Term Goal 3 (Week 3): Patient will self-monitor and correct erorrs during complex tasks with mod I. SLP Short Term Goal 4 (Week 3): Patient will utilize memory compensatory strategies to recall functional information with mod I  Skilled Therapeutic Interventions: Patient agreeable to skilled ST intervention with focus on cognitive goals. Patient was emotional as she discussed kidney concerns which will delay discharge. SLP played 60's classics music for patient during session which appeared to raise her spirits. Background noise did not interfere with attention to today's cognitive tasks involving deductive reasoning and complex problem solving in which she completed with mod I-to-sup A. Patient initiated use of compensatory memory strategies using notebook at independent level. SLP provided patient with handouts containing cognitive exercises and activities to complete between therapy per patient request. Patient was left in wheelchair with alarm activated and immediate needs within reach at end of session. SLP left patient with motivating Big Boggle game involving generating as many connected words as possible within a 5x5 grid. Continue per current plan of care.      Pain Pain Assessment Pain Scale: 0-10 Pain Score: 0-No pain Faces Pain Scale: Hurts a little bit Pain Type: Acute pain Pain Location:  Hand Pain Orientation: Right Pain Descriptors / Indicators: Discomfort Pain Onset: With Activity Pain Intervention(s): Repositioned;Emotional support  Therapy/Group: Individual Therapy  Patty Sermons 11/01/2020, 12:26 PM

## 2020-11-01 NOTE — Progress Notes (Signed)
PROGRESS NOTE   Subjective/Complaints: Creat going up despite IVF bolus yesterday   ROS: Patient denies CP, SOB, N/V/D Objective:   No results found. No results for input(s): WBC, HGB, HCT, PLT in the last 72 hours.    Recent Labs    10/31/20 1440 11/01/20 0620  NA 139 139  K 3.9 3.7  CL 111 110  CO2 20* 21*  GLUCOSE 121* 117*  BUN 12 13  CREATININE 1.89* 2.21*  CALCIUM 9.9 9.9       Intake/Output Summary (Last 24 hours) at 11/01/2020 0826 Last data filed at 11/01/2020 [id number] Gross per 24 hour  Intake 820 ml  Output --  Net 820 ml         Physical Exam: Vital Signs Blood pressure (!) 144/80, pulse 71, temperature 98.2 F (36.8 C), temperature source Oral, resp. rate 18, height '5\' 4"'$  (1.626 m), weight 71.4 kg, SpO2 100 %.  General: No acute distress Mood and affect are appropriate Heart: Regular rate and rhythm no rubs murmurs or extra sounds Lungs: Clear to auscultation, breathing unlabored, no rales or wheezes Abdomen: Positive bowel sounds, soft nontender to palpation, nondistended Extremities: No clubbing, cyanosis, or edema Skin: No evidence of breakdown, no evidence of rash  Neurologic: intact, motor strength is 5/5 in right deltoid, bicep, tricep, grip, hip flexor, knee extensors, ankle dorsiflexor and plantar flexor 3-/5 Left deltoid, 3 - biceps, 2 - triceps , 0/5 grip, 2- HF, KE, 0/5 ADF Sensory exam normal sensation to light touch and proprioception in bilateral upper and lower extremities  Musculoskeletal: reduced left shoulder abduction, neg impingement sign , no Left hand tenderness or swelling. Left heel cord tight  Assessment/Plan: 1. Functional deficits which require 3+ hours per day of interdisciplinary therapy in a comprehensive inpatient rehab setting. Physiatrist is providing close team supervision and 24 hour management of active medical problems listed below. Physiatrist and rehab  team continue to assess barriers to discharge/monitor patient progress toward functional and medical goals  Care Tool:  Bathing    Body parts bathed by patient: Right arm, Chest, Abdomen, Right upper leg, Left upper leg, Face, Left lower leg, Front perineal area, Buttocks, Right lower leg   Body parts bathed by helper: Left arm     Bathing assist Assist Level: Minimal Assistance - Patient > 75%     Upper Body Dressing/Undressing Upper body dressing   What is the patient wearing?: Pull over shirt    Upper body assist Assist Level: Supervision/Verbal cueing    Lower Body Dressing/Undressing Lower body dressing      What is the patient wearing?: Pants     Lower body assist Assist for lower body dressing: Minimal Assistance - Patient > 75%     Toileting Toileting    Toileting assist Assist for toileting: Minimal Assistance - Patient > 75% (no clothing to manage secondary to just coming from the shower)     Transfers Chair/bed transfer  Transfers assist     Chair/bed transfer assist level: Supervision/Verbal cueing Chair/bed transfer assistive device: Armrests, Bedrails   Locomotion Ambulation   Ambulation assist      Assist level: Minimal Assistance - Patient > 75% Assistive  device: Walker-rolling Max distance: 80f   Walk 10 feet activity   Assist  Walk 10 feet activity did not occur: Safety/medical concerns  Assist level: Minimal Assistance - Patient > 75% Assistive device: Walker-rolling   Walk 50 feet activity   Assist Walk 50 feet with 2 turns activity did not occur: Safety/medical concerns  Assist level: Minimal Assistance - Patient > 75% Assistive device: Walker-rolling    Walk 150 feet activity   Assist Walk 150 feet activity did not occur: Safety/medical concerns         Walk 10 feet on uneven surface  activity   Assist Walk 10 feet on uneven surfaces activity did not occur: Safety/medical concerns   Assist level: Minimal  Assistance - Patient > 75% Assistive device: WAeronautical engineerWill patient use wheelchair at discharge?: Yes Type of Wheelchair: Manual    Wheelchair assist level: Set up assist, Supervision/Verbal cueing Max wheelchair distance: 1543f   Wheelchair 50 feet with 2 turns activity    Assist        Assist Level: Supervision/Verbal cueing   Wheelchair 150 feet activity     Assist      Assist Level: Supervision/Verbal cueing   Blood pressure (!) 144/80, pulse 71, temperature 98.2 F (36.8 C), temperature source Oral, resp. rate 18, height '5\' 4"'$  (1.626 m), weight 71.4 kg, SpO2 100 %.  Medical Problem List and Plan: 1.  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  Impaired mobility, ambulating >200 feet, d/c lovenox             -antiplatelet therapy: ASA/Brillinta 3. Pain Management: Tylenol prn.  4. Mood: LCSW to follow for evaluation and support.              -antipsychotic agents: N/A 5. Neuropsych: This patient is capable of making decisions on her own behalf. 6. Skin/Wound Care: Routine pressure relief measures.  7. Fluids/Electrolytes/Nutrition: Monitor I/O.Heart healthy diet. 8.  NSTEMI s/p PCI/DES: On ASA/Brillinta 9. Chronic combined CHF: Monitor for signs of overload. Check weights daily No peripheral edema, monitor after fluid bolus and KCL riders              --Continue Entresto, toprol XL Spironolactone and Crestor.             --SBP goal 130-150 to prevent cerebral hypoperfusion. Vitals:   10/31/20 1924 11/01/20 0411  BP: 138/78 (!) 144/80  Pulse: 68 71  Resp: 18 18  Temp: 98.7 F (37.1 C) 98.2 F (36.8 C)  SpO2: 98% 100%  BP controlled 8/10 but below goal 10. T2DM: Hgb A1C-6.9. Monitor BS ac/hs. Educate on CM diet.Newly  diagnosed CBG (last 3)  Recent Labs    10/31/20 1719 10/31/20 2053 11/01/20 0621  GLUCAP 112* 105* 110*   Controlled 8/11  -- Continue metformin bid (recently added)  11. Anemia: Likely due to DAPT/Lovenox. Reported to have some pink vaginal discharge--vaginally v/s bladder source?             --monitor for signs of bleeding. Monitor serial H/H. 12. Constipation: BM yesterday. Continue Senna and warm prune juice. Does not want to try anything stronger for now.  Decrease Senna to 1 tab HS 13. Hypokalemia: supplement 63mq today, monitor K+ weekly.  14. Intermittent SOB: Ordered incentive spirometer to improve breathing strength and ability over time. Provided incentive spirometer education. CXR obtained and stable   15.  AKI likely pre renal exacerbated by valsartan - will ask nephro to consult given creat cont to rise   LOS: 23 days A FACE TO FACE EVALUATION WAS PERFORMED  ACharlett Blake8/01/2021, 8:26 AM

## 2020-11-01 NOTE — Consult Note (Signed)
[facility]  INPATIENT CONSULTATION  Reason for Consultation: AKI Requesting Provider: Dr. Letta Pate  HPI: Tracey Meyer is an 60 y.o. female with HTN, DM, HL, CAD s/p PCI 09/2020, CVAs 09/2020 who is seen by nephrology for evaluation and management of AKI.   Initially treated for NSTEMI with DES to occluded LAD 7/2 and discharged 7/4; course also notable for HTN 215/110.  Readmitted 7/7 with L weakness and slurred speech found to have multiple BL cerebral and cerebellar infarcts in imaging.  W/u concluded likely cardioembolic.  EF [id number].  She was transferred to CIR on 10/09/20.   She's making progress and plan was d/c 8/12 prior to AKI.   Cr was in the 0.8-1.2 range from 7/2 to 7/25.  On 8/1 it was 1.3, 8/10 1.9 > 8/10 1.9 > 1.9 > 8/11 2.2.  She was given a NS 511m bolus yesterday and rec'd 50/hr of 1/2 NS for about 4 hrs.  No iodinated contrast since 7/11.  Entresto 24/26 BID d/c'd after 8/9 PM dose. No fleets. No NSAIDs.  No UOP or weights have been recorded.  UA 7/8 neg for protein, looked like UTI, no culture done.   No renal imaging to date.  CBC has been normal.   BPs have mainly been in the 130-140 range, a few in the past 3 nights in the 110s/60s.   This was asymptomatic. She feels fairly well - says po intake is ok, drinking fluids no difficulty.  Did have med induced loose stools in the past few days after constipation.  No LUTs, no hematuria or dysuria.  Reports normal volume of urine without straining to void.    PMH: Past Medical History:  Diagnosis Date   Cardiomyopathy, ischemic 09/24/2020   DM (diabetes mellitus), type 2 ([place] 09/24/2020   HTN (hypertension)    Hyperlipidemia    Prediabetes    S/P angioplasty with stent to mLAD 09/22/20  09/24/2020   Tobacco abuse 09/24/2020   Transaminitis 09/24/2020   PSH: Past Surgical History:  Procedure Laterality Date   BUBBLE STUDY  10/02/2020   Procedure: BUBBLE STUDY;  Surgeon: CLelon Perla MD;   [facility];  Service: Cardiovascular;;   CORONARY/GRAFT ACUTE MI REVASCULARIZATION N/A 09/22/2020   Procedure: Coronary/Graft Acute MI Revascularization;  Surgeon: JMartinique Peter M, MD;  [facility];  Service: Cardiovascular;  Laterality: N/A;   LEFT HEART CATH AND CORONARY ANGIOGRAPHY N/A 09/22/2020   Procedure: LEFT HEART CATH AND CORONARY ANGIOGRAPHY;  Surgeon: JMartinique Peter M, MD;  [facility];  Service: Cardiovascular;  Laterality: N/A;   mastectomy Left    at age [age]due to concerns of cancer   TEE WITHOUT CARDIOVERSION N/A 10/02/2020   Procedure: TRANSESOPHAGEAL ECHOCARDIOGRAM (TEE);  Surgeon: CLelon Perla MD;  [facility];  Service: Cardiovascular;  Laterality: N/A;   Past Medical History:  Diagnosis Date   Cardiomyopathy, ischemic 09/24/2020   DM (diabetes mellitus), type 2 ([place] 09/24/2020   HTN (hypertension)    Hyperlipidemia    Prediabetes    S/P angioplasty with stent to mLAD 09/22/20  09/24/2020   Tobacco abuse 09/24/2020   Transaminitis 09/24/2020    Medications:  I have reviewed the patient's current medications.   Medications Prior to Admission  Medication Sig Dispense Refill   acetaminophen (TYLENOL) 325 MG tablet Take 2 tablets (650 mg total) by mouth every 4 (four) hours as needed for headache or mild pain.     aspirin EC  81 MG EC tablet Take 1 tablet (81 mg total) by mouth daily. Swallow whole. 30 tablet 11   calcium carbonate (TUMS - DOSED IN MG ELEMENTAL CALCIUM) 500 MG chewable tablet Chew 2 tablets by mouth daily as needed for indigestion or heartburn.     Glycerin-Hypromellose-PEG 400 (VISINE DRY EYE OP) Place 1 drop into both eyes daily as needed (dry eyes).     metFORMIN (GLUCOPHAGE) 500 MG tablet Take 1 tablet (500 mg total) by mouth 2 (two) times daily with a meal. 60 tablet 6   metoprolol succinate (TOPROL-XL) 25 MG 24 hr tablet Take 1 tablet (25 mg total) by mouth daily. 30 tablet 0    nitroGLYCERIN (NITROSTAT) 0.4 MG SL tablet Place 1 tablet (0.4 mg total) under the tongue every 5 (five) minutes x 3 doses as needed for chest pain. 24 tablet 4   rosuvastatin (CRESTOR) 40 MG tablet Take 1 tablet (40 mg total) by mouth daily. 30 tablet 0   sacubitril-valsartan (ENTRESTO) 24-26 MG Take 1 tablet by mouth 2 (two) times daily. 60 tablet 6   ticagrelor (BRILINTA) 90 MG TABS tablet Take 1 tablet (90 mg total) by mouth 2 (two) times daily. 60 tablet 11    ALLERGIES:  No Known Allergies  FAM HX: Family History  Problem Relation Age of Onset   Cancer Mother    Lupus Sister    Heart disease Brother    Cancer Maternal Aunt     Social History:   reports that she has been smoking cigarettes. She has been smoking an average of .5 packs per day. She has never used smokeless tobacco. She reports that she does not use drugs. No history on file for alcohol use.  ROS: 12 system ROS neg except per HPI above  Blood pressure (!) 144/80, pulse 71, temperature 98.2 F (36.8 C), temperature source Oral, resp. rate 18, height '5\' 4"'$  (1.626 m), weight 71.4 kg, SpO2 100 %. PHYSICAL EXAM: Gen: working with PT in gym, standing at first, sits independently  Eyes: anicteric, not injected ENT: MMM Neck: supple, no JVD CV: RRR, no rub Abd: soft, nontender Lungs: clear lungs Back: no CVA TTP GU: no foley Extr:  no edema Neuro: conversant, speaks softly; LUE hemiparesis noted Skin: warm and dry   Results for orders placed or performed during the hospital encounter of 10/09/20 (from the past 48 hour(s))  Glucose, capillary     Status: Abnormal   Collection Time: 10/30/20  5:10 PM  Result Value Ref Range   Glucose-Capillary 102 (H) 70 - 99 mg/dL    Comment: Glucose reference range applies only to samples taken after fasting for at least 8 hours.  Glucose, capillary     Status: Abnormal   Collection Time: 10/30/20  9:06 PM  Result Value Ref Range   Glucose-Capillary 164 (H) 70 - 99 mg/dL     Comment: Glucose reference range applies only to samples taken after fasting for at least 8 hours.  Basic metabolic panel     Status: Abnormal   Collection Time: 10/31/20 12:46 AM  Result Value Ref Range   Sodium 139 135 - 145 mmol/L   Potassium 3.4 (L) 3.5 - 5.1 mmol/L   Chloride 109 98 - 111 mmol/L   CO2 20 (L) 22 - 32 mmol/L   Glucose, Bld 141 (H) 70 - 99 mg/dL    Comment: Glucose reference range applies only to samples taken after fasting for at least 8 hours.   BUN 13 6 -  20 mg/dL   Creatinine, Ser 1.90 (H) 0.44 - 1.00 mg/dL   Calcium 9.4 8.9 - 10.3 mg/dL   GFR, Estimated 30 (L) >60 mL/min    Comment: (NOTE) Calculated using the CKD-EPI Creatinine Equation (2021)    Anion gap 10 5 - 15    Comment: Performed at [facility] [address], [place], [place] [address]  Glucose, capillary     Status: Abnormal   Collection Time: 10/31/20  6:02 AM  Result Value Ref Range   Glucose-Capillary 122 (H) 70 - 99 mg/dL    Comment: Glucose reference range applies only to samples taken after fasting for at least 8 hours.  Glucose, capillary     Status: Abnormal   Collection Time: 10/31/20 12:04 PM  Result Value Ref Range   Glucose-Capillary 128 (H) 70 - 99 mg/dL    Comment: Glucose reference range applies only to samples taken after fasting for at least 8 hours.   Comment 1 Notify RN   Basic metabolic panel     Status: Abnormal   Collection Time: 10/31/20  2:40 PM  Result Value Ref Range   Sodium 139 135 - 145 mmol/L   Potassium 3.9 3.5 - 5.1 mmol/L   Chloride 111 98 - 111 mmol/L   CO2 20 (L) 22 - 32 mmol/L   Glucose, Bld 121 (H) 70 - 99 mg/dL    Comment: Glucose reference range applies only to samples taken after fasting for at least 8 hours.   BUN 12 6 - 20 mg/dL   Creatinine, Ser 1.89 (H) 0.44 - 1.00 mg/dL   Calcium 9.9 8.9 - 10.3 mg/dL   GFR, Estimated 30 (L) >60 mL/min    Comment: (NOTE) Calculated using the CKD-EPI Creatinine Equation (2021)    Anion gap 8 5 - 15     Comment: Performed at [facility] [address] [place], [place] [address]  Glucose, capillary     Status: Abnormal   Collection Time: 10/31/20  5:19 PM  Result Value Ref Range   Glucose-Capillary 112 (H) 70 - 99 mg/dL    Comment: Glucose reference range applies only to samples taken after fasting for at least 8 hours.   Comment 1 Notify RN   Glucose, capillary     Status: Abnormal   Collection Time: 10/31/20  8:53 PM  Result Value Ref Range   Glucose-Capillary 105 (H) 70 - 99 mg/dL    Comment: Glucose reference range applies only to samples taken after fasting for at least 8 hours.   Comment 1 Notify RN   Basic metabolic panel     Status: Abnormal   Collection Time: 11/01/20  6:20 AM  Result Value Ref Range   Sodium 139 135 - 145 mmol/L   Potassium 3.7 3.5 - 5.1 mmol/L   Chloride 110 98 - 111 mmol/L   CO2 21 (L) 22 - 32 mmol/L   Glucose, Bld 117 (H) 70 - 99 mg/dL    Comment: Glucose reference range applies only to samples taken after fasting for at least 8 hours.   BUN 13 6 - 20 mg/dL   Creatinine, Ser 2.21 (H) 0.44 - 1.00 mg/dL   Calcium 9.9 8.9 - 10.3 mg/dL   GFR, Estimated 25 (L) >60 mL/min    Comment: (NOTE) Calculated using the CKD-EPI Creatinine Equation (2021)    Anion gap 8 5 - 15    Comment: Performed at [facility] [address], [place], [place]  [id number]  Glucose, capillary     Status: Abnormal   Collection Time: 11/01/20  6:21 AM  Result Value Ref Range   Glucose-Capillary 110 (H) 70 - 99 mg/dL    Comment: Glucose reference range applies only to samples taken after fasting for at least 8 hours.   Comment 1 Notify RN   Glucose, capillary     Status: Abnormal   Collection Time: 11/01/20 11:34 AM  Result Value Ref Range   Glucose-Capillary 155 (H) 70 - 99 mg/dL    Comment: Glucose reference range applies only to samples taken after fasting for at least 8 hours.    No results found.  Assessment/Plan  **AKI:  Normal renal function until 3  days ago.  Suspect this was mild volume depletion + mild hypotension + ARB.  Tracey Meyer has been D/C'd - would keep off for now.  She appears euvolemic and can drink to thirst.  Check Renal US and UA for completeness.  I think this should improve. Follow I/Os and daily weights.    **HTN:  neurology rec goal BP 130-150 due to significant posterior circulation vascular stenosis.  Had some modest hypotension the past 2 nights and entresto is off now.    **CAD s/p recent PCI: on DAPT, statin.  **CVA with L hemiparesis:  on CIR now for therapy - plans for discharge fairly soon.   Dispo:  was supposed to d/c tomorrow - I still think that could be possible if creatinine stable/improving tomorrow, nonoliguric and renal US ok.    Would hold if worsening.  We can f/u with her outpt if needed.   Justin Mend 11/01/2020, 1:13 PM

## 2020-11-01 NOTE — Discharge Instructions (Addendum)
Inpatient Rehab Discharge Instructions  JAZLINE GANTT Discharge date and time: No discharge date for patient encounter.   Activities/Precautions/ Functional Status: Activity: activity as tolerated Diet: diabetic diet  Wound Care: Routine skin checks Functional status:  ___ No restrictions     ___ Walk up steps independently ___ 24/7 supervision/assistance   ___ Walk up steps with assistance ___ Intermittent supervision/assistance  ___ Bathe/dress independently ___ Walk with walker     __x_ Bathe/dress with assistance ___ Walk Independently    ___ Shower independently ___ Walk with assistance    ___ Shower with assistance ___ No alcohol     ___ Return to work/school ________  COMMUNITY REFERRALS UPON DISCHARGE:   Home Exercise program for home due to charity  Medical Equipment/Items Ordered: Oncologist, Conservation officer, nature, Engineer, civil (consulting), Producer, television/film/video                                                  Agency/Supplier: Artist   Special Instructions:  No driving smoking or alcoholSTROKE/TIA DISCHARGE INSTRUCTIONS SMOKING Cigarette smoking nearly doubles your risk of having a stroke & is the single most alterable risk factor  If you smoke or have smoked in the last 12 months, you are advised to quit smoking for your health. Most of the excess cardiovascular risk related to smoking disappears within a year of stopping. Ask you doctor about anti-smoking medications [place] Quit Line: 1-800-QUIT NOW Free Smoking Cessation Classes [phone]  CHOLESTEROL Know your levels; limit fat & cholesterol in your diet  Lipid Panel     Component Value Date/Time   CHOL 186 09/28/2020 0404   TRIG 100 09/28/2020 0404   HDL 36 (L) 09/28/2020 0404   CHOLHDL 5.2 09/28/2020 0404   VLDL 20 09/28/2020 0404   LDLCALC 130 (H) 09/28/2020 0404     Many patients benefit from treatment even if their cholesterol is at goal. Goal: Total Cholesterol (CHOL) less than 160 Goal:   Triglycerides (TRIG) less than 150 Goal:  HDL greater than 40 Goal:  LDL (LDLCALC) less than 100   BLOOD PRESSURE American Stroke Association blood pressure target is less that 120/80 mm/Hg  Your discharge blood pressure is:  BP: (!) 144/80 Monitor your blood pressure Limit your salt and alcohol intake Many individuals will require more than one medication for high blood pressure  DIABETES (A1c is a blood sugar average for last 3 months) Goal HGBA1c is under 7% (HBGA1c is blood sugar average for last 3 months)  Diabetes:   Lab Results  Component Value Date   HGBA1C 6.8 (H) 09/28/2020    Your HGBA1c can be lowered with medications, healthy diet, and exercise. Check your blood sugar as directed by your physician Call your physician if you experience unexplained or low blood sugars.  PHYSICAL ACTIVITY/REHABILITATION Goal is 30 minutes at least 4 days per week  Activity: Increase activity slowly, Therapies: Physical Therapy: Home Health Return to work:  Activity decreases your risk of heart attack and stroke and makes your heart stronger.  It helps control your weight and blood pressure; helps you relax and can improve your mood. Participate in a regular exercise program. Talk with your doctor about the best form of exercise for you (dancing, walking, swimming, cycling).  DIET/WEIGHT Goal is to maintain a healthy weight  Your discharge diet is:  Diet  Order             Diet heart healthy/carb modified Room service appropriate? Yes; Fluid consistency: Thin  Diet effective now                   liquids Your height is:  Height: '5\' 4"'$  (162.6 cm) Your current weight is: Weight: 71.4 kg Your Body Mass Index (BMI) is:  BMI (Calculated): 27.01 Following the type of diet specifically designed for you will help prevent another stroke. Your goal weight range is:   Your goal Body Mass Index (BMI) is 19-24. Healthy food habits can help reduce 3 risk factors for stroke:  High cholesterol,  hypertension, and excess weight.  RESOURCES Stroke/Support Group:  Call [phone]   STROKE EDUCATION PROVIDED/REVIEWED AND GIVEN TO PATIENT Stroke warning signs and symptoms How to activate emergency medical system (call 911). Medications prescribed at discharge. Need for follow-up after discharge. Personal risk factors for stroke. Pneumonia vaccine given: No Flu vaccine given: No My questions have been answered, the writing is legible, and I understand these instructions.  I will adhere to these goals & educational materials that have been provided to me after my discharge from the hospital.      My questions have been answered and I understand these instructions. I will adhere to these goals and the provided educational materials after my discharge from the hospital.  Patient/Caregiver Signature _______________________________ Date __________  Clinician Signature _______________________________________ Date __________  Please bring this form and your medication list with you to all your follow-up doctor's appointments.

## 2020-11-01 NOTE — Progress Notes (Signed)
Patient ID: Tracey Meyer, female   DOB: 1961/03/22, 60 y.o.   MRN: [id number]  This SW covering for primary [place].  SW received updates from attending reporting delay in discharge due to kidney concerns and pt will need to be seen by nephrology.  SW left message for s/o [name] [phone]) with  updates and encouraged follow-up.   SW left message for pt sister Shauna Hugh [phone]) to provide updates and encouraged follow-up.  SW spoke with pt sister [name] [phone]) who reported that she lives in [place] but appreciative of the call. Stated that she was also concerned about pt respiratory status and appeared trouble with breathing. Told her I would relay her concerns to medical team.  *SW received return phone call from pt sister [name] to discuss above. She reports she was concerned about pt being able to go to SNF because the home was not ready. SW informed based on notes left by Margreta Journey it indicated she was not eligible, which is likely to pt being uninsured. She was also asking about family education. She understands that Margreta Journey will be in tomorrow and they can discuss further.   Loralee Pacas, MSW, [place] Office: [phone] Cell: [phone] Fax: [phone]

## 2020-11-02 LAB — RENAL FUNCTION PANEL
Albumin: 3.2 g/dL — ABNORMAL LOW (ref 3.5–5.0)
Anion gap: 7 (ref 5–15)
BUN: 13 mg/dL (ref 6–20)
CO2: 22 mmol/L (ref 22–32)
Calcium: 9.9 mg/dL (ref 8.9–10.3)
Chloride: 111 mmol/L (ref 98–111)
Creatinine, Ser: 2.2 mg/dL — ABNORMAL HIGH (ref 0.44–1.00)
GFR, Estimated: 25 mL/min — ABNORMAL LOW (ref >60)
Glucose, Bld: 137 mg/dL — ABNORMAL HIGH (ref 70–99)
Phosphorus: 4 mg/dL (ref 2.5–4.6)
Potassium: 3.5 mmol/L (ref 3.5–5.1)
Sodium: 140 mmol/L (ref 135–145)

## 2020-11-02 LAB — GLUCOSE, CAPILLARY
Glucose-Capillary: 132 mg/dL — ABNORMAL HIGH (ref 70–99)
Glucose-Capillary: 147 mg/dL — ABNORMAL HIGH (ref 70–99)
Glucose-Capillary: 120 mg/dL — ABNORMAL HIGH (ref 70–99)
Glucose-Capillary: 194 mg/dL — ABNORMAL HIGH (ref 70–99)

## 2020-11-02 MED ORDER — SODIUM CHLORIDE 0.9 % IV SOLN
1.0000 g | INTRAVENOUS | Status: AC
  Administered 2020-11-02 – 2020-11-04 (×3): 1 g via INTRAVENOUS
  Filled 2020-11-02 (×3): qty 10

## 2020-11-02 NOTE — Progress Notes (Signed)
Patient ID: Tracey Meyer, female   DOB: 13-Sep-1960, 60 y.o.   MRN: [id number]  SW left pt sister Lynder Parents [phone]) for return call

## 2020-11-02 NOTE — Progress Notes (Signed)
Patient ID: Tracey Meyer, female   DOB: [birth year], 60 y.o.   MRN: [id number]  Sw made last attempt to call all family members, no answer left VM.   Sw informed patient of potential of d/c home on Monday and the plan for transport. Patient in agreement.   [place], [place]

## 2020-11-02 NOTE — Progress Notes (Signed)
Inpatient Rehabilitation Care Coordinator Discharge Note  The overall goal for the admission was met for:   Discharge location: Yes, home  Length of Stay: Yes, 27 Days  Discharge activity level: Yes, Min A  Home/community participation: Yes  Services provided included: MD, RD, PT, OT, SLP, RN, CM, TR, Pharmacy, Neuropsych, and SW  Financial Services: Other: uninsured  Choices offered to/list presented to: n/a  Follow-up services arranged: Other: HEP  Comments (or additional information): Wheelchair, [employer], [employer], Producer, television/film/video PTAR transfer due to family not attending family education . Match for medication assistance  Patient/Family verbalized understanding of follow-up arrangements: Yes  Individual responsible for coordination of the follow-up plan: [name] [phone],   Confirmed correct DME delivered: [name] 11/02/2020    [name]

## 2020-11-02 NOTE — Progress Notes (Signed)
Speech Language Pathology Discharge Summary  Patient Details  Name: Tracey Meyer MRN: [id number] Date of Birth: 09/17/1960  Patient has met 4 of 4 long term goals.  Patient to discharge at overall Modified Independent;Supervision level.   Reasons goals not met: NA   Clinical Impression/Discharge Summary: Patient has made excellent gains and has met 4 of 4 long-term goals this admission due to improved memory, problem solving, emergent awareness, and speech intelligibility. Patient is currently an overall mod I-to-sup A for cognitive tasks and requires occasional reminders for utilization of speech intelligibility strategies. Patient is currently tolerating regular diet and thin liquids and is independent for implementation of safe swallow precautions and strategies including slow rate, small bites/sips, upright positioning. Family education has not yet been completed due to transportation issues. Patient will discharge home with support from family and spouse. Continued SLP services do not appear indicated at this time.   Care Partner:  Caregiver Able to Provide Assistance:  (Patient to return home to husband although unclear with level of support to be provided)  Type of Caregiver Assistance:  (unknown)  Recommendation:     Rationale for SLP Follow Up: Other (comment) (NA)   Equipment: NA   Reasons for discharge: Treatment goals met   Patient/Family Agrees with Progress Made and Goals Achieved: Yes    Jamel Dunton T Laveta Gilkey 11/02/2020, 5:35 PM

## 2020-11-02 NOTE — Progress Notes (Addendum)
Pharmacy consulted for recommendation on abx safe with AKI  NKDA  WBC normal, afebrile SCr 2.2 (baseline 0.8-1.2), CrCl ~23 ml/min No hx resistant organisms  8/11 UA - large leukocytes, neg nitrite, many bacteria 8/12 UCx: pending  Recommendation: - If no symptoms, could consider this asymptomatic bacteriuria in which no treatment would be required - IV option would be ceftriaxone 1 g IV daily - PO option would be Keflex 250 mg PO bid and increase to 500 mg PO bid when CrCl >30 ml/min - Recommend 5 days treatment - Pharmacy signing off - please re-consult if needed  Thank you for involving pharmacy in this patient's care.  Renold Genta, PharmD, BCPS Clinical Pharmacist Clinical phone for 11/02/2020 until 3p is [phone] 11/02/2020 2:06 PM  **Pharmacist phone directory can be found on [email or web address] listed under [place]**

## 2020-11-02 NOTE — Progress Notes (Addendum)
Patient ID: Tracey Meyer, female   DOB: 16-Jul-1960, 60 y.o.   MRN: [id number]  SW received notification from [place] at [facility]. Currently OOS of hemi WC, will swap at patients home once available.  Sw unable to reach patient in reference to family education, picking up DME. Pt will potentially require PTAR transport due to being unable to reach family about transportation. Patient has agreed if her family does not respond by today she would like to be transported home by ambulance on Monday. In preparation, SW will confirm d/c address with patient, set up packet and SW will arrange patient transport on Monday between 9-11AM.  If we hear from family today, sw will attempt to have them come in for family education on tomorrow 8/13.  SW confirmed pt d/c address of [address] [place], [place] [address]. Pt reports family cannot confirm if they are able to transport patient until Sunday night. SW informed patient that we will arrange transportation and have DME delivered to home due to that potentially being too late of a notice.  [place], [place]

## 2020-11-02 NOTE — Progress Notes (Signed)
Speech Language Pathology Daily Session Note  Patient Details  Name: Tracey Meyer MRN: [id number] Date of Birth: 10/27/1960  Today's Date: 11/02/2020 SLP Individual Time: 1030-1100 SLP Individual Time Calculation (min): 30 min  Short Term Goals: Week 3: SLP Short Term Goal 1 (Week 3): Patient will utilize speech intelligibility strategies at the sentence level to maximize intelligibility and decrease shortness of breath with overall Sup A verbal cues. SLP Short Term Goal 2 (Week 3): Patient will demosntrate complex problem solving for functional tasks with supervision verbal cues. SLP Short Term Goal 3 (Week 3): Patient will self-monitor and correct erorrs during complex tasks with mod I. SLP Short Term Goal 4 (Week 3): Patient will utilize memory compensatory strategies to recall functional information with mod I  Skilled Therapeutic Interventions: Patient agreeable to skilled ST intervention with focus on cognitive goals. Patient demonstrated comprehension of medication labels with 100% accuracy at independent level. She completed verbal problem-solving and safety scenarios with appropriate judgement and reasoning with good safety awareness and caution. Patient was left in chair with alarm activated and immediate needs within reach at end of session. Continue per current plan of care.      Pain Pain Assessment Pain Scale: 0-10 Pain Score: 0-No pain  Therapy/Group: Individual Therapy  Tracey Meyer T Tracey Meyer 11/02/2020, 11:00 AM

## 2020-11-02 NOTE — Progress Notes (Signed)
Physical Therapy Session Note  Patient Details  Name: Tracey Meyer MRN: [id number] Date of Birth: 06/01/1960  Today's Date: 11/02/2020 PT Individual Time: 0810-0855 PT Individual Time Calculation (min): 45 min   Short Term Goals: Week 2:  PT Short Term Goal 1 (Week 2): Pt will perform supine<>sit with CGA PT Short Term Goal 1 - Progress (Week 2): Met PT Short Term Goal 2 (Week 2): Pt will perform sit<>stands using LRAD with CGA PT Short Term Goal 2 - Progress (Week 2): Met PT Short Term Goal 3 (Week 2): Pt will perform bed<>chair transfers using LRAD with min assist consistently PT Short Term Goal 3 - Progress (Week 2): Met PT Short Term Goal 4 (Week 2): Pt will ambulate at least 50ft using LRAD with no more than mod assist of 1 PT Short Term Goal 4 - Progress (Week 2): Met PT Short Term Goal 5 (Week 2): Pt will initiate stair navigation training PT Short Term Goal 5 - Progress (Week 2): Met Week 3:  PT Short Term Goal 1 (Week 3): = to LTGs based on ELOS   Skilled Therapeutic Interventions/Progress Updates:  Patient seated upright in w/c on entrance to room. Patient alert and agreeable to PT session. Patient denied pain during session. New AFO in pt's L shoe but difficulty donning with AFO in shoe, so demonstrated to pt that with this kind of AFO, it can be donned prior to donning shoe. Demonstrated with Max/ Total A.   Therapeutic Activity: Transfers: Patient performed STS and SPVT transfers throughout session with CGA d/t pt related fatigue this morning. Provided verbal cues for reaching back to w/c prior to descent to sit.  Gait Training:  Patient ambulated 16" x1 then completed one bout of steps and then ambulated 26' x1 with no rest break. Completed ambulation with CGA requiring tc/ vc for trunk extension and rotation to L, improved lean to L side and hip extension with R pelvic lateral rotation with L heel strike. Continued ambulation reaching 45' x1 with continued cueing as  above. Pt relates that she will have to "get used' to her hew AFO.   Wheelchair Mobility:  Patient propelled wheelchair 75 feet with distant supervision. Provided vc intermittently for increased push/ pull with LE in order to improve maneuvering obstacles in hallway.  Patient seated upright  in w/c at end of session with brakes locked, belt alarm set, and all needs within reach.     Therapy Documentation Precautions:  Precautions Precautions: Fall Precaution Comments: L hemiparesis Restrictions Weight Bearing Restrictions: No  Therapy/Group: Individual Therapy  Julie A Kraus PT, DPT 11/02/2020, 5:24 PM  

## 2020-11-02 NOTE — Progress Notes (Signed)
PROGRESS NOTE   Subjective/Complaints: No complaints this morning Discussed her lab results with her Creatinine increase has stabilized. Recommended she drink 6-8 glasses of water per day  ROS: Patient denies CP, SOB, N/V/D Objective:   US RENAL  Result Date: 11/01/2020 CLINICAL DATA:  Acute kidney injury EXAM: RENAL / URINARY TRACT ULTRASOUND COMPLETE COMPARISON:  None. FINDINGS: Right Kidney: Renal measurements: 10 x 4.5 x 4.5 cm = volume: 106 mL. Echogenicity is within normal limits. No concerning renal mass, shadowing calculus or hydronephrosis. Left Kidney: Renal measurements: 11.2 x 6 x 4.9 cm = volume: 172 mL. Echogenicity is within normal limits. No concerning renal mass, shadowing calculus or hydronephrosis. Bladder: Appears normal for degree of bladder distention. Other: None. IMPRESSION: Unremarkable renal ultrasound. Electronically Signed   By: Lovena Le M.D.   On: 11/01/2020 21:43   No results for input(s): WBC, HGB, HCT, PLT in the last 72 hours.    Recent Labs    11/01/20 0620 11/02/20 0655  NA 139 140  K 3.7 3.5  CL 110 111  CO2 21* 22  GLUCOSE 117* 137*  BUN 13 13  CREATININE 2.21* 2.20*  CALCIUM 9.9 9.9      Intake/Output Summary (Last 24 hours) at 11/02/2020 1254 Last data filed at 11/01/2020 1808 Gross per 24 hour  Intake 460 ml  Output --  Net 460 ml        Physical Exam: Vital Signs Blood pressure 129/88, pulse 73, temperature 98.2 F (36.8 C), temperature source Oral, resp. rate 15, height '5\' 4"'$  (1.626 m), weight 65.3 kg, SpO2 97 %. Gen: no distress, normal appearing HEENT: oral mucosa pink and moist, NCAT Cardio: Reg rate Chest: normal effort, normal rate of breathing Abd: soft, non-distended Ext: no edema Psych: pleasant, normal affect Skin: intact  Neurologic: intact, motor strength is 5/5 in right deltoid, bicep, tricep, grip, hip flexor, knee extensors, ankle dorsiflexor and  plantar flexor 3-/5 Left deltoid, 3 - biceps, 2 - triceps , 0/5 grip, 2- HF, KE, 0/5 ADF Sensory exam normal sensation to light touch and proprioception in bilateral upper and lower extremities  Musculoskeletal: reduced left shoulder abduction, neg impingement sign , no Left hand tenderness or swelling. Left heel cord tight  Assessment/Plan: 1. Functional deficits which require 3+ hours per day of interdisciplinary therapy in a comprehensive inpatient rehab setting. Physiatrist is providing close team supervision and 24 hour management of active medical problems listed below. Physiatrist and rehab team continue to assess barriers to discharge/monitor patient progress toward functional and medical goals  Care Tool:  Bathing    Body parts bathed by patient: Right arm, Chest, Abdomen, Right upper leg, Left upper leg, Face, Left lower leg, Front perineal area, Buttocks, Right lower leg   Body parts bathed by helper: Left arm     Bathing assist Assist Level: Minimal Assistance - Patient > 75%     Upper Body Dressing/Undressing Upper body dressing   What is the patient wearing?: Pull over shirt    Upper body assist Assist Level: Supervision/Verbal cueing    Lower Body Dressing/Undressing Lower body dressing      What is the patient wearing?: Pants  Lower body assist Assist for lower body dressing: Minimal Assistance - Patient > 75%     Toileting Toileting    Toileting assist Assist for toileting: Minimal Assistance - Patient > 75% (no clothing to manage secondary to just coming from the shower)     Transfers Chair/bed transfer  Transfers assist     Chair/bed transfer assist level: Contact Guard/Touching assist Chair/bed transfer assistive device: Armrests   Locomotion Ambulation   Ambulation assist      Assist level: Minimal Assistance - Patient > 75% Assistive device: Walker-rolling Max distance: 97f   Walk 10 feet activity   Assist  Walk 10 feet  activity did not occur: Safety/medical concerns  Assist level: Minimal Assistance - Patient > 75% Assistive device: Walker-rolling   Walk 50 feet activity   Assist Walk 50 feet with 2 turns activity did not occur: Safety/medical concerns  Assist level: Minimal Assistance - Patient > 75% Assistive device: Walker-rolling    Walk 150 feet activity   Assist Walk 150 feet activity did not occur: Safety/medical concerns         Walk 10 feet on uneven surface  activity   Assist Walk 10 feet on uneven surfaces activity did not occur: Safety/medical concerns   Assist level: Minimal Assistance - Patient > 75% Assistive device: WAeronautical engineerWill patient use wheelchair at discharge?: Yes Type of Wheelchair: Manual    Wheelchair assist level: Set up assist, Supervision/Verbal cueing Max wheelchair distance: 1574f   Wheelchair 50 feet with 2 turns activity    Assist        Assist Level: Supervision/Verbal cueing   Wheelchair 150 feet activity     Assist      Assist Level: Supervision/Verbal cueing   Blood pressure 129/88, pulse 73, temperature 98.2 F (36.8 C), temperature source Oral, resp. rate 15, height '5\' 4"'$  (1.626 m), weight 65.3 kg, SpO2 97 %.  Medical Problem List and Plan: 1.  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  2.  Impaired mobility, ambulating >200 feet, d/c lovenox             -antiplatelet therapy: continue ASA/Brillinta 3. Pain Management: Tylenol prn.  4. Mood: LCSW to follow for evaluation and support.              -antipsychotic agents: N/A 5. Neuropsych: This patient is capable of making decisions on her own behalf. 6. Skin/Wound Care: Routine pressure relief measures.  7. Fluids/Electrolytes/Nutrition: Monitor  I/O.Heart healthy diet. 8.  NSTEMI s/p PCI/DES: On ASA/Brillinta 9. Chronic combined CHF: Monitor for signs of overload. Check weights daily No peripheral edema, monitor after fluid bolus and KCL riders              --Continue Entresto, toprol XL Spironolactone and Crestor.             --SBP goal 130-150 to prevent cerebral hypoperfusion. Vitals:   11/01/20 1959 11/02/20 0350  BP: (!) 153/82 129/88  Pulse: 69 73  Resp: 17 15  Temp: 98.4 F (36.9 C) 98.2 F (36.8 C)  SpO2: 100% 97%  BP controlled 8/10 but below goal 10. T2DM: Hgb A1C-6.9. Monitor BS ac/hs.  Educate on CM diet.Newly diagnosed CBG (last 3)  Recent Labs    11/01/20 2055 11/02/20 0618 11/02/20 1138  GLUCAP 156* 132* 147*  Controlled 8/11  -- Continue metformin bid (recently added)  11. Anemia: Likely due to DAPT/Lovenox. Reported to have some pink vaginal discharge--vaginally v/s bladder source?             --monitor for signs of bleeding. Monitor serial H/H. 12. Constipation: BM yesterday. Continue Senna and warm prune juice. Does not want to try anything stronger for now. Decrease Senna to 1 tab HS 13. Hypokalemia: supplement 67mq today, monitor K+ weekly.  14. Intermittent SOB: Ordered incentive spirometer to improve breathing strength and ability over time. Provided incentive spirometer education. CXR obtained and stable   15.  AKI: appreciate nephro consult, improving, encouraged at least 6-8 glasses of water per day. Keep Entresto off for now.  16. Hypotension: hold Entresto for now as per nephroogy recs.  17. UA suggestive of UTI: pharmacy consulted regarding safest antibiotic given her renal function. UC ordered  LOS: 24 days A FACE TO FACE EVALUATION WAS PERFORMED  KClide DeutscherRaulkar 11/02/2020, 12:54 PM

## 2020-11-02 NOTE — Progress Notes (Signed)
Occupational Therapy Weekly Progress Note  Patient Details  Name: Tracey Meyer MRN: [id number] Date of Birth: November 12, 1960  Beginning of progress report period: October 25, 2020 End of progress report period: November 02, 2020  Today's Date: 11/02/2020 OT Individual Time: 7253-6644 OT Individual Time Calculation (min): 54 min    Patient has met 3 of 3 short term goals.  Tracey Meyer is making steady progress with OT at this time.  She is able to complete bathing and dressing tasks at an overall min assist sit to stand.  Transfers are min to mod assist stand pivot as well secondary to persistent LLE weakness.  She continues to improve with LUE function and is currently Brunnstrum stage III-IV in the left arm and hand.  Functionally, she is able to use it at a supervision level for holding items while trying to open them with the right.  She needs min assist if using to try and wash the RUE.  Increased flexor tone is still noted in digit flexors as well as elbow flexors.  She reports some increased pain in the MP/IP joint of the left thumb with extension for weightbearing but not in flexion.  She continues to exhibit increased extensor tone in the LLE as well resulting in increased hyperextension in the left knee with mobility as well as decreased left hip extension.  Feel she is making steady progress overall but still needs min to mod assist for ADLs.  Will benefit from continued CIR level therapy with anticipated discharge 8/15 if medically stable.  Family has not been able to participate in education as of this time secondary to transportation issues.  May have to provide on day of discharge.     Patient continues to demonstrate the following deficits: muscle weakness and muscle paralysis, impaired timing and sequencing, unbalanced muscle activation, and decreased coordination, decreased awareness, decreased problem solving, decreased safety awareness, decreased memory, and delayed processing, and  decreased sitting balance, decreased standing balance, decreased postural control, hemiplegia, and decreased balance strategies and therefore will continue to benefit from skilled OT intervention to enhance overall performance with BADL and Reduce care partner burden.  Patient progressing toward long term goals..  Continue plan of care.  OT Short Term Goals Week 4:   Continue working on established LTGs set at min assist level overall.   Skilled Therapeutic Interventions/Progress Updates:    Pt completed bathing and dressing during OT session.  Supervision with min instructional cueing needed for transition supine to sit on the right side of the bed.  She then needed mod facilitation for ambulation to the shower seat without an assistive device secondary to LLE hemiparesis.  Mod assist for placement of the LLE as well as for trunk alignment and left knee and hip stability.  She was able to undress in sitting with close supervision and then completed bathing at min assist level.  Min facilitation with use of the LUE for washing the RUE.  Transferred to the wheelchair at mod assist level in order to work on dressing tasks sit to stand at the sink.  Supervision with min instructional cueing for donning pullover shirt.  Min assist for pants and gripper socks as well, with min to mod assist for helping to keep the LLE crossed over the right knee to donn clothing.  Finished session with min assist squat pivot transfer to the bed with supervision for sit to supine.  Call button and phone in reach with safety alarm in place.  Therapy Documentation Precautions:  Precautions Precautions: Fall Precaution Comments: L hemiparesis Restrictions Weight Bearing Restrictions: No   Pain: Pain Assessment Pain Scale: Faces Faces Pain Scale: Hurts a little bit Pain Type: Acute pain Pain Location: Hand Pain Orientation: Left Pain Descriptors / Indicators: Discomfort Pain Onset: With Activity Pain  Intervention(s): Repositioned;Emotional support ADL: See Care Tool Section for some details of mobility and selfcare   Therapy/Group: Individual Therapy  Tracey Meyer OTR/L 11/02/2020, 5:21 PM

## 2020-11-02 NOTE — Progress Notes (Signed)
[facility] Progress Note  Name: ASENA IGEL MRN: [id number] DOB: 02-21-1961  Subjective:  strict ins/outs not available. 10 urine voids over 8/11.  Left arm weakness after stroke.   Review of systems:  Some shortness of breath  Denies n/v No chest pain  ------- Background on consult:  LARAINE MARGIOTTA is an 60 y.o. female with HTN, DM, HL, CAD s/p PCI 09/2020, CVAs 09/2020 who is seen by nephrology for evaluation and management of AKI.  Initially treated for NSTEMI with DES to occluded LAD 7/2 and discharged 7/4; course also notable for HTN 215/110.  Readmitted 7/7 with L weakness and slurred speech found to have multiple BL cerebral and cerebellar infarcts in imaging.  W/u concluded likely cardioembolic.  EF 99991111.  She was transferred to CIR on 10/09/20.   She's making progress and plan was d/c 8/12 prior to AKI.   Cr was in the 0.8-1.2 range from 7/2 to 7/25.  On 8/1 it was 1.3, 8/10 1.9 > 8/10 1.9 > 1.9 > 8/11 2.2.  She was given a NS 573m bolus yesterday and rec'd 50/hr of 1/2 NS for about 4 hrs.  No iodinated contrast since 7/11.  Entresto 24/26 BID d/c'd after 8/9 PM dose. No fleets. No NSAIDs.  No UOP or weights have been recorded.  UA 7/8 neg for protein, looked like UTI, no culture done.   No renal imaging to date.  BC has been normal.   BPs have mainly been in the 130-140 range, a few in the past 3 nights in the 110s/60s.   This was asymptomatic. She feels fairly well - says po intake is ok, drinking fluids no difficulty.  Did have med induced loose stools in the past few days after constipation.  No LUTs, no hematuria or dysuria.  Reports normal volume of urine without straining to void.       Intake/Output Summary (Last 24 hours) at 11/02/2020 1406 Last data filed at 11/01/2020 1808 Gross per 24 hour  Intake 220 ml  Output --  Net 220 ml    Vitals:  Vitals:   11/01/20 1456 11/01/20 1959 11/02/20 0350 11/02/20 0500  BP: 134/90 (!) 153/82 129/88    Pulse: 71 69 73   Resp: '18 17 15   '$ Temp: 98.2 F (36.8 C) 98.4 F (36.9 C) 98.2 F (36.8 C)   TempSrc: Oral Oral Oral   SpO2: 99% 100% 97%   Weight:    65.3 kg  Height:         Physical Exam:  General adult female in bed in no acute distress HEENT normocephalic atraumatic extraocular movements intact sclera anicteric Neck supple trachea midline Lungs clear to auscultation bilaterally normal work of breathing at rest on room air  Heart S1S2 no rub Abdomen soft nontender nondistended Extremities no edema  Psych normal mood and affect Neuro left arm weakness; alert and conversant; follows commands and provides hx  Medications reviewed   Labs:  BMP Latest Ref Rng & Units 11/02/2020 11/01/2020 10/31/2020  Glucose 70 - 99 mg/dL 137(H) 117(H) 121(H)  BUN 6 - 20 mg/dL '13 13 12  '$ Creatinine 0.44 - 1.00 mg/dL 2.20(H) 2.21(H) 1.89(H)  Sodium 135 - 145 mmol/L 140 139 139  Potassium 3.5 - 5.1 mmol/L 3.5 3.7 3.9  Chloride 98 - 111 mmol/L 111 110 111  CO2 22 - 32 mmol/L 22 21(L) 20(L)  Calcium 8.9 - 10.3 mg/dL 9.9 9.9 9.9     Assessment/Plan:   **AKI:  Suspect this was mild volume depletion + mild hypotension + ARB.  Renal US with no hydro and normal echogenicity.  Cr BL 1-1.2.  on 10/22/20 Cr was 1.27.  next check 10/29/20 Cr was 1.94.  UA with 30 mg/dl protein and 6-10 RBC; setting of UTI - Entresto has been D/C'd - would keep off for now  - daily weights and strict ins/outs - UTI noted   ** UTI  - urine culture ordered - rocephin daily x 3 days for now - can narrow per culture  - Would obtain urine culture before abx    **HTN:  neurology rec goal BP 130-150 due to significant posterior circulation vascular stenosis.  Had some modest hypotension the past 2 nights and entresto is off now.     **CAD s/p recent PCI: on DAPT, statin.   **CVA with L hemiparesis:  on CIR now for therapy - plans for discharge fairly soon.    Dispo: she states discharge is now tentatively  Monday   Claudia Desanctis, MD 11/02/2020 2:29 PM

## 2020-11-03 DIAGNOSIS — E876 Hypokalemia: Secondary | ICD-10-CM

## 2020-11-03 DIAGNOSIS — I5042 Chronic combined systolic (congestive) and diastolic (congestive) heart failure: Secondary | ICD-10-CM

## 2020-11-03 DIAGNOSIS — N179 Acute kidney failure, unspecified: Secondary | ICD-10-CM

## 2020-11-03 DIAGNOSIS — N390 Urinary tract infection, site not specified: Secondary | ICD-10-CM

## 2020-11-03 LAB — GLUCOSE, CAPILLARY
Glucose-Capillary: 141 mg/dL — ABNORMAL HIGH (ref 70–99)
Glucose-Capillary: 162 mg/dL — ABNORMAL HIGH (ref 70–99)
Glucose-Capillary: 181 mg/dL — ABNORMAL HIGH (ref 70–99)
Glucose-Capillary: 140 mg/dL — ABNORMAL HIGH (ref 70–99)

## 2020-11-03 MED ORDER — FUROSEMIDE 40 MG PO TABS
40.0000 mg | ORAL_TABLET | Freq: Once | ORAL | Status: AC
  Administered 2020-11-03: 40 mg via ORAL
  Filled 2020-11-03: qty 1

## 2020-11-03 NOTE — Progress Notes (Signed)
PROGRESS NOTE   Subjective/Complaints: Patient seen laying in bed this morning.  She states she slept well overnight.  She notes her wrist is sore.  She also has questions regarding urine studies, potassium, and discharge.  She was seen by nephrology yesterday, notes reviewed-started on IV Rocephin for UTI.  ROS: Denies CP, SOB, N/V/D  Objective:   US RENAL  Result Date: 11/01/2020 CLINICAL DATA:  Acute kidney injury EXAM: RENAL / URINARY TRACT ULTRASOUND COMPLETE COMPARISON:  None. FINDINGS: Right Kidney: Renal measurements: 10 x 4.5 x 4.5 cm = volume: 106 mL. Echogenicity is within normal limits. No concerning renal mass, shadowing calculus or hydronephrosis. Left Kidney: Renal measurements: 11.2 x 6 x 4.9 cm = volume: 172 mL. Echogenicity is within normal limits. No concerning renal mass, shadowing calculus or hydronephrosis. Bladder: Appears normal for degree of bladder distention. Other: None. IMPRESSION: Unremarkable renal ultrasound. Electronically Signed   By: Lovena Le M.D.   On: 11/01/2020 21:43   No results for input(s): WBC, HGB, HCT, PLT in the last 72 hours.    Recent Labs    11/01/20 0620 11/02/20 0655  NA 139 140  K 3.7 3.5  CL 110 111  CO2 21* 22  GLUCOSE 117* 137*  BUN 13 13  CREATININE 2.21* 2.20*  CALCIUM 9.9 9.9       Intake/Output Summary (Last 24 hours) at 11/03/2020 1256 Last data filed at 11/03/2020 0817 Gross per 24 hour  Intake 1126 ml  Output --  Net 1126 ml         Physical Exam: Vital Signs Blood pressure 136/73, pulse 62, temperature 98 F (36.7 C), temperature source Oral, resp. rate 14, height '5\' 4"'$  (1.626 m), weight 66.4 kg, SpO2 100 %. Constitutional: No distress . Vital signs reviewed. HENT: Normocephalic.  Atraumatic. Eyes: EOMI. No discharge. Cardiovascular: No JVD.  RRR. Respiratory: Normal effort.  No stridor.  Bilateral clear to auscultation. GI: Non-distended.  BS  +. Skin: Warm and dry.  Intact. Psych: Flat..  Normal behavior. Musc: No edema in extremities.  No tenderness in extremities. Neuro: Alert Motor: RUE/RLE: 5/5 proximal distal LUE: Shoulder abduction, elbow flexion/extension 2/5, distally 0/5 Left lower extremity: Flexion, knee extension 2+/5, ankle dorsiflexion 0/5  Assessment/Plan: 1. Functional deficits which require 3+ hours per day of interdisciplinary therapy in a comprehensive inpatient rehab setting. Physiatrist is providing close team supervision and 24 hour management of active medical problems listed below. Physiatrist and rehab team continue to assess barriers to discharge/monitor patient progress toward functional and medical goals  Care Tool:  Bathing    Body parts bathed by patient: Right arm, Chest, Abdomen, Right upper leg, Left upper leg, Face, Left lower leg, Front perineal area, Buttocks, Right lower leg   Body parts bathed by helper: Left arm     Bathing assist Assist Level: Minimal Assistance - Patient > 75%     Upper Body Dressing/Undressing Upper body dressing   What is the patient wearing?: Pull over shirt    Upper body assist Assist Level: Supervision/Verbal cueing    Lower Body Dressing/Undressing Lower body dressing      What is the patient wearing?: Pants  Lower body assist Assist for lower body dressing: Minimal Assistance - Patient > 75%     Toileting Toileting    Toileting assist Assist for toileting: Minimal Assistance - Patient > 75%     Transfers Chair/bed transfer  Transfers assist     Chair/bed transfer assist level: Minimal Assistance - Patient > 75% (stand pivot) Chair/bed transfer assistive device: Armrests   Locomotion Ambulation   Ambulation assist      Assist level: Moderate Assistance - Patient 50 - 74% Assistive device: No Device Max distance: 10'   Walk 10 feet activity   Assist  Walk 10 feet activity did not occur: Safety/medical  concerns  Assist level: Minimal Assistance - Patient > 75% Assistive device: Walker-rolling   Walk 50 feet activity   Assist Walk 50 feet with 2 turns activity did not occur: Safety/medical concerns  Assist level: Minimal Assistance - Patient > 75% Assistive device: Walker-rolling    Walk 150 feet activity   Assist Walk 150 feet activity did not occur: Safety/medical concerns         Walk 10 feet on uneven surface  activity   Assist Walk 10 feet on uneven surfaces activity did not occur: Safety/medical concerns   Assist level: Minimal Assistance - Patient > 75% Assistive device: Aeronautical engineer Will patient use wheelchair at discharge?: Yes Type of Wheelchair: Manual    Wheelchair assist level: Set up assist, Supervision/Verbal cueing Max wheelchair distance: 167f    Wheelchair 50 feet with 2 turns activity    Assist        Assist Level: Supervision/Verbal cueing   Wheelchair 150 feet activity     Assist      Assist Level: Supervision/Verbal cueing   Blood pressure 136/73, pulse 62, temperature 98 F (36.7 C), temperature source Oral, resp. rate 14, height '5\' 4"'$  (1.626 m), weight 66.4 kg, SpO2 100 %.  Medical Problem List and Plan: 1.  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  Impaired mobility, ambulating >200 feet, d/c lovenox             -antiplatelet therapy: continue ASA/Brillinta 3. Pain Management: Tylenol prn.  4. Mood: LCSW to follow for evaluation and support.              -antipsychotic agents: N/A 5. Neuropsych: This patient is capable of making decisions on her own behalf. 6. Skin/Wound Care: Routine pressure relief measures.  7. Fluids/Electrolytes/Nutrition: Monitor I/Os. Heart healthy diet. 8.  NSTEMI s/p PCI/DES: On ASA/Brillinta 9. Chronic combined CHF: Monitor for signs of overload.  No peripheral edema, monitor after fluid bolus and KCL  riders              --Continue toprol XL Spironolactone and Crestor.  Entresto on hold             --SBP goal 130-150 to prevent cerebral hypoperfusion. Filed Weights   10/09/20 1400 11/02/20 0500 11/03/20 0426  Weight: 71.4 kg 65.3 kg 66.4 kg   10. T2DM: Hgb A1C-6.9. Monitor BS ac/hs. Educate on CM diet.Newly diagnosed CBG (last 3)  Recent Labs    11/02/20 2128 11/03/20 0634 11/03/20 1127  GLUCAP 194* 141* 162*   Elevated on 8/13, will require oral agent once renal function improves -- Metformin bid (recently added) on hold 11. Anemia: Likely due to DAPT/Lovenox. Reported to have some pink vaginal discharge--vaginally v/s bladder  source?             --monitor for signs of bleeding. Monitor serial H/H.  Hemoglobin 11.5 on 8/8 12. Constipation: Continue Senna and warm prune juice. Does not want to try anything stronger for now.  13. Hypokalemia: supplement 75mq x1, monitor K+ weekly.   Potassium 3.5 on 8/12 14. Intermittent SOB: Ordered incentive spirometer to improve breathing strength and ability over time. Provided incentive spirometer education. CXR obtained and stable   15.  AKI: appreciate nephro consult  Improving Keep Entresto off for now.  Creatinine 2.20 on 8/12 16. Hypotension: hold Entresto for now as per nephroogy recs.   BP?  Trending up again 17. UA suggestive of UTI: pharmacy consulted regarding safest antibiotic given her renal function.  Urine culture pending Continue empiric IV Rocephin  LOS: 25 days A FACE TO FACE EVALUATION WAS PERFORMED  Nyair Depaulo ALorie Phenix8/13/2022, 12:56 PM

## 2020-11-03 NOTE — Progress Notes (Signed)
Physical Therapy Session Note  Patient Details  Name: Tracey Meyer MRN: [id number] Date of Birth: October 13, 1960  Today's Date: 11/03/2020 PT Individual Time: [id number] PT Individual Time Calculation (min): 46 min   Short Term Goals: Week 3:  PT Short Term Goal 1 (Week 3): = to LTGs based on ELOS  Skilled Therapeutic Interventions/Progress Updates:    Pt received sitting in w/c and agreeable to therapy session. Pt reports that her husband is not able to get transportation to hospital for hands-on education/training. Pt reports that her DME is being delivered to her house.   Donned B shoes and L LE AFO total assist for time management. Pt reports AFO slightly rubbing along interior area just proximal to the medial malleolus - no redness or skin irritation noted in that area - pt states she didn't feel this same rubbing during our session that she was experiencing earlier today when wearing the AFO - educated pt to ensure brace straps on snug to hold foot/lower leg back inside of the brace fully. Reinforced education on wearing tall socks and assessing skin after wearing AFO to ensure skin intact - also educated to notify orthotist through [facility] if any modifications of brace are needed.   R hemi-technique w/c propulsion ~172f to main therapy gym with supervision - pt has increased difficulty managing w/c up slight inclines in the flooring requiring increased effort and time - overall, pt demos inefficient propulsion technique sliding R hand along wheel after forward push causing friction and decreased forward movement as well as having decreased traction through R LE - cuing for improvement but little change noticed.   Sit<>stands to/from RW with CGA for steadying - pt able to manage L UE on/off RW orthotic without assist. Gait training 758fusing RW with CGA for safety/steadying - pt continues to demo some compensatory strategies of R lateral trunk lean in order to advance L LE during swing or  slight "squat" to get L LE in flexed position prior to advancing during swing; does have mild excessive L hip external rotation during swing due to poor foot clearance causing toe-drag - cuing throughout for improvement - pt does demo improved L hip extension and anterior pelvic movement over L stance phase while wearing AFO resulting in improved stance stability.  Discussed stair navigation safety with education on pt to perform car transfer to w/c and then have husband assist her in w/c to the stairs. Pt confirms only 2 STE but continues to report concern of poor R handrail support. Discussed ensuring pt's husband prepares home entry via opening door and placing RW at top of the steps.  Performed stair navigation using R HHA to replicate her husband providing assistance if HR not safe. Navigated 4 steps 2x with heavy mod assist initially progressed to heavy min assist via R HHA - greatest amount of assistance needed on ascent as pt unable to perform sufficient L hip/knee flexion to get foot up onto the step during step-to pattern. Pt also with some fear of falling impeding her motor planning with this task -  resulting in her activating knee extensors instead of hip/knee flexors. Discussed having +2 assist help manage L LE up onto the step at home - pt in agreement and planning to talk to family about arranging this.  Transported back to room. R stand pivot w/c>EOB using arm rest and bed for support with R UE and supervision for safety. Doffed shoes and AFO total assist for time management. Sit>supine with supervision not  using bed features. Pt left supine in bed with needs in reach and bed alarm on.   Therapy Documentation Precautions:  Precautions Precautions: Fall Precaution Comments: L hemiparesis Restrictions Weight Bearing Restrictions: No   Pain: Denies pain during session.   Therapy/Group: Individual Therapy  Tawana Scale , PT, DPT, NCS, CSRS 11/03/2020, 12:18 PM

## 2020-11-03 NOTE — Progress Notes (Signed)
[facility] Progress Note  Name: Tracey Meyer MRN: [id number] DOB: July 07, 1960  Subjective:  strict ins/outs still not available. Urine 2 voids over 8/12.  No labs today.   Review of systems:  Some shortness of breath with therapy Denies n/v No chest pain  ------- Background on consult:  Tracey Meyer is an 60 y.o. female with HTN, DM, HL, CAD s/p PCI 09/2020, CVAs 09/2020 who is seen by nephrology for evaluation and management of AKI.  Initially treated for NSTEMI with DES to occluded LAD 7/2 and discharged 7/4; course also notable for HTN 215/110.  Readmitted 7/7 with L weakness and slurred speech found to have multiple BL cerebral and cerebellar infarcts in imaging.  W/u concluded likely cardioembolic.  EF [id number].  She was transferred to CIR on 10/09/20.   She's making progress and plan was d/c 8/12 prior to AKI.   Cr was in the 0.8-1.2 range from 7/2 to 7/25.  On 8/1 it was 1.3, 8/10 1.9 > 8/10 1.9 > 1.9 > 8/11 2.2.  She was given a NS 59m bolus yesterday and rec'd 50/hr of 1/2 NS for about 4 hrs.  No iodinated contrast since 7/11.  Entresto 24/26 BID d/c'd after 8/9 PM dose. No fleets. No NSAIDs.  No UOP or weights have been recorded.  UA 7/8 neg for protein, looked like UTI, no culture done.   No renal imaging to date.  BC has been normal.   BPs have mainly been in the 130-140 range, a few in the past 3 nights in the 110s/60s.   This was asymptomatic. She feels fairly well - says po intake is ok, drinking fluids no difficulty.  Did have med induced loose stools in the past few days after constipation.  No LUTs, no hematuria or dysuria.  Reports normal volume of urine without straining to void.       Intake/Output Summary (Last 24 hours) at 11/03/2020 1504 Last data filed at 11/03/2020 1326 Gross per 24 hour  Intake 1486 ml  Output --  Net 1486 ml    Vitals:  Vitals:   11/02/20 2015 11/03/20 0426 11/03/20 0453 11/03/20 1258  BP: 136/74  136/73 (!) 158/89   Pulse: 70  62 62  Resp: '14  14 18  '$ Temp: 98.5 F (36.9 C)  98 F (36.7 C) 97.7 F (36.5 C)  TempSrc: Oral  Oral Oral  SpO2: 95%  100% 99%  Weight:  66.4 kg    Height:         Physical Exam:  General adult female in bed in no acute distress HEENT normocephalic atraumatic extraocular movements intact sclera anicteric Neck supple trachea midline Lungs clear to auscultation bilaterally normal work of breathing at rest on room air  Heart S1S2 no rub Abdomen soft nontender nondistended Extremities no edema  Psych normal mood and affect Neuro left arm weakness; alert and conversant; follows commands and provides hx  Medications reviewed   Labs:  BMP Latest Ref Rng & Units 11/02/2020 11/01/2020 10/31/2020  Glucose 70 - 99 mg/dL 137(H) 117(H) 121(H)  BUN 6 - 20 mg/dL '13 13 12  '$ Creatinine 0.44 - 1.00 mg/dL 2.20(H) 2.21(H) 1.89(H)  Sodium 135 - 145 mmol/L 140 139 139  Potassium 3.5 - 5.1 mmol/L 3.5 3.7 3.9  Chloride 98 - 111 mmol/L 111 110 111  CO2 22 - 32 mmol/L 22 21(L) 20(L)  Calcium 8.9 - 10.3 mg/dL 9.9 9.9 9.9     Assessment/Plan:   **AKI:  Suspect this was mild volume depletion + mild hypotension + ARB.  Renal US with no hydro and normal echogenicity.  Cr BL 1-1.2.  on 10/22/20 Cr was 1.27.  next check 10/29/20 Cr was 1.94.  UA with 30 mg/dl protein and 6-10 RBC; setting of UTI - Delene Loll has been D/C'd (last dose 8/9) - would keep off for now  - daily weights and strict ins/outs - UTI noted  - Lasix 40 mg PO once today  ** UTI  - urine culture pending - rocephin daily x 3 days for now - can narrow per culture   **HTN:  neurology rec goal BP 130-150 due to significant posterior circulation vascular stenosis.  Had some modest hypotension earlier this week and entresto is off now.     **CAD s/p recent PCI: on DAPT, statin.   **CVA with L hemiparesis:  on CIR now for therapy - plans for discharge fairly soon.    Dispo: she states discharge is now tentatively  Monday   Claudia Desanctis, MD 11/03/2020 3:17 PM

## 2020-11-03 NOTE — Progress Notes (Signed)
Occupational Therapy Discharge Summary  Patient Details  Name: Tracey Meyer MRN: [id number] Date of Birth: 04-19-1960  Today's Date: 11/06/2020      Patient has met 10 of 12 long term goals due to improved activity tolerance, improved balance, postural control, ability to compensate for deficits, functional use of  LEFT upper and LEFT lower extremity, improved attention, improved awareness, and improved coordination.  Patient to discharge at [facility] Assist level.  Patient's care partner requires assistance to provide the necessary physical assistance at discharge.    Reasons goals not met: Pt still needs supervision for memory as well as mod assist for LB dressing tasks.   Recommendation:  Patient will benefit from ongoing skilled OT services in outpatient setting to continue to advance functional skills in the area of BADL, iADL, and Reduce care partner burden.  Pt continues to demonstrate moderate LUE and LLE hemiparesis impacting ADL and IADL independence.  Feel she will continue to benefit from continued outpatient OT to address these deficits.   Equipment: Tub bench, 3:1  Reasons for discharge: treatment goals met and discharge from hospital  Patient/family agrees with progress made and goals achieved: Yes  OT Discharge Precautions/Restrictions    Falls, left hemiparesis    ADL ADL Eating: Modified independent Where Assessed-Eating: Wheelchair Grooming: Modified independent Where Assessed-Grooming: Wheelchair Upper Body Bathing: Supervision/safety Where Assessed-Upper Body Bathing: Shower Lower Body Bathing: Minimal assistance Where Assessed-Lower Body Bathing: Shower Upper Body Dressing: Supervision/safety Where Assessed-Upper Body Dressing: Sitting at sink, Wheelchair Lower Body Dressing: Moderate assistance Where Assessed-Lower Body Dressing: Standing at sink, Sitting at sink Toileting: Minimal assistance Where Assessed-Toileting: Bedside Commode Toilet  Transfer: Minimal assistance Toilet Transfer Method: Stand pivot Toilet Transfer Equipment: Drop arm bedside commode Tub/Shower Transfer: Minimal assistance Tub/Shower Transfer Method: Stand pivot Tub/Shower Equipment: Facilities manager: Minimal assistance Social research officer, government Method: Radiographer, therapeutic: Gaffer Baseline Vision/History: No visual deficits Patient Visual Report: Blurring of vision Vision Assessment?: Yes Ocular Range of Motion: Within Functional Limits Alignment/Gaze Preference: Within Defined Limits Tracking/Visual Pursuits: Able to track stimulus in all quads without difficulty Saccades: Within functional limits Visual Fields: No apparent deficits Perception  Perception: Within Functional Limits Praxis Praxis: Intact Cognition Overall Cognitive Status: Impaired/Different from baseline Arousal/Alertness: Awake/alert Attention: Sustained;Selective;Alternating;Divided Focused Attention: Appears intact Sustained Attention: Appears intact Selective Attention: Appears intact Alternating Attention: Appears intact Divided Attention: Impaired Memory: Impaired Memory Impairment: Decreased recall of new information Awareness: Impaired Awareness Impairment: Anticipatory impairment Problem Solving: Impaired Problem Solving Impairment: Functional basic Behaviors: Impulsive Safety/Judgment: Impaired Comments: slightly impulsive Sensation Sensation Light Touch: Appears Intact Hot/Cold: Appears Intact Proprioception: Appears Intact Additional Comments: light touch and proprioception intact in BUEs Coordination Gross Motor Movements are Fluid and Coordinated: No Fine Motor Movements are Fluid and Coordinated: No Coordination and Movement Description: Still with moderate LUE hemiparesis.  Needs min assist to integrate into bathing tasks for washing, but mod to max assist for dressing. Motor   Motor Motor: Hemiplegia;Abnormal postural alignment and control Mobility  Bed Mobility Bed Mobility: Supine to Sit;Sit to Supine Supine to Sit: Supervision/Verbal cueing Sit to Supine: Supervision/Verbal cueing Transfers Sit to Stand: Contact Guard/Touching assist Stand to Sit: Contact Guard/Touching assist  Trunk/Postural Assessment  Cervical Assessment Cervical Assessment: Exceptions to [facility] (cervical protraction) Thoracic Assessment Thoracic Assessment: Exceptions to [facility] (thoracic rounding) Lumbar Assessment Lumbar Assessment: Exceptions to [facility] (posterior pelvic tilt)  Balance Balance Balance Assessed: Yes Static Sitting Balance Static Sitting - Balance Support:  No upper extremity supported;Feet supported Static Sitting - Level of Assistance: 7: Independent Dynamic Sitting Balance Dynamic Sitting - Balance Support: During functional activity Dynamic Sitting - Level of Assistance: 5: Stand by assistance Static Standing Balance Static Standing - Balance Support: During functional activity Static Standing - Level of Assistance:  (contact guard) Dynamic Standing Balance Dynamic Standing - Balance Support: During functional activity Dynamic Standing - Level of Assistance: 3: Mod assist Extremity/Trunk Assessment RUE Assessment RUE Assessment: Within Functional Limits General Strength Comments: AROM and strength WFLS for selfcare tasks, but not formally tested. LUE Assessment LUE Assessment: Exceptions to [facility] Passive Range of Motion (PROM) Comments: shoulder flexion 0-160 degrees, elbow flexion WFLs with some pain at terminal extension,  slight flexor tone in the elbow, wrist and dgits at 2/4 on the Modified [place] Scale.  Tone increases with functional movement. Active Range of Motion (AROM) Comments: Brunnstrum stage IV in the left arm and hand with supervision to incorporate as a stabilizer during selfcare tasks as well as min assist for bathing tasks and max assist for  dressing.  She demonstrates 80% of full digit flexion with 50% gross digit extension.   Shivaay Stormont OTR/L 11/03/2020, 5:57 PM

## 2020-11-03 NOTE — Progress Notes (Signed)
Occupational Therapy Session Note  Patient Details  Name: Tracey Meyer MRN: [id number] Date of Birth: 20-Oct-1960  Today's Date: 11/03/2020 OT Individual Time: 1005-1044 OT Individual Time Calculation (min): 39 min    Short Term Goals: Week 4:  OT Short Term Goal 1 (Week 4): Continue working on [employer] at min assist level overall.  Skilled Therapeutic Interventions/Progress Updates:    Pt worked on donning her shoes and AFO to begin session sitting in the wheelchair.  She needed min assist for donning the right shoe with total assist for the left.  Took her down to the tub room where she practiced simulated tub/shower transfers with use of the RW.  She was able to complete with use of the tub bench at min assist level, but demonstrates decreased efficiency with advancement of the LLE with external rotation noted.  Pt reports that her wheelchair will likely not fit in the bathroom and she would have to complete transfer with use of the RW.  Recommend until she gets more efficient with mobility before trying transfer at home with her spouse, since we have not been able to practice with him.  Next, had her complete multiple stand pivot transfers to and from the therapy mat with min assist overall.  Instructed pt that this is the way I want her spouse to assist her at home, standing in front on the left side with the gait belt in place.  Same method should be used for toileting tasks as well as LB clothing management.  Finished session with return to the wheelchair and transfer back to the room.  NMES was applied to the dorsal left forearam for facilitation of digit extension secondary to flexor tone.  Intensity was set on level 13 at the below parameters.  She tolerated for 1 hr without any adverse reactions.  She was left with the call button and phone in reach with safety belt and call button in reach.   [place] Stim One 330 pulse width 35 Hz pulse rate On 8 sec/ off 8 sec Ramp up/  down 2 sec Symmetrical Biphasic wave form  Max intensity 124m at 500 Ohm load     Therapy Documentation Precautions:  Precautions Precautions: Fall Precaution Comments: L hemiparesis Restrictions Weight Bearing Restrictions: No   Pain: Pain Assessment Pain Scale: 0-10 Pain Score: 0-No pain ADL: See Care Tool Section for some details of mobility and selfcare  Therapy/Group: Individual Therapy  Yamili Lichtenwalner OTR/L 11/03/2020, 11:31 AM

## 2020-11-03 NOTE — Progress Notes (Signed)
Physical Therapy Discharge Summary  Patient Details  Name: Tracey Meyer MRN: [id number] Date of Birth: 06-Nov-1960  Patient has met 9 of 11 long term goals due to improved activity tolerance, improved balance, improved postural control, increased strength, ability to compensate for deficits, functional use of  left upper extremity and left lower extremity, improved attention, and improved awareness.  Patient to discharge at a wheelchair level  set-up assist  and then ambulating short distance using RW with CGA.   Patient's care partner requires assistance to provide the necessary physical assistance at discharge.  Reasons goals not met: Patient continues to require supervision for dynamic sitting balance and min assist for car transfers.  Recommendation:  Patient will benefit from ongoing skilled PT services in outpatient setting to continue to advance safe functional mobility, address ongoing impairments in L LE NMR, standing balance, gait training with LRAD, stair navigation training, and minimize fall risk.  Equipment: Hemi-height 18x18 wheelchair and RW  Reasons for discharge: treatment goals met and discharge from hospital  Patient/family agrees with progress made and goals achieved: Yes  PT Discharge Precautions/Restrictions Precautions Precautions: Fall;Other (comment) Precaution Comments: L hemiparesis Restrictions Weight Bearing Restrictions: No Pain Pain Assessment Pain Scale: 0-10 Pain Score: 0-No pain Perception  Perception Perception: Within Functional Limits Praxis Praxis: Intact  Cognition Overall Cognitive Status: Impaired/Different from baseline Arousal/Alertness: Awake/alert Orientation Level: Oriented X4 Attention: Focused;Sustained;Selective Focused Attention: Appears intact Sustained Attention: Appears intact Selective Attention: Appears intact Safety/Judgment: Appears intact Sensation Sensation Light Touch: Appears Intact Hot/Cold: Not  tested Proprioception: Appears Intact Stereognosis: Not tested Coordination Gross Motor Movements are Fluid and Coordinated: No Coordination and Movement Description: Still with moderate L hemiparesis resulting in impaired gross motor movements Motor  Motor Motor: Hemiplegia;Abnormal postural alignment and control Motor - Discharge Observations: L hemiparesis with hypertonia in L UE  Mobility Bed Mobility Bed Mobility: Supine to Sit;Sit to Supine Supine to Sit: Supervision/Verbal cueing Sit to Supine: Supervision/Verbal cueing Transfers Transfers: Sit to Stand;Stand to Sit;Stand Pivot Transfers Sit to Stand: Contact Guard/Touching assist Stand to Sit: Contact Guard/Touching assist Stand Pivot Transfers: Contact Guard/Touching assist Transfer (Assistive device): Rolling walker (during sit<>stands) Locomotion  Gait Ambulation: Yes Gait Assistance: Contact Guard/Touching assist Gait Distance (Feet): 75 Feet Assistive device: Rolling walker;Other (Comment) (L LE custom AFO and L UE RW orthotic) Gait Gait: Yes Gait Pattern: Impaired Gait Pattern: Step-through pattern;Decreased stance time - left;Decreased stride length;Decreased dorsiflexion - left;Narrow base of support;Poor foot clearance - left;Lateral trunk lean to right (R trunk lean compensation to assist w/ L LE advancement during swing & pt performing a slight "squat" to break the extensor pattern in L LE in order to flex limb for improved advancement during swing - demos improved L stance stability wearing custom AFO) Gait velocity: significantly decreased Stairs / Additional Locomotion Stairs: Yes Stairs Assistance: Minimal Assistance - Patient > 75% Stair Management Technique: Two rails Number of Stairs: 8 Height of Stairs: 6 Ramp: Minimal Assistance - Patient >75% Wheelchair Mobility Wheelchair Mobility: Yes Wheelchair Assistance: Set up assist Wheelchair Propulsion: Right upper extremity;Right lower  extremity Wheelchair Parts Management: Needs assistance Distance: 12f  Trunk/Postural Assessment  Cervical Assessment Cervical Assessment: Ex[facility] to [facility](cerv[facility]) Thoracic Assessment Thoracic Assessment: Exceptions to [facility](L scapular protraction with mild thoracic rounding) Lumbar Assessment Lumbar Assessment: Exceptions to [facility](posterior pelvic tilt in sitting)  Balance Balance Balance Assessed: Yes Static Sitting Balance Static Sitting - Balance Support: No upper extremity supported;Feet supported Static Sitting - Level of Assistance: 7:  Independent Dynamic Sitting Balance Dynamic Sitting - Balance Support: During functional activity Dynamic Sitting - Level of Assistance: 5: Stand by assistance Static Standing Balance Static Standing - Balance Support: During functional activity;Right upper extremity supported Static Standing - Level of Assistance: Other (comment) (CGA) Dynamic Standing Balance Dynamic Standing - Balance Support: During functional activity;Right upper extremity supported Dynamic Standing - Level of Assistance: 4: Min assist Extremity Assessment  RLE Assessment RLE Assessment: Within Functional Limits RLE Strength Right Hip Flexion: 4+/5 Right Knee Flexion: 5/5 Right Knee Extension: 5/5 Right Ankle Dorsiflexion: 5/5 LLE Assessment LLE Assessment: Exceptions to [facility] LLE Strength Left Hip Flexion: 3-/5 Left Knee Flexion: 2/5 Left Knee Extension: 2+/5 Left Ankle Dorsiflexion: 1/5 Left Ankle Plantar Flexion: 2-/5    Tawana Scale , PT, DPT, NCS, CSRS 11/03/2020, 6:42 PM

## 2020-11-03 NOTE — Progress Notes (Signed)
Speech Language Pathology Daily Session Note  Patient Details  Name: DAIZHA ALPHONSE MRN: [id number] Date of Birth: 05-Mar-1961  Today's Date: 11/03/2020 SLP Individual Time: [id number] SLP Individual Time Calculation (min): 44 min  Short Term Goals: Week 3: SLP Short Term Goal 1 (Week 3): Patient will utilize speech intelligibility strategies at the sentence level to maximize intelligibility and decrease shortness of breath with overall Sup A verbal cues. SLP Short Term Goal 2 (Week 3): Patient will demosntrate complex problem solving for functional tasks with supervision verbal cues. SLP Short Term Goal 3 (Week 3): Patient will self-monitor and correct erorrs during complex tasks with mod I. SLP Short Term Goal 4 (Week 3): Patient will utilize memory compensatory strategies to recall functional information with mod I  Skilled Therapeutic Interventions: Pt seen for skilled ST with focus on cognitive goals. Pt requesting to transfer to wheelchair from bed, SLP providing supervision level cues for safety. Pt completing medication management task with supervision level cues for 100% accuracy. Pt and SLP brainstorming and recording list of things that will need to be done at home including calling church to build ramp, organizing public transportation for MD appts (pt and spouse do not drive), etc. Pt demonstrates good anticipatory awareness for management of daily living tasks at home. Pt left in chair with alarm belt activated and all needs within reach. Cont ST POC.   Pain Pain Assessment Pain Scale: 0-10 Pain Score: 0-No pain  Therapy/Group: Individual Therapy  Dewaine Conger 11/03/2020, 11:14 AM

## 2020-11-04 LAB — RENAL FUNCTION PANEL
Albumin: 3.2 g/dL — ABNORMAL LOW (ref 3.5–5.0)
Anion gap: 10 (ref 5–15)
BUN: 9 mg/dL (ref 6–20)
CO2: 22 mmol/L (ref 22–32)
Calcium: 9.7 mg/dL (ref 8.9–10.3)
Chloride: 107 mmol/L (ref 98–111)
Creatinine, Ser: 2.18 mg/dL — ABNORMAL HIGH (ref 0.44–1.00)
GFR, Estimated: 25 mL/min — ABNORMAL LOW (ref >60)
Glucose, Bld: 155 mg/dL — ABNORMAL HIGH (ref 70–99)
Phosphorus: 4 mg/dL (ref 2.5–4.6)
Potassium: 3.1 mmol/L — ABNORMAL LOW (ref 3.5–5.1)
Sodium: 139 mmol/L (ref 135–145)

## 2020-11-04 LAB — GLUCOSE, CAPILLARY
Glucose-Capillary: 151 mg/dL — ABNORMAL HIGH (ref 70–99)
Glucose-Capillary: 137 mg/dL — ABNORMAL HIGH (ref 70–99)
Glucose-Capillary: 131 mg/dL — ABNORMAL HIGH (ref 70–99)
Glucose-Capillary: 137 mg/dL — ABNORMAL HIGH (ref 70–99)

## 2020-11-04 MED ORDER — POTASSIUM CHLORIDE CRYS ER 20 MEQ PO TBCR
40.0000 meq | EXTENDED_RELEASE_TABLET | Freq: Once | ORAL | Status: AC
  Administered 2020-11-04: 40 meq via ORAL
  Filled 2020-11-04: qty 2

## 2020-11-04 MED ORDER — FUROSEMIDE 10 MG/ML IJ SOLN
40.0000 mg | Freq: Once | INTRAMUSCULAR | Status: AC
  Administered 2020-11-04: 40 mg via INTRAVENOUS
  Filled 2020-11-04: qty 4

## 2020-11-04 MED ORDER — POTASSIUM CHLORIDE CRYS ER 10 MEQ PO TBCR
10.0000 meq | EXTENDED_RELEASE_TABLET | Freq: Every day | ORAL | Status: DC
  Administered 2020-11-05: 10 meq via ORAL
  Filled 2020-11-04: qty 1

## 2020-11-04 MED ORDER — POTASSIUM CHLORIDE CRYS ER 20 MEQ PO TBCR
30.0000 meq | EXTENDED_RELEASE_TABLET | Freq: Two times a day (BID) | ORAL | Status: DC
  Administered 2020-11-04: 30 meq via ORAL
  Filled 2020-11-04: qty 1

## 2020-11-04 NOTE — Progress Notes (Signed)
[facility] Progress Note  Name: Tracey Meyer MRN: [id number] DOB: [birth year]  Subjective:  strict ins/outs still not available. Urine 8 voids over 8/13.  complicated recent course and states for a time before her admission had been off of all/most meds.  She has been trying to push fluids and I asked her to scale back and we discussed her heart function  Review of systems:  Some shortness of breath with therapy or lying down flat Denies n/v No chest pain  ------- Background on consult:  Tracey Meyer is an 60 y.o. female with HTN, DM, HL, CAD s/p PCI 09/2020, CVAs 09/2020 who is seen by nephrology for evaluation and management of AKI.  Initially treated for NSTEMI with DES to occluded LAD 7/2 and discharged 7/4; course also notable for HTN 215/110.  Readmitted 7/7 with L weakness and slurred speech found to have multiple BL cerebral and cerebellar infarcts in imaging.  W/u concluded likely cardioembolic.  EF [id number].  She was transferred to CIR on 10/09/20.   She's making progress and plan was d/c 8/12 prior to AKI.   Cr was in the 0.8-1.2 range from 7/2 to 7/25.  On 8/1 it was 1.3, 8/10 1.9 > 8/10 1.9 > 1.9 > 8/11 2.2.  She was given a NS 538m bolus yesterday and rec'd 50/hr of 1/2 NS for about 4 hrs.  No iodinated contrast since 7/11.  Entresto 24/26 BID d/c'd after 8/9 PM dose. No fleets. No NSAIDs.  No UOP or weights have been recorded.  UA 7/8 neg for protein, looked like UTI, no culture done.   No renal imaging to date.  BC has been normal.   BPs have mainly been in the 130-140 range, a few in the past 3 nights in the 110s/60s.   This was asymptomatic. She feels fairly well - says po intake is ok, drinking fluids no difficulty.  Did have med induced loose stools in the past few days after constipation.  No LUTs, no hematuria or dysuria.  Reports normal volume of urine without straining to void.       Intake/Output Summary (Last 24 hours) at 11/04/2020 1420 Last data  filed at 11/04/2020 0846 Gross per 24 hour  Intake 1308 ml  Output --  Net 1308 ml    Vitals:  Vitals:   11/03/20 1258 11/03/20 1934 11/04/20 0500 11/04/20 0552  BP: (!) 158/89 (!) 146/80  130/62  Pulse: 62 68  62  Resp: '18 17  18  '$ Temp: 97.7 F (36.5 C) 98.3 F (36.8 C)  98.4 F (36.9 C)  TempSrc: Oral Oral  Oral  SpO2: 99% 100%  100%  Weight:   66.5 kg   Height:         Physical Exam:  General adult female in bed in no acute distress HEENT normocephalic atraumatic extraocular movements intact sclera anicteric Neck supple trachea midline Lungs clear to auscultation bilaterally normal work of breathing at rest on room air  Heart S1S2 no rub Abdomen soft nontender nondistended Extremities no edema  Psych normal mood and affect Neuro left arm weakness; alert and conversant; follows commands and provides hx  Medications reviewed   Labs:  BMP Latest Ref Rng & Units 11/04/2020 11/02/2020 11/01/2020  Glucose 70 - 99 mg/dL 155(H) 137(H) 117(H)  BUN 6 - 20 mg/dL '9 13 13  '$ Creatinine 0.44 - 1.00 mg/dL 2.18(H) 2.20(H) 2.21(H)  Sodium 135 - 145 mmol/L 139 140 139  Potassium 3.5 - 5.1 mmol/L  3.1(L) 3.5 3.7  Chloride 98 - 111 mmol/L 107 111 110  CO2 22 - 32 mmol/L 22 22 21(L)  Calcium 8.9 - 10.3 mg/dL 9.7 9.9 9.9     Assessment/Plan:   **AKI:  Suspect this was mild volume depletion + mild hypotension + ARB.  Renal US with no hydro and normal echogenicity.  Cr BL 1-1.2.  on 10/22/20 Cr was 1.27.  next check 10/29/20 Cr was 1.94.  UA with 30 mg/dl protein and 6-10 RBC; setting of UTI - Tracey Meyer has been D/C'd (last dose 8/9) - would keep off for now  - daily weights and strict ins/outs - UTI noted  - she appears to have plateaued.   ** UTI  - urine culture with klebsiella  - rocephin daily x 3 days   **HTN:  neurology rec goal BP 130-150 due to significant posterior circulation vascular stenosis.  Had some modest hypotension earlier this week and entresto is off now.     **CAD  s/p recent PCI: on DAPT, statin.   **CVA with L hemiparesis:  on CIR now for therapy - plans for discharge fairly soon.   **Heart failure with reduced EF - discussed to restrict fluid intake to 1.2 liters daily - lasix 40 mg IV once now  **Hypokalemia - 30 meq PO once earlier today (after lasix last night). Give another potassium tablet 40 meq once as giving more lasix. BMP in am and would plan on low dose potassium supplement - (I.e. 10 meq potassium daily) on discharge.   Dispo: she states discharge is now tentatively Monday.  Nephrology will sign off.  I will set up a hospital follow-up with [facility] in 2-3 weeks.  Please do not hesitate to contact us with questions.     Tracey Desanctis, MD 11/04/2020 2:43 PM

## 2020-11-04 NOTE — Progress Notes (Signed)
PROGRESS NOTE   Subjective/Complaints: Patient seen laying in bed this morning.  She states she slept well overnight.  She states she is looking forward to discharge tomorrow.  She was seen by nephrology yesterday, notes reviewed-Lasix ordered x1.  ROS: Denies CP, SOB, N/V/D  Objective:   No results found. No results for input(s): WBC, HGB, HCT, PLT in the last 72 hours.    Recent Labs    11/02/20 0655 11/04/20 0638  NA 140 139  K 3.5 3.1*  CL 111 107  CO2 22 22  GLUCOSE 137* 155*  BUN 13 9  CREATININE 2.20* 2.18*  CALCIUM 9.9 9.7       Intake/Output Summary (Last 24 hours) at 11/04/2020 0931 Last data filed at 11/04/2020 0600 Gross per 24 hour  Intake 1548 ml  Output --  Net 1548 ml         Physical Exam: Vital Signs Blood pressure 130/62, pulse 62, temperature 98.4 F (36.9 C), temperature source Oral, resp. rate 18, height '5\' 4"'$  (1.626 m), weight 66.5 kg, SpO2 100 %. Constitutional: No distress . Vital signs reviewed. HENT: Normocephalic.  Atraumatic. Eyes: EOMI. No discharge. Cardiovascular: No JVD.  RRR. Respiratory: Normal effort.  No stridor.  Bilateral clear to auscultation. GI: Non-distended.  BS +. Skin: Warm and dry.  Intact. Psych: Flat.  Normal behavior. Musc: No edema in extremities.  No tenderness in extremities. Neuro: Alert Motor: RUE/RLE: 5/5 proximal distal LUE: Shoulder abduction, elbow flexion/extension 2/5, distally 0/5, unchanged Left lower extremity: Flexion, knee extension 2+/5, ankle dorsiflexion 0/5  Assessment/Plan: 1. Functional deficits which require 3+ hours per day of interdisciplinary therapy in a comprehensive inpatient rehab setting. Physiatrist is providing close team supervision and 24 hour management of active medical problems listed below. Physiatrist and rehab team continue to assess barriers to discharge/monitor patient progress toward functional and medical  goals  Care Tool:  Bathing    Body parts bathed by patient: Right arm, Chest, Abdomen, Right upper leg, Left upper leg, Face, Left lower leg, Front perineal area, Buttocks, Right lower leg   Body parts bathed by helper: Left arm     Bathing assist Assist Level: Minimal Assistance - Patient > 75%     Upper Body Dressing/Undressing Upper body dressing   What is the patient wearing?: Pull over shirt    Upper body assist Assist Level: Supervision/Verbal cueing    Lower Body Dressing/Undressing Lower body dressing      What is the patient wearing?: Pants     Lower body assist Assist for lower body dressing: Minimal Assistance - Patient > 75%     Toileting Toileting    Toileting assist Assist for toileting: Minimal Assistance - Patient > 75%     Transfers Chair/bed transfer  Transfers assist     Chair/bed transfer assist level: Minimal Assistance - Patient > 75% (stand pivot) Chair/bed transfer assistive device: Armrests   Locomotion Ambulation   Ambulation assist      Assist level: Moderate Assistance - Patient 50 - 74% Assistive device: No Device Max distance: 10'   Walk 10 feet activity   Assist  Walk 10 feet activity did not occur: Safety/medical concerns  Assist level: Minimal Assistance - Patient > 75% Assistive device: Walker-rolling   Walk 50 feet activity   Assist Walk 50 feet with 2 turns activity did not occur: Safety/medical concerns  Assist level: Minimal Assistance - Patient > 75% Assistive device: Walker-rolling    Walk 150 feet activity   Assist Walk 150 feet activity did not occur: Safety/medical concerns         Walk 10 feet on uneven surface  activity   Assist Walk 10 feet on uneven surfaces activity did not occur: Safety/medical concerns   Assist level: Minimal Assistance - Patient > 75% Assistive device: Aeronautical engineer Will patient use wheelchair at discharge?: Yes Type of  Wheelchair: Manual    Wheelchair assist level: Set up assist, Supervision/Verbal cueing Max wheelchair distance: 164f    Wheelchair 50 feet with 2 turns activity    Assist        Assist Level: Supervision/Verbal cueing   Wheelchair 150 feet activity     Assist      Assist Level: Supervision/Verbal cueing   Blood pressure 130/62, pulse 62, temperature 98.4 F (36.9 C), temperature source Oral, resp. rate 18, height '5\' 4"'$  (1.626 m), weight 66.5 kg, SpO2 100 %.  Medical Problem List and Plan: 1.  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  Impaired mobility, ambulating >200 feet, d/c lovenox             -antiplatelet therapy: continue ASA/Brillinta 3. Pain Management: Tylenol prn.  4. Mood: LCSW to follow for evaluation and support.              -antipsychotic agents: N/A 5. Neuropsych: This patient is capable of making decisions on her own behalf. 6. Skin/Wound Care: Routine pressure relief measures.  7. Fluids/Electrolytes/Nutrition: Monitor I/Os. Heart healthy diet. 8.  NSTEMI s/p PCI/DES: On ASA/Brillinta 9. Chronic combined CHF: Monitor for signs of overload.  No peripheral edema, monitor after fluid bolus and KCL riders              --Continue toprol XL Spironolactone and Crestor.  Entresto on hold             --SBP goal 130-150 to prevent cerebral hypoperfusion. Filed Weights   11/02/20 0500 11/03/20 0426 11/04/20 0500  Weight: 65.3 kg 66.4 kg 66.5 kg   Stable on 8/14 10. T2DM: Hgb A1C-6.9. Monitor BS ac/hs. Educate on CM diet.Newly diagnosed CBG (last 3)  Recent Labs    11/03/20 1634 11/03/20 2056 11/04/20 0626  GLUCAP 181* 140* 151*   Elevated on 8/14, will require oral agent once renal function stabilizes -- Metformin bid (recently added) on hold 11. Anemia: Likely due to DAPT/Lovenox. Reported to have some pink vaginal discharge--vaginally v/s bladder source?              --monitor for signs of bleeding. Monitor serial H/H.  Hemoglobin 11.5 on 8/8 12. Constipation: Continue Senna and warm prune juice. Does not want to try anything stronger for now.  13. Hypokalemia:   Potassium 3.1 on 8/14, likely due to diuretic  Supplement x2 days 14. Intermittent SOB: Ordered incentive spirometer to improve breathing strength and ability over time. Provided incentive spirometer education. CXR obtained and stable   15.  AKI: appreciate nephro consult  Improving Keep Entresto off for now.  Creatinine 2.18 on 8/14 16. Hypotension: hold Entresto for  now as per nephroogy recs.   Labile on 8/14, monitor for trend 17. UA suggestive of UTI: pharmacy consulted regarding safest antibiotic given her renal function.  Urine culture >100,000 gram-negative rods, species and sensitivities pending Continue empiric IV Rocephin  LOS: 26 days A FACE TO FACE EVALUATION WAS PERFORMED  Taygen Acklin Lorie Phenix 11/04/2020, 9:31 AM

## 2020-11-05 ENCOUNTER — Other Ambulatory Visit (HOSPITAL_COMMUNITY): Payer: Self-pay

## 2020-11-05 LAB — URINE CULTURE: Culture: >=100000 — AB

## 2020-11-05 LAB — BASIC METABOLIC PANEL
Anion gap: 9 (ref 5–15)
BUN: 15 mg/dL (ref 6–20)
CO2: 23 mmol/L (ref 22–32)
Calcium: 9.7 mg/dL (ref 8.9–10.3)
Chloride: 107 mmol/L (ref 98–111)
Creatinine, Ser: 2.32 mg/dL — ABNORMAL HIGH (ref 0.44–1.00)
GFR, Estimated: 23 mL/min — ABNORMAL LOW (ref >60)
Glucose, Bld: 131 mg/dL — ABNORMAL HIGH (ref 70–99)
Potassium: 3.4 mmol/L — ABNORMAL LOW (ref 3.5–5.1)
Sodium: 139 mmol/L (ref 135–145)

## 2020-11-05 LAB — GLUCOSE, CAPILLARY
Glucose-Capillary: 135 mg/dL — ABNORMAL HIGH (ref 70–99)
Glucose-Capillary: 193 mg/dL — ABNORMAL HIGH (ref 70–99)

## 2020-11-05 LAB — CBC
HCT: 35.2 % — ABNORMAL LOW (ref 36.0–46.0)
Hemoglobin: 11.3 g/dL — ABNORMAL LOW (ref 12.0–15.0)
MCH: 29.6 pg (ref 26.0–34.0)
MCHC: 32.1 g/dL (ref 30.0–36.0)
MCV: 92.1 fL (ref 80.0–100.0)
Platelets: 296 10*3/uL (ref 150–400)
RBC: 3.82 MIL/uL — ABNORMAL LOW (ref 3.87–5.11)
RDW: 13.2 % (ref 11.5–15.5)
WBC: 5 10*3/uL (ref 4.0–10.5)
nRBC: 0 % (ref 0.0–0.2)

## 2020-11-05 MED ORDER — POTASSIUM CHLORIDE CRYS ER 20 MEQ PO TBCR: 20.0000 meq | EXTENDED_RELEASE_TABLET | Freq: Every day | ORAL | Status: DC

## 2020-11-05 MED ORDER — METOPROLOL SUCCINATE ER 25 MG PO TB24
12.5000 mg | ORAL_TABLET | Freq: Every day | ORAL | 0 refills | Status: DC
Start: 2020-11-05 — End: 2020-11-22
  Filled 2020-11-05: qty 15, 30d supply, fill #0

## 2020-11-05 MED ORDER — ROSUVASTATIN CALCIUM 40 MG PO TABS
40.0000 mg | ORAL_TABLET | Freq: Every day | ORAL | 0 refills | Status: DC
  Filled 2020-11-05: qty 30, 30d supply, fill #0

## 2020-11-05 MED ORDER — FAMOTIDINE 20 MG PO TABS
20.0000 mg | ORAL_TABLET | Freq: Every day | ORAL | 0 refills | Status: DC
  Filled 2020-11-05: qty 30, 30d supply, fill #0

## 2020-11-05 MED ORDER — POTASSIUM CHLORIDE CRYS ER 10 MEQ PO TBCR
10.0000 meq | EXTENDED_RELEASE_TABLET | Freq: Every day | ORAL | 0 refills | Status: DC
  Filled 2020-11-05: qty 30, 30d supply, fill #0

## 2020-11-05 MED ORDER — TIZANIDINE HCL 2 MG PO TABS
2.0000 mg | ORAL_TABLET | Freq: Three times a day (TID) | ORAL | 0 refills | Status: DC
Start: 2020-11-05 — End: 2020-11-22
  Filled 2020-11-05: qty 90, 30d supply, fill #0

## 2020-11-05 MED ORDER — NITROGLYCERIN 0.4 MG SL SUBL
0.4000 mg | SUBLINGUAL_TABLET | SUBLINGUAL | 0 refills | Status: DC | PRN
  Filled 2020-11-05: qty 25, 7d supply, fill #0

## 2020-11-05 MED ORDER — TICAGRELOR 90 MG PO TABS
90.0000 mg | ORAL_TABLET | Freq: Two times a day (BID) | ORAL | 0 refills | Status: DC
  Filled 2020-11-05: qty 60, 30d supply, fill #0

## 2020-11-05 NOTE — Discharge Summary (Signed)
Physician Discharge Summary  Patient ID: Tracey Meyer MRN: [id number] DOB/AGE: [birth year] 60 y.o.  Admit date: 10/09/2020 Discharge date: 11/05/2020  Discharge Diagnoses:  Principal Problem:   Embolic stroke [facility]) Active Problems:   AKI (acute kidney injury) ([place])   Acute lower UTI   Hypokalemia   Chronic combined systolic and diastolic congestive heart failure ([place])   Discharged Condition: good  Significant Diagnostic Studies: DG Chest 2 View  Result Date: 10/20/2020 CLINICAL DATA:  Recent history of stroke and heart attack. EXAM: CHEST - 2 VIEW COMPARISON:  September 27, 2020 FINDINGS: Stable cardiomegaly. The hila, mediastinum, lungs, and pleura are otherwise unremarkable. IMPRESSION: No active cardiopulmonary disease. Electronically Signed   By: Dorise Bullion III M.D   On: 10/20/2020 20:34   US RENAL  Result Date: 11/01/2020 CLINICAL DATA:  Acute kidney injury EXAM: RENAL / URINARY TRACT ULTRASOUND COMPLETE COMPARISON:  None. FINDINGS: Right Kidney: Renal measurements: 10 x 4.5 x 4.5 cm = volume: 106 mL. Echogenicity is within normal limits. No concerning renal mass, shadowing calculus or hydronephrosis. Left Kidney: Renal measurements: 11.2 x 6 x 4.9 cm = volume: 172 mL. Echogenicity is within normal limits. No concerning renal mass, shadowing calculus or hydronephrosis. Bladder: Appears normal for degree of bladder distention. Other: None. IMPRESSION: Unremarkable renal ultrasound. Electronically Signed   By: Lovena Le M.D.   On: 11/01/2020 21:43    Labs:  Basic Metabolic Panel: Recent Labs  Lab 10/31/20 0046 10/31/20 1440 11/01/20 0620 11/02/20 0655 11/04/20 0638 11/05/20 0653  NA 139 139 139 140 139 139  K 3.4* 3.9 3.7 3.5 3.1* 3.4*  CL 109 111 110 111 107 107  CO2 20* 20* 21* '22 22 23  '$ GLUCOSE 141* 121* 117* 137* 155* 131*  BUN '13 12 13 13 9 15  '$ CREATININE 1.90* 1.89* 2.21* 2.20* 2.18* 2.32*  CALCIUM 9.4 9.9 9.9 9.9 9.7 9.7  PHOS  --   --   --  4.0 4.0  --      CBC: CBC Latest Ref Rng & Units 11/05/2020 10/29/2020 10/22/2020  WBC 4.0 - 10.5 K/uL 5.0 5.6 5.8  Hemoglobin 12.0 - 15.0 g/dL 11.3(L) 11.5(L) 11.2(L)  Hematocrit 36.0 - 46.0 % 35.2(L) 34.3(L) 33.5(L)  Platelets 150 - 400 K/uL 296 242 302     CBG: Recent Labs  Lab 11/04/20 1137 11/04/20 1625 11/04/20 2129 11/05/20 0607 11/05/20 1145  GLUCAP 137* 131* 137* 135* 193*    Brief HPI:   Tracey Meyer is a 60 y.o. female with history of HTN, dyslipidemia, prediabetes but lack of medical care for years who was evaluated at [facility] on 09/23/22 for abdominal pain with nausea vomiting, diarrhea and diaphoresis.  She was noted to have malignant hypertension with BP 215/110 and EKG showed NSTEMI therefore she was transferred to [name] for emergent cardiac cath. She was found to have 100% occlusion of mid LAD which was treated to with PCI and DES X 1 by Dr. Ellyn Hack.  She was discharged home on 07/04 but readmitted on 07/07 with reports of left-sided weakness and slurred speech that began the evening of discharge and persisted.  She also had issues with increasing of shortness of breath with orthopnea and decreasing urine output therefore presented to ED for work-up. MRI brain done showing multiple scattered acute to early subacute infarcts involving bilateral cerebral and cerebellar hemisphere with largest infarct in right occipital lobe felt to be due to central thromboembolic etiology.    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  Dr. Carlis Abbott felt that left ICA was not the cause of stroke and to follow-up in 3 months for repeat carotid duplex.  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  Stroke felt to be cardioembolic with plans for [id number] event monitor to rule out A. fib after  discharge.  She continues on DAPT due to DES and medications adjusted to manage chronic combined CHF with shortness of breath.  Dr. Erlinda Hong recommended long-term BP goal of 130-150 significant posterior circulation stenosis.  Patient continued to be limited by left hemiplegia as well as cognitive deficits affecting ADLs and mobility.  CIR was recommended to functional decline   Hospital Course: Tracey Meyer was admitted to rehab 10/09/2020 for inpatient therapies to consist of PT, ST and OT at least three hours five days a week. Past admission physiatrist, therapy team and rehab RN have worked together to provide customized collaborative inpatient rehab. Her blood pressures were monitored on TID basis and Entresto was discontinued due to hypotension. She was noted to have some DOE and chest x-ray done which was negative for fluid overload or pulmonary edema. Dr. Shelda Altes was consulted for input due to worsening of renal status with rise in SCr to 2.2 despite hydration with IV fluids.  Renal ultrasound was done showing echogenicity within normal limits and no mass, calculus or hydronephrosis.  UA/UC was ordered for work-up and showed greater than 100,000 colonies Klebsiella pneumoniae.  She was treated with Rocephin X 3 days and encouraged to increase fluid intake.   She did report SOB with increase in fluid intake and was treated intermittently with IV lasix X 2 doses and resultant hypokalemia with intermittent supplementation therefore she was started on low-dose K-Lor 10 mEq at discharge she was advised to limit fluids to 1200 cc per day and is to follow up with nephrology on outpatient basis.  She is to continue on Lasix 40 mg a day with monitoring of daily weights and BP goal 130-150 range. Follow up CBC showed H&H and platelets to be relatively stable.  Her diabetes has been monitored with ac/hs CBG checks and SSI was use prn for tighter BS control.  Metformin was discontinued and blood sugars are  controlled on diet alone.  Constipation has been managed with use of senna and prune juice.  Her activity tolerance and endurance has improved.  She currently requires min assist at wheelchair level and will continue to receive follow-up home health PT, OT and ST after discharge   Rehab course: During patient's stay in rehab weekly team conferences were held to monitor patient's progress, set goals and discuss barriers to discharge. At admission, patient required mod assist with mobility and max assist with ADL tasks.  He scored 28 out of 30 points on SLUMS exam but was noted to have deficits in short-term memory problem-solving and awareness.  She also exhibited low and hoarse vocal quality.  She  has had improvement in activity tolerance, balance, postural control as well as ability to compensate for deficits.  She is able to complete ADL tasks with min assist. She requires supervision with contact-guard assistance for transfers and to ambulate 75 feet with rolling walker.  She requires min assist to navigate stairs.  She requires occasional supervision with cognitive tasks and is  able to utilize speech intelligibility strategies with occasional reminders.  Patient's partner plans on providing assistance/supervision after discharge.    Discharge disposition: 01-Home or Self Care  Diet: Heart healthy/carb modified.  Special Instructions: Maintain BP 130-152 range for adequate cerebral perfusion per neurology.\ Continue 1200 cc fluid restriction daily.  Discharge Instructions     Ambulatory referral to Neurology   Complete by: As directed    An appointment is requested in approximately: 4 weeks multiple cerebellar infarctions   Ambulatory referral to Physical Medicine Rehab   Complete by: As directed    Moderate complexity follow-up 1 to 2 weeks multiple cerebellar infarcts      Allergies as of 11/05/2020   No Known Allergies      Medication List     STOP taking these medications     metFORMIN 500 MG tablet Commonly known as: GLUCOPHAGE   sacubitril-valsartan 24-26 MG Commonly known as: ENTRESTO   VISINE DRY EYE OP       TAKE these medications    acetaminophen 325 MG tablet Commonly known as: TYLENOL Take 2 tablets (650 mg total) by mouth every 4 (four) hours as needed for headache or mild pain.   aspirin 81 MG EC tablet Take 1 tablet (81 mg total) by mouth daily. Swallow whole.   Brilinta 90 MG Tabs tablet Generic drug: ticagrelor Take 1 tablet (90 mg total) by mouth 2 (two) times daily.   calcium carbonate 500 MG chewable tablet Commonly known as: TUMS - dosed in mg elemental calcium Chew 2 tablets by mouth daily as needed for indigestion or heartburn.   famotidine 20 MG tablet Commonly known as: PEPCID Take 1 tablet (20 mg total) by mouth daily.   metoprolol succinate 25 MG 24 hr tablet Commonly known as: TOPROL-XL Take 0.5 tablets (12.5 mg total) by mouth daily. What changed: how much to take   nitroGLYCERIN 0.4 MG SL tablet Commonly known as: NITROSTAT Place 1 tablet (0.4 mg total) under the tongue every 5 (five) minutes x 3 doses as needed for chest pain.   potassium chloride 10 MEQ tablet Commonly known as: KLOR-CON Take 1 tablet (10 mEq total) by mouth daily.   rosuvastatin 40 MG tablet Commonly known as: CRESTOR Take 1 tablet (40 mg total) by mouth daily.   tiZANidine 2 MG tablet Commonly known as: ZANAFLEX Take 1 tablet (2 mg total) by mouth 3 (three) times daily.        Follow-up Information     Kirsteins, Luanna Salk, MD Follow up.   Specialty: Physical Medicine and Rehabilitation Why: Office to call for appointment Contact information: [place] [address] [phone]         Leonie Man, MD Follow up.   Specialty: Cardiology Why: Call for appointment Contact information: [address] [place] [address] [phone]         Marty Heck, MD  Follow up.   Specialty: Vascular Surgery Why: Call for plan to discuss carotid stenosis and plan of care Contact information: [place] [address] [phone]         Justin Mend, MD Follow up.   Specialty: Internal Medicine Why: Call for appointment as needed Contact information: [address] [place] [address] [phone]                 Signed: Bary Leriche 11/10/2020, 9:20 PM

## 2020-11-05 NOTE — Progress Notes (Signed)
Patient went home via ambulance transport this afternoon. Information packet given per PA, no questions noted. Taken down via stretcher. Angie Fava

## 2020-11-05 NOTE — Progress Notes (Signed)
PROGRESS NOTE   Subjective/Complaints:   Appreciate renal note, they have signed off with recs with f/u in 2-3 wks   ROS: Denies CP, SOB, N/V/D  Objective:   No results found. Recent Labs    11/05/20 0653  WBC 5.0  HGB 11.3*  HCT 35.2*  PLT 296      Recent Labs    11/04/20 0638 11/05/20 0653  NA 139 139  K 3.1* 3.4*  CL 107 107  CO2 22 23  GLUCOSE 155* 131*  BUN 9 15  CREATININE 2.18* 2.32*  CALCIUM 9.7 9.7       Intake/Output Summary (Last 24 hours) at 11/05/2020 0915 Last data filed at 11/05/2020 0837 Gross per 24 hour  Intake 560 ml  Output --  Net 560 ml         Physical Exam: Vital Signs Blood pressure 113/76, pulse 74, temperature 98.4 F (36.9 C), temperature source Oral, resp. rate 18, height '5\' 4"'$  (1.626 m), weight 64.7 kg, SpO2 99 %.  General: No acute distress Mood and affect are appropriate Heart: Regular rate and rhythm no rubs murmurs or extra sounds Lungs: Clear to auscultation, breathing unlabored, no rales or wheezes Abdomen: Positive bowel sounds, soft nontender to palpation, nondistended Extremities: No clubbing, cyanosis, or edema Skin: No evidence of breakdown, no evidence of rash   Motor: RUE/RLE: 5/5 proximal distal LUE: Shoulder abduction, elbow flexion/extension 2/5, distally 0/5, unchanged Left lower extremity: Flexion, knee extension 2+/5, ankle dorsiflexion 0/5  Assessment/Plan: 1. Functional deficits which require 3+ hours per day of interdisciplinary therapy in a comprehensive inpatient rehab setting. Physiatrist is providing close team supervision and 24 hour management of active medical problems listed below. Physiatrist and rehab team continue to assess barriers to discharge/monitor patient progress toward functional and medical goals  Care Tool:  Bathing    Body parts bathed by patient: Right arm, Chest, Abdomen, Right upper leg, Left upper leg, Face,  Left lower leg, Front perineal area, Buttocks, Right lower leg   Body parts bathed by helper: Left arm     Bathing assist Assist Level: Minimal Assistance - Patient > 75%     Upper Body Dressing/Undressing Upper body dressing   What is the patient wearing?: Pull over shirt    Upper body assist Assist Level: Supervision/Verbal cueing    Lower Body Dressing/Undressing Lower body dressing      What is the patient wearing?: Pants     Lower body assist Assist for lower body dressing: Minimal Assistance - Patient > 75%     Toileting Toileting    Toileting assist Assist for toileting: Minimal Assistance - Patient > 75%     Transfers Chair/bed transfer  Transfers assist     Chair/bed transfer assist level: Contact Guard/Touching assist Chair/bed transfer assistive device: Armrests   Locomotion Ambulation   Ambulation assist      Assist level: Contact Guard/Touching assist Assistive device: Walker-rolling Max distance: 47f   Walk 10 feet activity   Assist  Walk 10 feet activity did not occur: Safety/medical concerns  Assist level: Contact Guard/Touching assist Assistive device: Walker-rolling   Walk 50 feet activity   Assist Walk 50 feet with  2 turns activity did not occur: Safety/medical concerns  Assist level: Contact Guard/Touching assist Assistive device: Walker-rolling    Walk 150 feet activity   Assist Walk 150 feet activity did not occur: Safety/medical concerns (pulmonary endurance impairment)         Walk 10 feet on uneven surface  activity   Assist Walk 10 feet on uneven surfaces activity did not occur: Safety/medical concerns   Assist level: Minimal Assistance - Patient > 75% Assistive device: Aeronautical engineer Will patient use wheelchair at discharge?: Yes Type of Wheelchair: Manual    Wheelchair assist level: Set up assist Max wheelchair distance: 184f    Wheelchair 50 feet with 2 turns  activity    Assist        Assist Level: Supervision/Verbal cueing   Wheelchair 150 feet activity     Assist      Assist Level: Supervision/Verbal cueing   Blood pressure 113/76, pulse 74, temperature 98.4 F (36.9 C), temperature source Oral, resp. rate 18, height '5\' 4"'$  (1.626 m), weight 64.7 kg, SpO2 99 %.  Medical Problem List and Plan: 1.  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  Impaired mobility, ambulating >200 feet, d/c lovenox             -antiplatelet therapy: continue ASA/Brillinta 3. Pain Management: Tylenol prn.  4. Mood: LCSW to follow for evaluation and support.              -antipsychotic agents: N/A 5. Neuropsych: This patient is capable of making decisions on her own behalf. 6. Skin/Wound Care: Routine pressure relief measures.  7. Fluids/Electrolytes/Nutrition: Monitor I/Os. Heart healthy diet. 8.  NSTEMI s/p PCI/DES: On ASA/Brillinta 9. Chronic combined CHF: Monitor for signs of overload.  No peripheral edema, monitor after fluid bolus and KCL riders              --Continue toprol XL Spironolactone and Crestor.  Entresto on hold             --SBP goal 130-150 to prevent cerebral hypoperfusion. Filed Weights   11/03/20 0426 11/04/20 0500 11/05/20 0500  Weight: 66.4 kg 66.5 kg 64.7 kg   Stable on 8/15 10. T2DM: Hgb A1C-6.9. Monitor BS ac/hs. Educate on CM diet.Newly diagnosed CBG (last 3)  Recent Labs    11/04/20 1625 11/04/20 2129 11/05/20 0607  GLUCAP 131* 137* 135*   Controlled 8/15 -- Metformin bid (recently added) on hold 11. Anemia: Likely due to DAPT/Lovenox. Reported to have some pink vaginal discharge--vaginally v/s bladder source?             --monitor for signs of bleeding. Monitor serial H/H.  Hemoglobin 11.5 on 8/8 12. Constipation: Continue Senna and warm prune juice. Does not want to try anything stronger for now.  13. Hypokalemia:   Improving per  nephro cont 155m daily until nephro f/u 14. Intermittent SOB: Ordered incentive spirometer to improve breathing strength and ability over time. Provided incentive spirometer education. CXR obtained and stable   15.  AKI: appreciate nephro consult  Improving Keep Entresto off for now.  Creatinine 2.18 on 8/14, fairly stable at  16. Hypotension: hold Entresto for now as per nephroogy recs.   Labile on 8/14, monitor for trend 17. UA suggestive of UTI: pharmacy consulted regarding safest antibiotic given her renal function.  Urine culture >100,000 gram-negative rods, species  and sensitivities pending Continue empiric IV Rocephin, completed 3 d  LOS: 27 days A FACE TO [place] E Hilarie Sinha 11/05/2020, 9:15 AM

## 2020-11-05 NOTE — Progress Notes (Signed)
Patient ID: Tracey Meyer, female   DOB: 1960-11-30, 60 y.o.   MRN: [id number] Called and spoke with leonard-Boyfriend to make sure he will be at home. He assured this worker he is there waiting for her.

## 2020-11-09 ENCOUNTER — Other Ambulatory Visit (HOSPITAL_COMMUNITY): Payer: Self-pay

## 2020-11-09 ENCOUNTER — Telehealth (HOSPITAL_COMMUNITY): Payer: Self-pay

## 2020-11-09 NOTE — Telephone Encounter (Signed)
Pharmacy Transitions of Care Follow-up Telephone Call  Date of discharge: 11/05/20  Discharge Diagnosis: CVA  How have you been since you were released from the hospital?  Patient been well since discharge. No questions about meds at this time.  Medication changes made at discharge:      START taking: famotidine (PEPCID)  potassium chloride (KLOR-CON)  tiZANidine (ZANAFLEX)  CHANGE how you take: metoprolol succinate (TOPROL-XL)  STOP taking: metFORMIN 500 MG tablet (GLUCOPHAGE)  sacubitril-valsartan 24-26 MG (ENTRESTO)  VISINE DRY EYE OP   Medication changes verified by the patient? Yes    Medication Accessibility:  Home Pharmacy:  not discussed  Was the patient provided with refills on discharged medications? No   Have all prescriptions been transferred from [facility] to home pharmacy? N/A   Is the patient able to afford medications? No insurance, patient discharged on [facility] and will likely need patient assistance, currently waiting on medicaid approval.    Medication Review:  TICAGRELOR (BRILINTA) Ticagrelor 90 mg BID initiated on 11/05/20.  - Educated patient on expected duration of therapy of aspirin with ticagrelor.  - Discussed importance of taking medication around the same time every day, - Advised patient of medications to avoid (NSAIDs, aspirin maintenance doses>100 mg daily) - Educated that Tylenol (acetaminophen) will be the preferred analgesic to prevent risk of bleeding  - Emphasized importance of monitoring for signs and symptoms of bleeding (abnormal bruising, prolonged bleeding, nose bleeds, bleeding from gums, discolored urine, black tarry stools)  - Educated patient to notify doctor if shortness of breath or abnormal heartbeat occur - Advised patient to alert all providers of antiplatelet therapy prior to starting a new medication or having a procedure   Follow-up Appointments:  PCP Hospital f/u appt confirmed? Patient will talk to PCP about refilling  [facility] f/u appt confirmed? Scheduled to see Dr. Darden Dates on 12/18/20 @ Neurology.   If their condition worsens, is the pt aware to call PCP or go to the Emergency Dept.? Yes  Final Patient Assessment: Patient knows to get refills from PCP. Has additional follow up scheduled.

## 2020-11-20 ENCOUNTER — Encounter: Payer: Self-pay | Admitting: Registered Nurse

## 2020-11-20 ENCOUNTER — Inpatient Hospital Stay (HOSPITAL_COMMUNITY)
Admission: EM | Admit: 2020-11-20 | Discharge: 2020-11-22 | DRG: 291 | Disposition: A | Discharge status: Home / Self Care | Payer: Medicaid Other | Attending: Internal Medicine | Admitting: Internal Medicine

## 2020-11-20 ENCOUNTER — Other Ambulatory Visit (HOSPITAL_COMMUNITY): Payer: Self-pay

## 2020-11-20 ENCOUNTER — Other Ambulatory Visit: Payer: Self-pay

## 2020-11-20 ENCOUNTER — Emergency Department (HOSPITAL_COMMUNITY): Payer: Medicaid Other

## 2020-11-20 ENCOUNTER — Encounter: Payer: Medicaid Other | Attending: Registered Nurse | Admitting: Registered Nurse

## 2020-11-20 ENCOUNTER — Encounter (HOSPITAL_COMMUNITY): Payer: Self-pay

## 2020-11-20 VITALS — BP 188/120 | HR 103 | Temp 98.8°F | Ht 64.0 in

## 2020-11-20 DIAGNOSIS — R0602 Shortness of breath: Secondary | ICD-10-CM | POA: Diagnosis present

## 2020-11-20 DIAGNOSIS — I169 Hypertensive crisis, unspecified: Secondary | ICD-10-CM

## 2020-11-20 DIAGNOSIS — I255 Ischemic cardiomyopathy: Secondary | ICD-10-CM | POA: Diagnosis present

## 2020-11-20 DIAGNOSIS — I130 Hypertensive heart and chronic kidney disease with heart failure and stage 1 through stage 4 chronic kidney disease, or unspecified chronic kidney disease: Principal | ICD-10-CM | POA: Diagnosis present

## 2020-11-20 DIAGNOSIS — Z8673 Personal history of transient ischemic attack (TIA), and cerebral infarction without residual deficits: Secondary | ICD-10-CM | POA: Diagnosis not present

## 2020-11-20 DIAGNOSIS — Z8249 Family history of ischemic heart disease and other diseases of the circulatory system: Secondary | ICD-10-CM | POA: Diagnosis not present

## 2020-11-20 DIAGNOSIS — Z9119 Patient's noncompliance with other medical treatment and regimen: Secondary | ICD-10-CM | POA: Diagnosis not present

## 2020-11-20 DIAGNOSIS — Z832 Family history of diseases of the blood and blood-forming organs and certain disorders involving the immune mechanism: Secondary | ICD-10-CM | POA: Diagnosis not present

## 2020-11-20 DIAGNOSIS — T502X5A Adverse effect of carbonic-anhydrase inhibitors, benzothiadiazides and other diuretics, initial encounter: Secondary | ICD-10-CM | POA: Diagnosis not present

## 2020-11-20 DIAGNOSIS — R0600 Dyspnea, unspecified: Secondary | ICD-10-CM | POA: Insufficient documentation

## 2020-11-20 DIAGNOSIS — Z955 Presence of coronary angioplasty implant and graft: Secondary | ICD-10-CM

## 2020-11-20 DIAGNOSIS — Z7982 Long term (current) use of aspirin: Secondary | ICD-10-CM | POA: Diagnosis not present

## 2020-11-20 DIAGNOSIS — N1832 Chronic kidney disease, stage 3b: Secondary | ICD-10-CM | POA: Diagnosis present

## 2020-11-20 DIAGNOSIS — R7989 Other specified abnormal findings of blood chemistry: Secondary | ICD-10-CM

## 2020-11-20 DIAGNOSIS — I509 Heart failure, unspecified: Secondary | ICD-10-CM

## 2020-11-20 DIAGNOSIS — E1122 Type 2 diabetes mellitus with diabetic chronic kidney disease: Secondary | ICD-10-CM | POA: Diagnosis present

## 2020-11-20 DIAGNOSIS — I2510 Atherosclerotic heart disease of native coronary artery without angina pectoris: Secondary | ICD-10-CM | POA: Diagnosis present

## 2020-11-20 DIAGNOSIS — E785 Hyperlipidemia, unspecified: Secondary | ICD-10-CM | POA: Diagnosis present

## 2020-11-20 DIAGNOSIS — Z87891 Personal history of nicotine dependence: Secondary | ICD-10-CM

## 2020-11-20 DIAGNOSIS — I5043 Acute on chronic combined systolic (congestive) and diastolic (congestive) heart failure: Secondary | ICD-10-CM | POA: Diagnosis present

## 2020-11-20 DIAGNOSIS — E119 Type 2 diabetes mellitus without complications: Secondary | ICD-10-CM

## 2020-11-20 DIAGNOSIS — Z20822 Contact with and (suspected) exposure to covid-19: Secondary | ICD-10-CM | POA: Diagnosis present

## 2020-11-20 DIAGNOSIS — E876 Hypokalemia: Secondary | ICD-10-CM | POA: Diagnosis not present

## 2020-11-20 DIAGNOSIS — I160 Hypertensive urgency: Secondary | ICD-10-CM | POA: Diagnosis present

## 2020-11-20 DIAGNOSIS — I10 Essential (primary) hypertension: Secondary | ICD-10-CM | POA: Diagnosis present

## 2020-11-20 DIAGNOSIS — I252 Old myocardial infarction: Secondary | ICD-10-CM | POA: Diagnosis not present

## 2020-11-20 DIAGNOSIS — I2102 ST elevation (STEMI) myocardial infarction involving left anterior descending coronary artery: Secondary | ICD-10-CM | POA: Insufficient documentation

## 2020-11-20 DIAGNOSIS — R109 Unspecified abdominal pain: Secondary | ICD-10-CM

## 2020-11-20 DIAGNOSIS — Z809 Family history of malignant neoplasm, unspecified: Secondary | ICD-10-CM

## 2020-11-20 DIAGNOSIS — I161 Hypertensive emergency: Secondary | ICD-10-CM | POA: Diagnosis present

## 2020-11-20 DIAGNOSIS — J9601 Acute respiratory failure with hypoxia: Secondary | ICD-10-CM | POA: Diagnosis present

## 2020-11-20 DIAGNOSIS — I5042 Chronic combined systolic (congestive) and diastolic (congestive) heart failure: Secondary | ICD-10-CM | POA: Insufficient documentation

## 2020-11-20 DIAGNOSIS — Z7902 Long term (current) use of antithrombotics/antiplatelets: Secondary | ICD-10-CM | POA: Diagnosis not present

## 2020-11-20 DIAGNOSIS — R778 Other specified abnormalities of plasma proteins: Secondary | ICD-10-CM

## 2020-11-20 DIAGNOSIS — Z79899 Other long term (current) drug therapy: Secondary | ICD-10-CM | POA: Diagnosis not present

## 2020-11-20 DIAGNOSIS — R0609 Other forms of dyspnea: Secondary | ICD-10-CM

## 2020-11-20 HISTORY — DX: Heart failure, unspecified: I50.9

## 2020-11-20 LAB — CBC WITH DIFFERENTIAL/PLATELET
Abs Immature Granulocytes: 0.02 10*3/uL (ref 0.00–0.07)
Basophils Absolute: 0 10*3/uL (ref 0.0–0.1)
Basophils Relative: 1 %
Eosinophils Absolute: 0.1 10*3/uL (ref 0.0–0.5)
Eosinophils Relative: 2 %
HCT: 32.8 % — ABNORMAL LOW (ref 36.0–46.0)
Hemoglobin: 10.5 g/dL — ABNORMAL LOW (ref 12.0–15.0)
Immature Granulocytes: 0 %
Lymphocytes Relative: 28 %
Lymphs Abs: 1.5 10*3/uL (ref 0.7–4.0)
MCH: 29 pg (ref 26.0–34.0)
MCHC: 32 g/dL (ref 30.0–36.0)
MCV: 90.6 fL (ref 80.0–100.0)
Monocytes Absolute: 0.4 10*3/uL (ref 0.1–1.0)
Monocytes Relative: 7 %
Neutro Abs: 3.4 10*3/uL (ref 1.7–7.7)
Neutrophils Relative %: 62 %
Platelets: 443 10*3/uL — ABNORMAL HIGH (ref 150–400)
RBC: 3.62 MIL/uL — ABNORMAL LOW (ref 3.87–5.11)
RDW: 13.7 % (ref 11.5–15.5)
WBC: 5.4 10*3/uL (ref 4.0–10.5)
nRBC: 0 % (ref 0.0–0.2)

## 2020-11-20 LAB — RAPID URINE DRUG SCREEN, HOSP PERFORMED
Amphetamines: NOT DETECTED
Barbiturates: NOT DETECTED
Benzodiazepines: NOT DETECTED
Cocaine: NOT DETECTED
Opiates: NOT DETECTED
Tetrahydrocannabinol: NOT DETECTED

## 2020-11-20 LAB — BASIC METABOLIC PANEL
Anion gap: 9 (ref 5–15)
BUN: 14 mg/dL (ref 6–20)
CO2: 21 mmol/L — ABNORMAL LOW (ref 22–32)
Calcium: 9.8 mg/dL (ref 8.9–10.3)
Chloride: 109 mmol/L (ref 98–111)
Creatinine, Ser: 1.06 mg/dL — ABNORMAL HIGH (ref 0.44–1.00)
GFR, Estimated: >60 mL/min (ref >60)
Glucose, Bld: 130 mg/dL — ABNORMAL HIGH (ref 70–99)
Potassium: 3.8 mmol/L (ref 3.5–5.1)
Sodium: 139 mmol/L (ref 135–145)

## 2020-11-20 LAB — TROPONIN I (HIGH SENSITIVITY)
Troponin I (High Sensitivity): 92 ng/L — ABNORMAL HIGH (ref <18)
Troponin I (High Sensitivity): 96 ng/L — ABNORMAL HIGH (ref <18)

## 2020-11-20 LAB — BRAIN NATRIURETIC PEPTIDE: B Natriuretic Peptide: 1581 pg/mL — ABNORMAL HIGH (ref 0.0–100.0)

## 2020-11-20 LAB — PROTIME-INR
INR: 1 (ref 0.8–1.2)
Prothrombin Time: 13.6 seconds (ref 11.4–15.2)

## 2020-11-20 LAB — MAGNESIUM: Magnesium: 2.1 mg/dL (ref 1.7–2.4)

## 2020-11-20 LAB — CBG MONITORING, ED: Glucose-Capillary: 107 mg/dL — ABNORMAL HIGH (ref 70–99)

## 2020-11-20 LAB — GLUCOSE, CAPILLARY: Glucose-Capillary: 122 mg/dL — ABNORMAL HIGH (ref 70–99)

## 2020-11-20 LAB — TSH: TSH: 0.978 u[IU]/mL (ref 0.350–4.500)

## 2020-11-20 IMAGING — DX DG CHEST 1V PORT
1 series · 1 of 1 positions shown · non-contrast
Comparison: Chest radiograph [DATE]

CLINICAL DATA: Shortness of breath

EXAM:
PORTABLE CHEST 1 VIEW

[chest]
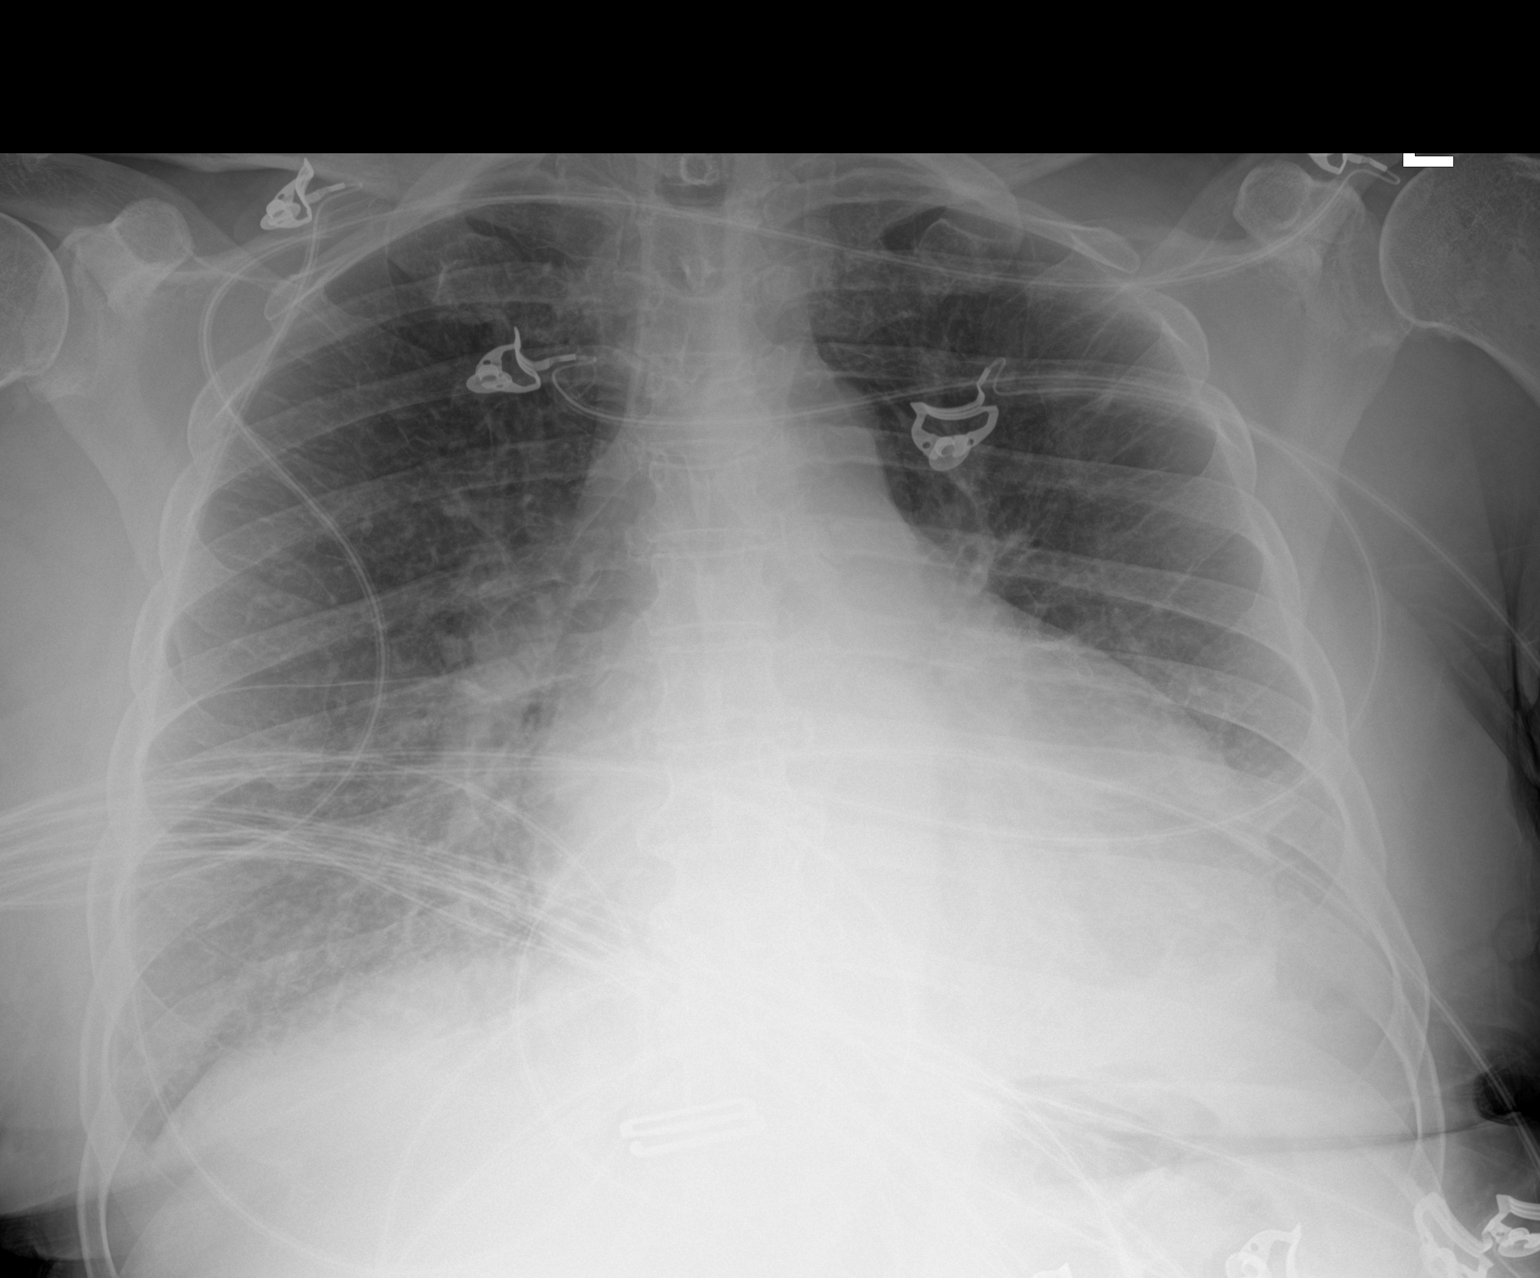

[1 of 1 positions shown; findings below may reference images not displayed]

FINDINGS: The heart is mildly enlarged, unchanged. The mediastinal contours
are stable.

There are increased interstitial markings bilaterally. There are
trace bilateral pleural effusions with patchy adjacent airspace
disease likely reflecting subsegmental atelectasis. There is no
other focal consolidation. There is no pneumothorax.

There is no acute osseous abnormality.
IMPRESSION: Cardiomegaly with mild pulmonary interstitial edema and trace
bilateral pleural effusions suggesting heart failure.

## 2020-11-20 MED ORDER — NITROGLYCERIN 0.4 MG SL SUBL
0.4000 mg | SUBLINGUAL_TABLET | SUBLINGUAL | Status: AC | PRN
Start: 2020-11-20 — End: 2020-11-20
  Administered 2020-11-20 (×3): 0.4 mg via SUBLINGUAL
  Filled 2020-11-20: qty 1

## 2020-11-20 MED ORDER — FUROSEMIDE 10 MG/ML IJ SOLN
40.0000 mg | Freq: Once | INTRAMUSCULAR | Status: AC
  Administered 2020-11-20: 40 mg via INTRAVENOUS
  Filled 2020-11-20: qty 4

## 2020-11-20 MED ORDER — SODIUM CHLORIDE 0.9% FLUSH: 3.0000 mL | INTRAVENOUS | Status: DC | PRN

## 2020-11-20 MED ORDER — ACETAMINOPHEN 325 MG PO TABS: 650.0000 mg | ORAL_TABLET | ORAL | Status: DC | PRN

## 2020-11-20 MED ORDER — TICAGRELOR 90 MG PO TABS
90.0000 mg | ORAL_TABLET | Freq: Two times a day (BID) | ORAL | Status: DC
  Administered 2020-11-20 – 2020-11-22 (×4): 90 mg via ORAL
  Filled 2020-11-20 (×4): qty 1

## 2020-11-20 MED ORDER — TIZANIDINE HCL 4 MG PO TABS
2.0000 mg | ORAL_TABLET | Freq: Three times a day (TID) | ORAL | Status: DC
  Administered 2020-11-20 – 2020-11-22 (×6): 2 mg via ORAL
  Filled 2020-11-20 (×5): qty 1

## 2020-11-20 MED ORDER — ONDANSETRON HCL 4 MG/2ML IJ SOLN: 4.0000 mg | Freq: Four times a day (QID) | INTRAMUSCULAR | Status: DC | PRN

## 2020-11-20 MED ORDER — LISINOPRIL 10 MG PO TABS
10.0000 mg | ORAL_TABLET | Freq: Every day | ORAL | Status: DC
  Administered 2020-11-20 – 2020-11-22 (×3): 10 mg via ORAL
  Filled 2020-11-20 (×3): qty 1

## 2020-11-20 MED ORDER — SODIUM CHLORIDE 0.9% FLUSH
3.0000 mL | Freq: Two times a day (BID) | INTRAVENOUS | Status: DC
  Administered 2020-11-21 – 2020-11-22 (×3): 3 mL via INTRAVENOUS

## 2020-11-20 MED ORDER — CARVEDILOL 3.125 MG PO TABS
3.1250 mg | ORAL_TABLET | Freq: Two times a day (BID) | ORAL | Status: DC
  Administered 2020-11-20 – 2020-11-22 (×4): 3.125 mg via ORAL
  Filled 2020-11-20 (×4): qty 1

## 2020-11-20 MED ORDER — ENOXAPARIN SODIUM 40 MG/0.4ML IJ SOSY
40.0000 mg | PREFILLED_SYRINGE | INTRAMUSCULAR | Status: DC
  Administered 2020-11-20 – 2020-11-21 (×2): 40 mg via SUBCUTANEOUS
  Filled 2020-11-20 (×2): qty 0.4

## 2020-11-20 MED ORDER — NITROGLYCERIN IN D5W 200-5 MCG/ML-% IV SOLN
0.0000 ug/min | INTRAVENOUS | Status: DC
  Administered 2020-11-20: 5 ug/min via INTRAVENOUS
  Filled 2020-11-20: qty 250

## 2020-11-20 MED ORDER — INSULIN ASPART 100 UNIT/ML IJ SOLN
0.0000 [IU] | Freq: Three times a day (TID) | INTRAMUSCULAR | Status: DC
  Administered 2020-11-21 (×2): 2 [IU] via SUBCUTANEOUS
  Administered 2020-11-21: 3 [IU] via SUBCUTANEOUS

## 2020-11-20 MED ORDER — ROSUVASTATIN CALCIUM 20 MG PO TABS
40.0000 mg | ORAL_TABLET | Freq: Every day | ORAL | Status: DC
  Administered 2020-11-20 – 2020-11-22 (×3): 40 mg via ORAL
  Filled 2020-11-20 (×3): qty 2

## 2020-11-20 MED ORDER — FUROSEMIDE 10 MG/ML IJ SOLN
40.0000 mg | Freq: Two times a day (BID) | INTRAMUSCULAR | Status: DC
  Administered 2020-11-21 (×2): 40 mg via INTRAVENOUS
  Filled 2020-11-20 (×2): qty 4

## 2020-11-20 MED ORDER — ASPIRIN EC 81 MG PO TBEC
81.0000 mg | DELAYED_RELEASE_TABLET | Freq: Every day | ORAL | Status: DC
  Administered 2020-11-20 – 2020-11-22 (×3): 81 mg via ORAL
  Filled 2020-11-20 (×3): qty 1

## 2020-11-20 MED ORDER — SODIUM CHLORIDE 0.9 % IV SOLN: 250.0000 mL | INTRAVENOUS | Status: DC | PRN

## 2020-11-20 NOTE — H&P (Signed)
History and Physical    MAICY CHEADLE [id number] DOB: 23-Jul-1960 DOA: 11/20/2020  PCP: Pcp, No (Confirm with patient/family/NH records and if not entered, this has to be entered at [facility] point of entry) Patient coming from: Home  I have personally briefly reviewed patient's old medical records in [place]  Chief Complaint: SOB  HPI: Tracey Meyer is a 60 y.o. female with medical history significant of chronic combined systolic and diastolic CHF with LVEF 30 to 35% July 2022, HTN, CAD with NSTEMI status post LAD stenting times 1 July [id number], recent embolic stroke, HLD, presented with new onset of shortness of breath.  Patient had prolonged hospitalization July to August, for treatment of new onset of CHF, embolic stroke, CAD/non-STEMI status post LAD stenting.  During last hospitalization, it appears that patient has had fluctuation of blood pressure, and her BP meds reduced to metoprolol 12.5 mg daily.  And patient has been compliant with her BP meds.  This morning, patient started to feel shortness of breath, minimum activity triggered extreme shortness of breath, and patient went to see her physical therapy doctor in the morning, who checked her BP systolic> [id number], recheck SBP 188, and physical therapy sent her to ED.  ED Course: Significant fluid overload, x-ray showed pulmonary edema, hypoxia and needed 3 L oxygen and symptomatic improvement.  BP> 200, not responding to initial nitroglycerin, nitroglycerin drip started.  Review of Systems: As per HPI otherwise 14 point review of systems negative.    Past Medical History:  Diagnosis Date   Cardiomyopathy, ischemic 09/24/2020   DM (diabetes mellitus), type 2 ([place]) 09/24/2020   HTN (hypertension)    Hyperlipidemia    Prediabetes    S/P angioplasty with stent to mLAD 09/22/20  09/24/2020   Tobacco abuse 09/24/2020   Transaminitis 09/24/2020    Past Surgical History:  Procedure Laterality Date   BUBBLE STUDY  10/02/2020    Procedure: BUBBLE STUDY;  Surgeon: Lelon Perla, MD;  Location: [facility] ENDOSCOPY;  Service: Cardiovascular;;   CORONARY/GRAFT ACUTE MI REVASCULARIZATION N/A 09/22/2020   Procedure: Coronary/Graft Acute MI Revascularization;  Surgeon: Martinique, Peter M, MD;  Location: [place] CV LAB;  Service: Cardiovascular;  Laterality: N/A;   LEFT HEART CATH AND CORONARY ANGIOGRAPHY N/A 09/22/2020   Procedure: LEFT HEART CATH AND CORONARY ANGIOGRAPHY;  Surgeon: Martinique, Peter M, MD;  Location: [place] CV LAB;  Service: Cardiovascular;  Laterality: N/A;   mastectomy Left    at age 85 due to concerns of cancer   TEE WITHOUT CARDIOVERSION N/A 10/02/2020   Procedure: TRANSESOPHAGEAL ECHOCARDIOGRAM (TEE);  Surgeon: Lelon Perla, MD;  Location: [facility] ENDOSCOPY;  Service: Cardiovascular;  Laterality: N/A;     reports that she has quit smoking. Her smoking use included cigarettes. She smoked an average of .5 packs per day. She has never used smokeless tobacco. She reports that she does not currently use alcohol. She reports that she does not use drugs.  No Known Allergies  Family History  Problem Relation Age of Onset   Cancer Mother    Lupus Sister    Heart disease Brother    Cancer Maternal Aunt      Prior to Admission medications   Medication Sig Start Date End Date Taking? Authorizing Provider  acetaminophen (TYLENOL) 325 MG tablet Take 2 tablets (650 mg total) by mouth every 4 (four) hours as needed for headache or mild pain. 09/24/20  Yes Isaiah Serge, NP  aspirin EC 81 MG EC tablet  Take 1 tablet (81 mg total) by mouth daily. Swallow whole. 09/25/20  Yes Isaiah Serge, NP  metoprolol succinate (TOPROL-XL) 25 MG 24 hr tablet Take 0.5 tablets (12.5 mg total) by mouth daily. Patient taking differently: Take 25 mg by mouth daily. 11/05/20  Yes Angiulli, Lavon Paganini, PA-C  nitroGLYCERIN (NITROSTAT) 0.4 MG SL tablet Place 1 tablet (0.4 mg total) under the tongue every 5 (five) minutes x 3 doses as  needed for chest pain. 11/05/20  Yes Angiulli, Lavon Paganini, PA-C  ticagrelor (BRILINTA) 90 MG TABS tablet Take 1 tablet (90 mg total) by mouth 2 (two) times daily. 11/05/20  Yes Angiulli, Lavon Paganini, PA-C  tiZANidine (ZANAFLEX) 2 MG tablet Take 1 tablet (2 mg total) by mouth 3 (three) times daily. 11/05/20  Yes Angiulli, Lavon Paganini, PA-C  famotidine (PEPCID) 20 MG tablet Take 1 tablet (20 mg total) by mouth daily. Patient not taking: No sig reported 11/05/20   Angiulli, Lavon Paganini, PA-C  potassium chloride (KLOR-CON) 10 MEQ tablet Take 1 tablet (10 mEq total) by mouth daily. Patient not taking: No sig reported 11/06/20   Angiulli, Lavon Paganini, PA-C  rosuvastatin (CRESTOR) 40 MG tablet Take 1 tablet (40 mg total) by mouth daily. Patient not taking: No sig reported 11/05/20 12/05/20  Cathlyn Parsons, PA-C    Physical Exam: Vitals:   11/20/20 1422 11/20/20 1424 11/20/20 1430 11/20/20 1500  BP:   (!) 186/113 (!) 166/110  Pulse:   92 91  Resp:   (!) 21 (!) 35  Temp:      TempSrc:      SpO2: (!) 86% 94% 97% 98%  Weight:      Height:        Constitutional: NAD, calm, comfortable Vitals:   11/20/20 1422 11/20/20 1424 11/20/20 1430 11/20/20 1500  BP:   (!) 186/113 (!) 166/110  Pulse:   92 91  Resp:   (!) 21 (!) 35  Temp:      TempSrc:      SpO2: (!) 86% 94% 97% 98%  Weight:      Height:       Eyes: PERRL, lids and conjunctivae normal ENMT: Mucous membranes are moist. Posterior pharynx clear of any exudate or lesions.Normal dentition.  Neck: normal, supple, no masses, no thyromegaly Respiratory: clear to auscultation bilaterally, no wheezing, fine crackles to B/L mid-fields. Increasing respiratory effort. Talking in broken sentences, no accessory muscle use.  Cardiovascular: Regular rate and rhythm, no murmurs / rubs / gallops. No extremity edema. 2+ pedal pulses. No carotid bruits.  Abdomen: no tenderness, no masses palpated. No hepatosplenomegaly. Bowel sounds positive.  Musculoskeletal: no  clubbing / cyanosis. No joint deformity upper and lower extremities. Good ROM, no contractures. Normal muscle tone.  Skin: no rashes, lesions, ulcers. No induration Neurologic: CN 2-12 grossly intact. Sensation intact, DTR normal. Strength 5/5 in all 4.  Psychiatric: Normal judgment and insight. Alert and oriented x 3. Normal mood.     Labs on Admission: I have personally reviewed following labs and imaging studies  CBC: Recent Labs  Lab 11/20/20 1314  WBC 5.4  NEUTROABS 3.4  HGB 10.5*  HCT 32.8*  MCV 90.6  PLT 123456*   Basic Metabolic Panel: Recent Labs  Lab 11/20/20 1314  NA 139  K 3.8  CL 109  CO2 21*  GLUCOSE 130*  BUN 14  CREATININE 1.06*  CALCIUM 9.8  MG 2.1   GFR: Estimated Creatinine Clearance: 48.7 mL/min (A) (by C-G formula based on  SCr of 1.06 mg/dL (H)). Liver Function Tests: No results for input(s): AST, ALT, ALKPHOS, BILITOT, PROT, ALBUMIN in the last 168 hours. No results for input(s): LIPASE, AMYLASE in the last 168 hours. No results for input(s): AMMONIA in the last 168 hours. Coagulation Profile: Recent Labs  Lab 11/20/20 1314  INR 1.0   Cardiac Enzymes: No results for input(s): CKTOTAL, CKMB, CKMBINDEX, TROPONINI in the last 168 hours. BNP (last 3 results) No results for input(s): PROBNP in the last 8760 hours. HbA1C: No results for input(s): HGBA1C in the last 72 hours. CBG: No results for input(s): GLUCAP in the last 168 hours. Lipid Profile: No results for input(s): CHOL, HDL, LDLCALC, TRIG, CHOLHDL, LDLDIRECT in the last 72 hours. Thyroid Function Tests: No results for input(s): TSH, T4TOTAL, FREET4, T3FREE, THYROIDAB in the last 72 hours. Anemia Panel: No results for input(s): VITAMINB12, FOLATE, FERRITIN, TIBC, IRON, RETICCTPCT in the last 72 hours. Urine analysis:    Component Value Date/Time   COLORURINE YELLOW 11/01/2020 1557   APPEARANCEUR CLOUDY (A) 11/01/2020 1557   LABSPEC 1.004 (L) 11/01/2020 1557   PHURINE 5.0  11/01/2020 1557   GLUCOSEU NEGATIVE 11/01/2020 1557   HGBUR MODERATE (A) 11/01/2020 1557   BILIRUBINUR NEGATIVE 11/01/2020 [place] 11/01/2020 1557   PROTEINUR 30 (A) 11/01/2020 1557   NITRITE NEGATIVE 11/01/2020 1557   LEUKOCYTESUR LARGE (A) 11/01/2020 1557    Radiological Exams on Admission: DG Chest Portable 1 View  Result Date: 11/20/2020 CLINICAL DATA:  Shortness of breath EXAM: PORTABLE CHEST 1 VIEW COMPARISON:  Chest radiograph 10/20/2020 FINDINGS: The heart is mildly enlarged, unchanged. The mediastinal contours are stable. There are increased interstitial markings bilaterally. There are trace bilateral pleural effusions with patchy adjacent airspace disease likely reflecting subsegmental atelectasis. There is no other focal consolidation. There is no pneumothorax. There is no acute osseous abnormality. IMPRESSION: Cardiomegaly with mild pulmonary interstitial edema and trace bilateral pleural effusions suggesting heart failure. Electronically Signed   By: Valetta Mole M.D.   On: 11/20/2020 14:47    EKG: Independently reviewed.  Sinus, LVH  Assessment/Plan Active Problems:   Chronic combined systolic and diastolic congestive heart failure (HCC)   CHF (congestive heart failure) ([place])  (please populate well all problems here in Problem List. (For example, if patient is on BP meds at home and you resume or decide to hold them, it is a problem that needs to be her. Same for CAD, COPD, HLD and so on)  Acute on chronic systolic and diastolic CHF decompensation -Likely secondary to hypertension emergency -Agreed with aggressive control of blood pressure with nitroglycerin drip. -Start Coreg -Start low-dose ACEI, titrate according to response, monitor blood pressure response as patient has history of intolerance to [place]. -Continue Lasix 40 mg twice daily IV.  Expect patient discharged on as needed Lasix. -Echo was done last month, will not repeat this time.  HTN  emergency -Symptoms and signs of endorgan damage of CHF decompensation.  Treatment as above.  CAD, recent NSTEMI LAD stenting -Troponin slightly elevated likely secondary to CHF decompensation, treatment as above.  Recent embolic stroke -Continue aspirin and Brilinta.  IIDM -A1C 6.8, sliding scale for now, consider outpatient SGLT 2  DVT prophylaxis: Lovenox Code Status: Full code Family Communication: None at bedside Disposition Plan: Expect hospital stay with CHF decompensation and BP regimen Consults called: None Admission status: PCU   Lequita Halt MD [facility] Pager [phone]  11/20/2020, 4:08 PM

## 2020-11-20 NOTE — ED Provider Notes (Addendum)
[facility] EMERGENCY DEPARTMENT Provider Note   CSN: [id number] Arrival date & time: 11/20/20  1248     History Chief Complaint  Patient presents with   Shortness of Breath   Hypertension    Tracey Meyer is a 60 y.o. female.  Presents to ER for shortness of breath.  Feeling somewhat short of breath over the past day or so.  Currently denies any chest pain.  Also states that her blood pressure was elevated today.  No cough or fevers recently.    Recently admitted for stroke.  Earlier in July patient had MI and successful PCI to LAD with DES.  HPI     Past Medical History:  Diagnosis Date   Cardiomyopathy, ischemic 09/24/2020   DM (diabetes mellitus), type 2 ([place]) 09/24/2020   HTN (hypertension)    Hyperlipidemia    Prediabetes    S/P angioplasty with stent to mLAD 09/22/20  09/24/2020   Tobacco abuse 09/24/2020   Transaminitis 09/24/2020    Patient Active Problem List   Diagnosis Date Noted   AKI (acute kidney injury) ([place])    Acute lower UTI    Hypokalemia    Chronic combined systolic and diastolic congestive heart failure (HCC)    Embolic stroke ([place]) [id number]   CKD (chronic kidney disease), stage III ([place]) 09/29/2020   Left-sided weakness    Acute ischemic stroke ([place]) 09/27/2020   HFrEF (heart failure with reduced ejection fraction) ([place]) 09/27/2020   Tobacco abuse 09/24/2020   Cardiomyopathy, ischemic 09/24/2020   S/P angioplasty with stent to mLAD 09/22/20  09/24/2020   Transaminitis 09/24/2020   DM (diabetes mellitus), type 2 ([place]) 09/24/2020   Acute ST elevation myocardial infarction (STEMI) due to occlusion of left anterior descending (LAD) coronary artery ([place]) 09/22/2020   HTN (hypertension) 09/22/2020   Hyperlipidemia 09/22/2020   STEMI involving left anterior descending coronary artery ([place]) 09/22/2020   ST elevation myocardial infarction involving left anterior descending (LAD) coronary artery ([place]) 09/22/2020    Past  Surgical History:  Procedure Laterality Date   BUBBLE STUDY  10/02/2020   Procedure: BUBBLE STUDY;  Surgeon: Lelon Perla, MD;  Location: [facility];  Service: Cardiovascular;;   CORONARY/GRAFT ACUTE MI REVASCULARIZATION N/A 09/22/2020   Procedure: Coronary/Graft Acute MI Revascularization;  Surgeon: Martinique, Peter M, MD;  Location: [facility];  Service: Cardiovascular;  Laterality: N/A;   LEFT HEART CATH AND CORONARY ANGIOGRAPHY N/A 09/22/2020   Procedure: LEFT HEART CATH AND CORONARY ANGIOGRAPHY;  Surgeon: Martinique, Peter M, MD;  Location: [facility];  Service: Cardiovascular;  Laterality: N/A;   mastectomy Left    at age 54 due to concerns of cancer   TEE WITHOUT CARDIOVERSION N/A 10/02/2020   Procedure: TRANSESOPHAGEAL ECHOCARDIOGRAM (TEE);  Surgeon: Lelon Perla, MD;  Location: [facility] ENDOSCOPY;  Service: Cardiovascular;  Laterality: N/A;     OB History   No obstetric history on file.     Family History  Problem Relation Age of Onset   Cancer Mother    Lupus Sister    Heart disease Brother    Cancer Maternal Aunt     Social History   Tobacco Use   Smoking status: Former    Packs/day: 0.50    Types: Cigarettes   Smokeless tobacco: Never  Vaping Use   Vaping Use: Never used  Substance Use Topics   Alcohol use: Not Currently   Drug use: Never    Home Medications Prior to Admission medications  Medication Sig Start Date End Date Taking? Authorizing Provider  acetaminophen (TYLENOL) 325 MG tablet Take 2 tablets (650 mg total) by mouth every 4 (four) hours as needed for headache or mild pain. 09/24/20  Yes Isaiah Serge, NP  aspirin EC 81 MG EC tablet Take 1 tablet (81 mg total) by mouth daily. Swallow whole. 09/25/20  Yes Isaiah Serge, NP  metoprolol succinate (TOPROL-XL) 25 MG 24 hr tablet Take 0.5 tablets (12.5 mg total) by mouth daily. Patient taking differently: Take 25 mg by mouth daily. 11/05/20  Yes Angiulli, Lavon Paganini, PA-C  nitroGLYCERIN  (NITROSTAT) 0.4 MG SL tablet Place 1 tablet (0.4 mg total) under the tongue every 5 (five) minutes x 3 doses as needed for chest pain. 11/05/20  Yes Angiulli, Lavon Paganini, PA-C  ticagrelor (BRILINTA) 90 MG TABS tablet Take 1 tablet (90 mg total) by mouth 2 (two) times daily. 11/05/20  Yes Angiulli, Lavon Paganini, PA-C  tiZANidine (ZANAFLEX) 2 MG tablet Take 1 tablet (2 mg total) by mouth 3 (three) times daily. 11/05/20  Yes Angiulli, Lavon Paganini, PA-C  famotidine (PEPCID) 20 MG tablet Take 1 tablet (20 mg total) by mouth daily. Patient not taking: No sig reported 11/05/20   Angiulli, Lavon Paganini, PA-C  potassium chloride (KLOR-CON) 10 MEQ tablet Take 1 tablet (10 mEq total) by mouth daily. Patient not taking: No sig reported 11/06/20   Angiulli, Lavon Paganini, PA-C  rosuvastatin (CRESTOR) 40 MG tablet Take 1 tablet (40 mg total) by mouth daily. Patient not taking: No sig reported 11/05/20 12/05/20  Angiulli, Lavon Paganini, PA-C    Allergies    Patient has no known allergies.  Review of Systems   Review of Systems  Constitutional:  Negative for chills and fever.  HENT:  Negative for ear pain and sore throat.   Eyes:  Negative for pain and visual disturbance.  Respiratory:  Positive for shortness of breath. Negative for cough.   Cardiovascular:  Negative for chest pain and palpitations.  Gastrointestinal:  Negative for abdominal pain and vomiting.  Genitourinary:  Negative for dysuria and hematuria.  Musculoskeletal:  Negative for arthralgias and back pain.  Skin:  Negative for color change and rash.  Neurological:  Negative for seizures and syncope.  All other systems reviewed and are negative.  Physical Exam Updated Vital Signs BP (!) 186/113   Pulse 92   Temp 98.4 F (36.9 C) (Oral)   Resp (!) 21   Ht '5\' 4"'$  (1.626 m)   Wt 64.7 kg   SpO2 97%   BMI 24.48 kg/m   Physical Exam Vitals and nursing note reviewed.  Constitutional:      General: She is not in acute distress.    Appearance: She is  well-developed.     Comments: Mild tachypnea but no distress  HENT:     Head: Normocephalic and atraumatic.  Eyes:     Conjunctiva/sclera: Conjunctivae normal.  Cardiovascular:     Rate and Rhythm: Normal rate and regular rhythm.     Heart sounds: No murmur heard. Pulmonary:     Comments: Somewhat tachypneic but speaks in full sentences, breath sounds somewhat diminished at bases Abdominal:     Palpations: Abdomen is soft.     Tenderness: There is no abdominal tenderness.  Musculoskeletal:     Cervical back: Neck supple.  Skin:    General: Skin is warm and dry.  Neurological:     Mental Status: She is alert.    ED Results / Procedures /  Treatments   Labs (all labs ordered are listed, but only abnormal results are displayed) Labs Reviewed  CBC WITH DIFFERENTIAL/PLATELET - Abnormal; Notable for the following components:      Result Value   RBC 3.62 (*)    Hemoglobin 10.5 (*)    HCT 32.8 (*)    Platelets 443 (*)    All other components within normal limits  BASIC METABOLIC PANEL - Abnormal; Notable for the following components:   CO2 21 (*)    Glucose, Bld 130 (*)    Creatinine, Ser 1.06 (*)    All other components within normal limits  BRAIN NATRIURETIC PEPTIDE - Abnormal; Notable for the following components:   B Natriuretic Peptide 1,581.0 (*)    All other components within normal limits  TROPONIN I (HIGH SENSITIVITY) - Abnormal; Notable for the following components:   Troponin I (High Sensitivity) 92 (*)    All other components within normal limits  MAGNESIUM  PROTIME-INR  TROPONIN I (HIGH SENSITIVITY)    EKG EKG Interpretation  Date/Time:  Tuesday November 20 2020 12:49:40 EDT Ventricular Rate:  103 PR Interval:  164 QRS Duration: 84 QT Interval:  346 QTC Calculation: 453 R Axis:   126 Text Interpretation: Right and left arm electrode reversal, interpretation assumes no reversal Sinus tachycardia Probable left atrial enlargement Right axis deviation Consider  left ventricular hypertrophy Confirmed by Madalyn Rob [phone]) on 11/20/2020 2:17:50 PM  Radiology DG Chest Portable 1 View  Result Date: 11/20/2020 CLINICAL DATA:  Shortness of breath EXAM: PORTABLE CHEST 1 VIEW COMPARISON:  Chest radiograph 10/20/2020 FINDINGS: The heart is mildly enlarged, unchanged. The mediastinal contours are stable. There are increased interstitial markings bilaterally. There are trace bilateral pleural effusions with patchy adjacent airspace disease likely reflecting subsegmental atelectasis. There is no other focal consolidation. There is no pneumothorax. There is no acute osseous abnormality. IMPRESSION: Cardiomegaly with mild pulmonary interstitial edema and trace bilateral pleural effusions suggesting heart failure. Electronically Signed   By: Valetta Mole M.D.   On: 11/20/2020 14:47    Procedures .Critical Care  Date/Time: 11/20/2020 3:21 PM Performed by: Lucrezia Starch, MD Authorized by: Lucrezia Starch, MD   Critical care provider statement:    Critical care time (minutes):  40   Critical care was necessary to treat or prevent imminent or life-threatening deterioration of the following conditions:  Respiratory failure   Critical care was time spent personally by me on the following activities:  Discussions with consultants, evaluation of patient's response to treatment, examination of patient, ordering and performing treatments and interventions, ordering and review of laboratory studies, ordering and review of radiographic studies, pulse oximetry, re-evaluation of patient's condition, obtaining history from patient or surrogate and review of old charts   Medications Ordered in ED Medications  furosemide (LASIX) injection 40 mg (has no administration in time range)  nitroGLYCERIN (NITROSTAT) SL tablet 0.4 mg (0.4 mg Sublingual Given 11/20/20 1355)    ED Course  I have reviewed the triage vital signs and the nursing notes.  Pertinent labs & imaging  results that were available during my care of the patient were reviewed by me and considered in my medical decision making (see chart for details).    MDM Rules/Calculators/A&P                           60 year old lady with recent admission for CAD s/p DES to LAD, stroke presenting for shortness of breath.  On  exam, patient noted to be somewhat tachypneic but not in distress, speaking in full sentences.  EKG without STEMI, troponin mildly elevated.  No chest pain, lower concern for ACS.  BNP very elevated, chest x-ray concerning for heart failure.  Suspect patient is experiencing heart failure exacerbation, exacerbated by her hypertension.  Provided nitroglycerin, Lasix.  Mildly hypoxic on room air, will admit for further management.  BP improved after receiving sublingual nitro, however became persistently hypertensive despite 3 doses, will start on nitro gtt.  Final Clinical Impression(s) / ED Diagnoses Final diagnoses:  Heart failure, unspecified HF chronicity, unspecified heart failure type (HCC)  Elevated brain natriuretic peptide (BNP) level  Elevated troponin  Acute respiratory failure with hypoxia [facility])    Rx / DC Orders ED Discharge Orders     None        Lucrezia Starch, MD 11/20/20 1524    Lucrezia Starch, MD 11/20/20 [phone]

## 2020-11-20 NOTE — Progress Notes (Signed)
Subjective:    Patient ID: Tracey Meyer, female    DOB: [birth year], 60 y.o.   MRN: [id number]  HPI: Tracey Meyer is a 60 y.o. female who is here for Hospital Follow up of her Embolic Stroke, Chronic Combined Systolic and Diastolic Congestive Heart Failure,  Uncontrolled Hypertension and Shortness of Breath. On 09/22/2020, Per Reesa Chew PA discharge summary: she was evaluated at [facility] for abdominal pain with nausea, vomiting, diarrhea and diaphoresis. She was noted to have malignany hypertension 215/110 and EKG showed NSTEMI, she was transferred to [facility]..  Dr Martinique note: on 07/o2/2022 Tracey Meyer has a history of HTN, HLD, and "borderline DM". Hasn't seen a doctor in years and has not been on any medication. Yesterday 7 pm she developed severe upper abdominal pain associated with nausea, vomiting, diarrhea and diaphoresis. Pain was constant and unremitting. Presented to [facility] hospital ED this am and Ecg was consistent with anterior STEMI. She was severely hypertensive with BP 215/110. Given ASA, IV heparin, IV lopressor and transferred here for emergent cardiac cath. Denies any prior history of heart disease. She does smoke 1/2 pk/day. States her brother has some type of heart disease.   Ms. Whoolery underwent emergent cardiac cath : on 09/22/2020, via Dr Martinique:      Procedure Laterality Anesthesia  Coronary/Graft Acute MI Revascularization N/A Local  LEFT HEART CATH AND CORONARY ANGIOGRAPHY      She was discharged home on 09/24/2020, she was re-admitted on 09/27/2020, with reports of left sided weakness and slurred speech.   Dr Alcario Drought HPI:  on 09/27/2020 HPI: Tracey Meyer is a 60 y.o. female with medical history significant of DM2, HTN, HLD, smoking.   Pt is unfortunately not having a good week: On 7/1 pt presented to ED with LUQ abd pain, nausea, vomiting, diarrhea.  Symptoms determined to be due to anterior wall STEMI with trop > 24,000.  This due  to occluded LAD which was treated with PCI DES.  Pt did have ischemic cardiomyopathy with EF 35-40% and anterior hypokinesis.  No obvious evidence of apical thrombus.   Pt discharged on 7/4 in stable condition.  Per pt she was doing okay until later that evening (after discharge) when (at home) patient developed onset of L sided weakness and slurred speech.  Symptoms are moderate, persistent, and unchanged since onset.  She did not seek medical care for the new neurologic symptoms until today.   Since discharge, pt has also had increasing symptoms of SOB, orthopnea, PND.  Not having peripheral edema.  She has been sleeping with multiple pillows due to SOB.  SOB is also worse with exertion.  Also decreased UOP.   No N/V/D nor abd pain today.  CT Head WO Contrast:  IMPRESSION: 1. 18 x 16 x 17 mm focus of peripheral hypo attenuation in the posterior right parietooccipital region. This is nonspecific and could be related to subacute infarct, but given the somewhat rounded configuration, MRI of the brain without and with contrast recommended to further evaluate. 2. Chronic small vessel white matter ischemic disease.  MR Brain WO Contrast:  IMPRESSION: MRI HEAD:   1. 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 Given the various vascular distributions involved, a central thromboembolic etiology is suspected. 2. Underlying moderate chronic microvascular ischemic disease with a few scattered remote lacunar infarcts  involving the left thalamus and pons.   MRA HEAD:   1. Technically limited exam due to motion artifact. 2. Negative intracranial MRA for large vessel occlusion. Intracranial atherosclerotic disease with associated moderate stenoses involving the distal left V4 segment, distal basilar artery, and  cavernous right ICA as above. 3. 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   MRA NECK:   1. Technically limited exam due to motion artifact. 2. Approximate 70% atheromatous stenosis at the origin of the left ICA. 3. Otherwise negative MRA of the neck. No other hemodynamically significant or correctable stenosis identified.   Neurology was consulted:   Dr: Erlinda Hong note from 10/02/2020  ASSESSMENT/PLAN Tracey Meyer is a 60 y.o. female with PMH significant for DM2, tobacco use and trying to quit, HLD, CAD s/p angio and stenting of mLAD a few days ago who presents with L sided weakness that started on 09/24/20 after discharge. She has also been having shortness of breath and it gets worse with lying flat.   Stroke:  scattered infarct likely cardioembolic secondary to cardiomyopathy with low EF CT head :No acute abnormality. 8 x 16 x 17 mm focus of peripheral hypo attenuation in the posterior right parietooccipital region.  MRI  1. 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 Given the various vascular distributions involved, a central thromboembolic etiology is suspected. 2. Underlying moderate chronic microvascular ischemic disease with a few scattered remote lacunar infarcts involving the left thalamus and pons. CTA head and neck only left V4 moderate stenosis, no left ICA stenosis or significant posterior circulation severe stenosis 2D Echo: 30-35% down from 35-40% on 09/22/20 TEE no LV or LA thrombus Recommend 30 day cardiac event monitoring to rule out afib LDL 130 HgbA1c 6.8 VTE prophylaxis - Lovenox aspirin 81 mg daily and Brilinta (ticagrelor) 90 mg bid prior to admission, now on aspirin 81 mg daily and Brilinta (ticagrelor) 90 mg bid.  Continue ASA and Brillinta per cardiology  recommendations. Therapy recommendations: CIR Disposition:  pending   Recent STEMI s/p stent anterior STEMI 09/21/20 s/p LHC with PCI/DES to LAD.  TTE EF 35-40% with anterior hypokinesis but no apical thrombus at that time. This admission EF 30 to 35% On Aspirin and brilinta and statin Cardiology on board   Cardiomyopathy  Cardiology on board EF 35-40% on 09/22/20 Current TTE repeat EF 30-35% TEE no thrombus On coreg, entresto, spironolactone at home   NO left ICA stenosis MRA neck showed Left ICA 70% stenosis  likely asymptomatic at this time CT head and neck left V4 moderate stenosis. No Left ICA significant stenosis    Ms. Mise was admitted to inpatient rehabilitation on 10/09/2020 and discharged home on 11/05/2020.  Ms. Tremont arrived to office today for Hospital follow up appointment of her  Embolic Stroke, Chronic Combined Systolic and Diastolic Congestive Heart Failure,  Uncontrolled Hypertension and Dyspne on Exertion. She arrived to office with uncontrolled hypertension and SOB at rest.  Ms. Felch states she is compliant with her Metoprolol, according to the bottle she was suppose to be taking 0.5 tablet daily ( 12.5 mg), Ms. Bonner states she was taking a whole tablet  25 mg. This provider called [place] and they verified , the order was for 0.5 mg daily. Today, her bottle is empty, Ms. Holsey states  she took her last Metoprolol this morning . Ms. Custis is in agreement with be evaluated at [facility] ED, EMS was called. Her significant other in room, all questions answered.   Placed a call to Dr Ellyn Hack office and was able to obtain a [place] appointment with cardiology.   Ms. Orlosky states she doesn't have a PCP, she was sent to ED for evaluation of her uncontrolled hypertension and SOB.  She will be assess and they will address her Uncontrolled Hypertension and SOB.Ms. Cajina, doesn't have any anti-hypertensive at this time, she was  transferred to [facility] ED via EMS. Dr. Roslynn Amble note was reviewed. Ms. Shure will be admitted.   Ms. Orvan Seen has no scheduled outpatient Therapy due to being uninsured. She was instructed by inpatient therapist to continue HEP as tolerated. She arrived to office in wheelchair.   Fiancee in room: [name].   Pain Inventory Average Pain 4 Pain Right Now 4 My pain is intermittent, aching, and numbness  LOCATION OF PAIN  left arm, left buttock  BOWEL Number of stools per week: 3-4 Oral laxative use No  Type of laxative none Enema or suppository use No  History of colostomy No  Incontinent No   BLADDER Normal    Mobility walk with assistance use a walker ability to climb steps?  no do you drive?  no use a wheelchair needs help with transfers Do you have any goals in this area?  yes  Function disabled: date disabled in progress. 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  yes  Neuro/Psych weakness numbness trouble walking depression  Prior Studies Any changes since last visit?  no  Physicians involved in your care Any changes since last visit?  no   Family History  Problem Relation Age of Onset   Cancer Mother    Lupus Sister    Heart disease Brother    Cancer Maternal Aunt    Social History   Socioeconomic History   Marital status: Single    Spouse name: Not on file   Number of children: Not on file   Years of education: Not on file   Highest education level: Not on file  Occupational History   Not on file  Tobacco Use   Smoking status: Former    Packs/day: 0.50    Types: Cigarettes   Smokeless tobacco: Never  Vaping Use   Vaping Use: Never used  Substance and Sexual Activity   Alcohol use: Not Currently   Drug use: Never   Sexual activity: Not on file  Other Topics Concern   Not on file  Social History Narrative   Not on file    Social Determinants of Health   Financial Resource Strain: Not on file  Food Insecurity: Not on file  Transportation Needs: Not on file  Physical Activity: Not on file  Stress: Not on file  Social Connections: Not on file   Past Surgical History:  Procedure Laterality Date   BUBBLE STUDY  10/02/2020   Procedure: BUBBLE STUDY;  Surgeon: Lelon Perla, MD;  Location: [facility] ENDOSCOPY;  Service: Cardiovascular;;   CORONARY/GRAFT ACUTE MI REVASCULARIZATION N/A 09/22/2020   Procedure: Coronary/Graft Acute MI Revascularization;  Surgeon: Martinique, Peter M, MD;  Location: [facility] CV LAB;  Service: Cardiovascular;  Laterality: N/A;   LEFT HEART CATH AND CORONARY ANGIOGRAPHY N/A 09/22/2020   Procedure: LEFT HEART CATH AND  CORONARY ANGIOGRAPHY;  Surgeon: Martinique, Peter M, MD;  Location: [facility] CV LAB;  Service: Cardiovascular;  Laterality: N/A;   mastectomy Left    at age 60 due to concerns of cancer   TEE WITHOUT CARDIOVERSION N/A 10/02/2020   Procedure: TRANSESOPHAGEAL ECHOCARDIOGRAM (TEE);  Surgeon: Lelon Perla, MD;  Location: [facility] ENDOSCOPY;  Service: Cardiovascular;  Laterality: N/A;   Past Medical History:  Diagnosis Date   Cardiomyopathy, ischemic 09/24/2020   DM (diabetes mellitus), type 2 ([place]) 09/24/2020   HTN (hypertension)    Hyperlipidemia    Prediabetes    S/P angioplasty with stent to mLAD 09/22/20  09/24/2020   Tobacco abuse 09/24/2020   Transaminitis 09/24/2020   BP (!) 183/123   Pulse 100   Temp 98.8 F (37.1 C)   Ht '5\' 4"'$  (1.626 m)   SpO2 96%   BMI 24.48 kg/m   Opioid Risk Score:   Fall Risk Score:  `1  Depression screen PHQ 2/9  Depression screen PHQ 2/9 11/20/2020  Decreased Interest 0  Down, Depressed, Hopeless 1  PHQ - 2 Score 1  Altered sleeping 0  Tired, decreased energy 1  Change in appetite 1  Feeling bad or failure about yourself  1  Trouble concentrating 0  Moving slowly or fidgety/restless 1  Suicidal thoughts 0  PHQ-9 Score 5     Review of Systems  Respiratory:  Positive for cough and shortness of breath.   Musculoskeletal:  Positive for gait problem.  Neurological:  Positive for weakness and numbness.  Psychiatric/Behavioral:         Depression  All other systems reviewed and are negative.     Objective:   Physical Exam Vitals and nursing note reviewed.  Constitutional:      Appearance: Normal appearance.  Cardiovascular:     Rate and Rhythm: Normal rate and regular rhythm.     Pulses: Normal pulses.     Heart sounds: Normal heart sounds.  Pulmonary:     Effort: Pulmonary effort is normal.     Breath sounds: Normal breath sounds.  Musculoskeletal:     Cervical back: Normal range of motion and neck supple.     Comments: Normal Muscle Bulk and Muscle Testing Reveals:  Upper Extremities: Right: Full ROM and Muscle Strength  5/5 Left Upper Extremity: Decreased ROM 45 Degrees ad Muscle Strength 1/5 Lower Extremities: Right: Full ROM and Muscle Strength 5/5 Left Lower Extremity: Decreased ROM and Muscle Strength 3/5 Wearing Left AFO  Arrived in wheelchair     Skin:    General: Skin is warm and dry.  Neurological:     Mental Status: She is alert and oriented to person, place, and time.  Psychiatric:        Mood and Affect: Mood normal.        Behavior: Behavior normal.         Assessment & Plan:  Embolic Stroke: She has a scheduled appointment with Neurology: Continue current medication regimen. Continue to monitor.  2.Chronic Combined Systolic and Diastolic Congestive Heart Failure,: This provider called Cardiology, she has a scheduled appointment with cardiology.   3.Uncontrolled Hypertension: Ms. Wallenhorst reports she is compliant with her anti-hypertensive medication, see HPI for details. She was sent to [facility] via EMS today. Ms. Coley doesn't have any antihypertensive medications. She will be assess at [facility] ED and her uncontrolled hypertension will be addressed by MD.   4.Shortness of Breath: Ms. Charles was sent to [facility] ED  for evaluation via EMS.   F/U in 4- 6 weeks with Dr Letta Pate

## 2020-11-20 NOTE — ED Triage Notes (Signed)
BIB EMS for shortness of breath times 2 days and hypertension. EMS states that she's had a recent STEMI and stroke shortly after leaving her with left sided weakness. She has been taking her blood pressure medications as prescribed.

## 2020-11-21 ENCOUNTER — Inpatient Hospital Stay (HOSPITAL_COMMUNITY): Payer: Medicaid Other

## 2020-11-21 ENCOUNTER — Encounter (HOSPITAL_COMMUNITY): Payer: Self-pay | Admitting: Internal Medicine

## 2020-11-21 DIAGNOSIS — I160 Hypertensive urgency: Secondary | ICD-10-CM | POA: Diagnosis present

## 2020-11-21 DIAGNOSIS — I5043 Acute on chronic combined systolic (congestive) and diastolic (congestive) heart failure: Secondary | ICD-10-CM

## 2020-11-21 DIAGNOSIS — I5042 Chronic combined systolic (congestive) and diastolic (congestive) heart failure: Secondary | ICD-10-CM

## 2020-11-21 HISTORY — DX: Hypertensive urgency: I16.0

## 2020-11-21 LAB — GLUCOSE, CAPILLARY
Glucose-Capillary: 126 mg/dL — ABNORMAL HIGH (ref 70–99)
Glucose-Capillary: 167 mg/dL — ABNORMAL HIGH (ref 70–99)
Glucose-Capillary: 126 mg/dL — ABNORMAL HIGH (ref 70–99)
Glucose-Capillary: 116 mg/dL — ABNORMAL HIGH (ref 70–99)

## 2020-11-21 LAB — BASIC METABOLIC PANEL
Anion gap: 10 (ref 5–15)
BUN: 16 mg/dL (ref 6–20)
CO2: 21 mmol/L — ABNORMAL LOW (ref 22–32)
Calcium: 9.6 mg/dL (ref 8.9–10.3)
Chloride: 106 mmol/L (ref 98–111)
Creatinine, Ser: 1.12 mg/dL — ABNORMAL HIGH (ref 0.44–1.00)
GFR, Estimated: 56 mL/min — ABNORMAL LOW (ref >60)
Glucose, Bld: 123 mg/dL — ABNORMAL HIGH (ref 70–99)
Potassium: 3.3 mmol/L — ABNORMAL LOW (ref 3.5–5.1)
Sodium: 137 mmol/L (ref 135–145)

## 2020-11-21 IMAGING — DX DG ABDOMEN 1V
1 series · 2 of 2 positions shown · non-contrast
Comparison: Abdominal x-ray [DATE]. CT abdomen and pelvis
[DATE].

CLINICAL DATA: Abdominal pain.

EXAM:
ABDOMEN - 1 VIEW

[Series 1: abdomen · 0.14mm/px · 2 of 2 slices shown]
[im 1/2]
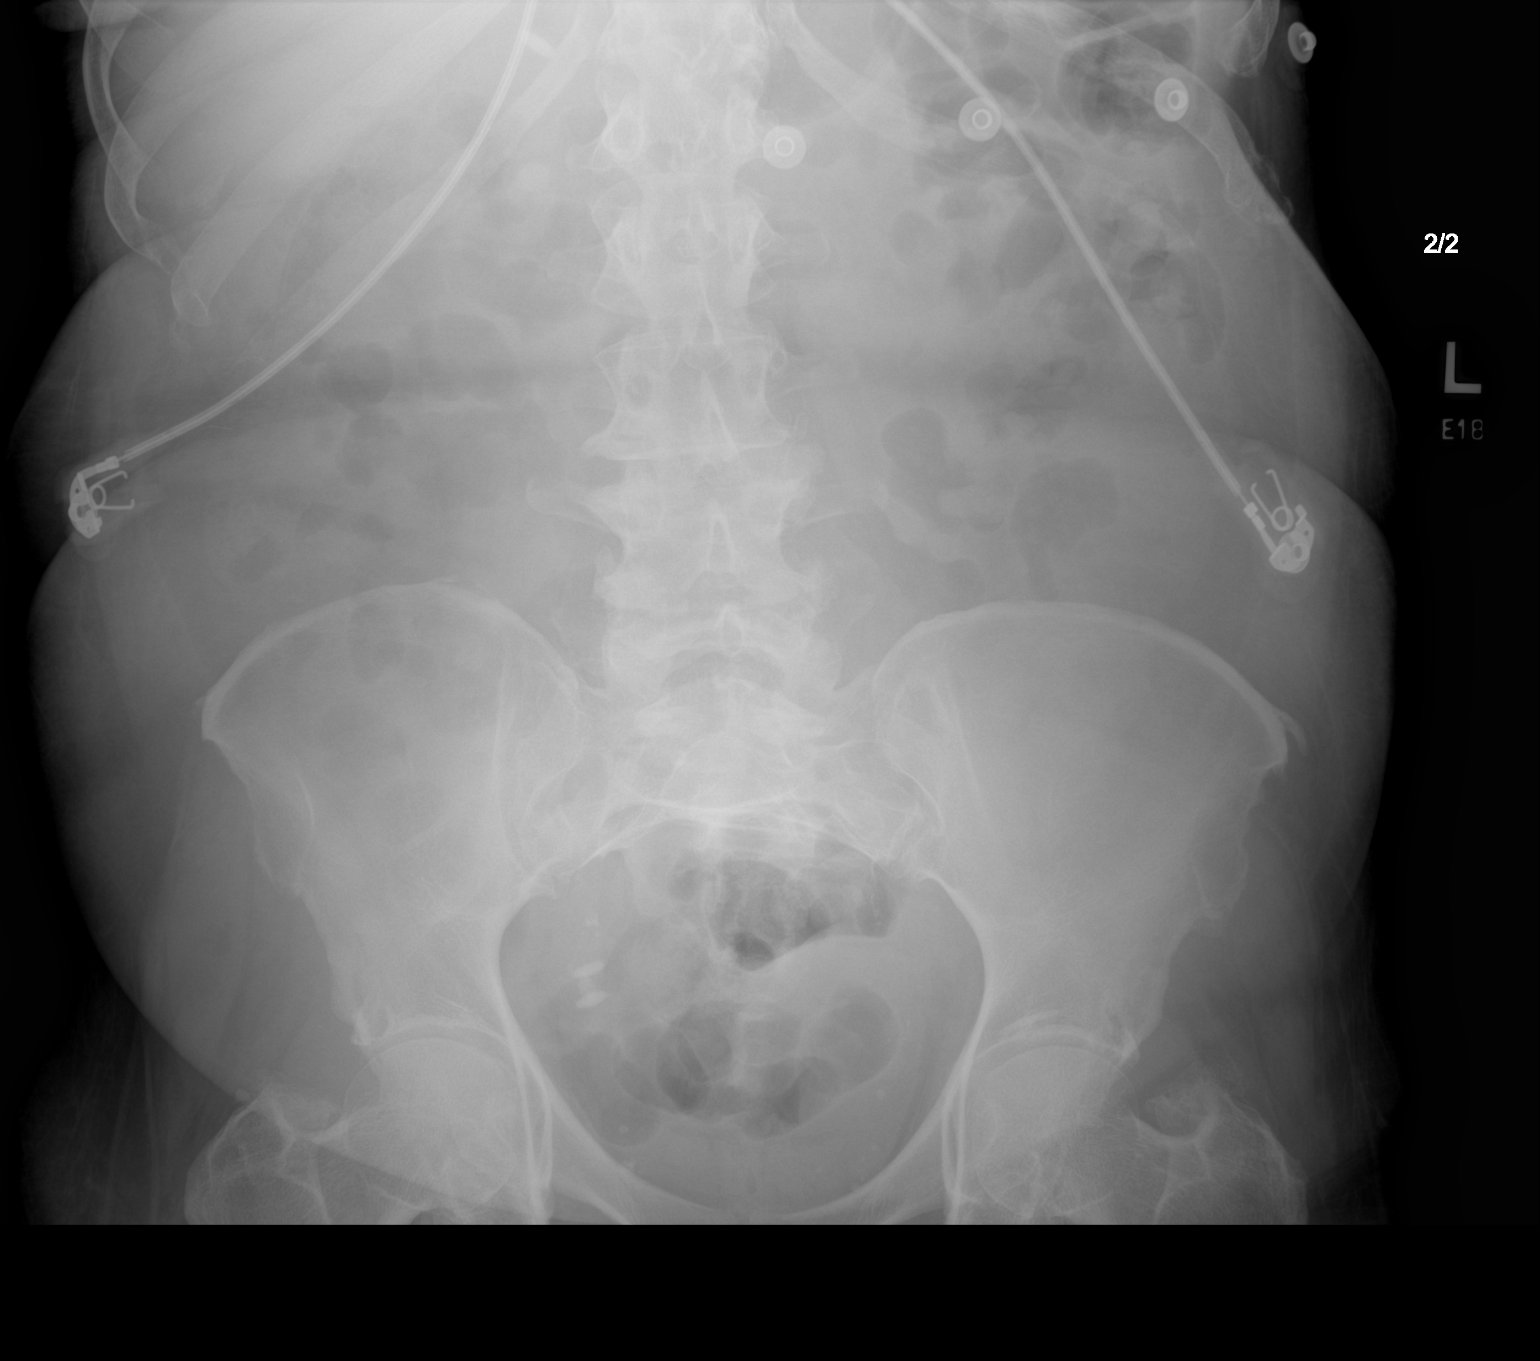
[im 2/2]
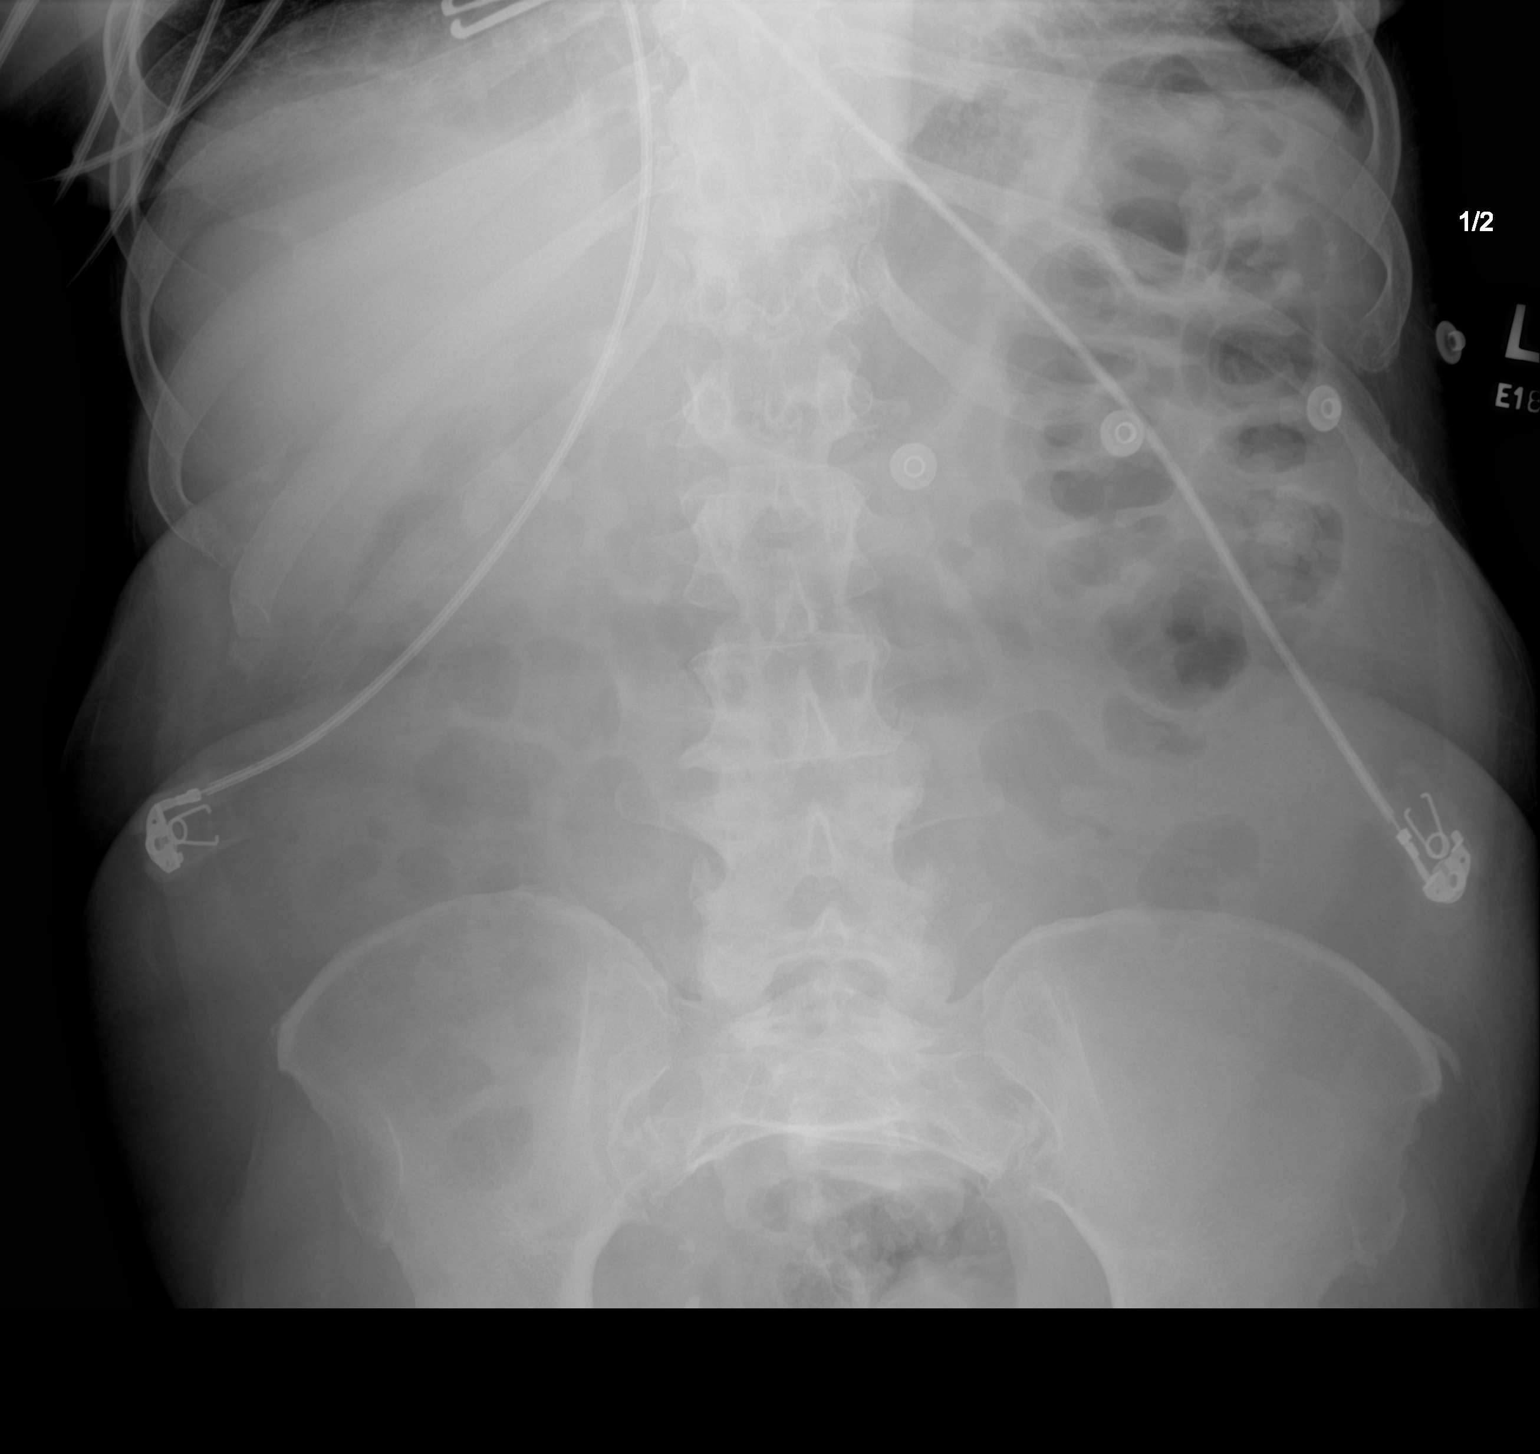

[2 of 2 positions shown; findings below may reference images not displayed]

FINDINGS: Bowel-gas pattern is nonobstructive.  No free air identified.

Phleboliths are present in the pelvis. Additionally, there are
several calcifications in the right hemipelvis to which are oval
measuring up to 8 mm. These are new from the prior examination.
There is also a faint rounded density in the right upper abdomen
measuring 7 mm in diameter which may represent bowel content versus
gallstone.

No acute fractures are identified. There is focal heterogeneity,
sclerosis and calcifications involving the right ischial tuberosity,
partially imaged. This was present in [JV] CT and x-ray, but appears
more prominent in size.
IMPRESSION: 1. Nonobstructive, nonspecific bowel gas pattern.
2. Questionable right upper quadrant gallstone versus fecal content.
3. New calcifications in the right hemipelvis. Differential
diagnosis includes fecal content, urinary tract calculi,
phleboliths, or appendicolith in appropriate clinical setting.

## 2020-11-21 IMAGING — DX DG CHEST 1V
1 series · 1 of 1 positions shown · non-contrast
Comparison: Chest radiograph [DATE]

CLINICAL DATA: CHF, hypertension

EXAM:
CHEST  1 VIEW

[chest ap]
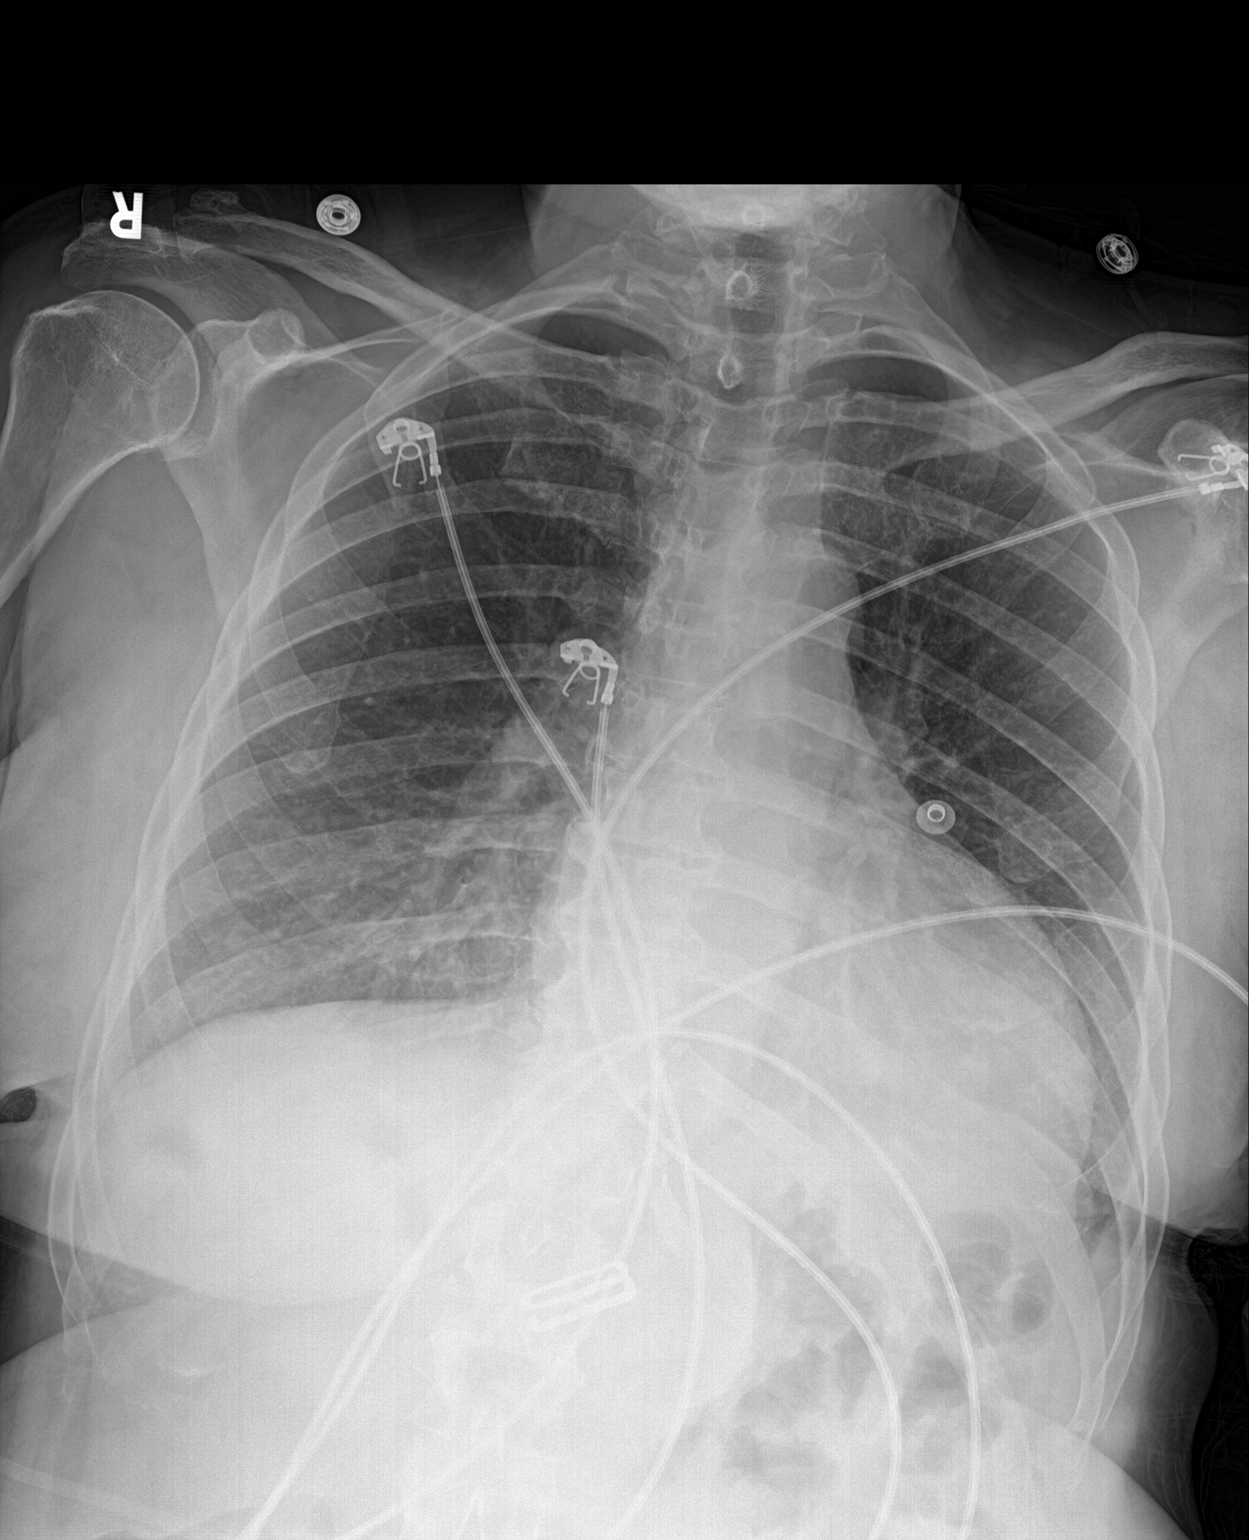

[1 of 1 positions shown; findings below may reference images not displayed]

FINDINGS: The cardiomediastinal silhouette is stable.

Aeration of the left base has improved. There remains a small left
pleural effusion. The right lung is clear. There is no right
effusion. There is no pneumothorax. Pulmonary edema has improved.

There is no acute osseous abnormality.
IMPRESSION: Improved aeration of the left base and improved pulmonary edema.
Trace residual left pleural effusion.

## 2020-11-21 MED ORDER — HYDRALAZINE HCL 25 MG PO TABS: 25.0000 mg | ORAL_TABLET | Freq: Four times a day (QID) | ORAL | Status: DC | PRN

## 2020-11-21 MED ORDER — TRAMADOL HCL 50 MG PO TABS
50.0000 mg | ORAL_TABLET | Freq: Four times a day (QID) | ORAL | Status: DC | PRN
  Administered 2020-11-21: 50 mg via ORAL
  Filled 2020-11-21: qty 1

## 2020-11-21 MED ORDER — POTASSIUM CHLORIDE CRYS ER 10 MEQ PO TBCR
30.0000 meq | EXTENDED_RELEASE_TABLET | ORAL | Status: AC
  Administered 2020-11-21 (×2): 30 meq via ORAL
  Filled 2020-11-21 (×2): qty 3

## 2020-11-21 NOTE — Progress Notes (Signed)
Heart Failure Nurse Navigator Progress Note  Pt laying in bed talking on room phone, pt requested Navigator to come back later.   Will attempt to see this afternoon.  Pricilla Holm, MSN, RN Heart Failure Nurse Navigator [phone]

## 2020-11-21 NOTE — Progress Notes (Signed)
PROGRESS NOTE    Tracey Meyer  [id number] DOB: 10/21/1960 DOA: 11/20/2020 PCP: Merryl Hacker, No    Brief Narrative:  Tracey Meyer is a 60 year old female with past medical history significant for chronic combined systolic and diastolic congestive heart failure, essential hypertension, CAD s/p LAD stent, history of embolic CVA, HLD who presented to [facility] ED on 8/30 with progressive shortness of breath.  Patient reports compliance with her home medications.  Shortness of breath worsens with minimal activity.  Patient went to see her physical therapist who checked her blood pressure with a systolic greater than [id number] and she was directed to the ED for further evaluation.  In the ED, temperature 98.4 F, HR 95, RR 35, BP 189/120, SPO2 84% on room air.  Sodium 139, potassium 3.8, chloride 109, CO2 21, glucose 130, BUN 14, creatinine 1.06, magnesium 2.1.  BNP 1581.  High sensitive troponin 92.  WBC 5.4, hemoglobin 10.5, platelets 443.  Chest x-ray with cardiomegaly and mild pulmonary interstitial edema and trace bilateral pleural effusions.  EKG with sinus tachycardia, rate 103, no ST elevation/depression.  Patient was placed on 3 L supplemental oxygen, started on nitroglycerin drip.  EDP consulted [facility] for further evaluation management of decompensated combined congestive heart failure and hypertensive urgency.   Assessment & Plan:   Active Problems:   Chronic combined systolic and diastolic congestive heart failure (HCC)   Heart failure (HCC)   Acute hypoxic respiratory failure, POA Acute on chronic combined systolic/diastolic congestive heart failure Patient presenting to ED with progressive shortness of breath.  Chest x-ray findings consistent with congestive heart failure exacerbation with interstitial edema and pleural effusions and elevated BNP with associated hypoxia with SPO2 84% on room air on arrival.  TTE 09/28/2020 with LVEF 30-35%, mild LVH, grade 2 diastolic dysfunction, severe  hypokinesis LV. --Furosemide 40 mg IV q12h --Carvedilol 3.125 mg PO BID and Lisinopril '10mg'$  PO daily --Strict I's and O's and daily weights --Fluid restrict 1877m per day --Monitor renal function closely daily on IV Lasix  Hypertensive urgency Patient presenting to the ED after having blood pressure checked at physical therapist office with SBP greater than 220.  On arrival patient's blood pressure elevated at 189/120.  Patient was initially started on a nitroglycerin drip. --Carvedilol 3.125 mg p.o. twice daily --Lisinopril 10 mg p.o. daily --Wean off nitroglycerin drip --Hydralazine 25 mg p.o. q6h PRN SBP >170 or DBP >1103 --Furosemide 40 mg IV every 12 hours --Continue monitor BP closely and will adjust as needed  CAD s/p PCI/stent --Continue Brilinta '90mg'$  PO q12h --Continue aspirin and statin  Type 2 diabetes mellitus Diet controlled at home.  Hemoglobin A1c 6.8 on 09/28/2020. --SSI for coverage --CBGs qAC/HS  Hyperlipidemia: Crestor 40 mg p.o. daily  Hx CVA --Continue Crestor 40 mg p.o. daily and aspirin 81 mg p.o. 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 --Continue aspirin/Brilinta/statin --Outpatient follow-up with vascular surgery   DVT prophylaxis: enoxaparin (LOVENOX) injection 40 mg Start: 11/20/20 1615   Code Status: Full Code Family Communication: No family present at bedside this morning.  Disposition Plan:  Level of care: Progressive Status is: Inpatient  Remains inpatient appropriate because:Ongoing diagnostic testing needed not appropriate for outpatient work up, Unsafe d/c plan, IV treatments appropriate due to intensity of illness or inability to take PO, and Inpatient level of care appropriate due to severity of illness  Dispo: The patient is from: Home  Anticipated d/c is to: Home              Patient currently is not medically stable to d/c.   Difficult to  place patient No   Consultants:  none  Procedures:  none  Antimicrobials:  none   Subjective: Patient seen examined at bedside, resting comfortably.  Blood pressure much improved.  Weaning off nitroglycerin drip this morning.  No other specific questions or concerns at this time other than when she can discharge home.  Denies headache, no fever/chills/night sweats, no nausea/vomiting/diarrhea, no chest pain, no palpitations, no abdominal pain, no weakness, no fatigue, no paresthesias.  No acute events overnight per nursing staff.  Objective: Vitals:   11/21/20 0845 11/21/20 0911 11/21/20 1100 11/21/20 1129  BP: 107/73 99/71 122/89 140/74  Pulse: 94  79 79  Resp: '20  20 20  '$ Temp: 98.6 F (37 C)   98.4 F (36.9 C)  TempSrc: Oral   Oral  SpO2: 98%  97% 93%  Weight:      Height:        Intake/Output Summary (Last 24 hours) at 11/21/2020 1147 Last data filed at 11/21/2020 0942 Gross per 24 hour  Intake 860.64 ml  Output 700 ml  Net 160.64 ml   Filed Weights   11/20/20 1300 11/20/20 2154 11/21/20 0615  Weight: 64.7 kg 64.5 kg 65.1 kg    Examination:  General exam: Appears calm and comfortable  Respiratory system: Breath sounds with mild crackles bilateral bases, no wheezing, normal respiratory effort, no accessory muscle use, on 2 L nasal cannula with SPO2 98%. Cardiovascular system: S1 & S2 heard, RRR. No JVD, murmurs, rubs, gallops or clicks. No pedal edema. Gastrointestinal system: Abdomen is nondistended, soft and nontender. No organomegaly or masses felt. Normal bowel sounds heard. Central nervous system: Alert and oriented. No focal neurological deficits. Extremities: Symmetric 5 x 5 power. Skin: No rashes, lesions or ulcers Psychiatry: Judgement and insight appear normal. Mood & affect appropriate.     Data Reviewed: I have personally reviewed following labs and imaging studies  CBC: Recent Labs  Lab 11/20/20 1314  WBC 5.4  NEUTROABS 3.4  HGB 10.5*  HCT  32.8*  MCV 90.6  PLT 123456*   Basic Metabolic Panel: Recent Labs  Lab 11/20/20 1314 11/21/20 0313  NA 139 137  K 3.8 3.3*  CL 109 106  CO2 21* 21*  GLUCOSE 130* 123*  BUN 14 16  CREATININE 1.06* 1.12*  CALCIUM 9.8 9.6  MG 2.1  --    GFR: Estimated Creatinine Clearance: 48.5 mL/min (A) (by C-G formula based on SCr of 1.12 mg/dL (H)). Liver Function Tests: No results for input(s): AST, ALT, ALKPHOS, BILITOT, PROT, ALBUMIN in the last 168 hours. No results for input(s): LIPASE, AMYLASE in the last 168 hours. No results for input(s): AMMONIA in the last 168 hours. Coagulation Profile: Recent Labs  Lab 11/20/20 1314  INR 1.0   Cardiac Enzymes: No results for input(s): CKTOTAL, CKMB, CKMBINDEX, TROPONINI in the last 168 hours. BNP (last 3 results) No results for input(s): PROBNP in the last 8760 hours. HbA1C: No results for input(s): HGBA1C in the last 72 hours. CBG: Recent Labs  Lab 11/20/20 1719 11/20/20 2226 11/21/20 0611 11/21/20 1131  GLUCAP 107* 122* 126* 167*   Lipid Profile: No results for input(s): CHOL, HDL, LDLCALC, TRIG, CHOLHDL, LDLDIRECT in the last 72 hours. Thyroid Function Tests: Recent Labs    11/20/20 2129  TSH 0.978   Anemia Panel: No  results for input(s): VITAMINB12, FOLATE, FERRITIN, TIBC, IRON, RETICCTPCT in the last 72 hours. Sepsis Labs: No results for input(s): PROCALCITON, LATICACIDVEN in the last 168 hours.  No results found for this or any previous visit (from the past 240 hour(s)).       Radiology Studies: DG Chest 1 View  Result Date: 11/21/2020 CLINICAL DATA:  CHF, hypertension EXAM: CHEST  1 VIEW COMPARISON:  Chest radiograph 11/20/2020 FINDINGS: The cardiomediastinal silhouette is stable. Aeration of the left base has improved. There remains a small left pleural effusion. The right lung is clear. There is no right effusion. There is no pneumothorax. Pulmonary edema has improved. There is no acute osseous abnormality.  IMPRESSION: Improved aeration of the left base and improved pulmonary edema. Trace residual left pleural effusion. Electronically Signed   By: Valetta Mole M.D.   On: 11/21/2020 10:35   DG Chest Portable 1 View  Result Date: 11/20/2020 CLINICAL DATA:  Shortness of breath EXAM: PORTABLE CHEST 1 VIEW COMPARISON:  Chest radiograph 10/20/2020 FINDINGS: The heart is mildly enlarged, unchanged. The mediastinal contours are stable. There are increased interstitial markings bilaterally. There are trace bilateral pleural effusions with patchy adjacent airspace disease likely reflecting subsegmental atelectasis. There is no other focal consolidation. There is no pneumothorax. There is no acute osseous abnormality. IMPRESSION: Cardiomegaly with mild pulmonary interstitial edema and trace bilateral pleural effusions suggesting heart failure. Electronically Signed   By: Valetta Mole M.D.   On: 11/20/2020 14:47        Scheduled Meds:  aspirin EC  81 mg Oral Daily   carvedilol  3.125 mg Oral BID WC   enoxaparin (LOVENOX) injection  40 mg Subcutaneous Q24H   furosemide  40 mg Intravenous BID   insulin aspart  0-15 Units Subcutaneous TID WC   lisinopril  10 mg Oral Daily   potassium chloride  30 mEq Oral Q3H   rosuvastatin  40 mg Oral Daily   sodium chloride flush  3 mL Intravenous Q12H   ticagrelor  90 mg Oral BID   tiZANidine  2 mg Oral TID   Continuous Infusions:  sodium chloride     nitroGLYCERIN 20 mcg/min (11/21/20 1111)     LOS: 1 day    Time spent: 39 minutes spent on chart review, discussion with nursing staff, consultants, updating family and interview/physical exam; more than 50% of that time was spent in counseling and/or coordination of care.    Kentrail Shew J [place] DO [facility] Available via Epic secure chat 7am-7pm After these hours, please refer to coverage provider listed on [email or web address] 11/21/2020, 11:47 AM

## 2020-11-21 NOTE — Progress Notes (Signed)
Heart Failure Nurse Navigator Progress Note  PCP: Pcp, No PCP-Cardiologist: none Admission Diagnosis: CHF Admitted from: home with boyfriend  Presentation:   [name] presented on 8/30 with SOB and elevated BP from rehab/PT appointment via EMS. Pt resting in bed on room air. States she is able to get into a car and is able to walk with the assistance of a walker or wheelchair--she has both. She also has a raised toilet and shower chair per statement. Pt states she will reside with her boyfriend after discharge in [place]. States his address is [place]. Pt states neither she nor boyfriend drive, enrolled in [facility] services.  Pt states she thinks she was taking her medication as prescribed. Does not use pill box. Endorses not knowing she should be limiting fluid intake. States she drink roughly 100oz of fluid daily. Gave HF patient education booklet, reviewed dietary/fluid modifications.   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    Comment: pending disability as of 11/21/20  Tobacco Use   Smoking status: Former    Packs/day: 0.50    Years: 45.00    Pack years: 22.50    Types: Cigarettes    Quit date: 09/22/2020    Years since quitting: 0.1   Smokeless tobacco: Never  Vaping Use   Vaping Use: Never used  Substance and Sexual Activity   Alcohol use: Not Currently   Drug use: Never    Sexual activity: Not on file  Other Topics Concern   Not on file  Social History Narrative   Not on file   Social Determinants of Health   Financial Resource Strain: Medium Risk   Difficulty of Paying Living Expenses: Somewhat hard  Food Insecurity: No Food Insecurity   Worried About Charity fundraiser in the Last Year: Never true   Arboriculturist in the Last Year: Never true  Transportation Needs: Unmet Transportation Needs   Lack of Transportation (Medical): Yes   Lack of Transportation (Non-Medical): Yes  Physical Activity: Not on file  Stress: Not on file  Social Connections: Not on file    High Risk Criteria for Readmission and/or Poor Patient Outcomes: Heart failure hospital admissions (last 6 months): 2  No Show rate: 0% Difficult social situation: yes Demonstrates medication adherence: concern for understanding regimen Primary Language: English Literacy level: able to read/write and comprehend. Pt could benefit from closer monitoring of medication set up for verification.   Education Assessment and Provision:  Detailed education and instructions provided on heart failure disease management including the following:  Signs and symptoms of Heart Failure When to call the physician Importance of daily weights Low sodium diet Fluid restriction Medication management Anticipated future follow-up appointments  Patient education given on each of the above topics.  Patient acknowledges understanding via teach back method and acceptance of all instructions.  Education Materials:  "Living Better With Heart Failure" Booklet, HF zone tool, & Daily Weight Tracker Tool.  Patient has scale at home: no, will give at [facility] TOC appt. Patient has pill box at home: pt declined    Barriers of Care:   -transportation -no PCP -no cardiologist -medicaid application pending -disability pending  Considerations/Referrals:   Referral made to Heart Failure Pharmacist Stewardship:  yes, to see at [place] appt Referral made to Heart Failure CSW/NCM TOC: no Referral made to Heart & Vascular TOC clinic: yes, 9/6 @ 12noon.  Items for Follow-up on DC/TOC: -optimize -medication costs -medication compliance? -PCP -Cardiology -transportation: Navigator enrolled in [facility].   Pricilla Holm, MSN, RN Heart Failure Nurse Navigator [phone]

## 2020-11-21 NOTE — Progress Notes (Signed)
Patient has been wean of nitro gtt. B/P is WNL, 124/86.

## 2020-11-22 ENCOUNTER — Other Ambulatory Visit (HOSPITAL_COMMUNITY): Payer: Self-pay

## 2020-11-22 LAB — BASIC METABOLIC PANEL
Anion gap: 10 (ref 5–15)
BUN: 19 mg/dL (ref 6–20)
CO2: 22 mmol/L (ref 22–32)
Calcium: 9.6 mg/dL (ref 8.9–10.3)
Chloride: 105 mmol/L (ref 98–111)
Creatinine, Ser: 1.36 mg/dL — ABNORMAL HIGH (ref 0.44–1.00)
GFR, Estimated: 45 mL/min — ABNORMAL LOW (ref >60)
Glucose, Bld: 126 mg/dL — ABNORMAL HIGH (ref 70–99)
Potassium: 3.9 mmol/L (ref 3.5–5.1)
Sodium: 137 mmol/L (ref 135–145)

## 2020-11-22 LAB — GLUCOSE, CAPILLARY: Glucose-Capillary: 113 mg/dL — ABNORMAL HIGH (ref 70–99)

## 2020-11-22 MED ORDER — FUROSEMIDE 40 MG PO TABS
40.0000 mg | ORAL_TABLET | Freq: Every day | ORAL | 2 refills | Status: DC
  Filled 2020-11-22: qty 30, 30d supply, fill #0

## 2020-11-22 MED ORDER — TIZANIDINE HCL 2 MG PO TABS
2.0000 mg | ORAL_TABLET | Freq: Three times a day (TID) | ORAL | 0 refills | Status: AC
  Filled 2020-11-22: qty 90, 30d supply, fill #0

## 2020-11-22 MED ORDER — FUROSEMIDE 40 MG PO TABS
40.0000 mg | ORAL_TABLET | Freq: Every day | ORAL | Status: DC
  Administered 2020-11-22: 40 mg via ORAL
  Filled 2020-11-22: qty 1

## 2020-11-22 MED ORDER — CARVEDILOL 3.125 MG PO TABS
3.1250 mg | ORAL_TABLET | Freq: Two times a day (BID) | ORAL | 2 refills | Status: DC
  Filled 2020-11-22: qty 60, 30d supply, fill #0

## 2020-11-22 MED ORDER — ROSUVASTATIN CALCIUM 40 MG PO TABS
40.0000 mg | ORAL_TABLET | Freq: Every day | ORAL | 2 refills | Status: DC
  Filled 2020-11-22: qty 30, 30d supply, fill #0

## 2020-11-22 MED ORDER — ASPIRIN 81 MG PO TBEC
81.0000 mg | DELAYED_RELEASE_TABLET | Freq: Every day | ORAL | 2 refills | Status: DC
  Filled 2020-11-22: qty 30, 30d supply, fill #0

## 2020-11-22 MED ORDER — LISINOPRIL 10 MG PO TABS
10.0000 mg | ORAL_TABLET | Freq: Every day | ORAL | 2 refills | Status: DC
  Filled 2020-11-22: qty 30, 30d supply, fill #0

## 2020-11-22 MED ORDER — TICAGRELOR 90 MG PO TABS
90.0000 mg | ORAL_TABLET | Freq: Two times a day (BID) | ORAL | 2 refills | Status: DC
  Filled 2020-11-22: qty 60, 30d supply, fill #0

## 2020-11-22 NOTE — TOC Transition Note (Signed)
Transition of Care [facility]) - CM/SW Discharge Note   Patient Details  Name: Tracey Meyer MRN: [id number] Date of Birth: 29-Aug-1960  Transition of Care [facility]) CM/SW Contact:  Zenon Mayo, RN Phone Number: 11/22/2020, 12:15 PM   Clinical Narrative:    Patient is for dc today, her boyfriend will be at the home when she gets there by [facility], for door to door service.  TOC brought up her meds and NCM assisted her with paying for meds thru fund.   Final next level of care: Home/Self Care Barriers to Discharge: No Barriers Identified   Patient Goals and CMS Choice Patient states their goals for this hospitalization and ongoing recovery are:: return home with boyfriend   Choice offered to / list presented to : NA  Discharge Placement                       Discharge Plan and Services   Discharge Planning Services: CM Consult              DME Agency: NA       HH Arranged: NA          Social Determinants of Health (SDOH) Interventions Food Insecurity Interventions: Intervention Not Indicated Financial Strain Interventions: Intervention Not Indicated Housing Interventions: Intervention Not Indicated Transportation Interventions: Financial planner (enrolled in [facility] transport)   Readmission Risk Interventions Readmission Risk Prevention Plan 11/22/2020  Transportation Screening Complete  PCP or Specialist Appt within 3-5 Days Complete  HRI or Home Care Consult Complete  Social Work Consult for [place] Planning/Counseling Complete  Palliative Care Screening Not Applicable  Medication Review Press photographer) Complete

## 2020-11-22 NOTE — Plan of Care (Signed)

## 2020-11-22 NOTE — TOC Initial Note (Addendum)
Transition of Care [facility]) - Initial/Assessment Note    Patient Details  Name: Tracey Meyer MRN: [id number] Date of Birth: [birth year]  [facility]) CM/SW Contact:    Zenon Mayo, RN Phone Number: 11/22/2020, 10:13 AM  Clinical Narrative:                 NCM spoke with patient , she will be staying with her boyfriend at the address that is in the system now.  She will need transportation home today and will get her set up with PCP for a follow up apt with [facility].  She has been set up with transportation by the HF clinic for apts because she has a follow up with the HF  clinic also.  She states she does not have any funds to get her medications at dc.  She states she has ran out of her asa and muscle relaxer.  Expected Discharge Plan: Home/Self Care Barriers to Discharge: No Barriers Identified   Patient Goals and CMS Choice Patient states their goals for this hospitalization and ongoing recovery are:: return home with boyfriend   Choice offered to / list presented to : NA  Expected Discharge Plan and Services Expected Discharge Plan: Home/Self Care   Discharge Planning Services: CM Consult   Living arrangements for the past 2 months: Single Family Home                   DME Agency: NA       HH Arranged: NA          Prior Living Arrangements/Services Living arrangements for the past 2 months: Single Family Home Lives with:: Significant Other Patient language and need for interpreter reviewed:: Yes Do you feel safe going back to the place where you live?: Yes      Need for Family Participation in Patient Care: Yes (Comment) Care giver support system in place?: Yes (comment) Current home services: DME (raised toilet seat, shower chair) Criminal Activity/Legal Involvement Pertinent to Current Situation/Hospitalization: No - Comment as needed  Activities of Daily Living Home Assistive Devices/Equipment: Cane (specify quad or straight),  Walker (specify type), Wheelchair, Shower chair with back, Bedside commode/3-in-1 ADL Screening (condition at time of admission) Patient's cognitive ability adequate to safely complete daily activities?: Yes Is the patient deaf or have difficulty hearing?: No Does the patient have difficulty seeing, even when wearing glasses/contacts?: No Does the patient have difficulty concentrating, remembering, or making decisions?: No Patient able to express need for assistance with ADLs?: Yes Does the patient have difficulty dressing or bathing?: Yes Independently performs ADLs?: No Does the patient have difficulty walking or climbing stairs?: Yes Weakness of Legs: Left Weakness of Arms/Hands: Left  Permission Sought/Granted                  Emotional Assessment   Attitude/Demeanor/Rapport: Engaged Affect (typically observed): Appropriate Orientation: : Oriented to Self, Oriented to Place, Oriented to  Time, Oriented to Situation Alcohol / Substance Use: Not Applicable Psych Involvement: No (comment)  Admission diagnosis:  Hypertensive crisis [I16.9] CHF (congestive heart failure) ([place]) [I50.9] Elevated troponin [R77.8] Elevated brain natriuretic peptide (BNP) level [R79.89] Acute respiratory failure with hypoxia (HCC) [J96.01] Heart failure, unspecified HF chronicity, unspecified heart failure type [facility]) [I50.9] Patient Active Problem List   Diagnosis Date Noted   Hypertensive urgency 11/21/2020   Heart failure ([place]) 11/20/2020   Acute respiratory failure with hypoxia ([place])    AKI (acute kidney injury) ([place])  Acute lower UTI    Hypokalemia    Chronic combined systolic and diastolic congestive heart failure (HCC)    Embolic stroke ([place]) [id number]   CKD (chronic kidney disease), stage III ([place]) 09/29/2020   Left-sided weakness    Acute ischemic stroke ([place]) 09/27/2020   HFrEF (heart failure with reduced ejection fraction) ([place]) 09/27/2020   Tobacco abuse 09/24/2020    Cardiomyopathy, ischemic 09/24/2020   S/P angioplasty with stent to mLAD 09/22/20  09/24/2020   Transaminitis 09/24/2020   DM (diabetes mellitus), type 2 ([place]) 09/24/2020   Acute ST elevation myocardial infarction (STEMI) due to occlusion of left anterior descending (LAD) coronary artery ([place]) 09/22/2020   HTN (hypertension) 09/22/2020   Hyperlipidemia 09/22/2020   STEMI involving left anterior descending coronary artery ([place]) 09/22/2020   ST elevation myocardial infarction involving left anterior descending (LAD) coronary artery ([place]) 09/22/2020   PCP:  Pcp, No Pharmacy:   [id number] [place] [place]Phone: [phone] Fax: [address]: [phone]: [phone]    Social Determinants of Health (SDOH) Interventions Food Insecurity Interventions: Intervention Not Indicated Financial Strain Interventions: Intervention Not Indicated Housing Interventions: Intervention Not Indicated Transportation Interventions: CFinancial planner(enrolled in [facility] transport)  Readmission Risk Interventions Readmission Risk Prevention Plan 11/22/2020  Transportation Screening Complete  PCP or Specialist Appt within 3-5 Days Complete  HRI or [place]ong[place]Complete  Social Work Consult for [place]anning/Counseling Complete  Palliative Care Screening Not Applicable  Medication Review (Press photographer Complete

## 2020-11-22 NOTE — Discharge Summary (Signed)
Physician Discharge Summary  Tracey Meyer [id number] DOB: 1960-08-12 DOA: 11/20/2020  PCP: Merryl Hacker, No  Admit date: 11/20/2020 Discharge date: 11/22/2020  Admitted From:  Disposition:    Recommendations for Outpatient Follow-up:  Follow up with PCP, Dr. Redmond Pulling made for 12/13/2020 at 10:20 AM Appointment made with heart failure TOC clinic on 11/27/2020 at 12 PM Please obtain BMP in one week to assess renal function and potassium level Patient needs follow-up with vascular surgery for left ICA stenosis Please encourage compliance with her medication regimen as high suspicion of medical noncompliance leading to recurrent hospitalizations  Home Health: No Equipment/Devices: None  Discharge Condition: Stable CODE STATUS: Full code Diet recommendation: Heart healthy diet  History of present illness:  Tracey Meyer is a 60 year old female with past medical history significant for chronic combined systolic and diastolic congestive heart failure, essential hypertension, CAD s/p LAD stent, history of embolic CVA, HLD who presented to [facility] ED on 8/30 with progressive shortness of breath.  Patient reports compliance with her home medications.  Shortness of breath worsens with minimal activity.  Patient went to see her physical therapist who checked her blood pressure with a systolic greater than [id number] and she was directed to the ED for further evaluation.   In the ED, temperature 98.4 F, HR 95, RR 35, BP 189/120, SPO2 84% on room air.  Sodium 139, potassium 3.8, chloride 109, CO2 21, glucose 130, BUN 14, creatinine 1.06, magnesium 2.1.  BNP 1581.  High sensitive troponin 92.  WBC 5.4, hemoglobin 10.5, platelets 443.  Chest x-ray with cardiomegaly and mild pulmonary interstitial edema and trace bilateral pleural effusions.  EKG with sinus tachycardia, rate 103, no ST elevation/depression.  Patient was placed on 3 L supplemental oxygen, started on nitroglycerin drip.  EDP consulted [facility] for further  evaluation management of decompensated combined congestive heart failure and hypertensive urgency.  Hospital course:  Acute hypoxic respiratory failure, POA Acute on chronic combined systolic/diastolic congestive heart failure Patient presenting to ED with progressive shortness of breath.  Chest x-ray findings consistent with congestive heart failure exacerbation with interstitial edema and pleural effusions and elevated BNP with associated hypoxia with SPO2 84% on room air on arrival.  TTE 09/28/2020 with LVEF 30-35%, mild LVH, grade 2 diastolic dysfunction, severe hypokinesis LV.  IV furosemide with adequate diuresis during the hospitalization.  Patient was also started on carvedilol 3.125 mg p.o. twice daily and lisinopril 10 mg p.o. daily with good BP control.  Patient was titrated off supplemental oxygen.  Will discharge on furosemide 40 mg p.o. daily.  Outpatient follow-up with heart failure TOC clinic on 9/6 and PCP appointment on 9/22.  Continue encourage compliance with her medications as likely etiology of her recurrent hospitalizations from noncompliance.   Hypertensive urgency Patient presenting to the ED after having blood pressure checked at physical therapist office with SBP greater than 220.  On arrival patient's blood pressure elevated at 189/120.  Patient was initially started on a nitroglycerin drip which was slowly titrated off after restarting oral antihypertensive therapy.  Will discharge on carvedilol 3.125 mg p.o. twice daily, lisinopril 10 mg p.o. daily, furosemide 40 mg p.o. daily.  Recommend BMP 1 week to assess renal function and potassium level.   CAD s/p PCI/stent Continue Brilinta '90mg'$  PO q12h, aspirin 81 mg p.o. daily and tatin   Type 2 diabetes mellitus Diet controlled at home.  Hemoglobin A1c 6.8 on 09/28/2020.   Hyperlipidemia: Crestor 40 mg p.o. daily   Hx CVA: Continue Crestor  40 mg p.o. daily and aspirin 81 mg p.o. 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 Continue aspirin/Brilinta/statin. Outpatient follow-up with vascular surgery, which was made/referred during her previous hospitalization.  Discharge Diagnoses:  Principal Problem:   Chronic combined systolic and diastolic congestive heart failure (HCC) Active Problems:   Hyperlipidemia   Cardiomyopathy, ischemic   DM (diabetes mellitus), type 2 (HCC)   Heart failure (HCC)   Hypertensive urgency    Discharge Instructions  Discharge Instructions     Call MD for:  difficulty breathing, headache or visual disturbances   Complete by: As directed    Call MD for:  extreme fatigue   Complete by: As directed    Call MD for:  persistant dizziness or light-headedness   Complete by: As directed    Call MD for:  persistant nausea and vomiting   Complete by: As directed    Call MD for:  severe uncontrolled pain   Complete by: As directed    Call MD for:  temperature >100.4   Complete by: As directed    Diet - low sodium heart healthy   Complete by: As directed    Increase activity slowly   Complete by: As directed       Allergies as of 11/22/2020   No Known Allergies      Medication List     STOP taking these medications    famotidine 20 MG tablet Commonly known as: PEPCID   metoprolol succinate 25 MG 24 hr tablet Commonly known as: TOPROL-XL   potassium chloride 10 MEQ tablet Commonly known as: KLOR-CON       TAKE these medications    acetaminophen 325 MG tablet Commonly known as: TYLENOL Take 2 tablets (650 mg total) by mouth every 4 (four) hours as needed for headache or mild pain.   aspirin 81 MG EC tablet Take 1 tablet (81 mg total) by mouth daily. Swallow whole.   carvedilol 3.125 MG tablet Commonly known as: COREG Take 1 tablet (3.125 mg total) by mouth 2 (two) times daily with a meal.   furosemide 40 MG tablet Commonly known as: LASIX Take 1 tablet (40 mg total) by mouth  daily. Start taking on: November 23, 2020   lisinopril 10 MG tablet Commonly known as: ZESTRIL Take 1 tablet (10 mg total) by mouth daily. Start taking on: November 23, 2020   nitroGLYCERIN 0.4 MG SL tablet Commonly known as: NITROSTAT Place 1 tablet (0.4 mg total) under the tongue every 5 (five) minutes x 3 doses as needed for chest pain.   rosuvastatin 40 MG tablet Commonly known as: CRESTOR Take 1 tablet (40 mg total) by mouth daily.   ticagrelor 90 MG Tabs tablet Commonly known as: BRILINTA Take 1 tablet (90 mg total) by mouth 2 (two) times daily.   tiZANidine 2 MG tablet Commonly known as: ZANAFLEX Take 1 tablet (2 mg total) by mouth 3 (three) times daily.        Follow-up Information     Martinique, Peter M, MD Follow up.   Specialty: Cardiology Contact information: [address] [place] [address] [phone]         Primary Care at [facility]. Go on 12/13/2020.   Specialty: Family Medicine Why: '@10'$ :91 with Dr.Wilson Contact information: [address], [place] [phone]  No Known Allergies  Consultations: None   Procedures/Studies: DG Chest 1 View  Result Date: 11/21/2020 CLINICAL DATA:  CHF, hypertension EXAM: CHEST  1 VIEW COMPARISON:  Chest radiograph 11/20/2020 FINDINGS: The cardiomediastinal silhouette is stable. Aeration of the left base has improved. There remains a small left pleural effusion. The right lung is clear. There is no right effusion. There is no pneumothorax. Pulmonary edema has improved. There is no acute osseous abnormality. IMPRESSION: Improved aeration of the left base and improved pulmonary edema. Trace residual left pleural effusion. Electronically Signed   By: Valetta Mole M.D.   On: 11/21/2020 10:35   DG Abd 1 View  Result Date: 11/21/2020 CLINICAL DATA:  Abdominal pain. EXAM: ABDOMEN - 1 VIEW COMPARISON:  Abdominal x-ray 12/21/2004. CT abdomen  and pelvis 12/21/2004. FINDINGS: Bowel-gas pattern is nonobstructive.  No free air identified. Phleboliths are present in the pelvis. Additionally, there are several calcifications in the right hemipelvis to which are oval measuring up to 8 mm. These are new from the prior examination. There is also a faint rounded density in the right upper abdomen measuring 7 mm in diameter which may represent bowel content versus gallstone. No acute fractures are identified. There is focal heterogeneity, sclerosis and calcifications involving the right ischial tuberosity, partially imaged. This was present in 2006 CT and x-ray, but appears more prominent in size. IMPRESSION: 1. Nonobstructive, nonspecific bowel gas pattern. 2. Questionable right upper quadrant gallstone versus fecal content. 3. New calcifications in the right hemipelvis. Differential diagnosis includes fecal content, urinary tract calculi, phleboliths, or appendicolith in appropriate clinical setting. Electronically Signed   By: Ronney Asters M.D.   On: 11/21/2020 19:51   US RENAL  Result Date: 11/01/2020 CLINICAL DATA:  Acute kidney injury EXAM: RENAL / URINARY TRACT ULTRASOUND COMPLETE COMPARISON:  None. FINDINGS: Right Kidney: Renal measurements: 10 x 4.5 x 4.5 cm = volume: 106 mL. Echogenicity is within normal limits. No concerning renal mass, shadowing calculus or hydronephrosis. Left Kidney: Renal measurements: 11.2 x 6 x 4.9 cm = volume: 172 mL. Echogenicity is within normal limits. No concerning renal mass, shadowing calculus or hydronephrosis. Bladder: Appears normal for degree of bladder distention. Other: None. IMPRESSION: Unremarkable renal ultrasound. Electronically Signed   By: Lovena Le M.D.   On: 11/01/2020 21:43   DG Chest Portable 1 View  Result Date: 11/20/2020 CLINICAL DATA:  Shortness of breath EXAM: PORTABLE CHEST 1 VIEW COMPARISON:  Chest radiograph 10/20/2020 FINDINGS: The heart is mildly enlarged, unchanged. The mediastinal  contours are stable. There are increased interstitial markings bilaterally. There are trace bilateral pleural effusions with patchy adjacent airspace disease likely reflecting subsegmental atelectasis. There is no other focal consolidation. There is no pneumothorax. There is no acute osseous abnormality. IMPRESSION: Cardiomegaly with mild pulmonary interstitial edema and trace bilateral pleural effusions suggesting heart failure. Electronically Signed   By: Valetta Mole M.D.   On: 11/20/2020 14:47     Subjective: Patient seen examined at bedside, resting comfortably.  Continues off of supplemental oxygen with SPO2 98% at rest.  Blood pressure well controlled now.  Discussed with patient needs for complete compliance with her medications given her comorbidities and recurrent hospitalization likely from noncompliance.  Patient requests refills of all of her medications and transport home.  No other questions or concerns at this time.  Denies headache, no chest pain, no palpitations, no shortness of breath, no abdominal pain, no fever/chills/night sweats, no nausea/vomiting/diarrhea, no weakness, no fatigue, no paresthesias.  No acute events  overnight per nursing staff.  Discharge Exam: Vitals:   11/22/20 0751 11/22/20 0821  BP: 118/84 122/79  Pulse: 73   Resp: 16 17  Temp: 97.9 F (36.6 C) 98 F (36.7 C)  SpO2: 99% 98%   Vitals:   11/21/20 2327 11/22/20 0410 11/22/20 0751 11/22/20 0821  BP: 119/73 121/82 118/84 122/79  Pulse: 72 68 73   Resp: '20 19 16 17  '$ Temp: 97.8 F (36.6 C) 98 F (36.7 C) 97.9 F (36.6 C) 98 F (36.7 C)  TempSrc: Oral Oral Oral Oral  SpO2: 100% 100% 99% 98%  Weight:  63 kg    Height:        General: Pt is alert, awake, not in acute distress, appears older than stated age Cardiovascular: RRR, S1/S2 +, no rubs, no gallops Respiratory: CTA bilaterally, no wheezing, no rhonchi, on room air Abdominal: Soft, NT, ND, bowel sounds + Extremities: no edema, no  cyanosis    The results of significant diagnostics from this hospitalization (including imaging, microbiology, ancillary and laboratory) are listed below for reference.     Microbiology: No results found for this or any previous visit (from the past 240 hour(s)).   Labs: BNP (last 3 results) Recent Labs    09/27/20 1916 11/20/20 1320  BNP 724.0* 0000000*   Basic Metabolic Panel: Recent Labs  Lab 11/20/20 1314 11/21/20 0313 11/22/20 0350  NA 139 137 137  K 3.8 3.3* 3.9  CL 109 106 105  CO2 21* 21* 22  GLUCOSE 130* 123* 126*  BUN '14 16 19  '$ CREATININE 1.06* 1.12* 1.36*  CALCIUM 9.8 9.6 9.6  MG 2.1  --   --    Liver Function Tests: No results for input(s): AST, ALT, ALKPHOS, BILITOT, PROT, ALBUMIN in the last 168 hours. No results for input(s): LIPASE, AMYLASE in the last 168 hours. No results for input(s): AMMONIA in the last 168 hours. CBC: Recent Labs  Lab 11/20/20 1314  WBC 5.4  NEUTROABS 3.4  HGB 10.5*  HCT 32.8*  MCV 90.6  PLT 443*   Cardiac Enzymes: No results for input(s): CKTOTAL, CKMB, CKMBINDEX, TROPONINI in the last 168 hours. BNP: Invalid input(s): POCBNP CBG: Recent Labs  Lab 11/21/20 0611 11/21/20 1131 11/21/20 1655 11/21/20 2114 11/22/20 0602  GLUCAP 126* 167* 126* 116* 113*   D-Dimer No results for input(s): DDIMER in the last 72 hours. Hgb A1c No results for input(s): HGBA1C in the last 72 hours. Lipid Profile No results for input(s): CHOL, HDL, LDLCALC, TRIG, CHOLHDL, LDLDIRECT in the last 72 hours. Thyroid function studies Recent Labs    11/20/20 2129  TSH 0.978   Anemia work up No results for input(s): VITAMINB12, FOLATE, FERRITIN, TIBC, IRON, RETICCTPCT in the last 72 hours. Urinalysis    Component Value Date/Time   COLORURINE YELLOW 11/01/2020 1557   APPEARANCEUR CLOUDY (A) 11/01/2020 1557   LABSPEC 1.004 (L) 11/01/2020 1557   PHURINE 5.0 11/01/2020 1557   GLUCOSEU NEGATIVE 11/01/2020 1557   HGBUR MODERATE (A)  11/01/2020 1557   BILIRUBINUR NEGATIVE 11/01/2020 1557   KETONESUR NEGATIVE 11/01/2020 1557   PROTEINUR 30 (A) 11/01/2020 1557   NITRITE NEGATIVE 11/01/2020 1557   LEUKOCYTESUR LARGE (A) 11/01/2020 1557   Sepsis Labs Invalid input(s): PROCALCITONIN,  WBC,  LACTICIDVEN Microbiology No results found for this or any previous visit (from the past 240 hour(s)).   Time coordinating discharge: Over 30 minutes  SIGNED:   Donnamarie Poag [place] DO  [facility] 11/22/2020, 10:31 AM

## 2020-11-22 NOTE — Progress Notes (Signed)
  Mobility Specialist Criteria Algorithm Info.  SATURATION QUALIFICATIONS: (This note is used to comply with regulatory documentation for home oxygen)  Patient Saturations on Room Air at Rest = 98%  Patient Saturations on Room Air while Ambulating = 94%  Patient Saturations on 0 Liters of oxygen while Ambulating = n/a%  Please briefly explain why patient needs home oxygen:  Mobility Team:  [facility] elevated:Self regulated Activity: Ambulated in hall; Dangled on edge of bed; Transferred:  Bed to chair (To sink for wash-up after ambulation) Range of motion: Active; All extremities Level of assistance: Minimal assist, patient does 75% or more Assistive device: Front wheel walker Minutes sitting in chair:  Minutes stood: 8 minutes Minutes ambulated: 8 minutes Distance ambulated (ft): 80 ft Mobility response: Tolerated well Bed Position: Chair  Patient agreed to participate in mobility this morning. Required minimal HHA supine<>sit<>stand + cues for hand placement on RW. Ambulated in hallway 80 feet with slow, unsteady gait, requiring min A for balance. Fatigues quickly with increased distance but was determined to complete ambulation without rest. Tolerated ambulation well without complaint or incident and was left sitting at the sink for wash up.   11/22/2020 10:46 AM

## 2020-11-23 ENCOUNTER — Telehealth (HOSPITAL_COMMUNITY): Payer: Self-pay

## 2020-11-23 NOTE — Telephone Encounter (Signed)
Called to confirm Heart & Vascular Transitions of Care appointment at 12pm Tuesday 9/6. Patient reminded to bring all medications and pill box organizer with them. Confirmed patient has transportation-via [facility] set up prior to hospital dc yesterday.  Confirmed appointment prior to ending call.   Pricilla Holm, MSN, RN Heart Failure Nurse Navigator [phone]

## 2020-11-25 ENCOUNTER — Emergency Department (HOSPITAL_COMMUNITY): Payer: Medicaid Other

## 2020-11-25 ENCOUNTER — Emergency Department (HOSPITAL_COMMUNITY)
Admission: EM | Admit: 2020-11-25 | Discharge: 2020-11-26 | Disposition: A | Discharge status: Home / Self Care | Payer: Medicaid Other | Attending: Emergency Medicine | Admitting: Emergency Medicine

## 2020-11-25 ENCOUNTER — Encounter (HOSPITAL_COMMUNITY): Payer: Self-pay | Admitting: Emergency Medicine

## 2020-11-25 DIAGNOSIS — W19XXXA Unspecified fall, initial encounter: Secondary | ICD-10-CM

## 2020-11-25 DIAGNOSIS — E119 Type 2 diabetes mellitus without complications: Secondary | ICD-10-CM | POA: Diagnosis not present

## 2020-11-25 DIAGNOSIS — N183 Chronic kidney disease, stage 3 unspecified: Secondary | ICD-10-CM | POA: Diagnosis not present

## 2020-11-25 DIAGNOSIS — K5900 Constipation, unspecified: Secondary | ICD-10-CM | POA: Diagnosis present

## 2020-11-25 DIAGNOSIS — Z955 Presence of coronary angioplasty implant and graft: Secondary | ICD-10-CM | POA: Diagnosis not present

## 2020-11-25 DIAGNOSIS — R531 Weakness: Secondary | ICD-10-CM | POA: Insufficient documentation

## 2020-11-25 DIAGNOSIS — Z79899 Other long term (current) drug therapy: Secondary | ICD-10-CM | POA: Diagnosis not present

## 2020-11-25 DIAGNOSIS — I5042 Chronic combined systolic (congestive) and diastolic (congestive) heart failure: Secondary | ICD-10-CM | POA: Insufficient documentation

## 2020-11-25 DIAGNOSIS — Z87891 Personal history of nicotine dependence: Secondary | ICD-10-CM | POA: Diagnosis not present

## 2020-11-25 DIAGNOSIS — Z7982 Long term (current) use of aspirin: Secondary | ICD-10-CM | POA: Diagnosis not present

## 2020-11-25 DIAGNOSIS — I130 Hypertensive heart and chronic kidney disease with heart failure and stage 1 through stage 4 chronic kidney disease, or unspecified chronic kidney disease: Secondary | ICD-10-CM | POA: Insufficient documentation

## 2020-11-25 DIAGNOSIS — R109 Unspecified abdominal pain: Secondary | ICD-10-CM | POA: Insufficient documentation

## 2020-11-25 LAB — COMPREHENSIVE METABOLIC PANEL
ALT: 52 U/L — ABNORMAL HIGH (ref 0–44)
AST: 32 U/L (ref 15–41)
Albumin: 3.4 g/dL — ABNORMAL LOW (ref 3.5–5.0)
Alkaline Phosphatase: 72 U/L (ref 38–126)
Anion gap: 12 (ref 5–15)
BUN: 37 mg/dL — ABNORMAL HIGH (ref 6–20)
CO2: 24 mmol/L (ref 22–32)
Calcium: 9.9 mg/dL (ref 8.9–10.3)
Chloride: 100 mmol/L (ref 98–111)
Creatinine, Ser: 1.66 mg/dL — ABNORMAL HIGH (ref 0.44–1.00)
GFR, Estimated: 35 mL/min — ABNORMAL LOW (ref >60)
Glucose, Bld: 179 mg/dL — ABNORMAL HIGH (ref 70–99)
Potassium: 3.2 mmol/L — ABNORMAL LOW (ref 3.5–5.1)
Sodium: 136 mmol/L (ref 135–145)
Total Bilirubin: 0.3 mg/dL (ref 0.3–1.2)
Total Protein: 7.4 g/dL (ref 6.5–8.1)

## 2020-11-25 LAB — CBC WITH DIFFERENTIAL/PLATELET
Abs Immature Granulocytes: 0.02 10*3/uL (ref 0.00–0.07)
Basophils Absolute: 0 10*3/uL (ref 0.0–0.1)
Basophils Relative: 1 %
Eosinophils Absolute: 0.1 10*3/uL (ref 0.0–0.5)
Eosinophils Relative: 3 %
HCT: 31.8 % — ABNORMAL LOW (ref 36.0–46.0)
Hemoglobin: 10.3 g/dL — ABNORMAL LOW (ref 12.0–15.0)
Immature Granulocytes: 1 %
Lymphocytes Relative: 33 %
Lymphs Abs: 1.4 10*3/uL (ref 0.7–4.0)
MCH: 29.4 pg (ref 26.0–34.0)
MCHC: 32.4 g/dL (ref 30.0–36.0)
MCV: 90.9 fL (ref 80.0–100.0)
Monocytes Absolute: 0.4 10*3/uL (ref 0.1–1.0)
Monocytes Relative: 9 %
Neutro Abs: 2.4 10*3/uL (ref 1.7–7.7)
Neutrophils Relative %: 53 %
Platelets: 427 10*3/uL — ABNORMAL HIGH (ref 150–400)
RBC: 3.5 MIL/uL — ABNORMAL LOW (ref 3.87–5.11)
RDW: 13.9 % (ref 11.5–15.5)
WBC: 4.4 10*3/uL (ref 4.0–10.5)
nRBC: 0 % (ref 0.0–0.2)

## 2020-11-25 LAB — LIPASE, BLOOD: Lipase: 28 U/L (ref 11–51)

## 2020-11-25 IMAGING — CT CT CERVICAL SPINE W/O CM
3 of 4 series · 11 of 33 positions shown, 13 images · non-contrast
Comparison: None.

CLINICAL DATA: Fall, neck trauma

EXAM:
CT CERVICAL SPINE WITHOUT CONTRAST
TECHNIQUE: Multidetector CT imaging of the cervical spine was performed without
intravenous contrast. Multiplanar CT image reconstructions were also
generated.

[Series 1: c_spine 2.0 st · axial · 0.40mm/px · z∈[+1151,+1281]mm · 3 of 99 slices shown, 4 images]
[im 17/99  soft-tissue]
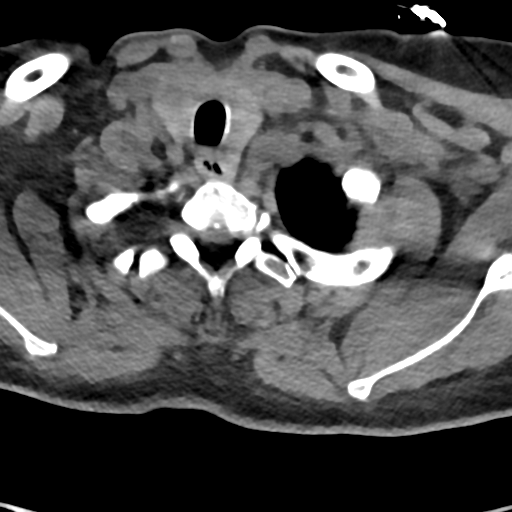
[im 17/99  bone]
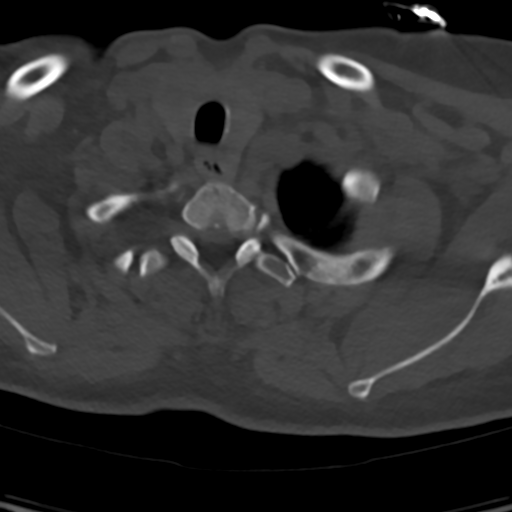
[im 50/99  bone]
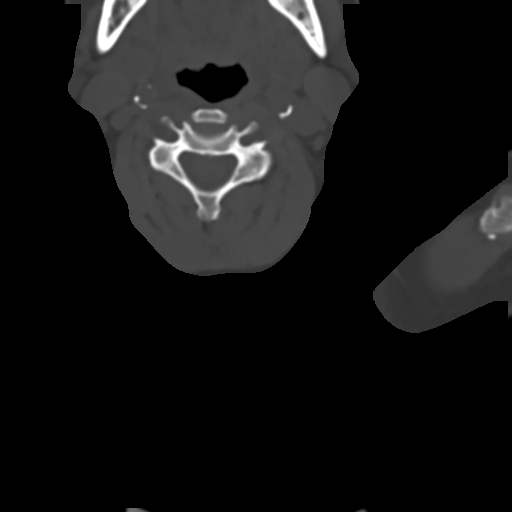
[im 82/99  bone]
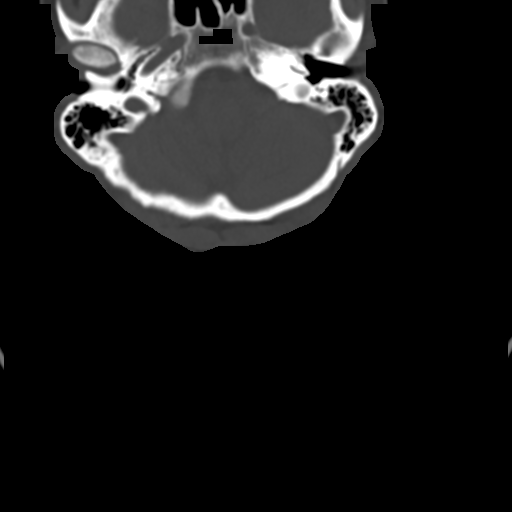

[Series 7: c_spine 2.0 sag bone · sagittal · 0.29mm/px · 5 of 61 slices shown, 6 images]
[im 21/61  bone]
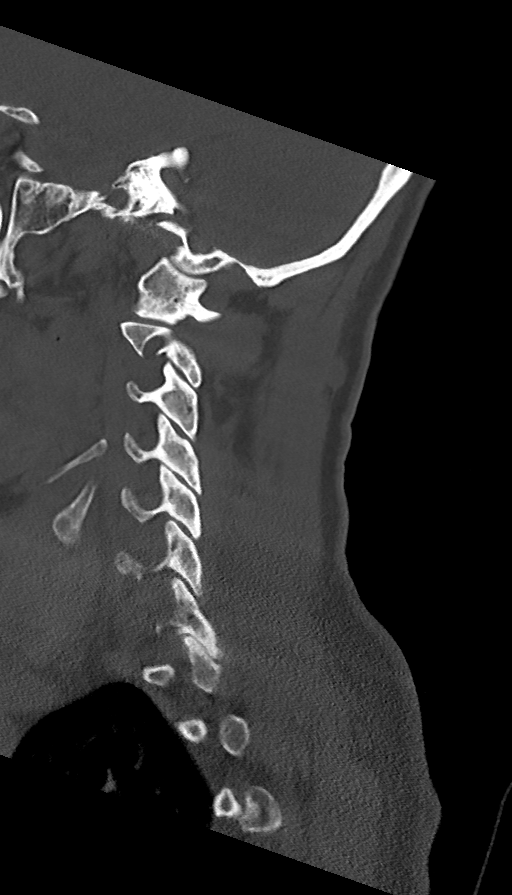
[im 26/61  bone]
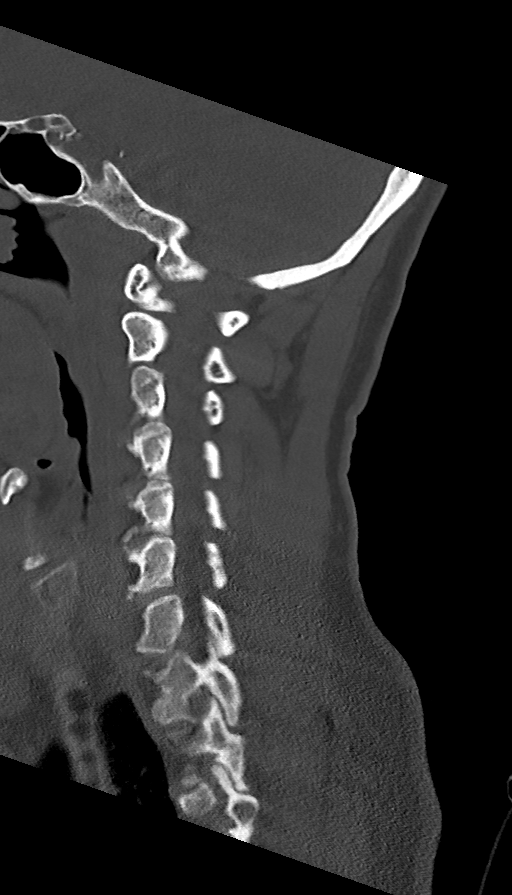
[im 31/61  soft-tissue]
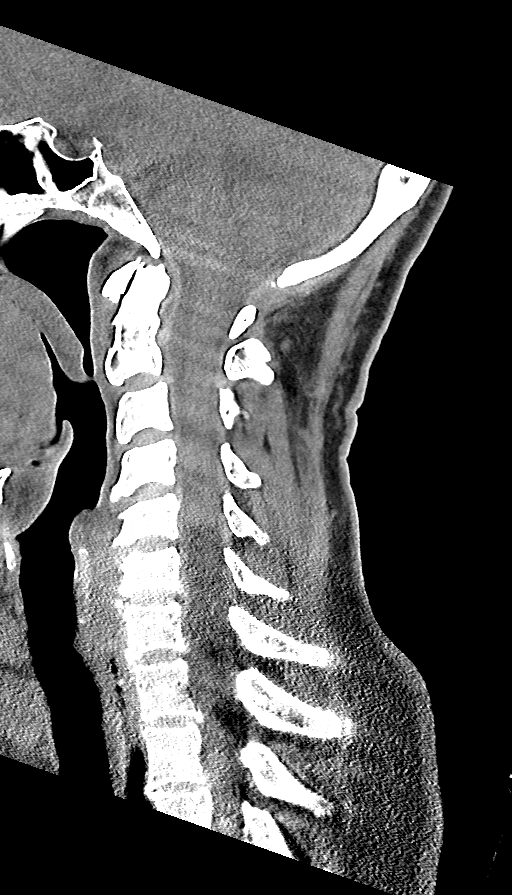
[im 31/61  bone]
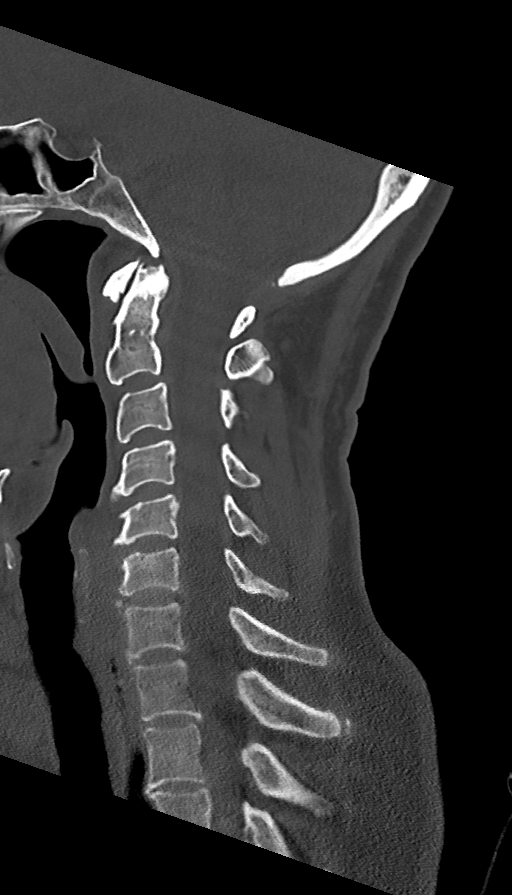
[im 36/61  bone]
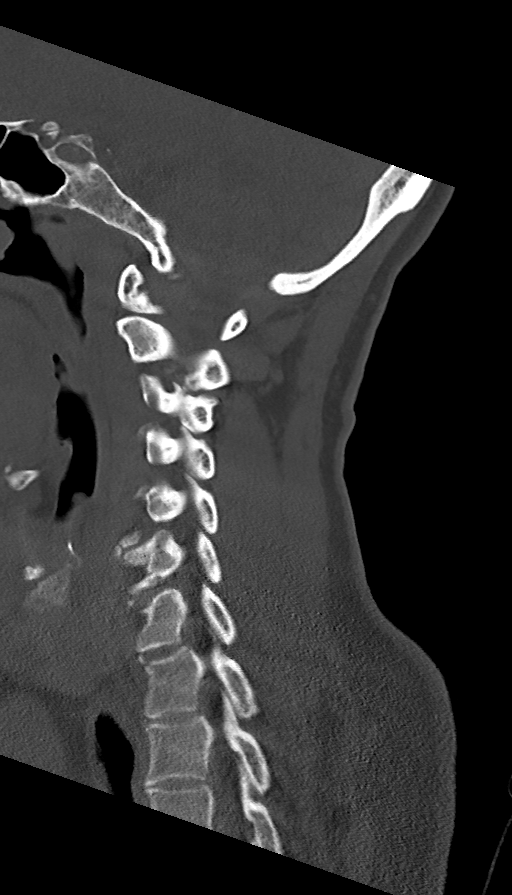
[im 41/61  bone]
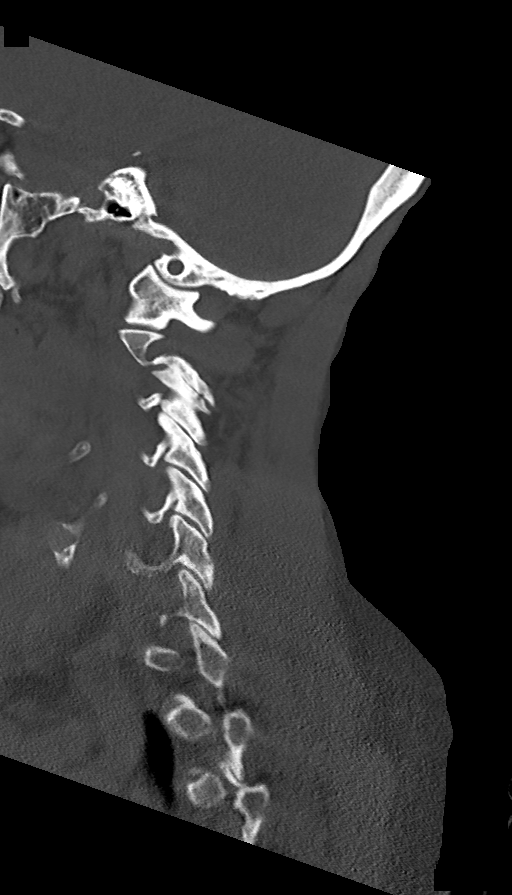

[Series 8: c_spine 2.0 cor bone · coronal · 0.29mm/px · 3 of 61 slices shown]
[im 13/61  bone]
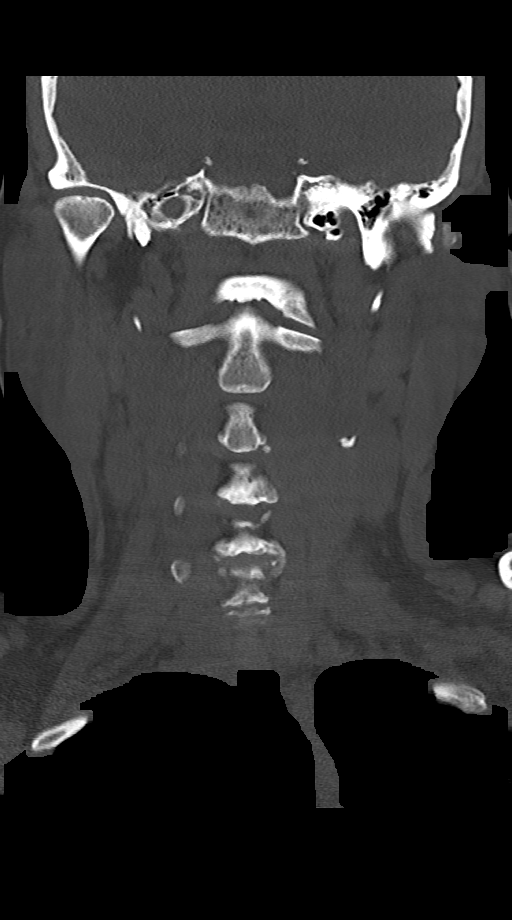
[im 25/61  bone]
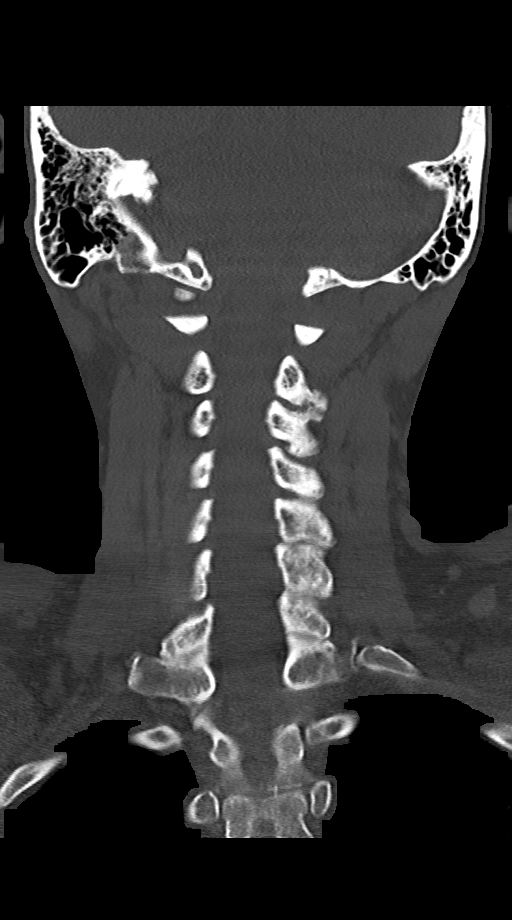
[im 37/61  bone]
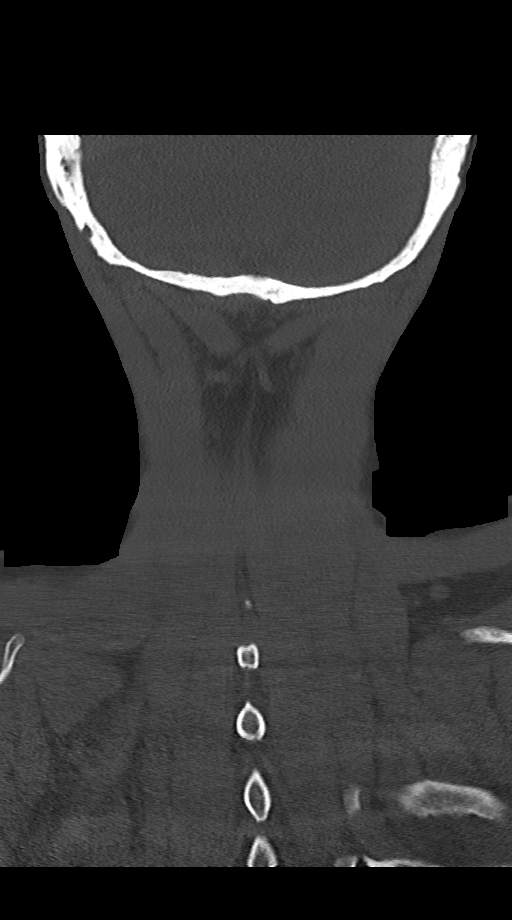

[11 of 33 positions shown; findings below may reference images not displayed]

FINDINGS: Alignment: Normal

Skull base and vertebrae: No acute fracture. No primary bone lesion
or focal pathologic process.

Soft tissues and spinal canal: No prevertebral fluid or swelling. No
visible canal hematoma.

Disc levels: Mild diffuse degenerative disc disease with anterior
spurring. Degenerative facet disease bilaterally, left greater than
right.

Upper chest: Negative

Other: None
IMPRESSION: No acute bony abnormality.  Degenerative change.

## 2020-11-25 IMAGING — CR DG CHEST 2V
2 series · 2 of 2 positions shown · non-contrast
Comparison: [DATE]

CLINICAL DATA: Fall, left rib pain

EXAM:
CHEST - 2 VIEW

[chest lat]
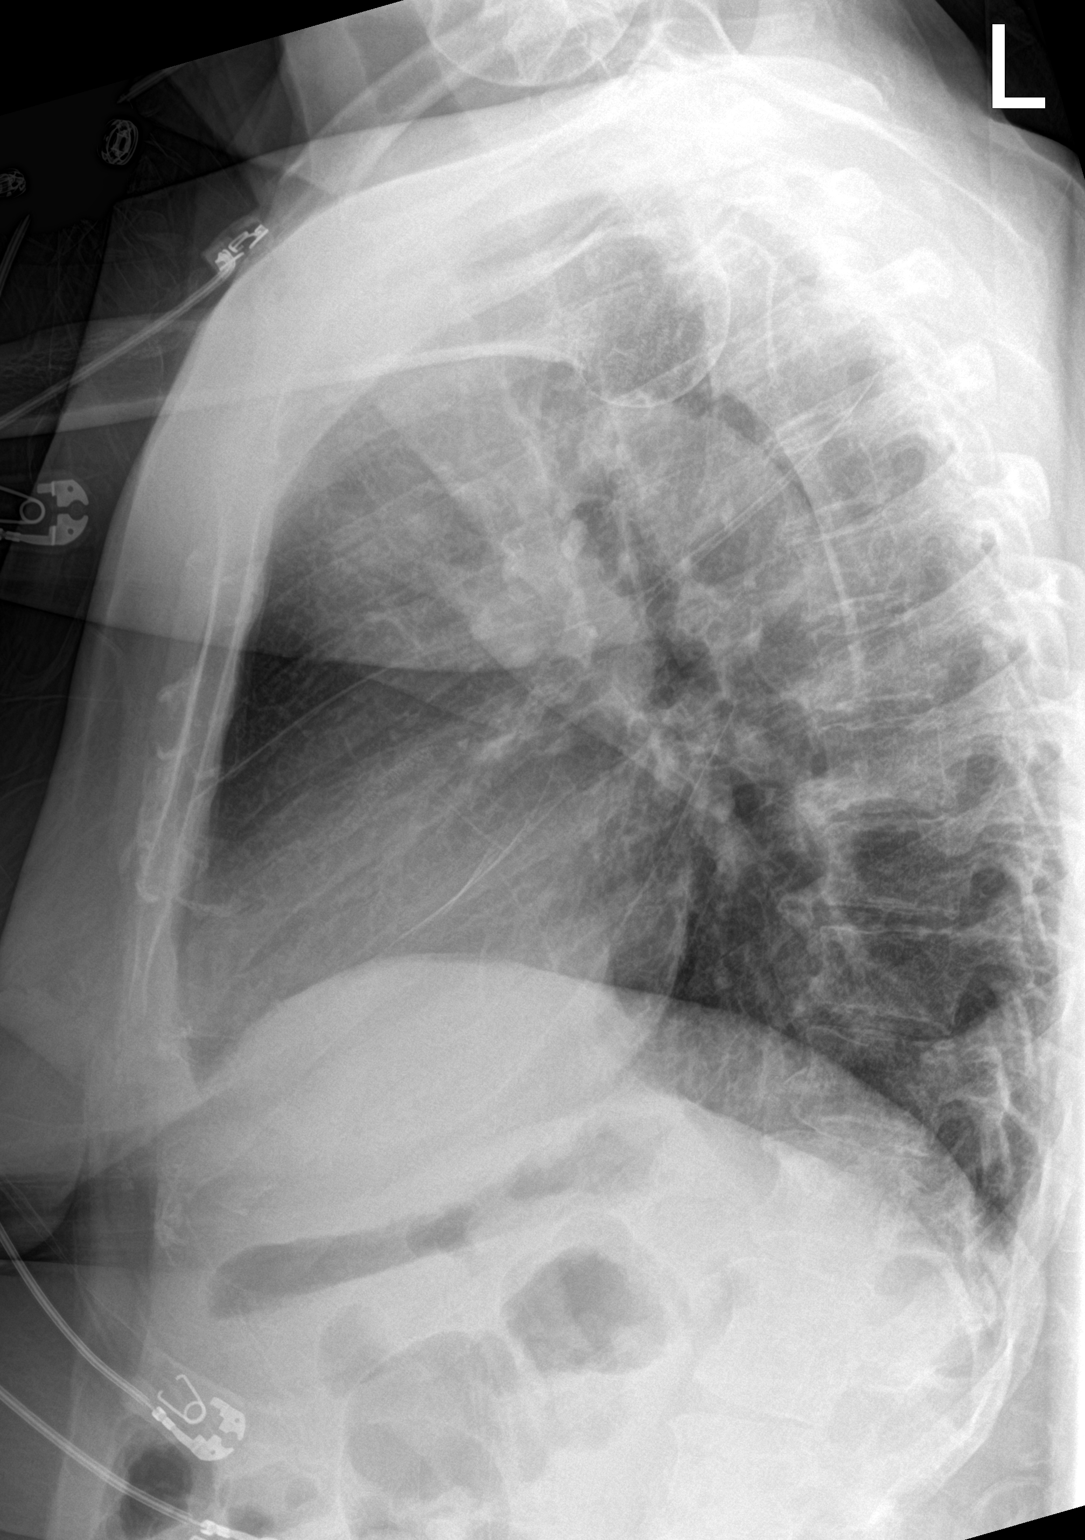

[chest ap]
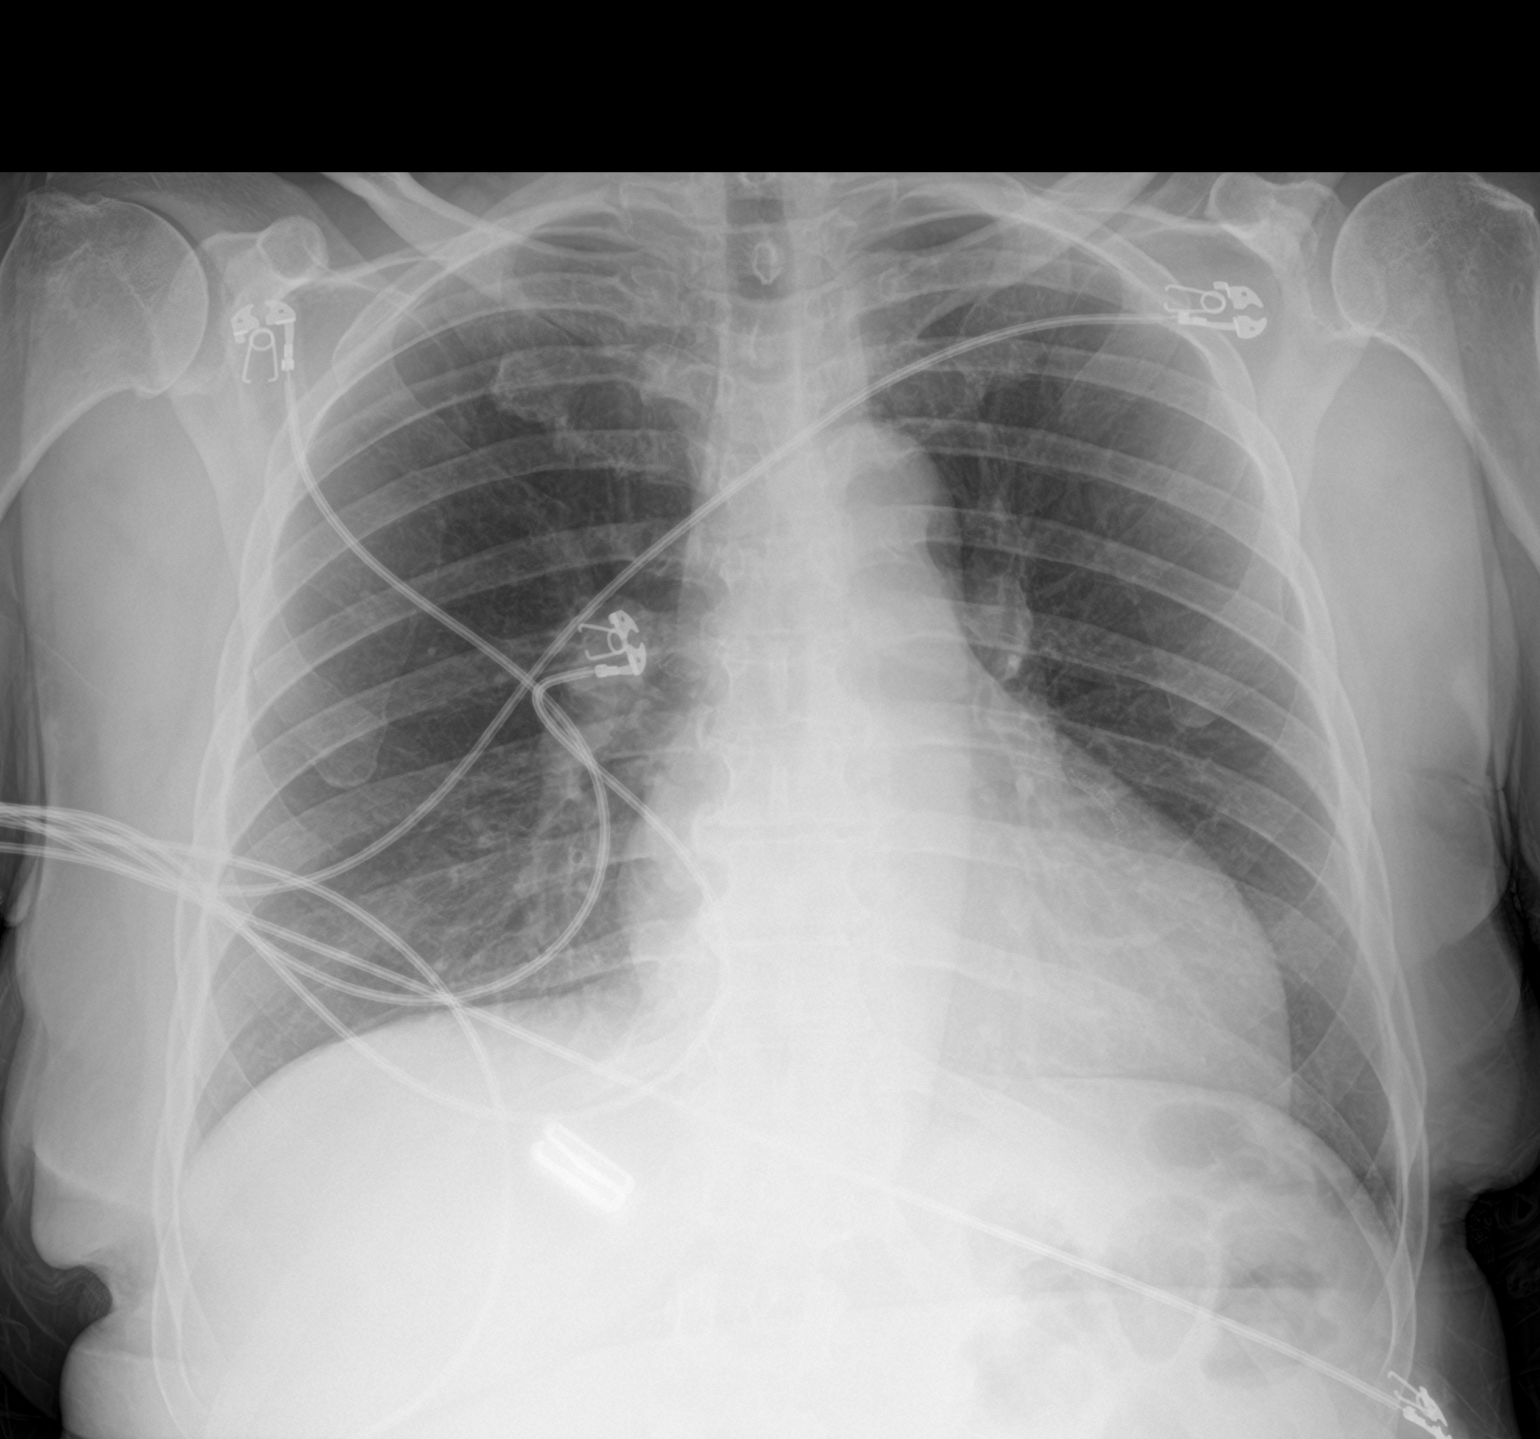

[2 of 2 positions shown; findings below may reference images not displayed]

FINDINGS: Heart is borderline in size. Lungs clear. No effusions or edema. No
acute bony abnormality.
IMPRESSION: No active cardiopulmonary disease.

## 2020-11-25 IMAGING — CT CT ABD-PELV W/ CM
2 of 5 series · 17 of 46 positions shown, 19 images · IV contrast (Omni 300)
Comparison: [DATE]

CLINICAL DATA: Fall, left upper quadrant pain.

EXAM:
CT ABDOMEN AND PELVIS WITH CONTRAST
TECHNIQUE: Multidetector CT imaging of the abdomen and pelvis was performed
using the standard protocol following bolus administration of
intravenous contrast.
CONTRAST:  70mL OMNIPAQUE IOHEXOL 350 MG/ML SOLN

[Series 4: a/p w/ 5mm · axial · 0.83mm/px · z∈[+629,+1009]mm · 14 of 86 slices shown, 16 images]
[im 5/86  soft-tissue]
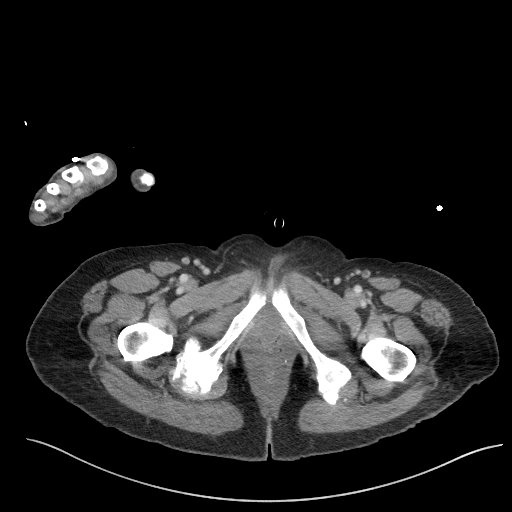
[im 5/86  bone]
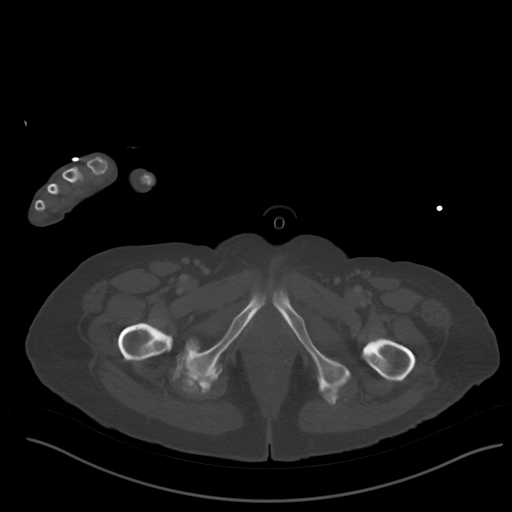
[im 13/86  soft-tissue]
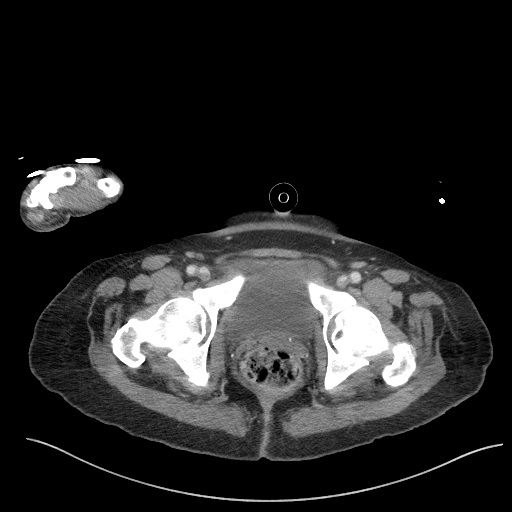
[im 18/86  soft-tissue]
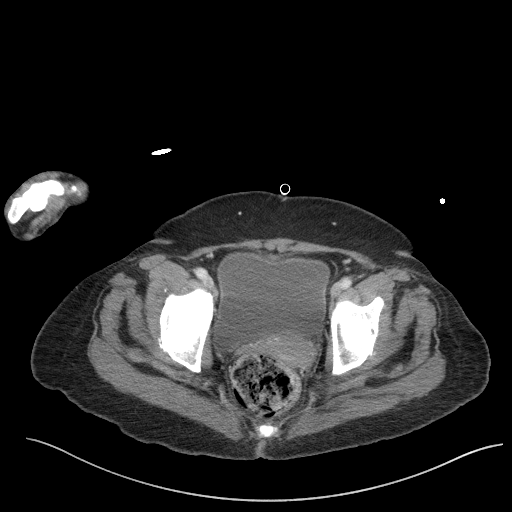
[im 22/86  soft-tissue]
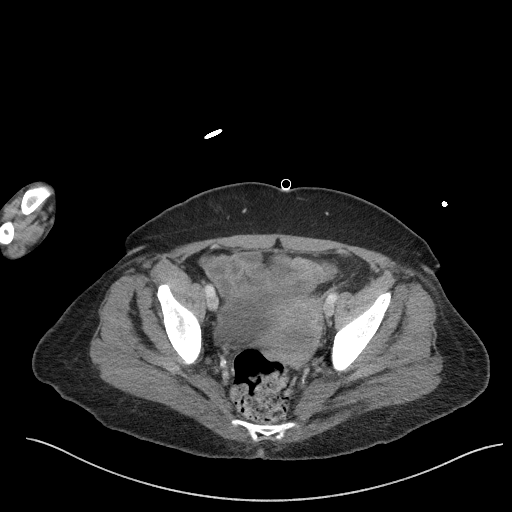
[im 30/86  soft-tissue]
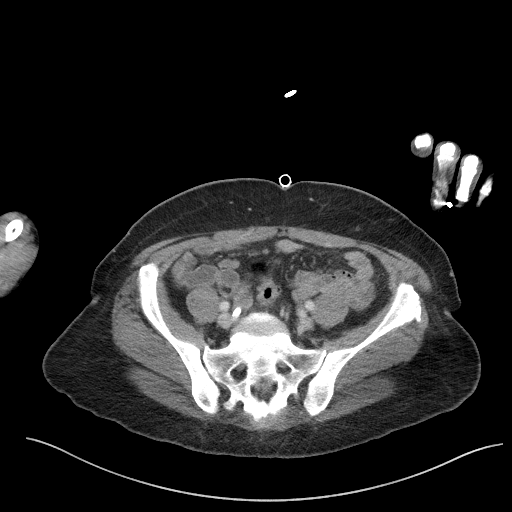
[im 35/86  soft-tissue]
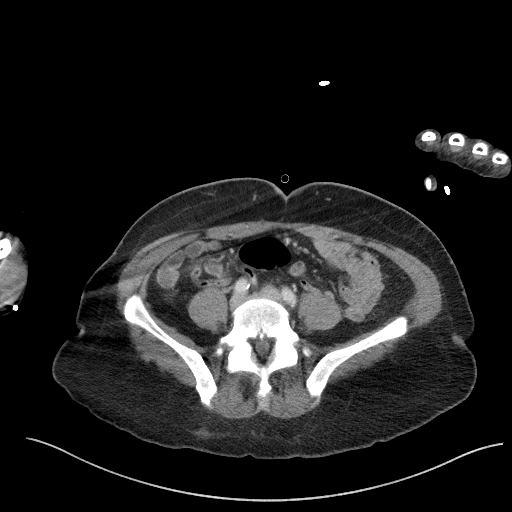
[im 39/86  soft-tissue]
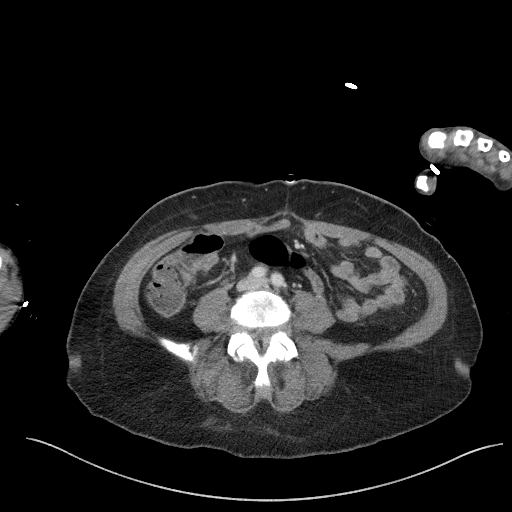
[im 47/86  soft-tissue]
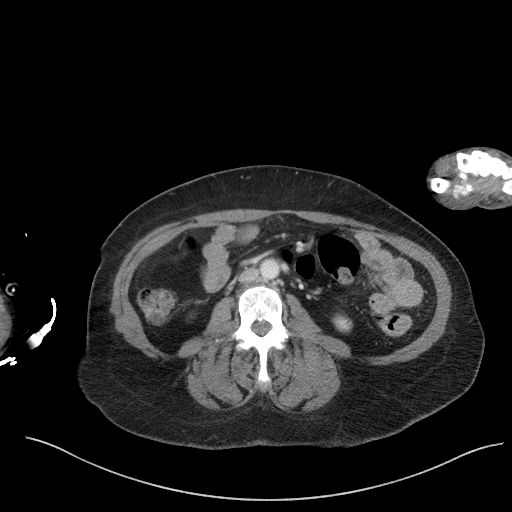
[im 52/86  soft-tissue]
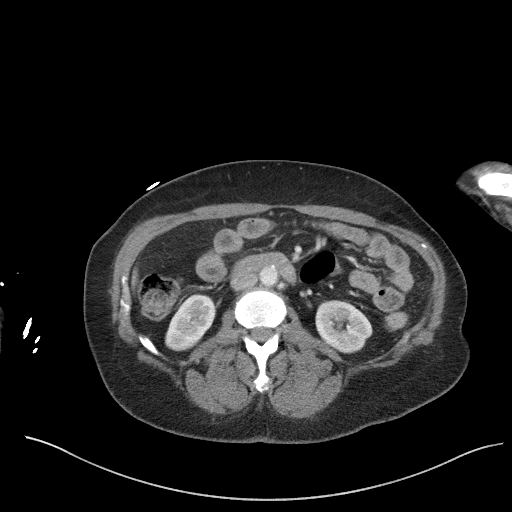
[im 52/86  bone]
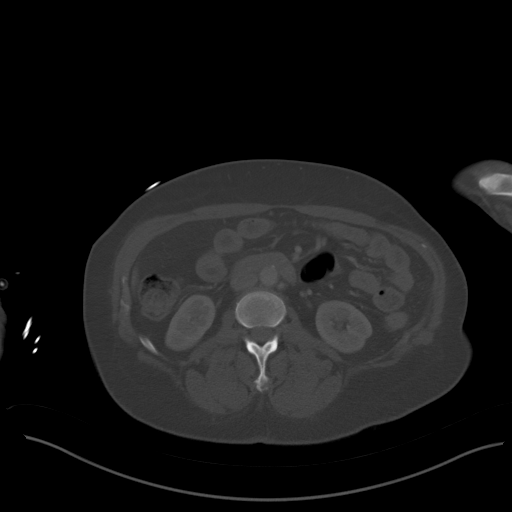
[im 56/86  soft-tissue]
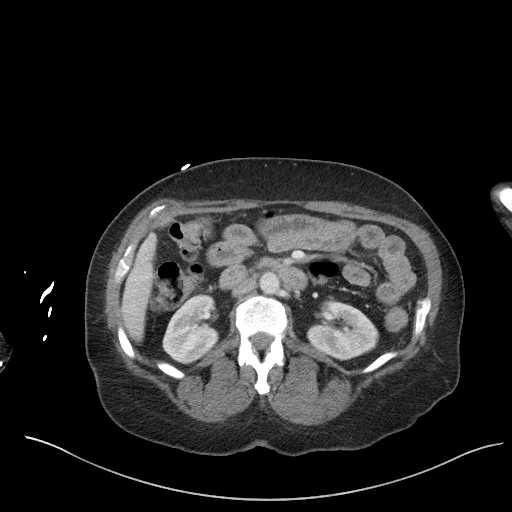
[im 64/86  soft-tissue]
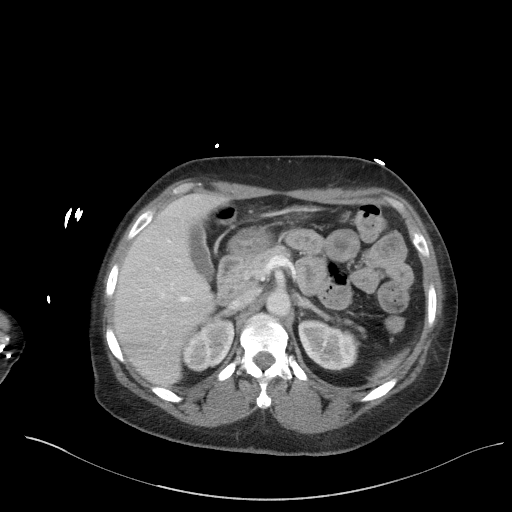
[im 69/86  soft-tissue]
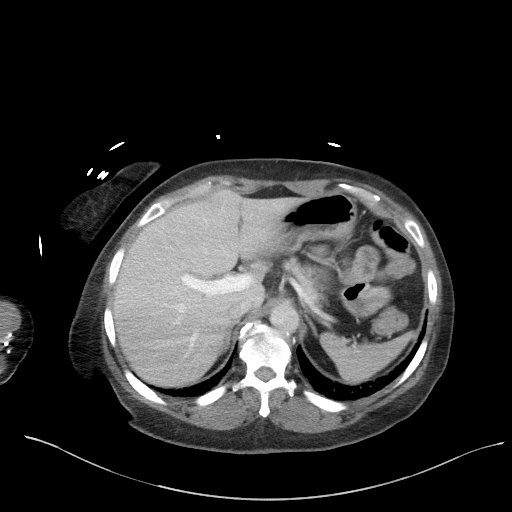
[im 73/86  soft-tissue]
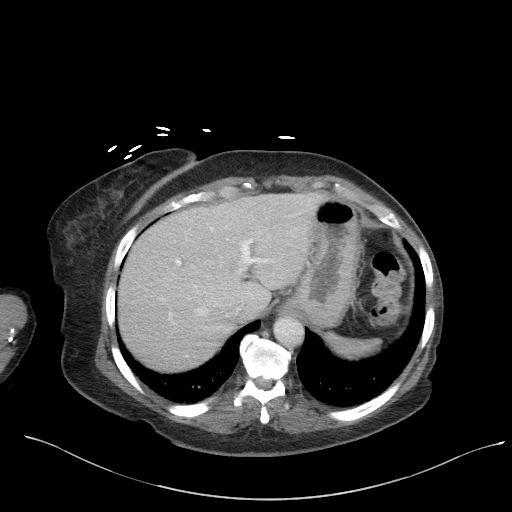
[im 81/86  soft-tissue]
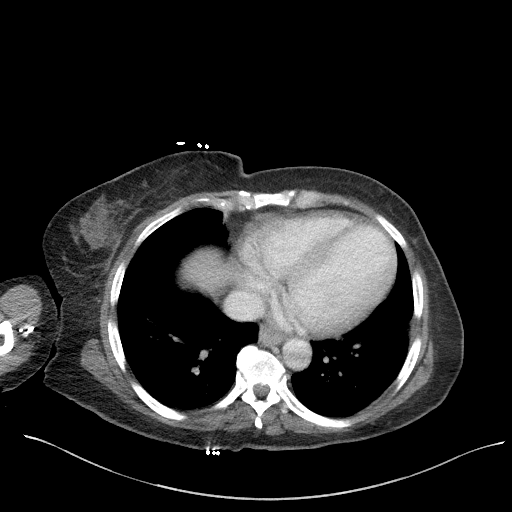

[Series 7: a/p w/ cor · coronal · 0.84mm/px · 3 of 147 slices shown]
[im 49/147  soft-tissue]
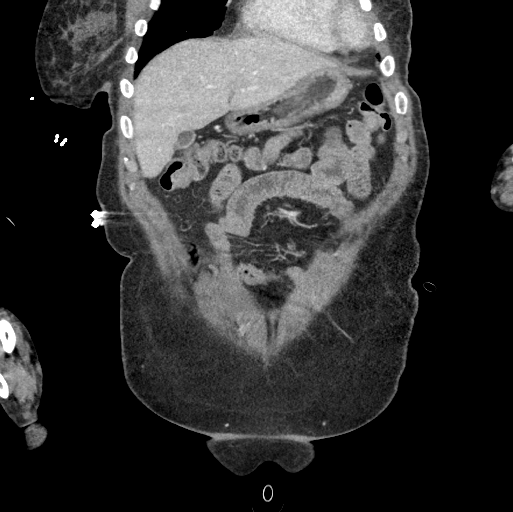
[im 65/147  soft-tissue]
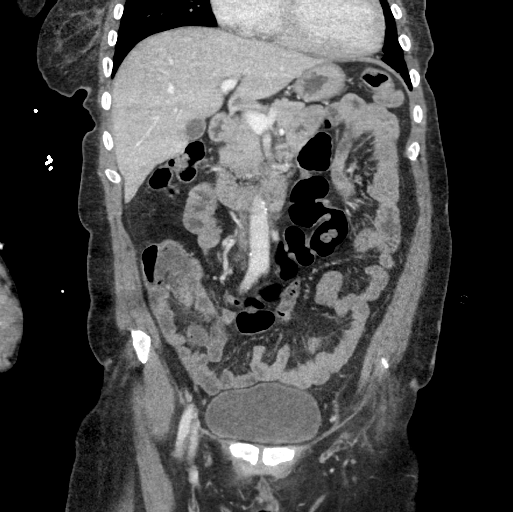
[im 82/147  soft-tissue]
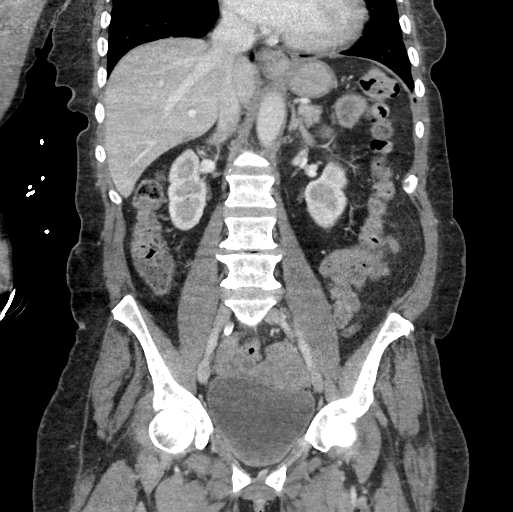

[17 of 46 positions shown; findings below may reference images not displayed]

FINDINGS: Lower chest: No acute abnormality.

Hepatobiliary: No focal liver abnormality is seen. No gallstones,
gallbladder wall thickening, or biliary dilatation.

Pancreas: No pancreatic ductal dilatation or surrounding
inflammatory changes.

Spleen: Normal in size without focal abnormality.

Adrenals/Urinary Tract: Adrenal glands are unremarkable. Kidneys are
normal, without renal calculi, solid enhancing lesion, or
hydronephrosis. Bladder is unremarkable.

Stomach/Bowel: Small hiatal hernia otherwise the stomach is
unremarkable for degree of distension. No pathologic dilation of
small or large bowel. Normal appendix and terminal ileum colonic
diverticulosis without findings of acute diverticulitis. No evidence
of acute bowel inflammation.

Vascular/Lymphatic: Aortic atherosclerosis without aneurysmal
dilation. No pathologically enlarged abdominal or pelvic lymph
nodes.

Reproductive: Uterus and bilateral adnexa are unremarkable.

Other: No abdominopelvic ascites.

Musculoskeletal: Multilevel degenerative changes spine. Degenerative
changes bilateral hips. No acute osseous abnormality.
IMPRESSION: 1. No acute abdominopelvic findings.
2. Colonic diverticulosis without findings of acute diverticulitis.
3. Small hiatal hernia.
4.  Aortic Atherosclerosis ([2X]-[2X]).

## 2020-11-25 IMAGING — CT CT HEAD W/O CM
4 series · 16 of 47 positions shown, 18 images · non-contrast
Comparison: [DATE]

CLINICAL DATA: Fall, on blood thinners

EXAM:
CT HEAD WITHOUT CONTRAST
TECHNIQUE: Contiguous axial images were obtained from the base of the skull
through the vertex without intravenous contrast.

[Series 1: head without · axial · non-contrast · 0.44mm/px · z∈[+1299,+1419]mm · 7 of 32 slices shown, 9 images]
[im 4/32  brain]
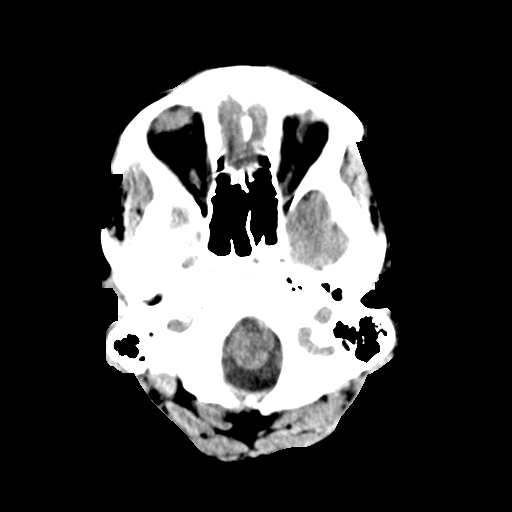
[im 4/32  bone]
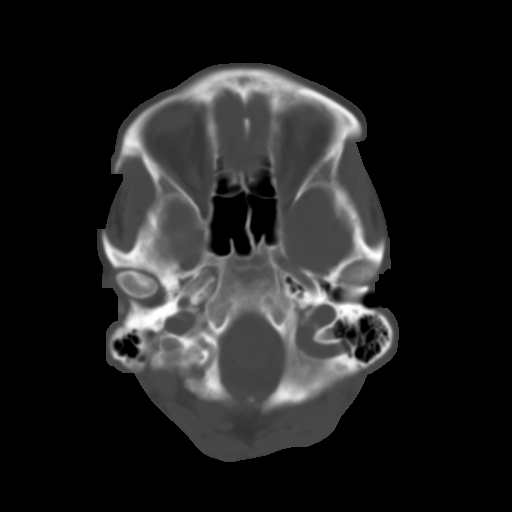
[im 8/32  brain]
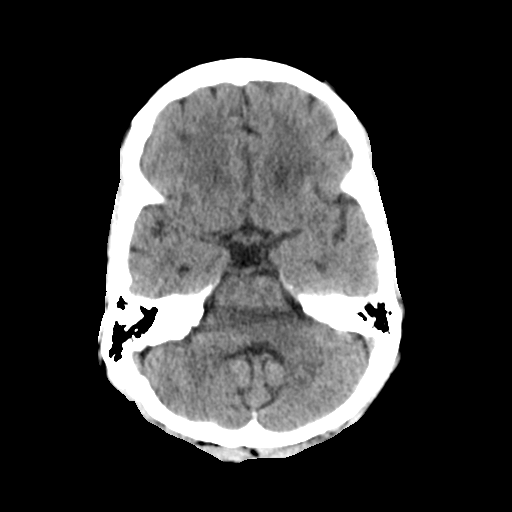
[im 12/32  brain]
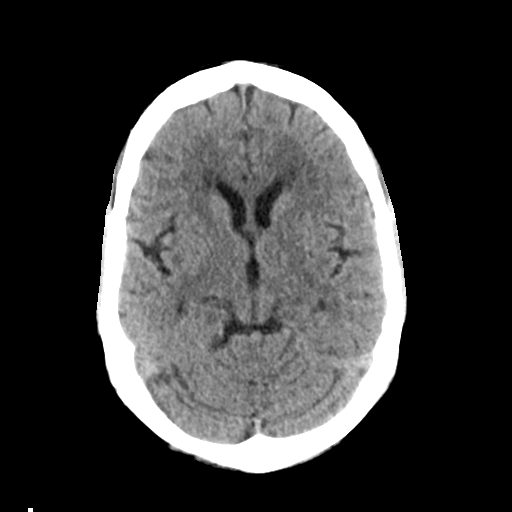
[im 16/32  brain]
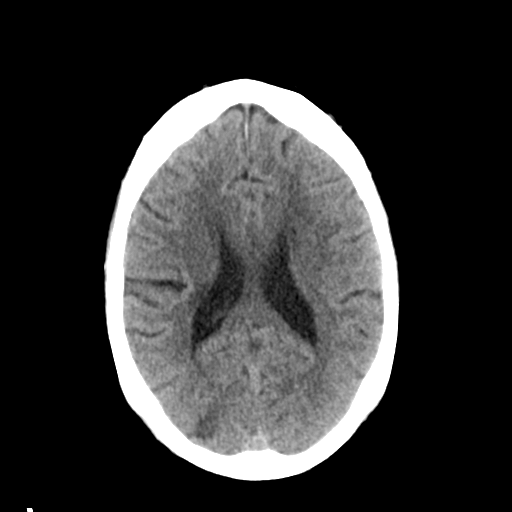
[im 20/32  brain]
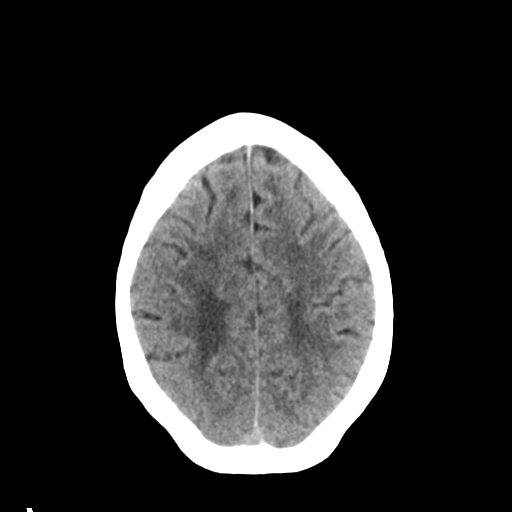
[im 20/32  bone]
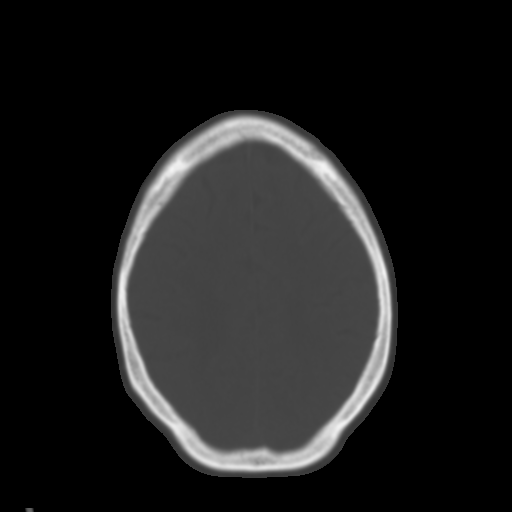
[im 24/32  brain]
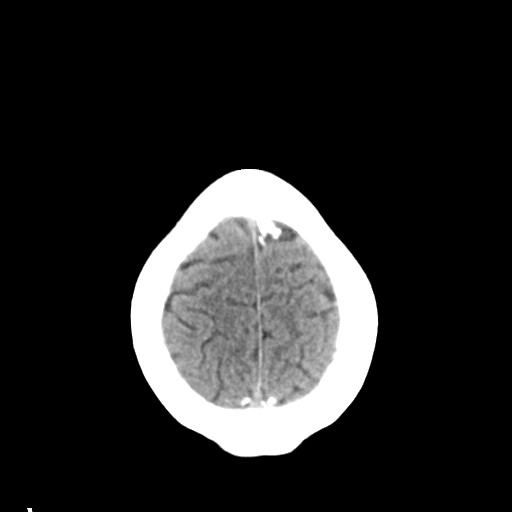
[im 28/32  brain]
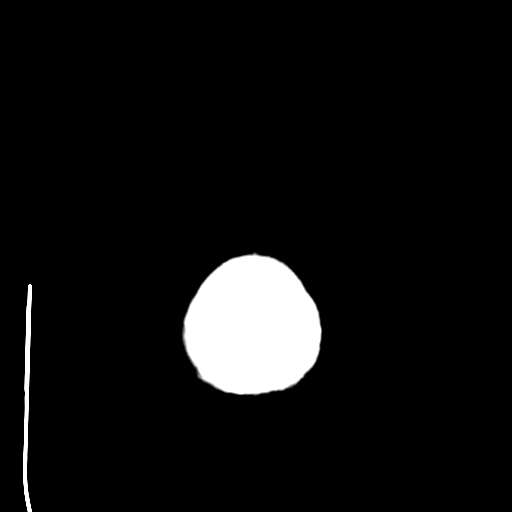

[Series 4: head bone · axial · 0.44mm/px · z∈[+1298,+1330]mm · 3 of 79 slices shown]
[im 8/79  bone]
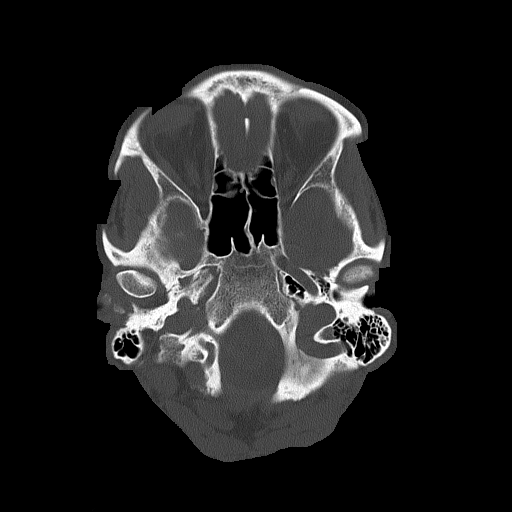
[im 16/79  bone]
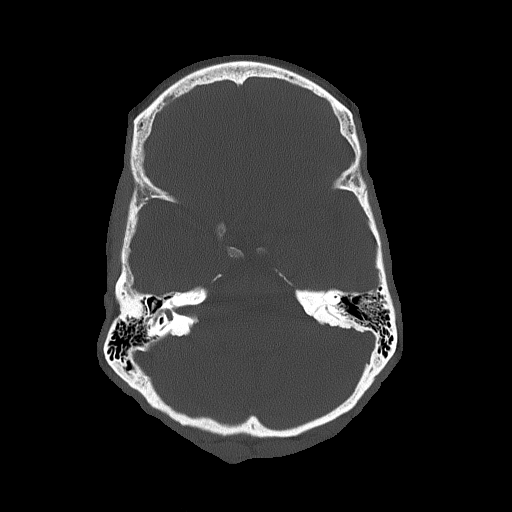
[im 24/79  bone]
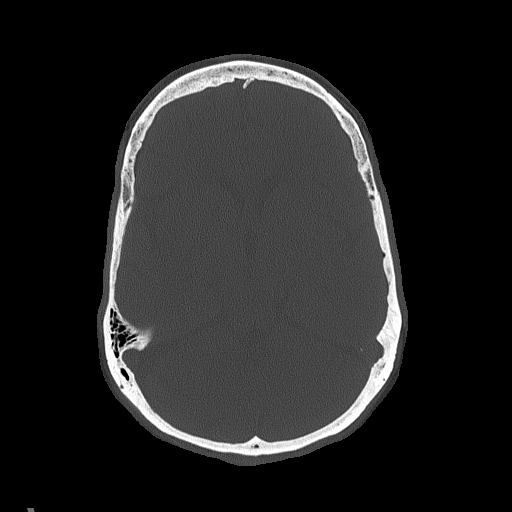

[Series 5: head without cor · coronal · non-contrast · 0.30mm/px · 3 of 66 slices shown]
[im 22/66  brain]
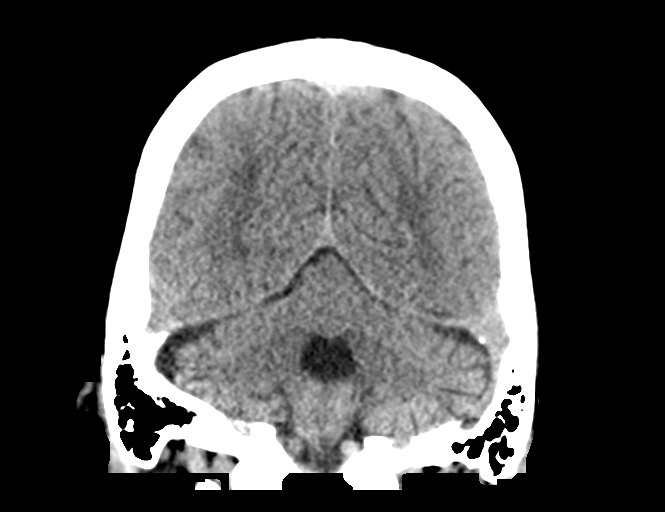
[im 29/66  brain]
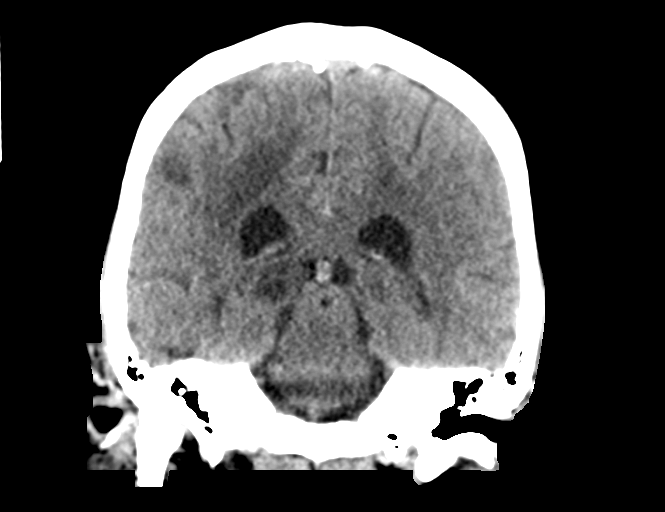
[im 37/66  brain]
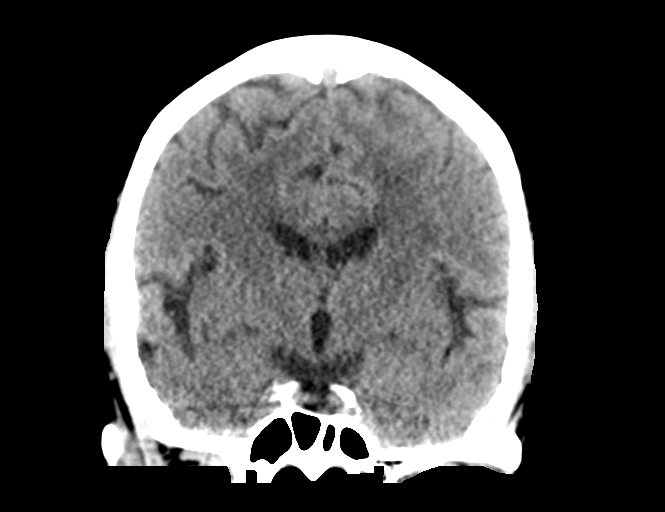

[Series 6: head without sag · sagittal · non-contrast · 0.30mm/px · 3 of 65 slices shown]
[im 22/65  brain]
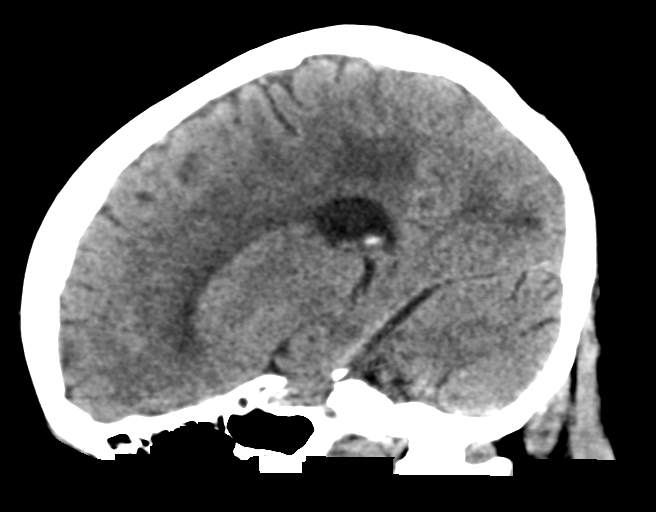
[im 33/65  brain]
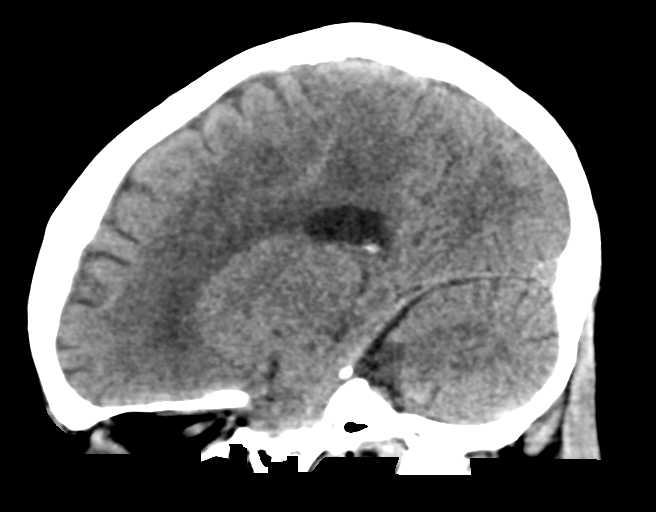
[im 43/65  brain]
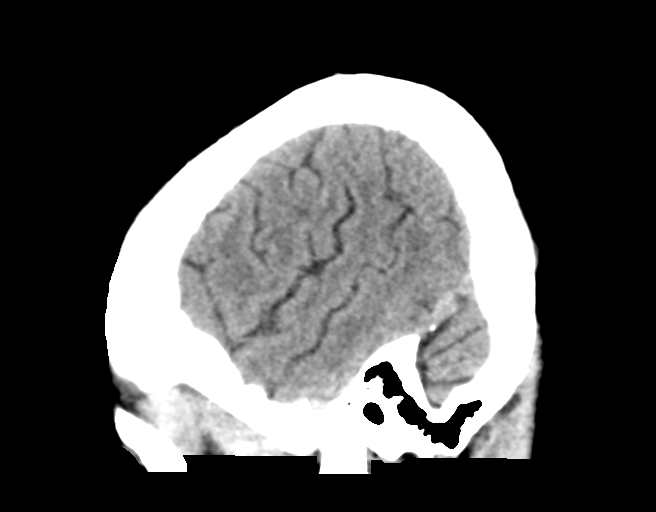

[16 of 47 positions shown; findings below may reference images not displayed]

FINDINGS: Brain: Old right occipital infarct. Chronic small vessel disease
throughout the deep white matter. No acute intracranial abnormality.
Specifically, no hemorrhage, hydrocephalus, mass lesion, acute
infarction, or significant intracranial injury.

Vascular: No hyperdense vessel or unexpected calcification.

Skull: No acute calvarial abnormality.

Sinuses/Orbits: No acute findings

Other: None
IMPRESSION: Old right parietal infarct.

Chronic small vessel disease throughout the deep white matter.

No acute intracranial abnormality.

## 2020-11-25 MED ORDER — POTASSIUM CHLORIDE CRYS ER 20 MEQ PO TBCR
40.0000 meq | EXTENDED_RELEASE_TABLET | Freq: Once | ORAL | Status: AC
  Administered 2020-11-26: 40 meq via ORAL
  Filled 2020-11-25: qty 2

## 2020-11-25 MED ORDER — IOHEXOL 350 MG/ML SOLN
70.0000 mL | Freq: Once | INTRAVENOUS | Status: AC | PRN
  Administered 2020-11-25: 70 mL via INTRAVENOUS

## 2020-11-25 NOTE — ED Notes (Signed)
Pt states she is unable to stand and will have to wait until she is in the back to give urine

## 2020-11-25 NOTE — ED Provider Notes (Signed)
Emergency Medicine Provider Triage Evaluation Note  Tracey Meyer , a 60 y.o. female  was evaluated in triage.  Pt complains of left upper quadrant abdominal pain and left flank pain.  Pain started yesterday has been constant since then.  Patient endorses constipation x3 days.  Patient denies any fevers, chills, nausea, vomiting, diarrhea, dysuria, hematuria, urinary frequency, vaginal pain, vaginal bleeding, vaginal discharge.  Review of Systems  Positive: Abdominal pain, flank pain Negative: fevers, chills, nausea, vomiting, diarrhea, dysuria, hematuria, urinary frequency, vaginal pain, vaginal bleeding, vaginal discharge  Physical Exam  BP 129/68 (BP Location: Right Arm)   Pulse 72   Temp 98.9 F (37.2 C) (Oral)   Resp 16   SpO2 97%  Gen:   Awake, no distress   Resp:  Normal effort  MSK:   Moves extremities without difficulty  Other:  Abdomen soft, nondistended, nontender, tenderness to left upper quadrant.  Left CVA tenderness.  Medical Decision Making  Medically screening exam initiated at 6:29 PM.  Appropriate orders placed.  Tracey Meyer was informed that the remainder of the evaluation will be completed by another provider, this initial triage assessment does not replace that evaluation, and the importance of remaining in the ED until their evaluation is complete.  The patient appears stable so that the remainder of the work up may be completed by another provider.      Tracey Beckwith, PA-C 11/25/20 Doristine Bosworth, MD 11/29/20 [phone]

## 2020-11-25 NOTE — ED Notes (Signed)
PT called her ride in the lobby. Per PT, their ride won't be able to pick them up until the morning

## 2020-11-25 NOTE — ED Triage Notes (Signed)
Pt here by [facility] with complaints of abdominal pain, flank pain on left side. Tender to touch.  Denies N/V. Endorses constipation X3 days. 5/10 pain  142/86 HR 78 RR 14 98% Ra CBg 195

## 2020-11-25 NOTE — ED Provider Notes (Signed)
[facility] EMERGENCY DEPARTMENT Provider Note   CSN: [id number] Arrival date & time: 11/25/20  1759     History Chief Complaint  Patient presents with   Abdominal Pain   Back Pain   Fall    Tracey Meyer is a 60 y.o. female.  Past medical history of hypertension, hyperlipidemia, diabetes type 2, STEMI, CHF, CKD stage III, CVA. Patient states she that she fell yesterday while sliding off of her wheelchair.  She states that she fell on her left side.  She is unsure if she hit her head. Denies losing consciousness  She takes Brilinta and aspirin.  She says that the pain is worse when taking a deep breath, coughing, moving.  Describes it as feeling sharp.  Pain has been constant.  She denies any shortness of breath, fever, chills, chest pain, nausea, vomiting, diarrhea, dysuria, hematuria.  Of note, she was recently discharged on September 1st for CHF exasperation.    Abdominal Pain Associated symptoms: constipation   Associated symptoms: no chest pain, no chills, no cough, no diarrhea, no dysuria, no fever, no hematuria, no nausea, no shortness of breath and no vomiting   Constipation Associated symptoms: abdominal pain   Associated symptoms: no back pain, no diarrhea, no dysuria, no fever, no nausea and no vomiting   Abdominal Pain Associated symptoms: constipation   Associated symptoms: no chest pain, no chills, no cough, no diarrhea, no dysuria, no fever, no hematuria, no nausea, no shortness of breath and no vomiting   Constipation Associated symptoms: abdominal pain   Associated symptoms: no back pain, no diarrhea, no dysuria, no fever, no nausea and no vomiting   Abdominal Pain Associated symptoms: constipation   Associated symptoms: no chest pain, no chills, no cough, no diarrhea, no dysuria, no fever, no hematuria, no nausea, no shortness of breath and no vomiting   Constipation Associated symptoms: abdominal pain   Associated symptoms: no back  pain, no diarrhea, no dysuria, no fever, no nausea and no vomiting   Abdominal Pain Associated symptoms: constipation   Associated symptoms: no chest pain, no chills, no cough, no diarrhea, no dysuria, no fever, no nausea, no shortness of breath and no vomiting   Constipation Associated symptoms: abdominal pain   Associated symptoms: no back pain, no diarrhea, no dysuria, no fever, no nausea and no vomiting   Abdominal Pain Associated symptoms: constipation   Associated symptoms: no chest pain, no chills, no cough, no diarrhea, no fever, no nausea, no shortness of breath and no vomiting   Constipation Associated symptoms: abdominal pain   Associated symptoms: no back pain, no diarrhea, no fever, no nausea and no vomiting       Past Medical History:  Diagnosis Date   Cardiomyopathy, ischemic 09/24/2020   DM (diabetes mellitus), type 2 ([place]) 09/24/2020   HTN (hypertension)    Hyperlipidemia    Prediabetes    S/P angioplasty with stent to mLAD 09/22/20  09/24/2020   Tobacco abuse 09/24/2020   Transaminitis 09/24/2020    Patient Active Problem List   Diagnosis Date Noted   Hypertensive urgency 11/21/2020   Heart failure ([place]) 11/20/2020   Acute respiratory failure with hypoxia (HCC)    AKI (acute kidney injury) ([place])    Acute lower UTI    Hypokalemia    Chronic combined systolic and diastolic congestive heart failure (HCC)    Embolic stroke ([place]) [id number]   CKD (chronic kidney disease), stage III ([place]) 09/29/2020   Left-sided weakness  Acute ischemic stroke ([place]) 09/27/2020   HFrEF (heart failure with reduced ejection fraction) ([place]) 09/27/2020   Tobacco abuse 09/24/2020   Cardiomyopathy, ischemic 09/24/2020   S/P angioplasty with stent to mLAD 09/22/20  09/24/2020   Transaminitis 09/24/2020   DM (diabetes mellitus), type 2 ([place]) 09/24/2020   Acute ST elevation myocardial infarction (STEMI) due to occlusion of left anterior descending (LAD) coronary artery ([place]) 09/22/2020    HTN (hypertension) 09/22/2020   Hyperlipidemia 09/22/2020   STEMI involving left anterior descending coronary artery ([place]) 09/22/2020   ST elevation myocardial infarction involving left anterior descending (LAD) coronary artery ([place]) 09/22/2020    Past Surgical History:  Procedure Laterality Date   BUBBLE STUDY  10/02/2020   Procedure: BUBBLE STUDY;  Surgeon: Lelon Perla, MD;  Location: [place];  Service: Cardiovascular;;   CORONARY/GRAFT ACUTE MI REVASCULARIZATION N/A 09/22/2020   Procedure: Coronary/Graft Acute MI Revascularization;  Surgeon: Martinique, Peter M, MD;  Location: [place] [facility];  Service: Cardiovascular;  Laterality: N/A;   LEFT HEART CATH AND CORONARY ANGIOGRAPHY N/A 09/22/2020   Procedure: LEFT HEART CATH AND CORONARY ANGIOGRAPHY;  Surgeon: Martinique, Peter M, MD;  Location: [place] [facility];  Service: Cardiovascular;  Laterality: N/A;   mastectomy Left    at age 60 due to concerns of cancer   TEE WITHOUT CARDIOVERSION N/A 10/02/2020   Procedure: TRANSESOPHAGEAL ECHOCARDIOGRAM (TEE);  Surgeon: Lelon Perla, MD;  Location: [facility] ENDOSCOPY;  Service: Cardiovascular;  Laterality: N/A;     OB History   No obstetric history on file.     Family History  Problem Relation Age of Onset   Cancer Mother    Lupus Sister    Heart disease Brother    Cancer Maternal Aunt     Social History   Tobacco Use   Smoking status: Former    Packs/day: 0.50    Years: 45.00    Pack years: 22.50    Types: Cigarettes    Quit date: 09/22/2020    Years since quitting: 0.1   Smokeless tobacco: Never  Vaping Use   Vaping Use: Never used  Substance Use Topics   Alcohol use: Not Currently   Drug use: Never    Home Medications Prior to Admission medications   Medication Sig Start Date End Date Taking? Authorizing Provider  acetaminophen (TYLENOL) 325 MG tablet Take 2 tablets (650 mg total) by mouth every 4 (four) hours as needed for headache or mild pain. 09/24/20  Yes  Isaiah Serge, NP  aspirin 81 MG EC tablet Take 1 tablet (81 mg total) by mouth daily. Swallow whole. 11/22/20 02/20/21 Yes [place] Eric J, DO  carvedilol (COREG) 3.125 MG tablet Take 1 tablet (3.125 mg total) by mouth 2 (two) times daily with a meal. 11/22/20 02/20/21 Yes [place] Eric J, DO  furosemide (LASIX) 40 MG tablet Take 1 tablet (40 mg total) by mouth daily. 11/23/20 02/21/21 Yes [place] Eric J, DO  lisinopril (ZESTRIL) 10 MG tablet Take 1 tablet (10 mg total) by mouth daily. 11/23/20 02/21/21 Yes [place] Eric J, DO  nitroGLYCERIN (NITROSTAT) 0.4 MG SL tablet Place 1 tablet (0.4 mg total) under the tongue every 5 (five) minutes x 3 doses as needed for chest pain. 11/05/20  Yes Angiulli, Lavon Paganini, PA-C  rosuvastatin (CRESTOR) 40 MG tablet Take 1 tablet (40 mg total) by mouth daily. 11/22/20 02/20/21 Yes [place] Donnamarie Poag, DO  ticagrelor (BRILINTA) 90 MG TABS tablet Take 1 tablet (90 mg total) by mouth 2 (  two) times daily. 11/22/20 02/20/21 Yes [place] Eric J, DO  tiZANidine (ZANAFLEX) 2 MG tablet Take 1 tablet (2 mg total) by mouth 3 (three) times daily. 11/22/20 12/22/20 Yes [place] Donnamarie Poag, DO    Allergies    Patient has no known allergies.  Review of Systems   Review of Systems  Constitutional:  Negative for chills and fever.  HENT:  Negative for congestion and rhinorrhea.   Eyes:  Negative for visual disturbance.  Respiratory:  Negative for cough, chest tightness and shortness of breath.   Cardiovascular:  Negative for chest pain, palpitations and leg swelling.       Left chest wall pain  Gastrointestinal:  Positive for abdominal pain and constipation. Negative for abdominal distention, blood in stool, diarrhea, nausea and vomiting.  Genitourinary:  Positive for flank pain. Negative for difficulty urinating, dysuria and hematuria.  Musculoskeletal:  Negative for back pain, neck pain and neck stiffness.  Skin:  Negative for rash and wound.  Neurological:  Negative for dizziness, syncope,  weakness, light-headedness and headaches.  All other systems reviewed and are negative.  Physical Exam Updated Vital Signs BP 128/68   Pulse 63   Temp 98.9 F (37.2 C) (Oral)   Resp 14   SpO2 98%   Physical Exam Vitals and nursing note reviewed.  Constitutional:      General: She is not in acute distress.    Appearance: Normal appearance. She is ill-appearing. She is not toxic-appearing or diaphoretic.  HENT:     Head: Normocephalic and atraumatic.     Mouth/Throat:     Tongue: Tongue does not deviate from midline.     Pharynx: No posterior oropharyngeal erythema.  Eyes:     General: Gaze aligned appropriately. No scleral icterus.       Right eye: No discharge.        Left eye: No discharge.     Extraocular Movements: Extraocular movements intact.     Right eye: Normal extraocular motion.     Left eye: Normal extraocular motion.     Conjunctiva/sclera: Conjunctivae normal.     Pupils: Pupils are unequal.     Comments: Left pupil > Right pupil. Reactive.   Cardiovascular:     Rate and Rhythm: Normal rate and regular rhythm.     Pulses: Normal pulses.     Heart sounds: Normal heart sounds, S1 normal and S2 normal. No murmur heard.   No friction rub. No gallop.  Pulmonary:     Effort: Pulmonary effort is normal. No respiratory distress.     Breath sounds: Normal breath sounds. No wheezing, rhonchi or rales.  Chest:     Chest wall: Tenderness present.     Comments: Tenderness to left lateral chest wall into back.  Abdominal:     General: Abdomen is flat. Bowel sounds are normal. There is no distension.     Palpations: Abdomen is soft. There is no pulsatile mass.     Tenderness: There is abdominal tenderness in the left upper quadrant. There is left CVA tenderness. There is no guarding or rebound.  Musculoskeletal:     Cervical back: Normal range of motion.     Right lower leg: No edema.     Left lower leg: No edema.  Skin:    General: Skin is warm and dry.      Coloration: Skin is not jaundiced.     Findings: No bruising, erythema, lesion or rash.  Neurological:     General: No focal  deficit present.     Mental Status: She is alert and oriented to person, place, and time.     GCS: GCS eye subscore is 4. GCS verbal subscore is 5. GCS motor subscore is 6.     Cranial Nerves: Cranial nerves are intact.     Sensory: Sensation is intact.     Motor: Weakness present.     Comments: LUE and LLE weakness. This is baseline.   Psychiatric:        Mood and Affect: Mood normal.        Behavior: Behavior normal.    ED Results / Procedures / Treatments   Labs (all labs ordered are listed, but only abnormal results are displayed) Labs Reviewed  CBC WITH DIFFERENTIAL/PLATELET - Abnormal; Notable for the following components:      Result Value   RBC 3.50 (*)    Hemoglobin 10.3 (*)    HCT 31.8 (*)    Platelets 427 (*)    All other components within normal limits  COMPREHENSIVE METABOLIC PANEL - Abnormal; Notable for the following components:   Potassium 3.2 (*)    Glucose, Bld 179 (*)    BUN 37 (*)    Creatinine, Ser 1.66 (*)    Albumin 3.4 (*)    ALT 52 (*)    GFR, Estimated 35 (*)    All other components within normal limits  LIPASE, BLOOD    EKG None  Radiology DG Chest 2 View  Result Date: 11/25/2020 CLINICAL DATA:  Fall, left rib pain EXAM: CHEST - 2 VIEW COMPARISON:  11/21/2020 FINDINGS: Heart is borderline in size. Lungs clear. No effusions or edema. No acute bony abnormality. IMPRESSION: No active cardiopulmonary disease. Electronically Signed   By: Rolm Baptise M.D.   On: 11/25/2020 22:46   CT Head Wo Contrast  Result Date: 11/25/2020 CLINICAL DATA:  Fall, on blood thinners EXAM: CT HEAD WITHOUT CONTRAST TECHNIQUE: Contiguous axial images were obtained from the base of the skull through the vertex without intravenous contrast. COMPARISON:  10/01/2020 FINDINGS: Brain: Old right occipital infarct. Chronic small vessel disease throughout  the deep white matter. No acute intracranial abnormality. Specifically, no hemorrhage, hydrocephalus, mass lesion, acute infarction, or significant intracranial injury. Vascular: No hyperdense vessel or unexpected calcification. Skull: No acute calvarial abnormality. Sinuses/Orbits: No acute findings Other: None IMPRESSION: Old right parietal infarct. Chronic small vessel disease throughout the deep white matter. No acute intracranial abnormality. Electronically Signed   By: Rolm Baptise M.D.   On: 11/25/2020 23:42   CT Cervical Spine Wo Contrast  Result Date: 11/25/2020 CLINICAL DATA:  Fall, neck trauma EXAM: CT CERVICAL SPINE WITHOUT CONTRAST TECHNIQUE: Multidetector CT imaging of the cervical spine was performed without intravenous contrast. Multiplanar CT image reconstructions were also generated. COMPARISON:  None. FINDINGS: Alignment: Normal Skull base and vertebrae: No acute fracture. No primary bone lesion or focal pathologic process. Soft tissues and spinal canal: No prevertebral fluid or swelling. No visible canal hematoma. Disc levels: Mild diffuse degenerative disc disease with anterior spurring. Degenerative facet disease bilaterally, left greater than right. Upper chest: Negative Other: None IMPRESSION: No acute bony abnormality.  Degenerative change. Electronically Signed   By: Rolm Baptise M.D.   On: 11/25/2020 23:44   CT ABDOMEN PELVIS W CONTRAST  Result Date: 11/25/2020 CLINICAL DATA:  Fall, left upper quadrant pain. EXAM: CT ABDOMEN AND PELVIS WITH CONTRAST TECHNIQUE: Multidetector CT imaging of the abdomen and pelvis was performed using the standard protocol following bolus administration of  intravenous contrast. CONTRAST:  36m OMNIPAQUE IOHEXOL 350 MG/ML SOLN COMPARISON:  January 01, 2005 FINDINGS: Lower chest: No acute abnormality. Hepatobiliary: No focal liver abnormality is seen. No gallstones, gallbladder wall thickening, or biliary dilatation. Pancreas: No pancreatic ductal  dilatation or surrounding inflammatory changes. Spleen: Normal in size without focal abnormality. Adrenals/Urinary Tract: Adrenal glands are unremarkable. Kidneys are normal, without renal calculi, solid enhancing lesion, or hydronephrosis. Bladder is unremarkable. Stomach/Bowel: Small hiatal hernia otherwise the stomach is unremarkable for degree of distension. No pathologic dilation of small or large bowel. Normal appendix and terminal ileum colonic diverticulosis without findings of acute diverticulitis. No evidence of acute bowel inflammation. Vascular/Lymphatic: Aortic atherosclerosis without aneurysmal dilation. No pathologically enlarged abdominal or pelvic lymph nodes. Reproductive: Uterus and bilateral adnexa are unremarkable. Other: No abdominopelvic ascites. Musculoskeletal: Multilevel degenerative changes spine. Degenerative changes bilateral hips. No acute osseous abnormality. IMPRESSION: 1. No acute abdominopelvic findings. 2. Colonic diverticulosis without findings of acute diverticulitis. 3. Small hiatal hernia. 4.  Aortic Atherosclerosis (ICD10-I70.0). Electronically Signed   By: JDahlia BailiffM.D.   On: 11/25/2020 23:46    Procedures Procedures   Medications Ordered in ED Medications  potassium chloride SA (KLOR-CON) CR tablet 40 mEq (has no administration in time range)  iohexol (OMNIPAQUE) 350 MG/ML injection 70 mL (70 mLs Intravenous Contrast Given 11/25/20 2338)    ED Course  I have reviewed the triage vital signs and the nursing notes.  Pertinent labs & imaging results that were available during my care of the patient were reviewed by me and considered in my medical decision making (see chart for details).    MDM Rules/Calculators/A&P                         Patient was initially seen in [facility] and initial labs and imaging were ordered at that time.  Vitals are stable.  Oxygenating well on room air.  On my assessment patient is alert and oriented x3.  She is complaining of  pain to her left rib cage area and left abdomen.  She says that she is sustained a mechanical fall yesterday from her wheelchair and is unclear whether she hit her head or not.  She is on Brilinta and aspirin daily.  Exam notable for enlarged left pupil greater than right.  Both are reactive.  She has left-sided weakness but this is per baseline per patient.  Unsure if anisocoria is baseline.  Otherwise all cranial nerves are intact.  Ordered CT head and CT cervical spine to rule out traumatic bleed due to uncertainty of patient hitting head and being on Brilinta and aspirin.  Also ordered chest x-ray bc I am concerned for a rib fracture.  CT abdomen pelvis was placed to evaluate for vascular or traumatic injury.     Labs are notable of creatinine of 1.66 which is 0.3 above baseline, but patient has CKD.  Mild hypokalemia.  Hemoglobin is stable from prior lab.  Chest x-ray with no evidence of fracture, pneumothorax, pleural effusion, signs of heart failure exasperation.  2316: I spoke with radiologist, Dr. SCarmina Millerregarding patient's CT scan.  She recommends getting a CT abdomen pelvis with contrast at a reduced dose to evaluate for vascular injury or traumatic injury despite patient's GFR of 35.   Replaced potassium in the ED.  Head CT negative for acute abnormality.  CT cervical spine negative for acute abnormality.  CT abdomen pelvis negative for any internal injury.  Chest x-ray negative  for fracture, pneumothorax, pleural effusion, signs of heart failure exasperation.  On reevaluation, patient's vital signs are stable.  Patient is in no acute distress.  No acute finding was found on imaging.  From an emergency standpoint patient is stable for discharge.   Final Clinical Impression(s) / ED Diagnoses Final diagnoses:  Fall, initial encounter    Rx / DC Orders ED Discharge Orders     None        Adolphus Birchwood, PA-C 11/26/20 0009    Lucrezia Starch, MD 11/27/20 2250

## 2020-11-26 NOTE — Discharge Instructions (Addendum)
You have been seen in the ED after a fall. Your imaging was negative for fracture or other internal injury. Please return if you experience any worsening symptoms.

## 2020-11-26 NOTE — ED Notes (Signed)
Called [facility] to transport patient home.

## 2020-11-27 ENCOUNTER — Telehealth (HOSPITAL_COMMUNITY): Payer: Self-pay

## 2020-11-27 ENCOUNTER — Encounter (HOSPITAL_COMMUNITY): Payer: Self-pay

## 2020-11-27 NOTE — Telephone Encounter (Signed)
Heart Failure Nurse Navigator Progress Note  Attempted to call regarding address and location of persons to be picked up for rescheduled HV TOC appt Friday 9/9 @ 12pm. Pt stated transportation did not pick her up today for HV TOC.  [facility] [employer] stated pt did not answer phone to confirm ride, therefore, no ride was sent to her residence.   Navigator cannot reach patient at listed number either. Will attempt to reach patient again to set up transportation and confirm ride for Friday's appt.  Pricilla Holm, MSN, RN Heart Failure Nurse Navigator [phone]

## 2020-11-28 ENCOUNTER — Ambulatory Visit (HOSPITAL_BASED_OUTPATIENT_CLINIC_OR_DEPARTMENT_OTHER): Payer: Self-pay | Admitting: Family

## 2020-11-30 ENCOUNTER — Encounter (HOSPITAL_COMMUNITY): Payer: Self-pay

## 2020-11-30 ENCOUNTER — Other Ambulatory Visit: Payer: Self-pay

## 2020-11-30 ENCOUNTER — Ambulatory Visit (HOSPITAL_COMMUNITY)
Admission: RE | Admit: 2020-11-30 | Discharge: 2020-11-30 | Disposition: A | Discharge status: Home / Self Care | Payer: Medicaid Other | Source: Ambulatory Visit | Attending: Internal Medicine | Admitting: Internal Medicine

## 2020-11-30 ENCOUNTER — Telehealth (HOSPITAL_COMMUNITY): Payer: Self-pay

## 2020-11-30 VITALS — BP 150/90 | HR 69 | Wt 138.2 lb

## 2020-11-30 DIAGNOSIS — Z596 Low income: Secondary | ICD-10-CM | POA: Insufficient documentation

## 2020-11-30 DIAGNOSIS — I5042 Chronic combined systolic (congestive) and diastolic (congestive) heart failure: Secondary | ICD-10-CM

## 2020-11-30 DIAGNOSIS — Z87891 Personal history of nicotine dependence: Secondary | ICD-10-CM | POA: Diagnosis not present

## 2020-11-30 DIAGNOSIS — E785 Hyperlipidemia, unspecified: Secondary | ICD-10-CM | POA: Insufficient documentation

## 2020-11-30 DIAGNOSIS — I255 Ischemic cardiomyopathy: Secondary | ICD-10-CM | POA: Diagnosis not present

## 2020-11-30 DIAGNOSIS — N1831 Chronic kidney disease, stage 3a: Secondary | ICD-10-CM | POA: Insufficient documentation

## 2020-11-30 DIAGNOSIS — Z79899 Other long term (current) drug therapy: Secondary | ICD-10-CM | POA: Insufficient documentation

## 2020-11-30 DIAGNOSIS — I2510 Atherosclerotic heart disease of native coronary artery without angina pectoris: Secondary | ICD-10-CM | POA: Insufficient documentation

## 2020-11-30 DIAGNOSIS — E1122 Type 2 diabetes mellitus with diabetic chronic kidney disease: Secondary | ICD-10-CM | POA: Diagnosis not present

## 2020-11-30 DIAGNOSIS — I5022 Chronic systolic (congestive) heart failure: Secondary | ICD-10-CM | POA: Diagnosis not present

## 2020-11-30 DIAGNOSIS — Z733 Stress, not elsewhere classified: Secondary | ICD-10-CM | POA: Insufficient documentation

## 2020-11-30 DIAGNOSIS — Z955 Presence of coronary angioplasty implant and graft: Secondary | ICD-10-CM | POA: Insufficient documentation

## 2020-11-30 DIAGNOSIS — Z8249 Family history of ischemic heart disease and other diseases of the circulatory system: Secondary | ICD-10-CM | POA: Diagnosis not present

## 2020-11-30 DIAGNOSIS — Z5941 Food insecurity: Secondary | ICD-10-CM | POA: Insufficient documentation

## 2020-11-30 DIAGNOSIS — I130 Hypertensive heart and chronic kidney disease with heart failure and stage 1 through stage 4 chronic kidney disease, or unspecified chronic kidney disease: Secondary | ICD-10-CM | POA: Diagnosis present

## 2020-11-30 DIAGNOSIS — Z7982 Long term (current) use of aspirin: Secondary | ICD-10-CM | POA: Insufficient documentation

## 2020-11-30 DIAGNOSIS — I69354 Hemiplegia and hemiparesis following cerebral infarction affecting left non-dominant side: Secondary | ICD-10-CM | POA: Insufficient documentation

## 2020-11-30 DIAGNOSIS — I252 Old myocardial infarction: Secondary | ICD-10-CM | POA: Diagnosis not present

## 2020-11-30 DIAGNOSIS — Z8673 Personal history of transient ischemic attack (TIA), and cerebral infarction without residual deficits: Secondary | ICD-10-CM

## 2020-11-30 DIAGNOSIS — Z597 Insufficient social insurance and welfare support: Secondary | ICD-10-CM | POA: Diagnosis not present

## 2020-11-30 LAB — BASIC METABOLIC PANEL
Anion gap: 6 (ref 5–15)
BUN: 18 mg/dL (ref 6–20)
CO2: 24 mmol/L (ref 22–32)
Calcium: 9.6 mg/dL (ref 8.9–10.3)
Chloride: 108 mmol/L (ref 98–111)
Creatinine, Ser: 1.24 mg/dL — ABNORMAL HIGH (ref 0.44–1.00)
GFR, Estimated: 50 mL/min — ABNORMAL LOW (ref >60)
Glucose, Bld: 122 mg/dL — ABNORMAL HIGH (ref 70–99)
Potassium: 3.4 mmol/L — ABNORMAL LOW (ref 3.5–5.1)
Sodium: 138 mmol/L (ref 135–145)

## 2020-11-30 MED ORDER — SPIRONOLACTONE 25 MG PO TABS: 12.5000 mg | ORAL_TABLET | Freq: Every day | ORAL | 0 refills | Status: DC

## 2020-11-30 MED ORDER — FUROSEMIDE 40 MG PO TABS: 20.0000 mg | ORAL_TABLET | Freq: Every day | ORAL | 0 refills | Status: DC

## 2020-11-30 NOTE — Progress Notes (Signed)
[facility]  PCP: Pending  Primary Cardiologist: Peter Martinique, MD   HPI:  Tracey Meyer is a 60 y.o.  female  with a PMH significant for CVA, CAD, HTN, HLD   Anterior STEMI 09/2020 s/p PCI to LAD.  Ischemic CM EF 30-35%  CVA w/left sided weakness 3 days after STEMI discharge date.  Felt to be possibly cardioembolic. TEE without LV thrombus, ASD or LAA thrombus. Continued on DAPT. GDMT titration difficult given significantly stenotic posterior circulation with recommendation for  long-term BP goal 130-150.    CIR admission  CHF admission after CIR 11/20/20.  Presented to [facility] ED with severe HTN BP>200, chest x-ray w/bilateral pulmonary edema, HS trop 92>96, BNP 1581 up from 724 (09/2020 admission).  Started on IV lasix wt 143 > 138lbs.    Feeling much better since discharge.  Denies dypsnea, chest pain, orthopnea or PND.  Sleeping a lot better.  Mobility still very limited from CVA.  Was able to cook some eggs yesterday and get a few things done around the house.     ROS: All systems negative except as listed in HPI, PMH and Problem List.  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    Comment: pending disability as of 11/21/20  Tobacco Use   Smoking status: Former    Packs/day: 0.50    Years: 45.00    Pack years: 22.50    Types: Cigarettes    Quit date: 09/22/2020    Years since quitting: 0.1   Smokeless tobacco: Never  Vaping Use   Vaping Use: Never used  Substance and Sexual Activity   Alcohol use: Not Currently   Drug use: Never   Sexual activity: Not on file  Other Topics Concern   Not on file  Social History Narrative   Not on file   Social Determinants of Health   Financial Resource Strain: High Risk    Difficulty of Paying Living Expenses: Very hard  Food Insecurity: Food Insecurity Present   Worried About Charity fundraiser in the Last Year: Sometimes true   Arboriculturist in the Last Year: Sometimes true  Transportation Needs: Unmet Transportation Needs   Lack of Transportation (Medical): Yes   Lack of Transportation (Non-Medical): Yes  Physical Activity: Not on file  Stress: Not on file  Social Connections: Not on file  Intimate Partner Violence: Not on file    FH:  Family History  Problem Relation Age of Onset   Cancer Mother    Lupus Sister    Heart disease Brother    Cancer Maternal Aunt     Past Medical History:  Diagnosis Date   Cardiomyopathy, ischemic 09/24/2020   DM (diabetes mellitus), type 2 ([place]) 09/24/2020   HTN (hypertension)    Hyperlipidemia    Prediabetes    S/P angioplasty with stent to mLAD 09/22/20  09/24/2020   Tobacco abuse 09/24/2020   Transaminitis 09/24/2020    Current Outpatient Medications  Medication Sig Dispense Refill   acetaminophen (TYLENOL) 325 MG tablet Take 2 tablets (650 mg total) by mouth every 4 (four) hours as needed for headache or mild pain.     aspirin 81 MG EC tablet  Take 1 tablet (81 mg total) by mouth daily. Swallow whole. 30 tablet 2   carvedilol (COREG) 3.125 MG tablet Take 1 tablet (3.125 mg total) by mouth 2 (two) times daily with a meal. 60 tablet 2   famotidine (PEPCID) 20 MG tablet Take 20 mg by mouth daily.     lisinopril (ZESTRIL) 10 MG tablet Take 1 tablet (10 mg total) by mouth daily. 30 tablet 2   nitroGLYCERIN (NITROSTAT) 0.4 MG SL tablet Place 1 tablet (0.4 mg total) under the tongue every 5 (five) minutes x 3 doses as needed for chest pain. 25 tablet 0   rosuvastatin (CRESTOR) 40 MG tablet Take 1 tablet (40 mg total) by mouth daily. 30 tablet 2   ticagrelor (BRILINTA) 90 MG TABS tablet Take 1 tablet (90 mg total) by mouth 2 (two) times daily. 60 tablet 2   tiZANidine (ZANAFLEX) 2 MG tablet Take 1 tablet (2  mg total) by mouth 3 (three) times daily. 90 tablet 0   furosemide (LASIX) 40 MG tablet Take 0.5 tablets (20 mg total) by mouth daily. 30 tablet 0   spironolactone (ALDACTONE) 25 MG tablet Take 0.5 tablets (12.5 mg total) by mouth daily. 30 tablet 0   No current facility-administered medications for this encounter.    Vitals:   11/30/20 1352  BP: (!) 150/90  Pulse: 69  SpO2: 97%  Weight: 62.7 kg (138 lb 3.2 oz)    PHYSICAL EXAM: Cardiac: JVD flat, normal rate and rhythm, clear s1 and s2, no murmurs, rubs or gallops, no LE edema Pulmonary: CTAB, not in distress Abdominal: non distended abdomen, soft and nontender Psych: Alert, conversant, in good spirits   ASSESSMENT & PLAN: Ischemic CM  Chronic Systolic CHF -Anterior STEMI 09/2020 s/p PCI to LAD.  Ischemic CM EF 30-35% -Continue carvedilol 3.125 BID, Lisinopril '10mg'$  daily -NYHA Class II, euvolemic on exam -Start spiro 12.5 daily -Decrease lasix to '20mg'$  daily -With tight bp goal by neurology don't think she would meet that on Entresto.     Recent CVA -possibly cardioembolic in the setting of recent MI, does have significant atheroscerlotic disease -During stay neurology and cardiology recommended to continue DAPT therapy currently, she does have close follow up with primary cardiology and neurology teams -Given significantly stenotic posterior circulation with recommendation for  long-term BP goal 130-150.    [id number] -repeat bmp today -Gdmt changes above   Social work referral today for financial/medication assistance, obtaining insurance Pharmacy referral for medication assistance   Follow up w/[facility] cardiology

## 2020-11-30 NOTE — Telephone Encounter (Signed)
Called to confirm Heart & Vascular Transitions of Care appointment at 12pm today. Patient reminded to bring all medications and pill box organizer with them.  Pt is ready and asked about transportation.   Awaiting confirmation from [facility] Transport services regarding ride to appt today. Pt had been rescheduled from Tuesday as she did not answer phone when transportation called to confirm ride; therefore, per [facility] policy ride was canceled. Pt missed appt on 9/6, Navigator unable to reach pt as no answer and vm box full. Navigator did not reschedule this appt with HV TOC--no transportation was arranged prior regarding appt 9/9 @ noon.   [facility] called, working on arranging quick transportation for appt today. Navigator attempted to reach out to pt to explain plan--no answer, vm box full.   Awaiting return call from transportation service to confirm ride today.  Pricilla Holm, MSN, RN Heart Failure Nurse Navigator [phone]

## 2020-11-30 NOTE — Progress Notes (Signed)
Heart and Vascular Care Navigation  11/30/2020  Tracey Meyer [birth year] [id number]  Reason for Referral: Patient seen in HF TOC.    Engaged with patient face to face for initial visit for Heart and Vascular Care Coordination.                                                                                                   Assessment: Patient is a 60 yo female who has been with her SO for the past 28 years. She reports they live together in a mobile home in [place], [place]. She worked as a Loss adjuster, chartered for 16 years and last worked the end of June as she was hospitalized with a stroke and an MI. Patient has left sided weakness and ambulates with a walker/w/c.   She has a pending medicaid and SS Disability application. Her SO is on SSI and at the moment able to handle the household expenses with food stamps.                                   HRT/VAS Care Coordination     Patients Home Cardiology Office --  [facility]   Outpatient Care Team Social Worker   Social Worker Name: [name], [place] [phone]   Living arrangements for the past 2 months Mobile Home   Lives with: Self; Significant Other   Patient Current Insurance Coverage Medicaid Pending   Patient Has Concern With Paying Medical Bills Yes   Medical Bill Referrals: pending medicaid   Does Patient Have Prescription Coverage? No   Home Assistive Devices/Equipment Cane (specify quad or straight); Walker (specify type); Wheelchair; Shower chair with back; Bedside commode/3-in-1   DME Agency NA   Current home services DME  raised toilet seat, shower chair       Social History:                                                                             SDOH Screenings   Alcohol Screen: Low Risk    Last Alcohol Screening Score (AUDIT): 0  Depression (PHQ2-9): Medium Risk   PHQ-2 Score: 5  Financial Resource Strain: High Risk   Difficulty of Paying Living Expenses: Very hard  Food Insecurity: Food Insecurity Present   Worried About  Charity fundraiser in the Last Year: Sometimes true   Ran Out of Food in the Last Year: Sometimes true  Housing: Low Risk    Last Housing Risk Score: 0  Physical Activity: Not on file  Social Connections: Not on file  Stress: Not on file  Tobacco Use: Medium Risk   Smoking Tobacco Use: Former   Smokeless Tobacco Use: Never  Transportation Needs: Unmet Transportation Needs   Lack of  Transportation (Medical): Yes   Lack of Transportation (Non-Medical): Yes    SDOH Interventions: Financial Resources:  Sales promotion account executive Interventions: Development worker, community, Other (Comment) Pending Caid and Disability and provided application for [employer] Insecurity:  Food Insecurity Interventions: Assist with [employer] Application  Housing Insecurity:   N/a  Transportation:   Transportation Interventions: Financial planner    Follow-up plan:  Patient will obtain needed documents to support Med[employer]ist application and bring to follow up appointment. Patient will make CSW aware if she receives letter from [facility] or Medicaid with update. CSW will follow up with [name], LCSW to make aware of follow up appointment. [name], [name], [name]

## 2020-11-30 NOTE — Patient Instructions (Addendum)
  Start Spironolactone 12.5 mg ( 1/2 tablet) daily  Decrease Lasix  to 20 mg daily  Lab work done today we will call you with any abnormal results  Your physician recommends that you schedule a follow-up appointment : [place] as scheduled.

## 2020-12-03 ENCOUNTER — Telehealth (HOSPITAL_COMMUNITY): Payer: Self-pay | Admitting: Pharmacist

## 2020-12-03 NOTE — Telephone Encounter (Signed)
Provider portion filled out. Entire application faxed to [employer].   Loel Dubonnet, NP

## 2020-12-03 NOTE — Telephone Encounter (Signed)
Heart Failure Stewardship Pharmacist  Transitions of Care Follow-up Call  PCP: Pcp, No PCP-Cardiologist: Peter Martinique, MD    Patient seen in [facility] [place] [facility] 11/30/20. Patient portion of [employer] application completed. Faxed application to Dr. Doug Sou office at [facility] so provider portion can be completed and sent to [employer]. Patient will follow up at that office.   Audry Riles, PharmD, BCPS, BCCP, CPP Heart Failure Clinic Pharmacist [phone]

## 2020-12-06 ENCOUNTER — Other Ambulatory Visit (HOSPITAL_BASED_OUTPATIENT_CLINIC_OR_DEPARTMENT_OTHER): Payer: Self-pay

## 2020-12-07 ENCOUNTER — Telehealth: Payer: Self-pay | Admitting: Licensed Clinical Social Worker

## 2020-12-07 ENCOUNTER — Other Ambulatory Visit (HOSPITAL_BASED_OUTPATIENT_CLINIC_OR_DEPARTMENT_OTHER): Payer: Self-pay

## 2020-12-07 NOTE — Telephone Encounter (Signed)
LCSW called pt this afternoon to check in and ensure she was going to be able to make it to her appt at [facility] Monday. Pt confirms she has a ride. Is unsure if she has her [employer] application. I encouraged her to look for it, if she has it then then we can review it with her, if not we can complete a new one.  I have also reached out to [employer] to check on status of Medicaid.   [name], MSW, [place]  [phone]

## 2020-12-10 ENCOUNTER — Ambulatory Visit (INDEPENDENT_AMBULATORY_CARE_PROVIDER_SITE_OTHER): Payer: Self-pay | Admitting: Family

## 2020-12-10 ENCOUNTER — Ambulatory Visit (INDEPENDENT_AMBULATORY_CARE_PROVIDER_SITE_OTHER): Payer: Self-pay

## 2020-12-10 ENCOUNTER — Encounter (HOSPITAL_BASED_OUTPATIENT_CLINIC_OR_DEPARTMENT_OTHER): Payer: Self-pay | Admitting: Family

## 2020-12-10 ENCOUNTER — Other Ambulatory Visit: Payer: Self-pay

## 2020-12-10 VITALS — BP 168/80 | HR 63 | Ht 63.0 in | Wt 138.0 lb

## 2020-12-10 DIAGNOSIS — Z8673 Personal history of transient ischemic attack (TIA), and cerebral infarction without residual deficits: Secondary | ICD-10-CM

## 2020-12-10 DIAGNOSIS — I5022 Chronic systolic (congestive) heart failure: Secondary | ICD-10-CM

## 2020-12-10 DIAGNOSIS — N1831 Chronic kidney disease, stage 3a: Secondary | ICD-10-CM

## 2020-12-10 DIAGNOSIS — I255 Ischemic cardiomyopathy: Secondary | ICD-10-CM

## 2020-12-10 MED ORDER — FUROSEMIDE 40 MG PO TABS: 40.0000 mg | ORAL_TABLET | Freq: Every day | ORAL | 5 refills | Status: DC

## 2020-12-10 MED ORDER — TICAGRELOR 90 MG PO TABS: 90.0000 mg | ORAL_TABLET | Freq: Two times a day (BID) | ORAL | 5 refills | Status: DC

## 2020-12-10 MED ORDER — SPIRONOLACTONE 25 MG PO TABS: 12.5000 mg | ORAL_TABLET | Freq: Every day | ORAL | 5 refills | Status: DC

## 2020-12-10 MED ORDER — CARVEDILOL 3.125 MG PO TABS: 3.1250 mg | ORAL_TABLET | Freq: Two times a day (BID) | ORAL | 5 refills | Status: DC

## 2020-12-10 MED ORDER — ROSUVASTATIN CALCIUM 40 MG PO TABS: 40.0000 mg | ORAL_TABLET | Freq: Every day | ORAL | 5 refills | Status: DC

## 2020-12-10 MED ORDER — LISINOPRIL 20 MG PO TABS: 20.0000 mg | ORAL_TABLET | Freq: Every day | ORAL | 5 refills | Status: DC

## 2020-12-10 NOTE — Patient Instructions (Addendum)
Medication Instructions:  Your physician has recommended you make the following change in your medication:    CHANGE your Lisinopril to '20mg'$  daily  *If you need a refill on your cardiac medications before your next appointment, please call your pharmacy*  Lab Work: Your physician recommends that you return for lab work in 1 week at [facility] at [address] in [place], [place]. No need for appointment, stop by 8:30am - 4:30pm but avoid 12p-1p.  Testing/Procedures:   Your physician has recommended that you wear a Zio monitor.   This monitor is a medical device that records the heart's electrical activity. Doctors most often use these monitors to diagnose arrhythmias. Arrhythmias are problems with the speed or rhythm of the heartbeat. The monitor is a small device applied to your chest. You can wear one while you do your normal daily activities. While wearing this monitor if you have any symptoms to push the button and record what you felt. Once you have worn this monitor for the period of time provider prescribed (Usually 14 days), you will return the monitor device in the postage paid box. Once it is returned they will download the data collected and provide [place] with a report which the provider will then review and we will call you with those results. Important tips:  Avoid showering during the first 24 hours of wearing the monitor. Avoid excessive sweating to help maximize wear time. Do not submerge the device, no hot tubs, and no swimming pools. Keep any lotions or oils away from the patch. After 24 hours you may shower with the patch on. Take brief showers with your back facing the shower head.  Do not remove patch once it has been placed because that will interrupt data and decrease adhesive wear time. Push the button when you have any symptoms and write down what you were feeling. Once you have completed wearing your monitor, remove and place into box which has postage paid  and place in your outgoing mailbox.  If for some reason you have misplaced your box then call our office and we can provide another box and/or mail it off for you.  Follow-Up: At [facility], you and your health needs are our priority.  As part of our continuing mission to provide you with exceptional heart care, we have created designated Provider Care Teams.  These Care Teams include your primary Cardiologist (physician) and Advanced Practice Providers (APPs -  Physician Assistants and Nurse Practitioners) who all work together to provide you with the care you need, when you need it.  We recommend signing up for the patient portal called "MyChart".  Sign up information is provided on this After Visit Summary.  MyChart is used to connect with patients for Virtual Visits (Telemedicine).  Patients are able to view lab/test results, encounter notes, upcoming appointments, etc.  Non-urgent messages can be sent to your provider as well.   To learn more about what you can do with MyChart, go to [email or web address].    Your next appointment:   6 week(s)  The format for your next appointment:   In Person  Provider:   You may see Peter Martinique, MD or one of the following Advanced Practice Providers on your designated Care Team:   Almyra Deforest, PA-C Fabian Sharp, [place] or  Roby Lofts, [place]  Other Instructions  Tips to Measure your Blood Pressure Correctly  Here's what you can do to ensure a correct reading:  Don't drink a caffeinated  beverage or smoke during the 30 minutes before the test.  Sit quietly for five minutes before the test begins.  During the measurement, sit in a chair with your feet on the floor and your arm supported so your elbow is at about heart level.  The inflatable part of the cuff should completely cover at least 80% of your upper arm, and the cuff should be placed on bare skin, not over a shirt.  Don't talk during the measurement.  Have your blood pressure measured  twice, with a brief break in between. If the readings are different by 5 points or more, have it done a third time.  Blood Pressure Log   Date   Time  Blood Pressure  Position  Example: Nov 1 9 AM 124/78 sitting                                                   Heart Healthy Diet Recommendations: A low-salt diet is recommended. Meats should be grilled, baked, or boiled. Avoid fried foods. Focus on lean protein sources like fish or chicken with vegetables and fruits. The American Heart Association is a Microbiologist!   Exercise recommendations: The American Heart Association recommends 150 minutes of moderate intensity exercise weekly. Try 30 minutes of moderate intensity exercise 4-5 times per week. This could include walking, jogging, or swimming.

## 2020-12-10 NOTE — Progress Notes (Signed)
Office Visit    Patient Name: Tracey Meyer Date of Encounter: 12/10/2020  PCP:  Pcp, No   [place]  Cardiologist:  Peter Martinique, MD  Advanced Practice Provider:  No care team member to display Electrophysiologist:  None   Chief Complaint    Tracey Meyer is a 60 y.o. female with a hx of CAD s/p DES to LAD, CVA, HTN, HLD presents today for hospital follow up   Past Medical History    Past Medical History:  Diagnosis Date   Cardiomyopathy, ischemic 09/24/2020   DM (diabetes mellitus), type 2 ([place]) 09/24/2020   HTN (hypertension)    Hyperlipidemia    Prediabetes    S/P angioplasty with stent to mLAD 09/22/20  09/24/2020   Tobacco abuse 09/24/2020   Transaminitis 09/24/2020   Past Surgical History:  Procedure Laterality Date   BUBBLE STUDY  10/02/2020   Procedure: BUBBLE STUDY;  Surgeon: Lelon Perla, MD;  Location: [facility] ENDOSCOPY;  Service: Cardiovascular;;   CORONARY/GRAFT ACUTE MI REVASCULARIZATION N/A 09/22/2020   Procedure: Coronary/Graft Acute MI Revascularization;  Surgeon: Martinique, Peter M, MD;  Location: [place] CV LAB;  Service: Cardiovascular;  Laterality: N/A;   LEFT HEART CATH AND CORONARY ANGIOGRAPHY N/A 09/22/2020   Procedure: LEFT HEART CATH AND CORONARY ANGIOGRAPHY;  Surgeon: Martinique, Peter M, MD;  Location: [place] CV LAB;  Service: Cardiovascular;  Laterality: N/A;   mastectomy Left    at age 36 due to concerns of cancer   TEE WITHOUT CARDIOVERSION N/A 10/02/2020   Procedure: TRANSESOPHAGEAL ECHOCARDIOGRAM (TEE);  Surgeon: Lelon Perla, MD;  Location: [facility] ENDOSCOPY;  Service: Cardiovascular;  Laterality: N/A;    Allergies  No Known Allergies  History of Present Illness    Tracey Meyer is a 60 y.o. female with a hx of CAD s/p DES to LAD, CVA, HTN, HLD last seen while hospitalized.  She had anterior STEMI 09/2020 s/p PCI to LAD with newly diagnosed ischemic cardiomyopathy LVEF 30-35%. She was  readmitted 3 days after discharge from STEMI with CVA with left sided weakness. TEE without LV thrombus, ASD, or LAA thrombus. GDMT titration was difficult given significantly stenotic posterior circulation with recommendation for BP goal 130-150. She was discharged to [facility] 11/20/20.   Re admitted 11/20/20 with CHF exacerbation. BP >200, CXR bilateral pulmonary edema, BNP 1581. Treated with IV Lasix.   Seen in TOC follow up and started on Spironolactone with reduced dose of Lasix. Her renal function and electrolytes remained stable.   Presents today for follow up with her significant other. Social work team has been following closely for resources. She is walking at home down her ramps and moving up out of the bed. Working to gradually increase activity as not currently doing PT due to lack of insurance. Not checking BP at home as cuff is out of batteries and we were able to provide batteries in clinic today. Reports no shortness of breath and stable dyspnea on exertion. Reports no chest pain, pressure, or tightness. No edema, orthopnea, PND. Reports no palpitations.    EKGs/Labs/Other Studies Reviewed:   The following studies were reviewed today:  Echo 09/22/20:  1. Apex well-visualized without contrast - false tendon (normal variant)  - no thrombus. Left ventricular ejection fraction, by estimation, is 35 to  40%. The left ventricle has moderately decreased function. The left  ventricle demonstrates regional wall  motion abnormalities (see scoring diagram/findings for description). There  is mild left ventricular hypertrophy.  Left ventricular diastolic  parameters are consistent with Grade I diastolic dysfunction (impaired  relaxation). Elevated left ventricular  end-diastolic pressure. There is severe hypokinesis of the left  ventricular, entire anterior wall, anteroseptal wall, apical segment and  inferoapical segment. Findings suggest LAD territory ischemia/infarct.   2. Right ventricular  systolic function is normal. The right ventricular  size is normal. There is normal pulmonary artery systolic pressure.   3. The mitral valve is abnormal. Mild mitral valve regurgitation.   4. The aortic valve is tricuspid. Aortic valve regurgitation is not  visualized.   5. The inferior vena cava is normal in size with <50% respiratory  variability, suggesting right atrial pressure of 8 mmHg.   LHC 09/22/20: RPDA lesion is 50% stenosed. Dist Cx lesion is 30% stenosed with 30% stenosed side branch in LPAV. 1st Diag lesion is 60% stenosed. Mid LAD lesion is 100% stenosed. Post intervention, there is a 0% residual stenosis. A drug-eluting stent was successfully placed using a STENT ONYX FRONTIER [id number]. LV end diastolic pressure is mildly elevated.   1. 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 Mildly elevated LVEDP 21 mmHg 3. Successful PCI of the mid LAD with DES x 1   Diagnostic Dominance: Co-dominant     Intervention         Echocardiogram 09/28/20: 1. Left ventricular ejection fraction, by estimation, is 30 to 35%. The  left ventricle has moderately decreased function. The left ventricle  demonstrates regional wall motion abnormalities (see scoring  diagram/findings for description). There is mild  left ventricular hypertrophy. Left ventricular diastolic parameters are  consistent with Grade II diastolic dysfunction (pseudonormalization).  Elevated left atrial pressure.   2. No LV thrombus seen   3. Right ventricular systolic function is normal. The right ventricular  size is normal. Tricuspid regurgitation signal is inadequate for assessing  PA pressure.   4. The mitral valve is normal in structure. Trivial mitral valve  regurgitation.   5. The aortic valve was not well visualized. Aortic valve regurgitation  is not visualized. No aortic stenosis is present.   6. The inferior vena cava is normal in size with greater than 50%  respiratory variability,  suggesting right atrial pressure of 3 mmHg.   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  EKG:  No EKG today.   Recent Labs: 11/20/2020: B Natriuretic Peptide 1,581.0; Magnesium 2.1; TSH 0.978 11/25/2020: ALT 52; Hemoglobin 10.3; Platelets 427 11/30/2020: BUN 18; Creatinine, Ser 1.24; Potassium 3.4; Sodium 138  Recent Lipid Panel    Component Value Date/Time   CHOL 186 09/28/2020 0404   TRIG 100 09/28/2020 0404   HDL 36 (L) 09/28/2020 0404   CHOLHDL 5.2 09/28/2020 0404   VLDL 20 09/28/2020 0404   LDLCALC 130 (H) 09/28/2020 0404    Home Medications   Current Meds  Medication Sig   acetaminophen (TYLENOL) 325 MG tablet Take 2 tablets (650 mg total) by mouth every 4 (four) hours as needed for headache or mild pain.   aspirin 81 MG EC tablet Take 1 tablet (81 mg total) by mouth daily. Swallow whole.   famotidine (PEPCID) 20 MG tablet Take 20 mg by mouth daily.   lisinopril (ZESTRIL) 20 MG tablet Take 1 tablet (20 mg total) by mouth daily.  nitroGLYCERIN (NITROSTAT) 0.4 MG SL tablet Place 1 tablet (0.4 mg total) under the tongue every 5 (five) minutes x 3 doses as needed for chest pain.   tiZANidine (ZANAFLEX) 2 MG tablet Take 1 tablet (2 mg total) by mouth 3 (three) times daily.   [DISCONTINUED] carvedilol (COREG) 3.125 MG tablet Take 1 tablet (3.125 mg total) by mouth 2 (two) times daily with a meal.   [DISCONTINUED] furosemide (LASIX) 40 MG tablet Take 0.5 tablets (20 mg total) by mouth daily.   [DISCONTINUED] furosemide (LASIX) 40 MG tablet Take 40 mg by mouth daily.   [DISCONTINUED] lisinopril (ZESTRIL) 10 MG tablet Take 1 tablet (10 mg total) by mouth daily.   [DISCONTINUED] rosuvastatin (CRESTOR) 40 MG tablet Take 1  tablet (40 mg total) by mouth daily.   [DISCONTINUED] spironolactone (ALDACTONE) 25 MG tablet Take 0.5 tablets (12.5 mg total) by mouth daily.   [DISCONTINUED] ticagrelor (BRILINTA) 90 MG TABS tablet Take 1 tablet (90 mg total) by mouth 2 (two) times daily.    Review of Systems    All other systems reviewed and are otherwise negative except as noted above.  Physical Exam    VS:  BP (!) 168/80   Pulse 63   Ht 5\' 3"  (1.6 m)   Wt 138 lb (62.6 kg)   SpO2 99%   BMI 24.45 kg/m  , BMI Body mass index is 24.45 kg/m.  Wt Readings from Last 3 Encounters:  12/10/20 138 lb (62.6 kg)  11/30/20 138 lb 3.2 oz (62.7 kg)  11/22/20 138 lb 14.2 oz (63 kg)    GEN: Well nourished, well developed, in no acute distress. HEENT: normal. Neck: Supple, no JVD, carotid bruits, or masses. Cardiac: RRR, no murmurs, rubs, or gallops. No clubbing, cyanosis, edema.  Radials/PT 2+ and equal bilaterally.  Respiratory:  Respirations regular and unlabored, clear to auscultation bilaterally. GI: Soft, nontender, nondistended. MS: No deformity or atrophy. Skin: Warm and dry, no rash. Neuro:  Strength and sensation are intact. Psych: Normal affect.  Assessment & Plan    CAD - Stable with no anginal symptoms. No indication for ischemic evaluation.  09/2020 DES to LAD. Recommended for DAPT for at least 1 year. Tolerating Brilinta/Aspirin without bleeding. SW able to get MedAssist for 90 day supply of Brilinta. Patient assistance through AZ&Me received temporary approval and [employer] will reach out to patient to confirm enrollment status. Additional GDMT includes Coreg, Rosuvastatin. She has [employer] giftcards provided from SW team to assist with medication costs today.  HTN - Not at goal. Per admission records BP goal 130-150 given significantly stenotic posterior circulation. Increase Lisinopril to 20mg  QD with repeat BMP in 1 week at [facility] office due to proximity.   HFrEF/ICM - Euvolemic and well compensated on exam.  GDMT includes Lisinopril, Lasix, Spironolactone, Coreg. Future considerations include increased dose Spironolactone. Note BP goal 130-150 as above. Defer transition to [facility] or addition of SGLT2i until Medicaid obtained due to cost. Plan to repeat echo 3 month after maximally tolerated GDMT.  History of CVA - Upcoming follow up with neurology. Working to establish with Medicaid to then establish with home health for continued strengthening. To rule out atrial fibrillation as cardioembolic source of CVA 14 day ZIO placed in clinic today. Continue BP, lipid control, aspirin.  Disposition: Follow up in 6 week(s) with Dr. Martinique or APP. Will also ask Dr. Martinique if okay if patient may transfer to [facility] office for easier transport.  Signed, Loel Dubonnet, NP 12/10/2020, 5:33 PM  [facility]

## 2020-12-10 NOTE — Progress Notes (Signed)
Heart and Vascular Care Navigation  12/10/2020  Tracey Meyer 1960/08/18 [id number]  Reason for Referral:  F/u on assistance applications from [facility] appt.  Engaged with patient face to face for follow up visit for Heart and Vascular Care Coordination.                                                                                                   Assessment:      LCSW was able to meet w/ pt during appt at [facility] clinic. I introduced myself, role, reason for visit. They recall meeting Tracey Meyer during [name] visit. Pt brought her meds with her, utilized [employer] to get to appt. Pt is at appt today w/ her significant other Tracey Meyer. Confirmed they live together and home address on file. Pt utilizing walker to get around as able. Confirmed home number on file. She and her partner rely on his SSI checks to make ends meet.          Pt did not bring assistance applications with her so we completed a copy together for [employer]ist. Pt aware that not all medications are part of this program so she will need to choose a pharmacy for other refills. Pt and partner request they be sent to the [place]. Pt has some assistance available already w/ [facility] gift cards. We discussed that her Brilinta was temporarily approved and will be mailed to her home address.   Pt given information about PCP appt to establish care upcoming. I also provided her with her case ID # for disability along with DDS number and encouraged her to call after appointments so that notes can be appropriately sent to DDS. Pt Medicaid still pending per [employer] counselors. May be 60-90 days before determination made.  If pt not approved then she will have alternate options to assist with her previous medical bills/ongoing medical care needs.              HRT/VAS Care Coordination     Patients Home Cardiology Office [place] Team Social Worker   Social Worker Name: [name], [place]   Living arrangements for the past 2 months Single Family Home   Lives with: Significant Other   Patient Current Insurance Coverage Medicaid Pending   Patient Has Concern With Paying Medical Bills Yes   Patient Concerns With Medical Bills ongoing medical w/u uninsured   Medical Bill Referrals: medicaid pending   Does Patient Have Prescription Coverage? No   Patient Prescription Assistance Programs [place] [employer]ist; Patient Assistance Programs   [place] [facility] Medications carvedilol, furosemide, lisinopril, spironolactone   Home Assistive Devices/Equipment Cane (specify quad or straight); Walker (specify type); Wheelchair; Shower chair with back; Bedside commode/3-in-1   DME Agency NA   Current home services DME  raised toilet seat, shower chair       Social History:  SDOH Screenings   Alcohol Screen: Low Risk    Last Alcohol Screening Score (AUDIT): 0  Depression (PHQ2-9): Medium Risk   PHQ-2 Score: 5  Financial Resource Strain: High Risk   Difficulty of Paying Living Expenses: Very hard  Food Insecurity: Food Insecurity Present   Worried About Charity fundraiser in the Last Year: Sometimes true   Ran Out of Food in the Last Year: Sometimes true  Housing: Low Risk    Last Housing Risk Score: 0  Physical Activity: Not on file  Social Connections: Not on file  Stress: Not on file  Tobacco Use: Medium Risk   Smoking Tobacco Use: Former   Smokeless Tobacco Use: Never  Transportation Needs: Public librarian (Medical): Yes   Lack of Transportation (Non-Medical): Yes    SDOH Interventions: Financial Resources:  Financial Strain Interventions: Other (Comment) (sent NCMedAssist app; discussed utilizing [place]) [place] for Disability application assistance  Transportation:   Transportation Interventions: Financial planner     Other Care  Navigation Interventions:     Provided Pharmacy assistance resources [place] [facility], Patient Assistance Programs  Patient expressed [place] concerns No.   Follow-up plan:   LCSW will send in application for [employer]ist. Once I have a determination for that then she can have her medications listed sent to their mail pharmacy. I have made a note to f/u in late November with PAP if she still does not have Medicaid approved. LCSW will f/u with pt and pt partner again at the end of the week to ensure they were able to pick up medications and answer any additional questions. I remain available to assist w/ ongoing needs and pt/pt partner have my contact information.

## 2020-12-11 ENCOUNTER — Telehealth: Payer: Self-pay | Admitting: Licensed Clinical Social Worker

## 2020-12-11 ENCOUNTER — Telehealth (HOSPITAL_BASED_OUTPATIENT_CLINIC_OR_DEPARTMENT_OTHER): Payer: Self-pay | Admitting: Family

## 2020-12-11 MED ORDER — TICAGRELOR 90 MG PO TABS: 90.0000 mg | ORAL_TABLET | Freq: Two times a day (BID) | ORAL | 3 refills | Status: DC

## 2020-12-11 NOTE — Telephone Encounter (Signed)
Received fax from [place] (Brilinta patient assistance) that patient is temporarily approved. Per fax AZ[employer]ME will "continue assessing patient's eligibility and confirm final enrollment status separately".   Loel Dubonnet, NP

## 2020-12-11 NOTE — Telephone Encounter (Signed)
LCSW sent application for N[employer]edAssist securely to [email or web address].  Await determination.  [name], MSW, [place]  [phone]

## 2020-12-12 ENCOUNTER — Telehealth: Payer: Self-pay | Admitting: Licensed Clinical Social Worker

## 2020-12-12 NOTE — Telephone Encounter (Signed)
LCSW received approval email for pt from [place] is eligible for the following medications to be mailed to her for free from program through 11/22/2021: - aspirin - carvedilol - furosemide - lisinopril - spironolactone  She will still need to get the following medications through [facility] which will likely be the most accessible option due to transportation/financial challenges: - acetaminophen  - famotidine - nitroglycerin - rosuvastatin calcium - tizandine  She was also approved for the following Patient Assistance Programs on a temporary basis: - [place] and [place] for Brilinta through 02/2021  I attempted to f/u with pt to ensure she still plans on going to PCP appt tomorrow to establish care and to inform her of the above. I have mailed the information to pt home address as well.   [name], MSW, [place]  [phone]

## 2020-12-13 ENCOUNTER — Inpatient Hospital Stay: Payer: Self-pay | Admitting: Family Medicine

## 2020-12-13 ENCOUNTER — Telehealth: Payer: Self-pay | Admitting: Licensed Clinical Social Worker

## 2020-12-13 MED ORDER — SPIRONOLACTONE 25 MG PO TABS: 12.5000 mg | ORAL_TABLET | Freq: Every day | ORAL | 3 refills | Status: DC

## 2020-12-13 MED ORDER — FUROSEMIDE 40 MG PO TABS: 40.0000 mg | ORAL_TABLET | Freq: Every day | ORAL | 3 refills | Status: DC

## 2020-12-13 MED ORDER — LISINOPRIL 20 MG PO TABS: 20.0000 mg | ORAL_TABLET | Freq: Every day | ORAL | 3 refills | Status: DC

## 2020-12-13 MED ORDER — CARVEDILOL 3.125 MG PO TABS: 3.1250 mg | ORAL_TABLET | Freq: Two times a day (BID) | ORAL | 3 refills | Status: DC

## 2020-12-13 MED ORDER — ASPIRIN 81 MG PO TBEC: 81.0000 mg | DELAYED_RELEASE_TABLET | Freq: Every day | ORAL | 3 refills | Status: AC

## 2020-12-13 NOTE — Addendum Note (Signed)
Addended by: Loel Dubonnet on: 12/13/2020 07:54 AM   Modules accepted: Orders

## 2020-12-13 NOTE — Telephone Encounter (Signed)
Received call back from pt this afternoon [phone]).  Reintroduced self, role, reason for call. Pt has rescheduled her PCP appointment that was set for today. She has called transportation services to schedule that ride, I will f/u closer to the date to ensure it was arranged. I again explained NCMedAssist program and that some of her medications would be coming from that program. LCSW shared that I have mailed a list of her medications and how to order refills from different programs. I also am following for pt Medicaid as it is pending at this time. Encouraged her and significant other [name] to call me if needed.   [name], MSW, [place]  [phone]

## 2020-12-13 NOTE — Telephone Encounter (Signed)
Meds sent to [place] as requested.   Loel Dubonnet, NP

## 2020-12-18 ENCOUNTER — Inpatient Hospital Stay: Payer: MEDICAID | Admitting: Adult Health

## 2020-12-24 ENCOUNTER — Telehealth: Payer: Self-pay | Admitting: Licensed Clinical Social Worker

## 2020-12-24 NOTE — Telephone Encounter (Signed)
LCSW also reached out to Financial Counseling dept rep Shanon Rosser.  Pt Medicaid application still pending with [place] is listed as Tracey Meyer. I attempted to reach her at [phone], no answer- message left requesting call back. Apparently, there may be outstanding document needs and I would like to assist pt with completing that request if possible.   [name], MSW, [place]  [phone]

## 2020-12-24 NOTE — Telephone Encounter (Signed)
LCSW reached out to Transportation Services to ensure ride scheduled for upcoming appt on 10/6 to establish with PCP. Pt had stated she had requested ride when cancelling previous appt but per Transportation Services it was no on their queue. LCSW made formal request for ride, pt will be called the day before to confirm ride. Remain available as needed for any additional questions/concerns.   [name], MSW, [place]  [phone]

## 2020-12-24 NOTE — Telephone Encounter (Signed)
Received call back from [facility] w/ [place].  She shares that she does not have any outstanding documents missing on her end but shares they are waiting on DDS determination and mentions perhaps they may be missing documents. LCSW attempted x3 to reach DDS. Unfortunately, phone lines not working at this time. I will re-attempt to reach out tomorrow.   [name], MSW, [place]  [phone]

## 2020-12-25 ENCOUNTER — Other Ambulatory Visit: Payer: Self-pay | Admitting: *Deleted

## 2020-12-25 ENCOUNTER — Telehealth: Payer: Self-pay | Admitting: Licensed Clinical Social Worker

## 2020-12-25 DIAGNOSIS — I63139 Cerebral infarction due to embolism of unspecified carotid artery: Secondary | ICD-10-CM

## 2020-12-25 NOTE — Telephone Encounter (Signed)
LCSW called pt at [phone]- pt significant other [name] answered phone. He placed phone on speakerphone and I was able to let he and the pt know about ride scheduled for appt on 10/6. Pt understands they will call her tomorrow to confirm ride for Thursday. Pt and pt significant other plan to mail back the monitor she had received during her appt w/ [facility]. If they have any difficulty they will bring it to PCP office and see if they can assist them with mailing it back. Encouraged them to call me back if any additional questions/concerns.   [name], MSW, [place]  [phone]

## 2020-12-27 ENCOUNTER — Ambulatory Visit (INDEPENDENT_AMBULATORY_CARE_PROVIDER_SITE_OTHER): Payer: Self-pay | Admitting: Family Medicine

## 2020-12-27 ENCOUNTER — Encounter: Payer: Self-pay | Admitting: Family Medicine

## 2020-12-27 ENCOUNTER — Other Ambulatory Visit: Payer: Self-pay

## 2020-12-27 VITALS — BP 157/90 | HR 88 | Temp 98.7°F | Resp 16 | Ht 64.0 in | Wt 149.0 lb

## 2020-12-27 DIAGNOSIS — Z7689 Persons encountering health services in other specified circumstances: Secondary | ICD-10-CM

## 2020-12-27 DIAGNOSIS — I10 Essential (primary) hypertension: Secondary | ICD-10-CM

## 2020-12-27 DIAGNOSIS — I252 Old myocardial infarction: Secondary | ICD-10-CM

## 2020-12-27 DIAGNOSIS — Z8673 Personal history of transient ischemic attack (TIA), and cerebral infarction without residual deficits: Secondary | ICD-10-CM

## 2020-12-28 ENCOUNTER — Encounter: Payer: Self-pay | Admitting: Family Medicine

## 2020-12-28 NOTE — Progress Notes (Signed)
New Patient Office Visit  Subjective:  Patient ID: Tracey Meyer, female    DOB: 02/19/1961  Age: 60 y.o. MRN: [id number]  CC: No chief complaint on file.   HPI Tracey Meyer presents for to establish care.  Patient is also here for hospital follow-up.  Patient reports that she had undiagnosed extremely elevated hypertension which led to CVA and an MRI.  She has residual left-sided weakness.  She is under the management of cardiology to manage her blood pressures at this time.  She reports that she also will be starting therapy for her left-sided weakness.  She reports daily improvement.  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 09/22/20  09/24/2020   Stroke ([place])    Tobacco abuse 09/24/2020   Transaminitis 09/24/2020    Past Surgical History:  Procedure Laterality Date   BUBBLE STUDY  10/02/2020   Procedure: BUBBLE STUDY;  Surgeon: Lelon Perla, MD;  Location: [facility] ENDOSCOPY;  Service: Cardiovascular;;   CORONARY/GRAFT ACUTE MI REVASCULARIZATION N/A 09/22/2020   Procedure: Coronary/Graft Acute MI Revascularization;  Surgeon: Martinique, Peter M, MD;  Location: [place] CV LAB;  Service: Cardiovascular;  Laterality: N/A;   LEFT HEART CATH AND CORONARY ANGIOGRAPHY N/A 09/22/2020   Procedure: LEFT HEART CATH AND CORONARY ANGIOGRAPHY;  Surgeon: Martinique, Peter M, MD;  Location: [place] CV LAB;  Service: Cardiovascular;  Laterality: N/A;   mastectomy Left    at age 72 due to concerns of cancer   TEE WITHOUT CARDIOVERSION N/A 10/02/2020   Procedure: TRANSESOPHAGEAL ECHOCARDIOGRAM (TEE);  Surgeon: Lelon Perla, MD;  Location: [facility] ENDOSCOPY;  Service: Cardiovascular;  Laterality: N/A;    Family History  Problem Relation Age of Onset   Cancer Mother    Lupus  Sister    Heart disease Brother    Cancer Maternal Aunt     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    Comment: pending disability as of 11/21/20  Tobacco Use   Smoking status: Former    Packs/day: 0.50    Years: 45.00    Pack years: 22.50    Types: Cigarettes    Quit date: 09/22/2020    Years since quitting: 0.2   Smokeless tobacco: Never  Vaping Use   Vaping Use: Never used  Substance and Sexual Activity   Alcohol use: Not Currently   Drug use: Never   Sexual activity: Not on file  Other Topics Concern   Not on file  Social History Narrative   Not on file   Social Determinants of Health   Financial Resource Strain: High Risk   Difficulty of Paying Living Expenses: Very hard  Food Insecurity: Food Insecurity Present   Worried About Running Out of Food in the Last Year: Sometimes true   Ran Out of Food in the Last Year: Sometimes true  Transportation Needs: Unmet Transportation Needs   Lack of Transportation (Medical): Yes   Lack of Transportation (Non-Medical): Yes  Physical Activity: Not on file  Stress: Not on  file  Social Connections: Not on file  Intimate Partner Violence: Not on file    ROS Review of Systems  Cardiovascular:  Negative for chest pain.  Neurological:  Positive for weakness.  All other systems reviewed and are negative.  Objective:   Today's Vitals: BP (!) 157/90   Pulse 88   Temp 98.7 F (37.1 C) (Oral)   Resp 16   Ht 5\' 4"  (1.626 m)   Wt 149 lb (67.6 kg)   SpO2 95%   BMI 25.58 kg/m   Physical Exam Vitals and nursing note reviewed.  Constitutional:      General: She is not in acute distress. Cardiovascular:     Rate and Rhythm: Normal rate and regular rhythm.  Pulmonary:     Effort: Pulmonary effort is normal.      Breath sounds: Normal breath sounds.  Abdominal:     Palpations: Abdomen is soft.     Tenderness: There is no abdominal tenderness.  Musculoskeletal:     Comments: Patient utilizing wheelchair/walker  Neurological:     Mental Status: She is alert and oriented to person, place, and time.     Motor: Weakness (left-sided) present.    Assessment & Plan:   1. Uncontrolled hypertension Elevated readings.  Management as per consultant.  Keep scheduled upcoming appointment.  2. History of CVA (cerebrovascular accident) Some left-sided weakness but improving daily.  Patient is scheduled to begin an therapy in the near future.  3. History of MI (myocardial infarction) Management as per consultant.  4. Encounter to establish care   Outpatient Encounter Medications as of 12/27/2020  Medication Sig   acetaminophen (TYLENOL) 325 MG tablet Take 2 tablets (650 mg total) by mouth every 4 (four) hours as needed for headache or mild pain.   aspirin 81 MG EC tablet Take 1 tablet (81 mg total) by mouth daily. Swallow whole.   carvedilol (COREG) 3.125 MG tablet Take 1 tablet (3.125 mg total) by mouth 2 (two) times daily with a meal.   furosemide (LASIX) 40 MG tablet Take 1 tablet (40 mg total) by mouth daily.   lisinopril (ZESTRIL) 20 MG tablet Take 1 tablet (20 mg total) by mouth daily.   nitroGLYCERIN (NITROSTAT) 0.4 MG SL tablet Place 1 tablet (0.4 mg total) under the tongue every 5 (five) minutes x 3 doses as needed for chest pain.   rosuvastatin (CRESTOR) 40 MG tablet Take 1 tablet (40 mg total) by mouth daily.   spironolactone (ALDACTONE) 25 MG tablet Take 0.5 tablets (12.5 mg total) by mouth daily.   ticagrelor (BRILINTA) 90 MG TABS tablet Take 1 tablet (90 mg total) by mouth 2 (two) times daily.   famotidine (PEPCID) 20 MG tablet Take 20 mg by mouth daily. (Patient not taking: Reported on 12/27/2020)   No facility-administered encounter medications on file as of 12/27/2020.    Follow-up:  Return in about 3 months (around 03/29/2021) for follow up.   Becky Sax, MD

## 2020-12-31 ENCOUNTER — Telehealth: Payer: Self-pay | Admitting: Licensed Clinical Social Worker

## 2020-12-31 NOTE — Telephone Encounter (Signed)
LCSW arranged ride for pt for pt to get to appt rescheduled for 10/17 at [facility]. Remain available to assist pt as needed- Medicaid still pending at this time.   [name], MSW, [place]  [phone]

## 2021-01-02 ENCOUNTER — Inpatient Hospital Stay: Payer: MEDICAID | Admitting: Adult Health

## 2021-01-07 ENCOUNTER — Other Ambulatory Visit: Payer: Self-pay

## 2021-01-07 ENCOUNTER — Encounter: Payer: Self-pay | Admitting: Adult Health

## 2021-01-07 ENCOUNTER — Ambulatory Visit: Payer: Self-pay | Admitting: Adult Health

## 2021-01-07 VITALS — BP 176/96 | HR 85 | Ht 64.0 in | Wt 143.0 lb

## 2021-01-07 DIAGNOSIS — E782 Mixed hyperlipidemia: Secondary | ICD-10-CM

## 2021-01-07 DIAGNOSIS — E1159 Type 2 diabetes mellitus with other circulatory complications: Secondary | ICD-10-CM

## 2021-01-07 DIAGNOSIS — I10 Essential (primary) hypertension: Secondary | ICD-10-CM

## 2021-01-07 DIAGNOSIS — I63433 Cerebral infarction due to embolism of bilateral posterior cerebral arteries: Secondary | ICD-10-CM

## 2021-01-07 MED ORDER — BACLOFEN 10 MG PO TABS: 10.0000 mg | ORAL_TABLET | Freq: Two times a day (BID) | ORAL | 5 refills | Status: DC | PRN

## 2021-01-07 NOTE — Progress Notes (Signed)
[facility] [address] [place] [address] [phone]       HOSPITAL FOLLOW UP NOTE  [name] Date of Birth:  [birth year] Medical Record Number:  [id number]   Reason for Referral:  hospital stroke follow up    SUBJECTIVE:   CHIEF COMPLAINT:  Chief Complaint  Patient presents with   Follow-up    RM 2 with spouse Tracey Meyer  Pt is having L sided weakness and pain,  easily fatigued and SOB     HPI:   Tracey Meyer is a 60 y.o. female with PMH significant for DM2, tobacco use and trying to quit, HLD, CAD s/p angio and stenting of mLAD d/c'd on 09/24/2020 who presented on 09/27/2020 with L sided weakness that started after recent discharge. She has also been having shortness of breath and it gets worse with lying flat.  Stroke work-up revealed scattered infarcts (involving bilateral cerebral and cerebellar hemispheres with largest infarct right occipital) likely cardioembolic secondary to cardiomyopathy with low EF.  Also evidence of remote lacunar infarcts involving left pons and pons. MRA neck showed left ICA 70% stenosis although CTA head/neck showed left V4 moderate stenosis only - no significant left ICA stenosis.  2D echo 30 to 35% down from 35 to 40% on 7/2.  TEE no evidence of LV or LA thrombus or PFO.  Recommended 30-day cardiac event monitor to rule out atrial fibrillation.  LDL 130.  A1c 6.8.  Resumed home dose aspirin and Brilinta and increase home dose Crestor from 10 mg to 40 mg daily.  Current heavy tobacco use with smoking cessation counseling provided.  Therapy eval's recommended discharge to CIR.  Presents today, 01/07/2021, for hospital follow-up accompanied by her spouse, Tracey Meyer.  Reports residual left-sided weakness and gait impairment but unfortunately unable to participate in therapies due to lack of insurance (currently Medicaid pending).  She has been trying to do exercises at home.  Over the past few days, feels like left  upper arm and left hamstring has been getting tight with increased pain. Use of RW for short distance. Very supportive husband who assists as needed.  Denies new or worsening stroke/TIA symptoms.  Compliant on aspirin and Brilinta as well as Crestor without side effects.  Blood pressure today elevated -she does not routinely monitor home as she states she needs to get batteries (although similar story with cardiology appt 1 month ago).   She was rehospitalized on 11/20/2020 for progressive shortness of breath with CHF exacerbation with elevated BP, CXR bilateral pulmonary edema and BNP 1581.  Treated with IV Lasix and per note, suspicion of medication non compliance.   Currently being followed by cardiology social work for any needed resources and has appointment in November for Medicaid and [place] disability     [place] 09/28/2020 MRA HEAD/NECK IMPRESSION: MRI HEAD: 1. 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 Given the various vascular distributions involved, a central thromboembolic etiology is suspected. 2. Underlying moderate chronic microvascular ischemic disease with a few scattered remote lacunar infarcts involving the left thalamus and pons.   MRA HEAD: 1. Technically limited exam due to motion artifact. 2. Negative intracranial MRA for large vessel occlusion. Intracranial atherosclerotic disease with associated moderate stenoses involving the distal left V4 segment, distal basilar artery, and cavernous right ICA as above.  3. 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   MRA NECK: 1. Technically limited exam due to motion artifact. 2. Approximate 70% atheromatous stenosis at the origin of the left ICA. 3. Otherwise negative MRA of  the neck. No other hemodynamically significant or correctable stenosis identified.  CTA HEAD/NECK 10/01/2020 IMPRESSION: The aorta has a normal appearance.   Mild atherosclerotic plaque at both carotid bifurcations but without stenosis on either side. No evidence of ulceration or pronounced irregularity.   Mild atherosclerotic change at the carotid siphons but without stenosis.   No intracranial large or medium vessel occlusion in the anterior circulation.   Right vertebral artery either terminates in PICA or is occluded distal to PICA. Left vertebral artery shows a moderate stenosis of the distal V4 segment but does remain patent to the basilar. Mild atherosclerotic irregularity of the proximal basilar artery.        ROS:   14 system review of systems performed and negative with exception of those listed in HPI  PMH:  Past Medical History:  Diagnosis Date   Cardiomyopathy, ischemic 09/24/2020   DM (diabetes mellitus), type 2 ([place]) 09/24/2020   HTN (hypertension)    Hyperlipidemia    Myocardial infarction [facility])    Prediabetes    S/P angioplasty with stent to mLAD 09/22/20  09/24/2020   Stroke ([place])    Tobacco abuse 09/24/2020   Transaminitis 09/24/2020    PSH:  Past Surgical History:  Procedure Laterality Date   BUBBLE STUDY  10/02/2020   Procedure: BUBBLE STUDY;  Surgeon: Lelon Perla, MD;  Location: [facility] ENDOSCOPY;  Service: Cardiovascular;;   CORONARY/GRAFT ACUTE MI REVASCULARIZATION N/A 09/22/2020   Procedure: Coronary/Graft Acute MI Revascularization;  Surgeon: Martinique, Peter M, MD;  Location: [place] CV LAB;  Service: Cardiovascular;  Laterality: N/A;   LEFT HEART CATH AND CORONARY ANGIOGRAPHY N/A 09/22/2020   Procedure: LEFT HEART CATH AND CORONARY ANGIOGRAPHY;  Surgeon: Martinique, Peter M, MD;  Location: [place] CV LAB;  Service: Cardiovascular;  Laterality: N/A;   mastectomy Left    at age 12 due to concerns of cancer   TEE WITHOUT CARDIOVERSION  N/A 10/02/2020   Procedure: TRANSESOPHAGEAL ECHOCARDIOGRAM (TEE);  Surgeon: Lelon Perla, MD;  Location: 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    Comment: pending disability as of 11/21/20  Tobacco Use   Smoking status: Former    Packs/day: 0.50    Years: 45.00    Pack years: 22.50    Types: Cigarettes    Quit date: 09/22/2020    Years since quitting: 0.2   Smokeless tobacco: Never  Vaping Use   Vaping Use: Never used  Substance and Sexual Activity   Alcohol use: Not Currently   Drug use: Never   Sexual activity: Not on file  Other Topics Concern   Not on file  Social History Narrative   Not on file   Social Determinants of Health   Financial Resource Strain: High Risk   Difficulty of Paying Living Expenses: Very hard  Food Insecurity: Food Insecurity Present   Worried About [place] in the Last Year:  Sometimes true   Ran Out of Food in the Last Year: Sometimes true  Transportation Needs: Public librarian (Medical): Yes   Lack of Transportation (Non-Medical): Yes  Physical Activity: Not on file  Stress: Not on file  Social Connections: Not on file  Intimate Partner Violence: Not on file    Family History:  Family History  Problem Relation Age of Onset   Cancer Mother    Lupus Sister    Heart disease Brother    Cancer Maternal Aunt     Medications:   Current Outpatient Medications on File Prior to Visit  Medication Sig Dispense Refill   acetaminophen (TYLENOL) 325 MG tablet Take 2 tablets (650 mg total) by mouth every 4 (four) hours as needed for headache or mild pain.     aspirin 81 MG EC tablet Take 1 tablet (81  mg total) by mouth daily. Swallow whole. 90 tablet 3   carvedilol (COREG) 3.125 MG tablet Take 1 tablet (3.125 mg total) by mouth 2 (two) times daily with a meal. 180 tablet 3   furosemide (LASIX) 40 MG tablet Take 1 tablet (40 mg total) by mouth daily. 90 tablet 3   lisinopril (ZESTRIL) 20 MG tablet Take 1 tablet (20 mg total) by mouth daily. 90 tablet 3   nitroGLYCERIN (NITROSTAT) 0.4 MG SL tablet Place 1 tablet (0.4 mg total) under the tongue every 5 (five) minutes x 3 doses as needed for chest pain. 25 tablet 0   rosuvastatin (CRESTOR) 40 MG tablet Take 1 tablet (40 mg total) by mouth daily. 30 tablet 5   spironolactone (ALDACTONE) 25 MG tablet Take 0.5 tablets (12.5 mg total) by mouth daily. 45 tablet 3   ticagrelor (BRILINTA) 90 MG TABS tablet Take 1 tablet (90 mg total) by mouth 2 (two) times daily. 180 tablet 3   No current facility-administered medications on file prior to visit.    Allergies:  No Known Allergies    OBJECTIVE:  Physical Exam  Vitals:   01/07/21 1416  BP: (!) 176/96  Pulse: 85  Weight: 143 lb (64.9 kg)  Height: 5\' 4"  (1.626 m)   Body mass index is 24.55 kg/m. No results found.  Post stroke PHQ 2/9 Depression screen PHQ 2/9 12/27/2020  Decreased Interest 0  Down, Depressed, Hopeless 1  PHQ - 2 Score 1  Altered sleeping 1  Tired, decreased energy 0  Change in appetite 0  Feeling bad or failure about yourself  2  Trouble concentrating 0  Moving slowly or fidgety/restless 1  Suicidal thoughts 0  PHQ-9 Score 5     General: well developed, well nourished, pleasant middle-age Caucasian female, seated, in no evident distress Head: head normocephalic and atraumatic.   Neck: supple with no carotid or supraclavicular bruits Cardiovascular: regular rate and rhythm, no murmurs Musculoskeletal: no deformity Skin:  no rash/petichiae Vascular:  Normal pulses all extremities   Neurologic Exam Mental Status: Awake and fully alert.  Mild dysarthria.   Oriented to place and time. Recent and remote memory intact. Attention span, concentration and fund of knowledge appropriate. Mood and affect appropriate.  Cranial Nerves: Fundoscopic exam reveals sharp disc margins. Pupils equal, briskly reactive to light. Extraocular movements full without nystagmus. Visual fields full to confrontation. Hearing intact. Facial sensation intact.  Mild left lower facial weakness.  Tongue, palate moves normally and symmetrically.  Motor: Normal strength, bulk and tone right upper and lower extremity.  LUE: 4/5 increased tone greater distally,  LLE: 3/5 HF, 4/5 knee extension, 3/5 knee flexion, and foot drop with AFO brace; increased tone throughout  Sensory.: intact to touch , pinprick , position and vibratory sensation although subjective left upper and lower extremity numbness Coordination: Rapid alternating movements normal on left side. Finger-to-nose and heel-to-shin performed accurately on left side. Gait and Station: deferred to AD not present during visit Reflexes: brisk LUE and LLE extremity, 1+ RUE and RLE. Toes downgoing.     NIHSS  3 Modified Rankin  3-4      ASSESSMENT: AERIANNA LOSEY is a 60 y.o. year old female with scattered infarcts likely cardioembolic secondary to cardiomyopathy with low EF on 09/27/2020. Vascular risk factors include HTN, HLD, DM, cardiomyopathy, CHF CAD and NSTMI s/p angio and stenting of mLAD 09/22/2020.      PLAN:  B/l cerebral and cerebellar infarcts:  Residual deficit: Left spastic hemiparesis with paresthesias and gait impairment. Start baclofen with gradual titration (5mg  first few nights, then can increase to BID dosing, if needed, can further increase to 10mg  BID as tolerated).  Start therapies once Medicaid approved.  Continued use of RW at all times unless otherwise instructed. Cardiac monitor negative for atrial fibrillation Continue aspirin 81 mg daily and Brilinta (ticagrelor) 90 mg bid  and Crestor 40 mg  daily for secondary stroke prevention.  Discussed secondary stroke prevention measures and importance of close PCP follow up for aggressive stroke risk factor management. I have gone over the pathophysiology of stroke, warning signs and symptoms, risk factors and their management in some detail with instructions to go to the closest emergency room for symptoms of concern. NSTEMI s/p stent: Followed by cardiology on aspirin and Brilinta and statin Cardiomyopathy: Recent EF 30 to 35% followed by cardiology on Coreg, Entresto and spironolactone Left carotid Stenosis: MRA L ICA 70% stenosis although not evidence of stenosis on CTA. Plans to establish care with vascular surgery HTN: BP goal <130/90.  Uncontrolled. Prior med adjustments by cardiology 1 month ago.  Discussed importance of routinely monitoring at home HLD: LDL goal <70. Recent LDL 130.  Continue Crestor 40 mg daily DMII: A1c goal<7.0. Recent A1c 6.8.  Currently diet controlled per PCP    Follow up in 4-5 months or call earlier if needed   CC:  [place] provider: Dr. Leonie Man PCP: Dorna Mai, MD    I spent 57 minutes of face-to-face and non-face-to-face time with patient and husband.  This included previsit chart review including review of recent hospitalization, lab review, study review, order entry, electronic health record documentation, patient and husband education regarding recent stroke including etiology, secondary stroke prevention measures and importance of managing stroke risk factors, residual deficits and typical recovery time and answered all other questions to patient satisfaction  Frann Rider, AGNP-BC  [facility] [address] [place], [place] [address]  Phone [phone] Fax [phone] Note: This document was prepared with digital dictation and possible smart phrase technology. Any transcriptional errors that result from this process are unintentional.

## 2021-01-07 NOTE — Patient Instructions (Addendum)
Start therapies once medicaid approved - if orders are needed, please let me know  Trial baclofen 5 mg (half tab) nightly for a few nights. If tolerating okay, can increase to taking a half tab twice daily. If symptoms still persist, can further increase to 10mg  twice daily   Continue aspirin 81 mg daily and Brilinta (ticagrelor) 90 mg bid  and Crestor 40mg  daily  for secondary stroke prevention  Continue to follow up with PCP./cardiology regarding cholesterol and blood pressure management  Maintain strict control of hypertension with blood pressure goal below 130/90 and cholesterol with LDL cholesterol (bad cholesterol) goal below 70 mg/dL.      Followup in the future with me in 4-5 months or call earlier if needed      Thank you for coming to see [place] at [facility]. I hope we have been able to provide you high quality care today.  You may receive a patient satisfaction survey over the next few weeks. We would appreciate your feedback and comments so that we may continue to improve ourselves and the health of our patients.

## 2021-01-08 ENCOUNTER — Telehealth: Payer: Self-pay | Admitting: Licensed Clinical Social Worker

## 2021-01-08 ENCOUNTER — Ambulatory Visit: Payer: Self-pay

## 2021-01-08 ENCOUNTER — Encounter (HOSPITAL_COMMUNITY): Payer: Self-pay

## 2021-01-08 NOTE — Telephone Encounter (Signed)
LCSW reached out to see if pt had scheduled ride to appts today. Per [employer] she has not. She has two appts at [facility] office, I have requested that we see if we can get them transportation as she usually attends appts with her significant other [name]. LCSW also called pt to ensure she had not arranged alternate transportation. No answer at [phone].  [name], MSW, [place]  [phone]

## 2021-01-08 NOTE — Telephone Encounter (Signed)
Pt called back and shares she was not aware of appts today and will need to reschedule. Transportation also cannot accomodate her ride since she is >1hr away from the clinic. I provided [facility] number and requested she call immediately to reschedule her appt. I then requested that she call Transportation Services with the new date and time. Pt called back and let me know that she had a new time, confirmed she had the Transportation Services card to get appt scheduled. Provided her with appt information next week and address. Pt plans to call and get ride scheduled today.  Remain available to assist as needed.   [name], MSW, [place]  [phone]

## 2021-01-10 NOTE — Progress Notes (Signed)
I agree with the above plan 

## 2021-01-14 ENCOUNTER — Telehealth: Payer: Self-pay | Admitting: Licensed Clinical Social Worker

## 2021-01-14 NOTE — Telephone Encounter (Signed)
Was able to reach pt via telephone this afternoon, she states she "arranged transportation last Thursday but when I called today they said they were full." Inquired if pt had any other options for rides. She denies having alternate ride. I provided her with Dr. Reather Littler clinic number and requested her to call again immediately to get her appt changed and then to call Transportation Services immediately after to get a ride since with her current address when will need to get it scheduled early. Pt states understanding, I remain available as needed.   [name], MSW, [place]  [phone]

## 2021-01-14 NOTE — Telephone Encounter (Signed)
LCSW reached out to pt this morning to ensure ride scheduled for appt tomorrow/to remind her that she is responsible for getting those scheduled prior to appts given distance from home to appts. No answer at [phone]. I left reminder along with Transportation Services number to get scheduled. Will f/u again around lunch to see if completed.   [name], MSW, [place]  [phone]

## 2021-01-15 ENCOUNTER — Encounter: Payer: Medicaid Other | Admitting: Physical Medicine & Rehabilitation

## 2021-01-24 ENCOUNTER — Telehealth: Payer: Self-pay | Admitting: Licensed Clinical Social Worker

## 2021-01-24 NOTE — Telephone Encounter (Signed)
LCSW attempted to reach pt this morning to confirm she is aware of upcoming [facility] appt on 11/7 and remind her that she needs to schedule a ride. LCSW also noted that pt has been approved for [place] Managed Medicaid under [place] which unfortunately is not in network with [place]. I was unable to reach her at [phone], left message. I also mailed her more information about how to change her Medicaid plan should she be interested in continuing to see her current providers.   [name], MSW, [place]  [phone]

## 2021-01-25 ENCOUNTER — Telehealth: Payer: Self-pay | Admitting: Licensed Clinical Social Worker

## 2021-01-25 NOTE — Telephone Encounter (Signed)
LCSW attempted to reach pt again to remind her of upcoming appt on Monday w/ Heartcare at [facility]. Phone went straight to voicemail. Left message w/ reminder and provided Transportation Services number to remind her to schedule ride. Since I have not confirmed appt with pt I do not feel comfortable scheduling ride. Also would like to discuss her new Medicaid enrollment. Remain available as needed.   [name], MSW, [place]  [phone]

## 2021-01-27 NOTE — Progress Notes (Signed)
Office Visit    Patient Name: Tracey Meyer Date of Encounter: 01/28/2021  PCP:  Dorna Mai, MD   [place]  Cardiologist:  Shirlee More, MD  Advanced Practice Provider:  No care team member to display Electrophysiologist:  None   Chief Complaint    Tracey Meyer is a 60 y.o. female with a hx of CAD s/p DES to LAD, CVA, HTN, HLD, HFrEF/ICM presents today for follow up of CAD and heart failure.   Past Medical History    Past Medical History:  Diagnosis Date   Cardiomyopathy, ischemic 09/24/2020   DM (diabetes mellitus), type 2 ([place]) 09/24/2020   HTN (hypertension)    Hyperlipidemia    Myocardial infarction [facility])    Prediabetes    S/P angioplasty with stent to mLAD 09/22/20  09/24/2020   Stroke ([place])    Tobacco abuse 09/24/2020   Transaminitis 09/24/2020   Past Surgical History:  Procedure Laterality Date   BUBBLE STUDY  10/02/2020   Procedure: BUBBLE STUDY;  Surgeon: Lelon Perla, MD;  Location: [facility] ENDOSCOPY;  Service: Cardiovascular;;   CORONARY/GRAFT ACUTE MI REVASCULARIZATION N/A 09/22/2020   Procedure: Coronary/Graft Acute MI Revascularization;  Surgeon: Martinique, Peter M, MD;  Location: [place] CV LAB;  Service: Cardiovascular;  Laterality: N/A;   LEFT HEART CATH AND CORONARY ANGIOGRAPHY N/A 09/22/2020   Procedure: LEFT HEART CATH AND CORONARY ANGIOGRAPHY;  Surgeon: Martinique, Peter M, MD;  Location: [place] CV LAB;  Service: Cardiovascular;  Laterality: N/A;   mastectomy Left    at age 48 due to concerns of cancer   TEE WITHOUT CARDIOVERSION N/A 10/02/2020   Procedure: TRANSESOPHAGEAL ECHOCARDIOGRAM (TEE);  Surgeon: Lelon Perla, MD;  Location: [facility] ENDOSCOPY;  Service: Cardiovascular;  Laterality: N/A;    Allergies  No Known Allergies  History of Present Illness    Tracey Meyer is a 60 y.o. female with a hx of CAD s/p DES to LAD, CVA, HTN, HLD, HFrEF/ICM last seen 12/10/20.  She had anterior STEMI  09/2020 s/p PCI to LAD with newly diagnosed ischemic cardiomyopathy LVEF 30-35%. She was readmitted 3 days after discharge from STEMI with CVA with left sided weakness. TEE without LV thrombus, ASD, or LAA thrombus. GDMT titration was difficult given significantly stenotic posterior circulation with recommendation for BP goal 130-150. She was discharged to [facility] 11/20/20.   Re admitted 11/20/20 with CHF exacerbation. BP >200, CXR bilateral pulmonary edema, BNP 1581. Treated with IV Lasix.   Seen in TOC follow up and started on Spironolactone with reduced dose of Lasix. Her renal function and electrolytes remained stable.   Seen in follow up 12/10/20 with her significant other. She was gradually increasing activity though not participating in PT due to lack of insurance. Her Lisinopril dose was increased due to uncontrolled hypertension. 14 day ZIO placed to rule out atrial fib as contributory to stroke. Social work consulted as she was without insurance. ZIO monitor was without evidence of atrial fib. She was approved for Medicaid.   She presents today for follow up with her significant other. She is thrilled to have been approved for Medicaid. Not checking BP at home. Reports strength and mobility improving after CVA. She recently started PT. Reports no shortness of breath at rest and mild dyspnea on exertion with more htan usual ativity. She has been able to increase her activity tolerance since last seen. Reports no chest pain, pressure, or tightness. No edema, orthopnea, PND. Reports no palpitations.  EKGs/Labs/Other Studies Reviewed:   The following studies were reviewed today:  Long term monitor 12/31/20 Normal sinus rhythm Rare PACs with 8 brief runs of PAT. longest 12 beats One 4 beat run NSVT Symptoms on one occasion during sinus tachycardia  Echo 09/22/20:  1. Apex well-visualized without contrast - false tendon (normal variant)  - no thrombus. Left ventricular ejection fraction, by  estimation, is 35 to  40%. The left ventricle has moderately decreased function. The left  ventricle demonstrates regional wall  motion abnormalities (see scoring diagram/findings for description). There  is mild left ventricular hypertrophy. Left ventricular diastolic  parameters are consistent with Grade I diastolic dysfunction (impaired  relaxation). Elevated left ventricular  end-diastolic pressure. There is severe hypokinesis of the left  ventricular, entire anterior wall, anteroseptal wall, apical segment and  inferoapical segment. Findings suggest LAD territory ischemia/infarct.   2. Right ventricular systolic function is normal. The right ventricular  size is normal. There is normal pulmonary artery systolic pressure.   3. The mitral valve is abnormal. Mild mitral valve regurgitation.   4. The aortic valve is tricuspid. Aortic valve regurgitation is not  visualized.   5. The inferior vena cava is normal in size with <50% respiratory  variability, suggesting right atrial pressure of 8 mmHg.   LHC 09/22/20: RPDA lesion is 50% stenosed. Dist Cx lesion is 30% stenosed with 30% stenosed side branch in LPAV. 1st Diag lesion is 60% stenosed. Mid LAD lesion is 100% stenosed. Post intervention, there is a 0% residual stenosis. A drug-eluting stent was successfully placed using a STENT ONYX FRONTIER 2.5X38. LV end diastolic pressure is mildly elevated.   1. 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 Mildly elevated LVEDP 21 mmHg 3. Successful PCI of the mid LAD with DES x 1   Diagnostic Dominance: Co-dominant     Intervention         Echocardiogram 09/28/20: 1. Left ventricular ejection fraction, by estimation, is 30 to 35%. The  left ventricle has moderately decreased function. The left ventricle  demonstrates regional wall motion abnormalities (see scoring  diagram/findings for description). There is mild  left ventricular hypertrophy. Left ventricular  diastolic parameters are  consistent with Grade II diastolic dysfunction (pseudonormalization).  Elevated left atrial pressure.   2. No LV thrombus seen   3. Right ventricular systolic function is normal. The right ventricular  size is normal. Tricuspid regurgitation signal is inadequate for assessing  PA pressure.   4. The mitral valve is normal in structure. Trivial mitral valve  regurgitation.   5. The aortic valve was not well visualized. Aortic valve regurgitation  is not visualized. No aortic stenosis is present.   6. The inferior vena cava is normal in size with greater than 50%  respiratory variability, suggesting right atrial pressure of 3 mmHg.   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  EKG:  No EKG today.   Recent Labs: 11/20/2020: B Natriuretic Peptide 1,581.0; Magnesium 2.1; TSH 0.978 11/25/2020: ALT 52; Hemoglobin 10.3; Platelets 427 11/30/2020: BUN 18; Creatinine, Ser 1.24; Potassium 3.4; Sodium 138  Recent Lipid Panel    Component Value Date/Time   CHOL 186 09/28/2020 0404   TRIG 100 09/28/2020 0404   HDL 36 (L) 09/28/2020 0404   CHOLHDL 5.2 09/28/2020  0404   VLDL 20 09/28/2020 0404   LDLCALC 130 (H) 09/28/2020 0404    Home Medications   Current Meds  Medication Sig   acetaminophen (TYLENOL) 325 MG tablet Take 2 tablets (650 mg total) by mouth every 4 (four) hours as needed for headache or mild pain.   aspirin 81 MG EC tablet Take 1 tablet (81 mg total) by mouth daily. Swallow whole.   nitroGLYCERIN (NITROSTAT) 0.4 MG SL tablet Place 1 tablet (0.4 mg total) under the tongue every 5 (five) minutes x 3 doses as needed for chest pain.   sacubitril-valsartan (ENTRESTO) 49-51 MG Take 1 tablet by mouth 2  (two) times daily.   [DISCONTINUED] carvedilol (COREG) 3.125 MG tablet Take 1 tablet (3.125 mg total) by mouth 2 (two) times daily with a meal.   [DISCONTINUED] furosemide (LASIX) 40 MG tablet Take 1 tablet (40 mg total) by mouth daily.   [DISCONTINUED] lisinopril (ZESTRIL) 20 MG tablet Take 1 tablet (20 mg total) by mouth daily.   [DISCONTINUED] rosuvastatin (CRESTOR) 40 MG tablet Take 1 tablet (40 mg total) by mouth daily.   [DISCONTINUED] spironolactone (ALDACTONE) 25 MG tablet Take 0.5 tablets (12.5 mg total) by mouth daily.   [DISCONTINUED] ticagrelor (BRILINTA) 90 MG TABS tablet Take 1 tablet (90 mg total) by mouth 2 (two) times daily.    Review of Systems    All other systems reviewed and are otherwise negative except as noted above.  Physical Exam    VS:  BP (!) 150/90   Pulse 75   Ht 5\' 4"  (1.626 m)   Wt 145 lb (65.8 kg) Comment: per patient unable to stand due to left sided weakness  SpO2 98%   BMI 24.89 kg/m  , BMI Body mass index is 24.89 kg/m.  Wt Readings from Last 3 Encounters:  01/28/21 145 lb (65.8 kg)  01/07/21 143 lb (64.9 kg)  12/27/20 149 lb (67.6 kg)    GEN: Well nourished, well developed, in no acute distress. HEENT: normal. Neck: Supple, no JVD, carotid bruits, or masses. Cardiac: RRR, no murmurs, rubs, or gallops. No clubbing, cyanosis, edema.  Radials/PT 2+ and equal bilaterally.  Respiratory:  Respirations regular and unlabored, clear to auscultation bilaterally. GI: Soft, nontender, nondistended. MS: No deformity or atrophy. Skin: Warm and dry, no rash. Neuro:  Strength and sensation are intact. Psych: Normal affect.  Assessment & Plan    CAD - Stable with no anginal symptoms. No indication for ischemic evaluation.  09/2020 DES to LAD. Recommended for DAPT for at least 1 year. Tolerating Brilinta/Aspirin without bleeding. Refills of cardiac medicaitons sent to [facility] as she was recently approved for Medicaid. Heart healthy diet and regular  cardiovascular exercise encouraged.    HTN - Not at goal. Per admission records BP goal 130-150 given significantly stenotic posterior circulation. Stop Lisinopril. In 3 days, start Entresto 49-51mg  BID. This will ensure BP goal and optimization of HF therapy. Home monitoring encouraged.   HLD with LDl goal <70 - Has been out of Crestor for 1 week. Will plan to repeat lipid panel at follow up. [facility]ills [facility] to [facility] as she has been approved for Medicaid. Denies myalgias.   DM2 - Presently diet controlled. Continue to follow with PCP.   Left carotid artery stenosis - MRA L ICA 70% stenosed though not evidence of stenosis on CT. Has upcoming follow up and carotid duplex with vascular surgery.  HFrEF/ICM - 7/12/2 EF 30-35% by TEE.  Euvolemic and well compensated on exam. NYHA  II. For optimization of medical therapy, will stop Lisinopril and after 3 days start Entresto. BMP today. GDMT includes Entresto, Lasix, Spironolactone, Coreg. Future considerations include increased dose Spironolactoneor SGLT2i. Note BP goal 130-150 as above. Plan to repeat echo 3 month after maximally tolerated GDMT.   History of CVA - Follows with neurology. ZIO 11/2020 with no atrial fib.. Continue BP, lipid control, aspirin.  Disposition: Follow up in 2 months with Dr. Bettina Gavia (okay to transition to [facility] office per Dr. Martinique due to proximity)  Of note - her Medicaid plan is not in network with [facility]. She would like to continue care with our team and as such plans to contact Medicaid to switch her plan. Phone number provided to patient.   Signed, Loel Dubonnet, NP 01/28/2021, 7:33 PM [facility]

## 2021-01-28 ENCOUNTER — Encounter (HOSPITAL_BASED_OUTPATIENT_CLINIC_OR_DEPARTMENT_OTHER): Payer: Self-pay | Admitting: Family

## 2021-01-28 ENCOUNTER — Ambulatory Visit (INDEPENDENT_AMBULATORY_CARE_PROVIDER_SITE_OTHER): Payer: Medicaid Other | Admitting: Family

## 2021-01-28 ENCOUNTER — Other Ambulatory Visit: Payer: Self-pay

## 2021-01-28 VITALS — BP 150/90 | HR 75 | Ht 64.0 in | Wt 145.0 lb

## 2021-01-28 DIAGNOSIS — I25118 Atherosclerotic heart disease of native coronary artery with other forms of angina pectoris: Secondary | ICD-10-CM | POA: Diagnosis not present

## 2021-01-28 DIAGNOSIS — I255 Ischemic cardiomyopathy: Secondary | ICD-10-CM

## 2021-01-28 DIAGNOSIS — I10 Essential (primary) hypertension: Secondary | ICD-10-CM

## 2021-01-28 DIAGNOSIS — E785 Hyperlipidemia, unspecified: Secondary | ICD-10-CM | POA: Diagnosis not present

## 2021-01-28 DIAGNOSIS — Z8673 Personal history of transient ischemic attack (TIA), and cerebral infarction without residual deficits: Secondary | ICD-10-CM

## 2021-01-28 MED ORDER — SPIRONOLACTONE 25 MG PO TABS: 12.5000 mg | ORAL_TABLET | Freq: Every day | ORAL | 3 refills | Status: DC

## 2021-01-28 MED ORDER — CARVEDILOL 3.125 MG PO TABS: 3.1250 mg | ORAL_TABLET | Freq: Two times a day (BID) | ORAL | 1 refills | Status: DC

## 2021-01-28 MED ORDER — TICAGRELOR 90 MG PO TABS: 90.0000 mg | ORAL_TABLET | Freq: Two times a day (BID) | ORAL | 3 refills | Status: DC

## 2021-01-28 MED ORDER — ROSUVASTATIN CALCIUM 40 MG PO TABS: 40.0000 mg | ORAL_TABLET | Freq: Every day | ORAL | 1 refills | Status: DC

## 2021-01-28 MED ORDER — ENTRESTO 49-51 MG PO TABS: 1.0000 | ORAL_TABLET | Freq: Two times a day (BID) | ORAL | 5 refills | Status: DC

## 2021-01-28 MED ORDER — FUROSEMIDE 40 MG PO TABS: 40.0000 mg | ORAL_TABLET | Freq: Every day | ORAL | 3 refills | Status: DC

## 2021-01-28 NOTE — Patient Instructions (Signed)
Medication Instructions:  Your physician has recommended you make the following change in your medication:   STOP Lisinopril  On Friday, START Entresto one 49-51mg  tablet twice per day.  *If you need a refill on your cardiac medications before your next appointment, please call your pharmacy*   Lab Work: Your physician recommends that you return for lab work today for [facility]  If you have labs (blood work) drawn today and your tests are completely normal, you will receive your results only by: MyChart Message (if you have MyChart) OR A paper copy in the mail If you have any lab test that is abnormal or we need to change your treatment, we will call you to review the results.  Testing/Procedures: None ordered today.    Follow-Up: At [facility], you and your health needs are our priority.  As part of our continuing mission to provide you with exceptional heart care, we have created designated Provider Care Teams.  These Care Teams include your primary Cardiologist (physician) and Advanced Practice Providers (APPs -  Physician Assistants and Nurse Practitioners) who all work together to provide you with the care you need, when you need it.  We recommend signing up for the patient portal called "MyChart".  Sign up information is provided on this After Visit Summary.  MyChart is used to connect with patients for Virtual Visits (Telemedicine).  Patients are able to view lab/test results, encounter notes, upcoming appointments, etc.  Non-urgent messages can be sent to your provider as well.   To learn more about what you can do with MyChart, go to [email or web address].    Your next appointment:   2 month(s)  The format for your next appointment:   In Person  Provider:   Shirlee More, MD   Other Instructions  Heart Healthy Diet Recommendations: A low-salt diet is recommended. Meats should be grilled, baked, or boiled. Avoid fried foods. Focus on lean protein sources like fish or  chicken with vegetables and fruits. The American Heart Association is a Microbiologist!  American Heart Association Diet and Lifeystyle Recommendations    Exercise recommendations: The American Heart Association recommends 150 minutes of moderate intensity exercise weekly. Try 30 minutes of moderate intensity exercise 4-5 times per week. This could include walking, jogging, or swimming.   Drink less than 2 liters of fluid each day.

## 2021-02-05 ENCOUNTER — Ambulatory Visit (INDEPENDENT_AMBULATORY_CARE_PROVIDER_SITE_OTHER): Payer: Medicaid Other | Admitting: Physician Assistant

## 2021-02-05 ENCOUNTER — Ambulatory Visit (HOSPITAL_COMMUNITY)
Admission: RE | Admit: 2021-02-05 | Discharge: 2021-02-05 | Disposition: A | Discharge status: Home / Self Care | Payer: Medicaid Other | Source: Ambulatory Visit | Attending: Vascular Surgery | Admitting: Vascular Surgery

## 2021-02-05 ENCOUNTER — Other Ambulatory Visit: Payer: Self-pay

## 2021-02-05 VITALS — BP 136/78 | HR 74 | Temp 98.3°F | Resp 18 | Ht 64.0 in | Wt 145.0 lb

## 2021-02-05 DIAGNOSIS — I63139 Cerebral infarction due to embolism of unspecified carotid artery: Secondary | ICD-10-CM | POA: Diagnosis not present

## 2021-02-05 DIAGNOSIS — I6523 Occlusion and stenosis of bilateral carotid arteries: Secondary | ICD-10-CM

## 2021-02-05 NOTE — Progress Notes (Signed)
Stent Carotid Artery Follow-Up   VASCULAR SURGERY ASSESSMENT & PLAN:   Tracey Meyer is a 60 y.o. female seen in consultation by Dr. Carlis Abbott on 10/01/2020. MRA of the neck estimated up to 70% short segment stenosis of the left ICA.  Bilateral carotid artery stenosis: The patient has no symptoms referable to carotid artery stenosis.  Duplex examination today reveals approximately 40 to 59% gnosis of the left ICA and approximately 1 to 39% stenosis of the right ICA.  Bilateral common carotid artery stenoses are also noted.  We reviewed the signs and symptoms of stroke/TIA and advised the patient to call EMS should these occur.   Continue optimal medical management of hypertension and follow-up with primary care physician. Encouraged continuation of complete smoking cessation. Continue the following medications: statin and aspirin. She is on ticagrelor. Follow-up in 1 year with carotid duplex ultrasound.  SUBJECTIVE:   The patient denies monocular blindness, slurred speech, facial drooping. She has residual LUE and LLE weakness, but this is improving. Her husband accompanies her today.  " 60 year old female that vascular surgery has been consulted for 70% left ICA stenosis.  Patient was admitted recently with STEMI on 09/21/2020 and underwent heart cath with drug-eluting stent to the LAD with severely reduced ejection fraction of 35 to 40%.  She states after discharge on July 4 she developed profound left-sided weakness.  She was readmitted for stroke work-up.  MRI has shown bilateral cerebral and cerebellar infarcts.  On exam she has profound weakness of the left upper and lower extremity.  She denies any previous history of strokes or TIAs.  I discussed that carotid disease can certainly be an etiology for strokes but given her bilateral infarcts in both cerebellar and cerebral regions, I do not think this is the etiology for her stroke.  Right ICA only mild 20% stenosis."   PHYSICAL EXAM:    Vitals:   02/05/21 1324 02/05/21 1327  BP: 126/73 136/78  Pulse: 74   Resp: 18   Temp: 98.3 F (36.8 C)   TempSrc: Oral   SpO2: 99%   Weight: 145 lb (65.8 kg)   Height: 5\' 4"  (1.626 m)     General appearance: Well-developed, well-nourished in no apparent distress Neurologic: Alert and oriented x4, face symmetric, speech fluent.  Residual left-sided extremity weakness.  She is able to rise from a sitting to standing position unaided. Cardiovascular: Heart rate and rhythm are regular.  Right carotid bruit. Respirations: Nonlabored   NON-INVASIVE VASCULAR STUDIES   02/05/2021 Carotid duplex: Summary:  Right Carotid: Velocities in the right ICA are consistent with a 1-39%  stenosis. The ECA appears >50% stenosed.   Left Carotid: Velocities in the left ICA are consistent with a 40-59%  stenosis.  Non-hemodynamically significant plaque <50% noted in the  CCA. The ECA appears >50% stenosed.   Vertebrals:  Bilateral vertebral arteries demonstrate antegrade flow.  Subclavians: Normal flow hemodynamics were seen in bilateral subclavian arteries.   *See table(s) above for measurements and observations.       Preliminary     10/01/2020 CTA neck: Right carotid system: Common carotid artery widely patent to the bifurcation. There is calcified plaque at the carotid bifurcation but without stenosis.   Left carotid system: Common carotid artery widely patent to the bifurcation. There is calcified plaque at the carotid bifurcation and ICA bulb but no stenosis.   Vertebral arteries: Both vertebral artery origins are widely patent. Both vertebral arteries appear normal through the cervical region to the  foramen magnum.  09/28/2020 MRA neck: Right carotid system: Right CCA patent from its origin to the bifurcation without stenosis. Atheromatous irregularity seen about the right carotid bulb/proximal right ICA with no more than mild 20% stenosis by NASCET criteria. Right ICA  patent distally without stenosis, evidence for dissection, or occlusion.   Left carotid system: Left CCA patent from its origin to the bifurcation without stenosis. Atheromatous irregularity with up to 70% short-segment stenosis at the origin of the left ICA (series 1060, image 3). Left ICA patent distally without additional stenosis, evidence for dissection or occlusion.   Vertebral arteries: Both vertebral arteries arise from subclavian arteries. Vertebral arteries are patent without hemodynamically significant stenosis. No evidence for dissection or occlusion.    PROBLEM LIST:    The patient's past medical history, past surgical history, family history, social history, allergy list and medication list are reviewed.   CURRENT MEDS:    Current Outpatient Medications:    acetaminophen (TYLENOL) 325 MG tablet, Take 2 tablets (650 mg total) by mouth every 4 (four) hours as needed for headache or mild pain., Disp: , Rfl:    aspirin 81 MG EC tablet, Take 1 tablet (81 mg total) by mouth daily. Swallow whole., Disp: 90 tablet, Rfl: 3   baclofen (LIORESAL) 10 MG tablet, Take 1 tablet (10 mg total) by mouth 2 (two) times daily as needed for muscle spasms. (Patient not taking: Reported on 01/28/2021), Disp: 30 each, Rfl: 5   carvedilol (COREG) 3.125 MG tablet, Take 1 tablet (3.125 mg total) by mouth 2 (two) times daily with a meal., Disp: 180 tablet, Rfl: 1   furosemide (LASIX) 40 MG tablet, Take 1 tablet (40 mg total) by mouth daily., Disp: 90 tablet, Rfl: 3   nitroGLYCERIN (NITROSTAT) 0.4 MG SL tablet, Place 1 tablet (0.4 mg total) under the tongue every 5 (five) minutes x 3 doses as needed for chest pain., Disp: 25 tablet, Rfl: 0   rosuvastatin (CRESTOR) 40 MG tablet, Take 1 tablet (40 mg total) by mouth daily., Disp: 90 tablet, Rfl: 1   sacubitril-valsartan (ENTRESTO) 49-51 MG, Take 1 tablet by mouth 2 (two) times daily., Disp: 60 tablet, Rfl: 5   spironolactone (ALDACTONE) 25 MG tablet,  Take 0.5 tablets (12.5 mg total) by mouth daily., Disp: 45 tablet, Rfl: 3   ticagrelor (BRILINTA) 90 MG TABS tablet, Take 1 tablet (90 mg total) by mouth 2 (two) times daily., Disp: 180 tablet, Rfl: 3   REVIEW OF SYSTEMS:   [X]  denotes positive finding, [ ]  denotes negative finding Cardiac  Comments:  Chest pain or chest pressure:    Shortness of breath upon exertion:    Short of breath when lying flat:    Irregular heart rhythm:        Vascular    Pain in calf, thigh, or hip brought on by ambulation:    Pain in feet at night that wakes you up from your sleep:     Blood clot in your veins:    Leg swelling:         Pulmonary    Oxygen at home:    Productive cough:     Wheezing:         Neurologic    Sudden weakness in arms or legs:     Sudden numbness in arms or legs:     Sudden onset of difficulty speaking or slurred speech:    Temporary loss of vision in one eye:     Problems with dizziness:  Gastrointestinal    Blood in stool:     Vomited blood:         Genitourinary    Burning when urinating:     Blood in urine:        Psychiatric    Major depression:         Hematologic    Bleeding problems:    Problems with blood clotting too easily:        Skin    Rashes or ulcers:        Constitutional    Fever or chills:     Barbie Banner, PA-C  Office: [phone] 02/05/2021 Dr. Carlis Abbott

## 2021-03-01 ENCOUNTER — Encounter: Payer: Medicaid Other | Attending: Registered Nurse | Admitting: Physical Medicine & Rehabilitation

## 2021-03-01 DIAGNOSIS — I5042 Chronic combined systolic (congestive) and diastolic (congestive) heart failure: Secondary | ICD-10-CM | POA: Insufficient documentation

## 2021-03-01 DIAGNOSIS — R0600 Dyspnea, unspecified: Secondary | ICD-10-CM | POA: Insufficient documentation

## 2021-03-01 DIAGNOSIS — I2102 ST elevation (STEMI) myocardial infarction involving left anterior descending coronary artery: Secondary | ICD-10-CM | POA: Insufficient documentation

## 2021-03-01 DIAGNOSIS — I10 Essential (primary) hypertension: Secondary | ICD-10-CM | POA: Insufficient documentation

## 2021-03-11 ENCOUNTER — Telehealth: Payer: Self-pay | Admitting: Licensed Clinical Social Worker

## 2021-03-11 NOTE — Telephone Encounter (Signed)
LCSW notes pt able to change Medicaid to in network [facility] Medicaid; disability pending. She has been connected w/ Chiropractor.  I remain available should in future pt need any additional assistance for questions/concerns.   [name], MSW, [place]  [phone]- work cell phone (preferred) [phone]- desk phone

## 2021-03-12 ENCOUNTER — Telehealth: Payer: Self-pay | Admitting: Licensed Clinical Social Worker

## 2021-03-12 NOTE — Telephone Encounter (Signed)
Mailed pt [place] packet for possible assistance through county for Tracey Meyer. Remain available.   [name], MSW, [place]  [phone]- work cell phone (preferred) [phone]- desk phone

## 2021-03-29 ENCOUNTER — Ambulatory Visit: Payer: Self-pay | Admitting: Family Medicine

## 2021-04-03 ENCOUNTER — Telehealth: Payer: Self-pay | Admitting: Family Medicine

## 2021-04-03 NOTE — Telephone Encounter (Signed)
Patient given afternoon appt for paperwork for nurse aide

## 2021-04-03 NOTE — Telephone Encounter (Signed)
Pt asking to speak w/ Dr. Redmond Pulling regarding obtaining Home Care at her residence before her appt on 04/12/2021.Thank you.

## 2021-04-12 ENCOUNTER — Ambulatory Visit: Payer: Self-pay | Admitting: Family Medicine

## 2021-04-15 ENCOUNTER — Ambulatory Visit: Payer: Self-pay | Admitting: Family Medicine

## 2021-04-24 ENCOUNTER — Ambulatory Visit: Payer: Medicaid Other | Admitting: Cardiology

## 2021-04-26 ENCOUNTER — Ambulatory Visit (INDEPENDENT_AMBULATORY_CARE_PROVIDER_SITE_OTHER): Payer: Medicaid Other | Admitting: Family Medicine

## 2021-04-26 ENCOUNTER — Other Ambulatory Visit: Payer: Self-pay

## 2021-04-26 ENCOUNTER — Encounter: Payer: Self-pay | Admitting: Family Medicine

## 2021-04-26 VITALS — BP 145/84 | HR 70 | Temp 97.8°F | Resp 16 | Wt 146.6 lb

## 2021-04-26 DIAGNOSIS — I255 Ischemic cardiomyopathy: Secondary | ICD-10-CM | POA: Diagnosis not present

## 2021-04-26 DIAGNOSIS — R531 Weakness: Secondary | ICD-10-CM

## 2021-04-26 DIAGNOSIS — I69998 Other sequelae following unspecified cerebrovascular disease: Secondary | ICD-10-CM

## 2021-04-26 DIAGNOSIS — I25118 Atherosclerotic heart disease of native coronary artery with other forms of angina pectoris: Secondary | ICD-10-CM | POA: Diagnosis not present

## 2021-04-26 DIAGNOSIS — E1159 Type 2 diabetes mellitus with other circulatory complications: Secondary | ICD-10-CM | POA: Diagnosis not present

## 2021-04-26 DIAGNOSIS — Z23 Encounter for immunization: Secondary | ICD-10-CM

## 2021-04-26 DIAGNOSIS — I10 Essential (primary) hypertension: Secondary | ICD-10-CM | POA: Diagnosis not present

## 2021-04-26 LAB — POCT GLYCOSYLATED HEMOGLOBIN (HGB A1C): Hemoglobin A1C: 7.8 % — AB (ref 4.0–5.6)

## 2021-04-26 MED ORDER — FUROSEMIDE 40 MG PO TABS: 40.0000 mg | ORAL_TABLET | Freq: Every day | ORAL | 0 refills | Status: DC

## 2021-04-26 MED ORDER — ROSUVASTATIN CALCIUM 40 MG PO TABS: 40.0000 mg | ORAL_TABLET | Freq: Every day | ORAL | 0 refills | Status: DC

## 2021-04-26 MED ORDER — ENTRESTO 49-51 MG PO TABS: 1.0000 | ORAL_TABLET | Freq: Two times a day (BID) | ORAL | 2 refills | Status: DC

## 2021-04-26 MED ORDER — BACLOFEN 10 MG PO TABS: 10.0000 mg | ORAL_TABLET | Freq: Two times a day (BID) | ORAL | 5 refills | Status: DC | PRN

## 2021-04-26 MED ORDER — SPIRONOLACTONE 25 MG PO TABS: 12.5000 mg | ORAL_TABLET | Freq: Every day | ORAL | 0 refills | Status: DC

## 2021-04-26 MED ORDER — TICAGRELOR 90 MG PO TABS: 90.0000 mg | ORAL_TABLET | Freq: Two times a day (BID) | ORAL | 0 refills | Status: DC

## 2021-04-26 NOTE — Progress Notes (Signed)
Patient is here for her CPE. Patient would like  to talk having a CNA.   Patient is concern about having pain on her left side. Patient has no other concerns today

## 2021-04-30 ENCOUNTER — Encounter: Payer: Self-pay | Admitting: Family Medicine

## 2021-04-30 NOTE — Progress Notes (Signed)
Established Patient Office Visit  Subjective:  Patient ID: Tracey Meyer, female    DOB: [birth year]  Age: 61 y.o. MRN: [id number]  CC:  Chief Complaint  Patient presents with   Follow-up   Hypertension   Diabetes   Annual Exam    HPI Tracey Meyer presents for follow up of chronic med issues including diabetes and hypertension. She also notes improvement of the weakness s/p stroke. She had not been able to get therapy 2/2 insurance but that is now resolved.   Past Medical History:  Diagnosis Date   Cancer ([place])    Cardiomyopathy, ischemic 09/24/2020   DM (diabetes mellitus), type 2 ([place]) 09/24/2020   HTN (hypertension)    Hyperlipidemia    Myocardial infarction [facility])    Prediabetes    S/P angioplasty with stent to mLAD 09/22/20  09/24/2020   Stroke ([place])    Tobacco abuse 09/24/2020   Transaminitis 09/24/2020    Past Surgical History:  Procedure Laterality Date   BUBBLE STUDY  10/02/2020   Procedure: BUBBLE STUDY;  Surgeon: Lelon Perla, MD;  Location: [facility] ENDOSCOPY;  Service: Cardiovascular;;   CORONARY/GRAFT ACUTE MI REVASCULARIZATION N/A 09/22/2020   Procedure: Coronary/Graft Acute MI Revascularization;  Surgeon: Martinique, Peter M, MD;  Location: [place] CV LAB;  Service: Cardiovascular;  Laterality: N/A;   LEFT HEART CATH AND CORONARY ANGIOGRAPHY N/A 09/22/2020   Procedure: LEFT HEART CATH AND CORONARY ANGIOGRAPHY;  Surgeon: Martinique, Peter M, MD;  Location: [place] CV LAB;  Service: Cardiovascular;  Laterality: N/A;   mastectomy Left    at age 76 due to concerns of cancer   TEE WITHOUT CARDIOVERSION N/A 10/02/2020   Procedure: TRANSESOPHAGEAL ECHOCARDIOGRAM (TEE);  Surgeon: Lelon Perla, MD;  Location: [facility] ENDOSCOPY;  Service: Cardiovascular;  Laterality: N/A;    Family History  Problem Relation Age of Onset   Cancer Mother    Lupus Sister    Heart disease Brother    Cancer Maternal Aunt     Social History   Socioeconomic History    Marital status: Single    Spouse name: Not on file   Number of children: 0   Years of education: Not on file   Highest education level: High school graduate  Occupational History   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    Comment: pending disability as of 11/21/20  Tobacco Use   Smoking status: Some Days    Packs/day: 0.50    Years: 45.00    Pack years: 22.50    Types: Cigarettes    Last attempt to quit: 09/22/2020    Years since quitting: 0.6   Smokeless tobacco: Never   Tobacco comments:    Pt states she puffs on cigarettes but don't inhale  Vaping Use   Vaping Use: Never used  Substance and Sexual Activity   Alcohol use: Not Currently   Drug use: Never   Sexual activity: Not on file  Other Topics Concern   Not on file  Social History Narrative   Not on file   Social Determinants of Health   Financial Resource Strain: High Risk   Difficulty of Paying Living Expenses: Very hard  Food Insecurity: Food Insecurity Present   Worried About Running Out of Food in the Last Year: Sometimes true   Ran Out of Food in the Last Year: Sometimes true  Transportation Needs: Unmet Transportation Needs   Lack of Transportation (Medical): Yes   Lack of Transportation (Non-Medical): Yes  Physical Activity: Not  on file  Stress: Not on file  Social Connections: Not on file  Intimate Partner Violence: Not on file    ROS Review of Systems  Cardiovascular: Negative.   Neurological:  Positive for weakness. Negative for speech difficulty.  All other systems reviewed and are negative.  Objective:   Today's Vitals: BP (!) 145/84    Pulse 70    Temp 97.8 F (36.6 C) (Oral)    Resp 16    Wt 146 lb 9.6 oz (66.5 kg)    SpO2 97%    BMI 25.16 kg/m   Physical Exam Vitals and nursing note reviewed.  Constitutional:      General: She is not in acute distress. Cardiovascular:     Rate and Rhythm: Normal rate and regular rhythm.  Pulmonary:     Effort: Pulmonary effort is normal.     Breath  sounds: Normal breath sounds.  Abdominal:     Palpations: Abdomen is soft.     Tenderness: There is no abdominal tenderness.  Musculoskeletal:     Comments: Patient utilizing wheelchair/walker  Neurological:     Mental Status: She is alert and oriented to person, place, and time.     Motor: Weakness (left-sided) present.    Assessment & Plan:   1. Essential hypertension Continue present management and monitor. Meds refilled.   2. Coronary artery disease of native artery of native heart with stable angina pectoris ([place]) Meds refilled. Management as per consultant.   - furosemide (LASIX) 40 MG tablet; Take 1 tablet (40 mg total) by mouth daily.  Dispense: 90 tablet; Refill: 0 - rosuvastatin (CRESTOR) 40 MG tablet; Take 1 tablet (40 mg total) by mouth daily.  Dispense: 90 tablet; Refill: 0 - spironolactone (ALDACTONE) 25 MG tablet; Take 0.5 tablets (12.5 mg total) by mouth daily.  Dispense: 45 tablet; Refill: 0 - ticagrelor (BRILINTA) 90 MG TABS tablet; Take 1 tablet (90 mg total) by mouth 2 (two) times daily.  Dispense: 180 tablet; Refill: 0 - sacubitril-valsartan (ENTRESTO) 49-51 MG; Take 1 tablet by mouth 2 (two) times daily.  Dispense: 60 tablet; Refill: 2  3. Ischemic cardiomyopathy Meds refilled. Management as per consultant.   - furosemide (LASIX) 40 MG tablet; Take 1 tablet (40 mg total) by mouth daily.  Dispense: 90 tablet; Refill: 0 - rosuvastatin (CRESTOR) 40 MG tablet; Take 1 tablet (40 mg total) by mouth daily.  Dispense: 90 tablet; Refill: 0 - spironolactone (ALDACTONE) 25 MG tablet; Take 0.5 tablets (12.5 mg total) by mouth daily.  Dispense: 45 tablet; Refill: 0 - ticagrelor (BRILINTA) 90 MG TABS tablet; Take 1 tablet (90 mg total) by mouth 2 (two) times daily.  Dispense: 180 tablet; Refill: 0 - sacubitril-valsartan (ENTRESTO) 49-51 MG; Take 1 tablet by mouth 2 (two) times daily.  Dispense: 60 tablet; Refill: 2  4. Type 2 diabetes mellitus with other circulatory  complication, without long-term current use of insulin (HCC) Increasing A1c and no longer at goal. Discussed dietary and activity options. Continue and monitor  - POCT glycosylated hemoglobin (Hb A1C)  5. 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   aspirin 81 MG EC tablet Take 1 tablet (81 mg total) by mouth daily. Swallow whole.   carvedilol (  COREG) 3.125 MG tablet Take 1 tablet (3.125 mg total) by mouth 2 (two) times daily with a meal.   nitroGLYCERIN (NITROSTAT) 0.4 MG SL tablet Place 1 tablet (0.4 mg total) under the tongue every 5 (five) minutes x 3 doses as needed for chest pain.   [DISCONTINUED] baclofen (LIORESAL) 10 MG tablet Take 1 tablet (10 mg total) by mouth 2 (two) times daily as needed for muscle spasms.   [DISCONTINUED] furosemide (LASIX) 40 MG tablet Take 1 tablet (40 mg total) by mouth daily.   [DISCONTINUED] rosuvastatin (CRESTOR) 40 MG tablet Take 1 tablet (40 mg total) by mouth daily.   [DISCONTINUED] sacubitril-valsartan (ENTRESTO) 49-51 MG Take 1 tablet by mouth 2 (two) times daily.   [DISCONTINUED] spironolactone (ALDACTONE) 25 MG tablet Take 0.5 tablets (12.5 mg total) by mouth daily.   [DISCONTINUED] ticagrelor (BRILINTA) 90 MG TABS tablet Take 1 tablet (90 mg total) by mouth 2 (two) times daily.   baclofen (LIORESAL) 10 MG tablet Take 1 tablet (10 mg total) by mouth 2 (two) times daily as needed for muscle spasms.   furosemide (LASIX) 40 MG tablet Take 1 tablet (40 mg total) by mouth daily.   rosuvastatin (CRESTOR) 40 MG tablet Take 1 tablet (40 mg total) by mouth daily.   sacubitril-valsartan (ENTRESTO) 49-51 MG Take 1 tablet by mouth 2 (two) times daily.   spironolactone (ALDACTONE) 25 MG  tablet Take 0.5 tablets (12.5 mg total) by mouth daily.   ticagrelor (BRILINTA) 90 MG TABS tablet Take 1 tablet (90 mg total) by mouth 2 (two) times daily.   No facility-administered encounter medications on file as of 04/26/2021.    Follow-up: No follow-ups on file.   Becky Sax, MD

## 2021-05-27 ENCOUNTER — Other Ambulatory Visit: Payer: Self-pay

## 2021-05-27 ENCOUNTER — Telehealth: Payer: Medicaid Other | Admitting: Family Medicine

## 2021-05-27 ENCOUNTER — Telehealth: Payer: Self-pay | Admitting: Family Medicine

## 2021-05-27 NOTE — Telephone Encounter (Signed)
Patient scheduled for OV but did not answer. Message left on vm.  ?

## 2021-05-30 ENCOUNTER — Ambulatory Visit: Payer: MEDICAID | Admitting: Adult Health

## 2021-05-30 ENCOUNTER — Encounter: Payer: Self-pay | Admitting: Adult Health

## 2021-05-30 NOTE — Progress Notes (Unsigned)
[facility] [address] [place] [address] [phone]       STROKE FOLLOW UP NOTE  [name] Date of Birth:  1960/11/05 Medical Record Number:  [id number]   Reason for Referral: stroke follow up    SUBJECTIVE:   CHIEF COMPLAINT:  No chief complaint on file.   HPI:   Update 05/30/2021 JM: 61 year old female who returns for stroke follow-up after prior visit 5 months ago.  Overall stable from stroke standpoint without new stroke/TIA symptoms.  Reports residual left-sided weakness, Medicaid recently approved - PCP placed order for PT but due to location, patient did not pursue therapy.    Compliant on aspirin and Crestor without side effects as well as Brilinta per cardiology recommendations.  Blood pressure today ***.  Does not routinely monitor at home.  Routinely followed by PCP, cardiology and vascular surgery.       History provided for reference purposes only Initial visit 01/07/2021 JM: Presents today for hospital follow-up accompanied by her spouse, Tracey Meyer.  Reports residual left-sided weakness and gait impairment but unfortunately unable to participate in therapies due to lack of insurance (currently Medicaid pending).  She has been trying to do exercises at home.  Over the past few days, feels like left upper arm and left hamstring has been getting tight with increased pain. Use of RW for short distance. Very supportive husband who assists as needed.  Denies new or worsening stroke/TIA symptoms.  Compliant on aspirin and Brilinta as well as Crestor without side effects.  Blood pressure today elevated -she does not routinely monitor home as she states she needs to get batteries (although similar story with cardiology appt 1 month ago).   She was rehospitalized on 11/20/2020 for progressive shortness of breath with CHF exacerbation with elevated BP, CXR bilateral pulmonary edema and BNP 1581.  Treated with IV Lasix and per note,  suspicion of medication non compliance.   Currently being followed by cardiology social work for any needed resources and has appointment in November for [facility] and [place] disability   Stroke admission 09/27/2020 Tracey Meyer is a 61 y.o. female with PMH significant for DM2, tobacco use and trying to quit, HLD, CAD s/p angio and stenting of mLAD d/c'd on 09/24/2020 who presented on 09/27/2020 with L sided weakness that started after recent discharge. She has also been having shortness of breath and it gets worse with lying flat.  Stroke work-up revealed scattered infarcts (involving bilateral cerebral and cerebellar hemispheres with largest infarct right occipital) likely cardioembolic secondary to cardiomyopathy with low EF.  Also evidence of remote lacunar infarcts involving left pons and pons. MRA neck showed left ICA 70% stenosis although CTA head/neck showed left V4 moderate stenosis only - no significant left ICA stenosis.  2D echo 30 to 35% down from 35 to 40% on 7/2.  TEE no evidence of LV or LA thrombus or PFO.  Recommended 30-day cardiac event monitor to rule out atrial fibrillation.  LDL 130.  A1c 6.8.  Resumed home dose aspirin and Brilinta and increase home dose Crestor from 10 mg to 40 mg daily.  Current heavy tobacco use with smoking cessation counseling provided.  Therapy eval's recommended discharge to CIR.      PERTINENT IMAGING  MR BRAIN 09/28/2020 MRA HEAD/NECK IMPRESSION: MRI HEAD: 1. Multiple scattered acute to early subacute ischemic nonhemorrhagic infarcts involving the bilateral cerebral and cerebellar hemispheres as above, with largest area of infarction measuring up to 2 cm at  the right occipital cortex. Given the various vascular distributions involved, a central thromboembolic etiology is suspected. 2. Underlying moderate chronic microvascular ischemic disease with a few scattered remote lacunar infarcts involving the left thalamus and pons.   MRA  HEAD: 1. Technically limited exam due to motion artifact. 2. Negative intracranial MRA for large vessel occlusion. Intracranial atherosclerotic disease with associated moderate stenoses involving the distal left V4 segment, distal basilar artery, and cavernous right ICA as above. 3. 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   MRA NECK: 1. Technically limited exam due to motion artifact. 2. Approximate 70% atheromatous stenosis at the origin of the left ICA. 3. Otherwise negative MRA of the neck. No other hemodynamically significant or correctable stenosis identified.  CTA HEAD/NECK 10/01/2020 IMPRESSION: The aorta has a normal appearance.   Mild atherosclerotic plaque at both carotid bifurcations but without stenosis on either side. No evidence of ulceration or pronounced irregularity.   Mild atherosclerotic change at the carotid siphons but without stenosis.   No intracranial large or medium vessel occlusion in the anterior circulation.   Right vertebral artery either terminates in PICA or is occluded distal to PICA. Left vertebral artery shows a moderate stenosis of the distal V4 segment but does remain patent to the basilar. Mild atherosclerotic irregularity of the proximal basilar artery.        ROS:   14 system review of systems performed and negative with exception of those listed in HPI  PMH:  Past Medical History:  Diagnosis Date   Cancer ([place])    Cardiomyopathy, ischemic 09/24/2020   DM (diabetes mellitus), type 2 ([place]) 09/24/2020   HTN (hypertension)    Hyperlipidemia    Myocardial infarction [facility])    Prediabetes    S/P angioplasty with stent to mLAD 09/22/20  09/24/2020   Stroke ([place])    Tobacco abuse 09/24/2020   Transaminitis 09/24/2020    PSH:  Past Surgical History:  Procedure Laterality Date   BUBBLE STUDY  10/02/2020   Procedure: BUBBLE STUDY;  Surgeon: Lelon Perla, MD;  Location: [facility] ENDOSCOPY;  Service:  Cardiovascular;;   CORONARY/GRAFT ACUTE MI REVASCULARIZATION N/A 09/22/2020   Procedure: Coronary/Graft Acute MI Revascularization;  Surgeon: Martinique, Peter M, MD;  Location: [place] CV LAB;  Service: Cardiovascular;  Laterality: N/A;   LEFT HEART CATH AND CORONARY ANGIOGRAPHY N/A 09/22/2020   Procedure: LEFT HEART CATH AND CORONARY ANGIOGRAPHY;  Surgeon: Martinique, Peter M, MD;  Location: [place] CV LAB;  Service: Cardiovascular;  Laterality: N/A;   mastectomy Left    at age 30 due to concerns of cancer   TEE WITHOUT CARDIOVERSION N/A 10/02/2020   Procedure: TRANSESOPHAGEAL ECHOCARDIOGRAM (TEE);  Surgeon: Lelon Perla, MD;  Location: [facility] ENDOSCOPY;  Service: Cardiovascular;  Laterality: N/A;    Social History:  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    Comment: pending disability as of 11/21/20  Tobacco Use   Smoking status: Some Days    Packs/day: 0.50    Years: 45.00    Pack years: 22.50    Types: Cigarettes    Last attempt to quit: 09/22/2020    Years since quitting: 0.6   Smokeless tobacco: Never   Tobacco comments:  Pt states she puffs on cigarettes but don't inhale  Vaping Use   Vaping Use: Never used  Substance and Sexual Activity   Alcohol use: Not Currently   Drug use: Never   Sexual activity: Not on file  Other Topics Concern   Not on file  Social History Narrative   Not on file   Social Determinants of Health   Financial Resource Strain: High Risk   Difficulty of Paying Living Expenses: Very hard  Food Insecurity: Food Insecurity Present   Worried About Charity fundraiser in the Last Year: Sometimes true   Arboriculturist in the Last Year: Sometimes true  Transportation Needs: Public librarian  (Medical): Yes   Lack of Transportation (Non-Medical): Yes  Physical Activity: Not on file  Stress: Not on file  Social Connections: Not on file  Intimate Partner Violence: Not on file    Family History:  Family History  Problem Relation Age of Onset   Cancer Mother    Lupus Sister    Heart disease Brother    Cancer Maternal Aunt     Medications:   Current Outpatient Medications on File Prior to Visit  Medication Sig Dispense Refill   acetaminophen (TYLENOL) 325 MG tablet Take 2 tablets (650 mg total) by mouth every 4 (four) hours as needed for headache or mild pain.     aspirin 81 MG EC tablet Take 1 tablet (81 mg total) by mouth daily. Swallow whole. 90 tablet 3   baclofen (LIORESAL) 10 MG tablet Take 1 tablet (10 mg total) by mouth 2 (two) times daily as needed for muscle spasms. 30 each 5   carvedilol (COREG) 3.125 MG tablet Take 1 tablet (3.125 mg total) by mouth 2 (two) times daily with a meal. 180 tablet 1   furosemide (LASIX) 40 MG tablet Take 1 tablet (40 mg total) by mouth daily. 90 tablet 0   nitroGLYCERIN (NITROSTAT) 0.4 MG SL tablet Place 1 tablet (0.4 mg total) under the tongue every 5 (five) minutes x 3 doses as needed for chest pain. 25 tablet 0   rosuvastatin (CRESTOR) 40 MG tablet Take 1 tablet (40 mg total) by mouth daily. 90 tablet 0   sacubitril-valsartan (ENTRESTO) 49-51 MG Take 1 tablet by mouth 2 (two) times daily. 60 tablet 2   spironolactone (ALDACTONE) 25 MG tablet Take 0.5 tablets (12.5 mg total) by mouth daily. 45 tablet 0   ticagrelor (BRILINTA) 90 MG TABS tablet Take 1 tablet (90 mg total) by mouth 2 (two) times daily. 180 tablet 0   No current facility-administered medications on file prior to visit.    Allergies:  No Known Allergies    OBJECTIVE:  Physical Exam  There were no vitals filed for this visit.  There is no height or weight on file to calculate BMI. No results found.  General: well developed, well nourished, pleasant middle-age  Caucasian female, seated, in no evident distress Head: head normocephalic and atraumatic.   Neck: supple with no carotid or supraclavicular bruits Cardiovascular: regular rate and rhythm, no murmurs Musculoskeletal: no deformity Skin:  no rash/petichiae Vascular:  Normal pulses all extremities   Neurologic Exam Mental Status: Awake and fully alert.  Mild dysarthria.  Oriented to place and time. Recent and remote memory intact. Attention span, concentration and fund of knowledge appropriate. Mood and affect appropriate.  Cranial Nerves: Pupils equal, briskly reactive to light. Extraocular movements full without nystagmus. Visual fields full to confrontation.  Hearing intact. Facial sensation intact.  Mild left lower facial weakness.  Tongue, palate moves normally and symmetrically.  Motor: Normal strength, bulk and tone right upper and lower extremity.  LUE: 4/5 increased tone greater distally,  LLE: 3/5 HF, 4/5 knee extension, 3/5 knee flexion, and foot drop with AFO brace; increased tone throughout  Sensory.: intact to touch , pinprick , position and vibratory sensation although subjective left upper and lower extremity numbness Coordination: Rapid alternating movements normal on left side. Finger-to-nose and heel-to-shin performed accurately on left side. Gait and Station: deferred to AD not present during visit Reflexes: brisk LUE and LLE extremity, 1+ RUE and RLE. Toes downgoing.         ASSESSMENT: Tracey Meyer is a 61 y.o. year old female with scattered infarcts likely cardioembolic secondary to cardiomyopathy with low EF on 09/27/2020. Vascular risk factors include HTN, HLD, DM, cardiomyopathy, CHF CAD and NSTMI s/p angio and stenting of mLAD 09/22/2020.      PLAN:  B/l cerebral and cerebellar infarcts:  Residual deficit: Left spastic hemiparesis with paresthesias and gait impairment. PCP recently referred to outpatient PT as she now has Medicaid.  Start baclofen with gradual  titration ('5mg'$  first few nights, then can increase to BID dosing, if needed, can further increase to '10mg'$  BID as tolerated).   Cardiac monitor negative for atrial fibrillation Continue aspirin 81 mg daily  and Crestor 40 mg daily for secondary stroke prevention.   Discussed secondary stroke prevention measures and importance of close PCP follow up for aggressive stroke risk factor management including HTN with BP goal<130/90, HLD with LDL goal<70 and DM with A1c<7.  Advised to reschedule follow-up visit with PCP for cholesterol panel (unable to obtain today as not fasting)  NSTEMI s/p stent: Followed by cardiology on aspirin, Brilinta and statin Left carotid Stenosis:  Carotid ultrasound 01/2021 left ICA 40 to 59% stenosis, right ICA 1 to 39% stenosis Followed by vascular surgery, plans on medical management and follow-up in 1 year MRA L ICA 70% stenosis although not evidence of stenosis on CTA. Plans to establish care with vascular surgery     Follow up in 4-5 months or call earlier if needed   CC:  PCP: Dorna Mai, MD    I spent 57 minutes of face-to-face and non-face-to-face time with patient and husband.  This included previsit chart review, lab review, study review, order entry, electronic health record documentation, patient and husband education regarding prior stroke including residual deficits, secondary stroke prevention measures and importance of managing stroke risk factors and answered all other questions to patient satisfaction  Frann Rider, [facility]  [facility] [address] [place], [place] [address]  Phone [phone] Fax [phone] Note: This document was prepared with digital dictation and possible smart phrase technology. Any transcriptional errors that result from this process are unintentional.

## 2021-06-10 ENCOUNTER — Telehealth: Payer: Self-pay | Admitting: *Deleted

## 2021-06-10 NOTE — Telephone Encounter (Signed)
Called place to [facility] about [employer]. Paper were faxed on 04/30/2021. I will resend paper work today. ?

## 2021-07-05 DIAGNOSIS — C801 Malignant (primary) neoplasm, unspecified: Secondary | ICD-10-CM | POA: Insufficient documentation

## 2021-07-05 DIAGNOSIS — R7303 Prediabetes: Secondary | ICD-10-CM | POA: Insufficient documentation

## 2021-07-06 NOTE — Progress Notes (Signed)
?Cardiology Office Note:   ? ?Date:  07/08/2021  ? ?ID:  Tracey Meyer, DOB December 06, 1960, MRN [id number] ? ?PCP:  Dorna Mai, MD  ?Cardiologist:  Shirlee More, MD   ? ?Referring MD: Dorna Mai, MD  ? ? ?ASSESSMENT:   ? ?1. Coronary artery disease of native artery of native heart with stable angina pectoris ([place])   ?2. Ischemic cardiomyopathy   ?3. Essential hypertension   ?4. Hyperlipidemia LDL goal <70   ?5. History of CVA (cerebrovascular accident)   ? ?PLAN:   ? ?In order of problems listed above: ? ?Stable CAD optimize treatment ongoing to change from Brilinta to clopidogrel for dual antiplatelet therapy and continued long-term carotid disease ?Recheck echo continue beta-blocker Entresto spironolactone.  Continue up titration next visit adding SGLT2 inhibitor ?Continue guideline directed treatment. ?Check lipid profile continue high intensity statin screen for LP(a) ?Stable followed by vascular surgery ? ? ? ? ?Next appointment: 3 months ? ? ?Medication Adjustments/Labs and Tests Ordered: ?Current medicines are reviewed at length with the patient today.  Concerns regarding medicines are outlined above.  ?No orders of the defined types were placed in this encounter. ? ?No orders of the defined types were placed in this encounter. ? ?Chief complaint follow-up CAD ? ? ?History of Present Illness:   ? ?Tracey Meyer is a 61 y.o. female with a hx of CAD s/p DES to LAD 09/22/2020 CVA, with left ICA 70% stenosis HTN, HLD, HFrEF/ICM  last seen 01/28/2021.  Her ejection fraction 702 2022 reduced in range of 30 to 40% there is no ventricular thrombus.  She presented initially with STEMI anterior 09/21/2020 ?She is followed by vascular surgery follow-up carotid duplex 02/05/2021 showed right ICA stenosis 1 to 39% left ICA stenosis 40 to 59% less than 50% left common carotid artery stenosis. ? ?Compliance with diet, lifestyle and medications: Yes but she struggles with her medications forgets to take her  second dose ? ?Her husband is present participates in evaluation decision making ?We will start to use a pillbox for compliance with medications ?I think she can transition from twice daily Brilinta to once daily clopidogrel for dual antiplatelet therapy ?We will recheck an echocardiogram but continue her current guideline directed treatment with Entresto and spironolactone and reassess labs including renal function lipids. ?She had 1 episode of chest pain and was taking a single nitroglycerin ?She has improved in her functional ability still cannot ?No edema orthopnea chest pain or syncope ?Past Medical History:  ?Diagnosis Date  ? Cancer [facility])   ? Cardiomyopathy, ischemic 09/24/2020  ? DM (diabetes mellitus), type 2 ([place]) 09/24/2020  ? HTN (hypertension)   ? Hyperlipidemia   ? Myocardial infarction [facility])   ? Prediabetes   ? S/P angioplasty with stent to mLAD 09/22/20  09/24/2020  ? Stroke [facility])   ? Tobacco abuse 09/24/2020  ? Transaminitis 09/24/2020  ? ? ?Past Surgical History:  ?Procedure Laterality Date  ? BUBBLE STUDY  10/02/2020  ? Procedure: BUBBLE STUDY;  Surgeon: Lelon Perla, MD;  Location: [facility] ENDOSCOPY;  Service: Cardiovascular;;  ? CORONARY/GRAFT ACUTE MI REVASCULARIZATION N/A 09/22/2020  ? Procedure: Coronary/Graft Acute MI Revascularization;  Surgeon: Martinique, Peter M, MD;  Location: [place] CV LAB;  Service: Cardiovascular;  Laterality: N/A;  ? LEFT HEART CATH AND CORONARY ANGIOGRAPHY N/A 09/22/2020  ? Procedure: LEFT HEART CATH AND CORONARY ANGIOGRAPHY;  Surgeon: Martinique, Peter M, MD;  Location: [place] CV LAB;  Service: Cardiovascular;  Laterality: N/A;  ? mastectomy  Left   ? at age 49 due to concerns of cancer  ? TEE WITHOUT CARDIOVERSION N/A 10/02/2020  ? Procedure: TRANSESOPHAGEAL ECHOCARDIOGRAM (TEE);  Surgeon: Lelon Perla, MD;  Location: [facility] ENDOSCOPY;  Service: Cardiovascular;  Laterality: N/A;  ? ? ?Current Medications: ?Current Meds  ?Medication Sig  ? acetaminophen (TYLENOL)  325 MG tablet Take 2 tablets (650 mg total) by mouth every 4 (four) hours as needed for headache or mild pain.  ? aspirin 81 MG EC tablet Take 1 tablet (81 mg total) by mouth daily. Swallow whole.  ? carvedilol (COREG) 3.125 MG tablet Take 1 tablet (3.125 mg total) by mouth 2 (two) times daily with a meal. (Patient taking differently: Take 3.125 mg by mouth daily.)  ? furosemide (LASIX) 40 MG tablet Take 1 tablet (40 mg total) by mouth daily.  ? nitroGLYCERIN (NITROSTAT) 0.4 MG SL tablet Place 1 tablet (0.4 mg total) under the tongue every 5 (five) minutes x 3 doses as needed for chest pain.  ? rosuvastatin (CRESTOR) 40 MG tablet Take 1 tablet (40 mg total) by mouth daily.  ? ticagrelor (BRILINTA) 90 MG TABS tablet Take 1 tablet (90 mg total) by mouth 2 (two) times daily. (Patient taking differently: Take 90 mg by mouth daily.)  ?  ? ?Allergies:   Patient has no known allergies.  ? ?Social History  ? ?Socioeconomic History  ? Marital status: Single  ?  Spouse name: Not on file  ? Number of children: 0  ? Years of education: Not on file  ? Highest education level: High school graduate  ?Occupational History  ? 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  Comment: pending disability as of 11/21/20  ?Tobacco Use  ? Smoking status: Some Days  ?  Packs/day: 0.50  ?  Years: 45.00  ?  Pack years: 22.50  ?  Types: Cigarettes  ?  Last attempt to quit: 09/22/2020  ?  Years since quitting: 0.7  ?  Passive exposure: Past  ? Smokeless tobacco: Never  ? Tobacco comments:  ?  Pt states she puffs on cigarettes but don't inhale  ?Vaping Use  ? Vaping Use: Never used  ?Substance and Sexual Activity  ? Alcohol use: Not Currently  ? Drug use: Never  ? Sexual activity: Not on file  ?Other Topics Concern  ? Not on file  ?Social History Narrative  ? Not on file  ? ?Social Determinants of Health  ? ?Financial Resource Strain: High Risk  ? Difficulty of Paying Living Expenses: Very hard  ?Food Insecurity: Food Insecurity Present  ? Worried About  Charity fundraiser in the Last Year: Sometimes true  ? Ran Out of Food in the Last Year: Sometimes true  ?Transportation Needs: Unmet Transportation Needs  ? Lack of Transportation (Medical): Yes  ? Lack of Transportation (Non-Medical): Yes  ?Physical Activity: Not on file  ?Stress: Not on file  ?Social Connections: Not on file  ?  ? ?Family History: ?The patient's  ?family history includes Cancer in her maternal aunt and mother; Heart disease in her brother; Lupus in her sister. ?ROS:   ?Please see the history of present illness.    ?All other systems reviewed and are negative. ? ?EKGs/Labs/Other Studies Reviewed:   ? ?The following studies were reviewed today: ?Long term monitor 12/31/20 ?Normal sinus rhythm ?Rare PACs with 8 brief runs of PAT. longest 12 beats ?One 4 beat run NSVT ?Symptoms on one occasion during sinus tachycardia ?  ?Echo 09/22/20: ?  1. Apex well-visualized without contrast - false tendon (normal variant)  ?- no thrombus. Left ventricular ejection fraction, by estimation, is 35 to  ?40%. The left ventricle has moderately decreased function. The left  ?ventricle demonstrates regional wall  ?motion abnormalities (see scoring diagram/findings for description). There  ?is mild left ventricular hypertrophy. Left ventricular diastolic  ?parameters are consistent with Grade I diastolic dysfunction (impaired  ?relaxation). Elevated left ventricular  ?end-diastolic pressure. There is severe hypokinesis of the left  ?ventricular, entire anterior wall, anteroseptal wall, apical segment and  ?inferoapical segment. Findings suggest LAD territory ischemia/infarct.  ? 2. Right ventricular systolic function is normal. The right ventricular  ?size is normal. There is normal pulmonary artery systolic pressure.  ? 3. The mitral valve is abnormal. Mild mitral valve regurgitation.  ? 4. The aortic valve is tricuspid. Aortic valve regurgitation is not  ?visualized.  ? 5. The inferior vena cava is normal in size with  <50% respiratory  ?variability, suggesting right atrial pressure of 8 mmHg. ?  ?[place] 09/22/20: ?RPDA lesion is 50% stenosed. ?Dist Cx lesion is 30% stenosed with 30% stenosed side branch in LPAV. ?1st Diag lesion

## 2021-07-08 ENCOUNTER — Encounter: Payer: Self-pay | Admitting: Cardiology

## 2021-07-08 ENCOUNTER — Ambulatory Visit (INDEPENDENT_AMBULATORY_CARE_PROVIDER_SITE_OTHER): Payer: Medicaid Other | Admitting: Cardiology

## 2021-07-08 VITALS — BP 128/64 | HR 78 | Ht 64.0 in | Wt 150.0 lb

## 2021-07-08 DIAGNOSIS — I25118 Atherosclerotic heart disease of native coronary artery with other forms of angina pectoris: Secondary | ICD-10-CM

## 2021-07-08 DIAGNOSIS — I255 Ischemic cardiomyopathy: Secondary | ICD-10-CM | POA: Diagnosis not present

## 2021-07-08 DIAGNOSIS — E785 Hyperlipidemia, unspecified: Secondary | ICD-10-CM | POA: Diagnosis not present

## 2021-07-08 DIAGNOSIS — Z8673 Personal history of transient ischemic attack (TIA), and cerebral infarction without residual deficits: Secondary | ICD-10-CM

## 2021-07-08 DIAGNOSIS — I10 Essential (primary) hypertension: Secondary | ICD-10-CM | POA: Diagnosis not present

## 2021-07-08 MED ORDER — CLOPIDOGREL BISULFATE 75 MG PO TABS: 75.0000 mg | ORAL_TABLET | Freq: Every day | ORAL | 3 refills | Status: DC

## 2021-07-08 MED ORDER — ENTRESTO 49-51 MG PO TABS: 1.0000 | ORAL_TABLET | Freq: Two times a day (BID) | ORAL | 5 refills | Status: DC

## 2021-07-08 MED ORDER — SPIRONOLACTONE 25 MG PO TABS: 12.5000 mg | ORAL_TABLET | Freq: Every day | ORAL | 5 refills | Status: DC

## 2021-07-08 NOTE — Addendum Note (Signed)
Addended by: Jerl Santos R on: 07/08/2021 12:03 PM ? ? Modules accepted: Orders ? ?

## 2021-07-08 NOTE — Patient Instructions (Signed)
Medication Instructions:  ?Your physician has recommended you make the following change in your medication:  ?Discontinue Brillinta ?Start Plavix 75 MG once daily ?Continue Entresto and Spironolactone ? ?*If you need a refill on your cardiac medications before your next appointment, please call your pharmacy* ? ? ?Lab Work: ?Your physician recommends that you return for lab work in: Today for [place], Lipid panel and LPa ? ?If you have labs (blood work) drawn today and your tests are completely normal, you will receive your results only by: ?MyChart Message (if you have MyChart) OR ?A paper copy in the mail ?If you have any lab test that is abnormal or we need to change your treatment, we will call you to review the results. ? ? ?Testing/Procedures: ?Your physician has requested that you have an echocardiogram. Echocardiography is a painless test that uses sound waves to create images of your heart. It provides your doctor with information about the size and shape of your heart and how well your heart?s chambers and valves are working. This procedure takes approximately one hour. There are no restrictions for this procedure.  ? ? ?Follow-Up: ?At [facility], you and your health needs are our priority.  As part of our continuing mission to provide you with exceptional heart care, we have created designated Provider Care Teams.  These Care Teams include your primary Cardiologist (physician) and Advanced Practice Providers (APPs -  Physician Assistants and Nurse Practitioners) who all work together to provide you with the care you need, when you need it. ? ?We recommend signing up for the patient portal called "MyChart".  Sign up information is provided on this After Visit Summary.  MyChart is used to connect with patients for Virtual Visits (Telemedicine).  Patients are able to view lab/test results, encounter notes, upcoming appointments, etc.  Non-urgent messages can be sent to your provider as well.   ?To learn more  about what you can do with MyChart, go to [email or web address].   ? ?Your next appointment:   ?3 month(s) ? ?The format for your next appointment:   ?In Person ? ?Provider:   ?Shirlee More, MD  ? ? ?Other Instructions ? ? ?Important Information About Sugar ? ? ? ? ?  ?

## 2021-07-09 ENCOUNTER — Telehealth: Payer: Self-pay

## 2021-07-09 LAB — COMPREHENSIVE METABOLIC PANEL
ALT: 21 IU/L (ref 0–32)
AST: 24 IU/L (ref 0–40)
Albumin/Globulin Ratio: 1.4 (ref 1.2–2.2)
Albumin: 4.2 g/dL (ref 3.8–4.9)
Alkaline Phosphatase: 78 IU/L (ref 44–121)
BUN/Creatinine Ratio: 22 (ref 12–28)
BUN: 25 mg/dL (ref 8–27)
Bilirubin Total: 0.4 mg/dL (ref 0.0–1.2)
CO2: 21 mmol/L (ref 20–29)
Calcium: 9.8 mg/dL (ref 8.7–10.3)
Chloride: 103 mmol/L (ref 96–106)
Creatinine, Ser: 1.14 mg/dL — ABNORMAL HIGH (ref 0.57–1.00)
Globulin, Total: 3.1 g/dL (ref 1.5–4.5)
Glucose: 214 mg/dL — ABNORMAL HIGH (ref 70–99)
Potassium: 4 mmol/L (ref 3.5–5.2)
Sodium: 141 mmol/L (ref 134–144)
Total Protein: 7.3 g/dL (ref 6.0–8.5)
eGFR: 55 mL/min/{1.73_m2} — ABNORMAL LOW (ref >59)

## 2021-07-09 LAB — LIPID PANEL
Chol/HDL Ratio: 2.4 ratio (ref 0.0–4.4)
Cholesterol, Total: 149 mg/dL (ref 100–199)
HDL: 63 mg/dL (ref >39)
LDL Chol Calc (NIH): 73 mg/dL (ref 0–99)
Triglycerides: 67 mg/dL (ref 0–149)
VLDL Cholesterol Cal: 13 mg/dL (ref 5–40)

## 2021-07-09 LAB — LIPOPROTEIN A (LPA): Lipoprotein (a): 67.7 nmol/L (ref <75.0)

## 2021-07-09 NOTE — Telephone Encounter (Signed)
PA started on CMM for Entresto 49-51 mg. Key [id number] ?

## 2021-07-11 ENCOUNTER — Telehealth: Payer: Self-pay

## 2021-07-11 NOTE — Telephone Encounter (Signed)
Unable to reach the patient, letter mailed requesting call back  ?

## 2021-07-11 NOTE — Telephone Encounter (Signed)
-----   Message from Richardo Priest, MD sent at 07/09/2021  7:47 AM EDT ----- ?I would like to see her add Zetia 10 mg daily to her statin she is in a high risk group with both stroke and CAD and follow-up lipid profile in 6 weeks ?

## 2021-07-17 ENCOUNTER — Ambulatory Visit: Payer: Medicaid Other

## 2021-07-18 NOTE — Telephone Encounter (Signed)
PA denied on CMM for Entresto 49-51 mg. ?

## 2021-07-25 ENCOUNTER — Ambulatory Visit (INDEPENDENT_AMBULATORY_CARE_PROVIDER_SITE_OTHER): Payer: Medicaid Other

## 2021-07-25 DIAGNOSIS — E785 Hyperlipidemia, unspecified: Secondary | ICD-10-CM | POA: Diagnosis not present

## 2021-07-25 DIAGNOSIS — I25118 Atherosclerotic heart disease of native coronary artery with other forms of angina pectoris: Secondary | ICD-10-CM | POA: Diagnosis not present

## 2021-07-25 DIAGNOSIS — Z8673 Personal history of transient ischemic attack (TIA), and cerebral infarction without residual deficits: Secondary | ICD-10-CM

## 2021-07-25 DIAGNOSIS — I255 Ischemic cardiomyopathy: Secondary | ICD-10-CM

## 2021-07-25 DIAGNOSIS — I10 Essential (primary) hypertension: Secondary | ICD-10-CM | POA: Diagnosis not present

## 2021-07-25 LAB — ECHOCARDIOGRAM COMPLETE
Area-P 1/2: 4.36 cm2
MV M vel: 4.09 m/s
MV Peak grad: 66.9 mmHg
Radius: 0.4 cm
S' Lateral: 4.2 cm

## 2021-08-08 ENCOUNTER — Other Ambulatory Visit: Payer: Self-pay

## 2021-08-08 DIAGNOSIS — I25118 Atherosclerotic heart disease of native coronary artery with other forms of angina pectoris: Secondary | ICD-10-CM

## 2021-08-08 MED ORDER — DAPAGLIFLOZIN PROPANEDIOL 10 MG PO TABS: 10.0000 mg | ORAL_TABLET | Freq: Every day | ORAL | 3 refills | Status: DC

## 2021-08-09 ENCOUNTER — Telehealth: Payer: Self-pay

## 2021-08-09 NOTE — Telephone Encounter (Signed)
Prior authorization started with [facility] for Farxiga Key: [id number]

## 2021-08-09 NOTE — Telephone Encounter (Signed)
Case # [id number]  Medication service request approved for Farxiga tablets 10 mg through 08/10/2022

## 2021-09-17 ENCOUNTER — Institutional Professional Consult (permissible substitution): Payer: Medicaid Other | Admitting: Cardiology

## 2021-10-01 DIAGNOSIS — I639 Cerebral infarction, unspecified: Secondary | ICD-10-CM | POA: Insufficient documentation

## 2021-10-01 DIAGNOSIS — I219 Acute myocardial infarction, unspecified: Secondary | ICD-10-CM | POA: Insufficient documentation

## 2021-10-01 NOTE — Progress Notes (Deleted)
Cardiology Office Note:    Date:  10/02/2021   ID:  Tracey Meyer, DOB 04/03/1960, MRN [id number]  PCP:  Dorna Mai, MD  Cardiologist:  Shirlee More, MD    Referring MD: Dorna Mai, MD    ASSESSMENT:    1. Coronary artery disease of native artery of native heart with stable angina pectoris ([place])   2. Hyperlipidemia LDL goal <70   3. Elevated lipoprotein(a)   4. Hypertensive heart disease with heart failure ([place])   5. Ischemic cardiomyopathy   6. Stage 3a chronic kidney disease (HCC)    PLAN:    In order of problems listed above:  ***   Next appointment: ***   Medication Adjustments/Labs and Tests Ordered: Current medicines are reviewed at length with the patient today.  Concerns regarding medicines are outlined above.  No orders of the defined types were placed in this encounter.  No orders of the defined types were placed in this encounter.   No chief complaint on file.   History of Present Illness:    Tracey Meyer is a 61 y.o. female with a hx of CAD s/p DES to LAD 09/22/2020 CVA, with left ICA 70% stenosis HTN, HLD, HFrEF/ICM and is followed by vascular surgery for carotid stenosis last seen 07/08/2021.  Her LP(a) level was elevated on a statin. Compliance with diet, lifestyle and medications: ***  She had an echocardiogram performed 07/25/2021 after her office visit. Left ventricle showed a reduced ejection fraction severe 30 to 35% with akinetic anterior septal region.  Normal right ventricular size and function mild left atrial enlargement moderate mitral regurgitation.  Diastolic dysfunction was present with elevated left atrial pressure.   1. Akinetic anteroseptum. Left ventricular ejection fraction, by  estimation, is 30 to 35%. The left ventricle has moderately decreased  function. The left ventricle demonstrates regional wall motion  abnormalities.   2. Right ventricular systolic function is normal. The right ventricular  size is  normal. There is normal pulmonary artery systolic pressure.   3. Left atrial size was mildly dilated.   4. The mitral valve is normal in structure. Moderate mitral valve  regurgitation. No evidence of mitral stenosis.   5. The aortic valve is normal in structure. Aortic valve regurgitation is  not visualized. Aortic valve sclerosis/calcification is present, without  any evidence of aortic stenosis.   6. The inferior vena cava is normal in size with greater than 50%  respiratory variability, suggesting right atrial pressure of 3 mmHg.  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   Cancer ([place])    Cardiomyopathy, ischemic 09/24/2020   Chronic combined systolic and diastolic congestive heart failure (HCC)    CKD (chronic kidney disease), stage III ([place]) 09/29/2020   Cortical senile cataract of right eye 07/31/2015   DM (diabetes mellitus), type 2 ([place]) 63/84/6659   Embolic stroke ([place]) 9/35/7017   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, 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   Heart failure ([place]) 11/20/2020   HFrEF (heart failure with reduced ejection fraction) ([place]) 09/27/2020   HTN (hypertension)    Hyperlipidemia    Hypertensive urgency 11/21/2020   Hypokalemia    Left-sided weakness  Myocardial infarction [facility]    Nuclear sclerotic cataract of both eyes 07/31/2015   Posterior vitreous detachment of right eye 07/31/2015   Prediabetes    S/P angioplasty with stent to mLAD 09/22/20  09/24/2020   ST elevation myocardial infarction involving left anterior descending (LAD) coronary artery ([place]) 09/22/2020   STEMI involving left anterior descending coronary artery ([place]) 09/22/2020   Stroke  ([place])    Tobacco abuse 09/24/2020   Transaminitis 09/24/2020    Past Surgical History:  Procedure Laterality Date   BUBBLE STUDY  10/02/2020   Procedure: BUBBLE STUDY;  Surgeon: Lelon Perla, MD;  Location: [facility] ENDOSCOPY;  Service: Cardiovascular;;   CORONARY/GRAFT ACUTE MI REVASCULARIZATION N/A 09/22/2020   Procedure: Coronary/Graft Acute MI Revascularization;  Surgeon: Martinique, Peter M, MD;  Location: [place] CV LAB;  Service: Cardiovascular;  Laterality: N/A;   LEFT HEART CATH AND CORONARY ANGIOGRAPHY N/A 09/22/2020   Procedure: LEFT HEART CATH AND CORONARY ANGIOGRAPHY;  Surgeon: Martinique, Peter M, MD;  Location: [place] CV LAB;  Service: Cardiovascular;  Laterality: N/A;   mastectomy Left    at age 62 due to concerns of cancer   TEE WITHOUT CARDIOVERSION N/A 10/02/2020   Procedure: TRANSESOPHAGEAL ECHOCARDIOGRAM (TEE);  Surgeon: Lelon Perla, MD;  Location: [facility] ENDOSCOPY;  Service: Cardiovascular;  Laterality: N/A;    Current Medications: No outpatient medications have been marked as taking for the 10/02/21 encounter (Appointment) with Tracey Priest, MD.     Allergies:   Patient has no known allergies.   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    Comment: pending disability as of 11/21/20  Tobacco Use   Smoking status: Some Days    Packs/day: 0.50    Years: 45.00    Total pack years: 22.50    Types: Cigarettes    Last attempt to quit: 09/22/2020    Years since quitting: 1.0    Passive exposure: Past   Smokeless tobacco: Never   Tobacco comments:    Pt states she puffs on cigarettes but don't inhale  Vaping Use   Vaping Use: Never used  Substance and Sexual Activity   Alcohol use: Not Currently   Drug use: Never   Sexual activity: Not on  file  Other Topics Concern   Not on file  Social History Narrative   Not on file   Social Determinants of Health   Financial Resource Strain: High Risk (11/30/2020)   Overall Financial Resource Strain (CARDIA)    Difficulty of Paying Living Expenses: Very hard  Food Insecurity: Food Insecurity Present (11/30/2020)   Hunger Vital Sign    Worried About Running Out of Food in the Last Year: Sometimes true    Ran Out of Food in the Last Year: Sometimes true  Transportation Needs: Unmet Transportation Needs (11/30/2020)   PRAPARE - Hydrologist (Medical): Yes    Lack of Transportation (Non-Medical): Yes  Physical Activity: Not on file  Stress: Not on file  Social Connections: Not on file     Family History: The patient's ***family history includes Cancer in her maternal aunt and mother; Heart disease in her brother; Lupus in her sister. ROS:   Please see the history of present illness.  All other systems reviewed and are negative.  EKGs/Labs/Other Studies Reviewed:    The following studies were reviewed today:  EKG:  EKG ordered today and personally reviewed.  The ekg ordered today demonstrates ***  Recent Labs: 11/20/2020: B Natriuretic Peptide 1,581.0; Magnesium 2.1; TSH 0.978 11/25/2020: Hemoglobin 10.3; Platelets 427 07/08/2021: ALT 21; BUN 25; Creatinine, Ser 1.14; Potassium 4.0; Sodium 141  Recent Lipid Panel    Component Value Date/Time   CHOL 149 07/08/2021 1213   TRIG 67 07/08/2021 1213   HDL 63 07/08/2021 1213   CHOLHDL 2.4 07/08/2021 1213   CHOLHDL 5.2 09/28/2020 0404   VLDL 20 09/28/2020 0404   LDLCALC 73 07/08/2021 1213    Physical Exam:    VS:  There were no vitals taken for this visit.    Wt Readings from Last 3 Encounters:  07/08/21 150 lb (68 kg)  04/26/21 146 lb 9.6 oz (66.5 kg)  02/05/21 145 lb (65.8 kg)     GEN: *** Well nourished, well developed in no acute distress HEENT: Normal NECK: No JVD; No carotid  bruits LYMPHATICS: No lymphadenopathy CARDIAC: ***RRR, no murmurs, rubs, gallops RESPIRATORY:  Clear to auscultation without rales, wheezing or rhonchi  ABDOMEN: Soft, non-tender, non-distended MUSCULOSKELETAL:  No edema; No deformity  SKIN: Warm and dry NEUROLOGIC:  Alert and oriented x 3 PSYCHIATRIC:  Normal affect    Signed, Shirlee More, MD  10/02/2021 7:37 AM    [facility]

## 2021-10-02 ENCOUNTER — Ambulatory Visit: Payer: Medicaid Other | Admitting: Cardiology

## 2021-10-02 DIAGNOSIS — E7841 Elevated Lipoprotein(a): Secondary | ICD-10-CM

## 2021-10-24 ENCOUNTER — Ambulatory Visit: Payer: Medicaid Other | Admitting: Cardiology

## 2021-12-02 ENCOUNTER — Ambulatory Visit: Payer: Medicaid Other | Attending: Cardiology | Admitting: Cardiology

## 2021-12-02 NOTE — Progress Notes (Deleted)
Electrophysiology Office Note   Date:  12/02/2021   ID:  Tracey Meyer, DOB Aug 20, 1960, MRN [id number]  PCP:  Dorna Mai, MD  Cardiologist:  Bettina Gavia Primary Electrophysiologist:  Darcee Dekker Meredith Leeds, MD    Chief Complaint: CHF   History of Present Illness: Tracey Meyer is a 61 y.o. female who is being seen today for the evaluation of CHF at the request of Bettina Gavia, Hilton Cork, MD. Presenting today for electrophysiology evaluation.  She has a history significant for coronary artery disease status post stent to the LAD in 2022, CVA, left-sided ICA stenosis 70%, hypertension, hyperlipidemia, CHF due to ischemic cardiomyopathy.  She initially presented with STEMI 09/21/2020.  Today, she denies*** symptoms of palpitations, chest pain, shortness of breath, orthopnea, PND, lower extremity edema, claudication, dizziness, presyncope, syncope, bleeding, or neurologic sequela. The patient is tolerating medications without difficulties.    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   Cancer ([place])    Cardiomyopathy, ischemic 09/24/2020   Chronic combined systolic and diastolic congestive heart failure (HCC)    CKD (chronic kidney disease), stage III ([place]) 09/29/2020   Cortical senile cataract of right eye 07/31/2015   DM (diabetes mellitus), type 2 ([place]) 08/65/7846   Embolic stroke ([place]) 9/62/9528   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, 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   Heart failure ([place]) 11/20/2020   HFrEF (heart failure with reduced ejection fraction) ([place]) 09/27/2020   HTN (hypertension)    Hyperlipidemia    Hypertensive urgency 11/21/2020   Hypokalemia     Left-sided weakness    Myocardial infarction [facility])    Nuclear sclerotic cataract of both eyes 07/31/2015   Posterior vitreous detachment of right eye 07/31/2015   Prediabetes    S/P angioplasty with stent to mLAD 09/22/20  09/24/2020   ST elevation myocardial infarction involving left anterior descending (LAD) coronary artery ([place]) 09/22/2020   STEMI involving left anterior descending coronary artery ([place]) 09/22/2020   Stroke ([place])    Tobacco abuse 09/24/2020   Transaminitis 09/24/2020   Past Surgical History:  Procedure Laterality Date   BUBBLE STUDY  10/02/2020   Procedure: BUBBLE STUDY;  Surgeon: Lelon Perla, MD;  Location: [place];  Service: Cardiovascular;;   CORONARY/GRAFT ACUTE MI REVASCULARIZATION N/A 09/22/2020   Procedure: Coronary/Graft Acute MI Revascularization;  Surgeon: Martinique, Peter M, MD;  Location: [place] CV LAB;  Service: Cardiovascular;  Laterality: N/A;   LEFT HEART CATH AND CORONARY ANGIOGRAPHY N/A 09/22/2020   Procedure: LEFT HEART CATH AND CORONARY ANGIOGRAPHY;  Surgeon: Martinique, Peter M, MD;  Location: [place] CV [facility];  Service: Cardiovascular;  Laterality: N/A;   mastectomy Left    at age 1 due to concerns of cancer   TEE WITHOUT CARDIOVERSION N/A 10/02/2020   Procedure: TRANSESOPHAGEAL ECHOCARDIOGRAM (TEE);  Surgeon: Lelon Perla, MD;  Location: [facility] ENDOSCOPY;  Service: Cardiovascular;  Laterality: N/A;     Current Outpatient Medications  Medication Sig Dispense Refill   acetaminophen (TYLENOL) 325 MG tablet Take 2 tablets (650 mg total) by mouth every 4 (four) hours as needed for headache or mild pain.     aspirin 81 MG EC tablet Take 1 tablet (  81 mg total) by mouth daily. Swallow whole. 90 tablet 3   baclofen (LIORESAL) 10 MG tablet Take 1 tablet (10 mg total) by mouth 2 (two) times daily as needed for muscle spasms. (Patient not taking: Reported on 07/08/2021) 30 each 5   carvedilol (COREG) 3.125 MG tablet Take 1 tablet (3.125 mg total) by  mouth 2 (two) times daily with a meal. (Patient taking differently: Take 3.125 mg by mouth daily.) 180 tablet 1   clopidogrel (PLAVIX) 75 MG tablet Take 1 tablet (75 mg total) by mouth daily. 90 tablet 3   dapagliflozin propanediol (FARXIGA) 10 MG TABS tablet Take 1 tablet (10 mg total) by mouth daily. 90 tablet 3   furosemide (LASIX) 40 MG tablet Take 1 tablet (40 mg total) by mouth daily. 90 tablet 0   nitroGLYCERIN (NITROSTAT) 0.4 MG SL tablet Place 1 tablet (0.4 mg total) under the tongue every 5 (five) minutes x 3 doses as needed for chest pain. 25 tablet 0   rosuvastatin (CRESTOR) 40 MG tablet Take 1 tablet (40 mg total) by mouth daily. 90 tablet 0   sacubitril-valsartan (ENTRESTO) 49-51 MG Take 1 tablet by mouth 2 (two) times daily. 60 tablet 5   spironolactone (ALDACTONE) 25 MG tablet Take 0.5 tablets (12.5 mg total) by mouth daily. 45 tablet 5   No current facility-administered medications for this visit.    Allergies:   Patient has no known allergies.   Social History:  The patient  reports that she has been smoking cigarettes. She has a 22.50 pack-year smoking history. She has been exposed to tobacco smoke. She has never used smokeless tobacco. She reports that she does not currently use alcohol. She reports that she does not use drugs.   Family History:  The patient's family history includes Cancer in her maternal aunt and mother; Heart disease in her brother; Lupus in her sister.    ROS:  Please see the history of present illness.   Otherwise, review of systems is positive for none.   All other systems are reviewed and negative.    PHYSICAL EXAM: VS:  There were no vitals taken for this visit. , BMI There is no height or weight on file to calculate BMI. GEN: Well nourished, well developed, in no acute distress  HEENT: normal  Neck: no JVD, carotid bruits, or masses Cardiac: ***RRR; no murmurs, rubs, or gallops,no edema  Respiratory:  clear to auscultation bilaterally, normal  work of breathing GI: soft, nontender, nondistended, + BS MS: no deformity or atrophy  Skin: warm and dry Neuro:  Strength and sensation are intact Psych: euthymic mood, full affect  EKG:  EKG {ACTION; IS/IS [id number]} ordered today. Personal review of the ekg ordered *** shows ***  Recent Labs: 07/08/2021: ALT 21; BUN 25; Creatinine, Ser 1.14; Potassium 4.0; Sodium 141    Lipid Panel     Component Value Date/Time   CHOL 149 07/08/2021 1213   TRIG 67 07/08/2021 1213   HDL 63 07/08/2021 1213   CHOLHDL 2.4 07/08/2021 1213   CHOLHDL 5.2 09/28/2020 0404   VLDL 20 09/28/2020 0404   LDLCALC 73 07/08/2021 1213     Wt Readings from Last 3 Encounters:  07/08/21 150 lb (68 kg)  04/26/21 146 lb 9.6 oz (66.5 kg)  02/05/21 145 lb (65.8 kg)      Other studies Reviewed: Additional studies/ records that were reviewed today include: TTE 07/25/21  Review of the above records today demonstrates:   1. Akinetic anteroseptum. Left  ventricular ejection fraction, by  estimation, is 30 to 35%. The left ventricle has moderately decreased  function. The left ventricle demonstrates regional wall motion  abnormalities.   2. Right ventricular systolic function is normal. The right ventricular  size is normal. There is normal pulmonary artery systolic pressure.   3. Left atrial size was mildly dilated.   4. The mitral valve is normal in structure. Moderate mitral valve  regurgitation. No evidence of mitral stenosis.   5. The aortic valve is normal in structure. Aortic valve regurgitation is  not visualized. Aortic valve sclerosis/calcification is present, without  any evidence of aortic stenosis.   6. The inferior vena cava is normal in size with greater than 50%  respiratory variability, suggesting right atrial pressure of 3 mmHg.    ASSESSMENT AND PLAN:  1.  Chronic systolic heart failure: Due to ischemic cardiomyopathy.  Currently on Entresto 49/51 mg twice daily, Aldactone 12.5 mg daily,  Farxiga 10 mg daily, carvedilol 3.125 mg twice daily.  Despite optimal medical therapy, her ejection fraction remains reduced.  She would thus benefit from ICD implant.  Risk and benefits were discussed which include bleeding, tamponade, infection, pneumothorax, lead dislodgment, inappropriate shocks.  She understands these risks and is agreed to the procedure.  2.  Coronary artery disease: No current chest pain.  Continue plan per primary cardiology.  3.  Hypertension:***  4.  Hyperlipidemia: Continue statin per primary cardiology  Current medicines are reviewed at length with the patient today.   The patient {ACTIONS; HAS/DOES NOT HAVE:19233} concerns regarding her medicines.  The following changes were made today:  {NONE DEFAULTED:18576}  Labs/ tests ordered today include: *** No orders of the defined types were placed in this encounter.    Disposition:   FU with Aala Ransom {gen number [id number]} {Days to years:10300}  Signed, Ransom Nickson Meredith Leeds, MD  12/02/2021 10:19 AM     [facility] [address] [place] [address] [phone] (office) [phone] (fax)

## 2021-12-06 ENCOUNTER — Telehealth: Payer: Self-pay

## 2021-12-06 ENCOUNTER — Other Ambulatory Visit: Payer: Self-pay

## 2021-12-06 ENCOUNTER — Telehealth: Payer: Self-pay | Admitting: Cardiology

## 2021-12-06 DIAGNOSIS — I25118 Atherosclerotic heart disease of native coronary artery with other forms of angina pectoris: Secondary | ICD-10-CM

## 2021-12-06 DIAGNOSIS — I255 Ischemic cardiomyopathy: Secondary | ICD-10-CM

## 2021-12-06 MED ORDER — ROSUVASTATIN CALCIUM 40 MG PO TABS: 40.0000 mg | ORAL_TABLET | Freq: Every day | ORAL | 3 refills | Status: DC

## 2021-12-06 MED ORDER — FUROSEMIDE 40 MG PO TABS: 40.0000 mg | ORAL_TABLET | Freq: Every day | ORAL | 3 refills | Status: DC

## 2021-12-06 MED ORDER — CARVEDILOL 3.125 MG PO TABS: 3.1250 mg | ORAL_TABLET | Freq: Two times a day (BID) | ORAL | 3 refills | Status: DC

## 2021-12-06 MED ORDER — DAPAGLIFLOZIN PROPANEDIOL 10 MG PO TABS: 10.0000 mg | ORAL_TABLET | Freq: Every day | ORAL | 3 refills | Status: DC

## 2021-12-06 MED ORDER — ENTRESTO 49-51 MG PO TABS: 1.0000 | ORAL_TABLET | Freq: Two times a day (BID) | ORAL | 5 refills | Status: DC

## 2021-12-06 MED ORDER — CLOPIDOGREL BISULFATE 75 MG PO TABS: 75.0000 mg | ORAL_TABLET | Freq: Every day | ORAL | 3 refills | Status: DC

## 2021-12-06 MED ORDER — SPIRONOLACTONE 25 MG PO TABS: 12.5000 mg | ORAL_TABLET | Freq: Every day | ORAL | 5 refills | Status: DC

## 2021-12-06 NOTE — Telephone Encounter (Signed)
Patient called the office and reported that she was very short of breath and coughing all last night and unable to get much sleep.She also reported that she was coughing up mucus. Due to her being very short of breath on the phone and after consulting with Dr. Bettina Gavia, he recommended that the patient should go to the ER. I relayed this information to the patient and she agreed. Patient had no further questions at this time.

## 2021-12-06 NOTE — Telephone Encounter (Signed)
New message   Pt c/o Shortness Of Breath: STAT if SOB developed within the last 24 hours or pt is noticeably SOB on the phone  1. Are you currently SOB (can you hear that pt is SOB on the phone)? yes  2. How long have you been experiencing SOB? Couple days  3. Are you SOB when sitting or when up moving around? both  4. Are you currently experiencing any other symptoms?   Pt said she is currently out of her meds

## 2021-12-07 DIAGNOSIS — I361 Nonrheumatic tricuspid (valve) insufficiency: Secondary | ICD-10-CM

## 2021-12-08 DIAGNOSIS — N179 Acute kidney failure, unspecified: Secondary | ICD-10-CM

## 2021-12-08 DIAGNOSIS — I2510 Atherosclerotic heart disease of native coronary artery without angina pectoris: Secondary | ICD-10-CM

## 2021-12-08 DIAGNOSIS — I509 Heart failure, unspecified: Secondary | ICD-10-CM

## 2021-12-09 ENCOUNTER — Telehealth: Payer: Self-pay

## 2021-12-09 DIAGNOSIS — I2510 Atherosclerotic heart disease of native coronary artery without angina pectoris: Secondary | ICD-10-CM | POA: Diagnosis not present

## 2021-12-09 DIAGNOSIS — N179 Acute kidney failure, unspecified: Secondary | ICD-10-CM | POA: Diagnosis not present

## 2021-12-09 DIAGNOSIS — I509 Heart failure, unspecified: Secondary | ICD-10-CM | POA: Diagnosis not present

## 2021-12-09 NOTE — Telephone Encounter (Signed)
PA started in [facility] for Entresto 49-51 mg  Bobie Caris Key: [id number] - Rx #: [phone]

## 2021-12-09 NOTE — Telephone Encounter (Signed)
Denied  Request Reference Number: [id number]. ENTRESTO TAB 49-'51MG'$  is denied for not meeting the prior authorization requirement(s). For further questions, call Rodriguez Hevia at [phone] for more information.

## 2021-12-10 DIAGNOSIS — I2510 Atherosclerotic heart disease of native coronary artery without angina pectoris: Secondary | ICD-10-CM | POA: Diagnosis not present

## 2021-12-10 DIAGNOSIS — I509 Heart failure, unspecified: Secondary | ICD-10-CM | POA: Diagnosis not present

## 2021-12-10 DIAGNOSIS — N179 Acute kidney failure, unspecified: Secondary | ICD-10-CM | POA: Diagnosis not present

## 2021-12-10 DIAGNOSIS — R079 Chest pain, unspecified: Secondary | ICD-10-CM

## 2021-12-10 NOTE — Progress Notes (Deleted)
Cardiology Office Note:    Date:  12/10/2021   ID:  Tracey Meyer, DOB [birth year], MRN [id number]  PCP:  Dorna Mai, MD  Cardiologist:  Shirlee More, MD    Referring MD: Dorna Mai, MD    ASSESSMENT:    No diagnosis found. PLAN:    In order of problems listed above:  ***   Next appointment: ***   Medication Adjustments/Labs and Tests Ordered: Current medicines are reviewed at length with the patient today.  Concerns regarding medicines are outlined above.  No orders of the defined types were placed in this encounter.  No orders of the defined types were placed in this encounter.   No chief complaint on file.   History of Present Illness:    Tracey Meyer is a 61 y.o. female with a hx of coronary artery disease with drug-eluting stent to the LAD 09/22/2020 stroke hypertensive heart disease stage II CKD hyperlipidemia and carotid stenosis followed by vascular surgery last seen 07/08/2021.  Compliance with diet, lifestyle and medications: ***  She had an echocardiogram performed 07/25/2021 showing LV dysfunction EF 30 to 35% normal right ventricular size function and pulmonary artery pressure left atrium is mildly enlarged there was moderate mitral regurgitation present.   1. Akinetic anteroseptum. Left ventricular ejection fraction, by  estimation, is 30 to 35%. The left ventricle has moderately decreased  function. The left ventricle demonstrates regional wall motion  abnormalities.   2. Right ventricular systolic function is normal. The right ventricular  size is normal. There is normal pulmonary artery systolic pressure.   3. Left atrial size was mildly dilated.   4. The mitral valve is normal in structure. Moderate mitral valve  regurgitation. No evidence of mitral stenosis.   5. The aortic valve is normal in structure. Aortic valve regurgitation is  not visualized. Aortic valve sclerosis/calcification is present, without  any evidence of  aortic stenosis.   6. The inferior vena cava is normal in size with greater than 50%  respiratory variability, suggesting right atrial pressure of 3 mmHg.  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   Cancer ([place])    Cardiomyopathy, ischemic 09/24/2020   Chronic combined systolic and diastolic congestive heart failure (HCC)    CKD (chronic kidney disease), stage III ([place]) 09/29/2020   Cortical senile cataract of right eye 07/31/2015   DM (diabetes mellitus), type 2 ([place]) 85/88/5027   Embolic stroke ([place]) 7/41/2878   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, 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   Heart failure ([place]) 11/20/2020   HFrEF (heart failure with reduced ejection fraction) ([place]) 09/27/2020   HTN (hypertension)    Hyperlipidemia    Hypertensive urgency 11/21/2020   Hypokalemia    Left-sided weakness    Myocardial infarction [facility])    Nuclear sclerotic cataract of both eyes 07/31/2015   Posterior vitreous detachment of right eye 07/31/2015   Prediabetes    S/P angioplasty with stent to mLAD 09/22/20  09/24/2020   ST elevation myocardial infarction involving left anterior descending (LAD) coronary artery ([place]) 09/22/2020   STEMI involving left anterior descending coronary artery ([place]) 09/22/2020   Stroke ([place])    Tobacco abuse 09/24/2020  Transaminitis 09/24/2020    Past Surgical History:  Procedure Laterality Date   BUBBLE STUDY  10/02/2020   Procedure: BUBBLE STUDY;  Surgeon: Lelon Perla, MD;  Location[facility] ENDOSCOPY;  Service: Cardiovascular;;   CORONARY/GRAFT ACUTE MI REVASCULARIZATION N/A 09/22/2020   Procedure: Coronary/Graft Acute MI Revascularization;  Surgeon:  Martinique, Peter M, MD;  Location: [place] CV LAB;  Service: Cardiovascular;  Laterality: N/A;   LEFT HEART CATH AND CORONARY ANGIOGRAPHY N/A 09/22/2020   Procedure: LEFT HEART CATH AND CORONARY ANGIOGRAPHY;  Surgeon: Martinique, Peter M, MD;  Location: [place] CV LAB;  Service: Cardiovascular;  Laterality: N/A;   mastectomy Left    at age 71 due to concerns of cancer   TEE WITHOUT CARDIOVERSION N/A 10/02/2020   Procedure: TRANSESOPHAGEAL ECHOCARDIOGRAM (TEE);  Surgeon: Lelon Perla, MD;  Location: [facility] ENDOSCOPY;  Service: Cardiovascular;  Laterality: N/A;    Current Medications: No outpatient medications have been marked as taking for the 12/11/21 encounter (Appointment) with Richardo Priest, MD.     Allergies:   Patient has no known allergies.   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    Comment: pending disability as of 11/21/20  Tobacco Use   Smoking status: Some Days    Packs/day: 0.50    Years: 45.00    Total pack years: 22.50    Types: Cigarettes    Last attempt to quit: 09/22/2020    Years since quitting: 1.2    Passive exposure: Past   Smokeless tobacco: Never   Tobacco comments:    Pt states she puffs on cigarettes but don't inhale  Vaping Use   Vaping Use: Never used  Substance and Sexual Activity   Alcohol use: Not Currently   Drug use: Never   Sexual activity: Not on file  Other Topics Concern   Not on file  Social History Narrative   Not on file   Social Determinants of Health   Financial Resource Strain: High Risk (11/30/2020)   Overall Financial Resource Strain (CARDIA)    Difficulty of Paying Living Expenses: Very hard  Food Insecurity: Food Insecurity Present (11/30/2020)   Hunger Vital Sign    Worried About Running Out of Food in the  Last Year: Sometimes true    Ran Out of Food in the Last Year: Sometimes true  Transportation Needs: Unmet Transportation Needs (11/30/2020)   PRAPARE - Hydrologist (Medical): Yes    Lack of Transportation (Non-Medical): Yes  Physical Activity: Not on file  Stress: Not on file  Social Connections: Not on file     Family History: The patient's ***family history includes Cancer in her maternal aunt and mother; Heart disease in her brother; Lupus in her sister. ROS:   Please see the history of present illness.    All other systems reviewed and are negative.  EKGs/Labs/Other Studies Reviewed:    The following studies were reviewed today:  EKG:  EKG ordered today and personally reviewed.  The ekg ordered today demonstrates ***  Recent Labs: 07/08/2021: ALT 21; BUN 25; Creatinine, Ser 1.14; Potassium 4.0; Sodium 141  Recent Lipid Panel    Component Value Date/Time   CHOL 149 07/08/2021 1213   TRIG 67 07/08/2021  1213   HDL 63 07/08/2021 1213   CHOLHDL 2.4 07/08/2021 1213   CHOLHDL 5.2 09/28/2020 0404   VLDL 20 09/28/2020 0404   LDLCALC 73 07/08/2021 1213    Physical Exam:    VS:  There were no vitals taken for this visit.    Wt Readings from Last 3 Encounters:  07/08/21 150 lb (68 kg)  04/26/21 146 lb 9.6 oz (66.5 kg)  02/05/21 145 lb (65.8 kg)     GEN: *** Well nourished, well developed in no acute distress HEENT: Normal NECK: No JVD; No carotid bruits LYMPHATICS: No lymphadenopathy CARDIAC: ***RRR, no murmurs, rubs, gallops RESPIRATORY:  Clear to auscultation without rales, wheezing or rhonchi  ABDOMEN: Soft, non-tender, non-distended MUSCULOSKELETAL:  No edema; No deformity  SKIN: Warm and dry NEUROLOGIC:  Alert and oriented x 3 PSYCHIATRIC:  Normal affect    Signed, Shirlee More, MD  12/10/2021 2:02 PM    [place]

## 2021-12-11 ENCOUNTER — Ambulatory Visit: Payer: Medicaid Other | Admitting: Cardiology

## 2021-12-11 DIAGNOSIS — I2510 Atherosclerotic heart disease of native coronary artery without angina pectoris: Secondary | ICD-10-CM | POA: Diagnosis not present

## 2021-12-11 DIAGNOSIS — N179 Acute kidney failure, unspecified: Secondary | ICD-10-CM | POA: Diagnosis not present

## 2021-12-11 DIAGNOSIS — I509 Heart failure, unspecified: Secondary | ICD-10-CM | POA: Diagnosis not present

## 2021-12-23 ENCOUNTER — Other Ambulatory Visit: Payer: Self-pay

## 2021-12-23 ENCOUNTER — Ambulatory Visit: Payer: Medicaid Other | Attending: Cardiology | Admitting: Cardiology

## 2021-12-23 ENCOUNTER — Encounter: Payer: Self-pay | Admitting: *Deleted

## 2021-12-23 ENCOUNTER — Encounter: Payer: Self-pay | Admitting: Cardiology

## 2021-12-23 VITALS — BP 130/84 | HR 65 | Ht 63.0 in | Wt 145.4 lb

## 2021-12-23 DIAGNOSIS — I5022 Chronic systolic (congestive) heart failure: Secondary | ICD-10-CM

## 2021-12-23 DIAGNOSIS — I2510 Atherosclerotic heart disease of native coronary artery without angina pectoris: Secondary | ICD-10-CM

## 2021-12-23 DIAGNOSIS — I10 Essential (primary) hypertension: Secondary | ICD-10-CM

## 2021-12-23 DIAGNOSIS — Z01812 Encounter for preprocedural laboratory examination: Secondary | ICD-10-CM

## 2021-12-23 MED ORDER — NITROGLYCERIN 0.4 MG SL SUBL: 0.4000 mg | SUBLINGUAL_TABLET | SUBLINGUAL | 6 refills | Status: DC | PRN

## 2021-12-23 NOTE — Pre-Procedure Instructions (Signed)
Instructed patient on the following items: Notified of time change.  Arrival time 1130 Nothing to eat or drink after midnight No meds AM of procedure Responsible person to drive you home and stay with you for 24 hrs Wash with special soap night before and morning of procedure

## 2021-12-23 NOTE — H&P (View-Only) (Signed)
Electrophysiology Office Note   Date:  12/23/2021   ID:  Tracey Meyer, DOB 04/10/1960, MRN [id number]  PCP:  Dorna Mai, MD  Cardiologist:  Bettina Gavia Primary Electrophysiologist:  Kira Hartl Meredith Leeds, MD    Chief Complaint: CHF   History of Present Illness: Tracey Meyer is a 61 y.o. female who is being seen today for the evaluation of CHF at the request of Bettina Gavia, Hilton Cork, MD. Presenting today for electrophysiology evaluation.  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  She is currently on optimal medical therapy for her heart failure.  She is in a wheelchair due to her stroke.  She recently was hospitalized at [facility] due to a heart failure exacerbation.  She has not had chest pain or shortness of breath since her hospitalization.  Today, she denies symptoms of palpitations, chest pain, shortness of breath, orthopnea, PND, lower extremity edema, claudication, dizziness, presyncope, syncope, bleeding, or neurologic sequela. The patient is tolerating medications without difficulties.    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   Cancer ([place])    Cardiomyopathy, ischemic 09/24/2020   Chronic combined systolic and diastolic congestive heart failure (HCC)    CKD (chronic kidney disease), stage III ([place]) 09/29/2020   Cortical senile cataract of right eye 07/31/2015   DM (diabetes mellitus), type 2 ([place]) 82/50/5397   Embolic stroke ([place]) 6/73/4193   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, 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   Heart failure ([place]) 11/20/2020   HFrEF (heart failure with reduced ejection fraction) ([place]) 09/27/2020   HTN (hypertension)    Hyperlipidemia    Hypertensive urgency 11/21/2020   Hypokalemia    Left-sided weakness    Myocardial infarction [facility])    Nuclear sclerotic cataract of both eyes 07/31/2015   Posterior vitreous detachment of right eye 07/31/2015   Prediabetes    S/P angioplasty with stent to mLAD 09/22/20  09/24/2020   ST elevation myocardial infarction involving left anterior descending (LAD) coronary artery ([place]) 09/22/2020   STEMI involving left anterior descending coronary artery ([place]) 09/22/2020   Stroke ([place])    Tobacco abuse 09/24/2020   Transaminitis 09/24/2020   Past Surgical History:  Procedure Laterality Date   BUBBLE STUDY  10/02/2020   Procedure: BUBBLE STUDY;  Surgeon: Lelon Perla, MD;  Location: [place];  Service: Cardiovascular;;   CORONARY/GRAFT ACUTE MI REVASCULARIZATION N/A 09/22/2020   Procedure: Coronary/Graft Acute MI Revascularization;  Surgeon: Martinique, Peter M, MD;  Location: [place] [facility];  Service: Cardiovascular;  Laterality: N/A;   LEFT HEART CATH AND CORONARY ANGIOGRAPHY N/A 09/22/2020   Procedure: LEFT HEART CATH AND CORONARY ANGIOGRAPHY;  Surgeon: Martinique, Peter M, MD;  Location: [place] [facility];  Service: Cardiovascular;  Laterality: N/A;   mastectomy Left    at age 80 due to concerns of cancer   TEE WITHOUT CARDIOVERSION N/A 10/02/2020   Procedure: TRANSESOPHAGEAL ECHOCARDIOGRAM (TEE);  Surgeon: Lelon Perla, MD;  Location: [place];  Service: Cardiovascular;  Laterality: N/A;  Current Outpatient Medications  Medication Sig Dispense Refill   acetaminophen (TYLENOL) 325 MG tablet Take 2 tablets (650 mg total) by mouth every 4 (four) hours as needed for headache or mild pain.     baclofen (LIORESAL) 10 MG tablet Take 1 tablet (10 mg total) by mouth 2 (two) times daily as needed for  muscle spasms. (Patient not taking: Reported on 07/08/2021) 30 each 5   carvedilol (COREG) 3.125 MG tablet Take 1 tablet (3.125 mg total) by mouth 2 (two) times daily with a meal. 180 tablet 3   clopidogrel (PLAVIX) 75 MG tablet Take 1 tablet (75 mg total) by mouth daily. 90 tablet 3   dapagliflozin propanediol (FARXIGA) 10 MG TABS tablet Take 1 tablet (10 mg total) by mouth daily. 90 tablet 3   furosemide (LASIX) 40 MG tablet Take 1 tablet (40 mg total) by mouth daily. 90 tablet 3   nitroGLYCERIN (NITROSTAT) 0.4 MG SL tablet Place 1 tablet (0.4 mg total) under the tongue every 5 (five) minutes x 3 doses as needed for chest pain. 25 tablet 0   rosuvastatin (CRESTOR) 40 MG tablet Take 1 tablet (40 mg total) by mouth daily. 90 tablet 3   sacubitril-valsartan (ENTRESTO) 49-51 MG Take 1 tablet by mouth 2 (two) times daily. 60 tablet 5   spironolactone (ALDACTONE) 25 MG tablet Take 0.5 tablets (12.5 mg total) by mouth daily. 45 tablet 5   No current facility-administered medications for this visit.    Allergies:   Patient has no known allergies.   Social History:  The patient  reports that she quit smoking about 2 weeks ago. Her smoking use included cigarettes. She has a 22.50 pack-year smoking history. She has been exposed to tobacco smoke. She has never used smokeless tobacco. She reports that she does not currently use alcohol. She reports that she does not use drugs.   Family History:  The patient's family history includes Cancer in her maternal aunt and mother; Heart disease in her brother; Lupus in her sister.    ROS:  Please see the history of present illness.   Otherwise, review of systems is positive for none.   All other systems are reviewed and negative.    PHYSICAL EXAM: VS:  BP 130/84   Pulse 65   Ht '5\' 3"'$  (1.6 m)   Wt 145 lb 6.4 oz (66 kg)   SpO2 99%   BMI 25.76 kg/m  , BMI Body mass index is 25.76 kg/m. GEN: Well nourished, well developed, in no acute distress  HEENT: normal   Neck: no JVD, carotid bruits, or masses Cardiac: RRR; no murmurs, rubs, or gallops,no edema  Respiratory:  clear to auscultation bilaterally, normal work of breathing GI: soft, nontender, nondistended, + BS MS: no deformity or atrophy  Skin: warm and dry Neuro:  Strength and sensation are intact Psych: euthymic mood, full affect  EKG:  EKG is ordered today. Personal review of the ekg ordered shows sinus rhythm, rate 65  Recent Labs: 07/08/2021: ALT 21; BUN 25; Creatinine, Ser 1.14; Potassium 4.0; Sodium 141    Lipid Panel     Component Value Date/Time   CHOL 149 07/08/2021 1213   TRIG 67 07/08/2021 1213   HDL 63 07/08/2021 1213   CHOLHDL 2.4 07/08/2021 1213   CHOLHDL 5.2 09/28/2020 0404   VLDL 20 09/28/2020 0404   LDLCALC 73 07/08/2021 1213     Wt Readings from Last 3 Encounters:  12/23/21 145 lb 6.4 oz (66 kg)  07/08/21  150 lb (68 kg)  04/26/21 146 lb 9.6 oz (66.5 kg)      Other studies Reviewed: Additional studies/ records that were reviewed today include: TTE 12/07/21  Review of the above records today demonstrates:  Ejection fraction 30 to 35% Anteroseptal hypokinesis to akinesis Moderately dilated left atrium No significant valvular abnormality   ASSESSMENT AND PLAN:  1.  Coronary artery disease: Currently on aspirin and Plavix.  No current chest pain.  Plan per primary cardiology.  2.  Chronic systolic heart failure: Due to ischemic cardiomyopathy.  Ejection fraction 30 to 35%.  Currently on optimal medical therapy with carvedilol 3.125 mg twice daily, Farxiga 10 mg daily, Entresto 49/51 mg twice daily, Aldactone 12.5 mg daily.  She has a persistently reduced ejection fraction despite optimal medical therapy.  She would benefit from ICD implant.  Risk and benefits were discussed.  Risk include bleeding, tamponade, infection, pneumothorax, lead dislodgment, inappropriate shocks, death.  She understands these risks and is agreed to the procedure.  3.   Hyperlipidemia: Goal LDL less than 70.  Continue statin per primary cardiology  4.  Hypertension: Currently well controlled  Current medicines are reviewed at length with the patient today.   The patient does not have concerns regarding her medicines.  The following changes were made today:  none  Labs/ tests ordered today include:  Orders Placed This Encounter  Procedures   Basic metabolic panel   CBC   EKG 12-Lead     Disposition:   FU with Yamato Kopf 3 months  Signed, Ottie Neglia Meredith Leeds, MD  12/23/2021 11:01 AM     [facility] [address] [place] [address] [phone] (office) [phone] (fax)

## 2021-12-23 NOTE — Patient Instructions (Addendum)
Medication Instructions:  Your physician recommends that you continue on your current medications as directed. Please refer to the Current Medication list given to you today.  *If you need a refill on your cardiac medications before your next appointment, please call your pharmacy*   Lab Work: Pre procedure lab work today: BMET & CBC  If you have labs (blood work) drawn today and your tests are completely normal, you will receive your results only by: [place] (if you have MyChart) OR A paper copy in the mail If you have any lab test that is abnormal or we need to change your treatment, we will call you to review the results.   Testing/Procedures: Your physician has recommended that you have a defibrillator inserted. An implantable cardioverter defibrillator (ICD) is a small device that is placed in your chest or, in rare cases, your abdomen. This device uses electrical pulses or shocks to help control life-threatening, irregular heartbeats that could lead the heart to suddenly stop beating (sudden cardiac arrest). Leads are attached to the ICD that goes into your heart. This is done in the hospital and usually requires an overnight stay. Please see the instruction sheet given to you today for more information.    Follow-Up: At [facility], you and your health needs are our priority.  As part of our continuing mission to provide you with exceptional heart care, we have created designated Provider Care Teams.  These Care Teams include your primary Cardiologist (physician) and Advanced Practice Providers (APPs -  Physician Assistants and Nurse Practitioners) who all work together to provide you with the care you need, when you need it.  We recommend signing up for the patient portal called "MyChart".  Sign up information is provided on this After Visit Summary.  MyChart is used to connect with patients for Virtual Visits (Telemedicine).  Patients are able to view lab/test results,  encounter notes, upcoming appointments, etc.  Non-urgent messages can be sent to your provider as well.   To learn more about what you can do with MyChart, go to [email or web address].    Your next appointment:   2 week(s) after your defibrillator implant   The format for your next appointment:   In Person  Provider:   Device clinic for a wound check     Thank you for choosing [facility]!!   Trinidad Curet, RN [phone]  Other Instructions   Cardioverter Defibrillator Implantation An implantable cardioverter defibrillator (ICD) is a small, lightweight, battery-powered device that is placed (implanted) under the skin in the chest or abdomen. Your caregiver may prescribe an ICD if: You have had an irregular heart rhythm (arrhythmia) that originated in the lower chambers of the heart (ventricles). Your heart has been damaged by a disease (such as coronary artery disease) or heart condition (such as a heart attack). An ICD consists of a battery that lasts several years, a small computer called a pulse generator, and wires called leads that go into the heart. It is used to detect and correct two dangerous arrhythmias: a rapid heart rhythm (tachycardia) and an arrhythmia in which the ventricles contract in an uncoordinated way (fibrillation). When an ICD detects tachycardia, it sends an electrical signal to the heart that restores the heartbeat to normal (cardioversion). This signal is usually painless. If cardioversion does not work or if the ICD detects fibrillation, it delivers a small electrical shock to the heart (defibrillation) to restart the heart. The shock may feel like a strong jolt in the  chest. ICDs may be programmed to correct other problems. Sometimes, ICDs are programmed to act as another type of implantable device called a pacemaker. Pacemakers are used to treat a slow heartbeat (bradycardia). LET YOUR CAREGIVER KNOW ABOUT: Any allergies you have. All medicines you  are taking, including vitamins, herbs, eyedrops, and over-the-counter medicines and creams. Previous problems you or members of your family have had with the use of anesthetics. Any blood disorders you have had. Other health problems you have. RISKS AND COMPLICATIONS Generally, the procedure to implant an ICD is safe. However, as with any surgical procedure, complications can occur. Possible complications associated with implanting an ICD include: Swelling, bleeding, or bruising at the site where the ICD was implanted. Infection at the site where the ICD was implanted. A reaction to medicine used during the procedure. Nerve, heart, or blood vessel damage. Blood clots. BEFORE THE PROCEDURE You may need to have blood tests, heart tests, or a chest X-ray done before the day of the procedure. Ask your caregiver about changing or stopping your regular medicines. Make plans to have someone drive you home. You may need to stay in the hospital overnight after the procedure. Stop smoking at least 24 hours before the procedure. Take a bath or shower the night before the procedure. You may need to scrub your chest or abdomen with a special type of soap. Do not eat or drink before your procedure for as long as directed by your caregiver. Ask if it is okay to take any needed medicine with a small sip of water. PROCEDURE  The procedure to implant an ICD in your chest or abdomen is usually done at a hospital in a room that has a large X-ray machine called a fluoroscope. The machine will be above you during the procedure. It will help your caregiver see your heart during the procedure. Implanting an ICD usually takes 1-3 hours. Before the procedure:  Small monitors will be put on your body. They will be used to check your heart, blood pressure, and oxygen level. A needle will be put into a vein in your hand or arm. This is called an intravenous (IV) access tube. Fluids and medicine will flow directly into your  body through the IV tube. Your chest or abdomen will be cleaned with a germ-killing (antiseptic) solution. The area may be shaved. You may be given medicine to help you relax (sedative). You will be given a medicine called a local anesthetic. This medicine will make the surgical site numb while the ICD is implanted. You will be sleepy but awake during the procedure. After you are numb the procedure will begin. The caregiver will: Make a small cut (incision). This will make a pocket deep under your skin that will hold the pulse generator. Guide the leads through a large blood vessel into your heart and attach them to the heart muscles. Depending on the ICD, the leads may go into one ventricle or they may go to both ventricles and into an upper chamber of the heart (atrium). Test the ICD. Close the incision with stitches, glue, or staples. AFTER THE PROCEDURE You may feel pain. Some pain is normal. It may last a few days. You may stay in a recovery area until the local anesthetic has worn off. Your blood pressure and pulse will be checked often. You will be taken to a room where your heart will be monitored. A chest X-ray will be taken. This is done to check that the cardioverter defibrillator  is in the right place. You may stay in the hospital overnight. A slight bump may be seen over the skin where the ICD was placed. Sometimes, it is possible to feel the ICD under the skin. This is normal. In the months and years afterward, your caregiver will check the device, the leads, and the battery every few months. Eventually, when the battery is low, the ICD will be replaced.   This information is not intended to replace advice given to you by your health care provider. Make sure you discuss any questions you have with your health care provider.   Document Released: 11/30/2001 Document Revised: 12/29/2012 Document Reviewed: 03/29/2012 Elsevier Interactive Patient Education 2016 [place] Defibrillator Implantation, Care After This sheet gives you information about how to care for yourself after your procedure. Your health care provider may also give you more specific instructions. If you have problems or questions, contact your health care provider. What can I expect after the procedure? After the procedure, it is common to have: Some pain. It may last a few days. A slight bump over the skin where the device was placed. Sometimes, it is possible to feel the device under the skin. This is normal.  During the months and years after your procedure, your health care provider will check the device, the leads, and the battery every few months. Eventually, when the battery is low, the device will be replaced. Follow these instructions at home: Medicines Take over-the-counter and prescription medicines only as told by your health care provider. If you were prescribed an antibiotic medicine, take it as told by your health care provider. Do not stop taking the antibiotic even if you start to feel better. Incision care  Follow instructions from your health care provider about how to take care of your incision area. Make sure you: Wash your hands with soap and water before you change your bandage (dressing). If soap and water are not available, use hand sanitizer. Change your dressing as told by your health care provider. Leave stitches (sutures), skin glue, or adhesive strips in place. These skin closures may need to stay in place for 2 weeks or longer. If adhesive strip edges start to loosen and curl up, you may trim the loose edges. Do not remove adhesive strips completely unless your health care provider tells you to do that. Check your incision area every day for signs of infection. Check for: More redness, swelling, or pain. More fluid or blood. Warmth. Pus or a bad smell. Do not use lotions or ointments near the incision area unless told by your health care  provider. Keep the incision area clean and dry for 2-3 days after the procedure or for as long as told by your health care provider. It takes several weeks for the incision site to heal completely. Do not take baths, swim, or use a hot tub until your health care provider approves. Activity Try to walk a little every day. Exercising is important after this procedure. Also, use your shoulder on the side of the defibrillator in daily tasks that do not require a lot of motion. For at least 6 weeks: Do not lift your upper arm above your shoulders. This means no tennis, golf, or swimming for this period of time. If you tend to sleep with your arm above your head, use a restraint to prevent this during sleep. Avoid sudden jerking, pulling, or chopping movements that pull your upper arm far away from your  body. Ask your health care provider when you may go back to work. Check with your health care provider before you start to drive or play sports. Electric and magnetic fields Tell all health care providers that you have a defibrillator. This may prevent them from giving you an MRI scan because strong magnets are used for that test. If you must pass through a metal detector, quickly walk through it. Do not stop under the detector, and do not stand near it. Avoid places or objects that have a strong electric or magnetic field, including: Engineer, maintenance. At the airport, let officials know that you have a defibrillator. Your defibrillator ID card will let you be checked in a way that is safe for you and will not damage your defibrillator. Also, do not let a security person wave a magnetic wand near your defibrillator. That can make it stop working. Power plants. Large electrical generators. Anti-theft systems or electronic article surveillance (EAS). Radiofrequency transmission towers, such as cell phone and radio towers. Do not use amateur (ham) radio equipment or electric (arc) welding torches. Some  devices are safe to use if held at least 12 inches (30 cm) from your defibrillator. These include power tools, lawn mowers, and speakers. If you are unsure if something is safe to use, ask your health care provider. Do not use MP3 player headphones. They have magnets. You may safely use electric blankets, heating pads, computers, and microwave ovens. When using your cell phone, hold it to the ear that is on the opposite side from the defibrillator. Do not leave your cell phone in a pocket over the defibrillator. General instructions Follow diet instructions from your health care provider, if this applies. Always keep your defibrillator ID card with you. The card should list the implant date, device model, and manufacturer. Consider wearing a medical alert bracelet or necklace. Have your defibrillator checked every 3-6 months or as often as told by your health care provider. Most defibrillators last for 4-8 years. Keep all follow-up visits as told by your health care provider. This is important for your health care provider to make sure your chest is healing the way it should. Ask your health care provider when you should come back to have your stitches or staples taken out. Contact a health care provider if: You feel one shock in your chest. You gain weight suddenly. Your legs or feet swell more than they have before. It feels like your heart is fluttering or skipping beats (heart palpitations). You have more redness, swelling, or pain around your incision. You have more fluid or blood coming from your incision. Your incision feels warm to the touch. You have pus or a bad smell coming from your incision. You have a fever. Get help right away if: You have chest pain. You feel more than one shock. You feel more short of breath than you have felt before. You feel more light-headed than you have felt before. Your incision starts to open up. This information is not intended to replace advice  given to you by your health care provider. Make sure you discuss any questions you have with your health care provider. Document Released: 09/27/2004 Document Revised: 09/28/2015 Document Reviewed: 08/15/2015 Elsevier Interactive Patient Education  2018 [place] Discharge Instructions for  Pacemaker/Defibrillator Patients  ACTIVITY No heavy lifting or vigorous activity with your left/right arm for 6 to 8 weeks.  Do not raise your left/right arm above your head for one  week.  Gradually raise your affected arm as drawn below.           __  NO DRIVING for     ; you may begin driving on     .  WOUND CARE Keep the wound area clean and dry.  Do not get this area wet for one week. No showers for one week; you may shower on     . The tape/steri-strips on your wound will fall off; do not pull them off.  No bandage is needed on the site.  DO  NOT apply any creams, oils, or ointments to the wound area. If you notice any drainage or discharge from the wound, any swelling or bruising at the site, or you develop a fever > 101? F after you are discharged home, call the office at once.  SPECIAL INSTRUCTIONS You are still able to use cellular telephones; use the ear opposite the side where you have your pacemaker/defibrillator.  Avoid carrying your cellular phone near your device. When traveling through airports, show security personnel your identification card to avoid being screened in the metal detectors.  Ask the security personnel to use the hand wand. Avoid arc welding equipment, MRI testing (magnetic resonance imaging), TENS units (transcutaneous nerve stimulators).  Call the office for questions about other devices. Avoid electrical appliances that are in poor condition or are not properly grounded. Microwave ovens are safe to be near or to operate.  ADDITIONAL INFORMATION FOR DEFIBRILLATOR PATIENTS SHOULD YOUR DEVICE GO OFF: If your device goes off ONCE and you feel  fine afterward, notify the device clinic nurses. If your device goes off ONCE and you do not feel well afterward, call 911. If your device goes off TWICE, call 911. If your device goes off THREE TIMES IN ONE DAY, call 911.  DO NOT DRIVE YOURSELF OR A FAMILY MEMBER WITH A DEFIBRILLATOR TO THE HOSPITAL--CALL 911.

## 2021-12-23 NOTE — Progress Notes (Signed)
Electrophysiology Office Note   Date:  12/23/2021   ID:  Tracey Meyer, DOB [birth year], MRN [id number]  PCP:  Dorna Mai, MD  Cardiologist:  Bettina Gavia Primary Electrophysiologist:  Amelia Burgard Meredith Leeds, MD    Chief Complaint: CHF   History of Present Illness: Tracey Meyer is a 61 y.o. female who is being seen today for the evaluation of CHF at the request of Bettina Gavia, Hilton Cork, MD. Presenting today for electrophysiology evaluation.  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  She is currently on optimal medical therapy for her heart failure.  She is in a wheelchair due to her stroke.  She recently was hospitalized at [facility] due to a heart failure exacerbation.  She has not had chest pain or shortness of breath since her hospitalization.  Today, she denies symptoms of palpitations, chest pain, shortness of breath, orthopnea, PND, lower extremity edema, claudication, dizziness, presyncope, syncope, bleeding, or neurologic sequela. The patient is tolerating medications without difficulties.    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   Cancer ([place])    Cardiomyopathy, ischemic 09/24/2020   Chronic combined systolic and diastolic congestive heart failure (HCC)    CKD (chronic kidney disease), stage III ([place]) 09/29/2020   Cortical senile cataract of right eye 07/31/2015   DM (diabetes mellitus), type 2 ([place]) 51/70/0174   Embolic stroke ([place]) 9/44/9675   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, 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   Heart failure ([place]) 11/20/2020   HFrEF (heart failure with reduced ejection fraction) ([place]) 09/27/2020   HTN (hypertension)    Hyperlipidemia    Hypertensive urgency 11/21/2020   Hypokalemia    Left-sided weakness    Myocardial infarction [facility])    Nuclear sclerotic cataract of both eyes 07/31/2015   Posterior vitreous detachment of right eye 07/31/2015   Prediabetes    S/P angioplasty with stent to mLAD 09/22/20  09/24/2020   ST elevation myocardial infarction involving left anterior descending (LAD) coronary artery ([place]) 09/22/2020   STEMI involving left anterior descending coronary artery ([place]) 09/22/2020   Stroke ([place])    Tobacco abuse 09/24/2020   Transaminitis 09/24/2020   Past Surgical History:  Procedure Laterality Date   BUBBLE STUDY  10/02/2020   Procedure: BUBBLE STUDY;  Surgeon: Lelon Perla, MD;  Location: [place];  Service: Cardiovascular;;   CORONARY/GRAFT ACUTE MI REVASCULARIZATION N/A 09/22/2020   Procedure: Coronary/Graft Acute MI Revascularization;  Surgeon: Martinique, Peter M, MD;  Location: [place] CV [facility];  Service: Cardiovascular;  Laterality: N/A;   LEFT HEART CATH AND CORONARY ANGIOGRAPHY N/A 09/22/2020   Procedure: LEFT HEART CATH AND CORONARY ANGIOGRAPHY;  Surgeon: Martinique, Peter M, MD;  Location: [place] [facility];  Service: Cardiovascular;  Laterality: N/A;   mastectomy Left    at age 69 due to concerns of cancer   TEE WITHOUT CARDIOVERSION N/A 10/02/2020   Procedure: TRANSESOPHAGEAL ECHOCARDIOGRAM (TEE);  Surgeon: Lelon Perla, MD;  Location: [place];  Service: Cardiovascular;  Laterality: N/A;  Current Outpatient Medications  Medication Sig Dispense Refill   acetaminophen (TYLENOL) 325 MG tablet Take 2 tablets (650 mg total) by mouth every 4 (four) hours as needed for headache or mild pain.     baclofen (LIORESAL) 10 MG tablet Take 1 tablet (10 mg total) by mouth 2 (two) times daily as needed for  muscle spasms. (Patient not taking: Reported on 07/08/2021) 30 each 5   carvedilol (COREG) 3.125 MG tablet Take 1 tablet (3.125 mg total) by mouth 2 (two) times daily with a meal. 180 tablet 3   clopidogrel (PLAVIX) 75 MG tablet Take 1 tablet (75 mg total) by mouth daily. 90 tablet 3   dapagliflozin propanediol (FARXIGA) 10 MG TABS tablet Take 1 tablet (10 mg total) by mouth daily. 90 tablet 3   furosemide (LASIX) 40 MG tablet Take 1 tablet (40 mg total) by mouth daily. 90 tablet 3   nitroGLYCERIN (NITROSTAT) 0.4 MG SL tablet Place 1 tablet (0.4 mg total) under the tongue every 5 (five) minutes x 3 doses as needed for chest pain. 25 tablet 0   rosuvastatin (CRESTOR) 40 MG tablet Take 1 tablet (40 mg total) by mouth daily. 90 tablet 3   sacubitril-valsartan (ENTRESTO) 49-51 MG Take 1 tablet by mouth 2 (two) times daily. 60 tablet 5   spironolactone (ALDACTONE) 25 MG tablet Take 0.5 tablets (12.5 mg total) by mouth daily. 45 tablet 5   No current facility-administered medications for this visit.    Allergies:   Patient has no known allergies.   Social History:  The patient  reports that she quit smoking about 2 weeks ago. Her smoking use included cigarettes. She has a 22.50 pack-year smoking history. She has been exposed to tobacco smoke. She has never used smokeless tobacco. She reports that she does not currently use alcohol. She reports that she does not use drugs.   Family History:  The patient's family history includes Cancer in her maternal aunt and mother; Heart disease in her brother; Lupus in her sister.    ROS:  Please see the history of present illness.   Otherwise, review of systems is positive for none.   All other systems are reviewed and negative.    PHYSICAL EXAM: VS:  BP 130/84   Pulse 65   Ht '5\' 3"'$  (1.6 m)   Wt 145 lb 6.4 oz (66 kg)   SpO2 99%   BMI 25.76 kg/m  , BMI Body mass index is 25.76 kg/m. GEN: Well nourished, well developed, in no acute distress  HEENT: normal   Neck: no JVD, carotid bruits, or masses Cardiac: RRR; no murmurs, rubs, or gallops,no edema  Respiratory:  clear to auscultation bilaterally, normal work of breathing GI: soft, nontender, nondistended, + BS MS: no deformity or atrophy  Skin: warm and dry Neuro:  Strength and sensation are intact Psych: euthymic mood, full affect  EKG:  EKG is ordered today. Personal review of the ekg ordered shows sinus rhythm, rate 65  Recent Labs: 07/08/2021: ALT 21; BUN 25; Creatinine, Ser 1.14; Potassium 4.0; Sodium 141    Lipid Panel     Component Value Date/Time   CHOL 149 07/08/2021 1213   TRIG 67 07/08/2021 1213   HDL 63 07/08/2021 1213   CHOLHDL 2.4 07/08/2021 1213   CHOLHDL 5.2 09/28/2020 0404   VLDL 20 09/28/2020 0404   LDLCALC 73 07/08/2021 1213     Wt Readings from Last 3 Encounters:  12/23/21 145 lb 6.4 oz (66 kg)  07/08/21  150 lb (68 kg)  04/26/21 146 lb 9.6 oz (66.5 kg)      Other studies Reviewed: Additional studies/ records that were reviewed today include: TTE 12/07/21  Review of the above records today demonstrates:  Ejection fraction 30 to 35% Anteroseptal hypokinesis to akinesis Moderately dilated left atrium No significant valvular abnormality   ASSESSMENT AND PLAN:  1.  Coronary artery disease: Currently on aspirin and Plavix.  No current chest pain.  Plan per primary cardiology.  2.  Chronic systolic heart failure: Due to ischemic cardiomyopathy.  Ejection fraction 30 to 35%.  Currently on optimal medical therapy with carvedilol 3.125 mg twice daily, Farxiga 10 mg daily, Entresto 49/51 mg twice daily, Aldactone 12.5 mg daily.  She has a persistently reduced ejection fraction despite optimal medical therapy.  She would benefit from ICD implant.  Risk and benefits were discussed.  Risk include bleeding, tamponade, infection, pneumothorax, lead dislodgment, inappropriate shocks, death.  She understands these risks and is agreed to the procedure.  3.   Hyperlipidemia: Goal LDL less than 70.  Continue statin per primary cardiology  4.  Hypertension: Currently well controlled  Current medicines are reviewed at length with the patient today.   The patient does not have concerns regarding her medicines.  The following changes were made today:  none  Labs/ tests ordered today include:  Orders Placed This Encounter  Procedures   Basic metabolic panel   CBC   EKG 12-Lead     Disposition:   FU with Tracey Meyer 3 months  Signed, Tracey Purdy Meredith Leeds, MD  12/23/2021 11:01 AM     [facility] [address] [place] [address] [phone] (office) [phone] (fax)

## 2021-12-24 ENCOUNTER — Ambulatory Visit (HOSPITAL_COMMUNITY)
Admission: RE | Disposition: A | Discharge status: Home / Self Care | Payer: Self-pay | Source: Home / Self Care | Attending: Cardiology

## 2021-12-24 ENCOUNTER — Ambulatory Visit (HOSPITAL_COMMUNITY): Payer: Medicaid Other

## 2021-12-24 ENCOUNTER — Other Ambulatory Visit: Payer: Self-pay

## 2021-12-24 ENCOUNTER — Ambulatory Visit (HOSPITAL_COMMUNITY)
Admission: RE | Admit: 2021-12-24 | Discharge: 2021-12-24 | Disposition: A | Discharge status: Home / Self Care | Payer: Medicaid Other | Attending: Cardiology | Admitting: Cardiology

## 2021-12-24 DIAGNOSIS — I130 Hypertensive heart and chronic kidney disease with heart failure and stage 1 through stage 4 chronic kidney disease, or unspecified chronic kidney disease: Secondary | ICD-10-CM | POA: Insufficient documentation

## 2021-12-24 DIAGNOSIS — E785 Hyperlipidemia, unspecified: Secondary | ICD-10-CM | POA: Diagnosis not present

## 2021-12-24 DIAGNOSIS — Z993 Dependence on wheelchair: Secondary | ICD-10-CM | POA: Diagnosis not present

## 2021-12-24 DIAGNOSIS — Z7984 Long term (current) use of oral hypoglycemic drugs: Secondary | ICD-10-CM | POA: Insufficient documentation

## 2021-12-24 DIAGNOSIS — Z8673 Personal history of transient ischemic attack (TIA), and cerebral infarction without residual deficits: Secondary | ICD-10-CM | POA: Insufficient documentation

## 2021-12-24 DIAGNOSIS — I2510 Atherosclerotic heart disease of native coronary artery without angina pectoris: Secondary | ICD-10-CM | POA: Insufficient documentation

## 2021-12-24 DIAGNOSIS — Z7982 Long term (current) use of aspirin: Secondary | ICD-10-CM | POA: Insufficient documentation

## 2021-12-24 DIAGNOSIS — Z7902 Long term (current) use of antithrombotics/antiplatelets: Secondary | ICD-10-CM | POA: Insufficient documentation

## 2021-12-24 DIAGNOSIS — I255 Ischemic cardiomyopathy: Secondary | ICD-10-CM | POA: Diagnosis present

## 2021-12-24 DIAGNOSIS — E1122 Type 2 diabetes mellitus with diabetic chronic kidney disease: Secondary | ICD-10-CM | POA: Insufficient documentation

## 2021-12-24 DIAGNOSIS — Z79899 Other long term (current) drug therapy: Secondary | ICD-10-CM | POA: Diagnosis not present

## 2021-12-24 DIAGNOSIS — Z955 Presence of coronary angioplasty implant and graft: Secondary | ICD-10-CM | POA: Insufficient documentation

## 2021-12-24 DIAGNOSIS — N183 Chronic kidney disease, stage 3 unspecified: Secondary | ICD-10-CM | POA: Insufficient documentation

## 2021-12-24 DIAGNOSIS — I6522 Occlusion and stenosis of left carotid artery: Secondary | ICD-10-CM | POA: Insufficient documentation

## 2021-12-24 DIAGNOSIS — Z87891 Personal history of nicotine dependence: Secondary | ICD-10-CM | POA: Insufficient documentation

## 2021-12-24 DIAGNOSIS — I5042 Chronic combined systolic (congestive) and diastolic (congestive) heart failure: Secondary | ICD-10-CM | POA: Diagnosis not present

## 2021-12-24 HISTORY — PX: ICD IMPLANT: EP1208

## 2021-12-24 LAB — BASIC METABOLIC PANEL
BUN/Creatinine Ratio: 19 (ref 12–28)
BUN: 26 mg/dL (ref 8–27)
CO2: 20 mmol/L (ref 20–29)
Calcium: 9.9 mg/dL (ref 8.7–10.3)
Chloride: 104 mmol/L (ref 96–106)
Creatinine, Ser: 1.39 mg/dL — ABNORMAL HIGH (ref 0.57–1.00)
Glucose: 114 mg/dL — ABNORMAL HIGH (ref 70–99)
Potassium: 4.4 mmol/L (ref 3.5–5.2)
Sodium: 138 mmol/L (ref 134–144)
eGFR: 43 mL/min/{1.73_m2} — ABNORMAL LOW (ref >59)

## 2021-12-24 LAB — CBC
Hematocrit: 36.7 % (ref 34.0–46.6)
Hemoglobin: 11.7 g/dL (ref 11.1–15.9)
MCH: 28.6 pg (ref 26.6–33.0)
MCHC: 31.9 g/dL (ref 31.5–35.7)
MCV: 90 fL (ref 79–97)
Platelets: 362 10*3/uL (ref 150–450)
RBC: 4.09 x10E6/uL (ref 3.77–5.28)
RDW: 14.2 % (ref 11.7–15.4)
WBC: 5.4 10*3/uL (ref 3.4–10.8)

## 2021-12-24 SURGERY — ICD IMPLANT

## 2021-12-24 MED ORDER — HEPARIN (PORCINE) IN NACL 1000-0.9 UT/500ML-% IV SOLN
INTRAVENOUS | Status: AC
  Filled 2021-12-24: qty 500

## 2021-12-24 MED ORDER — CHLORHEXIDINE GLUCONATE 4 % EX LIQD: 4.0000 | Freq: Once | CUTANEOUS | Status: DC

## 2021-12-24 MED ORDER — SODIUM CHLORIDE 0.9 % IV SOLN: INTRAVENOUS | Status: DC

## 2021-12-24 MED ORDER — CEFAZOLIN SODIUM-DEXTROSE 2-4 GM/100ML-% IV SOLN
2.0000 g | INTRAVENOUS | Status: AC
  Administered 2021-12-24: 2 g via INTRAVENOUS

## 2021-12-24 MED ORDER — CEFAZOLIN SODIUM-DEXTROSE 1-4 GM/50ML-% IV SOLN
1.0000 g | Freq: Once | INTRAVENOUS | Status: AC
  Administered 2021-12-24: 1 g via INTRAVENOUS

## 2021-12-24 MED ORDER — SODIUM CHLORIDE 0.9 % IV SOLN
INTRAVENOUS | Status: AC
  Filled 2021-12-24: qty 2

## 2021-12-24 MED ORDER — SODIUM CHLORIDE 0.9 % IV SOLN
80.0000 mg | INTRAVENOUS | Status: AC
  Administered 2021-12-24: 80 mg

## 2021-12-24 MED ORDER — ACETAMINOPHEN 325 MG PO TABS
325.0000 mg | ORAL_TABLET | ORAL | Status: DC | PRN
  Administered 2021-12-24: 650 mg via ORAL
  Filled 2021-12-24: qty 2

## 2021-12-24 MED ORDER — MIDAZOLAM HCL 5 MG/5ML IJ SOLN
INTRAMUSCULAR | Status: DC | PRN
  Administered 2021-12-24 (×2): 1 mg via INTRAVENOUS

## 2021-12-24 MED ORDER — FENTANYL CITRATE (PF) 100 MCG/2ML IJ SOLN
INTRAMUSCULAR | Status: AC
  Filled 2021-12-24: qty 2

## 2021-12-24 MED ORDER — LIDOCAINE HCL 1 % IJ SOLN
INTRAMUSCULAR | Status: AC
  Filled 2021-12-24: qty 20

## 2021-12-24 MED ORDER — CEFAZOLIN SODIUM-DEXTROSE 1-4 GM/50ML-% IV SOLN
1.0000 g | Freq: Four times a day (QID) | INTRAVENOUS | Status: DC
  Filled 2021-12-24: qty 50

## 2021-12-24 MED ORDER — HEPARIN (PORCINE) IN NACL 1000-0.9 UT/500ML-% IV SOLN
INTRAVENOUS | Status: DC | PRN
  Administered 2021-12-24: 500 mL

## 2021-12-24 MED ORDER — LIDOCAINE HCL (PF) 1 % IJ SOLN
INTRAMUSCULAR | Status: DC | PRN
  Administered 2021-12-24: 60 mL

## 2021-12-24 MED ORDER — ONDANSETRON HCL 4 MG/2ML IJ SOLN: 4.0000 mg | Freq: Four times a day (QID) | INTRAMUSCULAR | Status: DC | PRN

## 2021-12-24 MED ORDER — CEFAZOLIN SODIUM-DEXTROSE 2-4 GM/100ML-% IV SOLN
INTRAVENOUS | Status: AC
  Filled 2021-12-24: qty 100

## 2021-12-24 MED ORDER — FENTANYL CITRATE (PF) 100 MCG/2ML IJ SOLN
INTRAMUSCULAR | Status: DC | PRN
  Administered 2021-12-24 (×2): 25 ug via INTRAVENOUS

## 2021-12-24 MED ORDER — MIDAZOLAM HCL 5 MG/5ML IJ SOLN
INTRAMUSCULAR | Status: AC
  Filled 2021-12-24: qty 5

## 2021-12-24 SURGICAL SUPPLY — 7 items
CABLE SURGICAL S-101-97-12 (CABLE) ×1 IMPLANT
ICD GALLANT VR CDVRA500Q (ICD Generator) IMPLANT
KIT MICROPUNCTURE NIT STIFF (SHEATH) IMPLANT
LEAD DURATA 7122Q-65CM (Lead) IMPLANT
PAD DEFIB RADIO PHYSIO CONN (PAD) ×1 IMPLANT
SHEATH 7FR PRELUDE SNAP 13 (SHEATH) IMPLANT
TRAY PACEMAKER INSERTION (PACKS) ×1 IMPLANT

## 2021-12-24 NOTE — Progress Notes (Signed)
Dr Curt Bears notified of post CXR-advised to proceed with d/c

## 2021-12-24 NOTE — Progress Notes (Signed)
Pt assisted with redress and R UE shoulder immobilizer

## 2021-12-24 NOTE — Progress Notes (Signed)
Pt states pain to upper back continues-pt assisted from bed to recliner for comfort-noticed soft lump to upper back-palpable pain/pain site of upper back pain-states area has been there x "years"-pt advised to have area checked by PCP-agreed

## 2021-12-24 NOTE — Discharge Instructions (Signed)
After Your ICD (Implantable Cardiac Defibrillator)   You have a Abbott ICD  ACTIVITY Do not lift your arm above shoulder height for 1 week after your procedure. After 7 days, you may progress as below.  You should remove your sling 24 hours after your procedure, unless otherwise instructed by your provider.     Tuesday December 31, 2021  Wednesday January 01, 2022 Thursday January 02, 2022 Friday January 03, 2022   Do not lift, push, pull, or carry anything over 10 pounds with the affected arm until 6 weeks (Tuesday February 04, 2022 ) after your procedure.   You may drive AFTER your wound check, unless you have been told otherwise by your provider.   Ask your healthcare provider when you can go back to work   INCISION/Dressing If you are on a blood thinner such as Coumadin, Xarelto, Eliquis, Plavix, or Pradaxa please confirm with your provider when this should be resumed.   If large square, outer bandage is left in place, this can be removed after 24 hours from your procedure. Do not remove steri-strips or glue as below.   Monitor your defibrillator site for redness, swelling, and drainage. Call the device clinic at [phone] if you experience these symptoms or fever/chills.  If your incision is sealed with Steri-strips or staples, you may shower 7 days after your procedure or when told by your provider. Do not remove the steri-strips or let the shower hit directly on your site. You may wash around your site with soap and water.    If you were discharged in a sling, please do not wear this during the day more than 48 hours after your surgery unless otherwise instructed. This may increase the risk of stiffness and soreness in your shoulder.   Avoid lotions, ointments, or perfumes over your incision until it is well-healed.  You may use a hot tub or a pool AFTER your wound check appointment if the incision is completely closed.  Your ICD is designed to protect you from life  threatening heart rhythms. Because of this, you may receive a shock.   1 shock with no symptoms:  Call the office during business hours. 1 shock with symptoms (chest pain, chest pressure, dizziness, lightheadedness, shortness of breath, overall feeling unwell):  Call 911. If you experience 2 or more shocks in 24 hours:  Call 911. If you receive a shock, you should not drive for 6 months per the [place] DMV IF you receive appropriate therapy from your ICD.   ICD Alerts:  Some alerts are vibratory and others beep. These are NOT emergencies. Please call our office to let us know. If this occurs at night or on weekends, it can wait until the next business day. Send a remote transmission.  If your device is capable of reading fluid status (for heart failure), you will be offered monthly monitoring to review this with you.   DEVICE MANAGEMENT Remote monitoring is used to monitor your ICD from home. This monitoring is scheduled every 91 days by our office. It allows [place] to keep an eye on the functioning of your device to ensure it is working properly. You will routinely see your Electrophysiologist annually (more often if necessary).   You should receive your ID card for your new device in 4-8 weeks. Keep this card with you at all times once received. Consider wearing a medical alert bracelet or necklace.  Your ICD  may be MRI compatible. This will be discussed at your next  office visit/wound check.  You should avoid contact with strong electric or magnetic fields.   Do not use amateur (ham) radio equipment or electric (arc) welding torches. MP3 player headphones with magnets should not be used. Some devices are safe to use if held at least 12 inches (30 cm) from your defibrillator. These include power tools, lawn mowers, and speakers. If you are unsure if something is safe to use, ask your health care provider.  When using your cell phone, hold it to the ear that is on the opposite side from the  defibrillator. Do not leave your cell phone in a pocket over the defibrillator.  You may safely use electric blankets, heating pads, computers, and microwave ovens.  Call the office right away if: You have chest pain. You feel more than one shock. You feel more short of breath than you have felt before. You feel more light-headed than you have felt before. Your incision starts to open up.  This information is not intended to replace advice given to you by your health care provider. Make sure you discuss any questions you have with your health care provider.

## 2021-12-24 NOTE — Interval H&P Note (Signed)
History and Physical Interval Note:  12/24/2021 2:49 PM  Tracey Meyer  has presented today for surgery, with the diagnosis of caridomyopathy.  The various methods of treatment have been discussed with the patient and family. After consideration of risks, benefits and other options for treatment, the patient has consented to  Procedure(s): ICD IMPLANT (N/A) as a surgical intervention.  The patient's history has been reviewed, patient examined, no change in status, stable for surgery.  I have reviewed the patient's chart and labs.  Questions were answered to the patient's satisfaction.     Shalie Schremp Meredith Leeds  ICD Criteria  Current LVEF:33%. Within 12 months prior to implant: Yes   Heart failure history: Yes, Class II  Cardiomyopathy history: Yes, Ischemic Cardiomyopathy - Prior MI.  Atrial Fibrillation/Atrial Flutter: No.  Ventricular tachycardia history: No.  Cardiac arrest history: No.  History of syndromes with risk of sudden death: No.  Previous ICD: No.  Current ICD indication: Primary  PPM indication: No.  Class I or II Bradycardia indication present: No  Beta Blocker therapy for 3 or more months: Yes, prescribed.   Ace Inhibitor/ARB therapy for 3 or more months: Yes, prescribed.    I have seen Tracey Meyer is a 61 y.o. femalepre-procedural and has been referred by [facility] for consideration of ICD implant for primary prevention of sudden death.  The patient's chart has been reviewed and they meet criteria for ICD implant.  I have had a thorough discussion with the patient reviewing options.  The patient and their family (if available) have had opportunities to ask questions and have them answered. The patient and I have decided together through the [place] [employer] to implant ICD at this time.  Risks, benefits, alternatives to ICD implantation were discussed in detail with the patient today. The patient  understands that the risks  include but are not limited to bleeding, infection, pneumothorax, perforation, tamponade, vascular damage, renal failure, MI, stroke, death, inappropriate shocks, and lead dislodgement and wishes to proceed.

## 2021-12-24 NOTE — Progress Notes (Signed)
Spoke with Dr Curt Bears to clarify ancef 1gm dose-order received-MD made aware of con'td elevated BP with R UE with shoulder immobilizer in place-pt refused L UE BP due to hx of mastectomy-bilat LE BP attempted readings were unsuccessful

## 2021-12-25 ENCOUNTER — Encounter (HOSPITAL_COMMUNITY): Payer: Self-pay | Admitting: Cardiology

## 2021-12-25 ENCOUNTER — Telehealth: Payer: Self-pay

## 2021-12-25 MED FILL — Cefazolin Sodium For Inj 2 GM: INTRAMUSCULAR | Qty: 2 | Status: AC

## 2021-12-25 MED FILL — Lidocaine HCl Local Inj 1%: INTRAMUSCULAR | Qty: 60 | Status: AC

## 2021-12-25 NOTE — Telephone Encounter (Signed)
Follow-up after same day discharge: Implant date: 12/24/2021 MD: Allegra Lai, MD Device: [employer] VR Location: Right Chest   Wound check visit: 01/08/22 @ 10:05 90 day MD follow-up: 03/31/2021 @ 2:45  Remote Transmission received:TBD, patient reports she will plug monitor in this afternoon  Dressing/sling removed: Yes  Confirm OAC restart on: Yes, Will start on 12/26/2021

## 2021-12-25 NOTE — Telephone Encounter (Signed)
-----   Message from Mamie Levers, NP sent at 12/25/2021  8:48 AM EDT ----- Regarding: device same-day discharge from yesterday ICD implant yesterday with WC.  Can resume plavix tomorrow, 10/5   Thanks! [email or web address]

## 2022-01-07 ENCOUNTER — Ambulatory Visit: Payer: Medicaid Other | Attending: Cardiology | Admitting: Cardiology

## 2022-01-07 ENCOUNTER — Encounter: Payer: Self-pay | Admitting: Cardiology

## 2022-01-07 VITALS — BP 120/80 | HR 70 | Ht 63.0 in | Wt 147.0 lb

## 2022-01-07 DIAGNOSIS — I5022 Chronic systolic (congestive) heart failure: Secondary | ICD-10-CM | POA: Diagnosis not present

## 2022-01-07 DIAGNOSIS — I110 Hypertensive heart disease with heart failure: Secondary | ICD-10-CM

## 2022-01-07 DIAGNOSIS — Z95810 Presence of automatic (implantable) cardiac defibrillator: Secondary | ICD-10-CM

## 2022-01-07 DIAGNOSIS — E785 Hyperlipidemia, unspecified: Secondary | ICD-10-CM

## 2022-01-07 DIAGNOSIS — I255 Ischemic cardiomyopathy: Secondary | ICD-10-CM | POA: Diagnosis not present

## 2022-01-07 DIAGNOSIS — I2510 Atherosclerotic heart disease of native coronary artery without angina pectoris: Secondary | ICD-10-CM | POA: Diagnosis not present

## 2022-01-07 MED ORDER — ENTRESTO 49-51 MG PO TABS: 1.0000 | ORAL_TABLET | Freq: Two times a day (BID) | ORAL | 5 refills | Status: DC

## 2022-01-07 NOTE — Progress Notes (Signed)
Cardiology Office Note:    Date:  01/07/2022   ID:  Tracey Meyer, DOB 11/26/1960, MRN [id number]  PCP:  Dorna Mai, MD  Cardiologist:  Shirlee More, MD    Referring MD: Dorna Mai, MD    ASSESSMENT:    1. Cardiomyopathy, ischemic   2. Chronic systolic (congestive) heart failure ([place])   3. Coronary artery disease involving native coronary artery of native heart without angina pectoris   4. Hypertensive heart disease with chronic systolic congestive heart failure ([place])   5. Hyperlipidemia LDL goal <70   6. ICD (implantable cardioverter-defibrillator) in place    PLAN:    In order of problems listed above:  She is doing well with guideline directed therapy compliant with medications heart failure is compensated no fluid overload to optimize treatment I am going to increase her Entresto dosage to the mid range continue her loop diuretic MRA SGLT2 inhibitor and low-dose carvedilol recheck labs including renal function proBNP.  A barrier to health care is a lack of weight scale blood pressure device and I will contact social service to see if we can access these for her. Stable CAD continue medical therapy including clopidogrel and her high intensity statin We will recheck her liver profile today for toxicity and LDL for efficacy of her lipid-lowering therapy And ICD and has follow-up with EP in the next week   Next appointment: 4 months   Medication Adjustments/Labs and Tests Ordered: Current medicines are reviewed at length with the patient today.  Concerns regarding medicines are outlined above.  Orders Placed This Encounter  Procedures   CBC with Differential/Platelet   Comprehensive metabolic panel   Lipid panel   Pro b natriuretic peptide (BNP)   Meds ordered this encounter  Medications   sacubitril-valsartan (ENTRESTO) 49-51 MG    Sig: Take 1 tablet by mouth 2 (two) times daily.    Dispense:  60 tablet    Refill:  5    Chief Complaint  Patient  presents with   Hospitalization Follow-up   Follow-up   Cardiomyopathy   Congestive Heart Failure   Coronary Artery Disease  Recent ICD  History of Present Illness:    Tracey Meyer is a 61 y.o. female with a hx of CAD s/p DES to LAD 09/22/2020 CVA, with left ICA 70% stenosis HTN, HLD, HFrEF/ICM  last seen 01/28/2021.  Her ejection fraction 702 2022 reduced in range of 30 to 40% there is no ventricular thrombus.  She presented initially with STEMI anterior 09/21/2020 She is followed by vascular surgery follow-up carotid duplex 02/05/2021 showed right ICA stenosis 1 to 39% left ICA stenosis 40 to 59% less than 50% left common carotid artery stenosis.She was last seen 07/08/2021.She had an ICD placed 12/23/2021.  Compliance with diet, lifestyle and medications: Yes  She is applying for Social Security disability request a letter of disability and I told her I do not do expert witness that they would request records and they would review her case. She feels well and is not having cardiovascular symptoms of edema shortness of breath chest pain palpitation or syncope She is compliant with medications but does not have access to a scale to weigh daily or blood pressure device and I will place a social service consult She tolerates lipid-lowering without muscle pain or weakness  Echo at [facility] 09/06./2023 EF 30-35%.  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, 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   Heart failure ([place]) 11/20/2020   HFrEF (heart failure with reduced ejection fraction) ([place]) 09/27/2020   HTN (hypertension)    Hyperlipidemia    Hypertensive urgency 11/21/2020   Hypokalemia    Left-sided weakness    Myocardial infarction [facility])    Nuclear sclerotic cataract of both eyes 07/31/2015   Posterior vitreous detachment of right eye 07/31/2015   Prediabetes    S/P angioplasty with stent to mLAD 09/22/20  09/24/2020   ST elevation myocardial infarction involving left anterior descending (LAD) coronary artery ([place]) 09/22/2020   STEMI involving left anterior descending coronary artery ([place]) 09/22/2020   Stroke ([place])    Tobacco abuse 09/24/2020   Transaminitis 09/24/2020    Past Surgical History:  Procedure Laterality Date   BUBBLE STUDY  10/02/2020   Procedure: BUBBLE STUDY;  Surgeon: Lelon Perla, MD;  Location: [place];  Service: Cardiovascular;;   CORONARY/GRAFT ACUTE MI REVASCULARIZATION N/A 09/22/2020   Procedure: Coronary/Graft Acute MI Revascularization;  Surgeon: Martinique, Peter M, MD;  Location: [place] CV LAB;  Service: Cardiovascular;  Laterality: N/A;   ICD IMPLANT N/A 12/24/2021   Procedure: ICD IMPLANT;  Surgeon: Constance Haw, MD;  Location: [place] CV LAB;  Service: Cardiovascular;  Laterality: N/A;   LEFT HEART CATH AND CORONARY ANGIOGRAPHY N/A 09/22/2020   Procedure: LEFT HEART CATH AND CORONARY ANGIOGRAPHY;  Surgeon: Martinique, Peter M, MD;  Location: [place] CV LAB;  Service: Cardiovascular;  Laterality: N/A;   mastectomy Left    at age 53 due to concerns of cancer   TEE WITHOUT CARDIOVERSION N/A 10/02/2020   Procedure: TRANSESOPHAGEAL ECHOCARDIOGRAM (TEE);  Surgeon: Lelon Perla, MD;  Location: [facility] ENDOSCOPY;   Service: Cardiovascular;  Laterality: N/A;    Current Medications: Current Meds  Medication Sig   acetaminophen (TYLENOL) 325 MG tablet Take 2 tablets (650 mg total) by mouth every 4 (four) hours as needed for headache or mild pain.   aspirin EC 81 MG tablet Take 81 mg by mouth daily. Swallow whole.   carvedilol (COREG) 3.125 MG tablet Take 1 tablet (3.125 mg total) by mouth 2 (two) times daily with a meal.   clopidogrel (PLAVIX) 75 MG tablet Take 1 tablet (75 mg total) by mouth daily.   dapagliflozin propanediol (FARXIGA) 10 MG TABS tablet Take 1 tablet (10 mg total) by mouth daily.   furosemide (LASIX) 40 MG tablet Take 1 tablet (40 mg total) by mouth daily.   nitroGLYCERIN (NITROSTAT) 0.4 MG SL tablet Place 1 tablet (0.4 mg total) under the tongue every 5 (five) minutes x 3 doses as needed for chest pain.   rosuvastatin (CRESTOR) 40 MG tablet Take 1 tablet (40 mg total) by mouth daily.   sacubitril-valsartan (ENTRESTO) 49-51 MG Take 1 tablet by mouth 2 (two) times daily.   spironolactone (  ALDACTONE) 25 MG tablet Take 0.5 tablets (12.5 mg total) by mouth daily.   [DISCONTINUED] sacubitril-valsartan (ENTRESTO) 49-51 MG Take 1 tablet by mouth 2 (two) times daily.     Allergies:   Patient has no known allergies.   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    Comment: pending disability as of 11/21/20  Tobacco Use   Smoking status: Former    Packs/day: 0.50    Years: 45.00    Total pack years: 22.50    Types: Cigarettes    Quit date: 12/06/2021    Years since quitting: 0.0    Passive exposure: Past   Smokeless tobacco: Never   Tobacco comments:    Pt states she puffs on cigarettes but don't inhale  Vaping Use   Vaping Use: Never used  Substance and Sexual Activity    Alcohol use: Not Currently   Drug use: Never   Sexual activity: Not on file  Other Topics Concern   Not on file  Social History Narrative   Not on file   Social Determinants of Health   Financial Resource Strain: High Risk (11/30/2020)   Overall Financial Resource Strain (CARDIA)    Difficulty of Paying Living Expenses: Very hard  Food Insecurity: Food Insecurity Present (11/30/2020)   Hunger Vital Sign    Worried About Running Out of Food in the Last Year: Sometimes true    Ran Out of Food in the Last Year: Sometimes true  Transportation Needs: Unmet Transportation Needs (11/30/2020)   PRAPARE - Hydrologist (Medical): Yes    Lack of Transportation (Non-Medical): Yes  Physical Activity: Not on file  Stress: Not on file  Social Connections: Not on file     Family History: The patient's family history includes Cancer in her maternal aunt and mother; Heart disease in her brother; Lupus in her sister. ROS:   Please see the history of present illness.    All other systems reviewed and are negative.  EKGs/Labs/Other Studies Reviewed:    The following studies were reviewed today:  EKG:  EKG performed 12/23/2021 by EP showed sinus rhythm left atrial enlargement ischemic T waves  Recent Labs: 07/08/2021: ALT 21 12/23/2021: BUN 26; Creatinine, Ser 1.39; Hemoglobin 11.7; Platelets 362; Potassium 4.4; Sodium 138  Recent Lipid Panel    Component Value Date/Time   CHOL 149 07/08/2021 1213   TRIG 67 07/08/2021 1213   HDL 63 07/08/2021 1213   CHOLHDL 2.4 07/08/2021 1213   CHOLHDL 5.2 09/28/2020 0404   VLDL 20 09/28/2020 0404   LDLCALC 73 07/08/2021 1213    Physical Exam:    VS:  BP 120/80 (BP Location: Right Arm, Patient Position: Sitting, Cuff Size: Normal)   Pulse 70   Ht '5\' 3"'$  (1.6 m)   Wt 147 lb (66.7 kg)   SpO2 99%   BMI 26.04 kg/m     Wt Readings from Last 3 Encounters:  01/07/22 147 lb (66.7 kg)  12/24/21 145 lb (65.8 kg)  12/23/21 145  lb 6.4 oz (66 kg)     GEN:  Well nourished, well developed in no acute distress HEENT: Normal NECK: No JVD; No carotid bruits LYMPHATICS: No lymphadenopathy CARDIAC: RRR, no murmurs, rubs, gallops  RESPIRATORY:  Clear to auscultation without rales, wheezing or rhonchi  ABDOMEN: Soft, non-tender, non-distended MUSCULOSKELETAL:  No edema; No deformity  SKIN: Warm and dry NEUROLOGIC:  Alert and oriented x 3 PSYCHIATRIC:  Normal affect    Signed, Shirlee More, MD  01/07/2022 5:00 PM    [place]

## 2022-01-07 NOTE — Progress Notes (Unsigned)
Cardiology Office Note Date:  01/07/2022  Patient ID:  Tracey, Meyer 10-14-1960, MRN [id number] PCP:  Dorna Mai, MD  Cardiologist:  Dr. Bettina Gavia Electrophysiologist: Dr. Curt Bears  ***refresh   Chief Complaint: *** wound check  History of Present Illness: Tracey Meyer is a 61 y.o. female with history of CAD (s/p STEMI, DES to LAD 09/22/2020), CVA (w/left ICA 70% stenosis, follows with vascular services), HTN, HLD, ICM, chronic CHF  Referred to Dr. 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   Cancer ([place])    Cardiomyopathy, ischemic 09/24/2020   Chronic combined systolic and diastolic congestive heart failure (HCC)    CKD (chronic kidney disease), stage III ([place]) 09/29/2020   Cortical senile cataract of right eye 07/31/2015   DM (diabetes mellitus), type 2 ([place]) 96/28/3662   Embolic stroke ([place]) 9/47/6546   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, 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   Heart failure ([place]) 11/20/2020   HFrEF (heart failure with reduced ejection fraction) ([place]) 09/27/2020   HTN (hypertension)    Hyperlipidemia    Hypertensive urgency 11/21/2020   Hypokalemia    Left-sided weakness    Myocardial infarction [facility])    Nuclear sclerotic cataract of both eyes 07/31/2015   Posterior vitreous detachment of right eye 07/31/2015   Prediabetes    S/P angioplasty with stent to mLAD 09/22/20  09/24/2020   ST elevation myocardial infarction  involving left anterior descending (LAD) coronary artery ([place]) 09/22/2020   STEMI involving left anterior descending coronary artery ([place]) 09/22/2020   Stroke ([place])    Tobacco abuse 09/24/2020   Transaminitis 09/24/2020    Past Surgical History:  Procedure Laterality Date   BUBBLE STUDY  10/02/2020   Procedure: BUBBLE STUDY;  Surgeon: Lelon Perla, MD;  Location: [place];  Service: Cardiovascular;;   CORONARY/GRAFT ACUTE MI REVASCULARIZATION N/A 09/22/2020   Procedure: Coronary/Graft Acute MI Revascularization;  Surgeon: Martinique, Peter M, MD;  Location: [place] CV LAB;  Service: Cardiovascular;  Laterality: N/A;   ICD IMPLANT N/A 12/24/2021   Procedure: ICD IMPLANT;  Surgeon: Constance Haw, MD;  Location: [place] CV LAB;  Service: Cardiovascular;  Laterality: N/A;   LEFT HEART CATH AND CORONARY ANGIOGRAPHY N/A 09/22/2020   Procedure: LEFT HEART CATH AND CORONARY ANGIOGRAPHY;  Surgeon: Martinique, Peter M, MD;  Location: [place] CV LAB;  Service: Cardiovascular;  Laterality: N/A;   mastectomy Left    at age 17 due to concerns of cancer   TEE WITHOUT CARDIOVERSION N/A 10/02/2020   Procedure: TRANSESOPHAGEAL ECHOCARDIOGRAM (TEE);  Surgeon: Lelon Perla, MD;  Location: [facility] ENDOSCOPY;  Service: Cardiovascular;  Laterality: N/A;    Current Outpatient Medications  Medication Sig Dispense Refill   acetaminophen (TYLENOL) 325 MG tablet Take 2 tablets (650 mg total) by mouth every 4 (four) hours as needed for headache or mild pain.     baclofen (LIORESAL) 10 MG tablet Take 1 tablet (10 mg total) by mouth  2 (two) times daily as needed for muscle spasms. (Patient not taking: Reported on 07/08/2021) 30 each 5   carvedilol (COREG) 3.125 MG tablet Take 1 tablet (3.125 mg total) by mouth 2 (two) times daily with a meal. 180 tablet 3   clopidogrel (PLAVIX) 75 MG tablet Take 1 tablet (75 mg total) by mouth daily. 90 tablet 3   dapagliflozin propanediol (FARXIGA) 10 MG TABS tablet  Take 1 tablet (10 mg total) by mouth daily. 90 tablet 3   furosemide (LASIX) 40 MG tablet Take 1 tablet (40 mg total) by mouth daily. 90 tablet 3   nitroGLYCERIN (NITROSTAT) 0.4 MG SL tablet Place 1 tablet (0.4 mg total) under the tongue every 5 (five) minutes x 3 doses as needed for chest pain. 25 tablet 6   rosuvastatin (CRESTOR) 40 MG tablet Take 1 tablet (40 mg total) by mouth daily. 90 tablet 3   sacubitril-valsartan (ENTRESTO) 49-51 MG Take 1 tablet by mouth 2 (two) times daily. 60 tablet 5   spironolactone (ALDACTONE) 25 MG tablet Take 0.5 tablets (12.5 mg total) by mouth daily. 45 tablet 5   No current facility-administered medications for this visit.    Allergies:   Patient has no known allergies.   Social History:  The patient  reports that she quit smoking about 4 weeks ago. Her smoking use included cigarettes. She has a 22.50 pack-year smoking history. She has been exposed to tobacco smoke. She has never used smokeless tobacco. She reports that she does not currently use alcohol. She reports that she does not use drugs.   Family History:  The patient's family history includes Cancer in her maternal aunt and mother; Heart disease in her brother; Lupus in her sister.***  ROS:  Please see the history of present illness.    All other systems are reviewed and otherwise negative.   PHYSICAL EXAM:  VS:  There were no vitals taken for this visit. BMI: There is no height or weight on file to calculate BMI. Well nourished, well developed, in no acute distress HEENT: normocephalic, atraumatic Neck: no JVD, carotid bruits or masses Cardiac:  *** RRR; no significant murmurs, no rubs, or gallops Lungs:  *** CTA b/l, no wheezing, rhonchi or rales Abd: soft, nontender MS: no deformity or *** atrophy Ext: *** no edema Skin: warm and dry, no rash Neuro:  No gross deficits appreciated Psych: euthymic mood, full affect  *** ICD site is stable, no tethering or discomfort   EKG:  Done today  and reviewed by myself shows  ***  Device interrogation done today and reviewed by myself:  ***  12/07/21: TTE LVEF 30-35% Marked anteroseptal hypokinesis > akinesis  08/25/21: TTE 1. Akinetic anteroseptum. Left ventricular ejection fraction, by  estimation, is 30 to 35%. The left ventricle has moderately decreased  function. The left ventricle demonstrates regional wall motion  abnormalities.   2. Right ventricular systolic function is normal. The right ventricular  size is normal. There is normal pulmonary artery systolic pressure.   3. Left atrial size was mildly dilated.   4. The mitral valve is normal in structure. Moderate mitral valve  regurgitation. No evidence of mitral stenosis.   5. The aortic valve is normal in structure. Aortic valve regurgitation is  not visualized. Aortic valve sclerosis/calcification is present, without  any evidence of aortic stenosis.   6. The inferior vena cava is normal in size with greater than 50%  respiratory variability, suggesting right atrial pressure of 3 mmHg.  09/22/2020: LHC/PCI RPDA lesion is 50% stenosed. Dist Cx lesion is 30% stenosed with 30% stenosed side branch in LPAV. 1st Diag lesion is 60% stenosed. Mid LAD lesion is 100% stenosed. Post intervention, there is a 0% residual stenosis. A drug-eluting stent was successfully placed using a STENT ONYX FRONTIER 2.5X38. LV end diastolic pressure is mildly elevated.   1. 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 Mildly elevated LVEDP 21 mmHg 3. Successful PCI of the mid LAD with DES x 1  Recent Labs: 07/08/2021: ALT 21 12/23/2021: BUN 26; Creatinine, Ser 1.39; Hemoglobin 11.7; Platelets 362; Potassium 4.4; Sodium 138  07/08/2021: Chol/HDL Ratio 2.4; Cholesterol, Total 149; HDL 63; LDL Chol Calc (NIH) 73; Triglycerides 67   Estimated Creatinine Clearance: 38.8 mL/min (A) (by C-G formula based on SCr of 1.39 mg/dL (H)).   Wt Readings from Last 3 Encounters:   12/24/21 145 lb (65.8 kg)  12/23/21 145 lb 6.4 oz (66 kg)  07/08/21 150 lb (68 kg)     Other studies reviewed: Additional studies/records reviewed today include: summarized above  ASSESSMENT AND PLAN:  ICD ***  CAD ***  ICM Chronic CHF ***  Disposition: F/u with ***  Current medicines are reviewed at length with the patient today.  The patient did not have any concerns regarding medicines.  Venetia Night, PA-C 01/07/2022 7:36 AM     [place] [address] [phone] (office)  [phone] (fax)

## 2022-01-07 NOTE — Patient Instructions (Addendum)
Medication Instructions:  Your physician has recommended you make the following change in your medication:  Increase Entresto to 49/51 mg two times daily  *If you need a refill on your cardiac medications before your next appointment, please call your pharmacy*   Lab Work:  Your physician recommends that you return for lab work in: Today for CBC, CMP, LIPID Panel and ProBnp    Follow-Up: At [facility], you and your health needs are our priority.  As part of our continuing mission to provide you with exceptional heart care, we have created designated Provider Care Teams.  These Care Teams include your primary Cardiologist (physician) and Advanced Practice Providers (APPs -  Physician Assistants and Nurse Practitioners) who all work together to provide you with the care you need, when you need it.  We recommend signing up for the patient portal called "MyChart".  Sign up information is provided on this After Visit Summary.  MyChart is used to connect with patients for Virtual Visits (Telemedicine).  Patients are able to view lab/test results, encounter notes, upcoming appointments, etc.  Non-urgent messages can be sent to your provider as well.   To learn more about what you can do with MyChart, go to [email or web address].    Your next appointment:   4 month(s)  The format for your next appointment:   In Person  Provider:   Shirlee More, MD    Other Instructions Take BP and Weight at home/ Gave pt scale due to lack of funds. Pt is applying for disability  Important Information About [place]

## 2022-01-08 ENCOUNTER — Encounter: Payer: Self-pay | Admitting: Physician Assistant

## 2022-01-08 ENCOUNTER — Telehealth: Payer: Self-pay

## 2022-01-08 ENCOUNTER — Ambulatory Visit: Payer: Medicaid Other | Attending: Physician Assistant | Admitting: Physician Assistant

## 2022-01-08 VITALS — BP 124/68 | HR 73 | Ht 62.0 in | Wt 145.4 lb

## 2022-01-08 DIAGNOSIS — I2510 Atherosclerotic heart disease of native coronary artery without angina pectoris: Secondary | ICD-10-CM | POA: Diagnosis not present

## 2022-01-08 DIAGNOSIS — I255 Ischemic cardiomyopathy: Secondary | ICD-10-CM

## 2022-01-08 DIAGNOSIS — I5022 Chronic systolic (congestive) heart failure: Secondary | ICD-10-CM | POA: Diagnosis not present

## 2022-01-08 DIAGNOSIS — Z95810 Presence of automatic (implantable) cardiac defibrillator: Secondary | ICD-10-CM

## 2022-01-08 DIAGNOSIS — I25118 Atherosclerotic heart disease of native coronary artery with other forms of angina pectoris: Secondary | ICD-10-CM

## 2022-01-08 LAB — CUP PACEART INCLINIC DEVICE CHECK
Battery Remaining Longevity: 123 mo
Brady Statistic RV Percent Paced: 0 %
Date Time Interrogation Session: 20231018111408
HighPow Impedance: 60.75 Ohm
Implantable Lead Implant Date: 20231003
Implantable Lead Location: 753860
Implantable Pulse Generator Implant Date: 20231003
Lead Channel Impedance Value: 587.5 Ohm
Lead Channel Pacing Threshold Amplitude: 0.5 V
Lead Channel Pacing Threshold Amplitude: 0.5 V
Lead Channel Pacing Threshold Pulse Width: 0.5 ms
Lead Channel Pacing Threshold Pulse Width: 0.5 ms
Lead Channel Sensing Intrinsic Amplitude: 11.8 mV
Lead Channel Setting Pacing Amplitude: 3.5 V
Lead Channel Setting Pacing Pulse Width: 0.5 ms
Lead Channel Setting Sensing Sensitivity: 0.5 mV
Pulse Gen Serial Number: 211008330

## 2022-01-08 LAB — COMPREHENSIVE METABOLIC PANEL
ALT: 8 IU/L (ref 0–32)
AST: 12 IU/L (ref 0–40)
Albumin/Globulin Ratio: 1.3 (ref 1.2–2.2)
Albumin: 4.3 g/dL (ref 3.9–4.9)
Alkaline Phosphatase: 85 IU/L (ref 44–121)
BUN/Creatinine Ratio: 20 (ref 12–28)
BUN: 36 mg/dL — ABNORMAL HIGH (ref 8–27)
Bilirubin Total: <0.2 mg/dL (ref 0.0–1.2)
CO2: 20 mmol/L (ref 20–29)
Calcium: 10 mg/dL (ref 8.7–10.3)
Chloride: 102 mmol/L (ref 96–106)
Creatinine, Ser: 1.76 mg/dL — ABNORMAL HIGH (ref 0.57–1.00)
Globulin, Total: 3.4 g/dL (ref 1.5–4.5)
Glucose: 118 mg/dL — ABNORMAL HIGH (ref 70–99)
Potassium: 4.4 mmol/L (ref 3.5–5.2)
Sodium: 139 mmol/L (ref 134–144)
Total Protein: 7.7 g/dL (ref 6.0–8.5)
eGFR: 33 mL/min/{1.73_m2} — ABNORMAL LOW (ref >59)

## 2022-01-08 LAB — CBC WITH DIFFERENTIAL/PLATELET
Basophils Absolute: 0 x10E3/uL (ref 0.0–0.2)
Basos: 1 %
EOS (ABSOLUTE): 0.2 x10E3/uL (ref 0.0–0.4)
Eos: 5 %
Hematocrit: 38.7 % (ref 34.0–46.6)
Hemoglobin: 12.4 g/dL (ref 11.1–15.9)
Immature Grans (Abs): 0 x10E3/uL (ref 0.0–0.1)
Immature Granulocytes: 0 %
Lymphocytes Absolute: 1.9 x10E3/uL (ref 0.7–3.1)
Lymphs: 42 %
MCH: 28.9 pg (ref 26.6–33.0)
MCHC: 32 g/dL (ref 31.5–35.7)
MCV: 90 fL (ref 79–97)
Monocytes Absolute: 0.4 x10E3/uL (ref 0.1–0.9)
Monocytes: 10 %
Neutrophils Absolute: 1.9 x10E3/uL (ref 1.4–7.0)
Neutrophils: 42 %
Platelets: 275 x10E3/uL (ref 150–450)
RBC: 4.29 x10E6/uL (ref 3.77–5.28)
RDW: 14.3 % (ref 11.7–15.4)
WBC: 4.5 x10E3/uL (ref 3.4–10.8)

## 2022-01-08 LAB — PRO B NATRIURETIC PEPTIDE: NT-Pro BNP: 3820 pg/mL — ABNORMAL HIGH (ref 0–287)

## 2022-01-08 LAB — LIPID PANEL
Chol/HDL Ratio: 5.5 ratio — ABNORMAL HIGH (ref 0.0–4.4)
Cholesterol, Total: 268 mg/dL — ABNORMAL HIGH (ref 100–199)
HDL: 49 mg/dL
LDL Chol Calc (NIH): 191 mg/dL — ABNORMAL HIGH (ref 0–99)
Triglycerides: 152 mg/dL — ABNORMAL HIGH (ref 0–149)
VLDL Cholesterol Cal: 28 mg/dL (ref 5–40)

## 2022-01-08 MED ORDER — ROSUVASTATIN CALCIUM 40 MG PO TABS: 40.0000 mg | ORAL_TABLET | Freq: Every day | ORAL | 3 refills | Status: DC

## 2022-01-08 MED ORDER — FUROSEMIDE 40 MG PO TABS: 40.0000 mg | ORAL_TABLET | ORAL | 3 refills | Status: DC

## 2022-01-08 NOTE — Patient Instructions (Signed)
Medication Instructions:   Your physician recommends that you continue on your current medications as directed. Please refer to the Current Medication list given to you today.  *If you need a refill on your cardiac medications before your next appointment, please call your pharmacy*   Lab Work:  [place]   If you have labs (blood work) drawn today and your tests are completely normal, you will receive your results only by: [place] (if you have MyChart) OR A paper copy in the mail If you have any lab test that is abnormal or we need to change your treatment, we will call you to review the results.   Testing/Procedures: NONE ORDERED  TODAY    Follow-Up: At [facility], you and your health needs are our priority.  As part of our continuing mission to provide you with exceptional heart care, we have created designated Provider Care Teams.  These Care Teams include your primary Cardiologist (physician) and Advanced Practice Providers (APPs -  Physician Assistants and Nurse Practitioners) who all work together to provide you with the care you need, when you need it.  We recommend signing up for the patient portal called "MyChart".  Sign up information is provided on this After Visit Summary.  MyChart is used to connect with patients for Virtual Visits (Telemedicine).  Patients are able to view lab/test results, encounter notes, upcoming appointments, etc.  Non-urgent messages can be sent to your provider as well.   To learn more about what you can do with MyChart, go to [email or web address].    Your next appointment:   AS SCHEDULED   The format for your next appointment:   In Person  Provider:   You may see Will Meredith Leeds, MD   Other Instructions   Important Information About [place]

## 2022-01-08 NOTE — Telephone Encounter (Signed)
Spoke with Tracey Meyer. She is not taking the Rosuvastatin. She stated that she would start the medication. Advised per Dr. Joya Gaskins note to decrease Lasix to Monday, Wednesday and Friday. Tracey Meyer agreed and verbalized understanding.

## 2022-02-17 ENCOUNTER — Telehealth: Payer: Self-pay | Admitting: Cardiology

## 2022-02-17 NOTE — Telephone Encounter (Signed)
Pt PCP office called asking if they can have recent OV faxed over to them    Fax : [phone]

## 2022-02-17 NOTE — Telephone Encounter (Signed)
Last office visit faxed to PCP .

## 2022-03-26 ENCOUNTER — Ambulatory Visit (INDEPENDENT_AMBULATORY_CARE_PROVIDER_SITE_OTHER): Payer: Medicaid Other

## 2022-03-26 DIAGNOSIS — I255 Ischemic cardiomyopathy: Secondary | ICD-10-CM | POA: Diagnosis not present

## 2022-03-27 LAB — CUP PACEART REMOTE DEVICE CHECK
Battery Remaining Longevity: 113 mo
Battery Remaining Percentage: 95.5 %
Battery Voltage: 3.1 V
Brady Statistic RV Percent Paced: 1 %
Date Time Interrogation Session: 20240104020201
HighPow Impedance: 68 Ohm
Implantable Lead Connection Status: 753985
Implantable Lead Implant Date: 20231003
Implantable Lead Location: 753860
Implantable Pulse Generator Implant Date: 20231003
Lead Channel Impedance Value: 590 Ohm
Lead Channel Pacing Threshold Amplitude: 0.5 V
Lead Channel Pacing Threshold Pulse Width: 0.5 ms
Lead Channel Sensing Intrinsic Amplitude: 11.8 mV
Lead Channel Setting Pacing Amplitude: 3.5 V
Lead Channel Setting Pacing Pulse Width: 0.5 ms
Lead Channel Setting Sensing Sensitivity: 0.5 mV
Pulse Gen Serial Number: 211008330
Zone Setting Status: 755011

## 2022-03-31 ENCOUNTER — Ambulatory Visit: Payer: Medicaid Other | Attending: Cardiology | Admitting: Cardiology

## 2022-03-31 ENCOUNTER — Encounter: Payer: Self-pay | Admitting: Cardiology

## 2022-03-31 VITALS — BP 167/91 | HR 75 | Ht 64.0 in | Wt 153.0 lb

## 2022-03-31 DIAGNOSIS — I2510 Atherosclerotic heart disease of native coronary artery without angina pectoris: Secondary | ICD-10-CM | POA: Diagnosis not present

## 2022-03-31 DIAGNOSIS — I5042 Chronic combined systolic (congestive) and diastolic (congestive) heart failure: Secondary | ICD-10-CM

## 2022-03-31 DIAGNOSIS — I10 Essential (primary) hypertension: Secondary | ICD-10-CM | POA: Diagnosis not present

## 2022-03-31 LAB — CUP PACEART INCLINIC DEVICE CHECK
Battery Remaining Longevity: 122 mo
Brady Statistic RV Percent Paced: 0 %
Date Time Interrogation Session: 20240108165406
HighPow Impedance: 70.875
Implantable Lead Connection Status: 753985
Implantable Lead Implant Date: 20231003
Implantable Lead Location: 753860
Implantable Pulse Generator Implant Date: 20231003
Lead Channel Impedance Value: 587.5 Ohm
Lead Channel Pacing Threshold Amplitude: 0.5 V
Lead Channel Pacing Threshold Amplitude: 0.5 V
Lead Channel Pacing Threshold Pulse Width: 0.5 ms
Lead Channel Pacing Threshold Pulse Width: 0.5 ms
Lead Channel Sensing Intrinsic Amplitude: 11.8 mV
Lead Channel Setting Pacing Amplitude: 2.5 V
Lead Channel Setting Pacing Pulse Width: 0.5 ms
Lead Channel Setting Sensing Sensitivity: 0.5 mV
Pulse Gen Serial Number: 211008330
Zone Setting Status: 755011

## 2022-03-31 NOTE — Patient Instructions (Signed)
Medication Instructions:  Your physician recommends that you continue on your current medications as directed. Please refer to the Current Medication list given to you today.  *If you need a refill on your cardiac medications before your next appointment, please call your pharmacy*   Lab Work: None ordered   Testing/Procedures: None ordered   Follow-Up: At [facility], you and your health needs are our priority.  As part of our continuing mission to provide you with exceptional heart care, we have created designated Provider Care Teams.  These Care Teams include your primary Cardiologist (physician) and Advanced Practice Providers (APPs -  Physician Assistants and Nurse Practitioners) who all work together to provide you with the care you need, when you need it.  We recommend signing up for the patient portal called "MyChart".  Sign up information is provided on this After Visit Summary.  MyChart is used to connect with patients for Virtual Visits (Telemedicine).  Patients are able to view lab/test results, encounter notes, upcoming appointments, etc.  Non-urgent messages can be sent to your provider as well.   To learn more about what you can do with MyChart, go to [email or web address].    Remote monitoring is used to monitor your Pacemaker or ICD from home. This monitoring reduces the number of office visits required to check your device to one time per year. It allows [place] to keep an eye on the functioning of your device to ensure it is working properly. You are scheduled for a device check from home on 06/25/2022. You may send your transmission at any time that day. If you have a wireless device, the transmission will be sent automatically. After your physician reviews your transmission, you will receive a postcard with your next transmission date.  Your next appointment:   1 year(s)  The format for your next appointment:   In Person  Provider:   Allegra Lai, MD    Thank you  for choosing [facility]!!   Trinidad Curet, RN [phone]    Other Instructions  Important Information About Sugar

## 2022-03-31 NOTE — Progress Notes (Signed)
Electrophysiology Office Note   Date:  03/31/2022   ID:  Tracey Meyer, DOB [birth year], MRN [id number]  PCP:  Gala Lewandowsky, MD  Cardiologist:  Bettina Gavia Primary Electrophysiologist:  Kirklin Mcduffee Meredith Leeds, MD    Chief Complaint: CHF   History of Present Illness: Tracey Meyer is a 62 y.o. female who is being seen today for the evaluation of CHF at the request of Dorna Mai, MD. Presenting today for electrophysiology evaluation.  She has a history significant for coronary artery disease status post DES to the LAD in 2022, CVA, left-sided a 70% ICA stenosis, hypertension, hyperlipidemia, chronic systolic heart failure due to ischemic cardiomyopathy.  She is currently off medical therapy for heart failure.  She is status post Abbott ICD implanted 12/24/2021.  Today, denies symptoms of palpitations, chest pain, shortness of breath, orthopnea, PND, lower extremity edema, claudication, dizziness, presyncope, syncope, bleeding, or neurologic sequela. The patient is tolerating medications without difficulties.     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   Cancer ([place])    Cardiomyopathy, ischemic 09/24/2020   Chronic combined systolic and diastolic congestive heart failure (HCC)    CKD (chronic kidney disease), stage III ([place]) 09/29/2020   Cortical senile cataract of right eye 07/31/2015   DM (diabetes mellitus), type 2 ([place]) 40/98/1191   Embolic stroke ([place]) 4/78/2956   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, 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   Heart failure ([place]) 11/20/2020   HFrEF (heart failure with reduced ejection fraction) ([place]) 09/27/2020   HTN  (hypertension)    Hyperlipidemia    Hypertensive urgency 11/21/2020   Hypokalemia    Left-sided weakness    Myocardial infarction [facility])    Nuclear sclerotic cataract of both eyes 07/31/2015   Posterior vitreous detachment of right eye 07/31/2015   Prediabetes    S/P angioplasty with stent to mLAD 09/22/20  09/24/2020   ST elevation myocardial infarction involving left anterior descending (LAD) coronary artery ([place]) 09/22/2020   STEMI involving left anterior descending coronary artery ([place]) 09/22/2020   Stroke ([place])    Tobacco abuse 09/24/2020   Transaminitis 09/24/2020   Past Surgical History:  Procedure Laterality Date   BUBBLE STUDY  10/02/2020   Procedure: BUBBLE STUDY;  Surgeon: Lelon Perla, MD;  Location: [facility];  Service: Cardiovascular;;   CORONARY/GRAFT ACUTE MI REVASCULARIZATION N/A 09/22/2020   Procedure: Coronary/Graft Acute MI Revascularization;  Surgeon: Martinique, Peter M, MD;  Location: [facility] CV LAB;  Service: Cardiovascular;  Laterality: N/A;   ICD IMPLANT N/A 12/24/2021   Procedure: ICD IMPLANT;  Surgeon: Constance Haw, MD;  Location: [facility] CV LAB;  Service: Cardiovascular;  Laterality: N/A;   LEFT HEART CATH AND CORONARY ANGIOGRAPHY N/A 09/22/2020   Procedure: LEFT HEART CATH AND CORONARY ANGIOGRAPHY;  Surgeon: Martinique, Peter M, MD;  Location: [facility] CV LAB;  Service: Cardiovascular;  Laterality: N/A;   mastectomy Left    at age 50 due to concerns of cancer   TEE WITHOUT CARDIOVERSION N/A 10/02/2020   Procedure: TRANSESOPHAGEAL ECHOCARDIOGRAM (TEE);  Surgeon: Lelon Perla, MD;  Location[facility] ENDOSCOPY;  Service: Cardiovascular;  Laterality: N/A;     Current Outpatient  Medications  Medication Sig Dispense Refill   acetaminophen (TYLENOL) 325 MG tablet Take 2 tablets (650 mg total) by mouth every 4 (four) hours as needed for headache or mild pain.     aspirin EC 81 MG tablet Take 81 mg by mouth daily. Swallow whole.     carvedilol (COREG)  3.125 MG tablet Take 1 tablet (3.125 mg total) by mouth 2 (two) times daily with a meal. 180 tablet 3   clopidogrel (PLAVIX) 75 MG tablet Take 1 tablet (75 mg total) by mouth daily. 90 tablet 3   dapagliflozin propanediol (FARXIGA) 10 MG TABS tablet Take 1 tablet (10 mg total) by mouth daily. 90 tablet 3   furosemide (LASIX) 40 MG tablet Take 1 tablet (40 mg total) by mouth 3 (three) times a week. M-W-F 90 tablet 3   nitroGLYCERIN (NITROSTAT) 0.4 MG SL tablet Place 1 tablet (0.4 mg total) under the tongue every 5 (five) minutes x 3 doses as needed for chest pain. 25 tablet 6   rosuvastatin (CRESTOR) 40 MG tablet Take 1 tablet (40 mg total) by mouth daily. 90 tablet 3   sacubitril-valsartan (ENTRESTO) 49-51 MG Take 1 tablet by mouth 2 (two) times daily. 60 tablet 5   spironolactone (ALDACTONE) 25 MG tablet Take 0.5 tablets (12.5 mg total) by mouth daily. 45 tablet 5   No current facility-administered medications for this visit.    Allergies:   Patient has no known allergies.   Social History:  The patient  reports that she quit smoking about 3 months ago. Her smoking use included cigarettes. She has a 22.50 pack-year smoking history. She has been exposed to tobacco smoke. She has never used smokeless tobacco. She reports that she does not currently use alcohol. She reports that she does not use drugs.   Family History:  The patient's family history includes Cancer in her maternal aunt and mother; Heart disease in her brother; Lupus in her sister.   ROS:  Please see the history of present illness.   Otherwise, review of systems is positive for none.   All other systems are reviewed and negative.   PHYSICAL EXAM: VS:  BP (!) 167/91   Pulse 75   Ht '5\' 4"'$  (1.626 m)   Wt 153 lb (69.4 kg)   SpO2 98%   BMI 26.26 kg/m  , BMI Body mass index is 26.26 kg/m. GEN: Well nourished, well developed, in no acute distress  HEENT: normal  Neck: no JVD, carotid bruits, or masses Cardiac: RRR; no murmurs,  rubs, or gallops,no edema  Respiratory:  clear to auscultation bilaterally, normal work of breathing GI: soft, nontender, nondistended, + BS MS: no deformity or atrophy  Skin: warm and dry, device site well healed Neuro:  Strength and sensation are intact Psych: euthymic mood, full affect  EKG:  EKG is ordered today. Personal review of the ekg ordered  shows sinus rhythm, left atrial enlargement, rate 75, lateral T wave inversions present  Personal review of the device interrogation today. Results in [facility]: 01/07/2022: ALT 8; BUN 36; Creatinine, Ser 1.76; Hemoglobin 12.4; NT-Pro BNP 3,820; Platelets 275; Potassium 4.4; Sodium 139    Lipid Panel     Component Value Date/Time   CHOL 268 (H) 01/07/2022 1620   TRIG 152 (H) 01/07/2022 1620   HDL 49 01/07/2022 1620   CHOLHDL 5.5 (H) 01/07/2022 1620   CHOLHDL 5.2 09/28/2020 0404   VLDL 20 09/28/2020 0404   LDLCALC 191 (H) 01/07/2022 1620  Wt Readings from Last 3 Encounters:  03/31/22 153 lb (69.4 kg)  01/08/22 145 lb 6.4 oz (66 kg)  01/07/22 147 lb (66.7 kg)      Other studies Reviewed: Additional studies/ records that were reviewed today include: TTE 12/07/21  Review of the above records today demonstrates:  Ejection fraction 30 to 35% Anteroseptal hypokinesis to akinesis Moderately dilated left atrium No significant valvular abnormality   ASSESSMENT AND PLAN:  1.  Coronary disease: Currently on aspirin and Plavix.  No current chest pain.  Plan per primary cardiology.  2.  Chronic systolic heart failure: Due to ischemic cardiomyopathy.  Ejection fraction 30 to 35%.  Currently off medical therapy with carvedilol, Farxiga, Entresto, Aldactone, doses above.  Is status post Abbott ICD implanted 05/24/2021.  Device functioning appropriately.  No changes at this time.  3.  Hyperlipidemia: Goal LDL less than 70.  Continue statin per primary cardiology  4.  Hypertension: + Blood pressure is elevated today.  Has  been it usually well-controlled.  Tracey Meyer continue with current management per primary cardiology.,  However   Current medicines are reviewed at length with the patient today.   The patient does not have concerns regarding her medicines.  The following changes were made today:  none  Labs/ tests ordered today include:  Orders Placed This Encounter  Procedures   EKG 12-Lead     Disposition:   FU 12 months  Signed, Abagayle Klutts Meredith Leeds, MD  03/31/2022 3:26 PM     [facility] [address] [place] [address] [phone] (office) [phone] (fax)

## 2022-04-17 NOTE — Progress Notes (Signed)
Remote ICD transmission.   

## 2022-05-05 ENCOUNTER — Other Ambulatory Visit: Payer: Self-pay

## 2022-05-05 DIAGNOSIS — I6523 Occlusion and stenosis of bilateral carotid arteries: Secondary | ICD-10-CM

## 2022-05-12 ENCOUNTER — Encounter (HOSPITAL_COMMUNITY): Payer: Medicaid Other

## 2022-05-12 ENCOUNTER — Ambulatory Visit: Payer: Medicaid Other

## 2022-05-16 NOTE — Progress Notes (Deleted)
HISTORY AND PHYSICAL     CC:  follow up. Requesting Provider:  Gala Lewandowsky, MD  HPI: This is a 62 y.o. female here for follow up for carotid artery stenosis.  She was seen in consultation in the hospital in July 2022 for left sided weakness, slurred speech and found to have an acute non hemorrhagic stroke.    Neurology was consulted and feels her scattered infarcts were likely cardioembolic in nature secondary to cardiomyopathy.  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  Vascular surgery consultation is requested.  TTE revealed no LV thrombus.  Given she had  bilateral infarcts in both cerebellar and cerebral regions, it was not felt that carotid stenosis was etiology for her stroke.     Pt was last seen in our office on 02/05/2021 and at that time she did have some residual left sided weakness but was improving.  She was not having any new symptoms.  She has 1-39% right and 40-59% left ICA stenosis and was scheduled for 1 year follow up.    Pt returns today for follow up.    Pt *** any amaurosis fugax, speech difficulties, weakness, numbness, paralysis or clumsiness or facial droop.    ***  The pt is on a statin for cholesterol management.  The pt is on a daily aspirin.   Other AC:  Plavix The pt is on diuretic, ARB, BB for hypertension.   The pt does  have diabetes Tobacco hx:  former  Pt does *** have family hx of AAA.  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   Cancer ([place])    Cardiomyopathy, ischemic 09/24/2020   Chronic combined  systolic and diastolic congestive heart failure (HCC)    CKD (chronic kidney disease), stage III ([place]) 09/29/2020   Cortical senile cataract of right eye 07/31/2015   DM (diabetes mellitus), type 2 ([place]) [id number]   Embolic stroke ([place]) [id number]   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, 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   Heart failure ([place]) 11/20/2020   HFrEF (heart failure with reduced ejection fraction) ([place]) 09/27/2020   HTN (hypertension)    Hyperlipidemia    Hypertensive urgency 11/21/2020   Hypokalemia    Left-sided weakness    Myocardial infarction [facility])    Nuclear sclerotic cataract of both eyes 07/31/2015   Posterior vitreous detachment of right eye 07/31/2015   Prediabetes    S/P angioplasty with stent to mLAD 09/22/20  09/24/2020   ST elevation myocardial infarction involving left anterior descending (LAD) coronary artery ([place]) 09/22/2020   STEMI involving left anterior descending coronary artery ([place]) 09/22/2020   Stroke ([place])    Tobacco abuse 09/24/2020   Transaminitis 09/24/2020    Past Surgical History:  Procedure Laterality Date   BUBBLE STUDY  10/02/2020   Procedure: BUBBLE STUDY;  Surgeon: Lelon Perla, MD;  Location: [facility];  Service: Cardiovascular;;   CORONARY/GRAFT ACUTE MI REVASCULARIZATION N/A 09/22/2020   Procedure: Coronary/Graft Acute MI Revascularization;  Surgeon: Martinique, Peter M, MD;  Location: [facility] CV LAB;  Service: Cardiovascular;  Laterality: N/A;   ICD IMPLANT N/A 12/24/2021   Procedure: ICD IMPLANT;  Surgeon: Constance Haw, MD;  Location: [facility] CV LAB;  Service: Cardiovascular;  Laterality: N/A;   LEFT HEART CATH AND CORONARY ANGIOGRAPHY N/A 09/22/2020   Procedure: LEFT HEART CATH AND CORONARY ANGIOGRAPHY;  Surgeon: Martinique, Peter M, MD;  Location: [facility] CV LAB;  Service: Cardiovascular;  Laterality: N/A;   mastectomy Left    at age 83 due to concerns of cancer   TEE WITHOUT CARDIOVERSION N/A 10/02/2020   Procedure:  TRANSESOPHAGEAL ECHOCARDIOGRAM (TEE);  Surgeon: Lelon Perla, MD;  Location: [facility] ENDOSCOPY;  Service: Cardiovascular;  Laterality: N/A;    No Known Allergies  Current Outpatient Medications  Medication Sig Dispense Refill   acetaminophen (TYLENOL) 325 MG tablet Take 2 tablets (650 mg total) by mouth every 4 (four) hours as needed for headache or mild pain.     aspirin EC 81 MG tablet Take 81 mg by mouth daily. Swallow whole.     carvedilol (COREG) 3.125 MG tablet Take 1 tablet (3.125 mg total) by mouth 2 (two) times daily with a meal. 180 tablet 3   clopidogrel (PLAVIX) 75 MG tablet Take 1 tablet (75 mg total) by mouth daily. 90 tablet 3   dapagliflozin propanediol (FARXIGA) 10 MG TABS tablet Take 1 tablet (10 mg total) by mouth daily. 90 tablet 3   furosemide (LASIX) 40 MG tablet Take 1 tablet (40 mg total) by mouth 3 (three) times a week. M-W-F 90 tablet 3   nitroGLYCERIN (NITROSTAT) 0.4 MG SL tablet Place 1 tablet (0.4 mg total) under the tongue every 5 (five) minutes x 3 doses as needed for chest pain. 25 tablet 6   rosuvastatin (CRESTOR) 40 MG tablet Take 1 tablet (40 mg total) by mouth daily. 90 tablet 3   sacubitril-valsartan (ENTRESTO) 49-51 MG Take 1 tablet by mouth 2 (two) times daily. 60 tablet 5   spironolactone (ALDACTONE) 25 MG tablet Take 0.5 tablets (12.5 mg total) by mouth daily. 45 tablet 5   No current facility-administered medications for this visit.    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    Comment: pending disability as of 11/21/20  Tobacco Use   Smoking status: Former    Packs/day: 0.50    Years: 45.00     Total pack years: 22.50    Types: Cigarettes    Quit date: 12/06/2021    Years since quitting: 0.4    Passive exposure: Past   Smokeless tobacco: Never   Tobacco comments:    Pt states she puffs on cigarettes but don't inhale  Vaping Use   Vaping Use: Never used  Substance and Sexual Activity   Alcohol use: Not Currently   Drug use: Never   Sexual activity: Not on file  Other Topics Concern   Not on file  Social History Narrative   Not on file   Social Determinants of Health   Financial Resource Strain: High Risk (11/30/2020)   Overall Financial Resource Strain (CARDIA)  Difficulty of Paying Living Expenses: Very hard  Food Insecurity: Food Insecurity Present (11/30/2020)   Hunger Vital Sign    Worried About Running Out of Food in the Last Year: Sometimes true    Ran Out of Food in the Last Year: Sometimes true  Transportation Needs: Unmet Transportation Needs (11/30/2020)   PRAPARE - Hydrologist (Medical): Yes    Lack of Transportation (Non-Medical): Yes  Physical Activity: Not on file  Stress: Not on file  Social Connections: Not on file  Intimate Partner Violence: Not on file     REVIEW OF SYSTEMS:  *** '[X]'$  denotes positive finding, '[ ]'$  denotes negative finding Cardiac  Comments:  Chest pain or chest pressure:    Shortness of breath upon exertion:    Short of breath when lying flat:    Irregular heart rhythm:        Vascular    Pain in calf, thigh, or hip brought on by ambulation:    Pain in feet at night that wakes you up from your sleep:     Blood clot in your veins:    Leg swelling:         Pulmonary    Oxygen at home:    Productive cough:     Wheezing:         Neurologic    Sudden weakness in arms or legs:     Sudden numbness in arms or legs:     Sudden onset of difficulty speaking or slurred speech:    Temporary loss of vision in one eye:     Problems with dizziness:         Gastrointestinal    Blood in stool:      Vomited blood:         Genitourinary    Burning when urinating:     Blood in urine:        Psychiatric    Major depression:         Hematologic    Bleeding problems:    Problems with blood clotting too easily:        Skin    Rashes or ulcers:        Constitutional    Fever or chills:      PHYSICAL EXAMINATION:  ***  General:  WDWN in NAD; vital signs documented above Gait: Not observed HENT: WNL, normocephalic Pulmonary: normal non-labored breathing Cardiac: {Desc; regular/irreg:14544} HR, {With/Without:20273} carotid bruit*** Abdomen: soft, NT; aortic pulse is *** palpable Skin: {With/Without:20273} rashes Vascular Exam/Pulses:  Right Left  Radial {Exam; arterial pulse strength 0-4:30167} {Exam; arterial pulse strength 0-4:30167}  Popliteal {Exam; arterial pulse strength 0-4:30167} {Exam; arterial pulse strength 0-4:30167}  DP {Exam; arterial pulse strength 0-4:30167} {Exam; arterial pulse strength 0-4:30167}  PT {Exam; arterial pulse strength 0-4:30167} {Exam; arterial pulse strength 0-4:30167}   Extremities: {With/Without:20273} open wounds Musculoskeletal: no muscle wasting or atrophy  Neurologic: A&O X 3; moving all extremities equally; speech is fluent/normal Psychiatric:  The pt has {Desc; normal/abnormal:11317::"Normal"} affect.   Non-Invasive Vascular Imaging:   Carotid Duplex on 05/19/2022 Right:  ***% ICA stenosis Left:  ***% ICA stenosis ***  Previous Carotid duplex on 02/05/2021: Right: 1-39% ICA stenosis Left:   40-59% ICA stenosis    ASSESSMENT/PLAN:: 62 y.o. female here for follow up carotid artery stenosis   -duplex today reveals *** -discussed s/s of stroke with pt and she understands should she develop any of these sx, she will go to the  nearest ER or call 911. -pt will f/u in *** with carotid duplex -pt will call sooner should she have any issues. -continue statin/asa ***  Leontine Locket, [facility] [phone]  Clinic MD:  Trula Slade

## 2022-05-19 ENCOUNTER — Ambulatory Visit: Payer: Medicaid Other | Admitting: Cardiology

## 2022-05-19 ENCOUNTER — Ambulatory Visit (HOSPITAL_COMMUNITY): Payer: Medicaid Other | Attending: Surgery

## 2022-05-19 ENCOUNTER — Ambulatory Visit: Payer: Medicaid Other

## 2022-05-21 NOTE — Progress Notes (Deleted)
Cardiology Office Note:    Date:  05/22/2022   ID:  Tracey Meyer, DOB [birth year], MRN [id number]  PCP:  Gala Lewandowsky, MD  Cardiologist:  Shirlee More, MD    Referring MD: Dorna Mai, MD    ASSESSMENT:    1. Chronic systolic congestive heart failure ([place])   2. Coronary artery disease involving native coronary artery of native heart without angina pectoris   3. Hypertensive heart disease with chronic systolic congestive heart failure ([place])   4. Hyperlipidemia LDL goal <70   5. Cardiac defibrillator in situ    PLAN:    In order of problems listed above:  ***   Next appointment: ***   Medication Adjustments/Labs and Tests Ordered: Current medicines are reviewed at length with the patient today.  Concerns regarding medicines are outlined above.  No orders of the defined types were placed in this encounter.  No orders of the defined types were placed in this encounter.   No chief complaint on file.   History of Present Illness:    Tracey Meyer is a 62 y.o. female with a hx of CAD with drug-eluting stent to left anterior descending coronary artery July 2022 stroke with 70% left ICA stenosis hypertension hyperlipidemia heart failure with reduced ejection fraction most recent EF 30 to 35% September 2023 and subsequent ICD insertion [facility] 12/24/2021 last seen in the 01/07/2022. 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   Cancer ([place])    Cardiomyopathy, ischemic 09/24/2020   Chronic combined systolic and diastolic congestive heart failure (HCC)    CKD (chronic kidney disease), stage  III ([place]) 09/29/2020   Cortical senile cataract of right eye 07/31/2015   DM (diabetes mellitus), type 2 ([place]) [id number]   Embolic stroke ([place]) [id number]   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, 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   Heart failure ([place]) 11/20/2020   HFrEF (heart failure with reduced ejection fraction) ([place]) 09/27/2020   HTN (hypertension)    Hyperlipidemia    Hypertensive urgency 11/21/2020   Hypokalemia    Left-sided weakness    Myocardial infarction [facility])    Nuclear sclerotic cataract of both eyes 07/31/2015   Posterior vitreous detachment of right eye 07/31/2015   Prediabetes    S/P angioplasty with stent to mLAD 09/22/20  09/24/2020   ST elevation myocardial infarction involving left anterior descending (LAD) coronary artery ([place]) 09/22/2020   STEMI involving left anterior descending coronary artery ([place]) 09/22/2020   Stroke ([place])    Tobacco abuse 09/24/2020   Transaminitis 09/24/2020    Past Surgical History:  Procedure Laterality Date   BUBBLE STUDY  10/02/2020   Procedure: BUBBLE STUDY;  Surgeon: Lelon Perla, MD;  Location: [place];  Service: Cardiovascular;;   CORONARY/GRAFT ACUTE MI REVASCULARIZATION N/A 09/22/2020   Procedure: Coronary/Graft Acute MI Revascularization;  Surgeon: Martinique, Peter M, MD;  Location: [place] [facility];  Service: Cardiovascular;  Laterality: N/A;   ICD IMPLANT N/A 12/24/2021   Procedure: ICD IMPLANT;  Surgeon: Constance Haw, MD;  Location: [place] [facility];  Service: Cardiovascular;  Laterality: N/A;   LEFT HEART CATH AND  CORONARY ANGIOGRAPHY N/A 09/22/2020   Procedure: LEFT HEART CATH AND CORONARY ANGIOGRAPHY;  Surgeon: Martinique, Peter M, MD;  Location: [place] [facility];  Service: Cardiovascular;  Laterality: N/A;   mastectomy Left    at age 18 due to concerns of cancer   TEE WITHOUT CARDIOVERSION N/A 10/02/2020   Procedure: TRANSESOPHAGEAL ECHOCARDIOGRAM (TEE);  Surgeon: Lelon Perla, MD;  Location: [facility]  ENDOSCOPY;  Service: Cardiovascular;  Laterality: N/A;    Current Medications: No outpatient medications have been marked as taking for the 05/22/22 encounter (Appointment) with Richardo Priest, MD.     Allergies:   Patient has no known allergies.   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    Comment: pending disability as of 11/21/20  Tobacco Use   Smoking status: Former    Packs/day: 0.50    Years: 45.00    Total pack years: 22.50    Types: Cigarettes    Quit date: 12/06/2021    Years since quitting: 0.4    Passive exposure: Past   Smokeless tobacco: Never   Tobacco comments:    Pt states she puffs on cigarettes but don't inhale  Vaping Use   Vaping Use: Never used  Substance and Sexual Activity   Alcohol use: Not Currently   Drug use: Never   Sexual activity: Not on file  Other Topics Concern   Not on file  Social History Narrative   Not on file   Social Determinants of Health   Financial Resource Strain: High Risk (11/30/2020)   Overall Financial Resource Strain (CARDIA)    Difficulty of Paying Living Expenses: Very hard  Food Insecurity: Food Insecurity Present (11/30/2020)   Hunger Vital Sign    Worried About Running Out of Food in the Last Year: Sometimes true    Ran Out of Food in the Last Year: Sometimes true  Transportation Needs: Unmet Transportation Needs (11/30/2020)   PRAPARE - Hydrologist (Medical): Yes    Lack of Transportation (Non-Medical): Yes  Physical Activity: Not on file  Stress: Not on file  Social Connections: Not on file     Family History: The patient's ***family history includes Cancer in her maternal aunt and mother; Heart disease in her brother; Lupus in her sister. ROS:   Please see the  history of present illness.    All other systems reviewed and are negative.  EKGs/Labs/Other Studies Reviewed:    The following studies were reviewed today:  EKG:  EKG ordered today and personally reviewed.  The ekg ordered today demonstrates ***  Recent Labs: 01/07/2022: ALT 8; BUN 36; Creatinine, Ser 1.76; Hemoglobin 12.4; NT-Pro BNP 3,820; Platelets 275; Potassium 4.4; Sodium 139  Recent Lipid Panel    Component Value Date/Time   CHOL 268 (H) 01/07/2022 1620   TRIG 152 (H) 01/07/2022 1620   HDL 49 01/07/2022 1620   CHOLHDL 5.5 (H) 01/07/2022 1620   CHOLHDL 5.2 09/28/2020 0404   VLDL 20 09/28/2020 0404   LDLCALC 191 (H) 01/07/2022 1620    Physical Exam:    VS:  There were no vitals taken for this visit.    Wt Readings from Last 3 Encounters:  03/31/22 153 lb (69.4 kg)  01/08/22 145  lb 6.4 oz (66 kg)  01/07/22 147 lb (66.7 kg)     GEN: *** Well nourished, well developed in no acute distress HEENT: Normal NECK: No JVD; No carotid bruits LYMPHATICS: No lymphadenopathy CARDIAC: ***RRR, no murmurs, rubs, gallops RESPIRATORY:  Clear to auscultation without rales, wheezing or rhonchi  ABDOMEN: Soft, non-tender, non-distended MUSCULOSKELETAL:  No edema; No deformity  SKIN: Warm and dry NEUROLOGIC:  Alert and oriented x 3 PSYCHIATRIC:  Normal affect    Signed, Shirlee More, MD  05/22/2022 7:34 AM    [place]

## 2022-05-22 ENCOUNTER — Ambulatory Visit: Payer: Medicaid Other | Admitting: Cardiology

## 2022-05-22 ENCOUNTER — Telehealth: Payer: Self-pay | Admitting: Cardiology

## 2022-05-22 NOTE — Telephone Encounter (Signed)
Caller want a copy of patient's medications with instructions faxed to fax# [phone], Attn:  [facility]

## 2022-05-22 NOTE — Telephone Encounter (Signed)
Caller stated patient said she is taking ticagrelor (BRILINTA) 90 MG TABS tablet  but this is not on the patient's current list of medications.

## 2022-05-22 NOTE — Telephone Encounter (Signed)
Med list faxed to ED per request - Attn: Karena Addison

## 2022-05-22 NOTE — Telephone Encounter (Signed)
Spoke with [facility]. She stated that pt says she is taking Brilinta. Reviewed chart and see that Brilinta was discontinued on 07-08-21 and Plavix was begun do not see that Brilinta has been restarted at this office.

## 2022-06-02 NOTE — Progress Notes (Deleted)
Cardiology Office Note:    Date:  06/02/2022   ID:  Tracey Meyer, DOB Nov 15, 1960, MRN [id number]  PCP:  Gala Lewandowsky, MD  Cardiologist:  Shirlee More, MD    Referring MD: Dorna Mai, MD    ASSESSMENT:    No diagnosis found. PLAN:    In order of problems listed above:  ***   Next appointment: ***   Medication Adjustments/Labs and Tests Ordered: Current medicines are reviewed at length with the patient today.  Concerns regarding medicines are outlined above.  No orders of the defined types were placed in this encounter.  No orders of the defined types were placed in this encounter.   No chief complaint on file.   History of Present Illness:    Tracey Meyer is a 62 y.o. female with a hx of CAD with ischemic cardiomyopathy chronic systolic heart failure hypertensive heart disease hyperlipidemia and ICD last seen 01/07/2022.She is followed by vascular surgery follow-up carotid duplex 02/05/2021 showed right ICA stenosis 1 to 39% left ICA stenosis 40 to 59% less than 50% left common carotid artery stenosis.She was last seen 07/08/2021.She had an ICD placed 12/23/2021. 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   Cancer ([place])    Cardiomyopathy, ischemic 09/24/2020   Chronic combined systolic and diastolic congestive heart failure (HCC)    CKD (chronic kidney disease), stage III ([place]) 09/29/2020   Cortical senile cataract of right eye 07/31/2015   DM (diabetes mellitus), type 2 ([place]) [id number]   Embolic stroke ([place]) [id number]   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, indeterminate stage  07/31/2015   Heart failure ([place]) 11/20/2020   HFrEF (heart failure with reduced ejection fraction) ([place]) 09/27/2020   HTN (hypertension)    Hyperlipidemia    Hypertensive urgency 11/21/2020   Hypokalemia    Left-sided weakness    Myocardial infarction [facility])    Nuclear sclerotic cataract of both eyes 07/31/2015   Posterior vitreous detachment of right eye 07/31/2015   Prediabetes    S/P angioplasty with stent to mLAD 09/22/20  09/24/2020   ST elevation myocardial infarction involving left anterior descending (LAD) coronary artery ([place]) 09/22/2020   STEMI involving left anterior descending coronary artery ([place]) 09/22/2020   Stroke ([place])    Tobacco abuse 09/24/2020   Transaminitis 09/24/2020    Past Surgical History:  Procedure Laterality Date   BUBBLE STUDY  10/02/2020   Procedure: BUBBLE STUDY;  Surgeon: Lelon Perla, MD;  Location: [facility];  Service: Cardiovascular;;   CORONARY/GRAFT ACUTE MI REVASCULARIZATION N/A 09/22/2020   Procedure: Coronary/Graft Acute MI Revascularization;  Surgeon: Martinique, Peter M, MD;  Location: [facility];  Service: Cardiovascular;  Laterality: N/A;   ICD IMPLANT N/A 12/24/2021   Procedure: ICD IMPLANT;  Surgeon: Constance Haw, MD;  Location: [facility];  Service: Cardiovascular;  Laterality: N/A;   LEFT HEART CATH AND CORONARY ANGIOGRAPHY N/A 09/22/2020   Procedure: LEFT HEART CATH AND CORONARY ANGIOGRAPHY;  Surgeon: Martinique, Peter M, MD;  Location: [facility];  Service: Cardiovascular;  Laterality: N/A;   mastectomy Left    at age 22  due to concerns of cancer   TEE WITHOUT CARDIOVERSION N/A 10/02/2020   Procedure: TRANSESOPHAGEAL ECHOCARDIOGRAM (TEE);  Surgeon: Lelon Perla, MD;  Location: [facility] ENDOSCOPY;  Service: Cardiovascular;  Laterality: N/A;    Current Medications: No outpatient medications have been marked as taking for the 06/03/22 encounter (Appointment) with Richardo Priest, MD.     Allergies:   Patient has no  known allergies.   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    Comment: pending disability as of 11/21/20  Tobacco Use   Smoking status: Former    Packs/day: 0.50    Years: 45.00    Total pack years: 22.50    Types: Cigarettes    Quit date: 12/06/2021    Years since quitting: 0.4    Passive exposure: Past   Smokeless tobacco: Never   Tobacco comments:    Pt states she puffs on cigarettes but don't inhale  Vaping Use   Vaping Use: Never used  Substance and Sexual Activity   Alcohol use: Not Currently   Drug use: Never   Sexual activity: Not on file  Other Topics Concern   Not on file  Social History Narrative   Not on file   Social Determinants of Health   Financial Resource Strain: High Risk (11/30/2020)   Overall Financial Resource Strain (CARDIA)    Difficulty of Paying Living Expenses: Very hard  Food Insecurity: Food Insecurity Present (11/30/2020)   Hunger Vital Sign    Worried About Running Out of Food in the Last Year: Sometimes true    Ran Out of Food in the Last Year: Sometimes true  Transportation Needs: Unmet Transportation Needs (11/30/2020)   PRAPARE - Hydrologist (Medical): Yes    Lack of Transportation (Non-Medical): Yes  Physical Activity: Not on file  Stress: Not on file  Social Connections: Not on file     Family History: The patient's ***family history includes Cancer in her maternal aunt and mother; Heart disease in her brother; Lupus in her sister. ROS:   Please see the history of present illness.    All other systems reviewed and are negative.  EKGs/Labs/Other Studies Reviewed:    The following studies were reviewed today:  Cardiac Studies & Procedures   CARDIAC CATHETERIZATION  CARDIAC  CATHETERIZATION 09/22/2020  Narrative  RPDA lesion is 50% stenosed.  Dist Cx lesion is 30% stenosed with 30% stenosed side branch in LPAV.  1st Diag lesion is 60% stenosed.  Mid LAD lesion is 100% stenosed.  Post intervention, there is a 0% residual stenosis.  A drug-eluting stent was successfully placed using a STENT ONYX FRONTIER 2.5X38.  LV end diastolic pressure is mildly elevated.  1. 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 Mildly elevated LVEDP 21 mmHg 3. Successful PCI of the mid LAD with DES x 1  Plan: DAPT for one year. I suspect she will have significant myocardial injury given late presentation. Will check Echo. Aggressive risk factor modification.  Findings Coronary Findings Diagnostic  Dominance: Co-dominant  Left Anterior Descending Mid LAD lesion is 100% stenosed. The lesion is thrombotic.  First Diagonal Branch 1st Diag lesion is 60% stenosed.  Left Circumflex Dist Cx lesion is 30%  stenosed with 30% stenosed side branch in LPAV.  Right Coronary Artery  Right Posterior Descending Artery RPDA lesion is 50% stenosed.  Intervention  Mid LAD lesion Stent CATH VISTA GUIDE 6FR XBLAD3.5 guide catheter was inserted. Lesion crossed with guidewire using a WIRE ASAHI PROWATER 180CM. Pre-stent angioplasty was performed using a BALLOON SAPPHIRE 2.0X12. A drug-eluting stent was successfully placed using a STENT ONYX FRONTIER 2.5X38. Stent strut is well apposed. Post-stent angioplasty was performed using a BALLOON SAPPHIRE [place] 3.0X15. Maximum pressure:  18 atm. Post-Intervention Lesion Assessment The intervention was successful. Pre-interventional TIMI flow is 0. Post-intervention TIMI flow is 3. No complications occurred at this lesion. There is a 0% residual stenosis post intervention.   STRESS TESTS  MYOCARDIAL PERFUSION IMAGING 12/20/2021   ECHOCARDIOGRAM  ECHOCARDIOGRAM COMPLETE 07/25/2021  Narrative ECHOCARDIOGRAM REPORT    Patient  Name:   Tracey Meyer Date of Exam: 07/25/2021 Medical Rec #:  [id number]          Height:       64.0 in Accession #:    [id number]         Weight:       150.0 lb Date of Birth:  [birth year]          BSA:          1.731 m Patient Age:    50 years           BP:           128/64 mmHg Patient Gender: F                  HR:           73 bpm. Exam Location:  [facility]  Procedure: 2D Echo, Cardiac Doppler, Color Doppler and Strain Analysis  Indications:    Coronary artery disease of native artery of native heart with stable angina pectoris ([place]) [I25.118 (ICD-10-CM)]; Ischemic cardiomyopathy [I25.5 (ICD-10-CM)]; Essential hypertension [I10 (ICD-10-CM)]; Hyperlipidemia LDL goal <70 [E78.5 (ICD-10-CM)]; History of CVA (cerebrovascular accident) [Z86.73 (ICD-10-CM)]  History:        Patient has prior history of Echocardiogram examinations, most recent 09/28/2020. Cardiomyopathy and CHF, Previous Myocardial Infarction, Stroke; Risk Factors:Hypertension and Dyslipidemia.  Sonographer:    Philipp Deputy RDCS Referring Phys: [id number] [place]   1. Akinetic anteroseptum. Left ventricular ejection fraction, by estimation, is 30 to 35%. The left ventricle has moderately decreased function. The left ventricle demonstrates regional wall motion abnormalities. 2. Right ventricular systolic function is normal. The right ventricular size is normal. There is normal pulmonary artery systolic pressure. 3. Left atrial size was mildly dilated. 4. The mitral valve is normal in structure. Moderate mitral valve regurgitation. No evidence of mitral stenosis. 5. The aortic valve is normal in structure. Aortic valve regurgitation is not visualized. Aortic valve sclerosis/calcification is present, without any evidence of aortic stenosis. 6. The inferior vena cava is normal in size with greater than 50% respiratory variability, suggesting right atrial pressure of 3 mmHg.  FINDINGS Left Ventricle:  Akinetic anteroseptum. Left ventricular ejection fraction, by estimation, is 30 to 35%. The left ventricle has moderately decreased function. The left ventricle demonstrates regional wall motion abnormalities. The left ventricular internal cavity size was normal in size. There is no left ventricular hypertrophy. Left ventricular diastolic parameters are consistent with Grade III diastolic dysfunction (restrictive).  Right Ventricle: The right ventricular size is normal. No increase in right ventricular wall thickness. Right ventricular systolic function is normal. There is normal pulmonary  artery systolic pressure. The tricuspid regurgitant velocity is 2.34 m/s, and with an assumed right atrial pressure of 3 mmHg, the estimated right ventricular systolic pressure is 99991111 mmHg.  Left Atrium: Left atrial size was mildly dilated.  Right Atrium: Right atrial size was normal in size.  Pericardium: There is no evidence of pericardial effusion.  Mitral Valve: The mitral valve is normal in structure. Moderate mitral valve regurgitation. No evidence of mitral valve stenosis.  Tricuspid Valve: The tricuspid valve is normal in structure. Tricuspid valve regurgitation is not demonstrated. No evidence of tricuspid stenosis.  Aortic Valve: The aortic valve is normal in structure. Aortic valve regurgitation is not visualized. Aortic valve sclerosis/calcification is present, without any evidence of aortic stenosis.  Pulmonic Valve: The pulmonic valve was normal in structure. Pulmonic valve regurgitation is not visualized. No evidence of pulmonic stenosis.  Aorta: The aortic root is normal in size and structure.  Venous: The inferior vena cava is normal in size with greater than 50% respiratory variability, suggesting right atrial pressure of 3 mmHg.  IAS/Shunts: No atrial level shunt detected by color flow Doppler.   LEFT VENTRICLE PLAX 2D LVIDd:         5.20 cm   Diastology LVIDs:         4.20 cm   LV  e' medial:    3.71 cm/s LV PW:         1.10 cm   LV E/e' medial:  40.4 LV IVS:        0.80 cm   LV e' lateral:   5.50 cm/s LVOT diam:     1.60 cm   LV E/e' lateral: 27.3 LV SV:         42 LV SV Index:   24 LVOT Area:     2.01 cm   RIGHT VENTRICLE            IVC RV Basal diam:  2.50 cm    IVC diam: 1.30 cm RV S prime:     6.73 cm/s TAPSE (M-mode): 2.3 cm  LEFT ATRIUM           Index        RIGHT ATRIUM           Index LA diam:      3.60 cm 2.08 cm/m   RA Area:     11.60 cm LA Vol (A2C): 44.7 ml 25.82 ml/m  RA Volume:   25.50 ml  14.73 ml/m LA Vol (A4C): 49.0 ml 28.30 ml/m AORTIC VALVE LVOT Vmax:   109.00 cm/s LVOT Vmean:  73.200 cm/s LVOT VTI:    0.207 m  AORTA Ao Root diam: 2.50 cm Ao Asc diam:  2.80 cm Ao Desc diam: 1.70 cm  MITRAL VALVE                  TRICUSPID VALVE MV Area (PHT): 4.36 cm       TR Peak grad:   21.9 mmHg MV Decel Time: 174 msec       TR Vmax:        234.00 cm/s MR Peak grad:    66.9 mmHg MR Mean grad:    50.0 mmHg    SHUNTS MR Vmax:         409.00 cm/s  Systemic VTI:  0.21 m MR Vmean:        337.0 cm/s   Systemic Diam: 1.60 cm MR PISA:         1.01 cm MR  PISA Eff ROA: 10 mm MR PISA Radius:  0.40 cm MV E velocity: 150.00 cm/s MV A velocity: 27.70 cm/s MV E/A ratio:  5.42  Jyl Heinz MD Electronically signed by Jyl Heinz MD Signature Date/Time: 07/25/2021/1:02:58 PM    Final   TEE  ECHO TEE 10/02/2020  Narrative TRANSESOPHOGEAL ECHO REPORT    Patient Name:   Tracey Meyer Date of Exam: 10/02/2020 Medical Rec #:  [id number]          Height:       64.0 in Accession #:    [id number]         Weight:       154.5 lb Date of Birth:  1961-03-14          BSA:          1.753 m Patient Age:    79 years           BP:           147/70 mmHg Patient Gender: F                  HR:           75 bpm. Exam Location:  Inpatient  Procedure: Transesophageal Echo, 3D Echo, Cardiac Doppler, Color Doppler and Strain  Analysis  Indications:    Cerebral Infarction, unspecified I63.9  History:        Patient has prior history of Echocardiogram examinations, most recent 09/28/2020. CHF; Risk Factors:Diabetes, Dyslipidemia and Current Smoker.  Sonographer:    Darlina Sicilian RDCS Referring Phys: [place]: After discussion of the risks and benefits of a TEE, an informed consent was obtained from the patient. TEE procedure time was 11 minutes. The transesophogeal probe was passed without difficulty through the esophogus of the patient. Imaged were obtained with the patient in a left lateral decubitus position. Sedation performed by different physician. The patient was monitored while under deep sedation. Image quality was good. The patient's vital signs; including heart rate, blood pressure, and oxygen saturation; remained stable throughout the procedure. The patient developed no complications during the procedure.  IMPRESSIONS   1. Akinesis of the anteroseptal wall and apex with overall moderate to severe LV dysfunction; no obvious apical thrombus noted using definity. 2. Left ventricular ejection fraction, by estimation, is 30 to 35%. The left ventricle has moderate to severely decreased function. The left ventricle demonstrates regional wall motion abnormalities (see scoring diagram/findings for description). The left ventricular internal cavity size was mildly dilated. 3. Right ventricular systolic function is normal. The right ventricular size is normal. 4. Left atrial size was mildly dilated. No left atrial/left atrial appendage thrombus was detected. 5. A small pericardial effusion is present. 6. The mitral valve is normal in structure. Mild mitral valve regurgitation. 7. The aortic valve is tricuspid. Aortic valve regurgitation is not visualized. 8. There is mild (Grade II) plaque involving the descending aorta. 9. Agitated saline contrast bubble study was positive with shunting  observed after >6 cardiac cycles suggestive of intrapulmonary shunting.  FINDINGS Left Ventricle: Left ventricular ejection fraction, by estimation, is 30 to 35%. The left ventricle has moderate to severely decreased function. The left ventricle demonstrates regional wall motion abnormalities. The left ventricular internal cavity size was mildly dilated.  Right Ventricle: The right ventricular size is normal. Right ventricular systolic function is normal.  Left Atrium: Left atrial size was mildly dilated. No left atrial/left atrial appendage thrombus was detected.  Right Atrium: Right atrial  size was normal in size.  Pericardium: A small pericardial effusion is present.  Mitral Valve: The mitral valve is normal in structure. Mild mitral valve regurgitation.  Tricuspid Valve: The tricuspid valve is normal in structure. Tricuspid valve regurgitation is trivial.  Aortic Valve: The aortic valve is tricuspid. Aortic valve regurgitation is not visualized.  Pulmonic Valve: The pulmonic valve was normal in structure. Pulmonic valve regurgitation is not visualized.  Aorta: The aortic root is normal in size and structure. There is mild (Grade II) plaque involving the descending aorta.  IAS/Shunts: No atrial level shunt detected by color flow Doppler. Agitated saline contrast bubble study was positive with shunting observed after >6 cardiac cycles suggestive of intrapulmonary shunting.  Additional Comments: Akinesis of the anteroseptal wall and apex with overall moderate to severe LV dysfunction; no obvious apical thrombus noted using definity.    3D Volume EF LV 3D EDV:   115.45 ml LV 3D ESV:   76.35 ml   AORTA Ao Root diam: 2.50 cm Ao Asc diam:  2.70 cm  MITRAL VALVE MV Area (plan): 5.03 cm  Kirk Ruths MD Electronically signed by Kirk Ruths MD Signature Date/Time: 10/02/2020/10:16:20 AM    Final   MONITORS  LONG TERM MONITOR (3-14 DAYS) 01/01/2021  Narrative   Normal sinus rhythm  Rare PACs with 8 brief runs of PAT. longest 12 beats  One 4 beat run NSVT  Symptoms on one occasion during sinus tachycardia   Patch Wear Time:  13 days and 16 hours (2022-09-19T15:07:12-398 to 2022-10-03T07:13:56-0400)  Patient had a min HR of 51 bpm, max HR of 184 bpm, and avg HR of 79 bpm. Predominant underlying rhythm was Sinus Rhythm. 1 run of Ventricular Tachycardia occurred lasting 4 beats with a max rate of 148 bpm (avg 143 bpm). 8 Supraventricular Tachyca rdia runs occurred, the run with the fastest interval lasting 12 beats with a max rate of 184 bpm, the longest lasting 12 beats with an avg rate of 148 bpm. Isolated SVEs were rare (<1.0%), SVE Couplets were rare (<1.0%), and SVE Triplets were rare (<1.0%). Isolated VEs were rare (<1.0%), VE Couplets were rare (<1.0%), and no VE Triplets were present. Ventricular Bigeminy and Trigeminy were present.           EKG:  EKG ordered today and personally reviewed.  The ekg ordered today demonstrates ***  Recent Labs: 01/07/2022: ALT 8; BUN 36; Creatinine, Ser 1.76; Hemoglobin 12.4; NT-Pro BNP 3,820; Platelets 275; Potassium 4.4; Sodium 139  Recent Lipid Panel    Component Value Date/Time   CHOL 268 (H) 01/07/2022 1620   TRIG 152 (H) 01/07/2022 1620   HDL 49 01/07/2022 1620   CHOLHDL 5.5 (H) 01/07/2022 1620   CHOLHDL 5.2 09/28/2020 0404   VLDL 20 09/28/2020 0404   LDLCALC 191 (H) 01/07/2022 1620    Physical Exam:    VS:  There were no vitals taken for this visit.    Wt Readings from Last 3 Encounters:  03/31/22 153 lb (69.4 kg)  01/08/22 145 lb 6.4 oz (66 kg)  01/07/22 147 lb (66.7 kg)     GEN: *** Well nourished, well developed in no acute distress HEENT: Normal NECK: No JVD; No carotid bruits LYMPHATICS: No lymphadenopathy CARDIAC: ***RRR, no murmurs, rubs, gallops RESPIRATORY:  Clear to auscultation without rales, wheezing or rhonchi  ABDOMEN: Soft, non-tender,  non-distended MUSCULOSKELETAL:  No edema; No deformity  SKIN: Warm and dry NEUROLOGIC:  Alert and oriented x 3 PSYCHIATRIC:  Normal affect  Signed, Shirlee More, MD  06/02/2022 2:04 PM    [place]

## 2022-06-03 ENCOUNTER — Ambulatory Visit: Payer: Medicaid Other | Attending: Cardiology | Admitting: Cardiology

## 2022-06-04 ENCOUNTER — Encounter: Payer: Self-pay | Admitting: Cardiology

## 2022-06-12 ENCOUNTER — Encounter: Payer: Self-pay | Admitting: *Deleted

## 2022-06-12 LAB — LAB REPORT - SCANNED
A1c: 7.6
EGFR: 49

## 2022-06-25 ENCOUNTER — Ambulatory Visit (INDEPENDENT_AMBULATORY_CARE_PROVIDER_SITE_OTHER): Payer: Medicaid Other

## 2022-06-25 DIAGNOSIS — I255 Ischemic cardiomyopathy: Secondary | ICD-10-CM

## 2022-06-30 LAB — CUP PACEART REMOTE DEVICE CHECK
Battery Remaining Longevity: 118 mo
Battery Remaining Percentage: 95 %
Battery Voltage: 3.05 V
Brady Statistic RV Percent Paced: 1 %
Date Time Interrogation Session: 20240404050746
HighPow Impedance: 54 Ohm
Implantable Lead Connection Status: 753985
Implantable Lead Implant Date: 20231003
Implantable Lead Location: 753860
Implantable Pulse Generator Implant Date: 20231003
Lead Channel Impedance Value: 500 Ohm
Lead Channel Pacing Threshold Amplitude: 0.5 V
Lead Channel Pacing Threshold Pulse Width: 0.5 ms
Lead Channel Sensing Intrinsic Amplitude: 11.8 mV
Lead Channel Setting Pacing Amplitude: 2.5 V
Lead Channel Setting Pacing Pulse Width: 0.5 ms
Lead Channel Setting Sensing Sensitivity: 0.5 mV
Pulse Gen Serial Number: 211008330
Zone Setting Status: 755011

## 2022-07-01 ENCOUNTER — Other Ambulatory Visit: Payer: Self-pay

## 2022-07-01 DIAGNOSIS — Z95810 Presence of automatic (implantable) cardiac defibrillator: Secondary | ICD-10-CM

## 2022-07-01 MED ORDER — CARVEDILOL 25 MG PO TABS: 25.0000 mg | ORAL_TABLET | Freq: Two times a day (BID) | ORAL | 0 refills | Status: DC

## 2022-07-10 ENCOUNTER — Other Ambulatory Visit: Payer: Self-pay

## 2022-07-17 ENCOUNTER — Encounter: Payer: Self-pay | Admitting: Cardiology

## 2022-07-17 NOTE — Progress Notes (Signed)
Cardiology Office Note:    Date:  07/18/2022   ID:  [name], DOB 1960-09-03, MRN [id number]  PCP:  Tracey Berger, MD   [facility] Providers Cardiologist:  Tracey Herrlich, MD Electrophysiologist:  Tracey Lemming, MD     Referring MD: Tracey Berger, MD   CC: follow up post hospitalization  History of Present Illness:    Tracey Meyer is a 62 y.o. female with a hx of CAD (s/p DES to the LAD in 2022), ischemic cardiomyopathy, HFrEF, hypertension, embolic stroke with residual deficits, left-sided 70% ICA stenosis, DM 2, CKD stage III, hyperlipidemia, tobacco abuse, glaucoma.  She had an anterior STEMI in July 2022, s/p PCI to LAD with newly diagnosed ischemic cardiomyopathy, EF 30 to 35%.  She was readmitted a few days after initial discharge with stroke and left-sided weakness.  TEE was without thrombus.   Carotid ultrasound on 02/05/2021, right ICA mild stenosis, ECA appeared greater than 50% stenosis.  Left ICA consistent with 40 to 59% stenosis.  MPI on 12/10/2021 at [facility] which was without ischemia.  Echocardiogram on 04/08/2021 revealed EF 30 to 35% with anteroseptal marked hypokinesis to akinesis, LA moderately dilated, trivial to mild MR, mild pulmonic valve regurgitation, RVSP 37 mmHg, ascending aorta, ascending root were poorly visualized.  Evaluated by Dr. Dulce Meyer on 01/07/2022, at this time she was doing well from a cardiac perspective.  Her Entresto was increased.   Evaluated by Dr. Elberta Meyer on 03/31/2022, she was doing well at this time although her blood pressure was slightly elevated.  No changes were made at this visit.  She was admitted to [facility] for HF exacerbation.  She had a device check on 06/26/2022 for abnormal device interrogation, lead parameters and battery status were stable, periods of NSVT, her Coreg was increased to 25 mg twice daily.   She presents today by her husband for follow-up after recent  hospitalization.  There was a lot of confusion surrounding her medication regimen however she was recently evaluated by her PCP and presents the med list from their office.  From this med list, she states she has been out of Lasix for a month, has been out of [facility] for several weeks as well.  She states she is feeling okay today however she endorses some DOE and PND.  She is unable to weigh herself as she is in a wheelchair secondary to previous strokes which have left her with left-sided hemiparesis.  She is trying to watch her fluid intake however states most of her fluid is [employer] or [facility].  She tries to adhere to a low-sodium diet.  She denies chest pain, palpitations, dizziness.  She has pedal edema although it is better than when she was most recently admitted, she has had some instances of PND however the last few nights she has been able to sleep in a reclined position in the bed.  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   Cancer (HCC)    Cardiomyopathy, ischemic 09/24/2020   Carotid artery stenosis 2022   Chronic combined systolic and diastolic congestive heart failure (HCC)    CKD (chronic kidney disease), stage III (HCC)  09/29/2020   Cortical senile cataract of right eye 07/31/2015   DM (diabetes mellitus), type 2 (HCC) 09/24/2020   Embolic stroke (HCC) 10/09/2020   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, 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   Heart failure (HCC) 11/20/2020   HFrEF (heart failure with reduced ejection fraction) (HCC) 09/27/2020   HTN (hypertension)    Hyperlipidemia    Hypertensive urgency 11/21/2020   Hypokalemia    Left-sided  weakness    Myocardial infarction [facility])    Nuclear sclerotic cataract of both eyes 07/31/2015   Posterior vitreous detachment of right eye 07/31/2015   Prediabetes    S/P angioplasty with stent to mLAD 09/22/20  09/24/2020   ST elevation myocardial infarction involving left anterior descending (LAD) coronary artery (HCC) 09/22/2020   STEMI involving left anterior descending coronary artery (HCC) 09/22/2020   Stroke (HCC)    Tobacco abuse 09/24/2020   Transaminitis 09/24/2020    Past Surgical History:  Procedure Laterality Date   BUBBLE STUDY  10/02/2020   Procedure: BUBBLE STUDY;  Surgeon: Tracey Bunting, MD;  Location: [facility] ENDOSCOPY;  Service: Cardiovascular;;   CORONARY/GRAFT ACUTE MI REVASCULARIZATION N/A 09/22/2020   Procedure: Coronary/Graft Acute MI Revascularization;  Surgeon: Swaziland, Peter M, MD;  Location: [facility] INVASIVE CV LAB;  Service: Cardiovascular;  Laterality: N/A;   ICD IMPLANT N/A 12/24/2021   Procedure: ICD IMPLANT;  Surgeon: Tracey Lemming, MD;  Location: [facility] INVASIVE CV LAB;  Service: Cardiovascular;  Laterality: N/A;   LEFT HEART CATH AND CORONARY ANGIOGRAPHY N/A 09/22/2020   Procedure: LEFT HEART CATH AND CORONARY ANGIOGRAPHY;  Surgeon: Swaziland, Peter M, MD;  Location: [facility] INVASIVE CV LAB;  Service: Cardiovascular;  Laterality: N/A;   mastectomy Left    at age 60 due to concerns of cancer   TEE WITHOUT CARDIOVERSION N/A 10/02/2020   Procedure: TRANSESOPHAGEAL ECHOCARDIOGRAM (TEE);  Surgeon: Tracey Bunting, MD;  Location: [facility] ENDOSCOPY;  Service: Cardiovascular;  Laterality: N/A;    Current Medications: Current Meds  Medication Sig   acetaminophen (TYLENOL) 325 MG tablet Take 2 tablets (650 mg total) by mouth every 4 (four) hours as needed for headache or mild pain.   aspirin EC 81 MG tablet Take 81 mg by mouth daily. Swallow whole.   carvedilol (COREG) 25 MG tablet Take 25 mg by mouth 2 (two) times daily with a meal.   clopidogrel (PLAVIX) 75 MG tablet Take  1 tablet (75 mg total) by mouth daily.   dapagliflozin propanediol (FARXIGA) 10 MG TABS tablet Take 1 tablet (10 mg total) by mouth daily.   furosemide (LASIX) 40 MG tablet Take 1 tablet (40 mg total) by mouth daily. Staring 07/19/22 until 07/21/22   nitroGLYCERIN (NITROSTAT) 0.4 MG SL tablet Place 1 tablet (0.4 mg total) under the tongue every 5 (five) minutes x 3 doses as needed for chest pain.   ondansetron (ZOFRAN-ODT) 4 MG disintegrating tablet Take 4 mg by mouth every 6 (six) hours as needed for nausea.   promethazine-dextromethorphan (PROMETHAZINE-DM) 6.25-15 MG/5ML syrup Take 5 mLs by mouth every 6 (six) hours as needed for cough.   sacubitril-valsartan (ENTRESTO) 49-51 MG Take 1 tablet by mouth 2 (two) times daily.   spironolactone (ALDACTONE) 25 MG tablet Take 0.5 tablets (12.5 mg total) by mouth daily.   VENTOLIN HFA 108 (90 Base) MCG/ACT inhaler Inhale 2 puffs into the lungs every 6 (six) hours as needed for wheezing or shortness of breath.     Allergies:   Patient has no known allergies.  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    Comment: pending disability as of 11/21/20  Tobacco Use   Smoking status: Former    Packs/day: 0.50    Years: 45.00    Additional pack years: 0.00    Total pack years: 22.50    Types: Cigarettes    Quit date: 12/06/2021    Years since quitting: 0.6    Passive exposure: Past   Smokeless tobacco: Never   Tobacco comments:    Pt states she puffs on cigarettes but don't inhale  Vaping Use   Vaping Use: Never used  Substance and Sexual Activity   Alcohol use: Not Currently   Drug use: Never   Sexual activity: Not on file  Other Topics Concern   Not on file  Social History Narrative   Not on file   Social Determinants of  Health   Financial Resource Strain: High Risk (11/30/2020)   Overall Financial Resource Strain (CARDIA)    Difficulty of Paying Living Expenses: Very hard  Food Insecurity: Food Insecurity Present (11/30/2020)   Hunger Vital Sign    Worried About Running Out of Food in the Last Year: Sometimes true    Ran Out of Food in the Last Year: Sometimes true  Transportation Needs: Unmet Transportation Needs (11/30/2020)   PRAPARE - Administrator, Civil Service (Medical): Yes    Lack of Transportation (Non-Medical): Yes  Physical Activity: Not on file  Stress: Not on file  Social Connections: Not on file     Family History: The patient's family history includes Cancer in her maternal aunt and mother; Heart disease in her brother; Lupus in her sister.  ROS:   Please see the history of present illness.     All other systems reviewed and are negative.  EKGs/Labs/Other Studies Reviewed:    The following studies were reviewed today:   EKG:  EKG is not ordered today.    Recent Labs: 01/07/2022: ALT 8; BUN 36; Creatinine, Ser 1.76; Hemoglobin 12.4; NT-Pro BNP 3,820; Platelets 275; Potassium 4.4; Sodium 139  Recent Lipid Panel    Component Value Date/Time   CHOL 268 (H) 01/07/2022 1620   TRIG 152 (H) 01/07/2022 1620   HDL 49 01/07/2022 1620   CHOLHDL 5.5 (H) 01/07/2022 1620   CHOLHDL 5.2 09/28/2020 0404   VLDL 20 09/28/2020 0404   LDLCALC 191 (H) 01/07/2022 1620     Risk Assessment/Calculations:          Physical Exam:    VS:  BP (!) 146/80   Pulse 64   Ht 5\' 3"  (1.6 m)   Wt 161 lb 12.8 oz (73.4 kg)   SpO2 96%   BMI 28.66 kg/m     Wt Readings from Last 3 Encounters:  07/18/22 161 lb 12.8 oz (73.4 kg)  06/11/22 151 lb (68.5 kg)  03/31/22 153 lb (69.4 kg)     GEN:  Well nourished, ill appearing, but no acute distress  HEENT: Normal NECK: + JVD; No carotid bruits LYMPHATICS: No lymphadenopathy CARDIAC: RRR, no murmurs, rubs, gallops RESPIRATORY:  Clear to  auscultation without rales, wheezing or rhonchi  ABDOMEN: Soft, non-tender, non-distended MUSCULOSKELETAL: +1 pedal edema; No deformity  SKIN: Warm and dry NEUROLOGIC:  Alert and oriented x 3 PSYCHIATRIC:  Normal affect   ASSESSMENT:    1. Coronary artery disease involving native coronary artery of native heart without angina pectoris   2. Chronic systolic congestive heart failure (HCC)   3. ICD (implantable cardioverter-defibrillator) in place   4. Essential hypertension   5. Ischemic cardiomyopathy   6. Bilateral carotid artery stenosis   7. Coronary artery disease of native artery of native heart with stable angina pectoris (HCC)    PLAN:    In order of problems listed above:  CAD-s/p DES to LAD in 2022, Stable with no anginal symptoms. No indication for ischemic evaluation.  Continue aspirin 81 mg daily, continue Coreg 25 mg twice daily, continue Plavix 75 mg daily, continue nitroglycerin as needed--has not needed, continue Crestor 40 mg daily. Chronic systolic heart failure/ICM-most recent EF 30 to 35%, s/p ICD implanted 05/24/2021. NYHA class II-III today, she is volume overloaded.  She has been out of her Lasix for approximately a month, has not taken her Sherryll Burger since her hospital discharge--the best she can recall.  Will provide her with 2 weeks of Entresto samples, will send in refill for Lasix--she is to take 40 mg every morning times next 3 days, then proceed to her previous directions of Monday Wednesday Friday.  She is unable to weigh herself daily due to left-sided hemiparesis.  We reviewed fluid restriction, sodium restriction, and pain close attention to her respiratory status. continue Coreg 25 mg twice daily, continue Farxiga 10 mg daily, continue spironolactone 12.5 mg daily.  Labs recently obtained by her PCP, showed stable CKD, potassium 4.0. Hypertension-blood pressure is elevated today at 146/80, however she has been out of Entresto for some time.  Will provide her with  samples today per above.  Continue Coreg 25 mg twice daily, continue spironolactone 12.5 mg daily. Hyperlipidemia-LDL on 01/07/2022 elevated at 191.  We did not get to address this at this visit, will discuss in next appointment in 4 weeks.  Not clear if she is taking her statin or not as there is much confusion surrounding her medication regimen. Carotid artery stenosis-most recent imaging in 2022 revealed moderate stenosis in her left ICA, mild stenosis right ICA.  Repeat carotid ultrasound has been ordered.  Disposition-restart Entresto 49-51 mg twice daily, restart Lasix 40 mg p.o. times next 3 days, then Monday Wednesday Friday.  Will repeat BMET in 2 weeks.  She will return in 4 weeks and was encouraged to bring all of her medications with her so we can review them.  Will discuss statin use with her at this time.       Medication Adjustments/Labs and Tests Ordered: Current medicines are reviewed at length with the patient today.  Concerns regarding medicines are outlined above.  Orders Placed This Encounter  Procedures   Basic metabolic panel   Meds ordered this encounter  Medications   furosemide (LASIX) 40 MG tablet    Sig: Take 1 tablet (40 mg total) by mouth daily. Staring 07/19/22 until 07/21/22    Dispense:  3 tablet    Refill:  0   furosemide (LASIX) 40 MG tablet    Sig: Take 1 tablet (40 mg total) by mouth 3 (three) times a week. M-W-F    Dispense:  36 tablet    Refill:  3   sacubitril-valsartan (ENTRESTO) 49-51 MG    Sig: Take 1 tablet by mouth 2 (two) times daily.    Dispense:  180 tablet    Refill:  3  sacubitril-valsartan (ENTRESTO) 49-51 MG    Sig: Take 1 tablet by mouth 2 (two) times daily.    Dispense:  28 tablet    Refill:  0    Order Specific Question:   Lot Number?    Answer:   [id number]    Order Specific Question:   Expiration Date?    Answer:   10/11/2023    Order Specific Question:   Quantity    Answer:   28    Patient Instructions  Medication  Instructions:  Start taking Furosemide 40 mg 1 tablet daily for 3 days from 07/19/22 until 07/21/22 After 07/21/22 you will take 40 mg of Furosemide on Mondays, Wednesdays, and Fridays. Entresto 49-51 mg by Elonda Husky 2 times a day   *If you need a refill on your cardiac medications before your next appointment, please call your pharmacy*   Lab Work: BMET in 2 weeks   If you have labs (blood work) drawn today and your tests are completely normal, you will receive your results only by: MyChart Message (if you have MyChart) OR A paper copy in the mail If you have any lab test that is abnormal or we need to change your treatment, we will call you to review the results.   Testing/Procedures:   Follow-Up: At [facility], you and your health needs are our priority.  As part of our continuing mission to provide you with exceptional heart care, we have created designated Provider Care Teams.  These Care Teams include your primary Cardiologist (physician) and Advanced Practice Providers (APPs -  Physician Assistants and Nurse Practitioners) who all work together to provide you with the care you need, when you need it.  We recommend signing up for the patient portal called "MyChart".  Sign up information is provided on this After Visit Summary.  MyChart is used to connect with patients for Virtual Visits (Telemedicine).  Patients are able to view lab/test results, encounter notes, upcoming appointments, etc.  Non-urgent messages can be sent to your provider as well.   To learn more about what you can do with MyChart, go to [email or web address].    Your next appointment:   4 week(s)  Provider:   Wallis Bamberg, NP [name])    Other Instructions When you return for your visit with Wallis Bamberg, NP in 4 weeks bring medication bottles that you are taking with you.    Signed, Flossie Dibble, NP  07/18/2022 3:55 PM    [facility]

## 2022-07-18 ENCOUNTER — Encounter: Payer: Self-pay | Admitting: Cardiology

## 2022-07-18 ENCOUNTER — Ambulatory Visit: Payer: Medicaid Other | Attending: Cardiology | Admitting: Cardiology

## 2022-07-18 VITALS — BP 146/80 | HR 64 | Ht 63.0 in | Wt 161.8 lb

## 2022-07-18 DIAGNOSIS — I25118 Atherosclerotic heart disease of native coronary artery with other forms of angina pectoris: Secondary | ICD-10-CM

## 2022-07-18 DIAGNOSIS — Z95810 Presence of automatic (implantable) cardiac defibrillator: Secondary | ICD-10-CM

## 2022-07-18 DIAGNOSIS — I255 Ischemic cardiomyopathy: Secondary | ICD-10-CM

## 2022-07-18 DIAGNOSIS — I10 Essential (primary) hypertension: Secondary | ICD-10-CM

## 2022-07-18 DIAGNOSIS — I6523 Occlusion and stenosis of bilateral carotid arteries: Secondary | ICD-10-CM

## 2022-07-18 DIAGNOSIS — I2510 Atherosclerotic heart disease of native coronary artery without angina pectoris: Secondary | ICD-10-CM

## 2022-07-18 DIAGNOSIS — I5022 Chronic systolic (congestive) heart failure: Secondary | ICD-10-CM | POA: Diagnosis not present

## 2022-07-18 MED ORDER — FUROSEMIDE 40 MG PO TABS: 40.0000 mg | ORAL_TABLET | Freq: Every day | ORAL | 0 refills | Status: DC

## 2022-07-18 MED ORDER — FUROSEMIDE 40 MG PO TABS: 40.0000 mg | ORAL_TABLET | ORAL | 3 refills | Status: AC

## 2022-07-18 MED ORDER — ENTRESTO 49-51 MG PO TABS: 1.0000 | ORAL_TABLET | Freq: Two times a day (BID) | ORAL | 3 refills | Status: AC

## 2022-07-18 MED ORDER — ENTRESTO 49-51 MG PO TABS: 1.0000 | ORAL_TABLET | Freq: Two times a day (BID) | ORAL | 0 refills | Status: DC

## 2022-07-18 NOTE — Patient Instructions (Signed)
Medication Instructions:  Start taking Furosemide 40 mg 1 tablet daily for 3 days from 07/19/22 until 07/21/22 After 07/21/22 you will take 40 mg of Furosemide on Mondays, Wednesdays, and Fridays. Entresto 49-51 mg by Tracey Meyer 2 times a day   *If you need a refill on your cardiac medications before your next appointment, please call your pharmacy*   Lab Work: BMET in 2 weeks   If you have labs (blood work) drawn today and your tests are completely normal, you will receive your results only by: MyChart Message (if you have MyChart) OR A paper copy in the mail If you have any lab test that is abnormal or we need to change your treatment, we will call you to review the results.   Testing/Procedures:   Follow-Up: At [facility], you and your health needs are our priority.  As part of our continuing mission to provide you with exceptional heart care, we have created designated Provider Care Teams.  These Care Teams include your primary Cardiologist (physician) and Advanced Practice Providers (APPs -  Physician Assistants and Nurse Practitioners) who all work together to provide you with the care you need, when you need it.  We recommend signing up for the patient portal called "MyChart".  Sign up information is provided on this After Visit Summary.  MyChart is used to connect with patients for Virtual Visits (Telemedicine).  Patients are able to view lab/test results, encounter notes, upcoming appointments, etc.  Non-urgent messages can be sent to your provider as well.   To learn more about what you can do with MyChart, go to [email or web address].    Your next appointment:   4 week(s)  Provider:   Wallis Bamberg, NP [name])    Other Instructions When you return for your visit with Wallis Bamberg, NP in 4 weeks bring medication bottles that you are taking with you.

## 2022-07-30 NOTE — Progress Notes (Signed)
Remote ICD transmission.   

## 2022-08-01 LAB — BASIC METABOLIC PANEL WITH GFR
BUN/Creatinine Ratio: 13 (ref 12–28)
BUN: 21 mg/dL (ref 8–27)
CO2: 21 mmol/L (ref 20–29)
Calcium: 10 mg/dL (ref 8.7–10.3)
Chloride: 103 mmol/L (ref 96–106)
Creatinine, Ser: 1.63 mg/dL — ABNORMAL HIGH (ref 0.57–1.00)
Glucose: 151 mg/dL — ABNORMAL HIGH (ref 70–99)
Potassium: 4.4 mmol/L (ref 3.5–5.2)
Sodium: 138 mmol/L (ref 134–144)
eGFR: 36 mL/min/1.73 — ABNORMAL LOW

## 2022-08-12 NOTE — Progress Notes (Deleted)
Cardiology Office Note:    Date:  08/12/2022   ID:  Tracey Meyer, DOB [birth year], MRN [id number]  PCP:  Maris Berger, MD   [facility] Providers Cardiologist:  Norman Herrlich, MD Electrophysiologist:  Regan Lemming, MD     Referring MD: Maris Berger, MD   CC: follow up post hospitalization  History of Present Illness:    Tracey Meyer is a 62 y.o. female with a hx of CAD (s/p DES to the LAD in 2022), ischemic cardiomyopathy, HFrEF, hypertension, embolic stroke with residual deficits, left-sided 70% ICA stenosis, DM 2, CKD stage III, hyperlipidemia, tobacco abuse, glaucoma.  She had an anterior STEMI in July 2022, s/p PCI to LAD with newly diagnosed ischemic cardiomyopathy, EF 30 to 35%.  She was readmitted a few days after initial discharge with stroke and left-sided weakness.  TEE was without thrombus.   Carotid ultrasound on 02/05/2021, right ICA mild stenosis, ECA appeared greater than 50% stenosis.  Left ICA consistent with 40 to 59% stenosis.  MPI on 12/10/2021 at [facility] which was without ischemia.  Echocardiogram on 04/08/2021 revealed EF 30 to 35% with anteroseptal marked hypokinesis to akinesis, LA moderately dilated, trivial to mild MR, mild pulmonic valve regurgitation, RVSP 37 mmHg, ascending aorta, ascending root were poorly visualized.  Evaluated by Dr. Dulce Sellar on 01/07/2022, at this time she was doing well from a cardiac perspective.  Her Entresto was increased.   Evaluated by Dr. Elberta Fortis on 03/31/2022, she was doing well at this time although her blood pressure was slightly elevated.  No changes were made at this visit.  She was admitted to [facility] for HF exacerbation.  She had a device check on 06/26/2022 for abnormal device interrogation, lead parameters and battery status were stable, periods of NSVT, her Coreg was increased to 25 mg twice daily.   She presents today by her husband for follow-up after recent  hospitalization.  There was a lot of confusion surrounding her medication regimen however she was recently evaluated by her PCP and presents the med list from their office.  From this med list, she states she has been out of Lasix for a month, has been out of [facility] for several weeks as well.  She states she is feeling okay today however she endorses some DOE and PND.  She is unable to weigh herself as she is in a wheelchair secondary to previous strokes which have left her with left-sided hemiparesis.  She is trying to watch her fluid intake however states most of her fluid is [employer]eps[employer] or [facility].  She tries to adhere to a low-sodium diet.  She denies chest pain, palpitations, dizziness.  She has pedal edema although it is better than when she was most recently admitted, she has had some instances of PND however the last few nights she has been able to sleep in a reclined position in the bed.  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   Cancer (HCC)    Cardiomyopathy, ischemic 09/24/2020   Carotid artery stenosis 2022   Chronic combined systolic and diastolic congestive heart failure (HCC)    CKD (chronic kidney disease), stage III (HCC)  09/29/2020   Cortical senile cataract of right eye 07/31/2015   DM (diabetes mellitus), type 2 (HCC) 09/24/2020   Embolic stroke (HCC) 10/09/2020   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, 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   Heart failure (HCC) 11/20/2020   HFrEF (heart failure with reduced ejection fraction) (HCC) 09/27/2020   HTN (hypertension)    Hyperlipidemia    Hypertensive urgency 11/21/2020   Hypokalemia    Left-sided  weakness    Myocardial infarction [facility])    Nuclear sclerotic cataract of both eyes 07/31/2015   Posterior vitreous detachment of right eye 07/31/2015   Prediabetes    S/P angioplasty with stent to mLAD 09/22/20  09/24/2020   ST elevation myocardial infarction involving left anterior descending (LAD) coronary artery (HCC) 09/22/2020   STEMI involving left anterior descending coronary artery (HCC) 09/22/2020   Stroke (HCC)    Tobacco abuse 09/24/2020   Transaminitis 09/24/2020    Past Surgical History:  Procedure Laterality Date   BUBBLE STUDY  10/02/2020   Procedure: BUBBLE STUDY;  Surgeon: Lewayne Bunting, MD;  Location: [facility] ENDOSCOPY;  Service: Cardiovascular;;   CORONARY/GRAFT ACUTE MI REVASCULARIZATION N/A 09/22/2020   Procedure: Coronary/Graft Acute MI Revascularization;  Surgeon: Swaziland, Peter M, MD;  Location: [facility] INVASIVE CV LAB;  Service: Cardiovascular;  Laterality: N/A;   ICD IMPLANT N/A 12/24/2021   Procedure: ICD IMPLANT;  Surgeon: Regan Lemming, MD;  Location: [facility] INVASIVE CV LAB;  Service: Cardiovascular;  Laterality: N/A;   LEFT HEART CATH AND CORONARY ANGIOGRAPHY N/A 09/22/2020   Procedure: LEFT HEART CATH AND CORONARY ANGIOGRAPHY;  Surgeon: Swaziland, Peter M, MD;  Location: [facility] INVASIVE CV LAB;  Service: Cardiovascular;  Laterality: N/A;   mastectomy Left    at age 60 due to concerns of cancer   TEE WITHOUT CARDIOVERSION N/A 10/02/2020   Procedure: TRANSESOPHAGEAL ECHOCARDIOGRAM (TEE);  Surgeon: Lewayne Bunting, MD;  Location: [facility] ENDOSCOPY;  Service: Cardiovascular;  Laterality: N/A;    Current Medications: No outpatient medications have been marked as taking for the 08/14/22 encounter (Appointment) with Flossie Dibble, NP.     Allergies:   Patient has no known allergies.   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    Comment: pending disability as of 11/21/20  Tobacco Use   Smoking status: Former    Packs/day: 0.50    Years: 45.00    Additional pack years: 0.00    Total pack years: 22.50    Types: Cigarettes    Quit date: 12/06/2021    Years since quitting: 0.6    Passive exposure: Past   Smokeless tobacco: Never   Tobacco comments:    Pt states she puffs on cigarettes but don't inhale  Vaping Use   Vaping Use: Never used  Substance and Sexual Activity   Alcohol use: Not Currently   Drug use: Never   Sexual activity: Not on file  Other Topics Concern   Not on file  Social History Narrative   Not on file   Social Determinants of Health   Financial Resource Strain: High Risk (11/30/2020)   Overall Financial Resource Strain (CARDIA)    Difficulty of Paying Living Expenses: Very hard  Food Insecurity: Food Insecurity Present (11/30/2020)  Hunger Vital Sign    Worried About Running Out of Food in the Last Year: Sometimes true    Ran Out of Food in the Last Year: Sometimes true  Transportation Needs: Unmet Transportation Needs (11/30/2020)   PRAPARE - Administrator, Civil Service (Medical): Yes    Lack of Transportation (Non-Medical): Yes  Physical Activity: Not on file  Stress: Not on file  Social Connections: Not on file     Family History: The patient's family history includes Cancer in her maternal aunt and mother; Heart disease in her brother; Lupus in her sister.  ROS:   Please see the history of present illness.     All other systems reviewed and are negative.  EKGs/Labs/Other Studies Reviewed:    The following studies were reviewed today:   EKG:  EKG is not ordered today.    Recent Labs: 01/07/2022: ALT 8; Hemoglobin 12.4; NT-Pro BNP 3,820; Platelets 275 07/31/2022: BUN 21; Creatinine, Ser 1.63; Potassium 4.4; Sodium 138  Recent Lipid Panel    Component Value Date/Time   CHOL 268 (H)  01/07/2022 1620   TRIG 152 (H) 01/07/2022 1620   HDL 49 01/07/2022 1620   CHOLHDL 5.5 (H) 01/07/2022 1620   CHOLHDL 5.2 09/28/2020 0404   VLDL 20 09/28/2020 0404   LDLCALC 191 (H) 01/07/2022 1620     Risk Assessment/Calculations:          Physical Exam:    VS:  There were no vitals taken for this visit.    Wt Readings from Last 3 Encounters:  07/18/22 161 lb 12.8 oz (73.4 kg)  06/11/22 151 lb (68.5 kg)  03/31/22 153 lb (69.4 kg)     GEN:  Well nourished, ill appearing, but no acute distress  HEENT: Normal NECK: + JVD; No carotid bruits LYMPHATICS: No lymphadenopathy CARDIAC: RRR, no murmurs, rubs, gallops RESPIRATORY:  Clear to auscultation without rales, wheezing or rhonchi  ABDOMEN: Soft, non-tender, non-distended MUSCULOSKELETAL: +1 pedal edema; No deformity  SKIN: Warm and dry NEUROLOGIC:  Alert and oriented x 3 PSYCHIATRIC:  Normal affect   ASSESSMENT:    1. Coronary artery disease involving native coronary artery of native heart without angina pectoris   2. Chronic systolic congestive heart failure (HCC)   3. ICD (implantable cardioverter-defibrillator) in place   4. Ischemic cardiomyopathy   5. Essential hypertension   6. Bilateral carotid artery stenosis     PLAN:    In order of problems listed above:  CAD-s/p DES to LAD in 2022, Stable with no anginal symptoms. No indication for ischemic evaluation.  Continue aspirin 81 mg daily, continue Coreg 25 mg twice daily, continue Plavix 75 mg daily, continue nitroglycerin as needed--has not needed, continue Crestor 40 mg daily. Chronic systolic heart failure/ICM-most recent EF 30 to 35%, s/p ICD implanted 05/24/2021. NYHA class II-III today, she is volume overloaded.  She has been out of her Lasix for approximately a month, has not taken her Sherryll Burger since her hospital discharge--the best she can recall.  Will provide her with 2 weeks of Entresto samples, will send in refill for Lasix--she is to take 40 mg every  morning times next 3 days, then proceed to her previous directions of Monday Wednesday Friday.  She is unable to weigh herself daily due to left-sided hemiparesis.  We reviewed fluid restriction, sodium restriction, and pain close attention to her respiratory status. continue Coreg 25 mg twice daily, continue Farxiga 10 mg daily, continue spironolactone 12.5 mg daily.  Labs recently  obtained by her PCP, showed stable CKD, potassium 4.0. Hypertension-blood pressure is elevated today at 146/80, however she has been out of Entresto for some time.  Will provide her with samples today per above.  Continue Coreg 25 mg twice daily, continue spironolactone 12.5 mg daily. Hyperlipidemia-LDL on 01/07/2022 elevated at 191.  We did not get to address this at this visit, will discuss in next appointment in 4 weeks.  Not clear if she is taking her statin or not as there is much confusion surrounding her medication regimen. Carotid artery stenosis-most recent imaging in 2022 revealed moderate stenosis in her left ICA, mild stenosis right ICA.  Repeat carotid ultrasound has been ordered.  Disposition-restart Entresto 49-51 mg twice daily, restart Lasix 40 mg p.o. times next 3 days, then Monday Wednesday Friday.  Will repeat BMET in 2 weeks.  She will return in 4 weeks and was encouraged to bring all of her medications with her so we can review them.  Will discuss statin use with her at this time.       Medication Adjustments/Labs and Tests Ordered: Current medicines are reviewed at length with the patient today.  Concerns regarding medicines are outlined above.  No orders of the defined types were placed in this encounter.  No orders of the defined types were placed in this encounter.   There are no Patient Instructions on file for this visit.   Signed, Flossie Dibble, NP  08/12/2022 12:50 PM    [facility]

## 2022-08-14 ENCOUNTER — Ambulatory Visit: Payer: Medicaid Other | Attending: Cardiology | Admitting: Cardiology

## 2022-08-14 DIAGNOSIS — I6523 Occlusion and stenosis of bilateral carotid arteries: Secondary | ICD-10-CM

## 2022-08-14 DIAGNOSIS — Z95810 Presence of automatic (implantable) cardiac defibrillator: Secondary | ICD-10-CM

## 2022-08-14 DIAGNOSIS — I2510 Atherosclerotic heart disease of native coronary artery without angina pectoris: Secondary | ICD-10-CM

## 2022-08-14 DIAGNOSIS — I10 Essential (primary) hypertension: Secondary | ICD-10-CM

## 2022-08-14 DIAGNOSIS — I255 Ischemic cardiomyopathy: Secondary | ICD-10-CM

## 2022-08-14 DIAGNOSIS — I5022 Chronic systolic (congestive) heart failure: Secondary | ICD-10-CM

## 2022-09-24 ENCOUNTER — Ambulatory Visit (INDEPENDENT_AMBULATORY_CARE_PROVIDER_SITE_OTHER): Payer: Medicaid Other

## 2022-09-24 DIAGNOSIS — I255 Ischemic cardiomyopathy: Secondary | ICD-10-CM | POA: Diagnosis not present

## 2022-09-26 ENCOUNTER — Other Ambulatory Visit: Payer: Self-pay | Admitting: Cardiology

## 2022-09-26 DIAGNOSIS — Z95810 Presence of automatic (implantable) cardiac defibrillator: Secondary | ICD-10-CM

## 2022-09-26 LAB — CUP PACEART REMOTE DEVICE CHECK
Battery Remaining Longevity: 115 mo
Battery Remaining Percentage: 93 %
Battery Voltage: 3.04 V
Brady Statistic RV Percent Paced: 1 %
Date Time Interrogation Session: 20240704020114
HighPow Impedance: 64 Ohm
Implantable Lead Connection Status: 753985
Implantable Lead Implant Date: 20231003
Implantable Lead Location: 753860
Implantable Pulse Generator Implant Date: 20231003
Lead Channel Impedance Value: 450 Ohm
Lead Channel Pacing Threshold Amplitude: 0.5 V
Lead Channel Pacing Threshold Pulse Width: 0.5 ms
Lead Channel Sensing Intrinsic Amplitude: 11.8 mV
Lead Channel Setting Pacing Amplitude: 2.5 V
Lead Channel Setting Pacing Pulse Width: 0.5 ms
Lead Channel Setting Sensing Sensitivity: 0.5 mV
Pulse Gen Serial Number: 211008330
Zone Setting Status: 755011

## 2022-10-16 NOTE — Progress Notes (Signed)
Remote ICD transmission.   

## 2022-11-05 DIAGNOSIS — I5023 Acute on chronic systolic (congestive) heart failure: Secondary | ICD-10-CM | POA: Diagnosis not present

## 2022-11-05 DIAGNOSIS — I361 Nonrheumatic tricuspid (valve) insufficiency: Secondary | ICD-10-CM | POA: Diagnosis not present

## 2022-12-24 ENCOUNTER — Ambulatory Visit (INDEPENDENT_AMBULATORY_CARE_PROVIDER_SITE_OTHER): Payer: Medicaid Other

## 2022-12-24 DIAGNOSIS — I255 Ischemic cardiomyopathy: Secondary | ICD-10-CM | POA: Diagnosis not present

## 2022-12-25 LAB — CUP PACEART REMOTE DEVICE CHECK
Battery Remaining Longevity: 113 mo
Battery Remaining Percentage: 91 %
Battery Voltage: 3.04 V
Brady Statistic RV Percent Paced: 1 %
Date Time Interrogation Session: 20241003021855
HighPow Impedance: 64 Ohm
Implantable Lead Connection Status: 753985
Implantable Lead Implant Date: 20231003
Implantable Lead Location: 753860
Implantable Pulse Generator Implant Date: 20231003
Lead Channel Impedance Value: 500 Ohm
Lead Channel Pacing Threshold Amplitude: 0.5 V
Lead Channel Pacing Threshold Pulse Width: 0.5 ms
Lead Channel Sensing Intrinsic Amplitude: 11.8 mV
Lead Channel Setting Pacing Amplitude: 2.5 V
Lead Channel Setting Pacing Pulse Width: 0.5 ms
Lead Channel Setting Sensing Sensitivity: 0.5 mV
Pulse Gen Serial Number: 211008330
Zone Setting Status: 755011

## 2023-01-09 NOTE — Progress Notes (Signed)
Remote ICD transmission.   

## 2023-01-19 NOTE — Progress Notes (Deleted)
Cardiology Office Note:    Date:  01/19/2023   ID:  Tracey Meyer, DOB Aug 20, 1960, MRN [id number]  PCP:  Maris Berger, MD   [facility] Providers Cardiologist:  Norman Herrlich, MD Electrophysiologist:  Regan Lemming, MD     Referring MD: Maris Berger, MD   CC: follow up post hospitalization  History of Present Illness:    Tracey Meyer is a 62 y.o. female with a hx of CAD (s/p DES to the LAD in 2022), ischemic cardiomyopathy, HFrEF, aortic stenosis, hypertension, embolic stroke with residual deficits, left-sided 70% ICA stenosis, DM 2, CKD stage III, hyperlipidemia, tobacco abuse, glaucoma.  11/05/22 echo EF 25 to 30%, restrictive pattern akinetic wall motion, trivial to mild MR, moderate aortic stenosis, PASP severely elevated at 70 mmHg 12/10/2021 MPI negative for ischemia 03/29/2021 EF 30 to 35%, anteroseptal moderate hypokinesis to akinesis, LA moderately dilated, trivial to mild MR, mild PR 02/05/2021 carotid ultrasound right ICA mild stenosis, ECA stenosis greater than 50%, left ICA consistent with 40 to 49% stenosis 10/10/2020 anterior STEMI s/p PCI to LAD with newly diagnosed ischemic cardiomyopathy EF 30 to 35%   She was admitted to [facility] for HF exacerbation.  She had a device check on 06/26/2022 for abnormal device interrogation, lead parameters and battery status were stable, periods of NSVT, her Coreg was increased to 25 mg twice daily.   She presents today by her husband for follow-up after recent hospitalization.  There was a lot of confusion surrounding her medication regimen however she was recently evaluated by her PCP and presents the med list from their office.  From this med list, she states she has been out of Lasix for a month, has been out of [facility] for several weeks as well.  She states she is feeling okay today however she endorses some DOE and PND.  She is unable to weigh herself as she is in a wheelchair secondary to  previous strokes which have left her with left-sided hemiparesis.  She is trying to watch her fluid intake however states most of her fluid is [employer]eps[employer] or [facility].  She tries to adhere to a low-sodium diet.  She denies chest pain, palpitations, dizziness.  She has pedal edema although it is better than when she was most recently admitted, she has had some instances of PND however the last few nights she has been able to sleep in a reclined position in the bed.  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   Cancer (HCC)    Cardiomyopathy, ischemic 09/24/2020   Carotid artery stenosis 2022   Chronic combined systolic and diastolic congestive heart failure (HCC)    CKD (chronic kidney disease), stage III (HCC) 09/29/2020   Cortical senile cataract of right eye 07/31/2015   DM (diabetes mellitus), type 2 (HCC) 09/24/2020   Embolic stroke (HCC) 10/09/2020   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, 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   Heart failure (HCC) 11/20/2020   HFrEF (heart failure with reduced ejection fraction) (HCC) 09/27/2020   HTN (hypertension)    Hyperlipidemia    Hypertensive urgency 11/21/2020   Hypokalemia  Left-sided weakness    Myocardial infarction [facility])    Nuclear sclerotic cataract of both eyes 07/31/2015   Posterior vitreous detachment of right eye 07/31/2015   Prediabetes    S/P angioplasty with stent to mLAD 09/22/20  09/24/2020   ST elevation myocardial infarction involving left anterior descending (LAD) coronary artery (HCC) 09/22/2020   STEMI involving left anterior descending coronary artery (HCC) 09/22/2020   Stroke (HCC)     Tobacco abuse 09/24/2020   Transaminitis 09/24/2020    Past Surgical History:  Procedure Laterality Date   BUBBLE STUDY  10/02/2020   Procedure: BUBBLE STUDY;  Surgeon: Lewayne Bunting, MD;  Location: [facility] ENDOSCOPY;  Service: Cardiovascular;;   CORONARY/GRAFT ACUTE MI REVASCULARIZATION N/A 09/22/2020   Procedure: Coronary/Graft Acute MI Revascularization;  Surgeon: Swaziland, Peter M, MD;  Location: [facility] INVASIVE CV LAB;  Service: Cardiovascular;  Laterality: N/A;   ICD IMPLANT N/A 12/24/2021   Procedure: ICD IMPLANT;  Surgeon: Regan Lemming, MD;  Location: [facility] INVASIVE CV LAB;  Service: Cardiovascular;  Laterality: N/A;   LEFT HEART CATH AND CORONARY ANGIOGRAPHY N/A 09/22/2020   Procedure: LEFT HEART CATH AND CORONARY ANGIOGRAPHY;  Surgeon: Swaziland, Peter M, MD;  Location: [facility] INVASIVE CV LAB;  Service: Cardiovascular;  Laterality: N/A;   mastectomy Left    at age 55 due to concerns of cancer   TEE WITHOUT CARDIOVERSION N/A 10/02/2020   Procedure: TRANSESOPHAGEAL ECHOCARDIOGRAM (TEE);  Surgeon: Lewayne Bunting, MD;  Location: [facility] ENDOSCOPY;  Service: Cardiovascular;  Laterality: N/A;    Current Medications: No outpatient medications have been marked as taking for the 01/20/23 encounter (Appointment) with Flossie Dibble, NP.     Allergies:   Patient has no known allergies.   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    Comment: pending disability as of 11/21/20  Tobacco Use   Smoking status: Former    Current packs/day: 0.00    Average packs/day: 0.5 packs/day for 45.0 years (22.5 ttl pk-yrs)    Types: Cigarettes    Start date: 12/06/1976    Quit date: 12/06/2021    Years since quitting: 1.1    Passive exposure: Past   Smokeless tobacco: Never   Tobacco  comments:    Pt states she puffs on cigarettes but don't inhale  Vaping Use   Vaping status: Never Used  Substance and Sexual Activity   Alcohol use: Not Currently   Drug use: Never   Sexual activity: Not on file  Other Topics Concern   Not on file  Social History Narrative   Not on file   Social Determinants of Health   Financial Resource Strain: High Risk (11/30/2020)   Overall Financial Resource Strain (CARDIA)    Difficulty of Paying Living Expenses: Very hard  Food Insecurity: Food Insecurity Present (11/30/2020)   Hunger Vital Sign    Worried About Running Out of Food in the Last Year: Sometimes true    Ran Out of Food in the Last Year: Sometimes true  Transportation Needs: Unmet Transportation Needs (11/30/2020)   PRAPARE - Administrator, Civil Service (Medical): Yes    Lack of Transportation (Non-Medical): Yes  Physical Activity: Not on file  Stress: Not on file  Social Connections: Not  on file     Family History: The patient's family history includes Cancer in her maternal aunt and mother; Heart disease in her brother; Lupus in her sister.  ROS:   Please see the history of present illness.     All other systems reviewed and are negative.  EKGs/Labs/Other Studies Reviewed:    The following studies were reviewed today:   EKG:  EKG is not ordered today.    Recent Labs: 07/31/2022: BUN 21; Creatinine, Ser 1.63; Potassium 4.4; Sodium 138  Recent Lipid Panel    Component Value Date/Time   CHOL 268 (H) 01/07/2022 1620   TRIG 152 (H) 01/07/2022 1620   HDL 49 01/07/2022 1620   CHOLHDL 5.5 (H) 01/07/2022 1620   CHOLHDL 5.2 09/28/2020 0404   VLDL 20 09/28/2020 0404   LDLCALC 191 (H) 01/07/2022 1620     Risk Assessment/Calculations:          Physical Exam:    VS:  There were no vitals taken for this visit.    Wt Readings from Last 3 Encounters:  07/18/22 161 lb 12.8 oz (73.4 kg)  06/11/22 151 lb (68.5 kg)  03/31/22 153 lb (69.4 kg)     GEN:   Well nourished, ill appearing, but no acute distress  HEENT: Normal NECK: + JVD; No carotid bruits LYMPHATICS: No lymphadenopathy CARDIAC: RRR, no murmurs, rubs, gallops RESPIRATORY:  Clear to auscultation without rales, wheezing or rhonchi  ABDOMEN: Soft, non-tender, non-distended MUSCULOSKELETAL: +1 pedal edema; No deformity  SKIN: Warm and dry NEUROLOGIC:  Alert and oriented x 3 PSYCHIATRIC:  Normal affect   ASSESSMENT:    No diagnosis found.  PLAN:    In order of problems listed above:  CAD-s/p DES to LAD in 2022, Stable with no anginal symptoms. No indication for ischemic evaluation.  Continue aspirin 81 mg daily, continue Coreg 25 mg twice daily, continue Plavix 75 mg daily, continue nitroglycerin as needed--has not needed, continue Crestor 40 mg daily. Chronic systolic heart failure/ICM-most recent EF 30 to 35%, s/p ICD implanted 05/24/2021. NYHA class II-III today, she is volume overloaded.  She has been out of her Lasix for approximately a month, has not taken her Sherryll Burger since her hospital discharge--the best she can recall.  Will provide her with 2 weeks of Entresto samples, will send in refill for Lasix--she is to take 40 mg every morning times next 3 days, then proceed to her previous directions of Monday Wednesday Friday.  She is unable to weigh herself daily due to left-sided hemiparesis.  We reviewed fluid restriction, sodium restriction, and pain close attention to her respiratory status. continue Coreg 25 mg twice daily, continue Farxiga 10 mg daily, continue spironolactone 12.5 mg daily.  Labs recently obtained by her PCP, showed stable CKD, potassium 4.0. Hypertension-blood pressure is elevated today at 146/80, however she has been out of Entresto for some time.  Will provide her with samples today per above.  Continue Coreg 25 mg twice daily, continue spironolactone 12.5 mg daily. Hyperlipidemia-LDL on 01/07/2022 elevated at 191.  We did not get to address this at this  visit, will discuss in next appointment in 4 weeks.  Not clear if she is taking her statin or not as there is much confusion surrounding her medication regimen. Carotid artery stenosis-most recent imaging in 2022 revealed moderate stenosis in her left ICA, mild stenosis right ICA.  Repeat carotid ultrasound has been ordered.  Disposition-restart Entresto 49-51 mg twice daily, restart Lasix 40 mg p.o. times next 3  days, then Monday Wednesday Friday.  Will repeat BMET in 2 weeks.  She will return in 4 weeks and was encouraged to bring all of her medications with her so we can review them.  Will discuss statin use with her at this time.       Medication Adjustments/Labs and Tests Ordered: Current medicines are reviewed at length with the patient today.  Concerns regarding medicines are outlined above.  No orders of the defined types were placed in this encounter.  No orders of the defined types were placed in this encounter.   There are no Patient Instructions on file for this visit.   Signed, Flossie Dibble, NP  01/19/2023 10:03 AM    [facility]

## 2023-01-20 ENCOUNTER — Ambulatory Visit: Payer: Medicaid Other | Admitting: Cardiology

## 2023-01-21 NOTE — Progress Notes (Unsigned)
Cardiology Office Note:    Date:  01/22/2023   ID:  LAKEILA GRUNDEN, DOB 03-06-1961, MRN [id number]  PCP:  Maris Berger, MD   [facility] Providers Cardiologist:  Norman Herrlich, MD Electrophysiologist:  Regan Lemming, MD     Referring MD: Maris Berger, MD   CC: follow up post hospitalization  History of Present Illness:    Tracey Meyer is a 62 y.o. female with a hx of CAD (s/p DES to the LAD in 2022), ischemic cardiomyopathy, HFrEF, aortic stenosis, hypertension, embolic stroke with residual deficits, left-sided 70% ICA stenosis, DM 2, CKD stage III, hyperlipidemia, tobacco abuse, glaucoma.  11/05/22 echo EF 25 to 30%, restrictive pattern akinetic wall motion, trivial to mild MR, moderate aortic stenosis, PASP severely elevated at 70 mmHg 12/10/2021 MPI negative for ischemia 03/29/2021 EF 30 to 35%, anteroseptal moderate hypokinesis to akinesis, LA moderately dilated, trivial to mild MR, mild PR 02/05/2021 carotid ultrasound right ICA mild stenosis, ECA stenosis greater than 50%, left ICA consistent with 40 to 49% stenosis 10/10/2020 anterior STEMI s/p PCI to LAD with newly diagnosed ischemic cardiomyopathy EF 30 to 35%  She presents today for follow-up after hospitalization in August for heart failure exacerbation, repeat echocardiogram at that time revealed an EF of 25 to 30%.  She states she is doing well, offers no formal complaints today, she is working with physical therapy twice a week, is hopeful to get some of her mobility back following her previous stroke.  She is weighing herself daily.  Her blood pressure is well-controlled. She denies chest pain, palpitations, dyspnea, pnd, orthopnea, n, v, dizziness, syncope, edema, weight gain, or early satiety.   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   Cancer (HCC)    Cardiomyopathy, ischemic 09/24/2020   Carotid artery stenosis 2022   Chronic combined systolic and diastolic congestive heart failure (HCC)    CKD (chronic kidney disease), stage III (HCC) 09/29/2020   Cortical senile cataract of right eye 07/31/2015   DM (diabetes mellitus), type 2 (HCC) 09/24/2020   Embolic stroke (HCC) 10/09/2020   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, 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   Heart failure (HCC) 11/20/2020   HFrEF (heart failure with reduced ejection fraction) (HCC) 09/27/2020   HTN (hypertension)    Hyperlipidemia    Hypertensive urgency 11/21/2020   Hypokalemia    Left-sided weakness    Myocardial infarction [facility])    Nuclear sclerotic cataract of both eyes 07/31/2015   Posterior vitreous detachment of right eye 07/31/2015   Prediabetes    S/P angioplasty with stent to mLAD 09/22/20  09/24/2020   ST elevation myocardial infarction involving left anterior descending (LAD) coronary artery (HCC) 09/22/2020   STEMI involving left anterior descending coronary artery (HCC) 09/22/2020   Stroke (HCC)    Tobacco abuse 09/24/2020   Transaminitis 09/24/2020    Past Surgical History:  Procedure Laterality Date   BUBBLE STUDY  10/02/2020   Procedure: BUBBLE STUDY;  Surgeon: Lewayne Bunting, MD;  Location: [facility] ENDOSCOPY;  Service: Cardiovascular;;   CORONARY/GRAFT ACUTE MI REVASCULARIZATION N/A 09/22/2020   Procedure: Coronary/Graft Acute MI Revascularization;  Surgeon: Swaziland, Peter M, MD;  Location: [facility] INVASIVE CV LAB;  Service: Cardiovascular;  Laterality: N/A;   ICD IMPLANT N/A 12/24/2021   Procedure: ICD IMPLANT;  Surgeon: Regan Lemming, MD;  Location: [facility] INVASIVE CV LAB;  Service: Cardiovascular;  Laterality: N/A;   LEFT HEART CATH AND CORONARY ANGIOGRAPHY N/A  09/22/2020   Procedure: LEFT HEART CATH AND CORONARY ANGIOGRAPHY;  Surgeon: Swaziland, Peter M, MD;  Location: [facility] INVASIVE CV LAB;  Service: Cardiovascular;  Laterality: N/A;   mastectomy Left    at age 48 due to concerns of cancer   TEE WITHOUT CARDIOVERSION N/A 10/02/2020   Procedure: TRANSESOPHAGEAL ECHOCARDIOGRAM (TEE);  Surgeon: Lewayne Bunting, MD;  Location: [facility] ENDOSCOPY;  Service: Cardiovascular;  Laterality: N/A;    Current Medications: Current Meds  Medication Sig   acetaminophen (TYLENOL) 325 MG tablet Take 2 tablets (650 mg total) by mouth every 4 (four) hours as needed for headache or mild pain.   aspirin EC 81 MG tablet Take 81 mg by mouth daily. Swallow whole.   carvedilol (COREG) 25 MG tablet TAKE 1 TABLET BY MOUTH TWICE A DAY   clopidogrel (PLAVIX) 75 MG tablet Take 1 tablet (75 mg total) by mouth daily.   dapagliflozin propanediol (FARXIGA) 10 MG TABS tablet Take 1 tablet (10 mg total) by mouth daily.   furosemide (LASIX) 40 MG tablet Take 1 tablet (40 mg total) by mouth daily. Staring 07/19/22 until 07/21/22   furosemide (LASIX) 40 MG tablet Take 1 tablet (40 mg total) by mouth 3 (three) times a week. M-W-F   ondansetron (ZOFRAN-ODT) 4 MG disintegrating tablet Take 4 mg by mouth every 6 (six) hours as needed for nausea.   promethazine-dextromethorphan (PROMETHAZINE-DM) 6.25-15 MG/5ML syrup Take 5 mLs by mouth every 6 (six) hours as needed for cough.   rosuvastatin (CRESTOR) 40 MG tablet Take 1 tablet (40 mg total) by mouth daily.   sacubitril-valsartan (ENTRESTO) 49-51 MG Take 1 tablet by mouth 2 (two) times daily.   sacubitril-valsartan (ENTRESTO) 49-51 MG Take 1 tablet by mouth 2 (two) times daily.   spironolactone (ALDACTONE) 25 MG tablet Take 0.5 tablets (12.5 mg total) by mouth daily.   VENTOLIN HFA 108 (90 Base) MCG/ACT inhaler Inhale 2 puffs into the lungs every 6 (six) hours as needed for wheezing or shortness of breath.   [DISCONTINUED] nitroGLYCERIN (NITROSTAT) 0.4  MG SL tablet Place 1 tablet (0.4 mg total) under the tongue every 5 (five) minutes x 3 doses as needed for chest pain.     Allergies:   Patient has no known allergies.   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    Comment: pending disability as of 11/21/20  Tobacco Use   Smoking status: Former    Current packs/day: 0.00    Average packs/day: 0.5 packs/day for 45.0 years (22.5 ttl pk-yrs)    Types: Cigarettes    Start date: 12/06/1976    Quit date: 12/06/2021    Years since quitting: 1.1    Passive exposure: Past   Smokeless tobacco: Never   Tobacco comments:    Pt states she puffs on cigarettes but don't inhale  Vaping Use   Vaping status: Never Used  Substance and Sexual Activity   Alcohol use: Not Currently   Drug use:  Never   Sexual activity: Not on file  Other Topics Concern   Not on file  Social History Narrative   Not on file   Social Determinants of Health   Financial Resource Strain: High Risk (11/30/2020)   Overall Financial Resource Strain (CARDIA)    Difficulty of Paying Living Expenses: Very hard  Food Insecurity: Food Insecurity Present (11/30/2020)   Hunger Vital Sign    Worried About Running Out of Food in the Last Year: Sometimes true    Ran Out of Food in the Last Year: Sometimes true  Transportation Needs: Unmet Transportation Needs (11/30/2020)   PRAPARE - Administrator, Civil Service (Medical): Yes    Lack of Transportation (Non-Medical): Yes  Physical Activity: Not on file  Stress: Not on file  Social Connections: Not on file     Family History: The patient's family history includes Cancer in her maternal aunt and mother; Heart disease in her brother; Lupus in her sister.  ROS:   Please see the history of present illness.      All other systems reviewed and are negative.  EKGs/Labs/Other Studies Reviewed:    The following studies were reviewed today:   EKG:  EKG is not ordered today.    Recent Labs: 07/31/2022: BUN 21; Creatinine, Ser 1.63; Potassium 4.4; Sodium 138  Recent Lipid Panel    Component Value Date/Time   CHOL 268 (H) 01/07/2022 1620   TRIG 152 (H) 01/07/2022 1620   HDL 49 01/07/2022 1620   CHOLHDL 5.5 (H) 01/07/2022 1620   CHOLHDL 5.2 09/28/2020 0404   VLDL 20 09/28/2020 0404   LDLCALC 191 (H) 01/07/2022 1620     Risk Assessment/Calculations:          Physical Exam:    VS:  BP 108/60 (BP Location: Left Arm, Patient Position: Sitting)   Pulse (!) 58   Ht 5\' 3"  (1.6 m)   Wt 155 lb (70.3 kg)   PF 97 L/min   BMI 27.46 kg/m     Wt Readings from Last 3 Encounters:  01/22/23 155 lb (70.3 kg)  07/18/22 161 lb 12.8 oz (73.4 kg)  06/11/22 151 lb (68.5 kg)     GEN:  Well nourished, ill appearing, but no acute distress  HEENT: Normal NECK: + JVD; No carotid bruits LYMPHATICS: No lymphadenopathy CARDIAC: RRR, no murmurs, rubs, gallops RESPIRATORY:  Clear to auscultation without rales, wheezing or rhonchi  ABDOMEN: Soft, non-tender, non-distended MUSCULOSKELETAL: trace pedal edema; No deformity  SKIN: Warm and dry NEUROLOGIC:  Alert and oriented x 3 PSYCHIATRIC:  Normal affect   ASSESSMENT:    1. Coronary artery disease involving native coronary artery of native heart without angina pectoris   2. Ischemic cardiomyopathy   3. ICD (implantable cardioverter-defibrillator) in place   4. Essential hypertension   5. Bilateral carotid artery stenosis   6. Cardiac defibrillator in situ     PLAN:    In order of problems listed above:  CAD-s/p DES to LAD in 2022, Stable with no anginal symptoms. No indication for ischemic evaluation.  Continue aspirin 81 mg daily, continue Coreg 25 mg twice daily, continue Plavix 75 mg daily, continue nitroglycerin as needed--has not needed,  continue Crestor 40 mg daily. Chronic systolic heart failure/ICM-most recent EF 25-30%, s/p ICD implanted 05/24/2021. NYHA class II today, euvolemic.  Continue Coreg 25 mg twice daily, continue Farxiga 10 mg daily, continue Lasix 40 mg daily, continue Entresto 49-51 mg twice daily, continue spironolactone 12.5 mg daily.  She had lab work within the last 2 weeks at her PCP office, will request those labs to be sent to our office. Hypertension-blood pressure is well-controlled today at 108/60, continue Entresto 45-51 mg twice daily, continue Coreg 25 mg twice daily. Hyperlipidemia-LDL on 01/07/2022 elevated at 191.  Will request her most recent lipid panel from her PCP office. Carotid artery stenosis-most recent imaging in 2022 revealed moderate stenosis in her left ICA, mild stenosis right ICA.  Repeat carotid ultrasound has been ordered.  Disposition-request lab work from PCP, follow-up in 3 months.       Medication Adjustments/Labs and Tests Ordered: Current medicines are reviewed at length with the patient today.  Concerns regarding medicines are outlined above.  Orders Placed This Encounter  Procedures   EKG 12-Lead   Meds ordered this encounter  Medications   nitroGLYCERIN (NITROSTAT) 0.4 MG SL tablet    Sig: Place 1 tablet (0.4 mg total) under the tongue every 5 (five) minutes x 3 doses as needed for chest pain.    Dispense:  25 tablet    Refill:  6    Order Specific Question:   Supervising Provider    Answer:   Georgeanna Lea [[id number]]    Patient Instructions  Medication Instructions:    Refill Nitroglycerin for chest pain.   *If you need a refill on your cardiac medications before your next appointment, please call your pharmacy*   Lab Work:   None today, you had some with PCP two weeks ago and we will request copy sent to our office.   If you have labs (blood work) drawn today and your tests are completely normal, you will receive your results only by: MyChart  Message (if you have MyChart) OR A paper copy in the mail If you have any lab test that is abnormal or we need to change your treatment, we will call you to review the results.   Testing/Procedures:  None   Follow-Up: At [facility], you and your health needs are our priority.  As part of our continuing mission to provide you with exceptional heart care, we have created designated Provider Care Teams.  These Care Teams include your primary Cardiologist (physician) and Advanced Practice Providers (APPs -  Physician Assistants and Nurse Practitioners) who all work together to provide you with the care you need, when you need it.  We recommend signing up for the patient portal called "MyChart".  Sign up information is provided on this After Visit Summary.  MyChart is used to connect with patients for Virtual Visits (Telemedicine).  Patients are able to view lab/test results, encounter notes, upcoming appointments, etc.  Non-urgent messages can be sent to your provider as well.   To learn more about what you can do with MyChart, go to [email or web address].    Your next appointment:   3 month(s)  Provider:   Norman Herrlich, MD    Other Instructions  Weigh daily. For weight gain of 3 lbs in one day, call our office or your PCP and we may have you take an additional lasix.     Signed, Flossie Dibble, NP  01/22/2023 9:03 AM    [facility]

## 2023-01-22 ENCOUNTER — Ambulatory Visit: Payer: Medicaid Other | Attending: Cardiology | Admitting: Cardiology

## 2023-01-22 ENCOUNTER — Encounter: Payer: Self-pay | Admitting: Cardiology

## 2023-01-22 VITALS — BP 108/60 | HR 58 | Ht 63.0 in | Wt 155.0 lb

## 2023-01-22 DIAGNOSIS — I2510 Atherosclerotic heart disease of native coronary artery without angina pectoris: Secondary | ICD-10-CM | POA: Diagnosis not present

## 2023-01-22 DIAGNOSIS — Z95810 Presence of automatic (implantable) cardiac defibrillator: Secondary | ICD-10-CM

## 2023-01-22 DIAGNOSIS — I255 Ischemic cardiomyopathy: Secondary | ICD-10-CM | POA: Diagnosis not present

## 2023-01-22 DIAGNOSIS — I10 Essential (primary) hypertension: Secondary | ICD-10-CM

## 2023-01-22 DIAGNOSIS — I6523 Occlusion and stenosis of bilateral carotid arteries: Secondary | ICD-10-CM

## 2023-01-22 MED ORDER — NITROGLYCERIN 0.4 MG SL SUBL: 0.4000 mg | SUBLINGUAL_TABLET | SUBLINGUAL | 6 refills | Status: DC | PRN

## 2023-01-22 NOTE — Patient Instructions (Signed)
Medication Instructions:    Refill Nitroglycerin for chest pain.   *If you need a refill on your cardiac medications before your next appointment, please call your pharmacy*   Lab Work:   None today, you had some with PCP two weeks ago and we will request copy sent to our office.   If you have labs (blood work) drawn today and your tests are completely normal, you will receive your results only by: MyChart Message (if you have MyChart) OR A paper copy in the mail If you have any lab test that is abnormal or we need to change your treatment, we will call you to review the results.   Testing/Procedures:  None   Follow-Up: At [facility], you and your health needs are our priority.  As part of our continuing mission to provide you with exceptional heart care, we have created designated Provider Care Teams.  These Care Teams include your primary Cardiologist (physician) and Advanced Practice Providers (APPs -  Physician Assistants and Nurse Practitioners) who all work together to provide you with the care you need, when you need it.  We recommend signing up for the patient portal called "MyChart".  Sign up information is provided on this After Visit Summary.  MyChart is used to connect with patients for Virtual Visits (Telemedicine).  Patients are able to view lab/test results, encounter notes, upcoming appointments, etc.  Non-urgent messages can be sent to your provider as well.   To learn more about what you can do with MyChart, go to [email or web address].    Your next appointment:   3 month(s)  Provider:   Norman Herrlich, MD    Other Instructions  Weigh daily. For weight gain of 3 lbs in one day, call our office or your PCP and we may have you take an additional lasix.

## 2023-03-24 ENCOUNTER — Ambulatory Visit (INDEPENDENT_AMBULATORY_CARE_PROVIDER_SITE_OTHER): Payer: Medicaid Other

## 2023-03-24 DIAGNOSIS — I5022 Chronic systolic (congestive) heart failure: Secondary | ICD-10-CM

## 2023-03-24 DIAGNOSIS — I255 Ischemic cardiomyopathy: Secondary | ICD-10-CM | POA: Diagnosis not present

## 2023-03-29 LAB — CUP PACEART REMOTE DEVICE CHECK
Battery Remaining Longevity: 112 mo
Battery Remaining Percentage: 90 %
Battery Voltage: 3.02 V
Brady Statistic RV Percent Paced: 1 %
Date Time Interrogation Session: 20250103144030
HighPow Impedance: 65 Ohm
Implantable Lead Connection Status: 753985
Implantable Lead Implant Date: 20231003
Implantable Lead Location: 753860
Implantable Pulse Generator Implant Date: 20231003
Lead Channel Impedance Value: 550 Ohm
Lead Channel Pacing Threshold Amplitude: 0.5 V
Lead Channel Pacing Threshold Pulse Width: 0.5 ms
Lead Channel Sensing Intrinsic Amplitude: 11.8 mV
Lead Channel Setting Pacing Amplitude: 2.5 V
Lead Channel Setting Pacing Pulse Width: 0.5 ms
Lead Channel Setting Sensing Sensitivity: 0.5 mV
Pulse Gen Serial Number: 211008330
Zone Setting Status: 755011

## 2023-04-23 ENCOUNTER — Encounter: Payer: Self-pay | Admitting: Cardiology

## 2023-04-23 NOTE — Progress Notes (Signed)
Cardiology Office Note:    Date:  04/24/2023   ID:  Tracey Meyer, DOB 1960/11/04, MRN [id number]  PCP:  Maris Berger, MD  Cardiologist:  Norman Herrlich, MD    Referring MD: Maris Berger, MD    ASSESSMENT:    1. Ischemic cardiomyopathy   2. Chronic systolic congestive heart failure (HCC)   3. Hypertensive heart and chronic kidney disease with heart failure and stage 1 through stage 4 chronic kidney disease, or chronic kidney disease (HCC)   4. ICD (implantable cardioverter-defibrillator) in place   5. Coronary artery disease of native artery of native heart with stable angina pectoris (HCC)   6. Mixed hyperlipidemia    PLAN:    In order of problems listed above:  Tracey Meyer continues to do well with her cardiomyopathy and heart failure compensated without fluid overload she will continue her loop diuretic furosemide 40 mg 3 times a week along with Farxiga 10 mg daily Entresto 49-51 daily spironolactone and carvedilol optimal dose 25 mg twice daily BP at target on current guidelines for directed treatment including amlodipine 5 mg daily I requested recent labs for her CKD Stable ICD function followed in our device clinic Stable CAD after previous PCI having no anginal discomfort and on good medical therapy including aspirin clopidogrel intensity statin beta-blocker calcium channel blocker.  I do not think she requires an ischemia evaluation at this time Continue her current statin rosuvastatin 40 mg daily and again request her recent labs she may require a second agent to achieve target LDL less than 55   Next appointment: 6 months   Medication Adjustments/Labs and Tests Ordered: Current medicines are reviewed at length with the patient today.  Concerns regarding medicines are outlined above.  No orders of the defined types were placed in this encounter.  No orders of the defined types were placed in this encounter.    History of Present Illness:    Tracey Meyer is a 63 y.o. female with a hx of CAD with drug-eluting stent to the left anterior descending coronary artery 2022 ischemic cardiomyopathy with heart failure initially severely reduced ejection fraction with most recent ejection fraction 25 to 30% August 2014 aortic stenosis hypertensive heart disease embolic stroke with residual deficits left ICA stenosis 70% diabetes stage III CKD and hyper lipidemia  last seen 01/22/2023.  An ICD implanted 05/24/2021 with CAD heart failure and severely reduced ejection fraction less than 35%.  Compliance with diet, lifestyle and medications: Yes  She is a good relationship with her primary care physician tells me she has had recent labs and I will request a copy. She tolerates her multiple guideline directed heart failure medications without lightheadedness and is not having edema shortness of breath orthopnea palpitation or syncope She follows in our device clinic she has had no VT VF therapy She is on a high intensity statin with no muscle pain or weakness She continues on aspirin and clopidogrel without GI upset.  Her device checks were 103 2025 showed a projected battery life ICD of 112 months normal device and lead function and she had received no recent VT VF therapy. 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  Cancer (HCC)    Cardiomyopathy, ischemic 09/24/2020   Carotid artery stenosis 2022   Chronic combined systolic and diastolic congestive heart failure (HCC)    CKD (chronic kidney disease), stage III (HCC) 09/29/2020   Cortical senile cataract of right eye 07/31/2015   DM (diabetes mellitus), type 2 (HCC) 09/24/2020    Embolic stroke (HCC) 10/09/2020   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, 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   Heart failure (HCC) 11/20/2020   HFrEF (heart failure with reduced ejection fraction) (HCC) 09/27/2020   HTN (hypertension)    Hyperlipidemia    Hypertensive urgency 11/21/2020   Hypokalemia    Left-sided weakness    Myocardial infarction [facility])    Nuclear sclerotic cataract of both eyes 07/31/2015   Posterior vitreous detachment of right eye 07/31/2015   Prediabetes    S/P angioplasty with stent to mLAD 09/22/20  09/24/2020   ST elevation myocardial infarction involving left anterior descending (LAD) coronary artery (HCC) 09/22/2020   STEMI involving left anterior descending coronary artery (HCC) 09/22/2020   Stroke (HCC)    Tobacco abuse 09/24/2020   Transaminitis 09/24/2020    Current Medications: Current Meds  Medication Sig   acetaminophen (TYLENOL) 325 MG tablet Take 2 tablets (650 mg total) by mouth every 4 (four) hours as needed for headache or mild pain.   amLODipine (NORVASC) 5 MG tablet Take 5 mg by mouth daily.   aspirin EC 81 MG tablet Take 81 mg by mouth daily. Swallow whole.   atorvastatin (LIPITOR) 40 MG tablet Take 40 mg by mouth at bedtime.   carvedilol (COREG) 25 MG tablet TAKE 1 TABLET BY MOUTH TWICE A DAY   clopidogrel (PLAVIX) 75 MG tablet Take 1 tablet (75 mg total) by mouth daily.   dapagliflozin propanediol (FARXIGA) 10 MG TABS tablet Take 1 tablet (10 mg total) by mouth daily.   furosemide (LASIX) 40 MG tablet Take 1 tablet (40 mg total) by mouth 3 (three) times a week. M-W-F   nitroGLYCERIN (NITROSTAT) 0.4 MG SL tablet Place 1 tablet (0.4 mg total) under the tongue every 5 (five) minutes x 3 doses as needed for chest pain.   ondansetron (ZOFRAN-ODT) 4 MG disintegrating tablet Take 4 mg by mouth every 6 (six) hours as needed for nausea.   promethazine-dextromethorphan (PROMETHAZINE-DM) 6.25-15 MG/5ML syrup Take 5 mLs by mouth every 6 (six)  hours as needed for cough.   rosuvastatin (CRESTOR) 40 MG tablet Take 1 tablet (40 mg total) by mouth daily.   sacubitril-valsartan (ENTRESTO) 49-51 MG Take 1 tablet by mouth 2 (two) times daily.   sacubitril-valsartan (ENTRESTO) 49-51 MG Take 1 tablet by mouth 2 (two) times daily.   spironolactone (ALDACTONE) 25 MG tablet Take 0.5 tablets (12.5 mg total) by mouth daily.   VENTOLIN HFA 108 (90 Base) MCG/ACT inhaler Inhale 2 puffs into the lungs every 6 (six) hours as needed for wheezing or shortness of breath.      EKGs/Labs/Other Studies Reviewed:    The following studies were reviewed today:  Cardiac Studies & Procedures   CARDIAC CATHETERIZATION  CARDIAC CATHETERIZATION 09/22/2020  Narrative  RPDA lesion is 50% stenosed.  Dist Cx lesion is 30% stenosed with 30% stenosed side branch in LPAV.  1st Diag lesion is 60% stenosed.  Mid LAD lesion is 100% stenosed.  Post intervention, there is a 0% residual stenosis.  A drug-eluting stent was successfully placed using a STENT ONYX FRONTIER 2.5X38.  LV end diastolic pressure is mildly elevated.  1. Single  vessel occlusive CAD with 100% mid LAD occlusion 2. Mildly elevated LVEDP 21 mmHg 3. Successful PCI of the mid LAD with DES x 1  Plan: DAPT for one year. I suspect she will have significant myocardial injury given late presentation. Will check Echo. Aggressive risk factor modification.  Findings Coronary Findings Diagnostic  Dominance: Co-dominant  Left Anterior Descending Mid LAD lesion is 100% stenosed. The lesion is thrombotic.  First Diagonal Branch 1st Diag lesion is 60% stenosed.  Left Circumflex Dist Cx lesion is 30% stenosed with 30% stenosed side branch in LPAV.  Right Coronary Artery  Right Posterior Descending Artery RPDA lesion is 50% stenosed.  Intervention  Mid LAD lesion Stent CATH VISTA GUIDE 6FR XBLAD3.5 guide catheter was inserted. Lesion crossed with guidewire using a WIRE ASAHI PROWATER  180CM. Pre-stent angioplasty was performed using a BALLOON SAPPHIRE 2.0X12. A drug-eluting stent was successfully placed using a STENT ONYX FRONTIER 2.5X38. Stent strut is well apposed. Post-stent angioplasty was performed using a BALLOON SAPPHIRE [name] 3.0X15. Maximum pressure:  18 atm. Post-Intervention Lesion Assessment The intervention was successful. Pre-interventional TIMI flow is 0. Post-intervention TIMI flow is 3. No complications occurred at this lesion. There is a 0% residual stenosis post intervention.   STRESS TESTS  MYOCARDIAL PERFUSION IMAGING 12/10/2021  ECHOCARDIOGRAM  ECHOCARDIOGRAM COMPLETE 07/25/2021  Narrative ECHOCARDIOGRAM REPORT    Patient Name:   NAW LASALA Windt Date of Exam: 07/25/2021 Medical Rec #:  [id number]          Height:       64.0 in Accession #:    [id number]         Weight:       150.0 lb Date of Birth:  Jun 05, 1960          BSA:          1.731 m Patient Age:    60 years           BP:           128/64 mmHg Patient Gender: F                  HR:           73 bpm. Exam Location:  [place]  Procedure: 2D Echo, Cardiac Doppler, Color Doppler and Strain Analysis  Indications:    Coronary artery disease of native artery of native heart with stable angina pectoris (HCC) [I25.118 (ICD-10-CM)]; Ischemic cardiomyopathy [I25.5 (ICD-10-CM)]; Essential hypertension [I10 (ICD-10-CM)]; Hyperlipidemia LDL goal <70 [E78.5 (ICD-10-CM)]; History of CVA (cerebrovascular accident) [Z86.73 (ICD-10-CM)]  History:        Patient has prior history of Echocardiogram examinations, most recent 09/28/2020. Cardiomyopathy and CHF, Previous Myocardial Infarction, Stroke; Risk Factors:Hypertension and Dyslipidemia.  Sonographer:    Margreta Journey RDCS Referring Phys: [id number] Salote Weidmann J Edder Bellanca  IMPRESSIONS   1. Akinetic anteroseptum. Left ventricular ejection fraction, by estimation, is 30 to 35%. The left ventricle has moderately decreased function. The left ventricle  demonstrates regional wall motion abnormalities. 2. Right ventricular systolic function is normal. The right ventricular size is normal. There is normal pulmonary artery systolic pressure. 3. Left atrial size was mildly dilated. 4. The mitral valve is normal in structure. Moderate mitral valve regurgitation. No evidence of mitral stenosis. 5. The aortic valve is normal in structure. Aortic valve regurgitation is not visualized. Aortic valve sclerosis/calcification is present, without any evidence of aortic stenosis. 6. The inferior vena cava is normal in size with greater than 50% respiratory variability, suggesting right atrial pressure  of 3 mmHg.  FINDINGS Left Ventricle: Akinetic anteroseptum. Left ventricular ejection fraction, by estimation, is 30 to 35%. The left ventricle has moderately decreased function. The left ventricle demonstrates regional wall motion abnormalities. The left ventricular internal cavity size was normal in size. There is no left ventricular hypertrophy. Left ventricular diastolic parameters are consistent with Grade III diastolic dysfunction (restrictive).  Right Ventricle: The right ventricular size is normal. No increase in right ventricular wall thickness. Right ventricular systolic function is normal. There is normal pulmonary artery systolic pressure. The tricuspid regurgitant velocity is 2.34 m/s, and with an assumed right atrial pressure of 3 mmHg, the estimated right ventricular systolic pressure is 24.9 mmHg.  Left Atrium: Left atrial size was mildly dilated.  Right Atrium: Right atrial size was normal in size.  Pericardium: There is no evidence of pericardial effusion.  Mitral Valve: The mitral valve is normal in structure. Moderate mitral valve regurgitation. No evidence of mitral valve stenosis.  Tricuspid Valve: The tricuspid valve is normal in structure. Tricuspid valve regurgitation is not demonstrated. No evidence of tricuspid stenosis.  Aortic  Valve: The aortic valve is normal in structure. Aortic valve regurgitation is not visualized. Aortic valve sclerosis/calcification is present, without any evidence of aortic stenosis.  Pulmonic Valve: The pulmonic valve was normal in structure. Pulmonic valve regurgitation is not visualized. No evidence of pulmonic stenosis.  Aorta: The aortic root is normal in size and structure.  Venous: The inferior vena cava is normal in size with greater than 50% respiratory variability, suggesting right atrial pressure of 3 mmHg.  IAS/Shunts: No atrial level shunt detected by color flow Doppler.   LEFT VENTRICLE PLAX 2D LVIDd:         5.20 cm   Diastology LVIDs:         4.20 cm   LV e' medial:    3.71 cm/s LV PW:         1.10 cm   LV E/e' medial:  40.4 LV IVS:        0.80 cm   LV e' lateral:   5.50 cm/s LVOT diam:     1.60 cm   LV E/e' lateral: 27.3 LV SV:         42 LV SV Index:   24 LVOT Area:     2.01 cm   RIGHT VENTRICLE            IVC RV Basal diam:  2.50 cm    IVC diam: 1.30 cm RV S prime:     6.73 cm/s TAPSE (M-mode): 2.3 cm  LEFT ATRIUM           Index        RIGHT ATRIUM           Index LA diam:      3.60 cm 2.08 cm/m   RA Area:     11.60 cm LA Vol (A2C): 44.7 ml 25.82 ml/m  RA Volume:   25.50 ml  14.73 ml/m LA Vol (A4C): 49.0 ml 28.30 ml/m AORTIC VALVE LVOT Vmax:   109.00 cm/s LVOT Vmean:  73.200 cm/s LVOT VTI:    0.207 m  AORTA Ao Root diam: 2.50 cm Ao Asc diam:  2.80 cm Ao Desc diam: 1.70 cm  MITRAL VALVE                  TRICUSPID VALVE MV Area (PHT): 4.36 cm       TR Peak grad:   21.9 mmHg MV Decel  Time: 174 msec       TR Vmax:        234.00 cm/s MR Peak grad:    66.9 mmHg MR Mean grad:    50.0 mmHg    SHUNTS MR Vmax:         409.00 cm/s  Systemic VTI:  0.21 m MR Vmean:        337.0 cm/s   Systemic Diam: 1.60 cm MR PISA:         1.01 cm MR PISA Eff ROA: 10 mm MR PISA Radius:  0.40 cm MV E velocity: 150.00 cm/s MV A velocity: 27.70 cm/s MV E/A ratio:   5.42  Belva Crome MD Electronically signed by Belva Crome MD Signature Date/Time: 07/25/2021/1:02:58 PM    Final  TEE  ECHO TEE 10/02/2020  Narrative TRANSESOPHOGEAL ECHO REPORT    Patient Name:   [name] KOTTER Geraci Date of Exam: 10/02/2020 Medical Rec #:  [id number]          Height:       64.0 in Accession #:    [id number]         Weight:       154.5 lb Date of Birth:  Sep 01, 1960          BSA:          1.753 m Patient Age:    59 years           BP:           147/70 mmHg Patient Gender: F                  HR:           75 bpm. Exam Location:  Inpatient  Procedure: Transesophageal Echo, 3D Echo, Cardiac Doppler, Color Doppler and Strain Analysis  Indications:    Cerebral Infarction, unspecified I63.9  History:        Patient has prior history of Echocardiogram examinations, most recent 09/28/2020. CHF; Risk Factors:Diabetes, Dyslipidemia and Current Smoker.  Sonographer:    Leta Jungling RDCS Referring Phys: 7 Marge Vandermeulen S CRENSHAW  PROCEDURE: After discussion of the risks and benefits of a TEE, an informed consent was obtained from the patient. TEE procedure time was 11 minutes. The transesophogeal probe was passed without difficulty through the esophogus of the patient. Imaged were obtained with the patient in a left lateral decubitus position. Sedation performed by different physician. The patient was monitored while under deep sedation. Image quality was good. The patient's vital signs; including heart rate, blood pressure, and oxygen saturation; remained stable throughout the procedure. The patient developed no complications during the procedure.  IMPRESSIONS   1. Akinesis of the anteroseptal wall and apex with overall moderate to severe LV dysfunction; no obvious apical thrombus noted using definity. 2. Left ventricular ejection fraction, by estimation, is 30 to 35%. The left ventricle has moderate to severely decreased function. The left ventricle demonstrates  regional wall motion abnormalities (see scoring diagram/findings for description). The left ventricular internal cavity size was mildly dilated. 3. Right ventricular systolic function is normal. The right ventricular size is normal. 4. Left atrial size was mildly dilated. No left atrial/left atrial appendage thrombus was detected. 5. A small pericardial effusion is present. 6. The mitral valve is normal in structure. Mild mitral valve regurgitation. 7. The aortic valve is tricuspid. Aortic valve regurgitation is not visualized. 8. There is mild (Grade II) plaque involving the descending aorta. 9. Agitated saline contrast bubble study was positive with  shunting observed after >6 cardiac cycles suggestive of intrapulmonary shunting.  FINDINGS Left Ventricle: Left ventricular ejection fraction, by estimation, is 30 to 35%. The left ventricle has moderate to severely decreased function. The left ventricle demonstrates regional wall motion abnormalities. The left ventricular internal cavity size was mildly dilated.  Right Ventricle: The right ventricular size is normal. Right ventricular systolic function is normal.  Left Atrium: Left atrial size was mildly dilated. No left atrial/left atrial appendage thrombus was detected.  Right Atrium: Right atrial size was normal in size.  Pericardium: A small pericardial effusion is present.  Mitral Valve: The mitral valve is normal in structure. Mild mitral valve regurgitation.  Tricuspid Valve: The tricuspid valve is normal in structure. Tricuspid valve regurgitation is trivial.  Aortic Valve: The aortic valve is tricuspid. Aortic valve regurgitation is not visualized.  Pulmonic Valve: The pulmonic valve was normal in structure. Pulmonic valve regurgitation is not visualized.  Aorta: The aortic root is normal in size and structure. There is mild (Grade II) plaque involving the descending aorta.  IAS/Shunts: No atrial level shunt detected by color  flow Doppler. Agitated saline contrast bubble study was positive with shunting observed after >6 cardiac cycles suggestive of intrapulmonary shunting.  Additional Comments: Akinesis of the anteroseptal wall and apex with overall moderate to severe LV dysfunction; no obvious apical thrombus noted using definity.    3D Volume EF LV 3D EDV:   115.45 ml LV 3D ESV:   76.35 ml   AORTA Ao Root diam: 2.50 cm Ao Asc diam:  2.70 cm  MITRAL VALVE MV Area (plan): 5.03 cm  Olga Millers MD Electronically signed by Olga Millers MD Signature Date/Time: 10/02/2020/10:16:20 AM    Final  MONITORS  LONG TERM MONITOR (3-14 DAYS) 12/31/2020  Narrative  Normal sinus rhythm  Rare PACs with 8 brief runs of PAT. longest 12 beats  One 4 beat run NSVT  Symptoms on one occasion during sinus tachycardia   Patch Wear Time:  13 days and 16 hours (2022-09-19T15:07:12-398 to 2022-10-03T07:13:56-0400)  Patient had a min HR of 51 bpm, max HR of 184 bpm, and avg HR of 79 bpm. Predominant underlying rhythm was Sinus Rhythm. 1 run of Ventricular Tachycardia occurred lasting 4 beats with a max rate of 148 bpm (avg 143 bpm). 8 Supraventricular Tachyca rdia runs occurred, the run with the fastest interval lasting 12 beats with a max rate of 184 bpm, the longest lasting 12 beats with an avg rate of 148 bpm. Isolated SVEs were rare (<1.0%), SVE Couplets were rare (<1.0%), and SVE Triplets were rare (<1.0%). Isolated VEs were rare (<1.0%), VE Couplets were rare (<1.0%), and no VE Triplets were present. Ventricular Bigeminy and Trigeminy were present.               Recent Labs: 07/31/2022: BUN 21; Creatinine, Ser 1.63; Potassium 4.4; Sodium 138  Recent Lipid Panel    Component Value Date/Time   CHOL 268 (H) 01/07/2022 1620   TRIG 152 (H) 01/07/2022 1620   HDL 49 01/07/2022 1620   CHOLHDL 5.5 (H) 01/07/2022 1620   CHOLHDL 5.2 09/28/2020 0404   VLDL 20 09/28/2020 0404   LDLCALC 191 (H) 01/07/2022  1620    Physical Exam:    VS:  BP 126/72   Pulse 60   Ht 5\' 3"  (1.6 m)   Wt 165 lb 6.4 oz (75 kg)   SpO2 96%   BMI 29.30 kg/m     Wt Readings from Last 3 Encounters:  04/24/23 165 lb 6.4 oz (75 kg)  01/22/23 155 lb (70.3 kg)  07/18/22 161 lb 12.8 oz (73.4 kg)     GEN:  Well nourished, well developed in no acute distress HEENT: Normal NECK: No JVD; No carotid bruits LYMPHATICS: No lymphadenopathy CARDIAC: RRR, no murmurs, rubs, gallops RESPIRATORY:  Clear to auscultation without rales, wheezing or rhonchi  ABDOMEN: Soft, non-tender, non-distended MUSCULOSKELETAL:  No edema; No deformity  SKIN: Warm and dry NEUROLOGIC:  Alert and oriented x 3 PSYCHIATRIC:  Normal affect    Signed, Norman Herrlich, MD  04/24/2023 1:18 PM     [facility]

## 2023-04-24 ENCOUNTER — Ambulatory Visit: Payer: 59 | Attending: Cardiology | Admitting: Cardiology

## 2023-04-24 ENCOUNTER — Encounter: Payer: Self-pay | Admitting: Cardiology

## 2023-04-24 VITALS — BP 126/72 | HR 60 | Ht 63.0 in | Wt 165.4 lb

## 2023-04-24 DIAGNOSIS — Z95810 Presence of automatic (implantable) cardiac defibrillator: Secondary | ICD-10-CM

## 2023-04-24 DIAGNOSIS — I25118 Atherosclerotic heart disease of native coronary artery with other forms of angina pectoris: Secondary | ICD-10-CM

## 2023-04-24 DIAGNOSIS — E782 Mixed hyperlipidemia: Secondary | ICD-10-CM

## 2023-04-24 DIAGNOSIS — I5022 Chronic systolic (congestive) heart failure: Secondary | ICD-10-CM

## 2023-04-24 DIAGNOSIS — I255 Ischemic cardiomyopathy: Secondary | ICD-10-CM | POA: Diagnosis not present

## 2023-04-24 DIAGNOSIS — I130 Hypertensive heart and chronic kidney disease with heart failure and stage 1 through stage 4 chronic kidney disease, or unspecified chronic kidney disease: Secondary | ICD-10-CM | POA: Diagnosis not present

## 2023-04-24 NOTE — Patient Instructions (Signed)
 Medication Instructions:  Your physician recommends that you continue on your current medications as directed. Please refer to the Current Medication list given to you today.  *If you need a refill on your cardiac medications before your next appointment, please call your pharmacy*   Lab Work: None If you have labs (blood work) drawn today and your tests are completely normal, you will receive your results only by: MyChart Message (if you have MyChart) OR A paper copy in the mail If you have any lab test that is abnormal or we need to change your treatment, we will call you to review the results.   Testing/Procedures: None   Follow-Up: At [facility], you and your health needs are our priority.  As part of our continuing mission to provide you with exceptional heart care, we have created designated Provider Care Teams.  These Care Teams include your primary Cardiologist (physician) and Advanced Practice Providers (APPs -  Physician Assistants and Nurse Practitioners) who all work together to provide you with the care you need, when you need it.  We recommend signing up for the patient portal called "MyChart".  Sign up information is provided on this After Visit Summary.  MyChart is used to connect with patients for Virtual Visits (Telemedicine).  Patients are able to view lab/test results, encounter notes, upcoming appointments, etc.  Non-urgent messages can be sent to your provider as well.   To learn more about what you can do with MyChart, go to [email or web address].    Your next appointment:   6 month(s)  Provider:   Norman Herrlich, MD    Other Instructions None

## 2023-05-04 ENCOUNTER — Encounter: Payer: Self-pay | Admitting: Cardiology

## 2023-05-04 ENCOUNTER — Ambulatory Visit: Payer: 59 | Attending: Cardiology | Admitting: Cardiology

## 2023-05-04 VITALS — BP 140/70 | HR 60 | Ht 63.0 in | Wt 164.0 lb

## 2023-05-04 DIAGNOSIS — I5022 Chronic systolic (congestive) heart failure: Secondary | ICD-10-CM

## 2023-05-04 DIAGNOSIS — I2510 Atherosclerotic heart disease of native coronary artery without angina pectoris: Secondary | ICD-10-CM

## 2023-05-04 DIAGNOSIS — I10 Essential (primary) hypertension: Secondary | ICD-10-CM | POA: Diagnosis not present

## 2023-05-04 LAB — CUP PACEART INCLINIC DEVICE CHECK
Battery Remaining Longevity: 112 mo
Brady Statistic RV Percent Paced: 0 %
Date Time Interrogation Session: 20250210133340
HighPow Impedance: 72 Ohm
Implantable Lead Connection Status: 753985
Implantable Lead Implant Date: 20231003
Implantable Lead Location: 753860
Implantable Pulse Generator Implant Date: 20231003
Lead Channel Impedance Value: 550 Ohm
Lead Channel Pacing Threshold Amplitude: 0.5 V
Lead Channel Pacing Threshold Amplitude: 0.5 V
Lead Channel Pacing Threshold Pulse Width: 0.5 ms
Lead Channel Pacing Threshold Pulse Width: 0.5 ms
Lead Channel Sensing Intrinsic Amplitude: 11.8 mV
Lead Channel Setting Pacing Amplitude: 2.5 V
Lead Channel Setting Pacing Pulse Width: 0.5 ms
Lead Channel Setting Sensing Sensitivity: 0.5 mV
Pulse Gen Serial Number: 211008330
Zone Setting Status: 755011

## 2023-05-04 NOTE — Progress Notes (Signed)
  Electrophysiology Office Note:   Date:  05/04/2023  ID:  Tracey Meyer, DOB 1960-06-07, MRN [id number]  Primary Cardiologist: Zoe Hinds, MD Primary Heart Failure: None Electrophysiologist: Zacharee Gaddie Cortland Ding, MD      History of Present Illness:   Tracey Meyer is a 63 y.o. female with h/o coronary artery disease post LAD stent, carotid artery disease, CVA, hypertension, hyperlipidemia, chronic systolic heart failure seen today for routine electrophysiology followup.   Since last being seen in our clinic the patient reports doing overall well.  She has no chest pain or shortness of breath.  Stable to all of her daily activities without restriction.  She is happy with her control.  She has been as active as she can.  She is in a wheelchair today.  she denies chest pain, palpitations, dyspnea, PND, orthopnea, nausea, vomiting, dizziness, syncope, edema, weight gain, or early satiety.   Review of systems complete and found to be negative unless listed in HPI.      EP Information / Studies Reviewed:    EKG is not ordered today. EKG from 01/22/2023 reviewed which showed sinus rhythm, LVH      ICD Interrogation-  reviewed in detail today,  See PACEART report.  Device History: Abbott Single Chamber ICD implanted 12/24/2021 for chronic systolic heart failure History of appropriate therapy: No History of AAD therapy: No   Risk Assessment/Calculations:             Physical Exam:   VS:  BP (!) 140/70   Pulse 60   Ht 5\' 3"  (1.6 m)   Wt 164 lb (74.4 kg)   SpO2 99%   BMI 29.05 kg/m    Wt Readings from Last 3 Encounters:  05/04/23 164 lb (74.4 kg)  04/24/23 165 lb 6.4 oz (75 kg)  01/22/23 155 lb (70.3 kg)     GEN: Well nourished, well developed in no acute distress NECK: No JVD; No carotid bruits CARDIAC: Regular rate and rhythm, no murmurs, rubs, gallops RESPIRATORY:  Clear to auscultation without rales, wheezing or rhonchi  ABDOMEN: Soft, non-tender,  non-distended EXTREMITIES:  No edema; No deformity   ASSESSMENT AND PLAN:    Chronic systolic dysfunction s/p Abbott single chamber ICD  euvolemic today Stable on an appropriate medical regimen Normal ICD function See Pace Art report No changes today  2.  Coronary artery disease: Currently on aspirin  and Plavix .  No current chest pain.  Plan per primary cardiology.  3.  Hyperlipidemia: Goal LDL less than 70.  Continue Crestor  40 mg per primary cardiology  4.  Hypertension: Blood pressure elevated today.  Has been at goal on prior checks.  No changes at this time.  Disposition:   Follow up with Dr. Lawana Pray in 12 months   Signed, Lamya Lausch Cortland Ding, MD

## 2023-05-05 NOTE — Addendum Note (Signed)
Addended by: Geralyn Flash D on: 05/05/2023 09:54 AM   Modules accepted: Orders

## 2023-05-05 NOTE — Progress Notes (Signed)
Remote ICD transmission.

## 2023-05-12 ENCOUNTER — Other Ambulatory Visit: Payer: Self-pay

## 2023-05-12 DIAGNOSIS — I255 Ischemic cardiomyopathy: Secondary | ICD-10-CM

## 2023-05-12 DIAGNOSIS — I25118 Atherosclerotic heart disease of native coronary artery with other forms of angina pectoris: Secondary | ICD-10-CM

## 2023-05-12 MED ORDER — NITROGLYCERIN 0.4 MG SL SUBL: 0.4000 mg | SUBLINGUAL_TABLET | SUBLINGUAL | 2 refills | Status: DC | PRN

## 2023-05-12 MED ORDER — ROSUVASTATIN CALCIUM 40 MG PO TABS: 40.0000 mg | ORAL_TABLET | Freq: Every day | ORAL | 3 refills | Status: AC

## 2023-06-23 ENCOUNTER — Ambulatory Visit (INDEPENDENT_AMBULATORY_CARE_PROVIDER_SITE_OTHER): Payer: Medicaid Other

## 2023-06-23 DIAGNOSIS — I255 Ischemic cardiomyopathy: Secondary | ICD-10-CM

## 2023-06-23 LAB — CUP PACEART REMOTE DEVICE CHECK
Battery Remaining Longevity: 109 mo
Battery Remaining Percentage: 87 %
Battery Voltage: 3.02 V
Brady Statistic RV Percent Paced: 1 %
Date Time Interrogation Session: 20250401030137
HighPow Impedance: 69 Ohm
Implantable Lead Connection Status: 753985
Implantable Lead Implant Date: 20231003
Implantable Lead Location: 753860
Implantable Pulse Generator Implant Date: 20231003
Lead Channel Impedance Value: 550 Ohm
Lead Channel Pacing Threshold Amplitude: 0.5 V
Lead Channel Pacing Threshold Pulse Width: 0.5 ms
Lead Channel Sensing Intrinsic Amplitude: 11.8 mV
Lead Channel Setting Pacing Amplitude: 2.5 V
Lead Channel Setting Pacing Pulse Width: 0.5 ms
Lead Channel Setting Sensing Sensitivity: 0.5 mV
Pulse Gen Serial Number: 211008330
Zone Setting Status: 755011

## 2023-08-05 NOTE — Progress Notes (Signed)
 Remote ICD transmission.

## 2023-09-22 ENCOUNTER — Ambulatory Visit (INDEPENDENT_AMBULATORY_CARE_PROVIDER_SITE_OTHER): Payer: Self-pay

## 2023-09-22 DIAGNOSIS — I255 Ischemic cardiomyopathy: Secondary | ICD-10-CM | POA: Diagnosis not present

## 2023-09-22 LAB — CUP PACEART REMOTE DEVICE CHECK
Battery Remaining Longevity: 104 mo
Battery Remaining Percentage: 86 %
Battery Voltage: 3.02 V
Brady Statistic RV Percent Paced: 1 %
Date Time Interrogation Session: 20250701020158
HighPow Impedance: 68 Ohm
Implantable Lead Connection Status: 753985
Implantable Lead Implant Date: 20231003
Implantable Lead Location: 753860
Implantable Pulse Generator Implant Date: 20231003
Lead Channel Impedance Value: 560 Ohm
Lead Channel Pacing Threshold Amplitude: 0.5 V
Lead Channel Pacing Threshold Pulse Width: 0.5 ms
Lead Channel Sensing Intrinsic Amplitude: 11.8 mV
Lead Channel Setting Pacing Amplitude: 2.5 V
Lead Channel Setting Pacing Pulse Width: 0.5 ms
Lead Channel Setting Sensing Sensitivity: 0.5 mV
Pulse Gen Serial Number: 211008330
Zone Setting Status: 755011

## 2023-09-28 ENCOUNTER — Ambulatory Visit: Payer: Self-pay | Admitting: Cardiology

## 2023-10-19 ENCOUNTER — Telehealth: Payer: Self-pay | Admitting: Cardiology

## 2023-10-19 MED ORDER — NITROGLYCERIN 0.4 MG SL SUBL: 0.4000 mg | SUBLINGUAL_TABLET | SUBLINGUAL | 2 refills | Status: AC | PRN

## 2023-10-19 MED ORDER — CLOPIDOGREL BISULFATE 75 MG PO TABS: 75.0000 mg | ORAL_TABLET | Freq: Every day | ORAL | 3 refills | Status: AC

## 2023-10-19 MED ORDER — DAPAGLIFLOZIN PROPANEDIOL 10 MG PO TABS: 10.0000 mg | ORAL_TABLET | Freq: Every day | ORAL | 3 refills | Status: AC

## 2023-10-19 NOTE — Telephone Encounter (Signed)
*  STAT* If patient is at the pharmacy, call can be transferred to refill team.   1. Which medications need to be refilled? (please list name of each medication and dose if known)  dapagliflozin  propanediol (FARXIGA ) 10 MG TABS tablet  clopidogrel  (PLAVIX ) 75 MG tablet  nitroGLYCERIN  (NITROSTAT ) 0.4 MG SL tablet    2. Would you like to learn more about the convenience, safety, & potential cost savings by using the [facility]?     3. Are you open to using the [facility] (Type [facility].  ).   4. Which pharmacy/location (including street and city if local pharmacy) is medication to be sent to? [id number] - [place], [place] - [address]    5. Do they need a 30 day or 90 day supply? 90 day

## 2023-10-19 NOTE — Telephone Encounter (Signed)
 Meds sent

## 2023-12-22 ENCOUNTER — Ambulatory Visit (INDEPENDENT_AMBULATORY_CARE_PROVIDER_SITE_OTHER): Payer: Self-pay

## 2023-12-22 DIAGNOSIS — I255 Ischemic cardiomyopathy: Secondary | ICD-10-CM

## 2023-12-23 NOTE — Progress Notes (Signed)
 Remote ICD Transmission

## 2023-12-24 ENCOUNTER — Ambulatory Visit: Payer: Self-pay | Admitting: Cardiology

## 2023-12-24 LAB — CUP PACEART REMOTE DEVICE CHECK
Battery Remaining Longevity: 103 mo
Battery Remaining Percentage: 84 %
Battery Voltage: 3.02 V
Brady Statistic RV Percent Paced: 1 %
Date Time Interrogation Session: 20250930020211
HighPow Impedance: 61 Ohm
Implantable Lead Connection Status: 753985
Implantable Lead Implant Date: 20231003
Implantable Lead Location: 753860
Implantable Pulse Generator Implant Date: 20231003
Lead Channel Impedance Value: 450 Ohm
Lead Channel Pacing Threshold Amplitude: 0.5 V
Lead Channel Pacing Threshold Pulse Width: 0.5 ms
Lead Channel Sensing Intrinsic Amplitude: 11.8 mV
Lead Channel Setting Pacing Amplitude: 2.5 V
Lead Channel Setting Pacing Pulse Width: 0.5 ms
Lead Channel Setting Sensing Sensitivity: 0.5 mV
Pulse Gen Serial Number: 211008330
Zone Setting Status: 755011

## 2023-12-24 NOTE — Progress Notes (Signed)
 Remote ICD Transmission
